# Patient Record
Sex: Female | Born: 1945 | Race: White | Hispanic: No | Smoking: Current every day smoker
Health system: Southern US, Community
[De-identification: ages and names within clinical notes are randomized; demographics above are authoritative.]

## PROBLEM LIST (undated history)

## (undated) DIAGNOSIS — M545 Low back pain, unspecified: Secondary | ICD-10-CM

## (undated) DIAGNOSIS — E039 Hypothyroidism, unspecified: Secondary | ICD-10-CM

## (undated) DIAGNOSIS — I10 Essential (primary) hypertension: Secondary | ICD-10-CM

## (undated) DIAGNOSIS — M79645 Pain in left finger(s): Secondary | ICD-10-CM

## (undated) DIAGNOSIS — K635 Polyp of colon: Secondary | ICD-10-CM

## (undated) DIAGNOSIS — H409 Unspecified glaucoma: Secondary | ICD-10-CM

## (undated) DIAGNOSIS — I493 Ventricular premature depolarization: Secondary | ICD-10-CM

## (undated) DIAGNOSIS — M1712 Unilateral primary osteoarthritis, left knee: Secondary | ICD-10-CM

## (undated) DIAGNOSIS — E041 Nontoxic single thyroid nodule: Secondary | ICD-10-CM

## (undated) DIAGNOSIS — G8929 Other chronic pain: Secondary | ICD-10-CM

## (undated) DIAGNOSIS — K222 Esophageal obstruction: Secondary | ICD-10-CM

## (undated) DIAGNOSIS — K5900 Constipation, unspecified: Secondary | ICD-10-CM

## (undated) DIAGNOSIS — T7840XA Allergy, unspecified, initial encounter: Secondary | ICD-10-CM

## (undated) DIAGNOSIS — Z5189 Encounter for other specified aftercare: Secondary | ICD-10-CM

## (undated) DIAGNOSIS — J449 Chronic obstructive pulmonary disease, unspecified: Secondary | ICD-10-CM

## (undated) DIAGNOSIS — Z9889 Other specified postprocedural states: Secondary | ICD-10-CM

## (undated) DIAGNOSIS — G4730 Sleep apnea, unspecified: Secondary | ICD-10-CM

## (undated) DIAGNOSIS — Z86010 Personal history of colonic polyps: Secondary | ICD-10-CM

## (undated) DIAGNOSIS — H269 Unspecified cataract: Secondary | ICD-10-CM

## (undated) DIAGNOSIS — Z720 Tobacco use: Secondary | ICD-10-CM

## (undated) DIAGNOSIS — R011 Cardiac murmur, unspecified: Secondary | ICD-10-CM

## (undated) DIAGNOSIS — M1990 Unspecified osteoarthritis, unspecified site: Secondary | ICD-10-CM

## (undated) DIAGNOSIS — K219 Gastro-esophageal reflux disease without esophagitis: Secondary | ICD-10-CM

## (undated) DIAGNOSIS — E785 Hyperlipidemia, unspecified: Secondary | ICD-10-CM

## (undated) DIAGNOSIS — R112 Nausea with vomiting, unspecified: Secondary | ICD-10-CM

## (undated) DIAGNOSIS — M810 Age-related osteoporosis without current pathological fracture: Secondary | ICD-10-CM

## (undated) DIAGNOSIS — C801 Malignant (primary) neoplasm, unspecified: Secondary | ICD-10-CM

## (undated) DIAGNOSIS — Z8679 Personal history of other diseases of the circulatory system: Secondary | ICD-10-CM

## (undated) HISTORY — PX: POLYPECTOMY: SHX149

## (undated) HISTORY — PX: FRACTURE SURGERY: SHX138

## (undated) HISTORY — DX: Hypothyroidism, unspecified: E03.9

## (undated) HISTORY — PX: TUBAL LIGATION: SHX77

## (undated) HISTORY — PX: LUMBAR FUSION: SHX111

## (undated) HISTORY — PX: BACK SURGERY: SHX140

## (undated) HISTORY — DX: Sleep apnea, unspecified: G47.30

## (undated) HISTORY — DX: Allergy, unspecified, initial encounter: T78.40XA

## (undated) HISTORY — PX: UPPER GASTROINTESTINAL ENDOSCOPY: SHX188

## (undated) HISTORY — DX: Esophageal obstruction: K22.2

## (undated) HISTORY — PX: ELBOW SURGERY: SHX618

## (undated) HISTORY — DX: Ventricular premature depolarization: I49.3

## (undated) HISTORY — DX: Encounter for other specified aftercare: Z51.89

## (undated) HISTORY — DX: Unspecified glaucoma: H40.9

## (undated) HISTORY — PX: INFUSION PUMP IMPLANTATION: SHX1824

## (undated) HISTORY — DX: Personal history of other diseases of the circulatory system: Z86.79

## (undated) HISTORY — DX: Low back pain: M54.5

## (undated) HISTORY — DX: Personal history of colonic polyps: Z86.010

## (undated) HISTORY — DX: Malignant (primary) neoplasm, unspecified: C80.1

## (undated) HISTORY — DX: Chronic obstructive pulmonary disease, unspecified: J44.9

## (undated) HISTORY — DX: Tobacco use: Z72.0

## (undated) HISTORY — DX: Other chronic pain: G89.29

## (undated) HISTORY — DX: Nontoxic single thyroid nodule: E04.1

## (undated) HISTORY — PX: KNEE ARTHROSCOPY: SHX127

## (undated) HISTORY — PX: JOINT REPLACEMENT: SHX530

## (undated) HISTORY — PX: SPINE SURGERY: SHX786

## (undated) HISTORY — DX: Low back pain, unspecified: M54.50

## (undated) HISTORY — DX: Unilateral primary osteoarthritis, left knee: M17.12

## (undated) HISTORY — DX: Essential (primary) hypertension: I10

## (undated) HISTORY — PX: BREAST BIOPSY: SHX20

## (undated) HISTORY — PX: COLONOSCOPY: SHX174

## (undated) HISTORY — PX: ABDOMINAL HYSTERECTOMY: SHX81

## (undated) HISTORY — DX: Pain in left finger(s): M79.645

## (undated) HISTORY — DX: Polyp of colon: K63.5

## (undated) HISTORY — DX: Age-related osteoporosis without current pathological fracture: M81.0

## (undated) HISTORY — DX: Hyperlipidemia, unspecified: E78.5

## (undated) HISTORY — DX: Unspecified cataract: H26.9

## (undated) HISTORY — PX: EYE SURGERY: SHX253

---

## 1986-07-09 HISTORY — PX: ABDOMINAL HYSTERECTOMY: SHX81

## 1989-03-09 DIAGNOSIS — G4730 Sleep apnea, unspecified: Secondary | ICD-10-CM

## 1989-03-09 HISTORY — PX: INFUSION PUMP IMPLANTATION: SHX1824

## 1989-03-09 HISTORY — DX: Sleep apnea, unspecified: G47.30

## 1989-03-09 HISTORY — PX: ELBOW SURGERY: SHX618

## 1993-07-09 HISTORY — PX: ANKLE SURGERY: SHX546

## 1999-04-25 ENCOUNTER — Other Ambulatory Visit: Admission: RE | Admit: 1999-04-25 | Discharge: 1999-04-25 | Payer: Self-pay | Admitting: Obstetrics and Gynecology

## 2000-01-02 ENCOUNTER — Encounter: Payer: Self-pay | Admitting: Obstetrics and Gynecology

## 2000-01-02 ENCOUNTER — Encounter: Admission: RE | Admit: 2000-01-02 | Discharge: 2000-01-02 | Payer: Self-pay | Admitting: Obstetrics and Gynecology

## 2000-02-27 ENCOUNTER — Encounter: Payer: Self-pay | Admitting: Orthopedic Surgery

## 2000-02-27 ENCOUNTER — Ambulatory Visit (HOSPITAL_COMMUNITY): Admission: RE | Admit: 2000-02-27 | Discharge: 2000-02-27 | Payer: Self-pay | Admitting: Orthopedic Surgery

## 2000-03-13 ENCOUNTER — Ambulatory Visit (HOSPITAL_COMMUNITY): Admission: RE | Admit: 2000-03-13 | Discharge: 2000-03-13 | Payer: Self-pay | Admitting: Orthopedic Surgery

## 2000-03-13 ENCOUNTER — Encounter: Payer: Self-pay | Admitting: Orthopedic Surgery

## 2000-03-29 ENCOUNTER — Ambulatory Visit (HOSPITAL_COMMUNITY): Admission: RE | Admit: 2000-03-29 | Discharge: 2000-03-29 | Payer: Self-pay | Admitting: Orthopedic Surgery

## 2000-03-29 ENCOUNTER — Encounter: Payer: Self-pay | Admitting: Orthopedic Surgery

## 2000-05-27 ENCOUNTER — Other Ambulatory Visit: Admission: RE | Admit: 2000-05-27 | Discharge: 2000-05-27 | Payer: Self-pay | Admitting: Obstetrics and Gynecology

## 2000-09-30 ENCOUNTER — Ambulatory Visit (HOSPITAL_COMMUNITY): Admission: RE | Admit: 2000-09-30 | Discharge: 2000-09-30 | Payer: Self-pay | Admitting: Orthopaedic Surgery

## 2000-10-03 ENCOUNTER — Inpatient Hospital Stay (HOSPITAL_COMMUNITY): Admission: RE | Admit: 2000-10-03 | Discharge: 2000-10-04 | Payer: Self-pay | Admitting: Orthopaedic Surgery

## 2000-12-18 ENCOUNTER — Ambulatory Visit: Admission: RE | Admit: 2000-12-18 | Discharge: 2000-12-18 | Payer: Self-pay | Admitting: Orthopaedic Surgery

## 2001-01-01 ENCOUNTER — Encounter: Admission: RE | Admit: 2001-01-01 | Discharge: 2001-01-01 | Payer: Self-pay | Admitting: *Deleted

## 2001-01-16 ENCOUNTER — Encounter: Admission: RE | Admit: 2001-01-16 | Discharge: 2001-01-16 | Payer: Self-pay | Admitting: Orthopaedic Surgery

## 2001-01-29 ENCOUNTER — Encounter: Admission: RE | Admit: 2001-01-29 | Discharge: 2001-01-29 | Payer: Self-pay | Admitting: Orthopaedic Surgery

## 2001-02-03 ENCOUNTER — Encounter: Payer: Self-pay | Admitting: Obstetrics and Gynecology

## 2001-02-03 ENCOUNTER — Encounter: Admission: RE | Admit: 2001-02-03 | Discharge: 2001-02-03 | Payer: Self-pay | Admitting: Obstetrics and Gynecology

## 2001-06-02 ENCOUNTER — Other Ambulatory Visit: Admission: RE | Admit: 2001-06-02 | Discharge: 2001-06-02 | Payer: Self-pay | Admitting: Obstetrics and Gynecology

## 2001-08-27 ENCOUNTER — Encounter: Payer: Self-pay | Admitting: Endocrinology

## 2001-08-27 ENCOUNTER — Encounter: Admission: RE | Admit: 2001-08-27 | Discharge: 2001-08-27 | Payer: Self-pay | Admitting: Endocrinology

## 2001-09-19 ENCOUNTER — Inpatient Hospital Stay (HOSPITAL_COMMUNITY): Admission: RE | Admit: 2001-09-19 | Discharge: 2001-09-20 | Payer: Self-pay | Admitting: Orthopaedic Surgery

## 2001-10-30 ENCOUNTER — Ambulatory Visit (HOSPITAL_COMMUNITY): Admission: RE | Admit: 2001-10-30 | Discharge: 2001-10-30 | Payer: Self-pay | Admitting: Orthopaedic Surgery

## 2002-02-18 ENCOUNTER — Encounter: Admission: RE | Admit: 2002-02-18 | Discharge: 2002-02-18 | Payer: Self-pay | Admitting: Obstetrics and Gynecology

## 2002-02-18 ENCOUNTER — Encounter: Payer: Self-pay | Admitting: Obstetrics and Gynecology

## 2003-03-30 ENCOUNTER — Encounter: Payer: Self-pay | Admitting: Obstetrics and Gynecology

## 2003-03-30 ENCOUNTER — Encounter: Admission: RE | Admit: 2003-03-30 | Discharge: 2003-03-30 | Payer: Self-pay | Admitting: Obstetrics and Gynecology

## 2003-09-14 ENCOUNTER — Ambulatory Visit (HOSPITAL_COMMUNITY): Admission: RE | Admit: 2003-09-14 | Discharge: 2003-09-14 | Payer: Self-pay | Admitting: Neurosurgery

## 2004-04-20 ENCOUNTER — Ambulatory Visit: Payer: Self-pay | Admitting: Pain Medicine

## 2004-05-02 ENCOUNTER — Ambulatory Visit: Payer: Self-pay | Admitting: Physician Assistant

## 2004-05-09 ENCOUNTER — Ambulatory Visit: Payer: Self-pay | Admitting: Pain Medicine

## 2004-05-24 ENCOUNTER — Ambulatory Visit: Payer: Self-pay | Admitting: Physician Assistant

## 2004-06-28 ENCOUNTER — Ambulatory Visit: Payer: Self-pay | Admitting: Pain Medicine

## 2004-07-27 ENCOUNTER — Ambulatory Visit: Payer: Self-pay | Admitting: Physician Assistant

## 2004-08-28 ENCOUNTER — Ambulatory Visit (HOSPITAL_COMMUNITY): Admission: RE | Admit: 2004-08-28 | Discharge: 2004-08-28 | Payer: Self-pay | Admitting: Gastroenterology

## 2004-08-28 ENCOUNTER — Encounter: Payer: Self-pay | Admitting: Internal Medicine

## 2004-08-28 ENCOUNTER — Encounter (INDEPENDENT_AMBULATORY_CARE_PROVIDER_SITE_OTHER): Payer: Self-pay | Admitting: Specialist

## 2004-08-28 DIAGNOSIS — Z860100 Personal history of colon polyps, unspecified: Secondary | ICD-10-CM

## 2004-08-28 HISTORY — DX: Personal history of colon polyps, unspecified: Z86.0100

## 2004-08-28 HISTORY — DX: Personal history of colonic polyps: Z86.010

## 2004-08-28 LAB — CONVERTED CEMR LAB

## 2004-09-04 ENCOUNTER — Ambulatory Visit: Payer: Self-pay | Admitting: Pain Medicine

## 2004-10-02 ENCOUNTER — Ambulatory Visit: Payer: Self-pay | Admitting: Physician Assistant

## 2004-10-31 ENCOUNTER — Ambulatory Visit: Payer: Self-pay | Admitting: Physician Assistant

## 2004-11-03 ENCOUNTER — Ambulatory Visit (HOSPITAL_BASED_OUTPATIENT_CLINIC_OR_DEPARTMENT_OTHER): Admission: RE | Admit: 2004-11-03 | Discharge: 2004-11-03 | Payer: Self-pay | Admitting: Orthopedic Surgery

## 2004-11-03 ENCOUNTER — Ambulatory Visit (HOSPITAL_COMMUNITY): Admission: RE | Admit: 2004-11-03 | Discharge: 2004-11-03 | Payer: Self-pay | Admitting: Orthopedic Surgery

## 2004-11-30 ENCOUNTER — Ambulatory Visit: Payer: Self-pay | Admitting: Physician Assistant

## 2004-12-28 ENCOUNTER — Ambulatory Visit: Payer: Self-pay | Admitting: Physician Assistant

## 2005-01-25 ENCOUNTER — Ambulatory Visit: Payer: Self-pay | Admitting: Physician Assistant

## 2005-02-19 ENCOUNTER — Ambulatory Visit (HOSPITAL_COMMUNITY): Admission: RE | Admit: 2005-02-19 | Discharge: 2005-02-19 | Payer: Self-pay | Admitting: Neurology

## 2005-02-27 ENCOUNTER — Ambulatory Visit: Payer: Self-pay | Admitting: Physician Assistant

## 2005-03-29 ENCOUNTER — Ambulatory Visit: Payer: Self-pay | Admitting: Physician Assistant

## 2005-04-25 ENCOUNTER — Ambulatory Visit (HOSPITAL_COMMUNITY): Admission: RE | Admit: 2005-04-25 | Discharge: 2005-04-25 | Payer: Self-pay | Admitting: Endocrinology

## 2005-04-25 ENCOUNTER — Ambulatory Visit: Payer: Self-pay | Admitting: Physician Assistant

## 2005-04-27 ENCOUNTER — Ambulatory Visit: Payer: Self-pay | Admitting: Pain Medicine

## 2005-05-08 ENCOUNTER — Ambulatory Visit: Payer: Self-pay | Admitting: Pain Medicine

## 2005-05-23 ENCOUNTER — Ambulatory Visit: Payer: Self-pay | Admitting: Pain Medicine

## 2005-06-04 ENCOUNTER — Ambulatory Visit: Payer: Self-pay | Admitting: Pain Medicine

## 2005-06-04 ENCOUNTER — Inpatient Hospital Stay: Payer: Self-pay | Admitting: Pain Medicine

## 2005-06-19 ENCOUNTER — Ambulatory Visit: Payer: Self-pay | Admitting: Pain Medicine

## 2005-06-20 ENCOUNTER — Ambulatory Visit: Payer: Self-pay | Admitting: Physician Assistant

## 2005-06-28 ENCOUNTER — Ambulatory Visit: Payer: Self-pay | Admitting: Pain Medicine

## 2005-07-11 ENCOUNTER — Ambulatory Visit: Payer: Self-pay | Admitting: Pain Medicine

## 2005-07-25 ENCOUNTER — Ambulatory Visit: Payer: Self-pay | Admitting: Pain Medicine

## 2005-08-06 ENCOUNTER — Ambulatory Visit: Payer: Self-pay | Admitting: Physician Assistant

## 2005-09-24 ENCOUNTER — Ambulatory Visit: Payer: Self-pay | Admitting: Pain Medicine

## 2005-10-16 ENCOUNTER — Ambulatory Visit: Payer: Self-pay | Admitting: Physician Assistant

## 2005-10-22 ENCOUNTER — Ambulatory Visit: Payer: Self-pay | Admitting: Pain Medicine

## 2005-11-02 ENCOUNTER — Other Ambulatory Visit: Admission: RE | Admit: 2005-11-02 | Discharge: 2005-11-02 | Payer: Self-pay | Admitting: Endocrinology

## 2005-11-28 ENCOUNTER — Encounter: Admission: RE | Admit: 2005-11-28 | Discharge: 2005-11-28 | Payer: Self-pay | Admitting: Obstetrics and Gynecology

## 2006-01-25 ENCOUNTER — Ambulatory Visit: Payer: Self-pay | Admitting: Physician Assistant

## 2006-02-18 ENCOUNTER — Ambulatory Visit: Payer: Self-pay | Admitting: Physician Assistant

## 2006-03-01 ENCOUNTER — Ambulatory Visit: Payer: Self-pay | Admitting: Physician Assistant

## 2006-04-19 ENCOUNTER — Ambulatory Visit: Payer: Self-pay | Admitting: Physician Assistant

## 2006-05-09 ENCOUNTER — Ambulatory Visit: Payer: Self-pay | Admitting: Physician Assistant

## 2006-05-21 ENCOUNTER — Ambulatory Visit: Payer: Self-pay | Admitting: Pain Medicine

## 2006-05-28 ENCOUNTER — Ambulatory Visit: Payer: Self-pay | Admitting: Physician Assistant

## 2006-06-17 ENCOUNTER — Ambulatory Visit: Payer: Self-pay | Admitting: Physician Assistant

## 2006-08-28 ENCOUNTER — Ambulatory Visit: Payer: Self-pay | Admitting: Physician Assistant

## 2006-11-13 ENCOUNTER — Ambulatory Visit: Payer: Self-pay | Admitting: Physician Assistant

## 2006-12-05 ENCOUNTER — Encounter: Admission: RE | Admit: 2006-12-05 | Discharge: 2006-12-05 | Payer: Self-pay | Admitting: *Deleted

## 2007-08-12 ENCOUNTER — Ambulatory Visit: Payer: Self-pay | Admitting: Internal Medicine

## 2007-08-12 DIAGNOSIS — E785 Hyperlipidemia, unspecified: Secondary | ICD-10-CM | POA: Insufficient documentation

## 2007-08-12 DIAGNOSIS — I10 Essential (primary) hypertension: Secondary | ICD-10-CM | POA: Insufficient documentation

## 2007-08-12 DIAGNOSIS — M1710 Unilateral primary osteoarthritis, unspecified knee: Secondary | ICD-10-CM | POA: Insufficient documentation

## 2007-08-12 DIAGNOSIS — F17200 Nicotine dependence, unspecified, uncomplicated: Secondary | ICD-10-CM | POA: Insufficient documentation

## 2007-08-12 DIAGNOSIS — E039 Hypothyroidism, unspecified: Secondary | ICD-10-CM | POA: Insufficient documentation

## 2007-08-14 ENCOUNTER — Telehealth: Payer: Self-pay | Admitting: Internal Medicine

## 2007-08-18 LAB — HM MAMMOGRAPHY: HM Mammogram: NORMAL

## 2007-10-20 ENCOUNTER — Ambulatory Visit: Payer: Self-pay | Admitting: Internal Medicine

## 2007-10-20 LAB — CONVERTED CEMR LAB
ALT: 17 units/L (ref 0–35)
AST: 21 units/L (ref 0–37)
Albumin: 3.8 g/dL (ref 3.5–5.2)
Alkaline Phosphatase: 57 units/L (ref 39–117)
BUN: 21 mg/dL (ref 6–23)
Basophils Absolute: 0 10*3/uL (ref 0.0–0.1)
Basophils Relative: 0.5 % (ref 0.0–1.0)
Bilirubin, Direct: 0.1 mg/dL (ref 0.0–0.3)
CO2: 32 meq/L (ref 19–32)
CRP, High Sensitivity: 0.9
Calcium: 8.6 mg/dL (ref 8.4–10.5)
Chloride: 106 meq/L (ref 96–112)
Cholesterol: 165 mg/dL (ref 0–200)
Creatinine, Ser: 0.8 mg/dL (ref 0.4–1.2)
Eosinophils Absolute: 0.1 10*3/uL (ref 0.0–0.7)
Eosinophils Relative: 3.2 % (ref 0.0–5.0)
GFR calc Af Amer: 93 mL/min
GFR calc non Af Amer: 77 mL/min
Glucose, Bld: 99 mg/dL (ref 70–99)
HCT: 34.2 % — ABNORMAL LOW (ref 36.0–46.0)
HDL: 51.9 mg/dL (ref >39.0)
Hemoglobin: 11.4 g/dL — ABNORMAL LOW (ref 12.0–15.0)
LDL Cholesterol: 93 mg/dL (ref 0–99)
Lymphocytes Relative: 35.7 % (ref 12.0–46.0)
MCHC: 33.4 g/dL (ref 30.0–36.0)
MCV: 90.4 fL (ref 78.0–100.0)
Monocytes Absolute: 0.5 10*3/uL (ref 0.1–1.0)
Monocytes Relative: 11.9 % (ref 3.0–12.0)
Neutro Abs: 2 10*3/uL (ref 1.4–7.7)
Neutrophils Relative %: 48.7 % (ref 43.0–77.0)
Platelets: 192 10*3/uL (ref 150–400)
Potassium: 3.6 meq/L (ref 3.5–5.1)
RBC: 3.78 M/uL — ABNORMAL LOW (ref 3.87–5.11)
RDW: 12.7 % (ref 11.5–14.6)
Sodium: 142 meq/L (ref 135–145)
TSH: 2.2 microintl units/mL (ref 0.35–5.50)
Total Bilirubin: 0.5 mg/dL (ref 0.3–1.2)
Total CHOL/HDL Ratio: 3.2
Total Protein: 6.1 g/dL (ref 6.0–8.3)
Triglycerides: 102 mg/dL (ref 0–149)
VLDL: 20 mg/dL (ref 0–40)
WBC: 4.1 10*3/uL — ABNORMAL LOW (ref 4.5–10.5)

## 2007-10-21 ENCOUNTER — Encounter: Payer: Self-pay | Admitting: Internal Medicine

## 2007-10-22 ENCOUNTER — Telehealth: Payer: Self-pay | Admitting: Internal Medicine

## 2007-10-24 ENCOUNTER — Ambulatory Visit: Payer: Self-pay | Admitting: Internal Medicine

## 2007-10-24 DIAGNOSIS — D649 Anemia, unspecified: Secondary | ICD-10-CM | POA: Insufficient documentation

## 2007-11-13 ENCOUNTER — Telehealth: Payer: Self-pay | Admitting: Internal Medicine

## 2007-12-19 ENCOUNTER — Encounter: Admission: RE | Admit: 2007-12-19 | Discharge: 2007-12-19 | Payer: Self-pay | Admitting: Obstetrics and Gynecology

## 2007-12-25 ENCOUNTER — Encounter: Admission: RE | Admit: 2007-12-25 | Discharge: 2007-12-25 | Payer: Self-pay | Admitting: Obstetrics and Gynecology

## 2008-02-24 LAB — CONVERTED CEMR LAB: Pap Smear: NORMAL

## 2008-02-26 ENCOUNTER — Encounter: Admission: RE | Admit: 2008-02-26 | Discharge: 2008-02-26 | Payer: Self-pay | Admitting: Obstetrics and Gynecology

## 2008-05-06 ENCOUNTER — Ambulatory Visit: Payer: Self-pay | Admitting: Internal Medicine

## 2008-05-06 DIAGNOSIS — G4700 Insomnia, unspecified: Secondary | ICD-10-CM | POA: Insufficient documentation

## 2008-05-06 DIAGNOSIS — R1310 Dysphagia, unspecified: Secondary | ICD-10-CM | POA: Insufficient documentation

## 2008-05-06 LAB — CONVERTED CEMR LAB
ALT: 17 units/L (ref 0–35)
AST: 20 units/L (ref 0–37)
BUN: 18 mg/dL (ref 6–23)
Basophils Absolute: 0 10*3/uL (ref 0.0–0.1)
Basophils Relative: 0.5 % (ref 0.0–3.0)
CO2: 33 meq/L — ABNORMAL HIGH (ref 19–32)
Calcium: 9.2 mg/dL (ref 8.4–10.5)
Chloride: 102 meq/L (ref 96–112)
Cholesterol: 196 mg/dL (ref 0–200)
Creatinine, Ser: 0.8 mg/dL (ref 0.4–1.2)
Direct LDL: 78.9 mg/dL
Eosinophils Absolute: 0.1 10*3/uL (ref 0.0–0.7)
Eosinophils Relative: 3.3 % (ref 0.0–5.0)
GFR calc Af Amer: 93 mL/min
GFR calc non Af Amer: 77 mL/min
Glucose, Bld: 92 mg/dL (ref 70–99)
HCT: 35.7 % — ABNORMAL LOW (ref 36.0–46.0)
HDL: 50 mg/dL (ref >39.0)
Hemoglobin: 12.8 g/dL (ref 12.0–15.0)
Lymphocytes Relative: 35.4 % (ref 12.0–46.0)
MCHC: 35.9 g/dL (ref 30.0–36.0)
MCV: 90.2 fL (ref 78.0–100.0)
Monocytes Absolute: 0.5 10*3/uL (ref 0.1–1.0)
Monocytes Relative: 11.8 % (ref 3.0–12.0)
Neutro Abs: 2.3 10*3/uL (ref 1.4–7.7)
Neutrophils Relative %: 49 % (ref 43.0–77.0)
Platelets: 193 10*3/uL (ref 150–400)
Potassium: 4 meq/L (ref 3.5–5.1)
RBC: 3.96 M/uL (ref 3.87–5.11)
RDW: 12.7 % (ref 11.5–14.6)
Sodium: 141 meq/L (ref 135–145)
TSH: 2.16 microintl units/mL (ref 0.35–5.50)
Total CHOL/HDL Ratio: 3.9
Triglycerides: 309 mg/dL (ref 0–149)
VLDL: 62 mg/dL — ABNORMAL HIGH (ref 0–40)
WBC: 4.5 10*3/uL (ref 4.5–10.5)

## 2008-05-26 ENCOUNTER — Ambulatory Visit: Payer: Self-pay | Admitting: Internal Medicine

## 2008-05-26 DIAGNOSIS — K219 Gastro-esophageal reflux disease without esophagitis: Secondary | ICD-10-CM | POA: Insufficient documentation

## 2008-05-26 DIAGNOSIS — E041 Nontoxic single thyroid nodule: Secondary | ICD-10-CM | POA: Insufficient documentation

## 2008-07-14 ENCOUNTER — Ambulatory Visit: Payer: Self-pay | Admitting: Internal Medicine

## 2008-07-29 ENCOUNTER — Telehealth: Payer: Self-pay | Admitting: Internal Medicine

## 2008-08-02 ENCOUNTER — Ambulatory Visit: Payer: Self-pay | Admitting: Internal Medicine

## 2008-08-17 ENCOUNTER — Encounter: Admission: RE | Admit: 2008-08-17 | Discharge: 2008-08-17 | Payer: Self-pay | Admitting: Obstetrics and Gynecology

## 2008-08-27 ENCOUNTER — Ambulatory Visit: Payer: Self-pay | Admitting: Internal Medicine

## 2008-09-13 ENCOUNTER — Ambulatory Visit: Payer: Self-pay | Admitting: Internal Medicine

## 2008-09-13 DIAGNOSIS — M25569 Pain in unspecified knee: Secondary | ICD-10-CM | POA: Insufficient documentation

## 2008-09-13 DIAGNOSIS — L02838 Carbuncle of other sites: Secondary | ICD-10-CM

## 2008-09-13 DIAGNOSIS — L02828 Furuncle of other sites: Secondary | ICD-10-CM | POA: Insufficient documentation

## 2008-09-16 ENCOUNTER — Encounter: Payer: Self-pay | Admitting: Internal Medicine

## 2008-09-17 ENCOUNTER — Encounter: Payer: Self-pay | Admitting: Internal Medicine

## 2008-09-21 LAB — CONVERTED CEMR LAB: Pap Smear: NORMAL

## 2008-09-22 ENCOUNTER — Ambulatory Visit (HOSPITAL_BASED_OUTPATIENT_CLINIC_OR_DEPARTMENT_OTHER): Admission: RE | Admit: 2008-09-22 | Discharge: 2008-09-22 | Payer: Self-pay | Admitting: Orthopedic Surgery

## 2008-09-22 ENCOUNTER — Ambulatory Visit: Payer: Self-pay | Admitting: Diagnostic Radiology

## 2008-09-24 ENCOUNTER — Encounter: Payer: Self-pay | Admitting: Internal Medicine

## 2008-10-22 ENCOUNTER — Ambulatory Visit: Payer: Self-pay | Admitting: Internal Medicine

## 2008-11-05 ENCOUNTER — Telehealth: Payer: Self-pay | Admitting: Internal Medicine

## 2008-11-08 ENCOUNTER — Encounter: Admission: RE | Admit: 2008-11-08 | Discharge: 2008-11-08 | Payer: Self-pay | Admitting: Internal Medicine

## 2008-11-22 ENCOUNTER — Encounter: Payer: Self-pay | Admitting: Internal Medicine

## 2008-11-26 ENCOUNTER — Encounter: Payer: Self-pay | Admitting: Internal Medicine

## 2009-02-07 ENCOUNTER — Telehealth: Payer: Self-pay | Admitting: Internal Medicine

## 2009-02-07 ENCOUNTER — Ambulatory Visit: Payer: Self-pay | Admitting: Internal Medicine

## 2009-02-07 LAB — CONVERTED CEMR LAB
ALT: 17 units/L (ref 0–35)
AST: 20 units/L (ref 0–37)
Albumin: 4.1 g/dL (ref 3.5–5.2)
Alkaline Phosphatase: 76 units/L (ref 39–117)
BUN: 16 mg/dL (ref 6–23)
Bilirubin, Direct: 0.1 mg/dL (ref 0.0–0.3)
CO2: 31 meq/L (ref 19–32)
Calcium: 9 mg/dL (ref 8.4–10.5)
Chloride: 102 meq/L (ref 96–112)
Cholesterol: 146 mg/dL (ref 0–200)
Creatinine, Ser: 0.8 mg/dL (ref 0.4–1.2)
GFR calc non Af Amer: 76.69 mL/min (ref >60)
Glucose, Bld: 102 mg/dL — ABNORMAL HIGH (ref 70–99)
HDL: 51.6 mg/dL (ref >39.00)
LDL Cholesterol: 70 mg/dL (ref 0–99)
Potassium: 2.7 meq/L — CL (ref 3.5–5.1)
Sodium: 142 meq/L (ref 135–145)
TSH: 0.32 microintl units/mL — ABNORMAL LOW (ref 0.35–5.50)
Total Bilirubin: 0.7 mg/dL (ref 0.3–1.2)
Total CHOL/HDL Ratio: 3
Total Protein: 7 g/dL (ref 6.0–8.3)
Triglycerides: 121 mg/dL (ref 0.0–149.0)
VLDL: 24.2 mg/dL (ref 0.0–40.0)

## 2009-02-10 ENCOUNTER — Ambulatory Visit: Payer: Self-pay | Admitting: Internal Medicine

## 2009-02-11 ENCOUNTER — Telehealth: Payer: Self-pay | Admitting: Internal Medicine

## 2009-02-25 LAB — CONVERTED CEMR LAB
BUN: 14 mg/dL (ref 6–23)
CO2: 26 meq/L (ref 19–32)
Calcium: 9.3 mg/dL (ref 8.4–10.5)
Chloride: 106 meq/L (ref 96–112)
Creatinine, Ser: 0.74 mg/dL (ref 0.40–1.20)
Glucose, Bld: 95 mg/dL (ref 70–99)
Potassium: 4 meq/L (ref 3.5–5.3)
Sodium: 141 meq/L (ref 135–145)

## 2009-02-28 ENCOUNTER — Encounter: Admission: RE | Admit: 2009-02-28 | Discharge: 2009-02-28 | Payer: Self-pay | Admitting: Obstetrics and Gynecology

## 2009-03-01 ENCOUNTER — Telehealth: Payer: Self-pay | Admitting: Internal Medicine

## 2009-04-13 ENCOUNTER — Encounter: Admission: RE | Admit: 2009-04-13 | Discharge: 2009-04-13 | Payer: Self-pay | Admitting: Endocrinology

## 2009-04-20 ENCOUNTER — Ambulatory Visit: Payer: Self-pay | Admitting: Diagnostic Radiology

## 2009-04-20 ENCOUNTER — Ambulatory Visit (HOSPITAL_BASED_OUTPATIENT_CLINIC_OR_DEPARTMENT_OTHER): Admission: RE | Admit: 2009-04-20 | Discharge: 2009-04-20 | Payer: Self-pay | Admitting: Family

## 2009-04-20 ENCOUNTER — Ambulatory Visit: Payer: Self-pay | Admitting: Internal Medicine

## 2009-04-20 DIAGNOSIS — J209 Acute bronchitis, unspecified: Secondary | ICD-10-CM | POA: Insufficient documentation

## 2009-04-21 ENCOUNTER — Telehealth: Payer: Self-pay | Admitting: Internal Medicine

## 2009-04-27 ENCOUNTER — Telehealth: Payer: Self-pay | Admitting: Internal Medicine

## 2009-06-01 ENCOUNTER — Emergency Department (HOSPITAL_COMMUNITY): Admission: EM | Admit: 2009-06-01 | Discharge: 2009-06-01 | Payer: Self-pay | Admitting: Emergency Medicine

## 2009-06-08 ENCOUNTER — Ambulatory Visit: Payer: Self-pay | Admitting: Internal Medicine

## 2009-06-08 DIAGNOSIS — R634 Abnormal weight loss: Secondary | ICD-10-CM | POA: Insufficient documentation

## 2009-06-08 LAB — CONVERTED CEMR LAB
BUN: 15 mg/dL (ref 6–23)
BUN: 17 mg/dL (ref 6–23)
CO2: 28 meq/L (ref 19–32)
CO2: 28 meq/L (ref 19–32)
Calcium: 9.1 mg/dL (ref 8.4–10.5)
Calcium: 9.6 mg/dL (ref 8.4–10.5)
Chloride: 101 meq/L (ref 96–112)
Chloride: 102 meq/L (ref 96–112)
Creatinine, Ser: 0.7 mg/dL (ref 0.4–1.2)
Creatinine, Ser: 0.77 mg/dL (ref 0.40–1.20)
GFR calc non Af Amer: 89.38 mL/min (ref >60)
Glucose, Bld: 125 mg/dL — ABNORMAL HIGH (ref 70–99)
Glucose, Bld: 97 mg/dL (ref 70–99)
Potassium: 3.7 meq/L (ref 3.5–5.1)
Potassium: 3.8 meq/L (ref 3.5–5.3)
Sodium: 137 meq/L (ref 135–145)
Sodium: 139 meq/L (ref 135–145)
TSH: 0.37 microintl units/mL (ref 0.35–5.50)
TSH: 0.512 microintl units/mL (ref 0.350–4.500)

## 2009-06-09 ENCOUNTER — Ambulatory Visit (HOSPITAL_BASED_OUTPATIENT_CLINIC_OR_DEPARTMENT_OTHER): Admission: RE | Admit: 2009-06-09 | Discharge: 2009-06-09 | Payer: Self-pay | Admitting: Internal Medicine

## 2009-06-09 ENCOUNTER — Telehealth: Payer: Self-pay | Admitting: Internal Medicine

## 2009-06-09 ENCOUNTER — Ambulatory Visit: Payer: Self-pay | Admitting: Diagnostic Radiology

## 2009-06-28 ENCOUNTER — Ambulatory Visit: Payer: Self-pay | Admitting: Diagnostic Radiology

## 2009-06-28 ENCOUNTER — Ambulatory Visit (HOSPITAL_BASED_OUTPATIENT_CLINIC_OR_DEPARTMENT_OTHER): Admission: RE | Admit: 2009-06-28 | Discharge: 2009-06-28 | Payer: Self-pay | Admitting: Internal Medicine

## 2009-07-01 ENCOUNTER — Telehealth: Payer: Self-pay | Admitting: Internal Medicine

## 2009-07-06 ENCOUNTER — Telehealth: Payer: Self-pay | Admitting: Internal Medicine

## 2009-07-13 ENCOUNTER — Telehealth: Payer: Self-pay | Admitting: Internal Medicine

## 2009-07-21 ENCOUNTER — Telehealth: Payer: Self-pay | Admitting: Internal Medicine

## 2009-07-25 ENCOUNTER — Telehealth: Payer: Self-pay | Admitting: Internal Medicine

## 2009-07-27 ENCOUNTER — Ambulatory Visit: Payer: Self-pay | Admitting: Internal Medicine

## 2009-08-12 ENCOUNTER — Ambulatory Visit: Payer: Self-pay | Admitting: Internal Medicine

## 2009-08-12 ENCOUNTER — Telehealth: Payer: Self-pay | Admitting: Internal Medicine

## 2009-08-15 ENCOUNTER — Encounter: Payer: Self-pay | Admitting: Internal Medicine

## 2009-08-18 ENCOUNTER — Encounter: Admission: RE | Admit: 2009-08-18 | Discharge: 2009-08-18 | Payer: Self-pay | Admitting: Obstetrics and Gynecology

## 2009-08-19 ENCOUNTER — Ambulatory Visit: Payer: Self-pay | Admitting: Internal Medicine

## 2009-08-19 ENCOUNTER — Encounter (INDEPENDENT_AMBULATORY_CARE_PROVIDER_SITE_OTHER): Payer: Self-pay | Admitting: *Deleted

## 2009-08-19 DIAGNOSIS — K5909 Other constipation: Secondary | ICD-10-CM | POA: Insufficient documentation

## 2009-09-06 ENCOUNTER — Encounter: Payer: Self-pay | Admitting: Internal Medicine

## 2009-09-07 ENCOUNTER — Telehealth: Payer: Self-pay | Admitting: Internal Medicine

## 2009-09-08 ENCOUNTER — Telehealth: Payer: Self-pay | Admitting: Internal Medicine

## 2009-09-14 ENCOUNTER — Ambulatory Visit: Payer: Self-pay | Admitting: Internal Medicine

## 2009-09-14 LAB — HM COLONOSCOPY

## 2009-09-20 ENCOUNTER — Encounter: Payer: Self-pay | Admitting: Internal Medicine

## 2009-09-22 ENCOUNTER — Encounter: Admission: RE | Admit: 2009-09-22 | Discharge: 2009-09-22 | Payer: Self-pay | Admitting: Obstetrics and Gynecology

## 2009-09-27 LAB — CONVERTED CEMR LAB: Pap Smear: NORMAL

## 2009-10-03 ENCOUNTER — Telehealth: Payer: Self-pay | Admitting: Internal Medicine

## 2009-10-10 ENCOUNTER — Encounter: Admission: RE | Admit: 2009-10-10 | Discharge: 2009-10-10 | Payer: Self-pay | Admitting: Obstetrics and Gynecology

## 2009-10-14 ENCOUNTER — Telehealth: Payer: Self-pay | Admitting: Internal Medicine

## 2009-11-15 ENCOUNTER — Encounter: Payer: Self-pay | Admitting: Internal Medicine

## 2009-12-06 ENCOUNTER — Telehealth: Payer: Self-pay | Admitting: Internal Medicine

## 2009-12-08 ENCOUNTER — Encounter: Payer: Self-pay | Admitting: Internal Medicine

## 2009-12-08 LAB — CONVERTED CEMR LAB
ALT: 12 units/L (ref 0–35)
AST: 14 units/L (ref 0–37)
Albumin: 4.1 g/dL (ref 3.5–5.2)
Alkaline Phosphatase: 68 units/L (ref 39–117)
BUN: 18 mg/dL (ref 6–23)
Basophils Absolute: 0 10*3/uL (ref 0.0–0.1)
Basophils Relative: 1 % (ref 0–1)
Bilirubin, Direct: 0.1 mg/dL (ref 0.0–0.3)
CO2: 27 meq/L (ref 19–32)
Calcium: 9.3 mg/dL (ref 8.4–10.5)
Chloride: 103 meq/L (ref 96–112)
Creatinine, Ser: 0.72 mg/dL (ref 0.40–1.20)
Eosinophils Absolute: 0 10*3/uL (ref 0.0–0.7)
Eosinophils Relative: 1 % (ref 0–5)
Free T4: 1.5 ng/dL (ref 0.80–1.80)
Glucose, Bld: 84 mg/dL (ref 70–99)
HCT: 35.5 % — ABNORMAL LOW (ref 36.0–46.0)
Hemoglobin: 11.7 g/dL — ABNORMAL LOW (ref 12.0–15.0)
Indirect Bilirubin: 0.3 mg/dL (ref 0.0–0.9)
Lymphocytes Relative: 41 % (ref 12–46)
Lymphs Abs: 1.5 10*3/uL (ref 0.7–4.0)
MCHC: 33 g/dL (ref 30.0–36.0)
MCV: 91 fL (ref 78.0–100.0)
Monocytes Absolute: 0.4 10*3/uL (ref 0.1–1.0)
Monocytes Relative: 10 % (ref 3–12)
Neutro Abs: 1.8 10*3/uL (ref 1.7–7.7)
Neutrophils Relative %: 47 % (ref 43–77)
Platelets: 210 10*3/uL (ref 150–400)
Potassium: 3.6 meq/L (ref 3.5–5.3)
RBC: 3.9 M/uL (ref 3.87–5.11)
RDW: 13.1 % (ref 11.5–15.5)
Sodium: 138 meq/L (ref 135–145)
TSH: 1.079 microintl units/mL (ref 0.350–4.500)
Total Bilirubin: 0.4 mg/dL (ref 0.3–1.2)
Total Protein: 5.9 g/dL — ABNORMAL LOW (ref 6.0–8.3)
WBC: 3.7 10*3/uL — ABNORMAL LOW (ref 4.0–10.5)

## 2009-12-15 ENCOUNTER — Ambulatory Visit: Payer: Self-pay | Admitting: Internal Medicine

## 2009-12-15 DIAGNOSIS — J984 Other disorders of lung: Secondary | ICD-10-CM | POA: Insufficient documentation

## 2010-03-01 ENCOUNTER — Encounter: Admission: RE | Admit: 2010-03-01 | Discharge: 2010-03-01 | Payer: Self-pay | Admitting: Obstetrics and Gynecology

## 2010-05-16 ENCOUNTER — Telehealth: Payer: Self-pay | Admitting: Internal Medicine

## 2010-05-26 ENCOUNTER — Ambulatory Visit: Payer: Self-pay | Admitting: Internal Medicine

## 2010-05-26 DIAGNOSIS — F4322 Adjustment disorder with anxiety: Secondary | ICD-10-CM | POA: Insufficient documentation

## 2010-05-29 ENCOUNTER — Telehealth: Payer: Self-pay | Admitting: Internal Medicine

## 2010-06-23 ENCOUNTER — Telehealth: Payer: Self-pay | Admitting: Internal Medicine

## 2010-06-23 ENCOUNTER — Ambulatory Visit (HOSPITAL_BASED_OUTPATIENT_CLINIC_OR_DEPARTMENT_OTHER)
Admission: RE | Admit: 2010-06-23 | Discharge: 2010-06-23 | Payer: Self-pay | Source: Home / Self Care | Attending: Internal Medicine | Admitting: Internal Medicine

## 2010-06-30 ENCOUNTER — Emergency Department (HOSPITAL_BASED_OUTPATIENT_CLINIC_OR_DEPARTMENT_OTHER)
Admission: EM | Admit: 2010-06-30 | Discharge: 2010-06-30 | Payer: Self-pay | Source: Home / Self Care | Admitting: Internal Medicine

## 2010-06-30 ENCOUNTER — Telehealth: Payer: Self-pay | Admitting: Internal Medicine

## 2010-07-09 HISTORY — PX: THYROIDECTOMY: SHX17

## 2010-07-09 HISTORY — PX: FOOT SURGERY: SHX648

## 2010-07-09 HISTORY — PX: CERVICAL SPINE SURGERY: SHX589

## 2010-07-11 ENCOUNTER — Encounter: Payer: Self-pay | Admitting: Internal Medicine

## 2010-07-11 LAB — CONVERTED CEMR LAB
Albumin ELP: 61.5 % (ref 55.8–66.1)
Alpha-1-Globulin: 5.3 % — ABNORMAL HIGH (ref 2.9–4.9)
Alpha-2-Globulin: 11.1 % (ref 7.1–11.8)
BUN: 14 mg/dL (ref 6–23)
Basophils Absolute: 0 10*3/uL (ref 0.0–0.1)
Basophils Relative: 1 % (ref 0–1)
Beta Globulin: 5.7 % (ref 4.7–7.2)
CO2: 27 meq/L (ref 19–32)
Calcium: 10 mg/dL (ref 8.4–10.5)
Chloride: 102 meq/L (ref 96–112)
Creatinine, Ser: 0.74 mg/dL (ref 0.40–1.20)
Eosinophils Absolute: 0.1 10*3/uL (ref 0.0–0.7)
Eosinophils Relative: 2 % (ref 0–5)
Gamma Globulin: 11.2 % (ref 11.1–18.8)
Glucose, Bld: 88 mg/dL (ref 70–99)
HCT: 40.4 % (ref 36.0–46.0)
Hemoglobin: 13.4 g/dL (ref 12.0–15.0)
IgA: 163 mg/dL (ref 68–378)
IgG (Immunoglobin G), Serum: 845 mg/dL (ref 694–1618)
IgM, Serum: 141 mg/dL (ref 60–263)
Lymphocytes Relative: 38 % (ref 12–46)
Lymphs Abs: 1.6 10*3/uL (ref 0.7–4.0)
MCHC: 33.2 g/dL (ref 30.0–36.0)
MCV: 91.4 fL (ref 78.0–100.0)
Monocytes Absolute: 0.4 10*3/uL (ref 0.1–1.0)
Monocytes Relative: 10 % (ref 3–12)
Neutro Abs: 2.1 10*3/uL (ref 1.7–7.7)
Neutrophils Relative %: 49 % (ref 43–77)
Platelets: 262 10*3/uL (ref 150–400)
Potassium: 3.6 meq/L (ref 3.5–5.3)
RBC: 4.42 M/uL (ref 3.87–5.11)
RDW: 13.5 % (ref 11.5–15.5)
Sodium: 139 meq/L (ref 135–145)
TSH: 0.863 microintl units/mL (ref 0.350–4.500)
Total Protein, Serum Electrophoresis: 7.1 g/dL (ref 6.0–8.3)
WBC: 4.2 10*3/uL (ref 4.0–10.5)

## 2010-07-18 ENCOUNTER — Ambulatory Visit
Admission: RE | Admit: 2010-07-18 | Discharge: 2010-07-18 | Payer: Self-pay | Source: Home / Self Care | Attending: Internal Medicine | Admitting: Internal Medicine

## 2010-07-30 ENCOUNTER — Encounter: Payer: Self-pay | Admitting: Obstetrics and Gynecology

## 2010-08-08 NOTE — Progress Notes (Signed)
Summary: Temazepam Refill  Phone Note Refill Request Message from:  Patient on October 14, 2009 8:36 AM  Refills Requested: Medication #1:  TEMAZEPAM 30 MG CAPS Take 1 capsule by mouth at bedtime   Dosage confirmed as above?Dosage Confirmed   Brand Name Necessary? No   Supply Requested: 3 months send to [facility]    Method Requested: Electronic Next Appointment Scheduled: 12-08-09 830 lab Initial call taken by: Roselle Locus,  October 14, 2009 8:37 AM  Follow-up for Phone Call        spoke with patient, she is requesting refill to [facility]  Follow-up by: Glendell Docker CMA,  October 14, 2009 8:45 AM  Additional Follow-up for Phone Call Additional follow up Details #1::        ok to refill - 3 month supply with 1 refill Additional Follow-up by: D. Thomos Lemons DO,  October 16, 2009 8:16 PM    Additional Follow-up for Phone Call Additional follow up Details #2::    Rx printed and faxed to  [facility] Follow-up by: Glendell Docker CMA,  October 17, 2009 8:22 AM  Prescriptions: TEMAZEPAM 30 MG CAPS (TEMAZEPAM) Take 1 capsule by mouth at bedtime  #90 x 1   Entered by:   Glendell Docker CMA   Authorized by:   D. Thomos Lemons DO   Signed by:   Glendell Docker CMA on 10/17/2009   Method used:   Printed then faxed to ...       [facility]* (mail-order)             ,          Ph: [phone]       Fax: [phone]   RxID:   [id number]   Appended Document: Temazepam Refill-Reprint RX Prescriptions: TEMAZEPAM 30 MG CAPS (TEMAZEPAM) Take 1 capsule by mouth at bedtime  #90 x 1   Entered by:   Glendell Docker CMA   Authorized by:   D. Thomos Lemons DO   Signed by:   Glendell Docker CMA on 10/17/2009   Method used:   Printed then faxed to ...       [facility]* (retail)       [address]       [place], [place]  [id number]       Ph: [phone]       Fax: [phone]   RxID:   [id number]  Rx has been telephone to [facility] Pharmacist Larey Seat CMA  October 17, 2009 8:28 AM

## 2010-08-08 NOTE — Progress Notes (Signed)
----   Converted from flag ---- ---- 10/22/2007 7:19 AM, Jacques Navy MD wrote: please call patient: CRP 0.9 is very good = low risk.  Thanks ------------------------------  Lf mess for pt

## 2010-08-08 NOTE — Letter (Signed)
   [place] [facility] [address] [place], [place]  [address]  [place] Phone: [phone]      May 06, 2008   Joan [name] [address] [place], [place] [address]  RE:  LAB RESULTS  Dear  [name],  The following is an interpretation of your most recent lab tests.  Please take note of any instructions provided or changes to medications that have resulted from your lab work.  ELECTROLYTES:  Good - no changes needed  KIDNEY FUNCTION TESTS:  Good - no changes needed  LIPID PANEL:  Fair - review at your next visit Triglyceride: 309   Cholesterol: 196   LDL: DEL   HDL: 50.0   Chol/HDL%:  3.9 CALC  THYROID STUDIES:  Thyroid studies normal TSH: 2.16     CBC:  Good - no changes needed  Your triglycerides were high but blood work obtained non fasting.  Please continue taking simvastatin as directed.   Sincerely Yours,    Dr. Thomos Lemons

## 2010-08-08 NOTE — Consult Note (Signed)
Summary: [facility]  [facility]   Imported By: Lanelle Bal 09/30/2008 13:12:38  _____________________________________________________________________  External Attachment:    Type:   Image     Comment:   External Document

## 2010-08-08 NOTE — Assessment & Plan Note (Signed)
Summary: NEW/BCBS /NWS   Vital Signs:  Patient Profile:   65 Years Old Female Height:     65.5 inches Weight:      153.38 pounds BMI:     25.23 Temp:     97.4 degrees F oral Pulse rate:   87 / minute BP sitting:   116 / 81  (right arm)  Vitals Entered By: Glendell Docker (August 12, 2007 1:34 PM)                 Chief Complaint:  NEW PATIENT TO ESTABLISH.  History of Present Illness: 65 year old female here to establish primary care.  She was previously followed by [facility] physicians.  She has complex medical history.  He has had difficulty with hypotension with current BP meds.  She is on ACE and HCTZ.  She reduced her dose of lisiopril to 1/4 tab of 20mg  every other day for a while before discontinuing.  Current Allergies (reviewed today): ! NEURONTIN  Past Medical History:    Chronic Low Back pain - followed by pain mgt - Dr. Venia Carbon    Hypertension    Hyperlipidemia    Left knee advanced osteoarthritis    Tobacco Abuse    Hypothyroidism    History of Depression    History of Benign arrythmia (Cardiologist - Traci Turner)    Esophageal stricture  Past Surgical History:    4 lumbar surgeries.      Lumbar fusion and rods    Caesarean section    Left ankle surgery 1995    History if implantable morphine pump    Hysterectomy   Family History:    Father deceased age 62 - pancreatic cancer    Mother deceased age 33 - DM II, Htn, CAD    Sister with diabetes   Risk Factors:  Mammogram History:     Date of Last Mammogram:  10/28/2006    Results:  normal   Colonoscopy History:     Date of Last Colonoscopy:  10/15/2005    Results:  normal   PAP Smear History:     Date of Last PAP Smear:  08/28/2004    Results:  Done     Physical Exam  General:     alert, well-developed, and well-nourished.  appears older than stated age Head:     normocephalic and atraumatic.   Eyes:     vision grossly intact, pupils equal, pupils round, and pupils reactive to  light.   Ears:     R ear normal and L ear normal.   Mouth:     Oral mucosa and oropharynx without lesions or exudates.  Neck:     supple and no carotid bruits.   Lungs:     normal respiratory effort and normal breath sounds.   Heart:     normal rate, regular rhythm, and no murmur.   Abdomen:     soft, non-tender, no hepatomegaly, and no splenomegaly.   Pulses:     dorsalis pedis and posterior tibial pulses are full and equal bilaterally Extremities:     No lower extremity edema  Neurologic:     alert & oriented X3 and cranial nerves II-XII intact.   Psych:     Cognition and judgment appear intact. Alert and cooperative with normal attention span and concentration.     Impression & Recommendations:  Problem # 1:  HYPERTENSION (ICD-401.9) She has been on and off  lisinopril.  She reports episode of hypotension with  10 mg of lisinopril.  DC lisinopril.  Her updated medication list for this problem includes:    Hydrochlorothiazide 25 Mg Tabs (Hydrochlorothiazide) .Marland Kitchen... Take 1 tablet by mouth once a day  BP today: 116/81   Problem # 2:  TOBACCO ABUSE (ICD-305.1) I strongly urged tobaaco cessation.  She is interested in trying either nicotine patch or nicotrol inhaler.    Problem # 3:  HYPERLIPIDEMIA (ICD-272.4) She is tolerating simvastatin.  Monitor labs. Her updated medication list for this problem includes:    Zocor 20 Mg Tabs (Simvastatin) .Marland Kitchen... Take 1 tablet by mouth once a day   Problem # 4:  DEGENERATIVE JOINT DISEASE, LEFT KNEE (ICD-715.96) She is using knee brace.  She is considering knee replacement.  Her updated medication list for this problem includes:    Hydrocodone-acetaminophen 10-325 Mg Tabs (Hydrocodone-acetaminophen) ..... One tablet by mouth every 6 hours as needed    Carisoprodol 350 Mg Tabs (Carisoprodol) ..... One tablet by mouth every 8 hours as needed    Skelaxin 800 Mg Tabs (Metaxalone) .Marland Kitchen... Take 1 tablet by mouth once a day as needed    Problem # 5:  HYPOTHYROIDISM (ICD-244.9) Refilled synthroid.  Check TSH. Her updated medication list for this problem includes:    Synthroid 100 Mcg Tabs (Levothyroxine sodium) .Marland Kitchen... Take 1 tablet by mouth once a day   Complete Medication List: 1)  Hydrochlorothiazide 25 Mg Tabs (Hydrochlorothiazide) .... Take 1 tablet by mouth once a day 2)  Estrace 1 Mg Tabs (Estradiol) .... Take 1 tablet by mouth once a day 3)  Synthroid 100 Mcg Tabs (Levothyroxine sodium) .... Take 1 tablet by mouth once a day 4)  Hydrocodone-acetaminophen 10-325 Mg Tabs (Hydrocodone-acetaminophen) .... One tablet by mouth every 6 hours as needed 5)  Zocor 20 Mg Tabs (Simvastatin) .... Take 1 tablet by mouth once a day 6)  Metanx 2.8-25-2 Mg Tabs (L-methylfolate-b6-b12) .... Take 1 tablet by mouth once a day 7)  Bisacodyl Ec 5 Mg Tbec (Bisacodyl) .... Take 1 tablet by mouth once a day 8)  Carisoprodol 350 Mg Tabs (Carisoprodol) .... One tablet by mouth every 8 hours as needed 9)  Skelaxin 800 Mg Tabs (Metaxalone) .... Take 1 tablet by mouth once a day as needed 10)  Voltaren 1 % Gel (Diclofenac sodium) .... Apply to affected area four times a day as needed  Other Orders: Pneumococcal Vaccine (45409) Admin 1st Vaccine (81191)   Patient Instructions: 1)  Please schedule a follow-up appointment in 2 months. 2)  BMP prior to visit, ICD-9: 401.9 3)  Hepatic Panel prior to visit, ICD-9: 272.4 4)  Lipid Panel prior to visit, ICD-9: 272.4 5)  TSH prior to visit, ICD-9: 244.90 6)  CBC w/ Diff prior to visit, ICD-9: 401.9 7)  CRP:  272.4 8)  Please return for lab work one (1) week before your next appointment.  (Fasting) 9)  Ask insurance company about nicotrol inhaler.    Prescriptions: ZOCOR 20 MG  TABS (SIMVASTATIN) Take 1 tablet by mouth once a day  #90 x 3   Entered and Authorized by:   Dondra Spry DO   Signed by:   Dondra Spry DO on 08/12/2007   Method used:   Print then Give to Patient   RxID:    [id number] SYNTHROID 100 MCG  TABS (LEVOTHYROXINE SODIUM) Take 1 tablet by mouth once a day  #90 x 3   Entered and Authorized by:   Dondra Spry DO   Signed  by:   Dondra Spry DO on 08/12/2007   Method used:   Print then Give to Patient   RxID:   [id number] HYDROCHLOROTHIAZIDE 25 MG  TABS (HYDROCHLOROTHIAZIDE) Take 1 tablet by mouth once a day  #90 x 3   Entered and Authorized by:   Dondra Spry DO   Signed by:   Dondra Spry DO on 08/12/2007   Method used:   Print then Give to Patient   RxID:   [id number]  ]  Preventive Care Screening  Last Flu Shot:    Date:  04/28/2007    Results:  given   Mammogram:    Date:  10/28/2006    Results:  normal   Colonoscopy:    Date:  10/15/2005    Results:  normal   Pap Smear:    Date:  08/28/2004    Results:  Done    Pneumovax Vaccine    Vaccine Type: Pneumovax    Site: right deltoid    Mfr: Merck    Dose: 0.5 ml    Route: IM    Given by: Glendell Docker    Exp. Date: 08/01/2008    Lot #: [id number]    VIS given: 02/04/96 version given August 12, 2007.

## 2010-08-08 NOTE — Progress Notes (Signed)
Summary: Chantix Clarification  Phone Note From Pharmacy   Caller: Dara [facility] Pharmacist Call For: [phone]  Summary of Call: [facility] is calling for rx clarification on Chantix, they are wanting to make certain that patient is not taking medication to treat depression because it is noted in her profile and needed to make certain the way the rx is written is how the doctor would like the medication prescirbed.  Referenece # [id number] Initial call taken by: Glendell Docker CMA,  July 21, 2009 11:41 AM  Follow-up for Phone Call        no not for depression.  it is for smoking cessation.   Follow-up by: D. Thomos Lemons DO,  July 21, 2009 5:49 PM  Additional Follow-up for Phone Call Additional follow up Details #1::        pharmacist states the medication per manufactures should be 1/2mg  for 3 days then 1/2 two times a day for 4 days  then 1 mg two times a day, [facility] would like to know if the rx could be re-written with these directions Additional Follow-up by: Glendell Docker CMA,  July 22, 2009 8:23 AM    Additional Follow-up for Phone Call Additional follow up Details #2::    Pt could not tolerate 1 mg of chantix due to bad dreams.  Pt can only tolerate lower dose.  Please fill as directed Follow-up by: D. Thomos Lemons DO,  July 22, 2009 12:27 PM  Additional Follow-up for Phone Call Additional follow up Details #3:: Details for Additional Follow-up Action Taken: pharmacist Onalee Hua advised to fill rx as directed Additional Follow-up by: Glendell Docker CMA,  July 22, 2009 4:58 PM

## 2010-08-08 NOTE — Miscellaneous (Signed)
Summary: Mammogram  Clinical Lists Changes  Observations: Added new observation of MAMMOGRAM: abnormal-follow up in 6-9 months (09/17/2008 9:23)      Preventive Care Screening  Mammogram:    Date:  09/17/2008    Results:  abnormal-follow up in 6-9 months

## 2010-08-08 NOTE — Assessment & Plan Note (Signed)
Summary: DYSPHAGIA/YF   History of Present Illness Visit Type: consult Primary GI MD: Stan Head MD [facility] Primary Provider: Dondra Spry DO Chief Complaint:  painful dysphagia-solids, some liquid.  History of Present Illness:   65 year-old white woman with dysphagia, Dr. Artist Pais requests consultation.   Several yr hx of intermittent dysphagia to solids like biscuits and hamburgers, even liquids. Had a test 3 yrs ago sounds like barium swallow, was to see gastroenterologist but put that off due to other medical problems. On omeprazole 40 mg daily with good control of heartburn. Has been on that x 6 mos. She has had a goiter for a # of years, treated with Synthroid suppressive therapy. Dr. Talmage Nap has had her undergo an [place] and says no impingement of esophagus.  GI ROS + for costipation and hemorrhoids All other GI ROS negative.  She also has had alot of chronic back pain and surgeries.  Prior EGD and colonoscopy 3 yrs ago (Dr. Josefa Half) she thinks. Two colon adenomatous polyps.           Colonoscopy  Procedure date:  08/28/2004  Findings:      Two small adenomatous polyps Dr. Danise Edge [facility]  Comments:      Repeat colonoscopy in 5 years.   EGD  Procedure date:  08/28/2004  Findings:      Findings: Normal  Location: [facility]  Dr. Josefa Half  Procedures Next Due Date:    Colonoscopy: 09/2009     Prior Medications Reviewed Using: List Brought by Patient  Updated Prior Medication List: HYDROCHLOROTHIAZIDE 25 MG  TABS (HYDROCHLOROTHIAZIDE) Take 1 tablet by mouth once a day ESTRACE 1 MG  TABS (ESTRADIOL) Take 1 tablet by mouth once a day SYNTHROID 100 MCG  TABS (LEVOTHYROXINE SODIUM) Take 1 tablet by mouth once a day HYDROCODONE-ACETAMINOPHEN 10-325 MG  TABS (HYDROCODONE-ACETAMINOPHEN) one tablet by mouth every 6 hours as needed ZOCOR 20 MG  TABS (SIMVASTATIN) Take 1 tablet by mouth once a day BISACODYL EC 5 MG  TBEC (BISACODYL) Take 1 tablet by  mouth once a day CARISOPRODOL 350 MG  TABS (CARISOPRODOL) one tablet by mouth every 8 hours as needed VOLTAREN 1 %  GEL (DICLOFENAC SODIUM) apply to affected area four times a day as needed NICOTROL 10 MG  INHA (NICOTINE) use 6 - 10 cartridges per day OMEPRAZOLE 20 MG  CPDR (OMEPRAZOLE) 2 by mouth once daily ac AMITRIPTYLINE HCL 10 MG TABS (AMITRIPTYLINE HCL) one by mouth at bedtime prn  Current Allergies (reviewed today): ! NEURONTIN  Past Medical History:    Reviewed history from 05/06/2008 and no changes required:       Chronic Low Back pain - followed by pain mgt - Dr. Venia Carbon       Hypertension       Hyperlipidemia       Left knee advanced osteoarthritis       Tobacco Abuse       Hypothyroidism       History of Depression       History of Benign arrythmia (Cardiologist - Armanda Magic)       Esophageal stricture        Sleep Apnea  Past Surgical History:    Reviewed history from 05/06/2008 and no changes required:       4 lumbar surgeries.         Lumbar fusion and rods       Caesarean section       Left ankle  surgery 1995       History if implantable morphine pump       Hysterectomy           Family History:    Reviewed history from 08/12/2007 and no changes required:       Father deceased age 39 - pancreatic cancer       Mother deceased age 8 - DM II, Htn, CAD       Sister with diabetes  Social History:    Reviewed history from 05/06/2008 and no changes required:       Divorced       Current Smoker  1 ppd -  20 yrs       Alcohol use-no   Risk Factors:  Colonoscopy History:     Date of Last Colonoscopy:  08/28/2004    Results:  Two small adenomatous polyps Dr. Danise Edge [facility]     Vital Signs:  Patient Profile:   65 Years Old Female Height:     65.5 inches Weight:      159.13 pounds BMI:     26.17 Pulse rate:   88 / minute Pulse rhythm:   regular BP sitting:   126 / 70  (right arm) Cuff size:   regular  Vitals Entered By: Christie Nottingham  CMA (May 26, 2008 3:00 PM)                  Physical Exam  General:     Well developed, well nourished, no acute distress. Eyes:     PERRLA, no icterus. Mouth:     No deformity or lesions, dentition normal. Neck:     thyromegaly l>r Lungs:     Clear throughout to auscultation. Heart:     Regular rate and rhythm; no murmurs, rubs,  or bruits. Abdomen:     Soft, nontender and nondistended. No masses, hepatosplenomegaly or hernias noted. Normal bowel sounds. Extremities:     No clubbing, cyanosis, edema or deformities noted. Neurologic:     Alert and  oriented x4; Cervical Nodes:     No significant cervical or supraclavicular adenopathy.  Psych:     Alert and cooperative. Normal mood and affect.    Impression & Recommendations:  Problem # 1:  DYSPHAGIA UNSPECIFIED (ICD-787.20) Assessment: Comment Only She needs EGD ewith possible esopphageal dilation, wants to see costs and may do in Jan due to salary cut in 2009 and other needs/deductibles. We discussed risks, benefita and ppossibility of cancer causing her problem though thought to be unlikely. Also discussed alternatives like barium studies but agreed this was best approach. Orders: EGD (EGD)   Problem # 2:  GERD (ICD-530.81) Assessment: Unchanged Continue current therapy.  Problem # 3:  GOITER, UNSPECIFIED (ICD-240.9) Assessment: Comment Only Do not think this is large enough to cause dysphagia.  Problem # 4:  COLONIC POLYPS, ADENOMATOUS, HX OF (ICD-V12.72) Assessment: Comment Only reviewed results of 2006 colonoscopy and planned recall 2/11   Patient Instructions: 1)  Chew your food carefully and avoid foods that cause swallowing problems 2)  If you have not heard from [place] about the cost estimate of your endoscopy after 3 weeks then call us back 3)  Remeber taht warm or room temp liquids cause less swallowing difficulty than cold ones    ]  ]

## 2010-08-08 NOTE — Assessment & Plan Note (Signed)
Summary: WEIGHT LOSS   History of Present Illness Visit Type: Follow-up Visit Primary GI MD: Stan Head MD [facility] Primary Provider: Dondra Spry DO Requesting Provider: Dondra Spry DO Chief Complaint: Constipation, and weight loss  History of Present Illness:   65 yo white woman known to me from prior evaluation and treatment of dysphagia (esophageal stenosis dilated) and also has a history of adenomatous colon polyps (2006 - Dr. Laural Benes). She  has lost 30+ pounds she believes related to change in thyroid therapy, increase in Spring 2010. diet pattern same exept stopped diet sodas Chronically constipated on narcotics for back pain bisacodyl every day helps relieve  still has some dysphagia with food stopping but beter with weight loss, dilation in 2010 did not really change things careful chewing and small bites help in process of quitting smoking   GI Review of Systems    Reports weight loss.   Weight loss of 35 pounds over 7 months .   Denies abdominal pain, acid reflux, belching, bloating, chest pain, dysphagia with liquids, dysphagia with solids, heartburn, loss of appetite, nausea, vomiting, vomiting blood, and  weight gain.      Reports constipation.     Denies anal fissure, black tarry stools, change in bowel habit, diarrhea, diverticulosis, fecal incontinence, heme positive stool, hemorrhoids, irritable bowel syndrome, jaundice, light color stool, liver problems, rectal bleeding, and  rectal pain.     Current Medications (verified): 1)  Estrace 1 Mg  Tabs (Estradiol) .... Take 1/2  Tablet By Mouth Once A Day 2)  Levothyroxine Sodium 125 Mcg Tabs (Levothyroxine Sodium) .... One By Mouth Qd 3)  Hydrocodone-Acetaminophen 10-325 Mg  Tabs (Hydrocodone-Acetaminophen) .... One Tablet By Mouth Five Times A Day 4)  Simvastatin 20 Mg Tabs (Simvastatin) .... Take 1 Tablet By Mouth Once A Day 5)  Bisacodyl Ec 5 Mg  Tbec (Bisacodyl) .... Take 1 Tablet By Mouth Once A Day 6)   Carisoprodol 350 Mg  Tabs (Carisoprodol) .... One Tablet By Mouth Every 8 Hours As Needed 7)  Voltaren 1 %  Gel (Diclofenac Sodium) .... Apply To Affected Area Four Times A Day As Needed 8)  Omeprazole 20 Mg  Cpdr (Omeprazole) .... 2 By Mouth Two Times A Day 9)  Temazepam 30 Mg Caps (Temazepam) .... One By Mouth Qhs 10)  Hydrochlorothiazide 25 Mg Tabs (Hydrochlorothiazide) .... Take 1 Tablet By Mouth Once A Day (Patient To Verify Dosage) 11)  Klor-Con M20 20 Meq Cr-Tabs (Potassium Chloride Crys Cr) .... Take 1 Tablet By Mouth Two Times A Day 12)  Chantix 0.5 Mg Tabs (Varenicline Tartrate) .... One By Mouth Once Daily X 3 Days, Then One By Mouth Bid  Allergies (verified): 1)  ! Neurontin 2)  Maxzide-25 (Triamterene-Hctz)  Past History:  Past Medical History: Reviewed history from 07/27/2009 and no changes required. Chronic Low Back pain - followed by pain mgt - Dr. Venia Carbon Hypertension Hyperlipidemia Left knee advanced osteoarthritis  Tobacco Abuse  Hypothyroidism History of Depression   History of Benign arrythmia (Cardiologist - Traci Turner) Esophageal stricture  Sleep Apnea      Past Surgical History: Reviewed history from 07/27/2009 and no changes required. 4 lumbar surgeries.   Lumbar fusion and rods   Caesarean section Left ankle surgery 1995 History if implantable morphine pump  Partial Hysterectomy     Family History: Father deceased age 48 - pancreatic cancer Mother deceased age 66 - DM II, Htn, CAD Sister with diabetes     No FH of  Colon Cancer:  Social History: Reviewed history from 07/27/2009 and no changes required. Divorced Current Smoker  1 ppd -  20 yrs   Alcohol use-no      Review of Systems  The patient denies allergy/sinus, anemia, anxiety-new, arthritis/joint pain, back pain, blood in urine, breast changes/lumps, change in vision, confusion, cough, coughing up blood, depression-new, fainting, fatigue, fever, headaches-new, hearing problems,  heart murmur, heart rhythm changes, itching, menstrual pain, muscle pains/cramps, night sweats, nosebleeds, pregnancy symptoms, shortness of breath, skin rash, sleeping problems, sore throat, swelling of feet/legs, swollen lymph glands, thirst - excessive , urination - excessive , urination changes/pain, urine leakage, vision changes, and voice change.    Vital Signs:  Patient profile:   65 year old female Height:      65.5 inches Weight:      127 pounds BMI:     20.89 BSA:     1.64 Pulse rate:   72 / minute Pulse rhythm:   regular BP sitting:   124 / 68  (left arm) Cuff size:   regular  Vitals Entered By: Ok Anis CMA (August 19, 2009 1:52 PM)  Physical Exam  General:  alert, well-developed, and well-nourished.   Eyes:  anicteric Lungs:  Clear throughout to auscultation. Heart:  Regular rate and rhythm; no murmurs, rubs,  or bruits. Abdomen:  soft, non-tender, no masses, no hepatomegaly, and no splenomegaly.     Impression & Recommendations:  Problem # 1:  WEIGHT LOSS, ABNORMAL (ICD-783.21) New problem to me. I think likely due to thyroid medication change.   Problem # 2:  CONSTIPATION, DRUG INDUCED (ICD-564.09) Assessment: New Related to chronic narcotics, managed with bisacodyl successfully.  Problem # 3:  COLONIC POLYPS, ADENOMATOUS, HX OF (ICD-V12.72) Assessment: Unchanged 2 adenomas in 2006 (Dr. Laural Benes)  Orders: Colonoscopy (Colon) Risks, benefits,and indications of endoscopic procedure(s) were reviewed with the patient and all questions answered.  Problem # 4:  SCREENING, COLON CANCER (ICD-V76.51) Assessment: Comment Only  high-rsk due to prior pols\yps  Orders: Colonoscopy (Colon) Risks, benefits,and indications of endoscopic procedure(s) were reviewed with the patient and all questions answered.  Problem # 5:  DYSPHAGIA UNSPECIFIED (ICD-787.20) Assessment: Unchanged she wants to monitor as symptoms stabl;e had stenosis in distal esophagus dilated  54 French 08/2008 without help ? motility issue, again, she declined further evaluation with Ba swallow or manometry  Patient Instructions: 1)  We will see you at your procedure on  09/14/09 2)  Please pick up your medications at your pharmacy--Moviprep 3)  [facility] Patient Information Guide given to patient.  4)  Colonoscopy and Flexible Sigmoidoscopy brochure given.  5)  Copy sent to : Thomos Lemons, DO 6)  The medication list was reviewed and reconciled.  All changed / newly prescribed medications were explained.  A complete medication list was provided to the patient / caregiver. Prescriptions: MOVIPREP 100 GM  SOLR (PEG-KCL-NACL-NASULF-NA ASC-C) As per prep instructions.  #1 x 0   Entered by:   Francee Piccolo CMA (AAMA)   Authorized by:   Iva Boop MD, [facility]   Signed by:   Francee Piccolo CMA (AAMA) on 08/19/2009   Method used:   Electronically to        [facility]* (retail)       [address]       [place], [place]  [address]       Ph: [phone]       Fax: [phone]   RxID:   [id number]

## 2010-08-08 NOTE — Assessment & Plan Note (Signed)
Summary: f/u to discuss stress & med request / tf,cma   Vital Signs:  Patient profile:   65 year old female Height:      65.5 inches Weight:      123 pounds BMI:     20.23 O2 Sat:      98 % on Room air Pulse rate:   85 / minute BP sitting:   122 / 70  (left arm) Cuff size:   regular  Vitals Entered By: Payton Spark CMA (May 26, 2010 8:16 AM)  O2 Flow:  Room air CC: F/U mood and sleep. Lost job and c/o increased stress.    Primary Care Provider:  DThomos Lemons DO  CC:  F/U mood and sleep. Lost job and c/o increased stress. Marland Kitchen  History of Present Illness: 65 y/o white female for f/u pt reports acute stress rxn.   employer eliminated her position working until December she is having trouble sleeping worrying about her finances    Current Medications (verified): 1)  Levothyroxine Sodium 125 Mcg Tabs (Levothyroxine Sodium) .... Take 1 Tablet By Mouth Once A Day 2)  Hydrocodone-Acetaminophen 10-325 Mg  Tabs (Hydrocodone-Acetaminophen) .... One Tablet By Mouth Four Times A Day 3)  Simvastatin 20 Mg Tabs (Simvastatin) .... Take 1 Tablet By Mouth Once A Day 4)  Bisacodyl Ec 5 Mg  Tbec (Bisacodyl) .... Take 1 Tablet By Mouth Once A Day 5)  Carisoprodol 350 Mg  Tabs (Carisoprodol) .... One Tablet By Mouth Every 6 Hours As Needed 6)  Voltaren 1 %  Gel (Diclofenac Sodium) .... Apply To Affected Area Four Times A Day As Needed 7)  Omeprazole 20 Mg  Cpdr (Omeprazole) .... 2 By Mouth Once Daily 8)  Temazepam 30 Mg Caps (Temazepam) .... Take 1 Capsule By Mouth At Bedtime 9)  Hydrochlorothiazide 25 Mg Tabs (Hydrochlorothiazide) .... Take 1  Tablet By Mouth Once A Day 10)  Klor-Con M20 20 Meq Cr-Tabs (Potassium Chloride Crys Cr) .... Take 1 Tablet By Mouth Once Daily  Allergies (verified): 1)  ! Neurontin 2)  Maxzide-25 (Triamterene-Hctz)  Past History:  Past Medical History: Chronic Low Back pain - followed by pain mgt - Dr. Venia Carbon Hypertension  Hyperlipidemia  Left knee  advanced osteoarthritis  Tobacco Abuse  Hypothyroidism  History of Depression   History of Benign arrythmia (Cardiologist - Traci Turner) Esophageal stricture  Sleep Apnea      Past Surgical History: 4 lumbar surgeries.   Lumbar fusion and rods   Caesarean section Left ankle surgery 1995 History if implantable morphine pump  Partial Hysterectomy        Social History: Divorced Current Smoker  1 ppd -  20 yrs    Alcohol use-no     [employer] - laid off  Physical Exam  General:  alert, well-developed, and well-nourished.   Lungs:  normal respiratory effort and normal breath sounds.   Heart:  normal rate, regular rhythm, and no gallop.   Psych:  normally interactive, good eye contact, and slightly anxious.     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  Complete Medication List: 1)  Levothyroxine Sodium 125 Mcg Tabs (Levothyroxine sodium) .... Take 1 tablet by mouth once a day 2)  Hydrocodone-acetaminophen 10-325 Mg Tabs (Hydrocodone-acetaminophen) .... One tablet by mouth four times a day 3)  Simvastatin 20 Mg Tabs (Simvastatin) .... Take 1 tablet by mouth once a day 4)  Bisacodyl Ec 5 Mg Tbec (Bisacodyl) .... Take 1 tablet by mouth once a day 5)  Carisoprodol 350 Mg Tabs (Carisoprodol) .... One tablet by mouth every 6 hours as needed 6)  Voltaren 1 % Gel (Diclofenac sodium) .... Apply to affected area four times a day as needed 7)  Omeprazole 20 Mg Cpdr (Omeprazole) .... 2 by mouth once daily 8)  Temazepam 30 Mg Caps (Temazepam) .... Take 1 capsule by mouth at bedtime as needed 9)  Hydrochlorothiazide 25 Mg Tabs (Hydrochlorothiazide) .... Take 1  tablet by mouth once a day 10)  Klor-con M20 20 Meq Cr-tabs (Potassium chloride crys cr) .... Take 1  tablet by mouth once daily 11)  Sertraline Hcl 25 Mg Tabs (Sertraline hcl) .... 1/2 by mouth once daily x 7 days, then 1 tab by mouth once daily  Patient Instructions: 1)  Please schedule a follow-up appointment in 2 months. Prescriptions: TEMAZEPAM 30 MG CAPS (TEMAZEPAM) Take 1 capsule by mouth at bedtime as needed  #90 x 0   Entered and Authorized by:   D. Thomos Lemons DO   Signed by:   D. Thomos Lemons DO on 05/26/2010   Method used:   Print then Give to Patient   RxID:   [id number] SERTRALINE HCL 25 MG TABS (SERTRALINE HCL) 1/2 by mouth once daily x 7 days, then 1 tab by mouth once daily  #30 x 1   Entered and Authorized by:   D. Thomos Lemons DO   Signed by:   D. Thomos Lemons DO on 05/26/2010   Method used:   Electronically to        [facility]* (retail)       [address]       [place], [place]  [address]       Ph: [phone]       Fax: [phone]   RxID:   [id number]    Orders Added: 1)  Est. Patient Level III [34742]

## 2010-08-08 NOTE — Progress Notes (Signed)
Summary: Chantix Refill  Phone Note Call from Patient Call back at Home Phone [phone]   Caller: Patient Reason for Call: Refill Medication Complaint: Urinary/GYN Problems Summary of Call: patient called and left voice message requesting rx refill on Chantix so that she could continue taking to help her stop smoking she is requesting the rx to [facility] for the "blue pills" Initial call taken by: Glendell Docker CMA,  Dec 06, 2009 3:15 PM  Follow-up for Phone Call        Notified pt that rx was sent to [facility].  Order for 12/08/09 labs sent to the lab. Pt. notified of lab hours.  Nicki Guadalajara Fergerson CMA  December 07, 2009 3:47 PM     New/Updated Medications: CHANTIX 0.5 MG TABS (VARENICLINE TARTRATE) one by mouth two times a day Prescriptions: CHANTIX 0.5 MG TABS (VARENICLINE TARTRATE) one by mouth two times a day  #180 x 1   Entered and Authorized by:   D. Thomos Lemons DO   Signed by:   D. Thomos Lemons DO on 12/07/2009   Method used:   Electronically to        [facility]* (mail-order)             ,          Ph: [phone]       Fax: [phone]   RxID:   [id number]

## 2010-08-08 NOTE — Progress Notes (Signed)
  Phone Note Outgoing Call   Summary of Call: call pt - thyroid u/s shows stable left thyroid nodule.   I suggest earlier f/u appt within 2 weeks to discuss wt loss Initial call taken by: D. Thomos Lemons DO,  July 01, 2009 8:49 PM  Follow-up for Phone Call        Pt notified as directed   appt   Jan  19th  Follow-up by: Darral Dash,  July 04, 2009 10:47 AM

## 2010-08-08 NOTE — Progress Notes (Signed)
  Phone Note Refill Request Message from:  Patient on July 06, 2009 2:27 PM  Refills Requested: Medication #1:  HYDROCHLOROTHIAZIDE 25 MG TABS Take 1 tablet by mouth once a day (patient to verify dosage) please resend this [facility] instead.    Method Requested: Electronic Initial call taken by: Michaelle Copas,  July 06, 2009 2:27 PM Caller: Patient Summary of Call: Please resend     Prescriptions: HYDROCHLOROTHIAZIDE 25 MG TABS (HYDROCHLOROTHIAZIDE) Take 1 tablet by mouth once a day (patient to verify dosage)  #30 x 5   Entered and Authorized by:   D. Thomos Lemons DO   Signed by:   D. Thomos Lemons DO on 07/07/2009   Method used:   Electronically to        [facility]* (retail)       [address]       [place], [place]  [id number]       Ph: [phone]       Fax: [phone]   RxID:   [id number]

## 2010-08-08 NOTE — Letter (Signed)
Summary: [facility] Instructions  [facility]  [address] [place], [place] [address]   Phone: [phone]  Fax: [phone]       Joan [name]    [birth year]    MRN: [id number]        Procedure Day Dorna BloomLulu Riding, 09/14/09     Arrival Time: 9:30 AM      Procedure Time: 10:30 AM     Location of Procedure:                    _X_  [facility] (4th Floor)                       PREPARATION FOR COLONOSCOPY WITH MOVIPREP   Starting 5 days prior to your procedure 09/09/09 do not eat nuts, seeds, popcorn, corn, beans, peas,  salads, or any raw vegetables.  Do not take any fiber supplements (e.g. Metamucil, Citrucel, and Benefiber).  THE DAY BEFORE YOUR PROCEDURE         DATE: 09/13/09     DAY: TUESDAY  1.  Drink clear liquids the entire day-NO SOLID FOOD  2.  Do not drink anything colored red or purple.  Avoid juices with pulp.  No orange juice.  3.  Drink at least 64 oz. (8 glasses) of fluid/clear liquids during the day to prevent dehydration and help the prep work efficiently.  CLEAR LIQUIDS INCLUDE: Water Jello Ice Popsicles Tea (sugar ok, no milk/cream) Powdered fruit flavored drinks Coffee (sugar ok, no milk/cream) Gatorade Juice: apple, white grape, white cranberry  Lemonade Clear bullion, consomm, broth Carbonated beverages (any kind) Strained chicken noodle soup Hard Candy                             4.  In the morning, mix first dose of MoviPrep solution:    Empty 1 Pouch A and 1 Pouch B into the disposable container    Add lukewarm drinking water to the top line of the container. Mix to dissolve    Refrigerate (mixed solution should be used within 24 hrs)  5.  Begin drinking the prep at 5:00 p.m. The MoviPrep container is divided by 4 marks.   Every 15 minutes drink the solution down to the next mark (approximately 8 oz) until the full liter is complete.   6.  Follow completed prep with 16 oz of clear liquid of your choice  (Nothing red or purple).  Continue to drink clear liquids until bedtime.  7.  Before going to bed, mix second dose of MoviPrep solution:    Empty 1 Pouch A and 1 Pouch B into the disposable container    Add lukewarm drinking water to the top line of the container. Mix to dissolve    Refrigerate  THE DAY OF YOUR PROCEDURE      DATE: 09/14/09      DAY: WEDNESDAY  Beginning at 5:30a.m. (5 hours before procedure):         1. Every 15 minutes, drink the solution down to the next mark (approx 8 oz) until the full liter is complete.  2. Follow completed prep with 16 oz. of clear liquid of your choice.    3. You may drink clear liquids until 8:30AM  (2 HOURS BEFORE PROCEDURE).   MEDICATION INSTRUCTIONS  Unless otherwise instructed, you should take regular prescription medications with a small sip of water   as  early as possible the morning of your procedure.          OTHER INSTRUCTIONS  You will need a responsible adult at least 66 years of age to accompany you and drive you home.   This person must remain in the waiting room during your procedure.  Wear loose fitting clothing that is easily removed.  Leave jewelry and other valuables at home.  However, you may wish to bring a book to read or  an iPod/MP3 player to listen to music as you wait for your procedure to start.  Remove all body piercing jewelry and leave at home.  Total time from sign-in until discharge is approximately 2-3 hours.  You should go home directly after your procedure and rest.  You can resume normal activities the  day after your procedure.  The day of your procedure you should not:   Drive   Make legal decisions   Operate machinery   Drink alcohol   Return to work  You will receive specific instructions about eating, activities and medications before you leave.    The above instructions have been reviewed and explained to me by   _______________________    I fully understand and can  verbalize these instructions _____________________________ Date _________

## 2010-08-08 NOTE — Progress Notes (Signed)
Summary: Levothyroxine Refill  Phone Note Refill Request Message from:  Fax from Pharmacy on October 03, 2009 4:25 PM  Refills Requested: Medication #1:  LEVOTHYROXINE SODIUM 125 MCG TABS Take 1 tablet by mouth once a day   Supply Requested: 3 months  Method Requested: Electronic Next Appointment Scheduled: 12/17/2009 Initial call taken by: Glendell Docker CMA,  October 03, 2009 4:25 PM    Prescriptions: LEVOTHYROXINE SODIUM 125 MCG TABS (LEVOTHYROXINE SODIUM) Take 1 tablet by mouth once a day  #90 x 3   Entered by:   Glendell Docker CMA   Authorized by:   D. Thomos Lemons DO   Signed by:   Glendell Docker CMA on 10/03/2009   Method used:   Electronically to        [facility]* (mail-order)             ,          Ph: [phone]       Fax: [phone]   RxID:   [id number]

## 2010-08-08 NOTE — Progress Notes (Signed)
Summary: Omeprazole Refill  Phone Note Refill Request Message from:  Fax from Pharmacy on August 12, 2009 11:28 AM  Refills Requested: Medication #1:  OMEPRAZOLE 20 MG  CPDR 2 by mouth two times a day   Dosage confirmed as above?Dosage Confirmed   Brand Name Necessary? No   Supply Requested: 3 months  Method Requested: Electronic Initial call taken by: Glendell Docker CMA,  August 12, 2009 11:28 AM  Follow-up for Phone Call        Rx completed in Dr. Tiajuana Amass Follow-up by: Glendell Docker CMA,  August 12, 2009 11:28 AM    Prescriptions: OMEPRAZOLE 20 MG  CPDR (OMEPRAZOLE) 2 by mouth two times a day  #180 x 3   Entered by:   Glendell Docker CMA   Authorized by:   D. Thomos Lemons DO   Signed by:   Glendell Docker CMA on 08/12/2009   Method used:   Electronically to        [facility]* (mail-order)             ,          Ph: [phone]       Fax: [phone]   RxID:   [id number]

## 2010-08-08 NOTE — Progress Notes (Signed)
Summary: Bone Density Order  Phone Note From Other Clinic Call back at [phone]   Caller: [facility] Summary of Call: Tammy from [facility]  Is requesting and order for a Bone Density for this patient. If approved she would like order to be faxed to [phone] Initial call taken by: Glendell Docker CMA,  November 05, 2008 4:39 PM  Follow-up for Phone Call        Done Follow-up by: Glendell Docker CMA,  Nov 08, 2008 1:23 PM  New Problems: HEALTH SCREENING (ICD-V70.0)   New Problems: HEALTH SCREENING (ICD-V70.0)

## 2010-08-08 NOTE — Assessment & Plan Note (Signed)
Summary: was seen in E.R for dehydration- jr   Vital Signs:  Patient profile:   65 year old female Weight:      127.75 pounds BMI:     21.01 O2 Sat:      99 % on Room air Temp:     98.1 degrees F oral Pulse rate:   77 / minute Pulse rhythm:   regular Resp:     18 per minute BP sitting:   120 / 80  (right arm) Cuff size:   regular  Vitals Entered By: Glendell Docker CMA (June 08, 2009 8:35 AM)  O2 Flow:  Room air  Primary Care Provider:  D. Thomos Lemons DO  CC:   Follow up.  History of Present Illness:  Hypertension Follow-Up      This is a 65 year old woman who presents for Hypertension follow-up.  The patient denies lightheadedness.  The patient denies the following associated symptoms: chest pain.  Pt tried maxzide but could not tolerate due to rash and itching.   she went back to taking hctz.  she was sent to ER by her endocrinologist due to concerns of severe hypokalema (0.8).  repeat testing was normal at 4.3.  she had headache at work.    Preventive Screening-Counseling & Management  Alcohol-Tobacco     Smoking Status: current  Allergies: 1)  ! Neurontin 2)  Maxzide-25 Associate Professor)  Past History:  Past Medical History: Chronic Low Back pain - followed by pain mgt - Dr. Venia Carbon Hypertension Hyperlipidemia Left knee advanced osteoarthritis  Tobacco Abuse  Hypothyroidism History of Depression  History of Benign arrythmia (Cardiologist - Traci Turner) Esophageal stricture  Sleep Apnea    Family History: Father deceased age 36 - pancreatic cancer Mother deceased age 32 - DM II, Htn, CAD Sister with diabetes     Social History: Divorced Current Smoker  1 ppd -  20 yrs  Alcohol use-no      Review of Systems       + wt loss,  denies dysphagia.  no hemoptysis  Physical Exam  General:  alert, well-developed, and well-nourished.   Eyes:  pupils equal, pupils round, and pupils reactive to light.   Mouth:  Oral mucosa and oropharynx without  lesions or exudates.  Neck:  no masses and no carotid bruits.   Lungs:  normal respiratory effort, normal breath sounds, and no wheezes.   Heart:  normal rate, regular rhythm, and no gallop.   Extremities:  No lower extremity edema  Neurologic:  cranial nerves II-XII intact and gait normal.   Psych:  normally interactive and good eye contact.     Impression & Recommendations:  Problem # 1:  HYPOTHYROIDISM (ICD-244.9) Pt with wt loss.  repeat TFTs  Her updated medication list for this problem includes:    Levothyroxine Sodium 125 Mcg Tabs (Levothyroxine sodium) ..... One by mouth qd  Orders: T-TSH ([id number])  Problem # 2:  HYPERTENSION (ICD-401.9) stable.  she could not tolerate maxzide due to rash and pruritus.  Her updated medication list for this problem includes:    Hydrochlorothiazide 25 Mg Tabs (Hydrochlorothiazide) .Marland Kitchen... Take 1 tablet by mouth once a day (patient to verify dosage)  Orders: T-Basic Metabolic Panel ([phone])  BP today: 120/80 Prior BP: 135/08 (04/20/2009)  Labs Reviewed: K+: 4.0 (02/10/2009) Creat: : 0.74 (02/10/2009)   Chol: 146 (02/07/2009)   HDL: 51.60 (02/07/2009)   LDL: 70 (02/07/2009)   TG: 121.0 (02/07/2009)  Problem # 3:  TOBACCO ABUSE (  ICD-305.1) Pt motivated to quit.  restart chantix.  1mg  dose caused problems with dreams.  Her updated medication list for this problem includes:    Chantix 0.5 Mg Tabs (Varenicline tartrate) ..... One by mouth once daily x 3 days, then one by mouth bid  Problem # 4:  WEIGHT LOSS, ABNORMAL (ICD-783.21) pt with long hx of tob abuse notes wt loss.  check CT of chest.  Orders: Radiology Referral (Radiology)  Complete Medication List: 1)  Estrace 1 Mg Tabs (Estradiol) .... Take 1/2  tablet by mouth once a day 2)  Levothyroxine Sodium 125 Mcg Tabs (Levothyroxine sodium) .... One by mouth qd 3)  Hydrocodone-acetaminophen 10-325 Mg Tabs (Hydrocodone-acetaminophen) .... One tablet by mouth five times a  day 4)  Simvastatin 20 Mg Tabs (Simvastatin) .... Take 1 tablet by mouth once a day 5)  Bisacodyl Ec 5 Mg Tbec (Bisacodyl) .... Take 1 tablet by mouth once a day 6)  Carisoprodol 350 Mg Tabs (Carisoprodol) .... One tablet by mouth every 8 hours as needed 7)  Voltaren 1 % Gel (Diclofenac sodium) .... Apply to affected area four times a day as needed 8)  Omeprazole 20 Mg Cpdr (Omeprazole) .... 2 by mouth two times a day 9)  Temazepam 30 Mg Caps (Temazepam) .... One by mouth qhs 10)  Hydrochlorothiazide 25 Mg Tabs (Hydrochlorothiazide) .... Take 1 tablet by mouth once a day (patient to verify dosage) 11)  Klor-con M20 20 Meq Cr-tabs (Potassium chloride crys cr) .... Take 1 tablet by mouth two times a day 12)  Chantix 0.5 Mg Tabs (Varenicline tartrate) .... One by mouth once daily x 3 days, then one by mouth bid  Patient Instructions: 1)  Please schedule a follow-up appointment in 3 months. Prescriptions: CHANTIX 0.5 MG TABS (VARENICLINE TARTRATE) one by mouth once daily x 3 days, then one by mouth bid  #60 x 2   Entered and Authorized by:   D. Thomos Lemons DO   Signed by:   D. Thomos Lemons DO on 06/08/2009   Method used:   Print then Give to Patient   RxID:   [id number] KLOR-CON M20 20 MEQ CR-TABS (POTASSIUM CHLORIDE CRYS CR) Take 1 tablet by mouth two times a day  #30 x 5   Entered and Authorized by:   D. Thomos Lemons DO   Signed by:   D. Thomos Lemons DO on 06/08/2009   Method used:   Electronically to        [facility]* (retail)       [address]       [place], [place]  [id number]       Ph: [phone]       Fax: [phone]   RxID:   [id number] HYDROCHLOROTHIAZIDE 25 MG TABS (HYDROCHLOROTHIAZIDE) Take 1 tablet by mouth once a day (patient to verify dosage)  #30 x 5   Entered and Authorized by:   D. Thomos Lemons DO   Signed by:   D. Thomos Lemons DO on 06/08/2009   Method used:   Electronically to        [facility]* (retail)       [address]        [place], [place]  [id number]       Ph: [phone]       Fax: [phone]   RxID:   [id number] CHANTIX 0.5 MG TABS (VARENICLINE TARTRATE) one by mouth once daily x 3 days, then one by mouth bid  #60  x 2   Entered and Authorized by:   D. Thomos Lemons DO   Signed by:   D. Thomos Lemons DO on 06/08/2009   Method used:   Electronically to        [facility]* (retail)       [address]       [place], [place]  [address]       Ph: [phone]       Fax: [phone]   RxID:   [id number]    Immunization History:  Influenza Immunization History:    Influenza:  historical (04/19/2009)    Preventive Care Screening  Pap Smear:    Date:  09/21/2008    Results:  normal    Current Allergies (reviewed today): ! NEURONTIN MAXZIDE-25 (TRIAMTERENE-HCTZ)

## 2010-08-08 NOTE — Letter (Signed)
Summary: Patient Notice- Polyp Results  [facility]  [address] [place], [place] [address]   Phone: [phone]  Fax: [phone]        September 20, 2009 MRN: [id number]    Joan [name] [address] [place], [place]  [address]    Dear [name],  The polyps removed from your colon were adenomatous. This means that they were pre-cancerous or that  they had the potential to change into cancer over time.  I recommend that you have a repeat colonoscopy in 5 years to determine if you have developed any new polyps over time. If you develop any new rectal bleeding, abdominal pain or significant bowel habit changes, please contact us before then.  In addition to repeating colonoscopy, changing health habits may reduce your risk of having more colon polyps and possibly, colon cancer. You may lower your risk of future polyps and colon cancer by adopting healthy habits such as not smoking or using tobacco (if you do), being physically active, losing weight (if overweight), and eating a diet which includes fruits and vegetables and limits red meat.  Please call us if you are having persistent problems or have questions about your condition that have not been fully answered at this time.   Sincerely,  Iva Boop MD, [facility]  This letter has been electronically signed by your physician.  Appended Document: Patient Notice- Polyp Results Letter mailed 3.16.11

## 2010-08-08 NOTE — Miscellaneous (Signed)
Summary: Orders Update pft charges  Clinical Lists Changes  Orders: Added new Service order of Carbon Monoxide diffusing w/capacity (94720) - Signed Added new Service order of Lung Volumes (94240) - Signed Added new Service order of Spirometry (Pre & Post) (94060) - Signed 

## 2010-08-08 NOTE — Progress Notes (Signed)
Summary: sch proc  Phone Note Call from Patient Call back at Home Phone [phone]   Caller: Patient Call For: Joan [name]  Reason for Call: Talk to Nurse Details for Reason: sch proc Summary of Call: pt needs to set up proc most likely at hosp Initial call taken by: Guadlupe Spanish [facility],  July 29, 2008 2:35 PM  Follow-up for Phone Call        Left message for patient to call back Darcey Nora RN  July 29, 2008 3:16 PM  patient called to schedule EGD.  I offered her appts here, but she has no care partner.  I offered her an appt at the hospital, for 08-30-08, but she declined to schedule that far out.  She will try to find a care partner to stay here with her and call back to schedule Follow-up by: Darcey Nora RN,  July 30, 2008 9:43 AM

## 2010-08-08 NOTE — Medication Information (Signed)
Summary: Letter Regarding Temazepam Use in [employer]  Letter Regarding Temazepam Use in [employer]   Imported By: Lanelle Bal 12/12/2009 12:05:24  _____________________________________________________________________  External Attachment:    Type:   Image     Comment:   External Document

## 2010-08-08 NOTE — Procedures (Signed)
Summary: Colonoscopy  Patient: Joan [name] Note: All result statuses are Final unless otherwise noted.  Tests: (1) Colonoscopy (COL)   COL Colonoscopy           DONE     [facility]     [address]     [place], [place]  [address]           COLONOSCOPY PROCEDURE REPORT           PATIENT:  Joan, [name]  MR#:  [id number]     BIRTHDATE:  April 05, 1946, 63 yrs. old  GENDER:  female           ENDOSCOPIST:  Iva Boop, MD, [facility]           PROCEDURE DATE:  09/14/2009     PROCEDURE:  Colonoscopy with snare polypectomy     ASA CLASS:  Class II     INDICATIONS:  history of pre-cancerous (adenomatous) colon polyps,     surveillance and screening           MEDICATIONS:   Fentanyl 100 mcg IV, Versed 8 mg IV           DESCRIPTION OF PROCEDURE:   After the risks benefits and     alternatives of the procedure were thoroughly explained, informed     consent was obtained.  Digital rectal exam was performed and     revealed no abnormalities.   The LB [id number] endoscope     was introduced through the anus and advanced to the cecum, which     was identified by both the appendix and ileocecal valve, without     limitations.  The quality of the prep was excellent, using     MoviPrep.  The instrument was then slowly withdrawn as the colon     was fully examined. Insertion: 3:48 minutes Withdrawal:11:34     minutes     <<PROCEDUREIMAGES>>           FINDINGS:  Three polyps were found. 7 mm descending, 4 mm     transverse and 5 mm sigmoid polyp. Polyps were snared without     cautery. Retrieval was successful for the transverse and     descending polyps The sigmoid polyp was snared without cautery.     Retrieval was unsuccessful. Moderate diverticulosis was found in     the sigmoid colon.  This was otherwise a normal examination of the     colon. Right colon retroflex performed.   Retroflexed views in the     rectum revealed internal and external hemorrhoids.    The  scope     was then withdrawn from the patient and the procedure completed.           COMPLICATIONS:  None           ENDOSCOPIC IMPRESSION:     1) Three polyps removed, maximum size 7 mm     2) Moderate diverticulosis in the sigmoid colon     3) Internal and external hemorrhoids     4) Otherwise normal examination, excellent prep           5) Prior colonic adenoma removal 2006           REPEAT EXAM:  In for Colonoscopy, pending biopsy results.           Iva Boop, MD, [facility]           CC:  Thomos Lemons, DO  The Patient           n.     eSIGNED:   Iva Boop at 09/14/2009 11:39 AM           Sung Amabile, [phone]  Note: An exclamation mark (!) indicates a result that was not dispersed into the flowsheet. Document Creation Date: 09/14/2009 11:39 AM _______________________________________________________________________  (1) Order result status: Final Collection or observation date-time: 09/14/2009 11:30 Requested date-time:  Receipt date-time:  Reported date-time:  Referring Physician:   Ordering Physician: Stan Head [id number]) Specimen Source:  Source: Launa Grill Order Number: [id number] Lab site:   Appended Document: Colonoscopy     Procedures Next Due Date:    Colonoscopy: 09/2014

## 2010-08-08 NOTE — Progress Notes (Signed)
Summary: Simvastatin Refill  Phone Note Refill Request Message from:  Fax from Pharmacy on September 07, 2009 4:24 PM  Refills Requested: Medication #1:  SIMVASTATIN 20 MG TABS Take 1 tablet by mouth once a day   Dosage confirmed as above?Dosage Confirmed   Brand Name Necessary? No   Supply Requested: 3 months  Method Requested: Electronic Next Appointment Scheduled: 12/15/2009 Initial call taken by: Glendell Docker CMA,  September 07, 2009 4:24 PM  Follow-up for Phone Call        Rx completed in Dr. Tiajuana Amass Follow-up by: Glendell Docker CMA,  September 07, 2009 4:25 PM    Prescriptions: SIMVASTATIN 20 MG TABS (SIMVASTATIN) Take 1 tablet by mouth once a day  #90 x 3   Entered by:   Glendell Docker CMA   Authorized by:   D. Thomos Lemons DO   Signed by:   Glendell Docker CMA on 09/07/2009   Method used:   Electronically to        [facility]* (mail-order)             ,          Ph: [phone]       Fax: [phone]   RxID:   [id number]

## 2010-08-08 NOTE — Assessment & Plan Note (Signed)
Summary: rash on shoulders & back, wrist  & elbow   Vital Signs:  Patient Profile:   65 Years Old Female Height:     65.5 inches Weight:      157.50 pounds BMI:     25.90 Temp:     98.2 degrees F oral Pulse rate:   76 / minute Pulse rhythm:   regular BP sitting:   124 / 74  (right arm) Cuff size:   regular  Vitals Entered By: Glendell Docker CMA (September 13, 2008 2:11 PM)                 PCP:  Dondra Spry DO  Chief Complaint:  Rash.  History of Present Illness:  Rash      This is a 65 year old woman who presents with Rash.  Patient reports small red spots located on her back and bilateral arms.    The rash is worse with scratching and better with OTC creams.  She denies any changes in previous hygiene behavior.  Chronic insomnia-patient reports mild improvement in sleep quality with 15 mg of temazepam. She does not stay asleep through the night.  No adverse effects from temazepam noted.    Current Allergies (reviewed today): ! NEURONTIN  Past Medical History:    Chronic Low Back pain - followed by pain mgt - Dr. Venia Carbon    Hypertension    Hyperlipidemia    Left knee advanced osteoarthritis    Tobacco Abuse    Hypothyroidism    History of Depression    History of Benign arrythmia (Cardiologist - Traci Turner)    Esophageal stricture     Sleep Apnea    Past Surgical History:    4 lumbar surgeries.      Lumbar fusion and rods    Caesarean section    Left ankle surgery 1995    History if implantable morphine pump     Hysterectomy         Social History:    Divorced    Current Smoker  1 ppd -  20 yrs    Alcohol use-no          Review of Systems       Patient also complains of chronic knee pain.  She has history of chronic osteoarthritis.   Physical Exam  General:     alert, well-developed, and well-nourished.   Mouth:     No deformity or lesions, dentition normal. Neck:     thyromegaly l>r Lungs:     normal respiratory effort and normal  breath sounds.   Heart:     Regular rate and rhythm; no murmurs, rubs,  or bruits. Extremities:     No clubbing, cyanosis, edema or deformities noted. Neurologic:     cranial nerves II-XII intact and gait normal.   Skin:     Small slightly raised maculopapular lesions over back and  arms. Psych:     normally interactive and flat affect.      Impression & Recommendations:  Problem # 1:  CARBUNCLE AND FURUNCLE OF OTHER SPECIFIED SITES (ICD-39.75) 65 year old white female with small maculopapular lesions scattered on the upper arm and back. I suspect mild case of carbuncles. Patient advised to use over-the-counter triple antibiotic ointment. Patient advised to call office if symptoms worsen.  Problem # 2:  INSOMNIA (ICD-780.52) Patient notes mild improvement with 15 mg of temazepam.  Increased to 30 mg p.r.n. Her updated medication list for this problem includes:  Temazepam 30 Mg Caps (Temazepam) ..... One by mouth qhs   Problem # 3:  KNEE PAIN, LEFT (ICD-90.58) 65 year old white female with chronic left knee pain. Refer to [facility] for further evaluation and treatment. Her updated medication list for this problem includes:    Hydrocodone-acetaminophen 10-325 Mg Tabs (Hydrocodone-acetaminophen) ..... One tablet by mouth every 6 hours as needed    Carisoprodol 350 Mg Tabs (Carisoprodol) ..... One tablet by mouth every 8 hours as needed  Orders: Orthopedic Referral (Ortho)   Problem # 4:  GOITER, UNSPECIFIED (ICD-240.9) Arrange thyroid U/S Orders: Radiology Referral (Radiology)   Complete Medication List: 1)  Hydrochlorothiazide 25 Mg Tabs (Hydrochlorothiazide) .... Take 1 tablet by mouth once a day 2)  Estrace 1 Mg Tabs (Estradiol) .... Take 1 tablet by mouth once a day 3)  Levothyroxine Sodium 100 Mcg Tabs (Levothyroxine sodium) .... Take 1 tablet by mouth once a day 4)  Hydrocodone-acetaminophen 10-325 Mg Tabs (Hydrocodone-acetaminophen) .... One tablet by mouth every 6  hours as needed 5)  Simvastatin 20 Mg Tabs (Simvastatin) .... Take 1 tablet by mouth once a day 6)  Bisacodyl Ec 5 Mg Tbec (Bisacodyl) .... Take 1 tablet by mouth once a day 7)  Carisoprodol 350 Mg Tabs (Carisoprodol) .... One tablet by mouth every 8 hours as needed 8)  Voltaren 1 % Gel (Diclofenac sodium) .... Apply to affected area four times a day as needed 9)  Omeprazole 20 Mg Cpdr (Omeprazole) .... 2 by mouth once daily ac 10)  Temazepam 30 Mg Caps (Temazepam) .... One by mouth qhs   Patient Instructions: 1)  Please schedule a follow-up appointment in 1 month.   Prescriptions: TEMAZEPAM 30 MG CAPS (TEMAZEPAM) one by mouth qhs  #30 x 2   Entered and Authorized by:   D. Thomos Lemons DO   Signed by:   D. Thomos Lemons DO on 09/13/2008   Method used:   Print then Give to Patient   RxID:   [id number]   Current Allergies (reviewed today): ! NEURONTIN

## 2010-08-08 NOTE — Assessment & Plan Note (Signed)
Summary: 6 month rov-ch rsc with pt/mhf   Vital Signs:  Patient profile:   65 year old female Height:      65.5 inches Weight:      126.25 pounds BMI:     20.76 O2 Sat:      100 % on Room air Temp:     97.9 degrees F oral Pulse rate:   66 / minute Pulse rhythm:   regular Resp:     16 per minute BP sitting:   120 / 70  (right arm) Cuff size:   regular  Vitals Entered By: Glendell Docker CMA (December 15, 2009 8:13 AM)  O2 Flow:  Room air CC: Rm 3- 6 Month Follow up  Is Patient Diabetic? No   Primary Care Provider:  DThomos Lemons DO  CC:  Rm 3- 6 Month Follow up .  History of Present Illness: 65 y/o white female for f/u.   she is concerned that blood pressure has been running low,  headaches 2-3 times out of the week, reviewed  labs  breast cancer -sister Waylan Boga) diagnosed  Preventive Screening-Counseling & Management  Alcohol-Tobacco     Smoking Status: current  Allergies: 1)  ! Neurontin 2)  Maxzide-25 (Triamterene-Hctz)  Past History:  Past Medical History: Chronic Low Back pain - followed by pain mgt - Dr. Venia Carbon Hypertension Hyperlipidemia  Left knee advanced osteoarthritis  Tobacco Abuse  Hypothyroidism History of Depression   History of Benign arrythmia (Cardiologist - Traci Turner) Esophageal stricture  Sleep Apnea      Past Surgical History: 4 lumbar surgeries.   Lumbar fusion and rods   Caesarean section Left ankle surgery 1995 History if implantable morphine pump  Partial Hysterectomy      Family History: Father deceased age 11 - pancreatic cancer Mother deceased age 86 - DM II, Htn, CAD Sister with diabetes     No FH of Colon Cancer: sister - Waylan Boga (diagnosed with Stage IV invasive ductal carcinoma)  Social History: Divorced Current Smoker  1 ppd -  20 yrs    Alcohol use-no      Physical Exam  General:  alert and underweight appearing.   Lungs:  normal respiratory effort and normal breath sounds.   Heart:   normal rate, regular rhythm, and no gallop.   Abdomen:  soft, non-tender, and normal bowel sounds.   Extremities:  No lower extremity edema   Impression & Recommendations:  Problem # 1:  HYPERTENSION (ICD-401.9) decrease hctz dose due to lower BP.   Her updated medication list for this problem includes:    Hydrochlorothiazide 25 Mg Tabs (Hydrochlorothiazide) .Marland Kitchen... Take 1/2  tablet by mouth once a day  Future Orders: T-Basic Metabolic Panel [id number]) ... 06/05/2010  BP today: 120/70 Prior BP: 124/68 (08/19/2009)  Labs Reviewed: K+: 3.6 (12/08/2009) Creat: : 0.72 (12/08/2009)   Chol: 146 (02/07/2009)   HDL: 51.60 (02/07/2009)   LDL: 70 (02/07/2009)   TG: 121.0 (02/07/2009)  Problem # 2:  HYPOTHYROIDISM (ICD-244.9) stable.  continue same dose Her updated medication list for this problem includes:    Levothyroxine Sodium 125 Mcg Tabs (Levothyroxine sodium) .Marland Kitchen... Take 1 tablet by mouth once a day  Future Orders: T-TSH ([id number]) ... 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  The right lobe measures 4.1 cm sagittally with a depth of 1.4 cm and width of 1.3 cm.  (Prior measurements were 4.2 x 1.5 x 1.1 cm).  The left lobe currently measures 4.1 x 1.6 x 1.0 cm, with the isthmus measuring 3 mm.  (Prior measurements were 4.0 x 1.4 x 1.1 cm with the isthmus stable at 3 mm).  The solid nodule with internal calcifications in the lower left lobe is stable measuring 1.4 x 1.3 x 0.7 cm. Previously a hypoechoic nodule was noted the upper lobe on the left which is not currently seen.  continue surveillance thyroid u/s Future Orders: Radiology Referral (Radiology) ... 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  repeat CT  Dec. 1.  No acute process or explanation for weight loss within the chest. 2.  Tiny left upper lobe lung nodules. Given risk factors for bronchogenic carcinoma, follow-up chest CT at 1 year is recommended.  Future Orders: Radiology Referral (Radiology) ... 06/16/2010  Problem # 5:  FAMILY HISTORY BREAST CANCER 1ST DEGREE RELATIVE <50 (ICD-V16.3) continue yearly mammograms.  sister to consider genetic testing  Complete Medication List: 1)  Levothyroxine Sodium 125 Mcg Tabs (Levothyroxine sodium) .... Take 1 tablet by mouth once a day 2)  Hydrocodone-acetaminophen 10-325 Mg Tabs (Hydrocodone-acetaminophen) .... One tablet by mouth four times a day 3)  Simvastatin 20 Mg Tabs (Simvastatin) .... Take 1 tablet by mouth once a day 4)  Bisacodyl Ec 5 Mg Tbec (Bisacodyl) .... Take 1 tablet by mouth once a day 5)  Carisoprodol 350 Mg Tabs (Carisoprodol) .... One tablet by mouth every 8 hours as needed 6)  Voltaren 1 % Gel (Diclofenac sodium) .... Apply to affected area four times a day as needed 7)  Omeprazole 20 Mg Cpdr (Omeprazole) .... 2 by mouth two times a day 8)  Temazepam 30 Mg Caps (Temazepam) .... Take 1 capsule by mouth at bedtime 9)  Hydrochlorothiazide 25 Mg Tabs (Hydrochlorothiazide) .... Take 1/2  tablet by mouth once a day 10)  Klor-con M20 20 Meq Cr-tabs (Potassium chloride crys cr) .... Take 1 tablet by mouth once daily 11)  Chantix 0.5 Mg Tabs (Varenicline tartrate) .... One by mouth two times a day  Other Orders: Future Orders: T-CBC w/Diff ([id number]) ... 06/05/2010 T- * Misc. Laboratory test [id number]) ... 06/05/2010  Patient Instructions: 1)  Please schedule a follow-up appointment in 6 months. 2)  BMP prior to visit, ICD-9:  401.9 3)  TSH  - 244.9 4)  CBC w/ Diff prior to visit, ICD-9: 285.9 5)  SPEP - 285.9 6)  Please return for lab work one (1) week before your next appointment.   Current Allergies (reviewed today): ! NEURONTIN MAXZIDE-25  (TRIAMTERENE-HCTZ)   Preventive Care Screening  Mammogram:    Date:  10/18/2009    Results:  abnormal   Pap Smear:    Date:  09/27/2009    Results:  normal

## 2010-08-08 NOTE — Progress Notes (Signed)
Summary: Temazepam  Phone Note Call from Patient Call back at Home Phone [phone]   Caller: Patient Reason for Call: Refill Medication Summary of Call: patient called stating she would like rx sent to [facility].  Ok to send to [facility] Per Dr Artist Pais  Initial call taken by: Glendell Docker CMA,  April 27, 2009 11:12 AM  Follow-up for Phone Call        spoke with patient, and advised her rx oculd not be mailed or faxed electronically. The rx would be mailed to her. She verbalized understanding and is okay with having the rx mailed to her. Follow-up by: Glendell Docker CMA,  April 27, 2009 11:32 AM    Prescriptions: TEMAZEPAM 30 MG CAPS (TEMAZEPAM) one by mouth qhs  #90 x 3   Entered by:   Glendell Docker CMA   Authorized by:   D. Thomos Lemons DO   Signed by:   Glendell Docker CMA on 04/27/2009   Method used:   Print then Mail to Patient   RxID:   [id number]

## 2010-08-08 NOTE — Progress Notes (Signed)
Summary: [facility] pharmacy Refill Request   Phone Note Refill Request Message from:  Fax from Pharmacy on July 25, 2009 10:36 AM  Refills Requested: Medication #1:  HYDROCODONE-ACETAMINOPHEN 10-325 MG  TABS one tablet by mouth five times a day   Dosage confirmed as above?Dosage Confirmed   Supply Requested: 3 months [facility] Refill request   Next Appointment Scheduled: 07/27/09, Dr.Lakiyah Arntson Initial call taken by: Michaelle Copas,  July 25, 2009 10:36 AM  Follow-up for Phone Call        forward pain med refill request to her pain mgt specialist - Dr. Vear Clock Follow-up by: D. Thomos Lemons DO,  July 25, 2009 1:24 PM  Additional Follow-up for Phone Call Additional follow up Details #1::        Called Dr.Phillips office and got fax number & forwarded his office the pain med refill request Additional Follow-up by: Michaelle Copas,  July 25, 2009 3:04 PM

## 2010-08-08 NOTE — Progress Notes (Signed)
Summary: requests Rxs  Phone Note Call from Patient Call back at [phone]   Caller: Patient Call For: D. Thomos Lemons DO Summary of Call: Pt left voice message requesting refill on Temazepam sent to [facility]. States she was recently layed off from work and is under lot of stress. Would like rx for Xanax 5mg  sent to [facility] city.  Next appt is 06-13-10. Please advise.  Initial call taken by: Mervin Kung CMA Duncan Dull),  May 16, 2010 4:43 PM  Follow-up for Phone Call        temazepam and alprazolam are controlled substances pt needs OV within 6 months for refills Follow-up by: D. Thomos Lemons DO,  May 17, 2010 11:29 AM  Additional Follow-up for Phone Call Additional follow up Details #1::        Pt advised per Dr Olegario Messier instruction. Pt voices understanding and scheduled appointment for 05/26/10 @ 8:15. Pt requested early a.m. appt.  Nicki Guadalajara Fergerson CMA Duncan Dull)  May 17, 2010 1:53 PM

## 2010-08-08 NOTE — Progress Notes (Signed)
Summary: FYI  Phone Note Call from Patient Call back at [phone]   Caller: Patient Call For: D. Thomos Lemons DO Summary of Call: Pt left voice message stating she picked up the Sertraline and read side effects and has decided she will not take this medication. She states that she can get through the situation without medication. Nicki Guadalajara Fergerson CMA Duncan Dull)  May 29, 2010 11:37 AM   Follow-up for Phone Call        noted Follow-up by: D. Thomos Lemons DO,  May 29, 2010 12:13 PM

## 2010-08-08 NOTE — Progress Notes (Signed)
Summary: Potassium/ [facility]  Phone Note Refill Request Call back at Home Phone [phone] Message from:  Patient on July 13, 2009 9:57 AM  Refills Requested: Medication #1:  KLOR-CON M20 20 MEQ CR-TABS Take 1 tablet by mouth two times a day patient called and left voice message requesting a refill on potassium to [facility].   Method Requested: Electronic Next Appointment Scheduled: 07/27/2009 @ 8am Initial call taken by: Glendell Docker CMA,  July 13, 2009 9:58 AM  Follow-up for Phone Call        Rx completed in Dr. Tiajuana Amass Follow-up by: Glendell Docker CMA,  July 13, 2009 9:59 AM    Prescriptions: KLOR-CON M20 20 MEQ CR-TABS (POTASSIUM CHLORIDE CRYS CR) Take 1 tablet by mouth two times a day  #180 x 3   Entered by:   Glendell Docker CMA   Authorized by:   D. Thomos Lemons DO   Signed by:   Glendell Docker CMA on 07/13/2009   Method used:   Electronically to        [facility]* (mail-order)             ,          Ph: [phone]       Fax: [phone]   RxID:   [id number]

## 2010-08-08 NOTE — Progress Notes (Signed)
Summary:  Omeprazole  rx  Phone Note Call from Patient Call back at Home Phone [phone]   Caller: [phone] Call For: dr Artist Pais Summary of Call: per pt want a  rx for   Omeprazole 20 Mg Cpdr (Omeprazole) .... 2 by mouth once daily ac need a 3 month supply  Initial call taken by: Shelbie Proctor,  Nov 13, 2007 9:18 AM  Follow-up for Phone Call        ok to provide rx Follow-up by: D. Thomos Lemons DO,  Nov 13, 2007 4:39 PM    New/Updated Medications: OMEPRAZOLE 20 MG  CPDR (OMEPRAZOLE) 2 by mouth once daily ac   Prescriptions: OMEPRAZOLE 20 MG  CPDR (OMEPRAZOLE) 2 by mouth once daily ac  #180 x 3   Entered by:   Glendell Docker   Authorized by:   D. Thomos Lemons DO   Signed by:   Glendell Docker on 11/14/2007   Method used:   Electronically sent to ...       [facility]*       [address]       [place], [place]  [address]       Ph: [phone] or [phone]       Fax: [phone]   RxID:   [id number]

## 2010-08-08 NOTE — Assessment & Plan Note (Signed)
Summary: F/U   Vital Signs:  Patient Profile:   65 Years Old Female Height:     65.5 inches Weight:      158.25 pounds BMI:     26.03 Temp:     97.9 degrees F oral Pulse rate:   76 / minute Pulse rhythm:   regular Resp:     16 per minute BP sitting:   120 / 80  (right arm) Cuff size:   large  Vitals Entered By: Glendell Docker CMA (July 14, 2008 8:07 AM)                 PCP:  Dondra Spry DO  Chief Complaint:  Joan [name] up disease management.  History of Present Illness: Follow up disease management Pt reports she is having persistent insomnia.  No improvement with amitriptyline.    GERD - pt scheduled for EGD in Jan with Dr. Rudean Haskell - stable Hyperlipidemia - stable.    Current Allergies (reviewed today): ! NEURONTIN  Past Medical History:    Chronic Low Back pain - followed by pain mgt - Dr. Venia Carbon    Hypertension    Hyperlipidemia    Left knee advanced osteoarthritis    Tobacco Abuse    Hypothyroidism    History of Depression    History of Benign arrythmia (Cardiologist - Traci Turner)    Esophageal stricture     Sleep Apnea   Past Surgical History:    4 lumbar surgeries.      Lumbar fusion and rods    Caesarean section    Left ankle surgery 1995    History if implantable morphine pump    Hysterectomy         Social History:    Divorced    Current Smoker  1 ppd -  20 yrs    Alcohol use-no        Risk Factors:     Counseled to quit/cut down tobacco use:  yes    Physical Exam  General:     alert, well-developed, and well-nourished.   Head:     normocephalic and atraumatic.   Lungs:     normal respiratory effort and normal breath sounds.   Heart:     Regular rate and rhythm; no murmurs, rubs,  or bruits. Neurologic:     cranial nerves II-XII intact and strength normal in all extremities.   Psych:     normally interactive and good eye contact.      Impression & Recommendations:  Problem # 1:  INSOMNIA (ICD-780.52) Pt  with chronic insomnia.   Poor response to low dose TCA.  Trial of temazepam. The following medications were removed from the medication list:    Nighttime Sleep Aid 50 Mg Caps (Diphenhydramine hcl (sleep)) .Marland Kitchen... Take 1 tab by mouth at bedtime as needed  Her updated medication list for this problem includes:    Temazepam 15 Mg Caps (Temazepam) .Marland Kitchen... 1/2 to one tab by mouth at bedtime prn   Problem # 2:  HYPERTENSION (ICD-401.9) BP stable.   Maintain current medication regimen.  Her updated medication list for this problem includes:    Hydrochlorothiazide 25 Mg Tabs (Hydrochlorothiazide) .Marland Kitchen... Take 1 tablet by mouth once a day  BP today: 120/80 Prior BP: 126/70 (05/26/2008)  Labs Reviewed: Creat: 0.8 (05/06/2008) Chol: 196 (05/06/2008)   HDL: 50.0 (05/06/2008)   LDL: 78.9 (05/06/2008)   TG: 309 (05/06/2008)   Problem # 3:  HYPERLIPIDEMIA (ICD-272.4) Continue simvastatin.  Pt advised to start OTC fish oil two times a day.   Her updated medication list for this problem includes:    Simvastatin 20 Mg Tabs (Simvastatin) .Marland Kitchen... Take 1 tablet by mouth once a day  Labs Reviewed: Chol: 196 (05/06/2008)   HDL: 50.0 (05/06/2008)   LDL: 78.9 (05/06/2008)   TG: 309 (05/06/2008) SGOT: 20 (05/06/2008)   SGPT: 17 (05/06/2008)   Complete Medication List: 1)  Hydrochlorothiazide 25 Mg Tabs (Hydrochlorothiazide) .... Take 1 tablet by mouth once a day 2)  Estrace 1 Mg Tabs (Estradiol) .... Take 1 tablet by mouth once a day 3)  Levothyroxine Sodium 100 Mcg Tabs (Levothyroxine sodium) .... Take 1 tablet by mouth once a day 4)  Hydrocodone-acetaminophen 10-325 Mg Tabs (Hydrocodone-acetaminophen) .... One tablet by mouth every 6 hours as needed 5)  Simvastatin 20 Mg Tabs (Simvastatin) .... Take 1 tablet by mouth once a day 6)  Bisacodyl Ec 5 Mg Tbec (Bisacodyl) .... Take 1 tablet by mouth once a day 7)  Carisoprodol 350 Mg Tabs (Carisoprodol) .... One tablet by mouth every 8 hours as needed 8)   Voltaren 1 % Gel (Diclofenac sodium) .... Apply to affected area four times a day as needed 9)  Nicotrol 10 Mg Inha (Nicotine) .... Use 6 - 10 cartridges per day 10)  Omeprazole 20 Mg Cpdr (Omeprazole) .... 2 by mouth once daily ac 11)  Temazepam 15 Mg Caps (Temazepam) .... 1/2 to one tab by mouth at bedtime prn   Patient Instructions: 1)  Please schedule a follow-up appointment in 2 months.   Prescriptions: HYDROCHLOROTHIAZIDE 25 MG  TABS (HYDROCHLOROTHIAZIDE) Take 1 tablet by mouth once a day  #90 x 3   Entered and Authorized by:   D. Thomos Lemons DO   Signed by:   D. Thomos Lemons DO on 07/14/2008   Method used:   Electronically to        [facility]* (mail-order)             ,          Ph: [phone]       Fax: [phone]   RxID:   [id number] OMEPRAZOLE 20 MG  CPDR (OMEPRAZOLE) 2 by mouth once daily ac  #180 x 3   Entered and Authorized by:   D. Thomos Lemons DO   Signed by:   D. Thomos Lemons DO on 07/14/2008   Method used:   Electronically to        [facility]* (mail-order)             ,          Ph: [phone]       Fax: [phone]   RxID:   [id number] LEVOTHYROXINE SODIUM 100 MCG TABS (LEVOTHYROXINE SODIUM) Take 1 tablet by mouth once a day  #90 x 3   Entered and Authorized by:   D. Thomos Lemons DO   Signed by:   D. Thomos Lemons DO on 07/14/2008   Method used:   Electronically to        [facility]* (mail-order)             ,          Ph: [phone]       Fax: [phone]   RxID:   [id number] SIMVASTATIN 20 MG TABS (SIMVASTATIN) Take 1 tablet by mouth once a day  #90 x 3   Entered and Authorized by:   D. Thomos Lemons DO   Signed  by:   Dondra Spry DO on 07/14/2008   Method used:   Electronically to        [facility]* (mail-order)             ,          Ph: [phone]       Fax: [phone]   RxID:   [id number] TEMAZEPAM 15 MG CAPS (TEMAZEPAM) 1/2 to one tab by mouth at bedtime prn  #30 x 2   Entered and Authorized by:   D. Thomos Lemons DO   Signed by:   D. Thomos Lemons DO on 07/14/2008   Method used:   Printed then faxed to ...       [facility]* (retail)       [address]       [place], [place]  [id number]       Ph: [phone]       Fax: [phone]   RxID:   [id number]  ] Current Allergies (reviewed today): ! NEURONTIN   Preventive Care Screening  Last Flu Shot:    Date:  05/25/2008    Results:  given

## 2010-08-08 NOTE — Assessment & Plan Note (Signed)
Summary: per Dr Artist Pais  wt loss/hea   Vital Signs:  Patient profile:   65 year old female Weight:      128 pounds BMI:     21.05 O2 Sat:      100 % on Room air Temp:     98.0 degrees F Pulse rate:   72 / minute Pulse rhythm:   regular Resp:     16 per minute BP sitting:   124 / 70  (right arm) Cuff size:   regular  Vitals Entered By: Glendell Docker CMA (July 27, 2009 8:03 AM)  O2 Flow:  Room air  Primary Care Provider:  D. Thomos Lemons DO  CC:  Dicsuss labs and wt loss.  History of Present Illness: 65 y/o white female with hx of tobacco abuse and wt loss for f/u. her weight has stablized at 128 lbs. she has poor appetite prev CT of chest reviewed:  06/2009  IMPRESSION:   1.  No acute process or explanation for weight loss within the chest. 2.  Tiny left upper lobe lung nodules. Given risk factors for bronchogenic carcinoma, follow-up chest CT at 1 year is recommended.  last colonoscopy in 2007. she has hx of esophageal stricture.  tob use - she would like to try chantix again.  hx of thyroid nodule - u/s results reviewed  Allergies: 1)  ! Neurontin 2)  Maxzide-25 (Triamterene-Hctz)  Past History:  Past Medical History: Chronic Low Back pain - followed by pain mgt - Dr. Venia Carbon Hypertension Hyperlipidemia Left knee advanced osteoarthritis  Tobacco Abuse  Hypothyroidism History of Depression   History of Benign arrythmia (Cardiologist - Traci Turner) Esophageal stricture  Sleep Apnea      Past Surgical History: 4 lumbar surgeries.   Lumbar fusion and rods   Caesarean section Left ankle surgery 1995 History if implantable morphine pump  Partial Hysterectomy     Family History: Father deceased age 38 - pancreatic cancer Mother deceased age 17 - DM II, Htn, CAD Sister with diabetes      Social History: Divorced Current Smoker  1 ppd -  20 yrs   Alcohol use-no      Review of Systems       The patient complains of dyspnea on exertion.  The  patient denies chest pain, melena, and hematochezia.    Physical Exam  General:  alert, well-developed, and well-nourished.   Neck:  no masses and no carotid bruits.   Lungs:  normal respiratory effort and normal breath sounds.   Heart:  normal rate, regular rhythm, and no gallop.   Abdomen:  soft, non-tender, no masses, no hepatomegaly, and no splenomegaly.   Extremities:  No lower extremity edema  Psych:  normally interactive, good eye contact, not anxious appearing, and not depressed appearing.     Impression & Recommendations:  Problem # 1:  WEIGHT LOSS, ABNORMAL (ICD-783.21) Unexplained wt loss.  she has hx of esophageal stricture.  refer to GI.   she is also due for f/u colonoscopy.  Orders: Gastroenterology Referral (GI)  Problem # 2:  TOBACCO ABUSE (ICD-305.1) Check PFTs.  Severe copd may be contributing factor for wt loss Her updated medication list for this problem includes:    Chantix 0.5 Mg Tabs (Varenicline tartrate) ..... One by mouth once daily x 3 days, then one by mouth bid  Orders: Pulmonary Referral (Pulmonary)  Encouraged smoking cessation and discussed different methods for smoking cessation.   Problem # 3:  HYPERTENSION (ICD-401.9) stable.  Maintain current medication regimen.  Her updated medication list for this problem includes:    Hydrochlorothiazide 25 Mg Tabs (Hydrochlorothiazide) .Marland Kitchen... Take 1 tablet by mouth once a day (patient to verify dosage)  BP today: 124/70 Prior BP: 120/80 (06/08/2009)  Labs Reviewed: K+: 3.8 (06/08/2009) Creat: : 0.77 (06/08/2009)   Chol: 146 (02/07/2009)   HDL: 51.60 (02/07/2009)   LDL: 70 (02/07/2009)   TG: 121.0 (02/07/2009)  Problem # 4:  THYROID NODULE, LEFT (ICD-241.0) recent u/s shows stable thyroid nodule.   06/28/2009  Stable solid nodule in the lower pole on the left.  No other thyroid nodule is seen.  Complete Medication List: 1)  Estrace 1 Mg Tabs (Estradiol) .... Take 1/2  tablet by mouth once a  day 2)  Levothyroxine Sodium 125 Mcg Tabs (Levothyroxine sodium) .... One by mouth qd 3)  Hydrocodone-acetaminophen 10-325 Mg Tabs (Hydrocodone-acetaminophen) .... One tablet by mouth five times a day 4)  Simvastatin 20 Mg Tabs (Simvastatin) .... Take 1 tablet by mouth once a day 5)  Bisacodyl Ec 5 Mg Tbec (Bisacodyl) .... Take 1 tablet by mouth once a day 6)  Carisoprodol 350 Mg Tabs (Carisoprodol) .... One tablet by mouth every 8 hours as needed 7)  Voltaren 1 % Gel (Diclofenac sodium) .... Apply to affected area four times a day as needed 8)  Omeprazole 20 Mg Cpdr (Omeprazole) .... 2 by mouth two times a day 9)  Temazepam 30 Mg Caps (Temazepam) .... One by mouth qhs 10)  Hydrochlorothiazide 25 Mg Tabs (Hydrochlorothiazide) .... Take 1 tablet by mouth once a day (patient to verify dosage) 11)  Klor-con M20 20 Meq Cr-tabs (Potassium chloride crys cr) .... Take 1 tablet by mouth two times a day 12)  Chantix 0.5 Mg Tabs (Varenicline tartrate) .... One by mouth once daily x 3 days, then one by mouth bid  Patient Instructions: 1)  Please schedule a follow-up appointment in 6 months. 2)  BMP prior to visit, ICD-9:  783.21 3)  Hepatic Panel prior to visit, ICD-9: 783.21 4)  TSH prior to visit, ICD-9: 783.21 5)  Free T4: 783.21 6)  CBC w/ Diff prior to visit, ICD-9: 783.21 7)  Please return for lab work one (1) week before your next appointment.       Current Allergies (reviewed today): ! NEURONTIN MAXZIDE-25 (TRIAMTERENE-HCTZ)

## 2010-08-08 NOTE — Progress Notes (Signed)
Summary: ? re prep  Phone Note Call from Patient Call back at Home Phone [phone]   Caller: Patient Call For: Leone Payor Reason for Call: Talk to Nurse Summary of Call: Patient wants ot know if theres a cheper prep that she can use Initial call taken by: Tawni Levy,  September 08, 2009 2:09 PM  Follow-up for Phone Call        Spoke with pt; stated that Moviprep is Dr. Marvell Fuller preferred prep.  Stressed the importance of using this prep in order to get best results possible with colonoscopy.  $20.00 rebate coupon offered and pt states "I guess I will take it." Writer told pt that coupon would be mailed today.  Understanding voiced.  Pt also has questions about insurance; call tranfered to Dwan Bolt to try to answer question Follow-up by: Karl Bales RN,  September 08, 2009 2:46 PM     Appended Document: ? re prep Moviprep rebate mailed to pt 09-08-09

## 2010-08-08 NOTE — Assessment & Plan Note (Signed)
Summary: EGD and Colonoscopy, Dr. Laural Benes   NAME:  Joan [name], Joan [name]             ACCOUNT NO.:  [id number]   MEDICAL RECORD NO.:  [id number]          PATIENT TYPE:  AMB   LOCATION:  ENDO                         FACILITY:  [facility]   PHYSICIAN:  Danise Edge, M.D.   DATE OF BIRTH:  1946/05/26   DATE OF PROCEDURE:  08/28/2004  DATE OF DISCHARGE:                                 OPERATIVE REPORT   PROCEDURE:  Esophagogastroduodenoscopy and colonoscopy with polypectomy.   INDICATIONS:  This Joan [name] is a 65 year old female born 04-22-1946.  Joan [name] has had heartburn for years. She denies dysphagia, odynophagia,  vomiting or gastrointestinal bleeding. Her current morning dose of Protonix  prevents heartburn through the day evening. She occasionally has  breakthrough regurgitation. She is scheduled to undergo a screening  colonoscopy with polypectomy to prevent colon cancer. I will also perform an  esophagogastroduodenoscopy to rule out Barrett's esophagus.   MEDICATION ALLERGIES:  None.   CHRONIC MEDICATIONS:  Protonix, Synthroid, Premarin, Fluoxetine, Avinza,  carisoprodol, __________, Alfonso Patten.   PAST MEDICAL HISTORY:  Four back operations, multinodular goiter, left knee  surgery, cesarean section, hysterectomy, left arm surgery, left ankle  surgery.   HABITS:  Mr. [name] has been a two  pack per day cigarette smoker for 25  years. She does not consume alcohol.   FAMILY HISTORY:  Father died age 65 of pancreatic cancer. Mother died at age  35; she has hypertension and diabetes. Joan [name] has two sisters and two  brothers   ENDOSCOPIST:  Danise Edge, M.D.   PREMEDICATION:  Versed 12 mg, Demerol 100 mg.   PROCEDURE:  DIAGNOSTIC ESOPHAGOGASTRODUODENOSCOPY:  After obtaining informed  consent, Joan [name] was placed in the left lateral decubitus position. I  administered intravenous Demerol and intravenous Versed to achieve conscious  sedation for the procedure. The  patient's blood pressure, oxygen saturation  and cardiac rhythm were monitored throughout the procedure documented in the  medical record.   The Olympus gastroscope was passed through the posterior hypopharynx and the  proximal esophagus without difficulty. I did not visualize the vocal cords.   ESOPHAGOSCOPY:  The proximal mid and lower segments of the esophageal mucosa  appeared normal. The squamocolumnar junction and esophagogastric junction  are noted at 40 cm from the incisor teeth. There is no endoscopic evidence  for the presence of erosive esophagitis or Barrett's esophagus.   GASTROSCOPY:  Retroflex view of the gastric cardia and fundus was normal.  The gastric body, antrum and pylorus appeared normal.   DUODENOSCOPY:  The duodenal bulb and descending duodenum appear normal.   ASSESSMENT:  Normal esophagogastroduodenoscopy.   PROCEDURE:  PROCTOCOLONOSCOPY:  Anal inspection and digital rectal exam were  normal. The Olympus adjustable pediatric colonoscope was introduced into the  rectum and advanced to the cecum. Colonic preparation for the exam today was  excellent.   RECTUM:  Normal.  SIGMOID COLON AND DESCENDING COLON:  At approximately 55 cm from the anal  verge, two 2 mm polyps were removed with the electrocautery snare.  SPLENIC FLEXURE:  Normal.  TRANSVERSE COLON: Normal.  HEPATIC FLEXURE:  Normal.  ASCENDING COLON:  Normal.  CECUM AND ILEOCECAL VALVE:  Normal.   ASSESSMENT:  Two small polyps were removed from the left colon at 55 cm from  the anal verge; otherwise normal screening proctocolonoscopy to the cecum.      MJ/MEDQ  D:  08/28/2004  T:  08/28/2004  Job:  [id number]   cc:   Annabell Howells, MD  Fax: [phone]  Appended Document: EGD and Colonoscopy, Dr. Laural Benes polyps adenomatous

## 2010-08-08 NOTE — Miscellaneous (Signed)
Summary: previsit  Clinical Lists Changes  Observations: Added new observation of ALLERGY REV: Done (08/02/2008 13:04)

## 2010-08-08 NOTE — Letter (Signed)
    [facility] [address] [place], [place]  [address] Phone: [phone]      October 21, 2007   ADILEE LEMME [address] [place], [place] [address]  RE:  LAB RESULTS  Dear  Ms. Boniface,  The following is an interpretation of your most recent lab tests.  Please take note of any instructions provided or changes to medications that have resulted from your lab work.  ELECTROLYTES:  Good - no changes needed  KIDNEY FUNCTION TESTS:  Good - no changes needed  LIVER FUNCTION TESTS:  Good - no changes needed  LIPID PANEL:  Good - no changes needed Triglyceride: 102   Cholesterol: 165   LDL: 93   HDL: 51.9   Chol/HDL%:  3.2 CALC  THYROID STUDIES:  Thyroid studies normal TSH: 2.20     DIABETIC STUDIES:  Good - no changes needed Blood Glucose: 99    CBC:  Stable - no changes needed   I have reviewed these labs in Dr. Olegario Messier absence. If you have any questions please call him.    Sincerely Yours,    Jacques Navy MD

## 2010-08-08 NOTE — Assessment & Plan Note (Signed)
Summary: 2 MTH FU-STC   Vital Signs:  Patient Profile:   65 Years Old Female Height:     65.5 inches Weight:      158.25 pounds BMI:     26.03 Temp:     98.4 degrees F oral Pulse rate:   71 / minute BP sitting:   113 / 66  (right arm)  Vitals Entered By: Glendell Docker (October 24, 2007 3:30 PM)                 Chief Complaint:  2 MONTH FOLLOW UP  and Abdominal pain.  History of Present Illness:  Abdominal Pain      This is a 65 year old woman who presents with Abdominal pain.  The patient denies nausea, vomiting, melena, and hematochezia.  The location of the pain is epigastric.  The pain is described as intermittent.  The patient denies the following symptoms: fever.  She has been taking alleve for aches and pains.    Current Allergies (reviewed today): ! NEURONTIN  Past Medical History:    Reviewed history from 08/12/2007 and no changes required:       Chronic Low Back pain - followed by pain mgt - Dr. Venia Carbon       Hypertension       Hyperlipidemia       Left knee advanced osteoarthritis       Tobacco Abuse       Hypothyroidism       History of Depression       History of Benign arrythmia (Cardiologist - Traci Turner)       Esophageal stricture   Family History:    Reviewed history from 08/12/2007 and no changes required:       Father deceased age 60 - pancreatic cancer       Mother deceased age 32 - DM II, Htn, CAD       Sister with diabetes  Social History:    Occupation:    Divorced    Review of Systems      See HPI   Physical Exam  General:     alert, well-developed, and well-nourished.   Head:     normocephalic and atraumatic.   Mouth:     Oral mucosa and oropharynx without lesions or exudates.  Lungs:     normal respiratory effort and normal breath sounds.   Heart:     normal rate, regular rhythm, and no murmur.   Abdomen:     mild epigastric tendernesssoft, no guarding, no rigidity, and no rebound tenderness.   Extremities:     No  lower extremity edema  Neurologic:     alert & oriented X3 and cranial nerves II-XII intact.   Psych:     normally interactive and good eye contact.      Impression & Recommendations:  Problem # 1:  ABDOMINAL PAIN (ICD-789.00) I suspect NSAID gastritis.  DC alleve.  Omeprazole 40mg  by mouth once daily.  Patient advised to call office if symptoms worsen.   Problem # 2:  ANEMIA, MILD (ICD-285.9) Pt with mild normocytic anemia.  Start MVI and iron supplement.  Pt reports hx of normal colonoscopy and EGD.  Repeat CBC in 2 months.  Hgb: 11.4 (10/20/2007)   Hct: 34.2 (10/20/2007)   RDW: 12.7 (10/20/2007)   MCV: 90.4 (10/20/2007)   MCHC: 33.4 (10/20/2007) TSH: 2.20 (10/20/2007)   Complete Medication List: 1)  Hydrochlorothiazide 25 Mg Tabs (Hydrochlorothiazide) .... Take 1 tablet by mouth  once a day 2)  Estrace 1 Mg Tabs (Estradiol) .... Take 1 tablet by mouth once a day 3)  Synthroid 100 Mcg Tabs (Levothyroxine sodium) .... Take 1 tablet by mouth once a day 4)  Hydrocodone-acetaminophen 10-325 Mg Tabs (Hydrocodone-acetaminophen) .... One tablet by mouth every 6 hours as needed 5)  Zocor 20 Mg Tabs (Simvastatin) .... Take 1 tablet by mouth once a day 6)  Bisacodyl Ec 5 Mg Tbec (Bisacodyl) .... Take 1 tablet by mouth once a day 7)  Carisoprodol 350 Mg Tabs (Carisoprodol) .... One tablet by mouth every 8 hours as needed 8)  Voltaren 1 % Gel (Diclofenac sodium) .... Apply to affected area four times a day as needed 9)  Nicotrol 10 Mg Inha (Nicotine) .... Use 6 - 10 cartridges per day 10)  Omeprazole 20 Mg Cpdr (Omeprazole) .... 2 by mouth once daily ac   Patient Instructions: 1)  Call office within 1 week if your abdominal pain is not better. 2)  Stop Alleve 3)  Take multivitamin with iron. 4)  Please schedule a follow-up appointment in 6 months. 5)  CBC w/ Diff prior to visit, ICD-9:  285.9 in 2 months.    Prescriptions: OMEPRAZOLE 20 MG  CPDR (OMEPRAZOLE) 2 by mouth once daily ac   #60 x 5   Entered and Authorized by:   D. Thomos Lemons DO   Signed by:   D. Thomos Lemons DO on 10/24/2007   Method used:   Electronically sent to ...       [facility]*       [address]       [place], [place]  [address]       Ph: [phone] or [phone]       Fax: [phone]   RxID:   [id number]  ] Current Allergies (reviewed today): ! NEURONTIN

## 2010-08-08 NOTE — Assessment & Plan Note (Signed)
Summary: f/u   Vital Signs:  Patient Profile:   65 Years Old Female Height:     65.5 inches Weight:      163.25 pounds BMI:     26.85 Temp:     98.1 degrees F oral Pulse rate:   82 / minute Pulse rhythm:   regular Resp:     16 per minute BP sitting:   120 / 70  (right arm) Cuff size:   regular  Vitals Entered By: Glendell Docker CMA (May 06, 2008 8:16 AM)                 PCP:  Dondra Spry DO  Chief Complaint:  Follow up disease management & c/o trouble staying asleep.  History of Present Illness: 65 y/o white female for follow up.   Pt reports her abd pain has improved since stopping alleve and starting omeprazole.   She reports intermittent dysphagia.   She has noticed with bread or biscuit.  No wt loss.  Pt also complains of chronic insomnia.   She is able to fall asleep but wake up 2 am.   She denies specific trigger.  She has tried [place] in the past.  Ambien caused sleep walking.    Current Allergies (reviewed today): ! NEURONTIN  Past Medical History:    Chronic Low Back pain - followed by pain mgt - Dr. Venia Carbon    Hypertension    Hyperlipidemia    Left knee advanced osteoarthritis    Tobacco Abuse    Hypothyroidism    History of Depression    History of Benign arrythmia (Cardiologist - Traci Turner)    Esophageal stricture   Past Surgical History:    4 lumbar surgeries.      Lumbar fusion and rods    Caesarean section    Left ankle surgery 1995    History if implantable morphine pump    Hysterectomy        Social History:    Divorced    Current Smoker  1 ppd -  20 yrs    Alcohol use-no   Risk Factors:  Tobacco use:  current Alcohol use:  no  Mammogram History:     Date of Last Mammogram:  02/24/2008    Results:  abnormal-Fibrocystic Breast 6 month recall   PAP Smear History:     Date of Last PAP Smear:  02/24/2008    Results:  normal    Review of Systems      See HPI   Physical Exam  General:     alert, well-developed,  and well-nourished.   Head:     normocephalic and atraumatic.   Mouth:     Oral mucosa and oropharynx without lesions or exudates.  Neck:     supple and no masses.   Lungs:     normal respiratory effort and normal breath sounds.   Heart:     normal rate, regular rhythm, and no murmur.   Abdomen:     soft and non-tender.   Extremities:     No lower extremity edema  Neurologic:     alert & oriented X3 and cranial nerves II-XII intact.   Psych:     normally interactive, good eye contact, not anxious appearing, and not depressed appearing.      Impression & Recommendations:  Problem # 1:  DYSPHAGIA UNSPECIFIED (ICD-787.20) 65 y/o with hx of probable NSAID gastritis and tob abuse with intermittent dysphagia.   Refer to GI for  EGD. Orders: Gastroenterology Referral (GI)   Problem # 2:  HYPERTENSION (ICD-401.9) BP stable.   Maintain current medication regimen.  Her updated medication list for this problem includes:    Hydrochlorothiazide 25 Mg Tabs (Hydrochlorothiazide) .Marland Kitchen... Take 1 tablet by mouth once a day  Orders: TLB-BMP (Basic Metabolic Panel-BMET) (80048-METABOL)  BP today: 120/70 Prior BP: 113/66 (10/24/2007)  Labs Reviewed: Creat: 0.8 (10/20/2007) Chol: 165 (10/20/2007)   HDL: 51.9 (10/20/2007)   LDL: 93 (10/20/2007)   TG: 102 (10/20/2007)   Problem # 3:  TOBACCO ABUSE (ICD-305.1)  Her updated medication list for this problem includes:    Nicotrol 10 Mg Inha (Nicotine) ..... Use 6 - 10 cartridges per day Encouraged smoking cessation and discussed different methods for smoking cessation.  I urged pt to get flu vaccine.  Problem # 4:  INSOMNIA (ICD-780.52) She is able to fall asleep but wake up 2 am.   She denies specific trigger.  She has tried [place] in the past.  Ambien caused sleep walking.  Discussed sleep hygiene.  Trial of low dose amitriptyline  Complete Medication List: 1)  Hydrochlorothiazide 25 Mg Tabs (Hydrochlorothiazide) .... Take 1 tablet by  mouth once a day 2)  Estrace 1 Mg Tabs (Estradiol) .... Take 1 tablet by mouth once a day 3)  Synthroid 100 Mcg Tabs (Levothyroxine sodium) .... Take 1 tablet by mouth once a day 4)  Hydrocodone-acetaminophen 10-325 Mg Tabs (Hydrocodone-acetaminophen) .... One tablet by mouth every 6 hours as needed 5)  Zocor 20 Mg Tabs (Simvastatin) .... Take 1 tablet by mouth once a day 6)  Bisacodyl Ec 5 Mg Tbec (Bisacodyl) .... Take 1 tablet by mouth once a day 7)  Carisoprodol 350 Mg Tabs (Carisoprodol) .... One tablet by mouth every 8 hours as needed 8)  Voltaren 1 % Gel (Diclofenac sodium) .... Apply to affected area four times a day as needed 9)  Nicotrol 10 Mg Inha (Nicotine) .... Use 6 - 10 cartridges per day 10)  Omeprazole 20 Mg Cpdr (Omeprazole) .... 2 by mouth once daily ac 11)  Amitriptyline Hcl 10 Mg Tabs (Amitriptyline hcl) .... One by mouth at bedtime prn  Other Orders: TLB-TSH (Thyroid Stimulating Hormone) (84443-TSH) TLB-CBC Platelet - w/Differential (85025-CBCD) TLB-ALT (SGPT) (84460-ALT) TLB-AST (SGOT) (84450-SGOT) TLB-Lipid Panel (80061-LIPID)   Patient Instructions: 1)  Please schedule a follow-up appointment in 2 months.   Prescriptions: AMITRIPTYLINE HCL 10 MG TABS (AMITRIPTYLINE HCL) one by mouth at bedtime prn  #30 x 2   Entered and Authorized by:   D. Thomos Lemons DO   Signed by:   D. Thomos Lemons DO on 05/06/2008   Method used:   Electronically to        [facility]* (retail)       [address]       [place], [place]  [id number]       Ph: [phone]       Fax: [phone]   RxID:   [id number]  ] Current Allergies (reviewed today): ! NEURONTIN   Preventive Care Screening  Mammogram:    Date:  02/24/2008    Results:  abnormal-Fibrocystic Breast 6 month recall   Pap Smear:    Date:  02/24/2008    Results:  normal

## 2010-08-08 NOTE — Progress Notes (Signed)
Summary: Temazepam Refill  Phone Note Call from Patient Call back at Home Phone [phone]   Caller: Mom Reason for Call: Refill Medication Summary of Call: patient called and left voice message requesting refill on her Temazepam Initial call taken by: Glendell Docker CMA,  April 21, 2009 8:55 AM  Follow-up for Phone Call        ok to refill x 3 Follow-up by: D. Thomos Lemons DO,  April 21, 2009 9:35 AM  Additional Follow-up for Phone Call Additional follow up Details #1::        Phone Call Completed, Rx Called In Additional Follow-up by: Glendell Docker CMA,  April 21, 2009 5:16 PM    Prescriptions: TEMAZEPAM 30 MG CAPS (TEMAZEPAM) one by mouth qhs  #30 x 3   Entered by:   Glendell Docker CMA   Authorized by:   D. Thomos Lemons DO   Signed by:   Glendell Docker CMA on 04/21/2009   Method used:   Telephoned to ...       [facility]* (retail)       [address]       [place], [place]  [id number]       Ph: [phone]       Fax: [phone]   RxID:   [id number]

## 2010-08-09 ENCOUNTER — Other Ambulatory Visit: Payer: Self-pay | Admitting: Obstetrics and Gynecology

## 2010-08-09 DIAGNOSIS — Z09 Encounter for follow-up examination after completed treatment for conditions other than malignant neoplasm: Secondary | ICD-10-CM

## 2010-08-10 NOTE — Progress Notes (Signed)
Summary: Test Results  Phone Note Call from Patient Call back at Home Phone [phone]   Caller: Patient Call For: D. Thomos Lemons DO Summary of Call: patient call requested  results from CT and [place] results done this morning.  Initial call taken by: Glendell Docker CMA,  June 23, 2010 10:28 AM  Follow-up for Phone Call        thyroid u/s - stable.  no change in left thyroid nodule.  I suggest we repeat thyroid u/s in 1 yr  CT of chest - no change in pulm nodule. radiologist recommends final follow up CT in 1 yr to ensure stability Follow-up by: D. Thomos Lemons DO,  June 23, 2010 5:08 PM  Additional Follow-up for Phone Call Additional follow up Details #1::        call returned to patient at [phone], she has been  informed per Dr Artist Pais instructions Additional Follow-up by: Glendell Docker CMA,  June 23, 2010 5:14 PM

## 2010-08-10 NOTE — Progress Notes (Signed)
Summary: Clarification on refill  Phone Note Refill Request Message from:  Fax from Pharmacy on June 30, 2010 8:26 AM  Refills Requested: Medication #1:  TEMAZEPAM 30 MG CAPS Take 1 capsule by mouth at bedtime as needed   Dosage confirmed as above?Dosage Confirmed   Brand Name Necessary? No   Supply Requested: 1 month   Last Refilled: 05/26/2010 [facility] [address] [place] [address] fax [phone]   Method Requested: Electronic Next Appointment Scheduled: 07-18-10 Dr Artist Pais  Initial call taken by: Roselle Locus,  June 30, 2010 8:27 AM  Follow-up for Phone Call        call placed to patient at [phone], no answer. A detailed voice message was left for patient to return call. She was informed a rx was provided to her at last office visit. Clarification was needed.  Follow-up by: Glendell Docker CMA,  June 30, 2010 9:10 AM  Additional Follow-up for Phone Call Additional follow up Details #1::        Pt states pharmacy filled Temazepam rx for #30 x no refills. 05/26/10 Rx was for #90 x no refills. Spoke to [place] [facility] and she verified pt was given #30 with no refills. She will fill for #30 x 1 refill to equal the quantity of 90 on the original rx.  Pt notified.  Nicki Guadalajara Fergerson CMA Duncan Dull)  June 30, 2010 9:21 AM

## 2010-08-10 NOTE — Assessment & Plan Note (Signed)
Summary: 6 month follow up/mhf rsch per Dr Brendaly Townsel/dt   Vital Signs:  Patient profile:   65 year old female Height:      65.5 inches Weight:      120.50 pounds BMI:     19.82 O2 Sat:      99 % on Room air Temp:     98.3 degrees F oral Pulse rate:   95 / minute Resp:     18 per minute BP sitting:   104 / 60  (right arm) Cuff size:   regular  Vitals Entered By: Glendell Docker CMA (July 18, 2010 3:00 PM)  O2 Flow:  Room air CC: 6 Month Follow up   Primary Care Provider:  Dondra Spry DO  CC:  6 Month Follow up.  History of Present Illness: 65 y/o female for f/u re:  adjustment disorder patient picked up rx for Sertraline, but did not start after reading the side affects  pt suffered fall over holidays - fell in shower    Preventive Screening-Counseling & Management  Alcohol-Tobacco     Smoking Status: current  Allergies: 1)  ! Neurontin 2)  Maxzide-25 Associate Professor)  Past History:  Past Medical History: Chronic Low Back pain - followed by pain mgt - Dr. Venia Carbon Hypertension  Hyperlipidemia  Left knee advanced osteoarthritis  Tobacco Abuse  Hypothyroidism   History of Depression   History of Benign arrythmia (Cardiologist - Traci Turner) Esophageal stricture  Sleep Apnea   Left thyroid nodule  Past Surgical History: 4 lumbar surgeries.   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   Neck:  no masses and no carotid bruits.   Lungs:   normal respiratory effort and normal breath sounds.   Heart:  normal rate, regular rhythm, no murmur, and no gallop.     Impression & Recommendations:  Problem # 1:  THYROID NODULE, LEFT (ICD-241.0) Assessment Unchanged ight thyroid lobe:  13 x 18 x 47 mm Left thyroid lobe:  10 x 15 x 43 mm Isthmus:  3 mm in thickness  repeat thyroid u/s shows:  Focal nodules:  7 x 12 x 16 mm solid with coarse calcification, inferior left (previously 7 x 13 x 14 mm)   Lymphadenopathy:   None seen   IMPRESSION:   1.  Little interval change in the left thyroid nodule.  No newlesion.  refer to endo for mgt / surveillance  Problem # 2:  HYPERTENSION (ICD-401.9)  Her updated medication list for this problem includes:    Hydrochlorothiazide 25 Mg Tabs (Hydrochlorothiazide) .Marland Kitchen... Take 1  tablet by mouth once a day  Future Orders: T-Basic Metabolic Panel [id number]) ... 12/12/2010  BP today: 104/60 Prior BP: 122/70 (05/26/2010)  Labs Reviewed: K+: 3.6 (07/11/2010) Creat: : 0.74 (07/11/2010)   Chol: 146 (02/07/2009)   HDL: 51.60 (02/07/2009)   LDL: 70 (02/07/2009)   TG: 121.0 (02/07/2009)  Problem #  3:  ADJUSTMENT DISORDER WITH ANXIOUS MOOD [phone]) Assessment: Improved pt never started sertraline due to fear of side effects pt coping well  Complete Medication List: 1)  Levothyroxine Sodium 125 Mcg Tabs (Levothyroxine sodium) .... Take 1 tablet by mouth once a day 2)  Hydrocodone-acetaminophen 10-325 Mg Tabs (Hydrocodone-acetaminophen) .... One tablet by mouth four times a day 3)  Simvastatin 20 Mg Tabs (Simvastatin) .... Take 1 tablet by mouth once a day 4)  Bisacodyl Ec 5 Mg Tbec (Bisacodyl) .... Take 1 tablet by mouth once a day 5)  Carisoprodol 350 Mg Tabs (Carisoprodol) .... One tablet by mouth every 6 hours as needed 6)  Voltaren 1 % Gel (Diclofenac sodium) .... Apply to affected area four times a day as needed 7)  Omeprazole 20 Mg Cpdr (Omeprazole) .... 2 by mouth once daily 8)   Temazepam 30 Mg Caps (Temazepam) .... Take 1 capsule by mouth at bedtime as needed 9)  Hydrochlorothiazide 25 Mg Tabs (Hydrochlorothiazide) .... Take 1  tablet by mouth once a day 10)  Klor-con M20 20 Meq Cr-tabs (Potassium chloride crys cr) .... Take 1 tablet by mouth once daily 11)  Sertraline Hcl 25 Mg Tabs (Sertraline hcl) .... 1/2 by mouth once daily x 7 days, then 1 tab by mouth once daily  Other Orders: Endocrinology Referral (Endocrine) Future Orders: T-Hepatic Function [id number]) ... 12/12/2010 T-Lipid Profile [id number]) ... 12/12/2010 T-TSH [id number]) ... 12/12/2010  Patient Instructions: 1)  Please schedule a follow-up appointment in 6 months. 2)  BMP prior to visit, ICD-9: 401.9 3)  Hepatic Panel prior to visit, ICD-9: 272.4 4)  Lipid Panel prior to visit, ICD-9: 272.4 5)  TSH prior to visit, ICD-9: 244.90 6)  Please return for lab work one (1) week before your next appointment.    Orders Added: 1)  T-Basic Metabolic Panel [[id number]] 2)  T-Hepatic Function [[id number]] 3)  T-Lipid Profile [[id number]] 4)  T-TSH [[id number]] 5)  Endocrinology Referral [Endocrine] 6)  Est. Patient Level III [01027]   Immunization History:  Influenza Immunization History:    Influenza:  historical (04/18/2010)   Immunization History:  Influenza Immunization History:    Influenza:  Historical (04/18/2010)  Current Allergies (reviewed today): ! NEURONTIN MAXZIDE-25 (TRIAMTERENE-HCTZ)

## 2010-08-16 ENCOUNTER — Encounter: Payer: Self-pay | Admitting: Internal Medicine

## 2010-08-17 ENCOUNTER — Other Ambulatory Visit: Payer: Self-pay | Admitting: Internal Medicine

## 2010-08-17 ENCOUNTER — Ambulatory Visit
Admission: RE | Admit: 2010-08-17 | Discharge: 2010-08-17 | Disposition: A | Discharge status: Home / Self Care | Payer: BC Managed Care – PPO | Source: Ambulatory Visit | Attending: Obstetrics and Gynecology | Admitting: Obstetrics and Gynecology

## 2010-08-17 DIAGNOSIS — Z09 Encounter for follow-up examination after completed treatment for conditions other than malignant neoplasm: Secondary | ICD-10-CM

## 2010-08-17 DIAGNOSIS — E041 Nontoxic single thyroid nodule: Secondary | ICD-10-CM

## 2010-08-30 ENCOUNTER — Ambulatory Visit
Admission: RE | Admit: 2010-08-30 | Discharge: 2010-08-30 | Disposition: A | Discharge status: Home / Self Care | Payer: Medicare Other | Source: Ambulatory Visit | Attending: Internal Medicine | Admitting: Internal Medicine

## 2010-08-30 ENCOUNTER — Other Ambulatory Visit (HOSPITAL_COMMUNITY)
Admission: RE | Admit: 2010-08-30 | Discharge: 2010-08-30 | Disposition: A | Discharge status: Home / Self Care | Payer: Medicare Other | Source: Ambulatory Visit | Attending: Diagnostic Radiology | Admitting: Diagnostic Radiology

## 2010-08-30 ENCOUNTER — Other Ambulatory Visit: Payer: Self-pay | Admitting: Diagnostic Radiology

## 2010-08-30 DIAGNOSIS — E049 Nontoxic goiter, unspecified: Secondary | ICD-10-CM | POA: Insufficient documentation

## 2010-08-30 DIAGNOSIS — E041 Nontoxic single thyroid nodule: Secondary | ICD-10-CM

## 2010-09-14 NOTE — Consult Note (Signed)
Summary: [facility]   Imported By: Maryln Gottron 09/06/2010 15:10:54  _____________________________________________________________________  External Attachment:    Type:   Image     Comment:   External Document

## 2010-09-26 ENCOUNTER — Other Ambulatory Visit: Payer: Self-pay | Admitting: Obstetrics and Gynecology

## 2010-10-11 LAB — DIFFERENTIAL
Basophils Absolute: 0 10*3/uL (ref 0.0–0.1)
Basophils Relative: 1 % (ref 0–1)
Eosinophils Absolute: 0.1 10*3/uL (ref 0.0–0.7)
Eosinophils Relative: 1 % (ref 0–5)
Lymphocytes Relative: 35 % (ref 12–46)
Lymphs Abs: 1.6 10*3/uL (ref 0.7–4.0)
Monocytes Absolute: 0.4 10*3/uL (ref 0.1–1.0)
Monocytes Relative: 10 % (ref 3–12)
Neutro Abs: 2.4 10*3/uL (ref 1.7–7.7)
Neutrophils Relative %: 54 % (ref 43–77)

## 2010-10-11 LAB — CBC
HCT: 36.7 % (ref 36.0–46.0)
Hemoglobin: 13 g/dL (ref 12.0–15.0)
MCHC: 35.3 g/dL (ref 30.0–36.0)
MCV: 91.8 fL (ref 78.0–100.0)
Platelets: 206 10*3/uL (ref 150–400)
RBC: 3.99 MIL/uL (ref 3.87–5.11)
RDW: 13.1 % (ref 11.5–15.5)
WBC: 4.4 10*3/uL (ref 4.0–10.5)

## 2010-10-11 LAB — POCT I-STAT, CHEM 8
BUN: 18 mg/dL (ref 6–23)
Calcium, Ion: 1.2 mmol/L (ref 1.12–1.32)
Chloride: 107 mEq/L (ref 96–112)
Creatinine, Ser: 0.2 mg/dL — ABNORMAL LOW (ref 0.4–1.2)
Glucose, Bld: 100 mg/dL — ABNORMAL HIGH (ref 70–99)
HCT: 40 % (ref 36.0–46.0)
Hemoglobin: 13.6 g/dL (ref 12.0–15.0)
Potassium: 4.5 mEq/L (ref 3.5–5.1)
Sodium: 141 mEq/L (ref 135–145)
TCO2: 27 mmol/L (ref 0–100)

## 2010-10-11 LAB — BASIC METABOLIC PANEL
BUN: 16 mg/dL (ref 6–23)
CO2: 29 mEq/L (ref 19–32)
Calcium: 9.3 mg/dL (ref 8.4–10.5)
Chloride: 105 mEq/L (ref 96–112)
Creatinine, Ser: 0.67 mg/dL (ref 0.4–1.2)
GFR calc Af Amer: >60 mL/min (ref >60)
GFR calc non Af Amer: >60 mL/min (ref >60)
Glucose, Bld: 110 mg/dL — ABNORMAL HIGH (ref 70–99)
Potassium: 4.3 mEq/L (ref 3.5–5.1)
Sodium: 138 mEq/L (ref 135–145)

## 2010-10-12 ENCOUNTER — Other Ambulatory Visit: Payer: Self-pay | Admitting: Internal Medicine

## 2010-10-12 MED ORDER — OMEPRAZOLE 20 MG PO CPDR: DELAYED_RELEASE_CAPSULE | ORAL | Status: DC

## 2010-10-26 ENCOUNTER — Other Ambulatory Visit: Payer: Self-pay | Admitting: Surgery

## 2010-10-26 ENCOUNTER — Encounter (HOSPITAL_COMMUNITY): Payer: BC Managed Care – PPO

## 2010-10-26 LAB — DIFFERENTIAL
Basophils Absolute: 0 10*3/uL (ref 0.0–0.1)
Basophils Relative: 0 % (ref 0–1)
Eosinophils Absolute: 0.1 10*3/uL (ref 0.0–0.7)
Eosinophils Relative: 1 % (ref 0–5)
Lymphocytes Relative: 31 % (ref 12–46)
Lymphs Abs: 1.7 10*3/uL (ref 0.7–4.0)
Monocytes Absolute: 0.5 10*3/uL (ref 0.1–1.0)
Monocytes Relative: 9 % (ref 3–12)
Neutro Abs: 3.3 10*3/uL (ref 1.7–7.7)
Neutrophils Relative %: 59 % (ref 43–77)

## 2010-10-26 LAB — URINALYSIS, ROUTINE W REFLEX MICROSCOPIC
Bilirubin Urine: NEGATIVE
Glucose, UA: NEGATIVE mg/dL
Hgb urine dipstick: NEGATIVE
Ketones, ur: NEGATIVE mg/dL
Nitrite: NEGATIVE
Protein, ur: NEGATIVE mg/dL
Specific Gravity, Urine: 1.02 (ref 1.005–1.030)
Urobilinogen, UA: 0.2 mg/dL (ref 0.0–1.0)
pH: 5 (ref 5.0–8.0)

## 2010-10-26 LAB — CBC
HCT: 39.6 % (ref 36.0–46.0)
Hemoglobin: 13.2 g/dL (ref 12.0–15.0)
MCH: 29.8 pg (ref 26.0–34.0)
MCHC: 33.3 g/dL (ref 30.0–36.0)
MCV: 89.4 fL (ref 78.0–100.0)
Platelets: 206 10*3/uL (ref 150–400)
RBC: 4.43 MIL/uL (ref 3.87–5.11)
RDW: 13.1 % (ref 11.5–15.5)
WBC: 5.6 10*3/uL (ref 4.0–10.5)

## 2010-10-26 LAB — PROTIME-INR
INR: 0.98 (ref 0.00–1.49)
Prothrombin Time: 13.2 seconds (ref 11.6–15.2)

## 2010-10-26 LAB — BASIC METABOLIC PANEL
BUN: 16 mg/dL (ref 6–23)
CO2: 29 mEq/L (ref 19–32)
Calcium: 9.1 mg/dL (ref 8.4–10.5)
Chloride: 104 mEq/L (ref 96–112)
Creatinine, Ser: 0.7 mg/dL (ref 0.4–1.2)
GFR calc Af Amer: >60 mL/min (ref >60)
GFR calc non Af Amer: >60 mL/min (ref >60)
Glucose, Bld: 76 mg/dL (ref 70–99)
Potassium: 3.5 mEq/L (ref 3.5–5.1)
Sodium: 140 mEq/L (ref 135–145)

## 2010-10-26 LAB — URINE MICROSCOPIC-ADD ON

## 2010-10-26 LAB — SURGICAL PCR SCREEN
MRSA, PCR: NEGATIVE
Staphylococcus aureus: NEGATIVE

## 2010-10-31 ENCOUNTER — Other Ambulatory Visit: Payer: Self-pay | Admitting: Surgery

## 2010-10-31 ENCOUNTER — Observation Stay (HOSPITAL_COMMUNITY)
Admission: RE | Admit: 2010-10-31 | Discharge: 2010-11-01 | Disposition: A | Discharge status: Home / Self Care | Payer: BC Managed Care – PPO | Source: Ambulatory Visit | Attending: Surgery | Admitting: Surgery

## 2010-10-31 DIAGNOSIS — E039 Hypothyroidism, unspecified: Secondary | ICD-10-CM | POA: Insufficient documentation

## 2010-10-31 DIAGNOSIS — E041 Nontoxic single thyroid nodule: Principal | ICD-10-CM | POA: Insufficient documentation

## 2010-10-31 DIAGNOSIS — Z01811 Encounter for preprocedural respiratory examination: Secondary | ICD-10-CM | POA: Insufficient documentation

## 2010-10-31 DIAGNOSIS — Z01812 Encounter for preprocedural laboratory examination: Secondary | ICD-10-CM | POA: Insufficient documentation

## 2010-10-31 DIAGNOSIS — Z79899 Other long term (current) drug therapy: Secondary | ICD-10-CM | POA: Insufficient documentation

## 2010-10-31 DIAGNOSIS — K219 Gastro-esophageal reflux disease without esophagitis: Secondary | ICD-10-CM | POA: Insufficient documentation

## 2010-10-31 DIAGNOSIS — E063 Autoimmune thyroiditis: Secondary | ICD-10-CM | POA: Insufficient documentation

## 2010-10-31 LAB — CALCIUM: Calcium: 8.8 mg/dL (ref 8.4–10.5)

## 2010-11-01 LAB — CALCIUM: Calcium: 7.8 mg/dL — ABNORMAL LOW (ref 8.4–10.5)

## 2010-11-07 NOTE — Discharge Summary (Signed)
  [name], [name] NO.:  [id number]  MEDICAL RECORD NO.:  [id number]           PATIENT TYPE:  O  LOCATION:  1522                         FACILITY:  [facility]  PHYSICIAN:  Velora Heckler, MD      DATE OF BIRTH:  [birth year]  DATE OF ADMISSION:  10/31/2010 DATE OF DISCHARGE:                              DISCHARGE SUMMARY   REASON FOR ADMISSION:  Thyroid nodules with atypia.  BRIEF HISTORY:  The patient is a 65 year old white female from [place], [place].  She has longstanding history of thyroid nodules.  She has been on full-dose thyroid hormone replacement.  Fine- needle aspiration of a dominant left-sided thyroid nodule in February 2012 showed a hypercellular lesion with follicular epithelial cells and mild cytologic atypia.  There were also calcifications.  The patient now comes to surgery for resection for definitive diagnosis.  HOSPITAL COURSE:  The patient was admitted on October 31, 2010.  She was taken directly to the operating room where she underwent thyroidectomy. Postoperative course was uncomplicated.  The patient's calcium fell to a low level of 7.8.  She received intravenous calcium gluconate prior to discharge.  She was started on oral calcium three times daily.  She will return to see me in the office in 2 to 3 weeks with a serum calcium level drawn prior to her office visit.  CONDITION AT DISCHARGE:  Good.  FINAL DIAGNOSIS:  Thyroid nodules with cytologic atypia, final pathology results pending.     Velora Heckler, MD     TMG/MEDQ  D:  11/01/2010  T:  11/01/2010  Job:  [id number]  cc:   Tonita Cong, M.D. Fax: [phone]  Barbette Hair. [place], DO [address] [place], [place] [address]  Velora Heckler, MD [address] [place] [address]  Electronically Signed by Darnell Level MD on 11/07/2010 11:38:57 AM

## 2010-11-07 NOTE — Op Note (Signed)
NAMEROMONDA, PARKER NO.:  [id number]  MEDICAL RECORD NO.:  [id number]           PATIENT TYPE:  O  LOCATION:  DAYL                         FACILITY:  [facility]  PHYSICIAN:  Velora Heckler, MD      DATE OF BIRTH:  [birth year]  DATE OF PROCEDURE:  10/31/2010                               OPERATIVE REPORT   PREOPERATIVE DIAGNOSIS:  Left thyroid nodule with cytologic atypia.  POSTOPERATIVE DIAGNOSIS:  Left thyroid nodule with cytologic atypia.  PROCEDURE:  Total thyroidectomy.  SURGEON:  Velora Heckler, MD, FACS  ASSISTANT:  Claud Kelp, MD, FACS  ANESTHESIA:  General per Dr. Eilene Ghazi.  ESTIMATED BLOOD LOSS:  Minimal.  PREPARATION:  ChloraPrep.  COMPLICATIONS:  None.  INDICATIONS:  The patient is a 65 year old white female from [place], [place].  She has had a longstanding history of thyroid nodules dating to 37.  She is currently taking generic levothyroxine 125 mcg daily.  The patient has had fine-needle aspiration biopsies of the dominant left thyroid nodule, most recently in February of 2012.  This showed a hypercellular lesion with follicular epithelial cells and mild cytologic atypia.  There are course calcifications.  The patient now comes to Surgery for resection for definitive diagnosis.  BODY OF REPORT:  Procedure was done in OR #11 at the [facility].  The patient was brought to the operating room, placed in supine position on the operating room table.  Following administration of general anesthesia, the patient was positioned and then prepped and draped in usual strict aseptic fashion.  After ascertaining that an adequate level of anesthesia had been achieved, a Kocher incision was made with a #15 blade.  Dissection was carried through subcutaneous tissues and platysma.  Hemostasis was obtained with the electrocautery.  Skin flaps were elevated cephalad and caudad from the thyroid notch to the sternal notch.   A Mahorner self-retaining retractor was placed for exposure.  Strap muscles were incised in the midline and dissection was begun on the left side.  Left thyroid lobe was small, nodular, quite firm, somewhat adherent to the underlying trachea.  Venous tributaries were divided between small Ligaclips. Inferior venous tributaries were divided between small and medium Ligaclips using the harmonic scalpel.  Superior pole was dissected out and superior pole vessels were divided between Ligaclips individually with the harmonic scalpel.  Gland was rolled anteriorly.  The inferior thyroid artery was identified and divided between Ligaclips with the harmonic scalpel.  The parathyroid tissue was identified and preserved. Gland was rolled further anteriorly and the recurrent laryngeal nerve was positively identified and preserved.  Ligament of Allyson Sabal was released with electrocautery and the lobe was mobilized up and on to the anterior trachea.  There was a very long, thin pyramidal lobe which extends up to the level of the thyroid notch.  It was gently dissected out with electrocautery and resected en bloc with the thyroid isthmus.  Dry pack was placed in the left neck.  We turned our attention to the right thyroid lobe which was somewhat larger than the left.  It was gently dissected out.  Strap muscles  were reflected laterally.  Middle thyroid vein was divided between Ligaclips with the harmonic scalpel. Inferior venous tributaries were divided between medium Ligaclips.  The superior pole vessels were dissected out individually and divided between medium Ligaclips with the harmonic scalpel.  Gland was rolled anteriorly.  Parathyroid tissue was identified and preserved.  Branches of the inferior thyroid artery were divided between small Ligaclips with the harmonic scalpel.  Recurrent nerve was identified and preserved. Ligament of Allyson Sabal was released.  Lobe was mobilized up and on to the anterior  trachea from which it was completely excised with the electrocautery.  There was a single relatively firm but normal sized lymph node in the left neck.  This was adjacent to the superior pole of the left thyroid lobe.  It was resected and submitted as a separate specimen.  Neck was irrigated with warm saline.  Surgicel was placed in the operative field.  Strap muscles were reapproximated in the midline with interrupted 3-0 Vicryl sutures.  Platysma was closed with interrupted 3- 0 Vicryl sutures.  Skin was closed with running 4-0 Monocryl subcuticular suture.  Wound was washed and dried, and Benzoin and Steri- Strips were applied.  Sterile dressings were applied.  Specimen was submitted to pathology with a suture marking the left superior pole. The patient was awakened from anesthesia and brought to the recovery room.  The patient tolerated the procedure well.   Velora Heckler, MD, FACS     TMG/MEDQ  D:  10/31/2010  T:  10/31/2010  Job:  [id number]  cc:   Barbette Hair. [place], DO [address] [place], [place] [address]  Tonita Cong, M.D. Fax: [phone]  Electronically Signed by Darnell Level MD on 11/07/2010 11:38:52 AM

## 2010-11-24 ENCOUNTER — Other Ambulatory Visit: Payer: Self-pay | Admitting: Anesthesiology

## 2010-11-24 ENCOUNTER — Ambulatory Visit
Admission: RE | Admit: 2010-11-24 | Discharge: 2010-11-24 | Disposition: A | Discharge status: Home / Self Care | Payer: BC Managed Care – PPO | Source: Ambulatory Visit | Attending: Anesthesiology | Admitting: Anesthesiology

## 2010-11-24 DIAGNOSIS — R52 Pain, unspecified: Secondary | ICD-10-CM

## 2010-11-24 LAB — HM DEXA SCAN

## 2010-11-24 NOTE — Op Note (Signed)
. [facility]  Patient:    Joan [name], Joan [name] Visit Number: [id number] MRN: [id number]          Service Type: SUR Location: [id number] Attending Physician:  Jacki Cones Dictated by:   Veverly Fells Ophelia Charter, M.D. Proc. 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  PROCEDURE:  Left L3-4 and L4-5 foraminotomies, and left L3-4 microdiskectomy.  SURGEON:  Mark C. Ophelia Charter, M.D.  ASSISTANT:  Zonia Kief, P.A.-C.  ANESTHESIA:  GOT.  ESTIMATED BLOOD LOSS:  100 mL.  DESCRIPTION OF PROCEDURE:  After induction of general anesthesia, the patient was placed on the ______ frame in the kneeling position.  Back was prepped with Duraprep, scrubbed with towels, the usual sterile Betadine drape was applied.  Needle localization with spinal needle was performed, and L3-4 disk was between the two spinal needles mid position.  Old incision was opened. Ligaments were released off the spinous process with Bovie electrocautery on the left side.  Subperiosteal dissection onto the lamina, and a Taylor retractor was placed at the L3-4 level.  Undersurface of the lamina on the left at L3 was cleaned of soft tissue.  Lamina was thinned slightly at the inferior aspect with Lexell.  Ligamentum was incised with a scalpel blade, and the deep layer poked through with a #4 Penfield pad used to protect the dura, and the hypertrophic ligamentum was removed.  There was thick ligaments in the lateral gutter, and bone was removed out to the level of the pedicle.  There was considerable facet overgrowth at the cephalad portion of the foramina. Some epidural veins were coagulated with the bipolar cautery with the ______ to  protect the dura.  This was inspected, and there was disk herniation with a firm hard portion of disk that had been extruded on the left side which was not seen with the previous MRI scan.  Disk space would only allow a #4 Penfield to be placed, and it was so tight that a micropituitary would not fit in the disk space.  The firm disk material was removed using large straight pituitary, and 3 and 4 mm Kerrison.  In the mid portion of the firm disk was some fresh herniated disk which was causing the bulging and this was removed. A #4 Epstein was used deliver all the extruded material out of the pocket. Needle was removed until bone was visualized above and below the level of the disk, and all annulus, extruded disk material, and ligament had been removed. Ligamentum was removed progressing cephalad and taking some more bone, leaving a thin 4 or 5 mm layer of lamina on the left side remaining.  Palpation of hockey stick showed no areas of compression.  Bone was removed, flushed with the pedicle.  Foramina was enlarged.  Inspection on all sides of the nerve root showed it was completely free.  Hockey stick could be swept on 180 degree sweep anterior to the dura with no compression and no fragments of disk.  Operating microscope was backed up.  Taylor retractor was placed down at the L4-5 level, and the procedure was repeated.  At this level, the disk was flat.  There was no herniation.  Ligaments were hypertrophic, but not as severe.  No bipolar cautery was necessary.  There were some large epidural veins, and moderate facet overgrowth there in the foramina.  Nerve root was completely decompressed.  Hockey stick could be passed on all sides of the nerve root which was the L4 nerve root.  After irrigation with saline at both levels, the fascia was closed with 0 Vicryl interrupted suture.  Vicryl 2-0 in subcutaneous tissue, 4-0 Vicryl in subcuticular closure.  Benzoin and Steri-Strips.  Marcaine  infiltration of the skin, and postoperative dressing. The patient was returned to the supine position, extubated, and transferred to the recovery room in stable condition.  Instrument count and needle count was correct. Dictated by:   Veverly Fells Ophelia Charter, M.D. Attending Physician:  Jacki Cones DD:  09/19/01 TD:  09/20/01 Job: [id number]

## 2010-11-24 NOTE — Op Note (Signed)
[name], [name]             ACCOUNT NO.:  [id number]   MEDICAL RECORD NO.:  [id number]          PATIENT TYPE:  AMB   LOCATION:  DSC                          FACILITY:  [facility]   PHYSICIAN:  Harvie Junior, M.D.   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   OPERATION PERFORMED:  1.  Partial posterior horn medial meniscectomy with corresponding      debridement of medial femoral condyle.  2.  Debridement of medial shelf plica.  3.  Debridement of chondromalacia patella, specifically the trochlea.  4.  Lateral retinacular release.   SURGEON:  Harvie Junior, M.D.   ASSISTANT:  Marshia Ly, P.A.   ANESTHESIA:  General.   BRIEF HISTORY:  The patient is a 65 year old female with a long history of  having significant left knee pain.  She had undergone previously  arthroscopic examination which had given her some good relief.  She  continued to complain of knee pain recently and because of continuing  complaints of pain without relief with conservative care, she was ultimately  taken to the operating room for left knee arthroscopy.   DESCRIPTION OF PROCEDURE:  The patient was taken to the operating room and  after adequate anesthesia was obtained with general anesthetic, the patient  was placed supine on the operating table.  The left leg was then prepped and  draped in the usual sterile fashion.  Following this, routine arthroscopic  examination of the knee revealed that there was an obvious posterior horn  medial meniscal tear which was debrided with straight biting forceps back to  a smooth and stable rim.  Attention was turned to the medial  femoral condyle  which had some grade 2 and grade 3 changes which were debrided back to  smooth and stable rim.  Attention was turned to the anterior cruciate which  was normal, lateral side, except some minimal chondromalacia which was not  debrided.  Attention was turned up into the patellofemoral compartment which  showed some grade 3 change in the trochlea which was debrided extensively.  The medial compartment was difficult to get because of an extensive medial  shelf plica.  This was debrided back to the patellar wall medially and  attention was turned to the tight lateral retinaculum.  Patellar tracking  was directly lateral prior to lateral retinacular release.  At this point  lateral retinacular release was undertaken with arthroscopic Bovie.  Once  this had been accomplished, patellar tracking was re-evaluated and noted  to be midline.  At this point the knee was copiously irrigated and suctioned  dry.  The arthroscopic portals were closed with bandage.  Sterile  compressive dressing was applied and the patient was taken to recovery where  she was noted to be in satisfactory condition.  The estimated blood loss  for  this procedure was none.      JLG/MEDQ  D:  11/03/2004  T:  11/04/2004  Job:  [id number]

## 2010-11-24 NOTE — Op Note (Signed)
[name], [name] NO.:  [id number]   MEDICAL RECORD NO.:  [id number]          PATIENT TYPE:  AMB   LOCATION:  ENDO                         FACILITY:  [facility]   PHYSICIAN:  Danise Edge, M.D.   DATE OF BIRTH:  10-06-1945   DATE OF PROCEDURE:  08/28/2004  DATE OF DISCHARGE:                                 OPERATIVE REPORT   PROCEDURE:  Esophagogastroduodenoscopy and colonoscopy with polypectomy.   INDICATIONS:  This Joan [name] is a 65 year old female born 09-22-1945.  Joan [name] has had heartburn for years. She denies dysphagia, odynophagia,  vomiting or gastrointestinal bleeding. Her current morning dose of Protonix  prevents heartburn through the day evening. She occasionally has  breakthrough regurgitation. She is scheduled to undergo a screening  colonoscopy with polypectomy to prevent colon cancer. I will also perform an  esophagogastroduodenoscopy to rule out Barrett's esophagus.   MEDICATION ALLERGIES:  None.   CHRONIC MEDICATIONS:  Protonix, Synthroid, Premarin, Fluoxetine, Avinza,  carisoprodol, __________, Alfonso Patten.   PAST MEDICAL HISTORY:  Four back operations, multinodular goiter, left knee  surgery, cesarean section, hysterectomy, left arm surgery, left ankle  surgery.   HABITS:  Mr. Brossman has been a two  pack per day cigarette smoker for 25  years. She does not consume alcohol.   FAMILY HISTORY:  Father died age 25 of pancreatic cancer. Mother died at age  76; she has hypertension and diabetes. Joan [name] has two sisters and two  brothers   ENDOSCOPIST:  Danise Edge, M.D.   PREMEDICATION:  Versed 12 mg, Demerol 100 mg.   PROCEDURE:  DIAGNOSTIC ESOPHAGOGASTRODUODENOSCOPY:  After obtaining informed  consent, Joan [name] was placed in the left lateral decubitus position. I  administered intravenous Demerol and intravenous Versed to achieve conscious  sedation for the procedure. The patient's blood pressure, oxygen saturation  and  cardiac rhythm were monitored throughout the procedure documented in the  medical record.   The Olympus gastroscope was passed through the posterior hypopharynx and the  proximal esophagus without difficulty. I did not visualize the vocal cords.   ESOPHAGOSCOPY:  The proximal mid and lower segments of the esophageal mucosa  appeared normal. The squamocolumnar junction and esophagogastric junction  are noted at 40 cm from the incisor teeth. There is no endoscopic evidence  for the presence of erosive esophagitis or Barrett's esophagus.   GASTROSCOPY:  Retroflex view of the gastric cardia and fundus was normal.  The gastric body, antrum and pylorus appeared normal.   DUODENOSCOPY:  The duodenal bulb and descending duodenum appear normal.   ASSESSMENT:  Normal esophagogastroduodenoscopy.   PROCEDURE:  PROCTOCOLONOSCOPY:  Anal inspection and digital rectal exam were  normal. The Olympus adjustable pediatric colonoscope was introduced into the  rectum and advanced to the cecum. Colonic preparation for the exam today was  excellent.   RECTUM:  Normal.  SIGMOID COLON AND DESCENDING COLON:  At approximately 55 cm from the anal  verge, two 2 mm polyps were removed with the electrocautery snare.  SPLENIC FLEXURE:  Normal.  TRANSVERSE COLON: Normal.  HEPATIC FLEXURE:  Normal.  ASCENDING COLON:  Normal.  CECUM AND ILEOCECAL VALVE:  Normal.   ASSESSMENT:  Two small polyps were removed from the left colon at 55 cm from  the anal verge; otherwise normal screening proctocolonoscopy to the cecum.      MJ/MEDQ  D:  08/28/2004  T:  08/28/2004  Job:  [id number]   cc:   Annabell Howells, MD  Fax: [phone]

## 2010-11-24 NOTE — Op Note (Signed)
. [facility]  Patient:    Joan [name], Joan [name]                    MRN: [id number] Proc. Date: 10/03/00 Adm. Date:  [id number] Disc. Date: [id number] Attending:  Jacki Cones                           Operative Report  PREOPERATIVE DIAGNOSIS:  Right L3 herniated nucleus pulposus.  POSTOPERATIVE DIAGNOSIS:  Right L3 herniated nucleus pulposus.  OPERATION PERFORMED:  Right L3-4 microdiskectomy.  SURGEON:  Mark C. Ophelia Charter, M.D.  ASSISTANT:  Colon Flattery. Ollen Bowl, M.D.  ANESTHESIA:  GOT  ESTIMATED BLOOD LOSS:  300 ml.  DESCRIPTION OF PROCEDURE:  After induction of general endotracheal anesthesia, the patient was placed on the Andrews frame in standard positioning with prepping and draping, preoperative Ancef prophylaxis.  Needle localization with the needle at the L3-4 level on the left was placed, confirmed with a cross table lateral x-ray and a small midline incision was made. Subperiosteal dissection was performed and a Taylor retractor placed laterally for exposure at L3-4.  A small laminotomy was made at L3 on the right.  The operating microscope was prepped and draped and brought in and the ligamentum flavum was excised with a scalpel.  Patty used to protect the dura and ligamentum was removed with a Kerrison rongeur.  Lateral facet overhang was removed taking ____________ out to the level of the pedicle exposing the nerve root and the nerve root was gently retracted revealing the disk with central compression at L3-4.  Spurs were removed around the foramina and the disk was exposed using the 15 scalpel blade making an x through the posterior longitudinal ligament and disk material was removed from underneath the ligament that was causing a central compression.  Large chunks of disk were removed until the disk was flat.  A hockey stick could be swept anterior to the dura showing no areas of remaining compression.  A large fragment was removed in  a small pocket which was compressing the L4 nerve centrally and L3 on the right.  The nerve root was carefully inspected.  It was free in the axilla and on the shoulder and the foramina was clear.  Numerous passes were made with up, down and straight pituitaries as well as micropituitaries.  The disk space was irrigated with saline solution as well as the operative field. The operative microscope was removed.  The fascia was closed with 0 Vicryl, 2-0 Vicryl in the subcutaneous tissues and skin staple closure.  Instrument count and count was correct.  Dressing was applied.  The patient was transferred to the recovery room after extubation in stable condition with improvement in her quad weakness.  Right leg pain was completely resolved in the recovery room. DD:  11/10/00 TD:  11/11/00 Job: [id number]

## 2010-12-08 ENCOUNTER — Encounter: Payer: Self-pay | Admitting: Family

## 2010-12-11 ENCOUNTER — Other Ambulatory Visit: Payer: Self-pay | Admitting: Internal Medicine

## 2010-12-11 LAB — BASIC METABOLIC PANEL
BUN: 10 mg/dL (ref 6–23)
CO2: 28 mEq/L (ref 19–32)
Calcium: 9.1 mg/dL (ref 8.4–10.5)
Chloride: 103 mEq/L (ref 96–112)
Creat: 0.83 mg/dL (ref 0.50–1.10)
Glucose, Bld: 102 mg/dL — ABNORMAL HIGH (ref 70–99)
Potassium: 4.5 mEq/L (ref 3.5–5.3)
Sodium: 140 mEq/L (ref 135–145)

## 2010-12-11 LAB — LIPID PANEL
Cholesterol: 168 mg/dL (ref 0–200)
HDL: 71 mg/dL (ref >39)
LDL Cholesterol: 76 mg/dL (ref 0–99)
Total CHOL/HDL Ratio: 2.4 Ratio
Triglycerides: 104 mg/dL (ref <150)
VLDL: 21 mg/dL (ref 0–40)

## 2010-12-11 LAB — HEPATIC FUNCTION PANEL
ALT: 10 U/L (ref 0–35)
AST: 16 U/L (ref 0–37)
Albumin: 4.4 g/dL (ref 3.5–5.2)
Alkaline Phosphatase: 80 U/L (ref 39–117)
Bilirubin, Direct: 0.1 mg/dL (ref 0.0–0.3)
Indirect Bilirubin: 0.3 mg/dL (ref 0.0–0.9)
Total Bilirubin: 0.4 mg/dL (ref 0.3–1.2)
Total Protein: 6.5 g/dL (ref 6.0–8.3)

## 2010-12-11 LAB — TSH: TSH: 0.652 u[IU]/mL (ref 0.350–4.500)

## 2010-12-13 ENCOUNTER — Telehealth: Payer: Self-pay | Admitting: *Deleted

## 2010-12-13 NOTE — Telephone Encounter (Signed)
Patient called and left voice message stating she is still having some sever fatigue and body aches and would like to know if she could have a T3 and T4 drawn, to see if she is having problems with her thyroid.

## 2010-12-14 NOTE — Telephone Encounter (Signed)
T3 total and T4 are fine to get (I see she had a low-normal TSH on 12/11/10). I also see that Dr. Artist Pais referred her to endocrinologist 07/2010 for a thyroid nodule.  Did she ever go see an endocrinologist?

## 2010-12-15 NOTE — Telephone Encounter (Signed)
Patient states that she has followed up with Dr Sharl Ma and she has placed a call to their office. She stated she was informed that he was out this week, and they will contact her on  Monday to have the additional testing done. No further action is required

## 2010-12-19 ENCOUNTER — Encounter: Payer: Self-pay | Admitting: Family Medicine

## 2010-12-19 ENCOUNTER — Ambulatory Visit (INDEPENDENT_AMBULATORY_CARE_PROVIDER_SITE_OTHER): Payer: BC Managed Care – PPO | Admitting: Family Medicine

## 2010-12-19 ENCOUNTER — Ambulatory Visit: Payer: Self-pay | Admitting: Internal Medicine

## 2010-12-19 DIAGNOSIS — E039 Hypothyroidism, unspecified: Secondary | ICD-10-CM

## 2010-12-19 NOTE — Patient Instructions (Signed)
We'll call you with results of your bone density test that was done recently.

## 2010-12-20 NOTE — Progress Notes (Signed)
OFFICE NOTE  12/20/2010  CC:  Chief Complaint  Patient presents with  . Hypothyroidism    s/p thyroidectomy 10/31/2010  . Hypertension     HPI: Patient is a 65 y.o. Caucasian female who is here for f/u hypothyroidism. Feeling tired, mild achiness diffusely even since having thyroidectomy 10/2010 and getting on appropriate thyroid replacement by her endocrinologist, Dr. Sharl Ma.  Her TSH this month was:  Lab Results  Component Value Date   TSH 0.652 12/11/2010   Pertinent PMH:  HTN Hyperlipidemia Hypothyroidism DJD  GERD  Pertinent Meds: Synthroid qd, HCTZ 25mg  qd (outside med reconciliation), simvastatin 20mg  qd, prilosec 20mg  qd, norco, soma. PE: Blood pressure 108/60, pulse 73, temperature 98 F (36.7 C), temperature source Oral, resp. rate 18, height 5' 5.5" (1.664 m), weight 122 lb (55.339 kg), SpO2 98.00%. Gen: Alert, well appearing.  Patient is oriented to person, place, time, and situation. No further exam today.  IMPRESSION AND PLAN: HYPOTHYROIDISM Still with hypothyroidism symptoms despite low-normal TSH this month. Dr. Sharl Ma, her endocrinologist, is checking her T3 and T4 levels.  He'll adjust synthroid or add T3 as appropriate.   If thyroid adjustment not appropriate or helpful, consider statin induced myalgias and short term d/c of statin. Continue all other management as-is.   FOLLOW UP: 67mo.

## 2010-12-20 NOTE — Assessment & Plan Note (Signed)
Still with hypothyroidism symptoms despite low-normal TSH this month. Dr. Sharl Ma, her endocrinologist, is checking her T3 and T4 levels.  He'll adjust synthroid or add T3 as appropriate.

## 2010-12-21 ENCOUNTER — Encounter: Payer: Self-pay | Admitting: Family Medicine

## 2010-12-22 ENCOUNTER — Telehealth: Payer: Self-pay | Admitting: Family

## 2010-12-22 MED ORDER — CALCIUM CARBONATE-VITAMIN D 600-400 MG-UNIT PO TABS: 1.0000 | ORAL_TABLET | Freq: Two times a day (BID) | ORAL | Status: DC

## 2010-12-22 NOTE — Telephone Encounter (Signed)
Pt.notified

## 2010-12-22 NOTE — Telephone Encounter (Signed)
Bone density shows mild bone thinning.  She should add caltrate+ D 600mg   twice daily to help her bones.

## 2010-12-24 ENCOUNTER — Encounter: Payer: Self-pay | Admitting: Family

## 2010-12-27 ENCOUNTER — Encounter (INDEPENDENT_AMBULATORY_CARE_PROVIDER_SITE_OTHER): Payer: Self-pay | Admitting: Surgery

## 2010-12-27 DIAGNOSIS — Z803 Family history of malignant neoplasm of breast: Secondary | ICD-10-CM | POA: Insufficient documentation

## 2010-12-27 DIAGNOSIS — R61 Generalized hyperhidrosis: Secondary | ICD-10-CM | POA: Insufficient documentation

## 2010-12-27 DIAGNOSIS — Z460 Encounter for fitting and adjustment of spectacles and contact lenses: Secondary | ICD-10-CM

## 2010-12-27 DIAGNOSIS — Z780 Asymptomatic menopausal state: Secondary | ICD-10-CM

## 2011-01-14 ENCOUNTER — Other Ambulatory Visit: Payer: Self-pay | Admitting: Internal Medicine

## 2011-01-22 ENCOUNTER — Other Ambulatory Visit: Payer: Self-pay | Admitting: Internal Medicine

## 2011-02-09 ENCOUNTER — Other Ambulatory Visit: Payer: Self-pay | Admitting: Internal Medicine

## 2011-02-14 ENCOUNTER — Other Ambulatory Visit: Payer: Self-pay | Admitting: Anesthesiology

## 2011-02-14 DIAGNOSIS — R202 Paresthesia of skin: Secondary | ICD-10-CM

## 2011-02-16 ENCOUNTER — Ambulatory Visit
Admission: RE | Admit: 2011-02-16 | Discharge: 2011-02-16 | Disposition: A | Discharge status: Home / Self Care | Payer: Medicare Other | Source: Ambulatory Visit | Attending: Anesthesiology | Admitting: Anesthesiology

## 2011-02-16 DIAGNOSIS — R202 Paresthesia of skin: Secondary | ICD-10-CM

## 2011-02-26 ENCOUNTER — Other Ambulatory Visit: Payer: Self-pay | Admitting: Obstetrics and Gynecology

## 2011-02-26 DIAGNOSIS — Z1231 Encounter for screening mammogram for malignant neoplasm of breast: Secondary | ICD-10-CM

## 2011-02-28 ENCOUNTER — Other Ambulatory Visit: Payer: Self-pay | Admitting: Orthopedic Surgery

## 2011-03-03 ENCOUNTER — Other Ambulatory Visit: Payer: Self-pay | Admitting: Internal Medicine

## 2011-03-15 ENCOUNTER — Ambulatory Visit: Payer: Medicare Other

## 2011-03-22 ENCOUNTER — Ambulatory Visit
Admission: RE | Admit: 2011-03-22 | Discharge: 2011-03-22 | Disposition: A | Discharge status: Home / Self Care | Payer: Medicare Other | Source: Ambulatory Visit | Attending: Obstetrics and Gynecology | Admitting: Obstetrics and Gynecology

## 2011-03-22 DIAGNOSIS — Z1231 Encounter for screening mammogram for malignant neoplasm of breast: Secondary | ICD-10-CM

## 2011-03-23 ENCOUNTER — Telehealth: Payer: Self-pay | Admitting: *Deleted

## 2011-03-23 NOTE — Telephone Encounter (Signed)
Pt would like to go back on Temazepam, and needs to pick up a written prescription from Dr. Artist Pais today.

## 2011-03-23 NOTE — Telephone Encounter (Signed)
Pt states you gave it to her a while ago, and she had not used it for a while.  Is having multiple stress in her life, and would like to start back.

## 2011-03-23 NOTE — Telephone Encounter (Signed)
I do not see temazepam on her current or hx med list.  Did we ever prescribe?

## 2011-03-23 NOTE — Telephone Encounter (Signed)
Please call pharm to confirm we prescribed for her.  If yes, we can refill x 1

## 2011-03-28 NOTE — Telephone Encounter (Signed)
Pt is calling back to inquire whether Dr. Artist Pais is going to write the prescription for Temazepam.  Needs a written prescription.

## 2011-03-29 MED ORDER — TEMAZEPAM 30 MG PO CAPS: 30.0000 mg | ORAL_CAPSULE | Freq: Every evening | ORAL | Status: DC | PRN

## 2011-03-29 NOTE — Telephone Encounter (Signed)
rx is for Temazepam 30 mg 1 po qhs per [facility].  Pt wants a written a rx because she has new insurance and needs to shop around to get a good a price.  Printed rx for Dr Artist Pais to sign and its up front ready for p/u, pt aware

## 2011-04-11 ENCOUNTER — Ambulatory Visit (INDEPENDENT_AMBULATORY_CARE_PROVIDER_SITE_OTHER): Payer: Medicare Other | Admitting: Internal Medicine

## 2011-04-11 ENCOUNTER — Encounter: Payer: Self-pay | Admitting: Internal Medicine

## 2011-04-11 DIAGNOSIS — IMO0001 Reserved for inherently not codable concepts without codable children: Secondary | ICD-10-CM

## 2011-04-11 DIAGNOSIS — I10 Essential (primary) hypertension: Secondary | ICD-10-CM

## 2011-04-11 DIAGNOSIS — M791 Myalgia, unspecified site: Secondary | ICD-10-CM | POA: Insufficient documentation

## 2011-04-11 DIAGNOSIS — K219 Gastro-esophageal reflux disease without esophagitis: Secondary | ICD-10-CM

## 2011-04-11 DIAGNOSIS — E041 Nontoxic single thyroid nodule: Secondary | ICD-10-CM

## 2011-04-11 DIAGNOSIS — G4700 Insomnia, unspecified: Secondary | ICD-10-CM

## 2011-04-11 MED ORDER — TEMAZEPAM 30 MG PO CAPS: 30.0000 mg | ORAL_CAPSULE | Freq: Every evening | ORAL | Status: DC | PRN

## 2011-04-11 MED ORDER — OMEPRAZOLE 40 MG PO CPDR: 40.0000 mg | DELAYED_RELEASE_CAPSULE | Freq: Every day | ORAL | Status: DC

## 2011-04-11 MED ORDER — HYDROCHLOROTHIAZIDE 25 MG PO TABS: 25.0000 mg | ORAL_TABLET | Freq: Every day | ORAL | Status: DC

## 2011-04-11 NOTE — Assessment & Plan Note (Signed)
Well controlled on PPI.  Consider transition to ranitidine at next office visit

## 2011-04-11 NOTE — Assessment & Plan Note (Signed)
Stable. Continue temazepam as needed

## 2011-04-11 NOTE — Assessment & Plan Note (Addendum)
S/P left thyroidectomy.  Management as per Endo (Dr. Sharl Ma) Final pathology showed:  FINAL DIAGNOSIS    1. Thyroid, thyroidectomy, :   - ADENOMATOID NODULE WITH HURTHLE CELL CHANGES.   - HASHIMOTO'S THYROIDITIS   - SEE COMMENT    2. Lymph node, biopsy, left parathyroidal :   - BENIGN THYROIDAL TISSUE WITH HASHIMOTO'S THYROIDITIS.   - NO LYMPH NODAL TISSUE IDENTIFIED

## 2011-04-11 NOTE — Assessment & Plan Note (Signed)
65 year old female with myalgias of unclear etiology. Her symptoms isolated to upper extremities. This may be related to her cervical radicular symptoms. I doubt her symptoms related to change in her thyroid medication. although I did advise patient to discuss changing to [place]our Thyroid with her endocrinologist. This may improve her overall symptoms of fatigue.  I doubt her symptoms related to her use of simvastatin. Check CPK. Patient to try not using simvastatin x2 weeks to see if her symptoms improve.

## 2011-04-11 NOTE — Progress Notes (Signed)
Subjective:    Patient ID: Joan [name], female    DOB: Sep 12, 1945, 65 y.o.   MRN: [id number]  HPI  65 y/o white female with hx of thyroid nodule, hypothyroidism and insomnia for follow up.   In since previous visit,  patient has had 2 major surgeries. He's had left thyroidectomy for thyroid nodules and is followed by endocrinologist Dr. Sharl Ma. Although her TSH is normal patient reports feeling tired and more achy than usual.  She is unclear whether this is related to her thyroid medication.  Pt also had right foot surgery in August.  Patient is also experiencing significant symptoms of cervical radiculopathy. She is followed by a neurosurgeon and he is planning decompression surgery.  Review of Systems Negative for fever, negative for lower ext pain  Past Medical History  Diagnosis Date  . Chronic low back pain     followed by Dr Vear Clock pain mgt  . Hypertension   . Hyperlipemia   . Osteoarthritis of left knee     advanced  . Tobacco abuse   . Hypothyroidism   . History of cardiac arrhythmia     cardiologist- Traci Turner  . Esophageal stricture   . Sleep apnea   . Thyroid nodule   . Colon polyp     History   Social History  . Marital Status: Divorced    Spouse Name: N/A    Number of Children: N/A  . Years of Education: N/A   Occupational History  . Not on file.   Social History Main Topics  . Smoking status: Current Everyday Smoker -- 1.0 packs/day  . Smokeless tobacco: Not on file   Comment: 1 ppd - 20 years  . Alcohol Use: No  . Drug Use: Not on file  . Sexually Active: Not on file   Other Topics Concern  . Not on file   Social History Narrative   DivorcedCurrent Smoker  1 ppd -  20 yrs   Alcohol use-no    [employer] - laid off     Past Surgical History  Procedure Date  . Other surgical history     4 Lumbar surgeries  . Lumbar fusion     and rods  . Cesarean section   . Ankle surgery     left ankle 1995  . Infusion pump  implantation     history of implantablet morphine pump  . Partial hysterectomy   . Knee surgery 1991  . Elbow surgery 1990's    Family History  Problem Relation Age of Onset  . Pancreatic cancer Father     deceased age 59  . Cancer Father   . Diabetes type II Mother     deceased age 56  . Hypertension Mother   . Coronary artery disease Mother   . Diabetes Mother   . Cancer Mother     Thyroid and Skin  . Diabetes Sister   . Hypertension Sister   . Cancer Sister     Breast  . Other Neg Hx     No family history of  colon cancer  . Hypertension Brother     Allergies  Allergen Reactions  . Gabapentin     REACTION: Itching Ask patient to clarify "Neurogan" on history form dated 09/25/10.  Marland Kitchen Hydrochlorothiazide W/Triamterene     REACTION: rash and itching    Current Outpatient Prescriptions on File Prior to Visit  Medication Sig Dispense Refill  . BISACODYL PO Take 5 mg by mouth daily.       Marland Kitchen  carisoprodol (SOMA) 350 MG tablet Take 350 mg by mouth every 6 (six) hours as needed.        Marland Kitchen HYDROcodone-acetaminophen (NORCO) 10-325 MG per tablet Take 1 tablet by mouth 5 (five) times daily.       Marland Kitchen levothyroxine (SYNTHROID, LEVOTHROID) 125 MCG tablet TAKE 1 TABLET DAILY  90 tablet  1  . potassium chloride SA (KLOR-CON M20) 20 MEQ tablet Take 20 mEq by mouth daily.        . simvastatin (ZOCOR) 20 MG tablet TAKE 1 TABLET DAILY  90 tablet  0    BP 124/84  Pulse 80  Temp(Src) 98.3 F (36.8 C) (Oral)  Ht 5\' 1"  (1.549 m)  Wt 132 lb (59.875 kg)  BMI 24.94 kg/m2       Objective:   Physical Exam   Constitutional: Appears well-developed and well-nourished. No distress.  Neck: Normal range of motion. Neck supple. No thyromegaly present. No carotid bruit Cardiovascular: Normal rate, regular rhythm and normal heart sounds.  Exam reveals no gallop and no friction rub.  No murmur heard. Pulmonary/Chest: Effort normal and breath sounds normal.  No wheezes. No rales.  Abdominal:  Soft. Bowel sounds are normal. No mass. There is no tenderness.  Neurological: Alert. No cranial nerve deficit.  Skin: Skin is warm and dry.  Psychiatric: Normal mood and affect. Behavior is normal.  Muscles - no proximal muscle tenderness     Assessment & Plan:

## 2011-04-16 ENCOUNTER — Ambulatory Visit: Payer: Medicare Other | Admitting: Internal Medicine

## 2011-04-17 ENCOUNTER — Ambulatory Visit: Payer: BC Managed Care – PPO | Admitting: Internal Medicine

## 2011-04-17 ENCOUNTER — Encounter (HOSPITAL_COMMUNITY)
Admission: RE | Admit: 2011-04-17 | Discharge: 2011-04-17 | Disposition: A | Discharge status: Home / Self Care | Payer: Medicare Other | Source: Ambulatory Visit | Attending: Neurosurgery | Admitting: Neurosurgery

## 2011-04-17 ENCOUNTER — Other Ambulatory Visit (HOSPITAL_COMMUNITY): Payer: Self-pay | Admitting: Neurosurgery

## 2011-04-17 DIAGNOSIS — M47812 Spondylosis without myelopathy or radiculopathy, cervical region: Secondary | ICD-10-CM

## 2011-04-17 LAB — BASIC METABOLIC PANEL
BUN: 17 mg/dL (ref 6–23)
CO2: 30 mEq/L (ref 19–32)
Calcium: 9 mg/dL (ref 8.4–10.5)
Chloride: 101 mEq/L (ref 96–112)
Creatinine, Ser: 0.75 mg/dL (ref 0.50–1.10)
GFR calc Af Amer: >90 mL/min (ref >90)
GFR calc non Af Amer: 86 mL/min — ABNORMAL LOW (ref >90)
Glucose, Bld: 100 mg/dL — ABNORMAL HIGH (ref 70–99)
Potassium: 3.6 mEq/L (ref 3.5–5.1)
Sodium: 139 mEq/L (ref 135–145)

## 2011-04-17 LAB — CBC
HCT: 37.5 % (ref 36.0–46.0)
Hemoglobin: 12.8 g/dL (ref 12.0–15.0)
MCH: 30 pg (ref 26.0–34.0)
MCHC: 34.1 g/dL (ref 30.0–36.0)
MCV: 87.8 fL (ref 78.0–100.0)
Platelets: 224 10*3/uL (ref 150–400)
RBC: 4.27 MIL/uL (ref 3.87–5.11)
RDW: 13 % (ref 11.5–15.5)
WBC: 4.9 10*3/uL (ref 4.0–10.5)

## 2011-04-17 LAB — SURGICAL PCR SCREEN
MRSA, PCR: NEGATIVE
Staphylococcus aureus: POSITIVE — AB

## 2011-04-18 ENCOUNTER — Other Ambulatory Visit (HOSPITAL_COMMUNITY): Payer: Medicare Other

## 2011-04-27 ENCOUNTER — Inpatient Hospital Stay (HOSPITAL_COMMUNITY): Payer: Medicare Other

## 2011-04-27 ENCOUNTER — Inpatient Hospital Stay (HOSPITAL_COMMUNITY)
Admission: RE | Admit: 2011-04-27 | Discharge: 2011-04-28 | DRG: 473 | Disposition: A | Discharge status: Home / Self Care | Payer: Medicare Other | Source: Ambulatory Visit | Attending: Neurosurgery | Admitting: Neurosurgery

## 2011-04-27 DIAGNOSIS — F17200 Nicotine dependence, unspecified, uncomplicated: Secondary | ICD-10-CM | POA: Diagnosis present

## 2011-04-27 DIAGNOSIS — J449 Chronic obstructive pulmonary disease, unspecified: Secondary | ICD-10-CM | POA: Diagnosis present

## 2011-04-27 DIAGNOSIS — E039 Hypothyroidism, unspecified: Secondary | ICD-10-CM | POA: Diagnosis present

## 2011-04-27 DIAGNOSIS — Z01812 Encounter for preprocedural laboratory examination: Secondary | ICD-10-CM

## 2011-04-27 DIAGNOSIS — M4712 Other spondylosis with myelopathy, cervical region: Principal | ICD-10-CM | POA: Diagnosis present

## 2011-04-27 DIAGNOSIS — J4489 Other specified chronic obstructive pulmonary disease: Secondary | ICD-10-CM | POA: Diagnosis present

## 2011-04-27 DIAGNOSIS — Z01818 Encounter for other preprocedural examination: Secondary | ICD-10-CM

## 2011-04-30 NOTE — Op Note (Signed)
[name], PATIN NO.:  [id number]  MEDICAL RECORD NO.:  [id number]  LOCATION:  3534                         FACILITY:  [facility]  PHYSICIAN:  Joan [name], M.D.        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  PROCEDURE:  Anterior cervical diskectomy and fusion at C5-C6, C6-C7, and C7-T1 using Globus PEEK cages packed with local autograft mixed with Actifuse and an Atlantis translational plate with 8 x 30 mm fixed angle screws.  SURGEON:  Joan Citrin, MD  ASSISTANT:  Yetta Barre  ANESTHESIA:  General endotracheal.  HISTORY OF PRESENT ILLNESS:  The patient is a very pleasant 65 year old female who has had progressive worsening neck pain, bilateral arm pain worse on the right with numbness, tingling on her hands and weakness in her hands.  MRI scan showed severe cervical stenosis with subluxations at C5-C6, C6-C7, C7-T1 with severe central canal stenosis as well as foraminal stenosis.  The patient's physical exam consistent with myelopathy, MRI findings, and failure of conservative treatment.  The patient is recommended anterior cervical diskectomies and fusion.  I went over the risks and benefits of the operation with her.  She understands and agrees to proceed forward.  The patient was brought to the OR, was sedated under general anesthesia. The left side of her neck was prepped and draped in usual sterile fashion.  A transverse incision was drawn out and incised just off the midline to the anterior border of the sternocleidomastoid.  The superficial layer of the platysma was dissected out and divided longitudinally.  The avascular plane between the sternocleidomastoid and strap muscle was developed down to the prevertebral fascia. Prevertebral fascia was dissected with  Kittners.  Intraoperative x-ray identified the C5-C6 disk space levels.  The longus coli was reflected laterally and self-retaining retractors were placed and annulotomies were made to mark that disk space as well as the two below that.  Then after the retractor in place, both disk spaces were incised; large anterior osteophyte with Leksell rongeur and 2 and 3 mm Kerrison punch. Then using a high-speed drill and capturing the bone shavings in the mucus trap, the disk space was drilled down at all 3 levels.  Then, first working at C7-T1, aggressive underbiting of both endplates was carried out with a 1-mm Kerrison punch.  What was noted here was marked stenosis predominantly from the subluxation, but displacement of the T1 vertebral body towards the spinal cord.  So, this endplate was aggressively underbitten, decompressing the central canal and marched across laterally to the level of the C8 pedicle and the C8 nerve root was unroofed, flushed the pedicle easily accepting a nerve hook as the foramen.  This was then packed with Gelfoam.  Attention was taken to C5- C6.  At C5-C6, there was severe stenosis and marked facet hypertrophy predominantly on the left side and this was all aggressively underbitten.  There was a large spur displacing the right C6 nerve root. This was also aggressively removed.  Both neural foramina  were opened up and aggressive underbiting of both endplates carried out the central decompression.  After adequate foraminal decompression was achieved as well as centrally, this was also packed with Gelfoam.  Then, attention was taken to C6-C7.  At C6-C7, __________ stenosis was right at midline and after the dura was identified, left in midline with piecemeal revision of PLL, aggressive underbiting of both endplates carried out decompressing the central canal.  Both C7 nerve roots were skeletonized flush with pedicle, and after adequate decompression and teasing confirmed  here, all endplates were scraped with a vacurette.  The 8 mm PEEK cages were inserted at C6-C7 and C5-C6, all 2 mm deep to the anterior vertebral line and a 9 mm at C7-T1.  Then after all implants had been confirmed to be in good position, wound was copiously irrigated and meticulous hemostasis was maintained.  A 55 mm Atlantis translational plate was placed, all screws had excellent purchase. Locking mechanisms were engaged.  Then, postop fluoroscopy confirmed good position of the implants and plate.  Then a drain was placed and after meticulous hemostasis was maintained, the wound was closed in layers with interrupted Vicryl in the platysma, running 4-0 subcuticular.  Benzoin and Steri-Strips were applied.  The patient went to recovery room in stable condition.  At the end of the case, all needle count and sponge count was correct.          ______________________________ Joan [name], M.D.     GC/MEDQ  D:  04/27/2011  T:  04/27/2011  Job:  [id number]  Electronically Signed by Joan [name] M.D. on 04/30/2011 03:21:26 PM

## 2011-05-19 ENCOUNTER — Other Ambulatory Visit: Payer: Self-pay | Admitting: Internal Medicine

## 2011-05-21 ENCOUNTER — Other Ambulatory Visit: Payer: Self-pay | Admitting: *Deleted

## 2011-05-21 MED ORDER — SIMVASTATIN 20 MG PO TABS: 20.0000 mg | ORAL_TABLET | Freq: Every day | ORAL | Status: DC

## 2011-05-24 ENCOUNTER — Ambulatory Visit
Admission: RE | Admit: 2011-05-24 | Discharge: 2011-05-24 | Disposition: A | Discharge status: Home / Self Care | Payer: Medicare Other | Source: Ambulatory Visit | Attending: Neurosurgery | Admitting: Neurosurgery

## 2011-05-24 ENCOUNTER — Other Ambulatory Visit: Payer: Self-pay | Admitting: Neurosurgery

## 2011-05-24 DIAGNOSIS — M542 Cervicalgia: Secondary | ICD-10-CM

## 2011-05-28 ENCOUNTER — Ambulatory Visit
Admission: RE | Admit: 2011-05-28 | Discharge: 2011-05-28 | Disposition: A | Discharge status: Home / Self Care | Payer: Medicare Other | Source: Ambulatory Visit | Attending: Neurosurgery | Admitting: Neurosurgery

## 2011-05-28 ENCOUNTER — Other Ambulatory Visit: Payer: Self-pay | Admitting: Neurosurgery

## 2011-05-28 DIAGNOSIS — M25512 Pain in left shoulder: Secondary | ICD-10-CM

## 2011-06-05 ENCOUNTER — Other Ambulatory Visit: Payer: Self-pay | Admitting: Dermatology

## 2011-07-01 ENCOUNTER — Other Ambulatory Visit: Payer: Self-pay | Admitting: Internal Medicine

## 2011-08-13 ENCOUNTER — Telehealth: Payer: Self-pay | Admitting: Internal Medicine

## 2011-08-13 MED ORDER — SIMVASTATIN 20 MG PO TABS: 20.0000 mg | ORAL_TABLET | Freq: Every day | ORAL | Status: DC

## 2011-08-13 NOTE — Telephone Encounter (Signed)
Pt has changed mail order pharmacies. Pt now uses [facility]. Pt is req to get a script for Simvastin 20 mg 90 day supply to [facility] order [facility]. Fax # [phone].    Local pharmacy also needs to be changed to [facility] on [address].

## 2011-08-13 NOTE — Telephone Encounter (Signed)
rx sent in electronically 

## 2011-08-21 ENCOUNTER — Other Ambulatory Visit: Payer: Self-pay | Admitting: Neurosurgery

## 2011-08-21 ENCOUNTER — Ambulatory Visit
Admission: RE | Admit: 2011-08-21 | Discharge: 2011-08-21 | Disposition: A | Discharge status: Home / Self Care | Payer: Medicare Other | Source: Ambulatory Visit | Attending: Neurosurgery | Admitting: Neurosurgery

## 2011-08-21 DIAGNOSIS — M542 Cervicalgia: Secondary | ICD-10-CM

## 2011-09-07 HISTORY — PX: SHOULDER ARTHROSCOPY: SHX128

## 2011-10-01 ENCOUNTER — Ambulatory Visit: Payer: Medicare Other | Attending: Orthopedic Surgery | Admitting: Physical Therapy

## 2011-10-01 DIAGNOSIS — M25519 Pain in unspecified shoulder: Secondary | ICD-10-CM | POA: Insufficient documentation

## 2011-10-01 DIAGNOSIS — M25619 Stiffness of unspecified shoulder, not elsewhere classified: Secondary | ICD-10-CM | POA: Insufficient documentation

## 2011-10-01 DIAGNOSIS — IMO0001 Reserved for inherently not codable concepts without codable children: Secondary | ICD-10-CM | POA: Insufficient documentation

## 2011-10-04 ENCOUNTER — Ambulatory Visit: Payer: Medicare Other | Admitting: Physical Therapy

## 2011-10-09 ENCOUNTER — Ambulatory Visit: Payer: Medicare Other | Attending: Orthopedic Surgery | Admitting: Physical Therapy

## 2011-10-09 DIAGNOSIS — IMO0001 Reserved for inherently not codable concepts without codable children: Secondary | ICD-10-CM | POA: Insufficient documentation

## 2011-10-09 DIAGNOSIS — M25519 Pain in unspecified shoulder: Secondary | ICD-10-CM | POA: Insufficient documentation

## 2011-10-09 DIAGNOSIS — M25619 Stiffness of unspecified shoulder, not elsewhere classified: Secondary | ICD-10-CM | POA: Insufficient documentation

## 2011-10-10 ENCOUNTER — Encounter: Payer: Self-pay | Admitting: Internal Medicine

## 2011-10-10 ENCOUNTER — Ambulatory Visit (INDEPENDENT_AMBULATORY_CARE_PROVIDER_SITE_OTHER): Payer: Medicare Other | Admitting: Internal Medicine

## 2011-10-10 VITALS — BP 112/70 | HR 76 | Temp 98.4°F | Ht 61.0 in | Wt 130.0 lb

## 2011-10-10 DIAGNOSIS — E039 Hypothyroidism, unspecified: Secondary | ICD-10-CM

## 2011-10-10 DIAGNOSIS — G4700 Insomnia, unspecified: Secondary | ICD-10-CM

## 2011-10-10 DIAGNOSIS — F17200 Nicotine dependence, unspecified, uncomplicated: Secondary | ICD-10-CM

## 2011-10-10 DIAGNOSIS — K219 Gastro-esophageal reflux disease without esophagitis: Secondary | ICD-10-CM

## 2011-10-10 MED ORDER — CLONAZEPAM 1 MG PO TABS: 1.0000 mg | ORAL_TABLET | Freq: Every evening | ORAL | Status: DC | PRN

## 2011-10-10 MED ORDER — OMEPRAZOLE 20 MG PO CPDR: 20.0000 mg | DELAYED_RELEASE_CAPSULE | Freq: Two times a day (BID) | ORAL | Status: DC

## 2011-10-10 NOTE — Patient Instructions (Signed)
Please complete the following lab tests before your next follow up appointment: BMET - 401.9 TSH - 244.9 

## 2011-10-10 NOTE — Assessment & Plan Note (Signed)
Patient requests that we follow her thyroid levels and adjust her medication.  TSH before next OV.

## 2011-10-10 NOTE — Assessment & Plan Note (Signed)
Patient advised to use combination of nicotine transdermal patches and nicotine lozenges.

## 2011-10-10 NOTE — Progress Notes (Signed)
Subjective:    Patient ID: Joan [name], female    DOB: [birth year], 67 y.o.   MRN: [id number]  HPI  66 year old white female with history of thyroid nodule, hypothyroidism for routine followup. Since previous visit patient has had cervical decompression surgery and left shoulder surgery. She is struggling with physical therapy for left shoulder. Patient is somewhat frustrated with persistent fatigue symptoms. She is followed by her endocrinologist who has actually lowered her thyroid dosage since her partial thyroidectomy. We do not have copies of her previous TSH values.  Patient also complains of chronic insomnia. She has tried multiple medications in the past. There is no efficacy with zolpidem. Lunesta caused hallucinations. She is currently taking 30 mg of temazepam with no benefit.  She is still smoking 1 PPD.  She is interested in smoking cessation.  Review of Systems  Negative for shortness of breath.   She tried to switch to otc omeprazole but experienced exacerbation of GERD symptoms.  Past Medical History  Diagnosis Date  . Chronic low back pain     followed by Dr Vear Clock pain mgt  . Hypertension   . Hyperlipemia   . Osteoarthritis of left knee     advanced  . Tobacco abuse   . Hypothyroidism   . History of cardiac arrhythmia     cardiologist- Traci Turner  . Esophageal stricture   . Sleep apnea   . Thyroid nodule   . Colon polyp     History   Social History  . Marital Status: Divorced    Spouse Name: N/A    Number of Children: N/A  . Years of Education: N/A   Occupational History  . Not on file.   Social History Main Topics  . Smoking status: Current Everyday Smoker -- 1.0 packs/day  . Smokeless tobacco: Not on file   Comment: 1 ppd - 20 years  . Alcohol Use: No  . Drug Use: Not on file  . Sexually Active: Not on file   Other Topics Concern  . Not on file   Social History Narrative   DivorcedCurrent Smoker  1 ppd -  20 yrs   Alcohol use-no     [employer] - laid off     Past Surgical History  Procedure Date  . Other surgical history     4 Lumbar surgeries  . Lumbar fusion     and rods  . Cesarean section   . Ankle surgery     left ankle 1995  . Infusion pump implantation     history of implantablet morphine pump  . Partial hysterectomy   . Knee surgery 1991  . Elbow surgery 1990's    Family History  Problem Relation Age of Onset  . Pancreatic cancer Father     deceased age 20  . Cancer Father   . Diabetes type II Mother     deceased age 63  . Hypertension Mother   . Coronary artery disease Mother   . Diabetes Mother   . Cancer Mother     Thyroid and Skin  . Diabetes Sister   . Hypertension Sister   . Cancer Sister     Breast  . Other Neg Hx     No family history of  colon cancer  . Hypertension Brother     Allergies  Allergen Reactions  . Gabapentin     REACTION: Itching Ask patient to clarify "Neurogan" on history form dated 09/25/10.  Marland Kitchen Hydrochlorothiazide W/Triamterene  REACTION: rash and itching    Current Outpatient Prescriptions on File Prior to Visit  Medication Sig Dispense Refill  . BISACODYL PO Take 5 mg by mouth daily.       . carisoprodol (SOMA) 350 MG tablet Take 350 mg by mouth every 6 (six) hours as needed.        Marland Kitchen estradiol (ESTRACE) 0.5 MG tablet Take 1 tablet by mouth daily.      . hydrochlorothiazide (HYDRODIURIL) 25 MG tablet Take 1 tablet (25 mg total) by mouth daily.  90 tablet  1  . HYDROcodone-acetaminophen (NORCO) 10-325 MG per tablet Take 1 tablet by mouth 5 (five) times daily.       . potassium chloride SA (KLOR-CON M20) 20 MEQ tablet Take 20 mEq by mouth daily.        . simvastatin (ZOCOR) 20 MG tablet Take 1 tablet (20 mg total) by mouth at bedtime.  90 tablet  1  . DISCONTD: omeprazole (PRILOSEC) 20 MG capsule TAKE 2 CAPSULES ONCE DAILY  180 capsule  0  . clonazePAM (KLONOPIN) 1 MG tablet Take 1 tablet (1 mg total) by mouth at bedtime as needed.  30  tablet  2  . temazepam (RESTORIL) 30 MG capsule Take 1 capsule (30 mg total) by mouth at bedtime as needed for sleep.  30 capsule  5  . DISCONTD: levothyroxine (SYNTHROID, LEVOTHROID) 125 MCG tablet TAKE 1 TABLET DAILY  90 tablet  1  . DISCONTD: omeprazole (PRILOSEC) 40 MG capsule Take 1 capsule (40 mg total) by mouth daily.  90 capsule  1    BP 112/70  Pulse 76  Temp(Src) 98.4 F (36.9 C) (Oral)  Ht 5\' 1"  (1.549 m)  Wt 130 lb (58.968 kg)  BMI 24.56 kg/m2       Objective:   Physical Exam  Constitutional: She is oriented to person, place, and time. She appears well-developed and well-nourished.  HENT:  Head: Normocephalic and atraumatic.  Neck: Normal range of motion. Neck supple.  Cardiovascular: Normal rate, regular rhythm and normal heart sounds.   Pulmonary/Chest: Effort normal and breath sounds normal. She has no wheezes. She has no rales.  Abdominal: Soft. Bowel sounds are normal.  Musculoskeletal: She exhibits no edema.  Neurological: She is alert and oriented to person, place, and time.  Skin: Skin is warm and dry.  Psychiatric: She has a normal mood and affect. Her behavior is normal.          Assessment & Plan:

## 2011-10-10 NOTE — Assessment & Plan Note (Signed)
History of chronic insomnia. She tried Ambien but discontinued due to poor efficacy. Lunesta caused hallucinations. Her current dose of temazepam is insufficient. I suggest she switch to clonazepam 1 mg at bedtime as needed.

## 2011-10-10 NOTE — Assessment & Plan Note (Signed)
Persistent GERD symptoms with OTC Prilosec. Continue omeprazole 20 mg twice a day.

## 2011-10-11 ENCOUNTER — Ambulatory Visit: Payer: Medicare Other | Admitting: Physical Therapy

## 2011-10-15 ENCOUNTER — Ambulatory Visit: Payer: Medicare Other | Admitting: Physical Therapy

## 2011-10-17 ENCOUNTER — Encounter: Payer: Medicare Other | Admitting: Physical Therapy

## 2011-10-18 ENCOUNTER — Ambulatory Visit: Payer: Medicare Other | Admitting: Physical Therapy

## 2011-10-23 ENCOUNTER — Ambulatory Visit: Payer: Medicare Other | Admitting: Physical Therapy

## 2011-10-25 ENCOUNTER — Ambulatory Visit: Payer: Medicare Other | Admitting: Physical Therapy

## 2011-10-29 ENCOUNTER — Ambulatory Visit: Payer: Medicare Other | Admitting: Physical Therapy

## 2011-11-01 ENCOUNTER — Ambulatory Visit: Payer: Medicare Other | Admitting: Physical Therapy

## 2011-11-08 ENCOUNTER — Ambulatory Visit: Payer: Medicare Other | Attending: Orthopedic Surgery | Admitting: Physical Therapy

## 2011-11-08 DIAGNOSIS — M25519 Pain in unspecified shoulder: Secondary | ICD-10-CM | POA: Insufficient documentation

## 2011-11-08 DIAGNOSIS — M25619 Stiffness of unspecified shoulder, not elsewhere classified: Secondary | ICD-10-CM | POA: Insufficient documentation

## 2011-11-08 DIAGNOSIS — IMO0001 Reserved for inherently not codable concepts without codable children: Secondary | ICD-10-CM | POA: Insufficient documentation

## 2011-11-12 ENCOUNTER — Encounter: Payer: Medicare Other | Admitting: Physical Therapy

## 2011-11-14 ENCOUNTER — Encounter: Payer: Medicare Other | Admitting: Physical Therapy

## 2011-11-15 ENCOUNTER — Ambulatory Visit: Payer: Medicare Other | Admitting: Physical Therapy

## 2011-11-21 ENCOUNTER — Ambulatory Visit: Payer: Medicare Other | Admitting: Physical Therapy

## 2011-11-22 ENCOUNTER — Ambulatory Visit: Payer: Medicare Other | Admitting: Physical Therapy

## 2011-11-23 ENCOUNTER — Other Ambulatory Visit: Payer: Self-pay | Admitting: Neurosurgery

## 2011-11-23 DIAGNOSIS — M545 Low back pain: Secondary | ICD-10-CM

## 2011-11-28 ENCOUNTER — Ambulatory Visit: Payer: Medicare Other | Admitting: Physical Therapy

## 2011-11-29 ENCOUNTER — Ambulatory Visit
Admission: RE | Admit: 2011-11-29 | Discharge: 2011-11-29 | Disposition: A | Discharge status: Home / Self Care | Payer: Medicare Other | Source: Ambulatory Visit | Attending: Neurosurgery | Admitting: Neurosurgery

## 2011-11-29 DIAGNOSIS — M545 Low back pain: Secondary | ICD-10-CM

## 2011-12-19 ENCOUNTER — Other Ambulatory Visit: Payer: Self-pay | Admitting: Neurosurgery

## 2011-12-19 DIAGNOSIS — M545 Low back pain: Secondary | ICD-10-CM

## 2011-12-21 ENCOUNTER — Ambulatory Visit
Admission: RE | Admit: 2011-12-21 | Discharge: 2011-12-21 | Disposition: A | Discharge status: Home / Self Care | Payer: Medicare Other | Source: Ambulatory Visit | Attending: Neurosurgery | Admitting: Neurosurgery

## 2011-12-21 DIAGNOSIS — M545 Low back pain: Secondary | ICD-10-CM

## 2011-12-25 ENCOUNTER — Other Ambulatory Visit (INDEPENDENT_AMBULATORY_CARE_PROVIDER_SITE_OTHER): Payer: Medicare Other

## 2011-12-25 DIAGNOSIS — E785 Hyperlipidemia, unspecified: Secondary | ICD-10-CM

## 2011-12-25 LAB — TSH: TSH: 0.94 u[IU]/mL (ref 0.35–5.50)

## 2011-12-25 LAB — BASIC METABOLIC PANEL
BUN: 14 mg/dL (ref 6–23)
CO2: 30 mEq/L (ref 19–32)
Calcium: 8.3 mg/dL — ABNORMAL LOW (ref 8.4–10.5)
Chloride: 101 mEq/L (ref 96–112)
Creatinine, Ser: 0.7 mg/dL (ref 0.4–1.2)
GFR: 90.16 mL/min (ref >60.00)
Glucose, Bld: 87 mg/dL (ref 70–99)
Potassium: 3.3 mEq/L — ABNORMAL LOW (ref 3.5–5.1)
Sodium: 139 mEq/L (ref 135–145)

## 2011-12-27 ENCOUNTER — Other Ambulatory Visit: Payer: Self-pay | Admitting: *Deleted

## 2011-12-27 MED ORDER — POTASSIUM CHLORIDE CRYS ER 20 MEQ PO TBCR: 20.0000 meq | EXTENDED_RELEASE_TABLET | Freq: Two times a day (BID) | ORAL | Status: DC

## 2011-12-31 ENCOUNTER — Telehealth: Payer: Self-pay | Admitting: Family Medicine

## 2011-12-31 NOTE — Telephone Encounter (Signed)
[facility] Triage Call Report Triage Record Num: [id number] Operator: Baldomero Lamy Patient Name: Joan [name] Call Date & Time: 12/29/2011 12:49:48PM Patient Phone: [phone] PCP: Thomos Lemons Patient Gender: Female PCP Fax : [phone] Patient DOB: 1946-01-10 [facility] Name: [facility] Reason for Call: Caller: [name]/Patient; PCP: Thomos Lemons; CB#: [phone]; Call regarding Medication Issue; Medication(s): Synthroid generic; Pt calling stating she forgot her Levothyroxine 100 mcg. Pt is in [place] right now and needs # 4 called in to [facility]-[phone]. Called in per Epic instruction to [place], [facility]-# 4. Protocol(s) Used: Medication Questions - Adult Recommended Outcome per Protocol: Call Dispensing Pharmacy or Provider Immediately Reason for Outcome: Unable to obtain prescribed medication related to available resources AND situation poses immediate clinical risk Care Advice: ~ Have pharmacy phone number and prescription information available when you speak with provider. 06/

## 2012-01-01 ENCOUNTER — Other Ambulatory Visit: Payer: Medicare Other

## 2012-01-08 ENCOUNTER — Encounter (HOSPITAL_COMMUNITY): Payer: Self-pay | Admitting: Pharmacy Technician

## 2012-01-09 ENCOUNTER — Ambulatory Visit (INDEPENDENT_AMBULATORY_CARE_PROVIDER_SITE_OTHER): Payer: Medicare Other | Admitting: Internal Medicine

## 2012-01-09 VITALS — BP 112/72 | Temp 98.7°F | Wt 130.0 lb

## 2012-01-09 DIAGNOSIS — G4700 Insomnia, unspecified: Secondary | ICD-10-CM

## 2012-01-09 DIAGNOSIS — T148XXA Other injury of unspecified body region, initial encounter: Secondary | ICD-10-CM

## 2012-01-09 DIAGNOSIS — E039 Hypothyroidism, unspecified: Secondary | ICD-10-CM

## 2012-01-09 DIAGNOSIS — IMO0002 Reserved for concepts with insufficient information to code with codable children: Secondary | ICD-10-CM | POA: Insufficient documentation

## 2012-01-09 DIAGNOSIS — E876 Hypokalemia: Secondary | ICD-10-CM

## 2012-01-09 MED ORDER — HYDROCHLOROTHIAZIDE 25 MG PO TABS: 25.0000 mg | ORAL_TABLET | Freq: Every day | ORAL | Status: DC

## 2012-01-09 MED ORDER — LEVOTHYROXINE SODIUM 100 MCG PO TABS: 100.0000 ug | ORAL_TABLET | Freq: Every day | ORAL | Status: DC

## 2012-01-09 MED ORDER — CLONAZEPAM 1 MG PO TABS: 1.0000 mg | ORAL_TABLET | Freq: Every evening | ORAL | Status: DC | PRN

## 2012-01-09 MED ORDER — SIMVASTATIN 20 MG PO TABS: 20.0000 mg | ORAL_TABLET | Freq: Every day | ORAL | Status: DC

## 2012-01-09 NOTE — Patient Instructions (Addendum)
Stop taking fish oil supplement 5 days before your surgery Call our office if your finger gets painful and red. TSH - 244.9

## 2012-01-09 NOTE — Progress Notes (Signed)
Subjective:    Patient ID: Joan [name], female    DOB: [birth year], 66 y.o.   MRN: [id number]  HPI  66 year old white female with hypertension and hypothyroidism for routine followup. The patient's blood pressure stable. She had mild hypokalemia. She is taking her potassium supplementation only once daily. After recent blood tests increased to twice daily.  She reports cutting her left index finger on Thursday night while cleaning a blender blade. There was some profuse bleeding initially but that has since resolved. There is mild tenderness. She denies any redness or fever. She does not recall her last shot.  Chronic insomnia-improved with 1 mg of clonazepam.   Review of Systems Negative for chest pain or shortness of breath  Past Medical History  Diagnosis Date  . Chronic low back pain     followed by Dr Vear Clock pain mgt  . Hypertension   . Hyperlipemia   . Osteoarthritis of left knee     advanced  . Tobacco abuse   . Hypothyroidism   . History of cardiac arrhythmia     cardiologist- Traci Turner  . Esophageal stricture   . Sleep apnea   . Thyroid nodule   . Colon polyp     History   Social History  . Marital Status: Divorced    Spouse Name: N/A    Number of Children: N/A  . Years of Education: N/A   Occupational History  . Not on file.   Social History Main Topics  . Smoking status: Current Everyday Smoker -- 1.0 packs/day  . Smokeless tobacco: Not on file   Comment: 1 ppd - 20 years  . Alcohol Use: No  . Drug Use: Not on file  . Sexually Active: Not on file   Other Topics Concern  . Not on file   Social History Narrative   DivorcedCurrent Smoker  1 ppd -  20 yrs   Alcohol use-no    [employer] - laid off     Past Surgical History  Procedure Date  . Other surgical history     4 Lumbar surgeries  . Lumbar fusion     and rods  . Cesarean section   . Ankle surgery     left ankle 1995  . Infusion pump implantation     history  of implantablet morphine pump  . Partial hysterectomy   . Knee surgery 1991  . Elbow surgery 1990's    Family History  Problem Relation Age of Onset  . Pancreatic cancer Father     deceased age 79  . Cancer Father   . Diabetes type II Mother     deceased age 34  . Hypertension Mother   . Coronary artery disease Mother   . Diabetes Mother   . Cancer Mother     Thyroid and Skin  . Diabetes Sister   . Hypertension Sister   . Cancer Sister     Breast  . Other Neg Hx     No family history of  colon cancer  . Hypertension Brother     Allergies  Allergen Reactions  . Gabapentin     REACTION: Itching Ask patient to clarify "Neurogan" on history form dated 09/25/10.  Marland Kitchen Hydrochlorothiazide W-Triamterene     REACTION: rash and itching    Current Outpatient Prescriptions on File Prior to Visit  Medication Sig Dispense Refill  . bisacodyl (DULCOLAX) 5 MG EC tablet Take 5 mg by mouth at bedtime.      Marland Kitchen  carisoprodol (SOMA) 350 MG tablet Take 350 mg by mouth every 6 (six) hours as needed. For muscle spasms      . clonazePAM (KLONOPIN) 1 MG tablet Take 1 tablet (1 mg total) by mouth at bedtime as needed.  30 tablet  2  . estradiol (ESTRACE) 0.5 MG tablet Take 1 tablet by mouth daily.      Marland Kitchen GLUCOSAMINE PO Take 2 tablets by mouth daily.      Marland Kitchen HYDROcodone-acetaminophen (NORCO) 10-325 MG per tablet Take 1 tablet by mouth every 4 (four) hours.       . nicotine (NICODERM CQ - DOSED IN MG/24 HOURS) 14 mg/24hr patch Place 1 patch onto the skin daily.      . Omega-3 Fatty Acids (FISH OIL) 1200 MG CAPS Take 2,400 mg by mouth daily.      Marland Kitchen omeprazole (PRILOSEC) 20 MG capsule Take 1 capsule (20 mg total) by mouth 2 (two) times daily.  180 capsule  1  . OVER THE COUNTER MEDICATION Take 3,000 mcg by mouth daily. Vitamin b-12 3000 mcg      . potassium chloride SA (KLOR-CON M20) 20 MEQ tablet Take 1 tablet (20 mEq total) by mouth 2 (two) times daily.  180 tablet  2  . DISCONTD: hydrochlorothiazide  (HYDRODIURIL) 25 MG tablet Take 1 tablet (25 mg total) by mouth daily.  90 tablet  1  . DISCONTD: levothyroxine (SYNTHROID, LEVOTHROID) 100 MCG tablet Take 100 mcg by mouth daily.      Marland Kitchen DISCONTD: simvastatin (ZOCOR) 20 MG tablet Take 1 tablet (20 mg total) by mouth at bedtime.  90 tablet  1  . DISCONTD: clonazePAM (KLONOPIN) 1 MG tablet Take 1 mg by mouth at bedtime.        BP 112/72  Temp 98.7 F (37.1 C) (Oral)  Wt 130 lb (58.968 kg)       Objective:   Physical Exam  Constitutional: She is oriented to person, place, and time. She appears well-developed and well-nourished.  Cardiovascular: Normal rate, regular rhythm and normal heart sounds.   No murmur heard. Pulmonary/Chest: Effort normal and breath sounds normal. She has no wheezes.  Musculoskeletal: She exhibits no edema.  Neurological: She is alert and oriented to person, place, and time.  Skin:       Shallow 1 cm laceration left index finger          Assessment & Plan:

## 2012-01-09 NOTE — Assessment & Plan Note (Signed)
Improved with clonazepam.

## 2012-01-09 NOTE — Assessment & Plan Note (Signed)
Patient with mild laceration of left index finger. No sign of secondary infection. Continue to use antibiotic ointment and change dressing twice a day for next one week. Patient updated with tetanus vaccine.  Patient advised to call office if symptoms persist or worsen.

## 2012-01-09 NOTE — Assessment & Plan Note (Signed)
She is euthyroid. No change in thyroid replacement medication dose.

## 2012-01-15 ENCOUNTER — Encounter (HOSPITAL_COMMUNITY)
Admission: RE | Admit: 2012-01-15 | Discharge: 2012-01-15 | Disposition: A | Discharge status: Home / Self Care | Payer: Medicare Other | Source: Ambulatory Visit | Attending: Neurosurgery | Admitting: Neurosurgery

## 2012-01-15 ENCOUNTER — Other Ambulatory Visit: Payer: Self-pay | Admitting: Neurosurgery

## 2012-01-15 ENCOUNTER — Encounter (HOSPITAL_COMMUNITY): Payer: Self-pay

## 2012-01-15 HISTORY — DX: Gastro-esophageal reflux disease without esophagitis: K21.9

## 2012-01-15 LAB — CBC
HCT: 37 % (ref 36.0–46.0)
Hemoglobin: 12.7 g/dL (ref 12.0–15.0)
MCH: 30.7 pg (ref 26.0–34.0)
MCHC: 34.3 g/dL (ref 30.0–36.0)
MCV: 89.4 fL (ref 78.0–100.0)
Platelets: 203 10*3/uL (ref 150–400)
RBC: 4.14 MIL/uL (ref 3.87–5.11)
RDW: 13.6 % (ref 11.5–15.5)
WBC: 4.9 10*3/uL (ref 4.0–10.5)

## 2012-01-15 LAB — BASIC METABOLIC PANEL
BUN: 8 mg/dL (ref 6–23)
CO2: 28 mEq/L (ref 19–32)
Calcium: 8.6 mg/dL (ref 8.4–10.5)
Chloride: 102 mEq/L (ref 96–112)
Creatinine, Ser: 0.67 mg/dL (ref 0.50–1.10)
GFR calc Af Amer: >90 mL/min (ref >90)
GFR calc non Af Amer: 89 mL/min — ABNORMAL LOW (ref >90)
Glucose, Bld: 96 mg/dL (ref 70–99)
Potassium: 4 mEq/L (ref 3.5–5.1)
Sodium: 139 mEq/L (ref 135–145)

## 2012-01-15 LAB — SURGICAL PCR SCREEN
MRSA, PCR: NEGATIVE
Staphylococcus aureus: NEGATIVE

## 2012-01-15 LAB — TYPE AND SCREEN
ABO/RH(D): A POS
Antibody Screen: NEGATIVE

## 2012-01-15 LAB — ABO/RH: ABO/RH(D): A POS

## 2012-01-15 NOTE — Pre-Procedure Instructions (Signed)
20 Joan [name]  01/15/2012   Your procedure is scheduled on:  Wednesday 01/23/12   Report to [facility] at 630 AM.  Call this number if you have problems the morning of surgery: [phone]   Remember:   Do not eat food OR DRINK :After Midnight.   Take these medicines the morning of surgery with A SIP OF WATER: ESTRACE,  HYDROCODONE IF NEEDED, SYNTHROID, PRILOSEC, POTASSIUM   Do not wear jewelry, make-up or nail polish.  Do not wear lotions, powders, or perfumes. You may wear deodorant.  Do not shave 48 hours prior to surgery. Men may shave face and neck.  Do not bring valuables to the hospital.  Contacts, dentures or bridgework may not be worn into surgery.  Leave suitcase in the car. After surgery it may be brought to your room.  For patients admitted to the hospital, checkout time is 11:00 AM the day of discharge.   Patients discharged the day of surgery will not be allowed to drive home.  Name and phone number of your driver:   Special Instructions: CHG Shower Use Special Wash: 1/2 bottle night before surgery and 1/2 bottle morning of surgery.   Please read over the following fact sheets that you were given: Pain Booklet, Coughing and Deep Breathing, Blood Transfusion Information, MRSA Information and Surgical Site Infection Prevention

## 2012-01-15 NOTE — Progress Notes (Signed)
Patient's PCR screen positive for staph and neg. For MRSA. Patient aware of results and to use medication mupirocin twice a day for 5 days. States will get filled early am to get the medication in twice tomorrow 01/16/12.

## 2012-01-22 ENCOUNTER — Other Ambulatory Visit: Payer: Self-pay | Admitting: Neurosurgery

## 2012-01-22 MED ORDER — CEFAZOLIN SODIUM-DEXTROSE 2-3 GM-% IV SOLR
2.0000 g | INTRAVENOUS | Status: AC
  Administered 2012-01-23 (×2): 2 g via INTRAVENOUS
  Filled 2012-01-22: qty 50

## 2012-01-23 ENCOUNTER — Inpatient Hospital Stay (HOSPITAL_COMMUNITY)
Admission: RE | Admit: 2012-01-23 | Discharge: 2012-01-29 | DRG: 458 | Disposition: A | Discharge status: Home / Self Care | Payer: Medicare Other | Source: Ambulatory Visit | Attending: Neurosurgery | Admitting: Neurosurgery

## 2012-01-23 ENCOUNTER — Encounter (HOSPITAL_COMMUNITY): Payer: Self-pay | Admitting: Anesthesiology

## 2012-01-23 ENCOUNTER — Encounter (HOSPITAL_COMMUNITY): Payer: Self-pay | Admitting: *Deleted

## 2012-01-23 ENCOUNTER — Encounter (HOSPITAL_COMMUNITY)
Admission: RE | Disposition: A | Discharge status: Home / Self Care | Payer: Self-pay | Source: Ambulatory Visit | Attending: Neurosurgery

## 2012-01-23 ENCOUNTER — Ambulatory Visit (HOSPITAL_COMMUNITY): Payer: Medicare Other | Admitting: Anesthesiology

## 2012-01-23 ENCOUNTER — Ambulatory Visit (HOSPITAL_COMMUNITY): Payer: Medicare Other

## 2012-01-23 DIAGNOSIS — F17200 Nicotine dependence, unspecified, uncomplicated: Secondary | ICD-10-CM | POA: Diagnosis present

## 2012-01-23 DIAGNOSIS — E785 Hyperlipidemia, unspecified: Secondary | ICD-10-CM | POA: Diagnosis present

## 2012-01-23 DIAGNOSIS — IMO0002 Reserved for concepts with insufficient information to code with codable children: Principal | ICD-10-CM | POA: Diagnosis present

## 2012-01-23 DIAGNOSIS — M4150 Other secondary scoliosis, site unspecified: Secondary | ICD-10-CM | POA: Diagnosis present

## 2012-01-23 DIAGNOSIS — K219 Gastro-esophageal reflux disease without esophagitis: Secondary | ICD-10-CM | POA: Diagnosis present

## 2012-01-23 DIAGNOSIS — E039 Hypothyroidism, unspecified: Secondary | ICD-10-CM | POA: Diagnosis present

## 2012-01-23 DIAGNOSIS — M1710 Unilateral primary osteoarthritis, unspecified knee: Secondary | ICD-10-CM | POA: Diagnosis present

## 2012-01-23 DIAGNOSIS — I10 Essential (primary) hypertension: Secondary | ICD-10-CM | POA: Diagnosis present

## 2012-01-23 LAB — BASIC METABOLIC PANEL
BUN: 8 mg/dL (ref 6–23)
CO2: 24 mEq/L (ref 19–32)
Calcium: 7.4 mg/dL — ABNORMAL LOW (ref 8.4–10.5)
Chloride: 105 mEq/L (ref 96–112)
Creatinine, Ser: 0.52 mg/dL (ref 0.50–1.10)
GFR calc Af Amer: >90 mL/min (ref >90)
GFR calc non Af Amer: >90 mL/min (ref >90)
Glucose, Bld: 166 mg/dL — ABNORMAL HIGH (ref 70–99)
Potassium: 3.2 mEq/L — ABNORMAL LOW (ref 3.5–5.1)
Sodium: 139 mEq/L (ref 135–145)

## 2012-01-23 LAB — CBC
HCT: 31.1 % — ABNORMAL LOW (ref 36.0–46.0)
Hemoglobin: 10.5 g/dL — ABNORMAL LOW (ref 12.0–15.0)
MCH: 29.8 pg (ref 26.0–34.0)
MCHC: 33.8 g/dL (ref 30.0–36.0)
MCV: 88.4 fL (ref 78.0–100.0)
Platelets: 141 10*3/uL — ABNORMAL LOW (ref 150–400)
RBC: 3.52 MIL/uL — ABNORMAL LOW (ref 3.87–5.11)
RDW: 13.4 % (ref 11.5–15.5)
WBC: 11.4 10*3/uL — ABNORMAL HIGH (ref 4.0–10.5)

## 2012-01-23 SURGERY — POSTERIOR LUMBAR FUSION 2 LEVEL
Anesthesia: General | Site: Spine Lumbar | Wound class: Clean

## 2012-01-23 MED ORDER — DIPHENHYDRAMINE HCL 50 MG/ML IJ SOLN: 12.5000 mg | Freq: Four times a day (QID) | INTRAMUSCULAR | Status: DC | PRN

## 2012-01-23 MED ORDER — HYDROMORPHONE 0.3 MG/ML IV SOLN
INTRAVENOUS | Status: DC
  Administered 2012-01-23: 0.3 mg via INTRAVENOUS
  Administered 2012-01-24: 2.32 mg via INTRAVENOUS
  Administered 2012-01-24: 2.4 mg via INTRAVENOUS
  Administered 2012-01-24: 0.3 mg via INTRAVENOUS
  Administered 2012-01-24: 3 mg via INTRAVENOUS
  Filled 2012-01-23 (×2): qty 25

## 2012-01-23 MED ORDER — DIPHENHYDRAMINE HCL 12.5 MG/5ML PO ELIX
12.5000 mg | ORAL_SOLUTION | Freq: Four times a day (QID) | ORAL | Status: DC | PRN
  Filled 2012-01-23: qty 5

## 2012-01-23 MED ORDER — HYDROMORPHONE HCL PF 1 MG/ML IJ SOLN
INTRAMUSCULAR | Status: AC
  Filled 2012-01-23: qty 1

## 2012-01-23 MED ORDER — BISACODYL 5 MG PO TBEC
5.0000 mg | DELAYED_RELEASE_TABLET | Freq: Every day | ORAL | Status: DC
  Administered 2012-01-24 – 2012-01-28 (×4): 5 mg via ORAL
  Filled 2012-01-23 (×7): qty 1

## 2012-01-23 MED ORDER — THROMBIN 20000 UNITS EX KIT
PACK | CUTANEOUS | Status: DC | PRN
  Administered 2012-01-23: 10:00:00 via TOPICAL

## 2012-01-23 MED ORDER — LIDOCAINE-EPINEPHRINE 1 %-1:100000 IJ SOLN
INTRAMUSCULAR | Status: DC | PRN
  Administered 2012-01-23: 10 mL

## 2012-01-23 MED ORDER — BACITRACIN 50000 UNITS IM SOLR
INTRAMUSCULAR | Status: AC
  Filled 2012-01-23: qty 1

## 2012-01-23 MED ORDER — BUPIVACAINE HCL (PF) 0.25 % IJ SOLN
INTRAMUSCULAR | Status: DC | PRN
  Administered 2012-01-23: 20 mL

## 2012-01-23 MED ORDER — HYDROMORPHONE HCL PF 1 MG/ML IJ SOLN
0.2500 mg | INTRAMUSCULAR | Status: DC | PRN
  Administered 2012-01-23 (×4): 0.5 mg via INTRAVENOUS

## 2012-01-23 MED ORDER — LACTATED RINGERS IV SOLN
INTRAVENOUS | Status: DC | PRN
  Administered 2012-01-23 (×4): via INTRAVENOUS

## 2012-01-23 MED ORDER — ALBUMIN HUMAN 5 % IV SOLN
INTRAVENOUS | Status: DC | PRN
  Administered 2012-01-23 (×2): via INTRAVENOUS

## 2012-01-23 MED ORDER — SODIUM CHLORIDE 0.9 % IJ SOLN: 3.0000 mL | INTRAMUSCULAR | Status: DC | PRN

## 2012-01-23 MED ORDER — NALOXONE HCL 0.4 MG/ML IJ SOLN: 0.4000 mg | INTRAMUSCULAR | Status: DC | PRN

## 2012-01-23 MED ORDER — PANTOPRAZOLE SODIUM 40 MG PO TBEC
40.0000 mg | DELAYED_RELEASE_TABLET | Freq: Every day | ORAL | Status: DC
  Administered 2012-01-24 – 2012-01-29 (×6): 40 mg via ORAL
  Filled 2012-01-23 (×5): qty 1

## 2012-01-23 MED ORDER — OXYCODONE-ACETAMINOPHEN 5-325 MG PO TABS
1.0000 | ORAL_TABLET | ORAL | Status: DC | PRN
  Administered 2012-01-23 – 2012-01-24 (×2): 2 via ORAL
  Filled 2012-01-23 (×3): qty 2

## 2012-01-23 MED ORDER — FENTANYL CITRATE 0.05 MG/ML IJ SOLN
INTRAMUSCULAR | Status: DC | PRN
  Administered 2012-01-23 (×4): 50 ug via INTRAVENOUS
  Administered 2012-01-23: 100 ug via INTRAVENOUS
  Administered 2012-01-23: 50 ug via INTRAVENOUS
  Administered 2012-01-23: 100 ug via INTRAVENOUS
  Administered 2012-01-23 (×3): 50 ug via INTRAVENOUS
  Administered 2012-01-23: 100 ug via INTRAVENOUS

## 2012-01-23 MED ORDER — POTASSIUM CHLORIDE CRYS ER 20 MEQ PO TBCR
20.0000 meq | EXTENDED_RELEASE_TABLET | Freq: Two times a day (BID) | ORAL | Status: DC
  Filled 2012-01-23 (×2): qty 1

## 2012-01-23 MED ORDER — SODIUM CHLORIDE 0.9 % IV SOLN
INTRAVENOUS | Status: AC
  Filled 2012-01-23: qty 500

## 2012-01-23 MED ORDER — MEPERIDINE HCL 25 MG/ML IJ SOLN: 6.2500 mg | INTRAMUSCULAR | Status: DC | PRN

## 2012-01-23 MED ORDER — SODIUM CHLORIDE 0.9 % IV SOLN
INTRAVENOUS | Status: DC | PRN
  Administered 2012-01-23: 12:00:00 via INTRAVENOUS

## 2012-01-23 MED ORDER — ONDANSETRON HCL 4 MG/2ML IJ SOLN: 4.0000 mg | Freq: Four times a day (QID) | INTRAMUSCULAR | Status: DC | PRN

## 2012-01-23 MED ORDER — ONDANSETRON HCL 4 MG/2ML IJ SOLN: 4.0000 mg | INTRAMUSCULAR | Status: DC | PRN

## 2012-01-23 MED ORDER — ROCURONIUM BROMIDE 100 MG/10ML IV SOLN
INTRAVENOUS | Status: DC | PRN
Start: 2012-01-23 — End: 2012-01-23
  Administered 2012-01-23: 20 mg via INTRAVENOUS
  Administered 2012-01-23: 50 mg via INTRAVENOUS
  Administered 2012-01-23: 10 mg via INTRAVENOUS
  Administered 2012-01-23: 20 mg via INTRAVENOUS

## 2012-01-23 MED ORDER — SODIUM CHLORIDE 0.9 % IJ SOLN: 9.0000 mL | INTRAMUSCULAR | Status: DC | PRN

## 2012-01-23 MED ORDER — SODIUM CHLORIDE 0.9 % IV SOLN
250.0000 mL | INTRAVENOUS | Status: DC
  Administered 2012-01-28: 250 mL via INTRAVENOUS

## 2012-01-23 MED ORDER — CEFAZOLIN SODIUM 1-5 GM-% IV SOLN
1.0000 g | Freq: Three times a day (TID) | INTRAVENOUS | Status: AC
  Administered 2012-01-23 – 2012-01-24 (×2): 1 g via INTRAVENOUS
  Filled 2012-01-23 (×2): qty 50

## 2012-01-23 MED ORDER — CARISOPRODOL 350 MG PO TABS
350.0000 mg | ORAL_TABLET | Freq: Four times a day (QID) | ORAL | Status: DC
  Administered 2012-01-24 – 2012-01-29 (×21): 350 mg via ORAL
  Filled 2012-01-23 (×23): qty 1

## 2012-01-23 MED ORDER — MENTHOL 3 MG MT LOZG: 1.0000 | LOZENGE | OROMUCOSAL | Status: DC | PRN

## 2012-01-23 MED ORDER — LEVOTHYROXINE SODIUM 100 MCG PO TABS
100.0000 ug | ORAL_TABLET | Freq: Every day | ORAL | Status: DC
  Administered 2012-01-24 – 2012-01-29 (×6): 100 ug via ORAL
  Filled 2012-01-23 (×9): qty 1

## 2012-01-23 MED ORDER — ESTRADIOL 1 MG PO TABS
0.5000 mg | ORAL_TABLET | Freq: Every day | ORAL | Status: DC
  Administered 2012-01-24 – 2012-01-25 (×3): 0.5 mg via ORAL
  Administered 2012-01-26: 11:00:00 via ORAL
  Administered 2012-01-27 – 2012-01-28 (×2): 0.5 mg via ORAL
  Administered 2012-01-29: 11:00:00 via ORAL
  Filled 2012-01-23 (×8): qty 0.5

## 2012-01-23 MED ORDER — CLONAZEPAM 1 MG PO TABS
1.0000 mg | ORAL_TABLET | Freq: Every day | ORAL | Status: DC
  Administered 2012-01-24 – 2012-01-28 (×4): 1 mg via ORAL
  Filled 2012-01-23 (×6): qty 1

## 2012-01-23 MED ORDER — ACETAMINOPHEN 325 MG PO TABS: 650.0000 mg | ORAL_TABLET | ORAL | Status: DC | PRN

## 2012-01-23 MED ORDER — HYDROMORPHONE HCL PF 1 MG/ML IJ SOLN
0.5000 mg | INTRAMUSCULAR | Status: DC | PRN
  Administered 2012-01-23 (×2): 0.5 mg via INTRAVENOUS
  Administered 2012-01-23 – 2012-01-29 (×26): 1 mg via INTRAVENOUS
  Filled 2012-01-23 (×31): qty 1

## 2012-01-23 MED ORDER — HYDROCHLOROTHIAZIDE 25 MG PO TABS
25.0000 mg | ORAL_TABLET | Freq: Every day | ORAL | Status: DC
  Administered 2012-01-25 – 2012-01-28 (×4): 25 mg via ORAL
  Filled 2012-01-23 (×7): qty 1

## 2012-01-23 MED ORDER — WHITE PETROLATUM GEL
Status: AC
  Filled 2012-01-23: qty 5

## 2012-01-23 MED ORDER — GLYCOPYRROLATE 0.2 MG/ML IJ SOLN
INTRAMUSCULAR | Status: DC | PRN
  Administered 2012-01-23: .8 mg via INTRAVENOUS

## 2012-01-23 MED ORDER — EPHEDRINE SULFATE 50 MG/ML IJ SOLN
INTRAMUSCULAR | Status: DC | PRN
  Administered 2012-01-23 (×4): 5 mg via INTRAVENOUS
  Administered 2012-01-23: 10 mg via INTRAVENOUS

## 2012-01-23 MED ORDER — ACETAMINOPHEN 650 MG RE SUPP: 650.0000 mg | RECTAL | Status: DC | PRN

## 2012-01-23 MED ORDER — 0.9 % SODIUM CHLORIDE (POUR BTL) OPTIME
TOPICAL | Status: DC | PRN
  Administered 2012-01-23: 1000 mL

## 2012-01-23 MED ORDER — HYDROCODONE-ACETAMINOPHEN 5-325 MG PO TABS
1.0000 | ORAL_TABLET | ORAL | Status: DC
  Administered 2012-01-24 – 2012-01-29 (×25): 1 via ORAL
  Filled 2012-01-23 (×28): qty 1

## 2012-01-23 MED ORDER — PHENOL 1.4 % MT LIQD: 1.0000 | OROMUCOSAL | Status: DC | PRN

## 2012-01-23 MED ORDER — SODIUM CHLORIDE 0.9 % IJ SOLN
3.0000 mL | Freq: Two times a day (BID) | INTRAMUSCULAR | Status: DC
  Administered 2012-01-24 – 2012-01-25 (×2): 3 mL via INTRAVENOUS
  Administered 2012-01-26: 11:00:00 via INTRAVENOUS
  Administered 2012-01-26 – 2012-01-29 (×3): 3 mL via INTRAVENOUS

## 2012-01-23 MED ORDER — SODIUM CHLORIDE 0.9 % IR SOLN
Status: DC | PRN
  Administered 2012-01-23: 10:00:00

## 2012-01-23 MED ORDER — ONDANSETRON HCL 4 MG/2ML IJ SOLN
INTRAMUSCULAR | Status: DC | PRN
  Administered 2012-01-23 (×2): 4 mg via INTRAVENOUS

## 2012-01-23 MED ORDER — THROMBIN 20000 UNITS EX KIT
PACK | CUTANEOUS | Status: DC | PRN
  Administered 2012-01-23: 12:00:00 via TOPICAL

## 2012-01-23 MED ORDER — MIDAZOLAM HCL 5 MG/5ML IJ SOLN
INTRAMUSCULAR | Status: DC | PRN
  Administered 2012-01-23: 2 mg via INTRAVENOUS

## 2012-01-23 MED ORDER — NEOSTIGMINE METHYLSULFATE 1 MG/ML IJ SOLN
INTRAMUSCULAR | Status: DC | PRN
  Administered 2012-01-23: 5 mg via INTRAVENOUS

## 2012-01-23 MED ORDER — LIDOCAINE HCL (CARDIAC) 20 MG/ML IV SOLN
INTRAVENOUS | Status: DC | PRN
  Administered 2012-01-23: 50 mg via INTRAVENOUS
  Administered 2012-01-23: 80 mg via INTRAVENOUS

## 2012-01-23 MED ORDER — CEFAZOLIN SODIUM-DEXTROSE 2-3 GM-% IV SOLR
INTRAVENOUS | Status: AC
  Filled 2012-01-23: qty 50

## 2012-01-23 MED ORDER — CYCLOBENZAPRINE HCL 10 MG PO TABS
ORAL_TABLET | ORAL | Status: AC
  Administered 2012-01-23: 10 mg via ORAL
  Filled 2012-01-23: qty 1

## 2012-01-23 MED ORDER — POTASSIUM CHLORIDE IN NACL 20-0.9 MEQ/L-% IV SOLN
INTRAVENOUS | Status: DC
  Administered 2012-01-23: 75 mL/h via INTRAVENOUS
  Administered 2012-01-24: 13:00:00 via INTRAVENOUS
  Filled 2012-01-23 (×12): qty 1000

## 2012-01-23 MED ORDER — KETOROLAC TROMETHAMINE 30 MG/ML IJ SOLN
INTRAMUSCULAR | Status: AC
  Filled 2012-01-23: qty 1

## 2012-01-23 MED ORDER — KETOROLAC TROMETHAMINE 30 MG/ML IJ SOLN
15.0000 mg | Freq: Once | INTRAMUSCULAR | Status: AC | PRN
  Administered 2012-01-23: 30 mg via INTRAVENOUS

## 2012-01-23 MED ORDER — PROPOFOL 10 MG/ML IV EMUL
INTRAVENOUS | Status: DC | PRN
  Administered 2012-01-23: 150 mg via INTRAVENOUS

## 2012-01-23 MED ORDER — VECURONIUM BROMIDE 10 MG IV SOLR
INTRAVENOUS | Status: DC | PRN
  Administered 2012-01-23: 1 mg via INTRAVENOUS
  Administered 2012-01-23: 3 mg via INTRAVENOUS
  Administered 2012-01-23: 1 mg via INTRAVENOUS
  Administered 2012-01-23: 2 mg via INTRAVENOUS
  Administered 2012-01-23: 3 mg via INTRAVENOUS
  Administered 2012-01-23 (×2): 1 mg via INTRAVENOUS

## 2012-01-23 MED ORDER — SIMVASTATIN 20 MG PO TABS
20.0000 mg | ORAL_TABLET | Freq: Every day | ORAL | Status: DC
  Administered 2012-01-24 – 2012-01-28 (×5): 20 mg via ORAL
  Filled 2012-01-23 (×9): qty 1

## 2012-01-23 MED ORDER — NICOTINE 14 MG/24HR TD PT24
14.0000 mg | MEDICATED_PATCH | TRANSDERMAL | Status: DC
  Administered 2012-01-24 – 2012-01-28 (×6): 14 mg via TRANSDERMAL
  Filled 2012-01-23 (×8): qty 1

## 2012-01-23 MED ORDER — CYCLOBENZAPRINE HCL 10 MG PO TABS
10.0000 mg | ORAL_TABLET | Freq: Three times a day (TID) | ORAL | Status: DC | PRN
  Administered 2012-01-23 – 2012-01-25 (×3): 10 mg via ORAL
  Filled 2012-01-23 (×3): qty 1

## 2012-01-23 SURGICAL SUPPLY — 75 items
BAG DECANTER FOR FLEXI CONT (MISCELLANEOUS) ×2 IMPLANT
BENZOIN TINCTURE PRP APPL 2/3 (GAUZE/BANDAGES/DRESSINGS) ×2 IMPLANT
BLADE SURG 11 STRL SS (BLADE) ×2 IMPLANT
BLADE SURG ROTATE 9660 (MISCELLANEOUS) IMPLANT
BRUSH SCRUB EZ PLAIN DRY (MISCELLANEOUS) ×2 IMPLANT
BUR MATCHSTICK NEURO 3.0 LAGG (BURR) ×2 IMPLANT
BUR PRECISION FLUTE 6.0 (BURR) ×2 IMPLANT
CANISTER SUCTION 2500CC (MISCELLANEOUS) ×2 IMPLANT
CLOTH BEACON ORANGE TIMEOUT ST (SAFETY) ×2 IMPLANT
CONNECTOR SFX SIZE A4 (Orthopedic Implant) ×2 IMPLANT
CONT SPEC 4OZ CLIKSEAL STRL BL (MISCELLANEOUS) ×6 IMPLANT
COVER BACK TABLE 24X17X13 BIG (DRAPES) IMPLANT
COVER TABLE BACK 60X90 (DRAPES) ×2 IMPLANT
DECANTER SPIKE VIAL GLASS SM (MISCELLANEOUS) ×2 IMPLANT
DERMABOND ADVANCED (GAUZE/BANDAGES/DRESSINGS) ×2
DERMABOND ADVANCED .7 DNX12 (GAUZE/BANDAGES/DRESSINGS) ×2 IMPLANT
DRAPE C-ARM 42X72 X-RAY (DRAPES) ×4 IMPLANT
DRAPE C-ARMOR (DRAPES) ×2 IMPLANT
DRAPE LAPAROTOMY 100X72X124 (DRAPES) ×2 IMPLANT
DRAPE POUCH INSTRU U-SHP 10X18 (DRAPES) ×2 IMPLANT
DRAPE PROXIMA HALF (DRAPES) IMPLANT
DRAPE SURG 17X23 STRL (DRAPES) ×4 IMPLANT
DRSG OPSITE 4X5.5 SM (GAUZE/BANDAGES/DRESSINGS) ×2 IMPLANT
ELECT REM PT RETURN 9FT ADLT (ELECTROSURGICAL) ×2
ELECTRODE REM PT RTRN 9FT ADLT (ELECTROSURGICAL) ×1 IMPLANT
EVACUATOR 3/16  PVC DRAIN (DRAIN) ×1
EVACUATOR 3/16 PVC DRAIN (DRAIN) ×1 IMPLANT
Expedium 5.5 Cobalt Chrome Rod 480mm (Rod) ×2 IMPLANT
GAUZE SPONGE 4X4 16PLY XRAY LF (GAUZE/BANDAGES/DRESSINGS) ×4 IMPLANT
GLOVE BIO SURGEON STRL SZ8 (GLOVE) ×4 IMPLANT
GLOVE BIOGEL PI IND STRL 7.5 (GLOVE) ×2 IMPLANT
GLOVE BIOGEL PI IND STRL 8 (GLOVE) ×4 IMPLANT
GLOVE BIOGEL PI INDICATOR 7.5 (GLOVE) ×2
GLOVE BIOGEL PI INDICATOR 8 (GLOVE) ×4
GLOVE ECLIPSE 7.5 STRL STRAW (GLOVE) ×14 IMPLANT
GLOVE EXAM NITRILE LRG STRL (GLOVE) IMPLANT
GLOVE EXAM NITRILE MD LF STRL (GLOVE) ×4 IMPLANT
GLOVE EXAM NITRILE XL STR (GLOVE) IMPLANT
GLOVE EXAM NITRILE XS STR PU (GLOVE) IMPLANT
GLOVE INDICATOR 8.5 STRL (GLOVE) ×4 IMPLANT
GOWN BRE IMP SLV AUR LG STRL (GOWN DISPOSABLE) ×2 IMPLANT
GOWN BRE IMP SLV AUR XL STRL (GOWN DISPOSABLE) ×8 IMPLANT
GOWN STRL REIN 2XL LVL4 (GOWN DISPOSABLE) ×4 IMPLANT
KIT BASIN OR (CUSTOM PROCEDURE TRAY) ×2 IMPLANT
KIT INFUSE SMALL (Orthopedic Implant) ×2 IMPLANT
KIT ROOM TURNOVER OR (KITS) ×2 IMPLANT
MARKER SKIN DUAL TIP RULER LAB (MISCELLANEOUS) ×2 IMPLANT
MASTERGRAFT STRIP 10CM ×6 IMPLANT
MATRIX MASTERGRAFT STRIP 10CM ×3 IMPLANT
MILL MEDIUM DISP (BLADE) ×2 IMPLANT
MIX DBX 10CC 35% BONE (Bone Implant) ×2 IMPLANT
NEEDLE HYPO 25X1 1.5 SAFETY (NEEDLE) ×2 IMPLANT
NS IRRIG 1000ML POUR BTL (IV SOLUTION) ×2 IMPLANT
PACK LAMINECTOMY NEURO (CUSTOM PROCEDURE TRAY) ×2 IMPLANT
PAD ARMBOARD 7.5X6 YLW CONV (MISCELLANEOUS) ×6 IMPLANT
PATTIES SURGICAL 1X1 (DISPOSABLE) ×2 IMPLANT
SCREW EXPEDIUM POLY 5X40 (Screw) ×12 IMPLANT
SCREW EXPEDIUM POLYAXIAL 6X40 (Screw) ×4 IMPLANT
SCREW EXPEDIUM POLYAXIAL 6X45M (Screw) ×8 IMPLANT
SCREW POLYAX 4.35X40MM (Screw) ×2 IMPLANT
SCREW SET SINGLE INNER (Screw) ×28 IMPLANT
SPACER CALIBER 10X22MM 8-12MM (Spacer) ×8 IMPLANT
SPONGE GAUZE 4X4 12PLY (GAUZE/BANDAGES/DRESSINGS) ×2 IMPLANT
SPONGE LAP 4X18 X RAY DECT (DISPOSABLE) IMPLANT
SPONGE SURGIFOAM ABS GEL 100 (HEMOSTASIS) ×2 IMPLANT
STRIP CLOSURE SKIN 1/2X4 (GAUZE/BANDAGES/DRESSINGS) ×2 IMPLANT
SUT VIC AB 0 CT1 18XCR BRD8 (SUTURE) ×2 IMPLANT
SUT VIC AB 0 CT1 8-18 (SUTURE) ×2
SUT VIC AB 2-0 CT1 18 (SUTURE) ×6 IMPLANT
SUT VICRYL 4-0 PS2 18IN ABS (SUTURE) ×4 IMPLANT
SYR 20ML ECCENTRIC (SYRINGE) ×2 IMPLANT
TOWEL OR 17X24 6PK STRL BLUE (TOWEL DISPOSABLE) ×2 IMPLANT
TOWEL OR 17X26 10 PK STRL BLUE (TOWEL DISPOSABLE) ×2 IMPLANT
TRAY FOLEY CATH 14FRSI W/METER (CATHETERS) ×2 IMPLANT
WATER STERILE IRR 1000ML POUR (IV SOLUTION) ×2 IMPLANT

## 2012-01-23 NOTE — Transfer of Care (Signed)
Immediate Anesthesia Transfer of Care Note  Patient: Joan [name]  Procedure(s) Performed: Procedure(s) (LRB): POSTERIOR LUMBAR FUSION 2 LEVEL (N/A)  Patient Location: PACU  Anesthesia Type: General  Level of Consciousness: awake, alert , oriented and patient cooperative  Airway & Oxygen Therapy: Patient Spontanous Breathing and Patient connected to face mask oxygen  Post-op Assessment: Report given to PACU RN, Post -op Vital signs reviewed and stable and Patient moving all extremities  Post vital signs: Reviewed and stable  Complications: No apparent anesthesia complications

## 2012-01-23 NOTE — Preoperative (Signed)
Beta Blockers   Reason not to administer Beta Blockers:Not Applicable 

## 2012-01-23 NOTE — Anesthesia Preprocedure Evaluation (Addendum)
Anesthesia Evaluation  Patient identified by MRN, date of birth, ID band Patient awake    Reviewed: Allergy & Precautions, H&P , NPO status , Patient's Chart, lab work & pertinent test results  History of Anesthesia Complications Negative for: history of anesthetic complications  Airway Mallampati: I  Neck ROM: Full    Dental  (+) Teeth Intact, Caps and Dental Advisory Given   Pulmonary sleep apnea , Current Smoker,  breath sounds clear to auscultation        Cardiovascular hypertension, Pt. on medications + dysrhythmias Rhythm:Regular Rate:Normal     Neuro/Psych PSYCHIATRIC DISORDERS Anxiety Depression    GI/Hepatic GERD-  Controlled,  Endo/Other  Hypothyroidism   Renal/GU      Musculoskeletal   Abdominal   Peds  Hematology   Anesthesia Other Findings   Reproductive/Obstetrics                          Anesthesia Physical Anesthesia Plan  ASA: II  Anesthesia Plan: General   Post-op Pain Management:    Induction: Intravenous  Airway Management Planned: Oral ETT  Additional Equipment:   Intra-op Plan:   Post-operative Plan: Extubation in OR  Informed Consent: I have reviewed the patients History and Physical, chart, labs and discussed the procedure including the risks, benefits and alternatives for the proposed anesthesia with the patient or authorized representative who has indicated his/her understanding and acceptance.     Plan Discussed with: CRNA and Surgeon  Anesthesia Plan Comments:         Anesthesia Quick Evaluation

## 2012-01-23 NOTE — H&P (Signed)
Joan [name] is an 66 y.o. female.   Chief Complaint: Back and bilateral leg pain left worse than right HPI: Patient is a very pleasant 67 year old female is a long-standing back pain she underwent an L3-L5 fusion back several years ago last several weeks and months of progressive some back and predominantly  greater than right leg pain. Revealed significant degenerative scoliosis above the levels of her previous 3-5 fusion with a curve that extends up into her lower thoracic spine. Patient shows marked degenerative disc disease at L1-2 and L2-3 with severe degenerative collapse and compression of both the L1-L2 nerve roots bilaterally due to patient's failure conservative treatment progression of clinical syndrome and progression and imaging findings patient recommended a decompression stabilization procedure at L2-3 with fusion construct extending up to T10 incorporating her old fusion L3-L5. Her operative course and expectations of outcome and alternatives of surgery she understands and agrees to proceed forward.  Past Medical History  Diagnosis Date  . Chronic low back pain     followed by Dr Vear Clock pain mgt  . Hypertension   . Hyperlipemia   . Osteoarthritis of left knee     advanced  . Tobacco abuse   . Hypothyroidism   . History of cardiac arrhythmia     cardiologist- Traci Turner  . Esophageal stricture   . Thyroid nodule   . Colon polyp   . Sleep apnea     10-15 YRS AGO DOES NOT USE MACH  . Dysrhythmia     IRREG     . GERD (gastroesophageal reflux disease)     Past Surgical History  Procedure Date  . Other surgical history     4 Lumbar surgeries  . Lumbar fusion     and rods  . Cesarean section   . Ankle surgery     left ankle 1995  . Infusion pump implantation     history of implantablet morphine pump  . Partial hysterectomy   . Knee surgery 1991  . Elbow surgery 1990's  . Thyroidectomy     2012  . Cervical spine surgery     2012  . Foot surgery     RT  FOOT SPUR REMOVED 2012   . Shoulder surgery     09/2011  LEFT  . Abdominal hysterectomy   . Back surgery     BACK X 4     Family History  Problem Relation Age of Onset  . Pancreatic cancer Father     deceased age 10  . Cancer Father   . Diabetes type II Mother     deceased age 80  . Hypertension Mother   . Coronary artery disease Mother   . Diabetes Mother   . Cancer Mother     Thyroid and Skin  . Diabetes Sister   . Hypertension Sister   . Cancer Sister     Breast  . Other Neg Hx     No family history of  colon cancer  . Hypertension Brother    Social History:  reports that she has been smoking.  She does not have any smokeless tobacco history on file. She reports that she does not drink alcohol or use illicit drugs.  Allergies:  Allergies  Allergen Reactions  . Gabapentin     REACTION: Itching Ask patient to clarify "Neurogan" on history form dated 09/25/10.  Marland Kitchen Hydrochlorothiazide W-Triamterene     REACTION: rash and itching    Medications Prior to Admission  Medication Sig Dispense  Refill  . bisacodyl (DULCOLAX) 5 MG EC tablet Take 5 mg by mouth at bedtime.      . carisoprodol (SOMA) 350 MG tablet Take 350 mg by mouth every 6 (six) hours as needed. For muscle spasms      . clonazePAM (KLONOPIN) 1 MG tablet Take 1 mg by mouth at bedtime as needed. For sleep      . Cyanocobalamin (VITAMIN B-12) 5000 MCG SUBL Place 1 tablet under the tongue daily.      Marland Kitchen estradiol (ESTRACE) 0.5 MG tablet Take 1 tablet by mouth daily.      Marland Kitchen GLUCOSAMINE PO Take 2 tablets by mouth daily.      . hydrochlorothiazide (HYDRODIURIL) 25 MG tablet Take 1 tablet (25 mg total) by mouth daily.  90 tablet  1  . HYDROcodone-acetaminophen (NORCO) 10-325 MG per tablet Take 1 tablet by mouth every 4 (four) hours.       Marland Kitchen levothyroxine (SYNTHROID, LEVOTHROID) 100 MCG tablet Take 1 tablet (100 mcg total) by mouth daily.  90 tablet  1  . nicotine (NICODERM CQ - DOSED IN MG/24 HOURS) 14 mg/24hr patch  Place 1 patch onto the skin daily.      . Omega-3 Fatty Acids (FISH OIL) 1200 MG CAPS Take 2,400 mg by mouth daily.      Marland Kitchen omeprazole (PRILOSEC) 20 MG capsule Take 1 capsule (20 mg total) by mouth 2 (two) times daily.  180 capsule  1  . potassium chloride SA (KLOR-CON M20) 20 MEQ tablet Take 1 tablet (20 mEq total) by mouth 2 (two) times daily.  180 tablet  2  . simvastatin (ZOCOR) 20 MG tablet Take 1 tablet (20 mg total) by mouth at bedtime.  90 tablet  1    No results found for this or any previous visit (from the past 48 hour(s)). No results found.  Review of Systems  Constitutional: Negative.   HENT: Negative.   Eyes: Negative.   Respiratory: Negative.   Cardiovascular: Negative.   Gastrointestinal: Negative.   Genitourinary: Negative.   Musculoskeletal: Negative.   Skin: Negative.   Neurological: Negative.   Psychiatric/Behavioral: Negative.     Blood pressure 100/64, pulse 54, temperature 98.2 F (36.8 C), temperature source Oral, resp. rate 18, SpO2 98.00%. Physical Exam  Constitutional: She is oriented to person, place, and time. She appears well-developed.  HENT:  Head: Normocephalic.  Eyes: Pupils are equal, round, and reactive to light.  Neck: Normal range of motion.  Respiratory: Effort normal and breath sounds normal.  GI: Soft.  Musculoskeletal: Normal range of motion.  Neurological: She is alert and oriented to person, place, and time. She has normal strength. GCS eye subscore is 4. GCS verbal subscore is 5. GCS motor subscore is 6.  Reflex Scores:      Patellar reflexes are 0 on the right side and 0 on the left side.      Achilles reflexes are 0 on the right side and 0 on the left side.      5/5 in her iliopsoas, quads, hamtrings, gastrocs, anterior tibialis, and EHL.     Assessment/Plan 66yo female presents for a T10-L5 fusion.  Joan [name] P 01/23/2012, 8:08 AM

## 2012-01-23 NOTE — Op Note (Signed)
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, severe lumbar spinal stenosis L1-L2 3 with bi-foraminal stenosis of the L1-L2 and L3 nerve roots severe degenerative disc disease at T12-L1 L1-2 and L2-3  Postoperative diagnosis: Same  Procedure: #1 decompressive lumbar laminectomy L1-2 and L2-3 in excess of what would be needed with a standard interbody fusion  #2 exploration of fusion removal of hardware L3-L5  #3 posterior lumbar interbody fusion L1-2 and L2-3 using the caliper expandable peek cages locally harvested autograft mixed with DBX and BMP  #4 pedicle screw fixation T10-L5 using the expedium pedicle screw system 5.5 with a cobalt chrome rod with 50 by 40 screws inserted at T10, T11, T12, 4.35 x 40 mm screw at L1, 60 by 40 mm screws inserted at L2 and 60 by 40 mm screws inserted at L3 and L4  #5 posterior lateral arthrodesis T10-L5 using locally harvested autograft mixed with DBX BMP and master Cindi Carbon  #6 open reduction spinal deformity T10-L3 and placement of a large Hemovac drain  Surgeon: Jillyn Hidden Laurenashley Viar  Assistant: Shirlean Kelly  Anesthesia: Gen.  EBL: 850 cc with 350 cc of Cell Saver given back  History of present illness: Patient is a very pleasant 66 year old female who has had long-standing back and bilateral leg pain worse on the left several years patient undergone L3-L5 fusion many many years ago and has developed progressively worse back and prominent left buttock and leg pain or cup with plain films MRI and CT revealed severe lumbar spinal stenosis and degenerative collapse at L1-2 and progressive degenerative on top of the idiopathic scoliosis from T10-L3. Due to patient's failure of conservative treatment imaging findings and progression of clinical syndrome patient was recommended decompression procedure at L1-2 and L2-3 and a open reduction fixation of her scoliosis from T10-L3 patient had old fusion L3-L5 with a instrumentation set  that was not available a more so I recommended removal of the instrumentation with replacement of new screws at L3 and L4. An extensor reviewed the risks and benefits of the operation with her as well as perioperative course expectations of outcome and alternatives of surgery and she understands and agreed to proceed forward.  Operative procedure: Patient brought into the or was induced under general anesthesia and positioned prone the Wilson frame her back was prepped and draped in routine sterile fashion. Her old incision was infiltrated with 10 cc lidocaine with epi and this incision was opened up and extended cephalad up to the level of the T10 and T9 spinous process. Subperiosteal dissection was carried out from T10-T11 T12-L1 L2-L3 and the old hardware was identified and exposed. The TPS at T10-T11 12 12 and 3 were all exposed and then the spinous processes at L1-L2 with a removed central decompression was begun there was extensive amount of scar tissue in the upper aspect of the to 3 interspace, him into the 3-5 fusion 35 fusion did appear to be intact and solid however there was a fair amount of the 3 lamina sterile residual was extensively adhesed to the dura this was teased away during the decompression please medial facetectomies were performed at L1-2 and L2-3 there is profound scoliotic deformity at these levels with collapse on the left side is a disc space at L1-L2 3 so care was taken to get lateral underneath the superior tickling facet complex to gain lateral aspect access to the disc. After all decompression been begun and the L1-L2 and L3 nerve roots were all skeletonized decompressed flush with  the pedicles, the spaces were incised bilaterally a retractor was placed on the patient's right-sided L2-3 helped open up the disc space and gain access to the left side which was severely collapsed and degenerated this allowed me to put a distractor in the left side open up the disc space and then  cleaned out with rotating cutters pituitaries and Epstein curettes. An 8 mm graft was selected it was packed with autograft mixed with DBX after adequate endplate preparation been achieved at L2-3 and left the 8 mm cage was placed and expanded to approximately 9 9/2 mm the distractors removed fluoroscopy used the step along the way confirm good position of the implant then on the L1 to distractor was also inserted on the patient's left side but also transferred to the patient's right side open up that space is well and again the patient's left side this disc space was markedly collapsed and degenerated fluoroscopy was used to identify the space and allow adequate placement of the distractor to working on the patient's right the endplates were prepared at L2-3-1 similar fashion disc spaces cleanout central disc was scraped and removed local autograft BMP and DBX was all packed centrally and a right-sided cage was inserted this helped reduce the deformity L2-3 then at L1-1 similar fashion disc spaces cleanout radically in both side endplates were prepped 8 mm graft were also placed at the side at L2-3 respectively with local are graft BMP and DBX packed centrally after only about work been done at L1-L2 3 distal extremities that was significantly improved the attention was then taken to pedicle screw placement using a combination of AP and lateral fluoroscopy pilot holes were drilled identify the lateral mid present in the mid position of the pedicles from T10 T11-T12 as well as L1 then using lateral fluoroscopy all these pedicles were probed cannula with the awl probed the T10-11 and 12 pedicles were all tapped with a 435 tap with 50 by 40 screws inserted at L1 because of pedicle so small it was With a 35 435 screw to that at L2-1 60 by 40 screw was placed with a 50 tap the with all these pedicles there were all probed to confirm no mediolateral breech AP and lateral fluoroscopy confirmed good position of all the  screws to after all screws and placed and attention was taken to first the right-sided rod was cut and contoured and bent with kyphosis and then some of the scoliosis was reduced by compressing the right side and after this was done aggressive decortication was care MTPs or lateral gutters bilaterally all the remainder the local autograft DBX master graft and BMP was packed posterior laterally from T10-L4 bilaterally then the left side a rod was contoured all screws and were tightened down the T10-T11 disc spaces were then compressed on the left to help reduce the deformity at the end after the construct was placed across it was placed all the foraminal reinspected confirm patency and no migration of graft material and after this was confirmed Gelfoam was overlaid top of the dura a large Hemovac drain was placed in the room was closed in layers with after Vicryl and a running 4 septic or and skin benzoin and Steri-Strips were applied patient recovered in stable condition at the end of case all needle counts and sponge counts were correct.

## 2012-01-23 NOTE — Anesthesia Postprocedure Evaluation (Signed)
  Anesthesia Post-op Note  Patient: Joan [name]  Procedure(s) Performed: Procedure(s) (LRB): POSTERIOR LUMBAR FUSION 2 LEVEL (N/A)  Patient Location: PACU  Anesthesia Type: General  Level of Consciousness: awake  Airway and Oxygen Therapy: Patient Spontanous Breathing  Post-op Pain: mild  Post-op Assessment: Post-op Vital signs reviewed  Post-op Vital Signs: stable  Complications: No apparent anesthesia complications

## 2012-01-23 NOTE — Anesthesia Procedure Notes (Signed)
Procedure Name: Intubation Date/Time: 01/23/2012 8:44 AM Performed by: Sherie Don Pre-anesthesia Checklist: Patient identified, Emergency Drugs available, Suction available, Patient being monitored and Timeout performed Patient Re-evaluated:Patient Re-evaluated prior to inductionOxygen Delivery Method: Circle system utilized Preoxygenation: Pre-oxygenation with 100% oxygen Intubation Type: IV induction Ventilation: Mask ventilation without difficulty Laryngoscope Size: Mac and 3 Grade View: Grade I Tube size: 7.5 mm Number of attempts: 1 Airway Equipment and Method: Stylet Placement Confirmation: ETT inserted through vocal cords under direct vision,  positive ETCO2 and breath sounds checked- equal and bilateral Secured at: 21 cm Tube secured with: Tape Dental Injury: Teeth and Oropharynx as per pre-operative assessment

## 2012-01-24 MED ORDER — HYDROMORPHONE HCL 2 MG PO TABS
4.0000 mg | ORAL_TABLET | ORAL | Status: DC | PRN
  Administered 2012-01-24 – 2012-01-28 (×17): 4 mg via ORAL
  Filled 2012-01-24 (×8): qty 2
  Filled 2012-01-24: qty 1
  Filled 2012-01-24 (×5): qty 2
  Filled 2012-01-24: qty 1
  Filled 2012-01-24 (×4): qty 2

## 2012-01-24 MED ORDER — METHYLPREDNISOLONE 4 MG PO KIT
8.0000 mg | PACK | Freq: Every evening | ORAL | Status: AC
  Administered 2012-01-24: 8 mg via ORAL

## 2012-01-24 MED ORDER — METHYLPREDNISOLONE 4 MG PO KIT
4.0000 mg | PACK | Freq: Three times a day (TID) | ORAL | Status: AC
  Administered 2012-01-25 (×3): 4 mg via ORAL

## 2012-01-24 MED ORDER — METHYLPREDNISOLONE 4 MG PO KIT
8.0000 mg | PACK | Freq: Every morning | ORAL | Status: AC
  Administered 2012-01-24: 8 mg via ORAL
  Filled 2012-01-24: qty 21

## 2012-01-24 MED ORDER — METHYLPREDNISOLONE 4 MG PO KIT
4.0000 mg | PACK | ORAL | Status: AC
  Administered 2012-01-24: 4 mg via ORAL

## 2012-01-24 MED ORDER — METHYLPREDNISOLONE 4 MG PO KIT
8.0000 mg | PACK | Freq: Every evening | ORAL | Status: AC
  Administered 2012-01-25: 8 mg via ORAL

## 2012-01-24 MED ORDER — POTASSIUM CHLORIDE CRYS ER 20 MEQ PO TBCR
40.0000 meq | EXTENDED_RELEASE_TABLET | Freq: Two times a day (BID) | ORAL | Status: DC
  Administered 2012-01-24 – 2012-01-29 (×10): 40 meq via ORAL
  Filled 2012-01-24 (×12): qty 2

## 2012-01-24 MED ORDER — ENSURE COMPLETE PO LIQD
237.0000 mL | Freq: Every day | ORAL | Status: DC
  Administered 2012-01-24 – 2012-01-28 (×4): 237 mL via ORAL

## 2012-01-24 MED ORDER — METHYLPREDNISOLONE 4 MG PO KIT
4.0000 mg | PACK | Freq: Four times a day (QID) | ORAL | Status: DC
  Administered 2012-01-26 – 2012-01-29 (×9): 4 mg via ORAL
  Filled 2012-01-24: qty 21

## 2012-01-24 NOTE — Evaluation (Signed)
Physical Therapy Evaluation Patient Details Name: Joan [name] MRN: [id number] DOB: 27-Nov-1945 Today's Date: 01/24/2012 Time: 4098-1191 PT Time Calculation (min): 29 min  PT Assessment / Plan / Recommendation Clinical Impression  pt s/p multilevel lumbar decompression and fusion.  Pt presently in a lot of pain and is weak Bil LE's.  Expect pt will need HHPT after D/C to regain her Independence/    PT Assessment  Patient needs continued PT services    Follow Up Recommendations  Home health PT;Supervision for mobility/OOB    Barriers to Discharge        Equipment Recommendations  Rolling walker with 5" wheels    Recommendations for Other Services     Frequency Min 5X/week    Precautions / Restrictions Precautions Precautions: Back Required Braces or Orthoses: Spinal Brace Spinal Brace: Lumbar corset   Pertinent Vitals/Pain 10/10      Mobility  Bed Mobility Bed Mobility: Rolling Left;Left Sidelying to Sit Rolling Left: 3: Mod assist Left Sidelying to Sit: 3: Mod assist;HOB flat Details for Bed Mobility Assistance: vc's for technique/safety; truncal assist Transfers Transfers: Sit to Stand;Stand to Sit;Stand Pivot Transfers Sit to Stand: 4: Min assist;With upper extremity assist;From bed Stand to Sit: 4: Min assist;With upper extremity assist;To chair/3-in-1 Stand Pivot Transfers: 4: Min guard Details for Transfer Assistance: vc's for technique and safety; lifting and stability assist Ambulation/Gait Ambulation/Gait Assistance: Not tested (comment)    Exercises     PT Diagnosis: Difficulty walking;Generalized weakness;Acute pain  PT Problem List: Decreased strength;Decreased activity tolerance;Decreased balance;Decreased knowledge of use of DME;Decreased knowledge of precautions;Pain PT Treatment Interventions:     PT Goals Acute Rehab PT Goals PT Goal Formulation: With patient Time For Goal Achievement: 01/24/12 Potential to Achieve Goals: Good Pt will go  Supine/Side to Sit: with supervision PT Goal: Supine/Side to Sit - Progress: Goal set today Pt will go Sit to Stand: with supervision PT Goal: Sit to Stand - Progress: Goal set today Pt will Transfer Bed to Chair/Chair to Bed: with supervision PT Transfer Goal: Bed to Chair/Chair to Bed - Progress: Goal set today Pt will Ambulate: 51 - 150 feet;with supervision;with least restrictive assistive device PT Goal: Ambulate - Progress: Goal set today Pt will Go Up / Down Stairs: 1-2 stairs;with supervision;with rail(s) PT Goal: Up/Down Stairs - Progress: Goal set today  Visit Information  Last PT Received On: 01/24/12 Assistance Needed: +1    Subjective Data  Subjective: I've been through this before, but this one is much more extensive Patient Stated Goal: Home I   Prior Functioning  Home Living Lives With: Alone Available Help at Discharge: Family Type of Home: Apartment Home Access: Stairs to enter Secretary/administrator of Steps: 1 Entrance Stairs-Rails: Left;Right Home Layout: Two level;Full bath on main level;Able to live on main level with bedroom/bathroom Alternate Level Stairs-Number of Steps: 14 Alternate Level Stairs-Rails: Right;Left Bathroom Shower/Tub: Engineer, manufacturing systems: Standard Prior Function Level of Independence: Independent Able to Take Stairs?: Yes Driving: Yes Communication Communication: No difficulties    Cognition  Arousal/Alertness: Lethargic Orientation Level: Appears intact for tasks assessed Behavior During Session: [facility] for tasks performed    Extremity/Trunk Assessment Right Lower Extremity Assessment RLE ROM/Strength/Tone: Within functional levels Left Lower Extremity Assessment LLE ROM/Strength/Tone: Within functional levels (generally weak bil due to pain)   Balance Balance Balance Assessed: No  End of Session PT - End of Session Equipment Utilized During Treatment: Back brace Activity Tolerance: Patient tolerated treatment  well;Patient limited by pain Patient  left: in chair;with call bell/phone within reach;with family/visitor present Nurse Communication: Mobility status  GP     Jerald Villalona, Eliseo Gum 01/24/2012, 12:37 PM

## 2012-01-24 NOTE — Progress Notes (Signed)
Subjective: Patient reports Overall she's doing a lot of incisional pain but no leg pain  Objective: Vital signs in last 24 hours: Temp:  [97.7 F (36.5 C)-98.6 F (37 C)] 98 F (36.7 C) (07/18 0400) Pulse Rate:  [72-114] 100  (07/18 0650) Resp:  [9-26] 16  (07/18 0650) BP: (79-145)/(35-90) 88/35 mmHg (07/18 0650) SpO2:  [94 %-100 %] 100 % (07/18 0650)  Intake/Output from previous day: 07/17 0701 - 07/18 0700 In: 5710 [I.V.:4750; Blood:360; IV Piggyback:600] Out: 4385 [Urine:3090; Drains:445; Blood:850] Intake/Output this shift:    Strength out of 5 wound clean and dry blood pressure labile she dropped her systolics with her PCA  Lab Results:  Basename 01/23/12 1637  WBC 11.4*  HGB 10.5*  HCT 31.1*  PLT 141*   BMET  Basename 01/23/12 1637  NA 139  K 3.2*  CL 105  CO2 24  GLUCOSE 166*  BUN 8  CREATININE 0.52  CALCIUM 7.4*    Studies/Results: Dg Lumbar Spine Complete  01/23/2012  *RADIOLOGY REPORT*  Clinical Data: Back pain  DG C-ARM GT 120 MIN,LUMBAR SPINE - COMPLETE 4+ VIEW  Comparison: None.  Findings: C-arm films document placement of pedicle screw and rod fixation plate what appears to be T10 through their floor, based on the limited field of view.  Interbody cages have been placed at L1- 2 and L2-3.  IMPRESSION: As above.  Original Report Authenticated By: Elsie Stain, M.D.   Dg C-arm Gt 120 Min  01/23/2012  *RADIOLOGY REPORT*  Clinical Data: Back pain  DG C-ARM GT 120 MIN,LUMBAR SPINE - COMPLETE 4+ VIEW  Comparison: None.  Findings: C-arm films document placement of pedicle screw and rod fixation plate what appears to be T10 through their floor, based on the limited field of view.  Interbody cages have been placed at L1- 2 and L2-3.  IMPRESSION: As above.  Original Report Authenticated By: Elsie Stain, M.D.    Assessment/Plan: Posterior day 1 from a T10-L4 fusion fairly well except for incisional pain and blood pressure lability with pain management  will wean off her discontinue her PCA (oral Dilaudid try to mobilize her a little bit also give her a little bit of serous help with the inflammatory muscle pain.2  LOS: 1 day     Joan [name] P 01/24/2012, 8:08 AM

## 2012-01-24 NOTE — Progress Notes (Signed)
INITIAL ADULT NUTRITION ASSESSMENT Date: 01/24/2012   Time: 2:32 PM Reason for Assessment: Health History  INTERVENTION: 1. Ensure Complete po daily, each supplement provides 350 kcal and 13 grams of protein. 2. RD will place dinner order 3. RD will continue to follow    ASSESSMENT: Female 66 y.o.  Dx: spinal fusion   Hx:  Past Medical History  Diagnosis Date  . Chronic low back pain     followed by Dr Vear Clock pain mgt  . Hypertension   . Hyperlipemia   . Osteoarthritis of left knee     advanced  . Tobacco abuse   . Hypothyroidism   . History of cardiac arrhythmia     cardiologist- Traci Turner  . Esophageal stricture   . Thyroid nodule   . Colon polyp   . Sleep apnea     10-15 YRS AGO DOES NOT USE MACH  . Dysrhythmia     IRREG     . GERD (gastroesophageal reflux disease)     Related Meds:     . bacitracin      . bisacodyl  5 mg Oral QHS  . carisoprodol  350 mg Oral QID  . ceFAZolin      .  ceFAZolin (ANCEF) IV  1 g Intravenous Q8H  . clonazePAM  1 mg Oral Daily  . estradiol  0.5 mg Oral Daily  . hydrochlorothiazide  25 mg Oral Daily  . HYDROcodone-acetaminophen  1 tablet Oral Q4H  . HYDROmorphone      . HYDROmorphone      . HYDROmorphone      . ketorolac      . levothyroxine  100 mcg Oral QAC breakfast  . methylPREDNISolone  4 mg Oral PC lunch  . methylPREDNISolone  4 mg Oral PC supper  . methylPREDNISolone  4 mg Oral 3 x daily with food  . methylPREDNISolone  4 mg Oral 4X daily taper  . methylPREDNISolone  8 mg Oral AC breakfast  . methylPREDNISolone  8 mg Oral Nightly  . methylPREDNISolone  8 mg Oral Nightly  . nicotine  14 mg Transdermal Q24H  . pantoprazole  40 mg Oral Q1200  . potassium chloride  40 mEq Oral BID  . simvastatin  20 mg Oral QHS  . sodium chloride      . sodium chloride  3 mL Intravenous Q12H  . white petrolatum      . DISCONTD: HYDROmorphone PCA 0.3 mg/mL   Intravenous Q4H  . DISCONTD: potassium chloride SA  20 mEq Oral BID       Ht:   Ht Readings from Last 3 Encounters:  01/15/12 5\' 2"  (1.575 m)  10/10/11 5\' 1"  (1.549 m)  04/11/11 5\' 1"  (1.549 m)     Wt:   Wt Readings from Last 5 Encounters:  01/15/12 132 lb 0.9 oz (59.9 kg)  01/09/12 130 lb (58.968 kg)  10/10/11 130 lb (58.968 kg)  04/11/11 132 lb (59.875 kg)  12/19/10 122 lb (55.339 kg)     Ideal Wt:    50 kg  % Ideal Wt: 120%  Usual Wt: 130 lbs % Usual Wt: 100%  BMI= 24.2 kg/(m^2), pt is WNL  Food/Nutrition Related Hx: pt reports good intake PTA, no recent weight changes   Labs:  CMP     Component Value Date/Time   NA 139 01/23/2012 1637   K 3.2* 01/23/2012 1637   CL 105 01/23/2012 1637   CO2 24 01/23/2012 1637   GLUCOSE 166* 01/23/2012 1637  BUN 8 01/23/2012 1637   CREATININE 0.52 01/23/2012 1637   CREATININE 0.83 12/11/2010 1036   CALCIUM 7.4* 01/23/2012 1637   PROT 6.5 12/11/2010 1036   ALBUMIN 4.4 12/11/2010 1036   AST 16 12/11/2010 1036   ALT 10 12/11/2010 1036   ALKPHOS 80 12/11/2010 1036   BILITOT 0.4 12/11/2010 1036   GFRNONAA >90 01/23/2012 1637   GFRAA >90 01/23/2012 1637     Intake/Output Summary (Last 24 hours) at 01/24/12 1435 Last data filed at 01/24/12 1400  Gross per 24 hour  Intake   1575 ml  Output   3185 ml  Net  -1610 ml     Diet Order: General  Supplements/Tube Feeding: none  IVF:    sodium chloride   0.9 % NaCl with KCl 20 mEq / L Last Rate: 75 mL/hr at 01/24/12 1320    Estimated Nutritional Needs:   Kcal: 1400-1600 Protein: 60-70 gm  Fluid:  1.5-1.6 L   Pt with good intake PTA. Now reports poor appetite r/t pain. RD took dinner order for better intake of meals. Also would like Ensure  Daily, RD will order.   NUTRITION DIAGNOSIS: -Increased nutrient needs (NI-5.1).  Status: Ongoing  RELATED TO: recent surgery  AS EVIDENCE BY: estimated nutrition needs   MONITORING/EVALUATION(Goals): Goal: PO intake of meals and supplements to promote healing  Monitor: PO intake, weight, labs  EDUCATION  NEEDS: -No education needs identified at this time   DOCUMENTATION CODES Per approved criteria  -Not Applicable    Clarene Duke RD, LDN Pager [phone] After Hours pager [phone]

## 2012-01-24 NOTE — Progress Notes (Signed)
Patient evaluated for long-term disease management services with [facility] Care Management Program as benefit of her [facility] insurance. Patient will receive a post discharge transition of care telephone call and possible monthly home visits for assessments and education. Went to patient's room to discuss [facility] services. She was moaning in pain at the time and nursing was about to give pain meds. Decided not an appropriate time to speak with patient. Will speak with patient at later time.  Raiford Noble, MSN-Ed, RN,BSN [facility] Liaison [phone]

## 2012-01-24 NOTE — Plan of Care (Signed)
Problem: Phase I Progression Outcomes Goal: OOB as tolerated unless otherwise ordered Outcome: Progressing Out of bed to chair today for 30 minutes. Tolerated well.  Alvester Morin, RN

## 2012-01-24 NOTE — Clinical Documentation Improvement (Signed)
Anemia Blood Loss Clarification  THIS DOCUMENT IS NOT A PERMANENT PART OF THE MEDICAL RECORD  RESPOND TO THE THIS QUERY, FOLLOW THE INSTRUCTIONS BELOW:  1. If needed, update documentation for the patient's encounter via the notes activity.  2. Access this query again and click edit on the In [employer].  3. After updating, or not, click F2 to complete all highlighted (required) fields concerning your review. Select "additional documentation in the medical record" OR "no additional documentation provided".  4. Click Sign note button.  5. The deficiency will fall out of your In Basket *Please let us know if you are not able to complete this workflow by phone or e-mail (listed below).        01/24/12  Dear Joan [name] Marton Redwood  In an effort to better capture your patient's severity of illness, reflect appropriate length of stay and utilization of resources, a review of the patient medical record has revealed the following indicators.    Based on your clinical judgment, please clarify and document in a progress note and/or discharge summary the clinical condition associated with the following supporting information:  In responding to this query please exercise your independent judgment.  The fact that a query is asked, does not imply that any particular answer is desired or expected.  Possible Clinical Conditions?   " Expected Acute Blood Loss Anemia  " Acute Blood Loss Anemia  " Other Condition  " Cannot Clinically Determine    Supporting Information:  Signs and Symptoms: Per 7/17 Anesthesia record: EBL=850ml.  Diagnostics: H&H on 7/09:   12.7/37.0 H&H on 7/17:    10.5/31.1 Treatments: IV fluids / plasma expanders: Albumin:    per 7/17 Anesthesia record. Cell saver: per 7/17 Anesthesia record.   Reviewed:  no additional documentation provided  Thank You,  Marciano Sequin,  Clinical Documentation Specialist:  Pager: [phone]  Health  Information Management [place]

## 2012-01-25 MED ORDER — CEFAZOLIN SODIUM 1-5 GM-% IV SOLN
1.0000 g | Freq: Three times a day (TID) | INTRAVENOUS | Status: DC
  Administered 2012-01-25 – 2012-01-29 (×11): 1 g via INTRAVENOUS
  Filled 2012-01-25 (×16): qty 50

## 2012-01-25 MED FILL — Sodium Chloride Irrigation Soln 0.9%: Qty: 3000 | Status: AC

## 2012-01-25 MED FILL — Sodium Chloride IV Soln 0.9%: INTRAVENOUS | Qty: 1000 | Status: AC

## 2012-01-25 NOTE — Progress Notes (Signed)
Physical Therapy Treatment Patient Details Name: Joan [name] MRN: [id number] DOB: 09-13-1945 Today's Date: 01/25/2012 Time: 2951-8841 PT Time Calculation (min): 25 min  PT Assessment / Plan / Recommendation Comments on Treatment Session  Much improved mobility and pain.  pt instructed /reinforced in back precautions, lifting restrictions, bracing issues, progression of activity, bed mobility issues.  Pt verbalizes, return demo's.    Follow Up Recommendations  Home health PT;Supervision for mobility/OOB    Barriers to Discharge        Equipment Recommendations       Recommendations for Other Services    Frequency Min 5X/week   Plan Discharge plan remains appropriate    Precautions / Restrictions Precautions Precautions: Back Required Braces or Orthoses: Spinal Brace Spinal Brace: Lumbar corset   Pertinent Vitals/Pain     Mobility  Bed Mobility Bed Mobility: Rolling Right;Right Sidelying to Sit Rolling Right: 4: Min guard;With rail Rolling Left: 4: Min assist Details for Bed Mobility Assistance: vc's for technique and safety, minor truncal assist Transfers Transfers: Sit to Stand;Stand to Sit Sit to Stand: 4: Min assist;With upper extremity assist;From bed Stand to Sit: 4: Min guard;To chair/3-in-1 Details for Transfer Assistance: vc's for technique and safety;  Ambulation/Gait Ambulation/Gait Assistance: 4: Min assist Ambulation Distance (Feet): 880 Feet Assistive device: None Ambulation/Gait Assistance Details: mild gait unsteadiness Gait Pattern: Step-through pattern;Decreased step length - right;Decreased step length - left;Decreased stride length Stairs: No    Exercises     PT Diagnosis:    PT Problem List:   PT Treatment Interventions:     PT Goals Acute Rehab PT Goals Time For Goal Achievement: 01/24/12 Potential to Achieve Goals: Good PT Goal: Supine/Side to Sit - Progress: Progressing toward goal PT Goal: Sit to Stand - Progress: Progressing  toward goal PT Transfer Goal: Bed to Chair/Chair to Bed - Progress: Progressing toward goal PT Goal: Ambulate - Progress: Progressing toward goal  Visit Information  Last PT Received On: 01/25/12 Assistance Needed: +1    Subjective Data      Cognition  Overall Cognitive Status: Appears within functional limits for tasks assessed/performed Arousal/Alertness: Awake/alert Orientation Level: Appears intact for tasks assessed Behavior During Session: [facility] for tasks performed    Balance  Balance Balance Assessed: No  End of Session PT - End of Session Equipment Utilized During Treatment: Back brace Activity Tolerance: Patient tolerated treatment well;Patient limited by pain Patient left: in chair;with call bell/phone within reach;with family/visitor present Nurse Communication: Mobility status   GP     Joan [name], Joan [name] 01/25/2012, 2:42 PM 01/25/2012  Joan [name] Bing, PT [phone] (pager)

## 2012-01-25 NOTE — Progress Notes (Signed)
Subjective: Patient reports She's feeling a lot better this morning pain under better control no leg pain no numbness or tingling  Objective: Vital signs in last 24 hours: Temp:  [98.2 F (36.8 C)-99.5 F (37.5 C)] 99.5 F (37.5 C) (07/18 1928) Pulse Rate:  [80-113] 88  (07/19 0600) Resp:  [9-22] 13  (07/19 0600) BP: (91-127)/(49-84) 104/54 mmHg (07/19 0600) SpO2:  [95 %-100 %] 99 % (07/19 0600) Weight:  [65 kg (143 lb 4.8 oz)] 65 kg (143 lb 4.8 oz) (07/18 1928)  Intake/Output from previous day: 07/18 0701 - 07/19 0700 In: 2650 [P.O.:1440; I.V.:1210] Out: 3515 [Urine:3125; Drains:390] Intake/Output this shift:    Strength out of 5 wound clean dry and  Lab Results:  [facility] 01/23/12 1637  WBC 11.4*  HGB 10.5*  HCT 31.1*  PLT 141*   BMET  Basename 01/23/12 1637  NA 139  K 3.2*  CL 105  CO2 24  GLUCOSE 166*  BUN 8  CREATININE 0.52  CALCIUM 7.4*    Studies/Results: Dg Lumbar Spine Complete  01/23/2012  *RADIOLOGY REPORT*  Clinical Data: Back pain  DG C-ARM GT 120 MIN,LUMBAR SPINE - COMPLETE 4+ VIEW  Comparison: None.  Findings: C-arm films document placement of pedicle screw and rod fixation plate what appears to be T10 through their floor, based on the limited field of view.  Interbody cages have been placed at L1- 2 and L2-3.  IMPRESSION: As above.  Original Report Authenticated By: Elsie Stain, M.D.   Dg C-arm Gt 120 Min  01/23/2012  *RADIOLOGY REPORT*  Clinical Data: Back pain  DG C-ARM GT 120 MIN,LUMBAR SPINE - COMPLETE 4+ VIEW  Comparison: None.  Findings: C-arm films document placement of pedicle screw and rod fixation plate what appears to be T10 through their floor, based on the limited field of view.  Interbody cages have been placed at L1- 2 and L2-3.  IMPRESSION: As above.  Original Report Authenticated By: Elsie Stain, M.D.    Assessment/Plan: Progressive mobilization today transfer the floor continue PT OT  LOS: 2 days     Chrystian Ressler  P 01/25/2012, 7:26 AM

## 2012-01-25 NOTE — Progress Notes (Signed)
Occupational Therapy Evaluation Patient Details Name: Joan [name] MRN: [id number] DOB: February 12, 1946 Today's Date: 01/25/2012 Time: 1191-4782 OT Time Calculation (min): 34 min  OT Assessment / Plan / Recommendation Clinical Impression  Pt s/p multilevel lumbar decompression and fusion thus affecting PLOF. Will benefit from acute OT services to address below problem list in prep for d/c home.    OT Assessment  Patient needs continued OT Services    Follow Up Recommendations  Home health OT;Supervision/Assistance - 24 hour    Barriers to Discharge      Equipment Recommendations   (TBD by OT)    Recommendations for Other Services    Frequency  Min 2X/week    Precautions / Restrictions Precautions Precautions: Back Required Braces or Orthoses: Spinal Brace Spinal Brace: Lumbar corset   Pertinent Vitals/Pain See vitals    ADL  Grooming: Performed;Wash/dry hands;Min guard Where Assessed - Grooming: Unsupported standing Lower Body Bathing: Simulated;Minimal assistance Where Assessed - Lower Body Bathing: Unsupported sitting Lower Body Dressing: Simulated;Minimal assistance Where Assessed - Lower Body Dressing: Unsupported sitting Toilet Transfer: Performed;Min guard Toilet Transfer Method: Other (comment) (ambulate without AD) Toilet Transfer Equipment: Comfort height toilet Toileting - Clothing Manipulation and Hygiene: Performed;Min guard Where Assessed - Glass blower/designer Manipulation and Hygiene: Standing Equipment Used: Back brace;Gait belt Transfers/Ambulation Related to ADLs: Pt ambulated to bathroom with min guard assist and no AD. ADL Comments: Pt with some difficulty crossing ankles over knees but reported she was able to do this PTA.  Educated pt on toileting hygiene technique, LB bathing/dressing technique, and shower techniques (sitting on built in seat).  Pt very fatigued during session due to recently completing PT session and could benefit from further  education reinforcment.    OT Diagnosis: Acute pain  OT Problem List: Decreased activity tolerance;Decreased knowledge of use of DME or AE;Decreased knowledge of precautions;Pain OT Treatment Interventions: Self-care/ADL training;DME and/or AE instruction;Therapeutic activities;Patient/family education   OT Goals Acute Rehab OT Goals OT Goal Formulation: With patient Time For Goal Achievement: 02/01/12 Potential to Achieve Goals: Good ADL Goals Pt Will Perform Lower Body Bathing: with modified independence;Sit to stand from chair;Sit to stand from bed;with adaptive equipment ADL Goal: Lower Body Bathing - Progress: Goal set today Pt Will Perform Lower Body Dressing: with modified independence;Sit to stand from chair;Sit to stand from bed;with adaptive equipment ADL Goal: Lower Body Dressing - Progress: Goal set today Pt Will Transfer to Toilet: with modified independence;Ambulation;with DME;Comfort height toilet;Maintaining back safety precautions ADL Goal: Toilet Transfer - Progress: Goal set today Pt Will Perform Toileting - Clothing Manipulation: with modified independence;Standing ADL Goal: Toileting - Clothing Manipulation - Progress: Goal set today Pt Will Perform Toileting - Hygiene: with modified independence;Sit to stand from 3-in-1/toilet ADL Goal: Toileting - Hygiene - Progress: Goal set today Pt Will Perform Tub/Shower Transfer: Shower transfer;with modified independence;Ambulation;Shower seat with back (pt has built in shower seat at home) ADL Goal: Web designer - Progress: Goal set today Miscellaneous OT Goals Miscellaneous OT Goal #1: Pt will independently verbalize and generalize 3/3 back precautions during all ADL activity. OT Goal: Miscellaneous Goal #1 - Progress: Goal set today  Visit Information  Last OT Received On: 01/25/12 Assistance Needed: +1    Subjective Data      Prior Functioning  Vision/Perception  Home Living Lives With: Alone Available  Help at Discharge: Family;Available PRN/intermittently Type of Home: Apartment Home Access: Stairs to enter Entrance Stairs-Number of Steps: 1 Entrance Stairs-Rails: Left;Right Home Layout: Two level;Full bath on main  level;Able to live on main level with bedroom/bathroom Alternate Level Stairs-Number of Steps: 14 Alternate Level Stairs-Rails: Right;Left Bathroom Shower/Tub: Psychologist, counselling (built in Information systems manager) Firefighter: Standard Home Adaptive Equipment: Environmental consultant - four wheeled Prior Function Level of Independence: Independent Able to Take Stairs?: Yes Driving: Yes Communication Communication: No difficulties      Cognition  Overall Cognitive Status: Appears within functional limits for tasks assessed/performed Arousal/Alertness: Lethargic Orientation Level: Appears intact for tasks assessed Behavior During Session: [facility] for tasks performed    Extremity/Trunk Assessment Right Upper Extremity Assessment RUE ROM/Strength/Tone: [facility] for tasks assessed Left Upper Extremity Assessment LUE ROM/Strength/Tone: WFL for tasks assessed (pt reports past rotator cuff injury)   Mobility Bed Mobility Bed Mobility: Sit to Sidelying Right Rolling Right: 4: Min guard;With rail Rolling Left: 4: Min assist Sit to Sidelying Right: 4: Min guard;With rail;HOB flat Details for Bed Mobility Assistance: VC for technique Transfers Transfers: Sit to Stand;Stand to Sit Sit to Stand: 4: Min guard;From chair/3-in-1;With armrests;With upper extremity assist;From toilet Stand to Sit: 4: Min guard;To chair/3-in-1;To toilet;With upper extremity assist;With armrests Details for Transfer Assistance: VC for technique   Exercise    Balance Balance Balance Assessed: No  End of Session OT - End of Session Equipment Utilized During Treatment: Gait belt;Back brace Activity Tolerance: Patient limited by fatigue;Patient limited by pain Patient left: in bed;with call bell/phone within reach;with bed alarm  set Nurse Communication: Mobility status;Patient requests pain meds  GO    01/25/2012 Cipriano Mile OTR/L Pager [phone] Office [phone]  Cipriano Mile 01/25/2012, 4:52 PM

## 2012-01-26 MED ORDER — HYDROMORPHONE HCL PF 1 MG/ML IJ SOLN
1.0000 mg | Freq: Once | INTRAMUSCULAR | Status: AC
  Administered 2012-01-26: 1 mg via INTRAVENOUS

## 2012-01-26 NOTE — Progress Notes (Signed)
Physical Therapy Treatment Patient Details Name: Joan [name] MRN: [id number] DOB: [birth year] Today's Date: 01/26/2012 Time: 2952-8413 PT Time Calculation (min): 18 min  PT Assessment / Plan / Recommendation Comments on Treatment Session  Patient progressing well with ambulation and attempt stairs this morning. Patient able to recall precautions and understand brace wear.     Follow Up Recommendations  Home health PT;Supervision for mobility/OOB    Barriers to Discharge        Equipment Recommendations  None recommended by PT    Recommendations for Other Services    Frequency Min 5X/week   Plan Discharge plan remains appropriate;Frequency remains appropriate    Precautions / Restrictions Precautions Precautions: Back Precaution Comments: Patient able to recall all precautions Required Braces or Orthoses: Spinal Brace Spinal Brace: Lumbar corset   Pertinent Vitals/Pain     Mobility  Bed Mobility Sit to Sidelying Right: 5: Supervision Details for Bed Mobility Assistance: Cues for safety and precautions Transfers Sit to Stand: 5: Supervision;With upper extremity assist;From chair/3-in-1 Stand to Sit: 5: Supervision;With upper extremity assist;To bed Ambulation/Gait Ambulation Distance (Feet): 1200 Feet Assistive device: None Ambulation/Gait Assistance Details: Patient with very mild unsteadiness no LOB. Patient slightly guarded with gait and decreased speed Gait Pattern: Step-through pattern Stairs: Yes Stairs Assistance: 4: Min assist Stairs Assistance Details (indicate cue type and reason): A for HHA.  Stair Management Technique: Forwards Number of Stairs: 1  (practiced twice)    Exercises     PT Diagnosis:    PT Problem List:   PT Treatment Interventions:     PT Goals Acute Rehab PT Goals PT Goal: Sit to Stand - Progress: Met PT Transfer Goal: Bed to Chair/Chair to Bed - Progress: Met PT Goal: Ambulate - Progress: Progressing toward goal PT Goal: Up/Down  Stairs - Progress: Progressing toward goal  Visit Information  Last PT Received On: 01/26/12 Assistance Needed: +1    Subjective Data      Cognition  Overall Cognitive Status: Appears within functional limits for tasks assessed/performed Arousal/Alertness: Awake/alert Orientation Level: Appears intact for tasks assessed Behavior During Session: [facility] for tasks performed    Balance     End of Session PT - End of Session Equipment Utilized During Treatment: Gait belt;Back brace Activity Tolerance: Patient tolerated treatment well Patient left: in bed;with call bell/phone within reach Nurse Communication: Mobility status   GP     Fredrich Birks 01/26/2012, 8:56 AM 01/26/2012 Fredrich Birks PTA [phone] pager [phone] office

## 2012-01-26 NOTE — Progress Notes (Signed)
Patient ID: EIZA CANNIFF, female   DOB: Apr 01, 1946, 66 y.o.   MRN: [id number] Subjective:  The patient is alert. She complains of back soreness.  Objective: Vital signs in last 24 hours: Temp:  [98.2 F (36.8 C)-98.7 F (37.1 C)] 98.7 F (37.1 C) (07/20 0303) Pulse Rate:  [89-97] 89  (07/20 0303) Resp:  [18] 18  (07/20 0303) BP: (110-123)/(61-63) 110/63 mmHg (07/20 0303) SpO2:  [95 %-100 %] 95 % (07/20 0303)  Intake/Output from previous day: 07/19 0701 - 07/20 0700 In: -  Out: 2465 [Urine:2300; Drains:165] Intake/Output this shift:    Physical exam the patient is alert and oriented. Her lower extremity strength is grossly normal. I removed her Hemovac drain today.  Lab Results:  [facility] 01/23/12 1637  WBC 11.4*  HGB 10.5*  HCT 31.1*  PLT 141*   BMET  Basename 01/23/12 1637  NA 139  K 3.2*  CL 105  CO2 24  GLUCOSE 166*  BUN 8  CREATININE 0.52  CALCIUM 7.4*    Studies/Results: No results found.  Assessment/Plan: Postop day #3: I removed the patient's Hemovac drain. We will remove her Foley catheter and mobilize her with PT and OT.  LOS: 3 days     Deadra Diggins D 01/26/2012, 10:08 AM

## 2012-01-27 MED ORDER — BISACODYL 10 MG RE SUPP
10.0000 mg | Freq: Every day | RECTAL | Status: DC | PRN
  Administered 2012-01-27: 10 mg via RECTAL
  Filled 2012-01-27: qty 1

## 2012-01-27 NOTE — Progress Notes (Signed)
Physical Therapy Treatment Patient Details Name: Joan [name] MRN: [id number] DOB: 1945-10-07 Today's Date: 01/27/2012 Time: 0865-7846 PT Time Calculation (min): 21 min  PT Assessment / Plan / Recommendation Comments on Treatment Session  Pt progressing well although still limited by pain. Pt complained of not sleeping last night and having trouble going to the bathroom causing increased fatigue and weakness ambulating. Will continue per plan.    Follow Up Recommendations  Home health PT;Supervision for mobility/OOB    Barriers to Discharge        Equipment Recommendations  None recommended by PT    Recommendations for Other Services    Frequency Min 5X/week   Plan Discharge plan remains appropriate;Frequency remains appropriate    Precautions / Restrictions Precautions Precautions: Back Precaution Comments: Patient able to recall all precautions Required Braces or Orthoses: Spinal Brace Spinal Brace: Lumbar corset       Mobility  Bed Mobility Bed Mobility: Right Sidelying to Sit;Rolling Right;Sitting - Scoot to Edge of Bed Rolling Right: 5: Supervision;With rail Right Sidelying to Sit: 5: Supervision;With rails;HOB elevated Sitting - Scoot to Edge of Bed: 6: Modified independent (Device/Increase time) Details for Bed Mobility Assistance: VC for proper sequencing. No physical assist needed Transfers Transfers: Sit to Stand;Stand to Sit Sit to Stand: 5: Supervision;With upper extremity assist;From bed Stand to Sit: 5: Supervision;With upper extremity assist;To bed Details for Transfer Assistance: VC for proper sequencing and safety upon standing. Supervision secondary to decreased stability Ambulation/Gait Ambulation/Gait Assistance: 4: Min assist Ambulation Distance (Feet): 900 Feet Assistive device: None Ambulation/Gait Assistance Details: Min assist for stability as pt with unsteadiness with R lean during ambulation.  Gait Pattern: Step-through pattern;Lateral  trunk lean to right Gait velocity: decreased gait speed Stairs: Yes Stairs Assistance: 4: Min guard Stairs Assistance Details (indicate cue type and reason): VC for proper sequencing. Minguard for support, pt near supervision level Stair Management Technique: Forwards Number of Stairs: 3       PT Goals Acute Rehab PT Goals PT Goal: Supine/Side to Sit - Progress: Met PT Goal: Sit to Stand - Progress: Met PT Transfer Goal: Bed to Chair/Chair to Bed - Progress: Met PT Goal: Ambulate - Progress: Progressing toward goal PT Goal: Up/Down Stairs - Progress: Progressing toward goal  Visit Information  Last PT Received On: 01/27/12 Assistance Needed: +1    Subjective Data      Cognition  Overall Cognitive Status: Appears within functional limits for tasks assessed/performed Arousal/Alertness: Awake/alert Orientation Level: Appears intact for tasks assessed Behavior During Session: [facility] for tasks performed       End of Session PT - End of Session Equipment Utilized During Treatment: Gait belt;Back brace Activity Tolerance: Patient tolerated treatment well Patient left: in bed;with call bell/phone within reach Nurse Communication: Mobility status     Milana Kidney 01/27/2012, 9:20 AM  01/27/2012 Milana Kidney DPT PAGER: [phone] OFFICE: [phone]

## 2012-01-27 NOTE — Progress Notes (Signed)
Patient ID: Joan [name], female   DOB: 18-Sep-1945, 66 y.o.   MRN: [id number] Subjective:  The patient is alert. She complains of back pain. She says is at times out of control.  Objective: Vital signs in last 24 hours: Temp:  [98 F (36.7 C)-99.1 F (37.3 C)] 98 F (36.7 C) (07/21 0610) Pulse Rate:  [84-99] 85  (07/21 0610) Resp:  [17-20] 20  (07/21 0610) BP: (101-132)/(52-78) 130/72 mmHg (07/21 0610) SpO2:  [97 %-100 %] 98 % (07/21 0610)  Intake/Output from previous day: 07/20 0701 - 07/21 0700 In: -  Out: 1801 [Urine:1801] Intake/Output this shift: Total I/O In: 240 [P.O.:240] Out: 2 [Urine:1; Stool:1]  Physical exam the patient is alert and oriented. Her strength is normal.  Lab Results: No results found for this basename: WBC:2,HGB:2,HCT:2,PLT:2 in the last 72 hours BMET No results found for this basename: NA:2,K:2,CL:2,CO2:2,GLUCOSE:2,BUN:2,CREATININE:2,CALCIUM:2 in the last 72 hours  Studies/Results: No results found.  Assessment/Plan: Postop day 4: The patient is having some pain management issues. I encouraged her to ask for her when necessary pain medications.  LOS: 4 days     Fisher Hargadon D 01/27/2012, 9:40 AM

## 2012-01-28 MED ORDER — FENTANYL 25 MCG/HR TD PT72
50.0000 ug | MEDICATED_PATCH | TRANSDERMAL | Status: DC
  Administered 2012-01-28: 50 ug via TRANSDERMAL
  Filled 2012-01-28: qty 2

## 2012-01-28 NOTE — Progress Notes (Signed)
01/28/2012 Fredrich Birks PTA [phone] pager [phone] office

## 2012-01-28 NOTE — Progress Notes (Signed)
Subjective: Patient reports She's feeling better now the pain patches on those in her back she is having a little bit into her groin and anterior quad her preoperative leg pain is better and gone  Objective: Vital signs in last 24 hours: Temp:  [97.8 F (36.6 C)-98.2 F (36.8 C)] 97.8 F (36.6 C) (07/22 1000) Pulse Rate:  [87-89] 87  (07/22 1000) Resp:  [17-18] 17  (07/22 1000) BP: (128-135)/(68-76) 128/68 mmHg (07/22 1000) SpO2:  [99 %-100 %] 99 % (07/22 1000)  Intake/Output from previous day: 07/21 0701 - 07/22 0700 In: 240 [P.O.:240] Out: 2 [Urine:1; Stool:1] Intake/Output this shift: Total I/O In: 98 [I.V.:98] Out: -   Strength is 5 out of 5 wound is clean and dry  Lab Results: No results found for this basename: WBC:2,HGB:2,HCT:2,PLT:2 in the last 72 hours BMET No results found for this basename: NA:2,K:2,CL:2,CO2:2,GLUCOSE:2,BUN:2,CREATININE:2,CALCIUM:2 in the last 72 hours  Studies/Results: No results found.  Assessment/Plan: Continue mobilization with physical and occupational therapy continued and a patch seems to work better for  LOS: 5 days     Jefrey Raburn P 01/28/2012, 2:59 PM

## 2012-01-28 NOTE — Progress Notes (Signed)
Physical Therapy Treatment Patient Details Name: Joan [name] MRN: [id number] DOB: 01/26/1946 Today's Date: 01/28/2012 Time: 1478-2956 PT Time Calculation (min): 15 min  PT Assessment / Plan / Recommendation Comments on Treatment Session  Pt treatment today limited by pain.  Nurse was aware.  Pt's ambulation distance was limited today due to pain and weakness in LE.  Pt will benefit from continued PT to increase endurance and increase functional indepedencel  Continue with POC.     Follow Up Recommendations  Home health PT;Supervision for mobility/OOB    Barriers to Discharge        Equipment Recommendations  None recommended by PT    Recommendations for Other Services    Frequency Min 5X/week   Plan Discharge plan remains appropriate;Frequency remains appropriate    Precautions / Restrictions Precautions Precautions: Back Precaution Comments: Pt able to recall all precautions Required Braces or Orthoses: Spinal Brace Spinal Brace: Lumbar corset;Applied in sitting position Restrictions Weight Bearing Restrictions: No   Pertinent Vitals/Pain 8/10 back and thighs    Mobility  Bed Mobility Bed Mobility: Not assessed Transfers Stand to Sit: 5: Supervision;To chair/3-in-1;Without upper extremity assist;With armrests Details for Transfer Assistance: Supervision for safety Ambulation/Gait Ambulation/Gait Assistance: 4: Min assist Ambulation Distance (Feet): 80 Feet Assistive device: 1 person hand held assist Ambulation/Gait Assistance Details: Min assist for stability. Pt unsteady when ambulating.  Gait Pattern: Decreased stride length;Step-through pattern;Lateral trunk lean to right Gait velocity: decreased gait speed Stairs: No    Exercises     PT Diagnosis:    PT Problem List:   PT Treatment Interventions:     PT Goals Acute Rehab PT Goals PT Goal: Sit to Stand - Progress: Met PT Goal: Ambulate - Progress: Progressing toward goal  Visit Information  Last PT  Received On: 01/28/12 Assistance Needed: +1    Subjective Data  Subjective: "It is hurting all down my legs."   Cognition  Overall Cognitive Status: Appears within functional limits for tasks assessed/performed Arousal/Alertness: Awake/alert Orientation Level: Appears intact for tasks assessed Behavior During Session: [facility] for tasks performed    Balance     End of Session PT - End of Session Equipment Utilized During Treatment: Gait belt;Back brace Activity Tolerance: Patient limited by pain Patient left: in chair;with call bell/phone within reach;with family/visitor present Nurse Communication: Mobility status   GP     Vernesha Talbot, SPTA 01/28/2012, 10:44 AM

## 2012-01-29 MED ORDER — FENTANYL 25 MCG/HR TD PT72: 1.0000 | MEDICATED_PATCH | TRANSDERMAL | Status: AC

## 2012-01-29 NOTE — Progress Notes (Signed)
Seen and agreed 01/29/2012 Sherlene Rickel Elizabeth PTA [phone] pager [phone] office    

## 2012-01-29 NOTE — Care Management Note (Signed)
    Page 1 of 2   01/29/2012     11:30:43 AM   CARE MANAGEMENT NOTE 01/29/2012  Patient:  Joan [name], Joan [name]   Account Number:  [id number]  Date Initiated:  01/24/2012  Documentation initiated by:  [facility]  Subjective/Objective Assessment:   Admitted postop T10-L4 fusion, hypotension postop.     Action/Plan:   PT eval- recommending HHPT  OT eval-recommending HHOT   Anticipated DC Date:  01/29/2012   Anticipated DC Plan:  HOME W HOME HEALTH SERVICES      DC Planning Services  CM consult      Choice offered to / List presented to:  C-1 Patient        HH arranged  HH-2 PT  HH-3 OT      [facility] agency  [facility]   Status of service:  Completed, signed off Medicare Important Message given?   (If response is "NO", the following Medicare IM given date fields will be blank) Date Medicare IM given:   Date Additional Medicare IM given:    Discharge Disposition:  HOME W HOME HEALTH SERVICES  Per UR Regulation:  Reviewed for med. necessity/level of care/duration of stay  If discussed at Long Length of Stay Meetings, dates discussed:    Comments:  01/29/12 Spoke with patient about HHC  for HHPT and HHOT, she chose [facility] from the [facility] list of [name] agencies. Contacted Abbe Bula at [facility] and set up HHPT and OT. No equipment needs identified. Jacquelynn Cree  RN, BSN, CCM   01/25/12 Tried to speak with patient about HHC but she was having increased pain after working with therapy and was unable to discuss d/c plans. Will continue to follow for d/c needs. Jacquelynn Cree RN, BSN, CCM

## 2012-01-29 NOTE — Progress Notes (Signed)
Subjective: Patient reports She still much. Pains are much better control she's angling and voiding spontaneously ready for discharge  Objective: Vital signs in last 24 hours: Temp:  [97.8 F (36.6 C)-99.3 F (37.4 C)] 98.6 F (37 C) (07/23 0539) Pulse Rate:  [87-93] 93  (07/23 0539) Resp:  [17-18] 18  (07/23 0539) BP: (111-138)/(61-76) 116/67 mmHg (07/23 0539) SpO2:  [97 %-99 %] 97 % (07/23 0539)  Intake/Output from previous day: 07/22 0701 - 07/23 0700 In: 98 [I.V.:98] Out: -  Intake/Output this shift:    strength 5 out of 5 wound is clean and dry  Lab Results: No results found for this basename: WBC:2,HGB:2,HCT:2,PLT:2 in the last 72 hours BMET No results found for this basename: NA:2,K:2,CL:2,CO2:2,GLUCOSE:2,BUN:2,CREATININE:2,CALCIUM:2 in the last 72 hours  Studies/Results: No results found.  Assessment/Plan: Discharged home scheduled followup approximately 2 weeks  LOS: 6 days     Aubreigh Fuerte P 01/29/2012, 8:19 AM

## 2012-01-29 NOTE — Progress Notes (Signed)
Pt discharged to home via wheelchair.  Discharged instructions were given including prescriptions.  Pt instructed to change fentanyl patches on 7/24. Pt stable.  No other needs at this time.  Harlan Stains [facility] 01/29/2012 12:16 PM

## 2012-01-29 NOTE — Progress Notes (Signed)
Physical Therapy Treatment Patient Details Name: Joan [name] MRN: [id number] DOB: 22-Aug-1945 Today's Date: 01/29/2012 Time: 7829-5621 PT Time Calculation (min): 16 min  PT Assessment / Plan / Recommendation Comments on Treatment Session  Pt was able to tolerate treatment better today as pain was diminished.  Pt was able to increase ambulation distance and ascend/descend stairs.  Pt has sway (esp. to R) when walking, but does not appear to affect balance.  Pt would benefit from continued PT to increase strength and endurance.  Continue with POC.     Follow Up Recommendations  Home health PT;Supervision for mobility/OOB    Barriers to Discharge        Equipment Recommendations  None recommended by PT    Recommendations for Other Services    Frequency Min 5X/week   Plan Discharge plan remains appropriate;Frequency remains appropriate    Precautions / Restrictions Precautions Precautions: Back Precaution Booklet Issued: No Precaution Comments: Pt able to recall and demo all precautions. Required Braces or Orthoses: Spinal Brace Spinal Brace: Applied in sitting position;Lumbar corset Restrictions Weight Bearing Restrictions: No   Pertinent Vitals/Pain 7/10 back pain    Mobility  Bed Mobility Bed Mobility: Not assessed Details for Bed Mobility Assistance: VC for proper sequencing Transfers Sit to Stand: With upper extremity assist;From chair/3-in-1;6: Modified independent (Device/Increase time) Stand to Sit: With upper extremity assist;With armrests;To chair/3-in-1;6: Modified independent (Device/Increase time) Details for Transfer Assistance: Pt able to transfer independently with increased time Ambulation/Gait Ambulation/Gait Assistance: 4: Min guard Ambulation Distance (Feet): 1200 Feet Assistive device: None Ambulation/Gait Assistance Details: Min guard for increased sway with gait, doesn't appear to cause any LOB. Gait Pattern: Decreased stride length;Lateral trunk  lean to right;Step-through pattern Gait velocity: WFL Stairs: Yes Stairs Assistance: 4: Min guard Stairs Assistance Details (indicate cue type and reason): Min guard for safety/IV, pt near supervision level Stair Management Technique: Alternating pattern;Forwards;No rails Number of Stairs: 5     Exercises     PT Diagnosis:    PT Problem List:   PT Treatment Interventions:     PT Goals    Visit Information  Last PT Received On: 01/29/12 Assistance Needed: +1    Subjective Data  Subjective: "I am doing much better now."   Cognition  Overall Cognitive Status: Appears within functional limits for tasks assessed/performed Arousal/Alertness: Awake/alert Orientation Level: Appears intact for tasks assessed Behavior During Session: [facility] for tasks performed    Balance     End of Session PT - End of Session Equipment Utilized During Treatment: Gait belt;Back brace Activity Tolerance: Patient tolerated treatment well Patient left: in chair;with call bell/phone within reach Nurse Communication: Mobility status   GP     Gay Rape, SPTA 01/29/2012, 9:02 AM

## 2012-01-29 NOTE — Discharge Summary (Signed)
Physician Discharge Summary  Patient ID: DARRIN APODACA MRN: [id number] DOB/AGE: 67-Apr-1947 67 y.o.  Admit date: 01/23/2012 Discharge date: 01/29/2012  Admission Diagnoses: Degenerative scoliosis lumbar spinal stenosis  Discharge Diagnoses: Same Active Problems:  * No active hospital problems. *    Discharged Condition: good  Hospital Course: Patient is admitted hospital underwent a T10-L3 stabilization procedure postoperatively patient did very well had a significant issue for pain control was monitored the ICU eventually transitioned to the floor while she's the floor she continued to continue with physical therapy and continue to improve progressively she is having sent essentially no lumbar radicular pain she had some achiness and soreness in her quads and some back pain this became under good control with fentanyl patch and patient be stable and be discharged home scheduled followup approximately 2 weeks. At time discharge patient was afebrile tolerating regular diet no signs are stable.  Consults: Significant Diagnostic Studies: Treatments: T10-L3 lumbar fusion Discharge Exam: Blood pressure 116/67, pulse 93, temperature 98.6 F (37 C), temperature source Oral, resp. rate 18, height 5\' 2"  (1.575 m), weight 65 kg (143 lb 4.8 oz), SpO2 97.00%. Strength out of 5 wound clean dry  Disposition: Home  Discharge Orders    Future Appointments: Provider: Department: Dept Phone: Center:   07/11/2012 1:15 PM Doe-Hyun Sherran Needs, DO [facility] [phone] [facility]     Medication List  As of 01/29/2012  8:22 AM   TAKE these medications         bisacodyl 5 MG EC tablet   Commonly known as: DULCOLAX   Take 5 mg by mouth at bedtime.      carisoprodol 350 MG tablet   Commonly known as: SOMA   Take 350 mg by mouth every 6 (six) hours as needed. For muscle spasms      clonazePAM 1 MG tablet   Commonly known as: KLONOPIN   Take 1 mg by mouth at bedtime as needed. For sleep     estradiol 0.5 MG tablet   Commonly known as: ESTRACE   Take 1 tablet by mouth daily.      fentaNYL 25 MCG/HR   Commonly known as: DURAGESIC - dosed mcg/hr   Place 1 patch (25 mcg total) onto the skin every 3 (three) days.      Fish Oil 1200 MG Caps   Take 2,400 mg by mouth daily.      GLUCOSAMINE PO   Take 2 tablets by mouth daily.      hydrochlorothiazide 25 MG tablet   Commonly known as: HYDRODIURIL   Take 1 tablet (25 mg total) by mouth daily.      HYDROcodone-acetaminophen 10-325 MG per tablet   Commonly known as: NORCO   Take 1 tablet by mouth every 4 (four) hours.      levothyroxine 100 MCG tablet   Commonly known as: SYNTHROID, LEVOTHROID   Take 1 tablet (100 mcg total) by mouth daily.      nicotine 14 mg/24hr patch   Commonly known as: NICODERM CQ - dosed in mg/24 hours   Place 1 patch onto the skin daily.      omeprazole 20 MG capsule   Commonly known as: PRILOSEC   Take 1 capsule (20 mg total) by mouth 2 (two) times daily.      potassium chloride SA 20 MEQ tablet   Commonly known as: K-DUR,KLOR-CON   Take 1 tablet (20 mEq total) by mouth 2 (two) times daily.      simvastatin 20 MG  tablet   Commonly known as: ZOCOR   Take 1 tablet (20 mg total) by mouth at bedtime.      Vitamin B-12 5000 MCG Subl   Place 1 tablet under the tongue daily.             Signed: Aneliese Beaudry P 01/29/2012, 8:22 AM

## 2012-01-31 MED FILL — Hydromorphone HCl Preservative Free (PF) Inj 1 MG/ML: INTRAMUSCULAR | Qty: 1 | Status: AC

## 2012-01-31 MED FILL — Potassium Chloride Microencapsulated Crys ER Tab 20 mEq: ORAL | Qty: 2 | Status: AC

## 2012-01-31 MED FILL — Cefazolin in D5W Inj 1 GM/50ML: INTRAVENOUS | Qty: 50 | Status: AC

## 2012-01-31 MED FILL — Hydrocodone-Acetaminophen Tab 5-325 MG: ORAL | Qty: 1 | Status: AC

## 2012-01-31 MED FILL — Carisoprodol Tab 350 MG: ORAL | Qty: 1 | Status: AC

## 2012-01-31 MED FILL — Simvastatin Tab 20 MG: ORAL | Qty: 1 | Status: AC

## 2012-01-31 MED FILL — Clonazepam Tab 1 MG: ORAL | Qty: 1 | Status: AC

## 2012-03-18 ENCOUNTER — Other Ambulatory Visit: Payer: Self-pay | Admitting: Obstetrics and Gynecology

## 2012-03-18 DIAGNOSIS — Z803 Family history of malignant neoplasm of breast: Secondary | ICD-10-CM

## 2012-03-18 DIAGNOSIS — Z1231 Encounter for screening mammogram for malignant neoplasm of breast: Secondary | ICD-10-CM

## 2012-03-28 ENCOUNTER — Ambulatory Visit (INDEPENDENT_AMBULATORY_CARE_PROVIDER_SITE_OTHER): Payer: Medicare Other | Admitting: *Deleted

## 2012-03-28 ENCOUNTER — Ambulatory Visit (INDEPENDENT_AMBULATORY_CARE_PROVIDER_SITE_OTHER): Payer: Medicare Other

## 2012-03-28 DIAGNOSIS — Z23 Encounter for immunization: Secondary | ICD-10-CM

## 2012-04-04 ENCOUNTER — Ambulatory Visit
Admission: RE | Admit: 2012-04-04 | Discharge: 2012-04-04 | Disposition: A | Discharge status: Home / Self Care | Payer: Medicare Other | Source: Ambulatory Visit | Attending: Obstetrics and Gynecology | Admitting: Obstetrics and Gynecology

## 2012-04-04 DIAGNOSIS — Z803 Family history of malignant neoplasm of breast: Secondary | ICD-10-CM

## 2012-04-04 DIAGNOSIS — Z1231 Encounter for screening mammogram for malignant neoplasm of breast: Secondary | ICD-10-CM

## 2012-05-13 ENCOUNTER — Ambulatory Visit: Payer: Medicare Other | Attending: Neurosurgery | Admitting: Physical Therapy

## 2012-05-13 DIAGNOSIS — M545 Low back pain, unspecified: Secondary | ICD-10-CM | POA: Insufficient documentation

## 2012-05-13 DIAGNOSIS — R293 Abnormal posture: Secondary | ICD-10-CM | POA: Insufficient documentation

## 2012-05-13 DIAGNOSIS — M546 Pain in thoracic spine: Secondary | ICD-10-CM | POA: Insufficient documentation

## 2012-05-13 DIAGNOSIS — M2560 Stiffness of unspecified joint, not elsewhere classified: Secondary | ICD-10-CM | POA: Insufficient documentation

## 2012-05-13 DIAGNOSIS — IMO0001 Reserved for inherently not codable concepts without codable children: Secondary | ICD-10-CM | POA: Insufficient documentation

## 2012-05-19 ENCOUNTER — Other Ambulatory Visit: Payer: Self-pay | Admitting: Neurosurgery

## 2012-05-19 DIAGNOSIS — IMO0002 Reserved for concepts with insufficient information to code with codable children: Secondary | ICD-10-CM

## 2012-05-20 ENCOUNTER — Ambulatory Visit: Payer: Medicare Other | Admitting: Physical Therapy

## 2012-05-22 ENCOUNTER — Ambulatory Visit: Payer: Medicare Other | Admitting: Physical Therapy

## 2012-05-26 ENCOUNTER — Ambulatory Visit
Admission: RE | Admit: 2012-05-26 | Discharge: 2012-05-26 | Disposition: A | Discharge status: Home / Self Care | Payer: Medicare Other | Source: Ambulatory Visit | Attending: Neurosurgery | Admitting: Neurosurgery

## 2012-05-26 DIAGNOSIS — IMO0002 Reserved for concepts with insufficient information to code with codable children: Secondary | ICD-10-CM

## 2012-05-26 MED ORDER — GADOBENATE DIMEGLUMINE 529 MG/ML IV SOLN
12.0000 mL | Freq: Once | INTRAVENOUS | Status: AC | PRN
  Administered 2012-05-26: 12 mL via INTRAVENOUS

## 2012-05-27 ENCOUNTER — Ambulatory Visit: Payer: Medicare Other | Admitting: Physical Therapy

## 2012-05-29 ENCOUNTER — Ambulatory Visit: Payer: Medicare Other | Admitting: Physical Therapy

## 2012-06-10 ENCOUNTER — Ambulatory Visit: Payer: Medicare Other | Attending: Neurosurgery | Admitting: Physical Therapy

## 2012-06-10 DIAGNOSIS — M2560 Stiffness of unspecified joint, not elsewhere classified: Secondary | ICD-10-CM | POA: Insufficient documentation

## 2012-06-10 DIAGNOSIS — M546 Pain in thoracic spine: Secondary | ICD-10-CM | POA: Insufficient documentation

## 2012-06-10 DIAGNOSIS — M545 Low back pain, unspecified: Secondary | ICD-10-CM | POA: Insufficient documentation

## 2012-06-10 DIAGNOSIS — R293 Abnormal posture: Secondary | ICD-10-CM | POA: Insufficient documentation

## 2012-06-10 DIAGNOSIS — IMO0001 Reserved for inherently not codable concepts without codable children: Secondary | ICD-10-CM | POA: Insufficient documentation

## 2012-06-11 ENCOUNTER — Other Ambulatory Visit: Payer: Self-pay | Admitting: Neurosurgery

## 2012-06-11 DIAGNOSIS — M549 Dorsalgia, unspecified: Secondary | ICD-10-CM

## 2012-06-12 ENCOUNTER — Other Ambulatory Visit: Payer: Medicare Other

## 2012-06-12 ENCOUNTER — Ambulatory Visit: Payer: Medicare Other | Admitting: Physical Therapy

## 2012-06-16 ENCOUNTER — Inpatient Hospital Stay: Admission: RE | Admit: 2012-06-16 | Payer: Medicare Other | Source: Ambulatory Visit

## 2012-06-17 ENCOUNTER — Ambulatory Visit (INDEPENDENT_AMBULATORY_CARE_PROVIDER_SITE_OTHER): Payer: Medicare Other | Admitting: Internal Medicine

## 2012-06-17 ENCOUNTER — Encounter: Payer: Medicare Other | Admitting: Physical Therapy

## 2012-06-17 ENCOUNTER — Encounter: Payer: Self-pay | Admitting: Internal Medicine

## 2012-06-17 VITALS — BP 122/74 | Temp 99.1°F | Wt 126.0 lb

## 2012-06-17 DIAGNOSIS — J069 Acute upper respiratory infection, unspecified: Secondary | ICD-10-CM | POA: Insufficient documentation

## 2012-06-17 DIAGNOSIS — R509 Fever, unspecified: Secondary | ICD-10-CM

## 2012-06-17 LAB — POCT INFLUENZA A/B
Influenza A, POC: NEGATIVE
Influenza B, POC: NEGATIVE

## 2012-06-17 MED ORDER — OXYCODONE-ACETAMINOPHEN 5-325 MG PO TABS: 1.0000 | ORAL_TABLET | ORAL | Status: DC | PRN

## 2012-06-17 NOTE — Assessment & Plan Note (Signed)
66 year old white female with signs and symptoms of viral URI. I recommended symptomatic treatment.  Patient advised to call office if symptoms persist or worsen.

## 2012-06-17 NOTE — Patient Instructions (Addendum)
Hold simvastatin until you are feeling better. Drink plenty of fluids Take ibuprofen 400 mg every 12 hrs as needed for aches and pains (3-5 days) Please call our office if your symptoms do not improve or gets worse.

## 2012-06-17 NOTE — Progress Notes (Signed)
Subjective:    Patient ID: Joan [name], female    DOB: February 12, 1946, 66 y.o.   MRN: [id number]  HPI  66 year old white female complains of flulike symptoms. Her symptoms started on Saturday with mild sore throat.  Then she started feeling achy all over. She has mild nonproductive cough. She reports fever of 101.5 this morning. She denies any gastrointestinal symptoms. She denies any shortness of breath.  Review of Systems See history of present illness  Past Medical History  Diagnosis Date  . Chronic low back pain     followed by Dr Vear Clock pain mgt  . Hypertension   . Hyperlipemia   . Osteoarthritis of left knee     advanced  . Tobacco abuse   . Hypothyroidism   . History of cardiac arrhythmia     cardiologist- Traci Turner  . Esophageal stricture   . Thyroid nodule   . Colon polyp   . Sleep apnea     10-15 YRS AGO DOES NOT USE MACH  . Dysrhythmia     IRREG     . GERD (gastroesophageal reflux disease)     History   Social History  . Marital Status: Divorced    Spouse Name: N/A    Number of Children: N/A  . Years of Education: N/A   Occupational History  . Not on file.   Social History Main Topics  . Smoking status: Current Every Day Smoker  . Smokeless tobacco: Not on file     Comment: 1 ppd - 20 years  . Alcohol Use: No  . Drug Use: No  . Sexually Active: Not on file   Other Topics Concern  . Not on file   Social History Narrative   DivorcedCurrent Smoker  1 ppd -  20 yrs   Alcohol use-no    [employer] - laid off     Past Surgical History  Procedure Date  . Other surgical history     4 Lumbar surgeries  . Lumbar fusion     and rods  . Cesarean section   . Ankle surgery     left ankle 1995  . Infusion pump implantation     history of implantablet morphine pump  . Partial hysterectomy   . Knee surgery 1991  . Elbow surgery 1990's  . Thyroidectomy     2012  . Cervical spine surgery     2012  . Foot surgery     RT FOOT  SPUR REMOVED 2012   . Shoulder surgery     09/2011  LEFT  . Abdominal hysterectomy   . Back surgery     BACK X 4     Family History  Problem Relation Age of Onset  . Pancreatic cancer Father     deceased age 88  . Cancer Father   . Diabetes type II Mother     deceased age 48  . Hypertension Mother   . Coronary artery disease Mother   . Diabetes Mother   . Cancer Mother     Thyroid and Skin  . Diabetes Sister   . Hypertension Sister   . Cancer Sister     Breast  . Other Neg Hx     No family history of  colon cancer  . Hypertension Brother     Allergies  Allergen Reactions  . Gabapentin     REACTION: Itching Ask patient to clarify "Neurogan" on history form dated 09/25/10.  Marland Kitchen Hydrochlorothiazide W-Triamterene  REACTION: rash and itching    Current Outpatient Prescriptions on File Prior to Visit  Medication Sig Dispense Refill  . bisacodyl (DULCOLAX) 5 MG EC tablet Take 5 mg by mouth at bedtime.      . carisoprodol (SOMA) 350 MG tablet Take 350 mg by mouth every 6 (six) hours as needed. For muscle spasms      . clonazePAM (KLONOPIN) 1 MG tablet Take 1 mg by mouth at bedtime as needed. For sleep      . Cyanocobalamin (VITAMIN B-12) 5000 MCG SUBL Place 1 tablet under the tongue daily.      Marland Kitchen estradiol (ESTRACE) 0.5 MG tablet Take 1 tablet by mouth daily.      Marland Kitchen GLUCOSAMINE PO Take 2 tablets by mouth daily.      . hydrochlorothiazide (HYDRODIURIL) 25 MG tablet Take 1 tablet (25 mg total) by mouth daily.  90 tablet  1  . HYDROcodone-acetaminophen (NORCO) 10-325 MG per tablet Take 1 tablet by mouth every 4 (four) hours.       Marland Kitchen levothyroxine (SYNTHROID, LEVOTHROID) 100 MCG tablet Take 1 tablet (100 mcg total) by mouth daily.  90 tablet  1  . nicotine (NICODERM CQ - DOSED IN MG/24 HOURS) 14 mg/24hr patch Place 1 patch onto the skin daily.      . Omega-3 Fatty Acids (FISH OIL) 1200 MG CAPS Take 2,400 mg by mouth daily.      Marland Kitchen omeprazole (PRILOSEC) 20 MG capsule Take 1  capsule (20 mg total) by mouth 2 (two) times daily.  180 capsule  1  . potassium chloride SA (KLOR-CON M20) 20 MEQ tablet Take 1 tablet (20 mEq total) by mouth 2 (two) times daily.  180 tablet  2  . simvastatin (ZOCOR) 20 MG tablet Take 1 tablet (20 mg total) by mouth at bedtime.  90 tablet  1    BP 122/74  Temp 99.1 F (37.3 C) (Oral)  Wt 126 lb (57.153 kg)       Objective:   Physical Exam  Constitutional: She appears well-developed and well-nourished. No distress.  HENT:  Head: Normocephalic and atraumatic.  Left Ear: External ear normal.  Mouth/Throat: No oropharyngeal exudate.       Right tympanic membranes retracted slightly erythematous Oropharyngeal erythema  Neck: Neck supple.       No neck tenderness  Cardiovascular: Normal rate and normal heart sounds.   No murmur heard. Pulmonary/Chest: Effort normal and breath sounds normal. She has no wheezes.          Assessment & Plan:

## 2012-06-20 ENCOUNTER — Telehealth: Payer: Self-pay | Admitting: Internal Medicine

## 2012-06-20 ENCOUNTER — Other Ambulatory Visit: Payer: Medicare Other

## 2012-06-20 MED ORDER — MECLIZINE HCL 12.5 MG PO TABS: 12.5000 mg | ORAL_TABLET | Freq: Three times a day (TID) | ORAL | Status: DC | PRN

## 2012-06-20 NOTE — Telephone Encounter (Signed)
Pt does not want to be seen cause she was just in here on 12/10.  She forgot to mention to Dr Artist Pais about the dizziness.  She just wants something called in.  Ok per Dr Artist Pais call in meclizine 12.5 mg tid #30 no refills, pt aware, rx sent in electronically to [facility] per pt request

## 2012-06-20 NOTE — Telephone Encounter (Signed)
error 

## 2012-06-20 NOTE — Telephone Encounter (Signed)
[facility] Triage Call Report Triage Record Num: [id number] Operator: Peri Jefferson Patient Name: Joan [name] Call Date & Time: 06/20/2012 12:58:04PM Patient Phone: [phone] PCP: Thomos Lemons Patient Gender: Female PCP Fax : [phone] Patient DOB: 1946-04-07 [facility] Name: [facility]  Reason for Call: Caller: [name]/Patient; PCP: Bonita Quin, Doe-Hyun Molly Maduro); CB#: [phone]; Call regarding Balance issues; Seen in office 06/17/12 with viral diagnosis. Congestion in head and nose. Complaints of vertigo symptoms as of 06/18/12. States balance is off. Temp this am was 100. Percocet is helping with headaches. Per Dizziness or Vertigo Protocol all emergent symptoms ruled out with exception of "Symptoms worsen with movement of head or looking up and not previously evaluated." Advised to see provider within 24 hrs. States she does not want to have to come in but will if she has too. Advised she may speak with office in am. Home care advice given. Triage and Note by: Maryfrances Bunnell, RN  Protocol(s) Used: Dizziness or Vertigo Recommended Outcome per Protocol: See Provider within 24 hours Reason for Outcome: Symptoms worsen with movement of head or looking up AND not previously evaluated

## 2012-06-20 NOTE — Telephone Encounter (Signed)
Call pt - if she feels worse, she can be seen at Saturday clinic

## 2012-06-23 ENCOUNTER — Ambulatory Visit: Payer: Medicare Other | Admitting: Physical Therapy

## 2012-06-27 ENCOUNTER — Encounter: Payer: Medicare Other | Admitting: Physical Therapy

## 2012-07-04 ENCOUNTER — Other Ambulatory Visit: Payer: Medicare Other

## 2012-07-11 ENCOUNTER — Ambulatory Visit: Payer: Medicare Other | Admitting: Internal Medicine

## 2012-07-11 ENCOUNTER — Ambulatory Visit (INDEPENDENT_AMBULATORY_CARE_PROVIDER_SITE_OTHER): Payer: Medicare Other | Admitting: Internal Medicine

## 2012-07-11 ENCOUNTER — Encounter: Payer: Self-pay | Admitting: Internal Medicine

## 2012-07-11 VITALS — BP 102/64 | HR 75 | Temp 98.0°F | Wt 121.0 lb

## 2012-07-11 DIAGNOSIS — J40 Bronchitis, not specified as acute or chronic: Secondary | ICD-10-CM

## 2012-07-11 DIAGNOSIS — I10 Essential (primary) hypertension: Secondary | ICD-10-CM

## 2012-07-11 DIAGNOSIS — D649 Anemia, unspecified: Secondary | ICD-10-CM

## 2012-07-11 DIAGNOSIS — E039 Hypothyroidism, unspecified: Secondary | ICD-10-CM

## 2012-07-11 LAB — CBC WITH DIFFERENTIAL/PLATELET
Basophils Absolute: 0 10*3/uL (ref 0.0–0.1)
Basophils Relative: 0.3 % (ref 0.0–3.0)
Eosinophils Absolute: 0 10*3/uL (ref 0.0–0.7)
Eosinophils Relative: 0.6 % (ref 0.0–5.0)
HCT: 35 % — ABNORMAL LOW (ref 36.0–46.0)
Hemoglobin: 11.8 g/dL — ABNORMAL LOW (ref 12.0–15.0)
Lymphocytes Relative: 24.6 % (ref 12.0–46.0)
Lymphs Abs: 1.7 10*3/uL (ref 0.7–4.0)
MCHC: 33.8 g/dL (ref 30.0–36.0)
MCV: 88.4 fl (ref 78.0–100.0)
Monocytes Absolute: 0.5 10*3/uL (ref 0.1–1.0)
Monocytes Relative: 6.6 % (ref 3.0–12.0)
Neutro Abs: 4.7 10*3/uL (ref 1.4–7.7)
Neutrophils Relative %: 67.9 % (ref 43.0–77.0)
Platelets: 220 10*3/uL (ref 150.0–400.0)
RBC: 3.96 Mil/uL (ref 3.87–5.11)
RDW: 14.6 % (ref 11.5–14.6)
WBC: 6.9 10*3/uL (ref 4.5–10.5)

## 2012-07-11 LAB — BASIC METABOLIC PANEL
BUN: 13 mg/dL (ref 6–23)
CO2: 31 mEq/L (ref 19–32)
Calcium: 8.7 mg/dL (ref 8.4–10.5)
Chloride: 102 mEq/L (ref 96–112)
Creatinine, Ser: 0.6 mg/dL (ref 0.4–1.2)
GFR: 103.77 mL/min (ref >60.00)
Glucose, Bld: 93 mg/dL (ref 70–99)
Potassium: 3.6 mEq/L (ref 3.5–5.1)
Sodium: 140 mEq/L (ref 135–145)

## 2012-07-11 LAB — TSH: TSH: 0.73 u[IU]/mL (ref 0.35–5.50)

## 2012-07-11 MED ORDER — DOXYCYCLINE HYCLATE 100 MG PO TABS: 100.0000 mg | ORAL_TABLET | Freq: Two times a day (BID) | ORAL | Status: DC

## 2012-07-11 MED ORDER — MOMETASONE FURO-FORMOTEROL FUM 100-5 MCG/ACT IN AERO: 2.0000 | INHALATION_SPRAY | Freq: Two times a day (BID) | RESPIRATORY_TRACT | Status: DC

## 2012-07-11 NOTE — Patient Instructions (Addendum)
Please try to quit smoking.  Use nicotine patches. Please call our office if your symptoms do not improve or gets worse. Please complete the following lab tests before your next follow up appointment: BMET - 401.9 FLP, LFTs - 272.4 CBCD - 285.9

## 2012-07-11 NOTE — Progress Notes (Signed)
Subjective:    Patient ID: Joan [name], female    DOB: Jun 24, 1946, 67 y.o.   MRN: [id number]  HPI  67 year old white female with history of tobacco use, hypothyroidism and chronic low back pain complains of cough congestion for last 6-7 days. Patient reports cough is intermittently productive of whitish to yellowish sputum. She has mild heaviness sensation in her chest. She denies shortness of breath. She denies fever or chills.  Patient continues to have issues with low back pain. She underwent lumbar fusion surgery. Repeat MRI showed bulging disc at low level of surgery. She plans to have epidural injection in late January.  She is currently smoking half pack per day Review of Systems No significant weight change  Past Medical History  Diagnosis Date  . Chronic low back pain     followed by Dr Vear Clock pain mgt  . Hypertension   . Hyperlipemia   . Osteoarthritis of left knee     advanced  . Tobacco abuse   . Hypothyroidism   . History of cardiac arrhythmia     cardiologist- Traci Turner  . Esophageal stricture   . Thyroid nodule   . Colon polyp   . Sleep apnea     10-15 YRS AGO DOES NOT USE MACH  . Dysrhythmia     IRREG     . GERD (gastroesophageal reflux disease)     History   Social History  . Marital Status: Divorced    Spouse Name: N/A    Number of Children: N/A  . Years of Education: N/A   Occupational History  . Not on file.   Social History Main Topics  . Smoking status: Current Every Day Smoker  . Smokeless tobacco: Not on file     Comment: 1 ppd - 20 years  . Alcohol Use: No  . Drug Use: No  . Sexually Active: Not on file   Other Topics Concern  . Not on file   Social History Narrative   DivorcedCurrent Smoker  1 ppd -  20 yrs   Alcohol use-no    [employer] - laid off     Past Surgical History  Procedure Date  . Other surgical history     4 Lumbar surgeries  . Lumbar fusion     and rods  . Cesarean section   . Ankle  surgery     left ankle 1995  . Infusion pump implantation     history of implantablet morphine pump  . Partial hysterectomy   . Knee surgery 1991  . Elbow surgery 1990's  . Thyroidectomy     2012  . Cervical spine surgery     2012  . Foot surgery     RT FOOT SPUR REMOVED 2012   . Shoulder surgery     09/2011  LEFT  . Abdominal hysterectomy   . Back surgery     BACK X 4     Family History  Problem Relation Age of Onset  . Pancreatic cancer Father     deceased age 28  . Cancer Father   . Diabetes type II Mother     deceased age 11  . Hypertension Mother   . Coronary artery disease Mother   . Diabetes Mother   . Cancer Mother     Thyroid and Skin  . Diabetes Sister   . Hypertension Sister   . Cancer Sister     Breast  . Other Neg Hx  No family history of  colon cancer  . Hypertension Brother     Allergies  Allergen Reactions  . Gabapentin     REACTION: Itching Ask patient to clarify "Neurogan" on history form dated 09/25/10.  Marland Kitchen Hydrochlorothiazide W-Triamterene     REACTION: rash and itching    Current Outpatient Prescriptions on File Prior to Visit  Medication Sig Dispense Refill  . bisacodyl (DULCOLAX) 5 MG EC tablet Take 5 mg by mouth at bedtime.      . carisoprodol (SOMA) 350 MG tablet Take 350 mg by mouth every 6 (six) hours as needed. For muscle spasms      . clonazePAM (KLONOPIN) 1 MG tablet Take 1 mg by mouth at bedtime as needed. For sleep      . Cyanocobalamin (VITAMIN B-12) 5000 MCG SUBL Place 1 tablet under the tongue daily.      Marland Kitchen estradiol (ESTRACE) 0.5 MG tablet Take 1 tablet by mouth daily.      Marland Kitchen GLUCOSAMINE PO Take 2 tablets by mouth daily.      . hydrochlorothiazide (HYDRODIURIL) 25 MG tablet Take 1 tablet (25 mg total) by mouth daily.  90 tablet  1  . HYDROcodone-acetaminophen (NORCO) 10-325 MG per tablet Take 1 tablet by mouth every 4 (four) hours.       Marland Kitchen levothyroxine (SYNTHROID, LEVOTHROID) 100 MCG tablet Take 1 tablet (100 mcg total)  by mouth daily.  90 tablet  1  . Omega-3 Fatty Acids (FISH OIL) 1200 MG CAPS Take 2,400 mg by mouth daily.      Marland Kitchen omeprazole (PRILOSEC) 20 MG capsule Take 1 capsule (20 mg total) by mouth 2 (two) times daily.  180 capsule  1  . potassium chloride SA (KLOR-CON M20) 20 MEQ tablet Take 1 tablet (20 mEq total) by mouth 2 (two) times daily.  180 tablet  2  . simvastatin (ZOCOR) 20 MG tablet Take 1 tablet (20 mg total) by mouth at bedtime.  90 tablet  1  . mometasone-formoterol (DULERA) 100-5 MCG/ACT AERO Inhale 2 puffs into the lungs 2 (two) times daily.  8.8 g  0    BP 102/64  Pulse 75  Temp 98 F (36.7 C) (Oral)  Wt 121 lb (54.885 kg)  SpO2 91%       Objective:   Physical Exam  Constitutional: She is oriented to person, place, and time. She appears well-developed and well-nourished.  HENT:  Head: Normocephalic and atraumatic.  Right Ear: External ear normal.  Left Ear: External ear normal.  Mouth/Throat: No oropharyngeal exudate.       Oropharyngeal erythema  Eyes: EOM are normal. Pupils are equal, round, and reactive to light.  Neck: Neck supple.       No neck tenderness No carotid bruit  Cardiovascular: Normal rate, regular rhythm and normal heart sounds.   Pulmonary/Chest: Effort normal.       Scattered coarse breath sounds  Abdominal: Soft. Bowel sounds are normal.  Musculoskeletal: She exhibits no edema.  Neurological: She is alert and oriented to person, place, and time. No cranial nerve deficit.  Psychiatric: She has a normal mood and affect. Her behavior is normal.          Assessment & Plan:

## 2012-07-11 NOTE — Assessment & Plan Note (Signed)
Well controlled.  Monitor electrolytes and kidney function.  BP: 102/64 mmHg

## 2012-07-11 NOTE — Assessment & Plan Note (Signed)
67 year old white female smoker with early bronchitis. Treat with doxycycline 100 mg twice daily for 10 days. Sample of Dulera MDI provided. Instructed patient on proper use of metered-dose inhaler. Patient to use over-the-counter Mucinex or Mucinex DM for cough as needed.  Patient advised to call office if symptoms persist or worsen.  Smoking cessation strongly encouraged.

## 2012-07-11 NOTE — Assessment & Plan Note (Signed)
Monitor thyroid function studies

## 2012-07-14 ENCOUNTER — Other Ambulatory Visit: Payer: Self-pay | Admitting: *Deleted

## 2012-07-14 DIAGNOSIS — E785 Hyperlipidemia, unspecified: Secondary | ICD-10-CM

## 2012-07-14 DIAGNOSIS — E039 Hypothyroidism, unspecified: Secondary | ICD-10-CM

## 2012-07-14 MED ORDER — LEVOTHYROXINE SODIUM 100 MCG PO TABS: 100.0000 ug | ORAL_TABLET | Freq: Every day | ORAL | Status: DC

## 2012-07-14 MED ORDER — SIMVASTATIN 20 MG PO TABS: 20.0000 mg | ORAL_TABLET | Freq: Every day | ORAL | Status: DC

## 2012-07-18 ENCOUNTER — Telehealth: Payer: Self-pay | Admitting: Internal Medicine

## 2012-07-18 MED ORDER — CLONAZEPAM 1 MG PO TABS: 1.0000 mg | ORAL_TABLET | Freq: Every evening | ORAL | Status: DC | PRN

## 2012-07-18 NOTE — Telephone Encounter (Signed)
rx called in

## 2012-07-18 NOTE — Telephone Encounter (Signed)
Ok to refill x 5 

## 2012-07-18 NOTE — Telephone Encounter (Signed)
Patient called stating that she has been trying to get her clonazepam 1mg  1po at bedtime prn for sleep and Grover Canavan states they requested and we never responded. Please assist as patient now only has one left.

## 2012-08-11 ENCOUNTER — Other Ambulatory Visit: Payer: Self-pay | Admitting: *Deleted

## 2012-08-11 MED ORDER — OMEPRAZOLE 20 MG PO CPDR: 20.0000 mg | DELAYED_RELEASE_CAPSULE | Freq: Two times a day (BID) | ORAL | Status: DC

## 2012-08-14 ENCOUNTER — Other Ambulatory Visit: Payer: Self-pay | Admitting: Neurosurgery

## 2012-08-14 DIAGNOSIS — M545 Low back pain: Secondary | ICD-10-CM

## 2012-08-18 ENCOUNTER — Ambulatory Visit: Payer: Medicare Other | Admitting: Physical Therapy

## 2012-08-21 ENCOUNTER — Other Ambulatory Visit: Payer: Medicare Other

## 2012-08-22 ENCOUNTER — Other Ambulatory Visit: Payer: Medicare Other

## 2012-08-26 ENCOUNTER — Ambulatory Visit
Admission: RE | Admit: 2012-08-26 | Discharge: 2012-08-26 | Disposition: A | Discharge status: Home / Self Care | Payer: Medicare Other | Source: Ambulatory Visit | Attending: Neurosurgery | Admitting: Neurosurgery

## 2012-08-26 DIAGNOSIS — M545 Low back pain: Secondary | ICD-10-CM

## 2012-09-01 ENCOUNTER — Ambulatory Visit: Payer: Medicare Other | Admitting: Rehabilitation

## 2012-09-08 ENCOUNTER — Ambulatory Visit: Payer: Medicare Other | Admitting: Physical Therapy

## 2012-09-18 ENCOUNTER — Other Ambulatory Visit: Payer: Self-pay | Admitting: Neurosurgery

## 2012-09-19 ENCOUNTER — Encounter (HOSPITAL_COMMUNITY): Payer: Self-pay | Admitting: Pharmacy Technician

## 2012-09-26 ENCOUNTER — Encounter (HOSPITAL_COMMUNITY): Payer: Medicare Other

## 2012-10-16 ENCOUNTER — Telehealth: Payer: Self-pay | Admitting: Internal Medicine

## 2012-10-16 ENCOUNTER — Other Ambulatory Visit: Payer: Self-pay | Admitting: *Deleted

## 2012-10-16 MED ORDER — HYDROCHLOROTHIAZIDE 25 MG PO TABS: 25.0000 mg | ORAL_TABLET | Freq: Every day | ORAL | Status: DC

## 2012-10-16 NOTE — Telephone Encounter (Signed)
rx sent in electronically 

## 2012-10-16 NOTE — Telephone Encounter (Signed)
PT would like a refill of her hydrochlorothiazide (HYDRODIURIL) 25 MG tablet, 1 tablet daily, through [facility]. Please assist.

## 2012-10-21 ENCOUNTER — Encounter: Payer: Self-pay | Admitting: Family Medicine

## 2012-10-21 ENCOUNTER — Ambulatory Visit: Payer: Medicare Other | Admitting: Internal Medicine

## 2012-10-21 ENCOUNTER — Ambulatory Visit (INDEPENDENT_AMBULATORY_CARE_PROVIDER_SITE_OTHER): Payer: Medicare Other | Admitting: Family Medicine

## 2012-10-21 ENCOUNTER — Telehealth: Payer: Self-pay | Admitting: Family Medicine

## 2012-10-21 VITALS — BP 120/72 | HR 72 | Temp 97.6°F | Wt 141.0 lb

## 2012-10-21 DIAGNOSIS — R609 Edema, unspecified: Secondary | ICD-10-CM

## 2012-10-21 DIAGNOSIS — R600 Localized edema: Secondary | ICD-10-CM

## 2012-10-21 LAB — BASIC METABOLIC PANEL
BUN: 11 mg/dL (ref 6–23)
CO2: 29 mEq/L (ref 19–32)
Calcium: 8.3 mg/dL — ABNORMAL LOW (ref 8.4–10.5)
Chloride: 95 mEq/L — ABNORMAL LOW (ref 96–112)
Creatinine, Ser: 0.6 mg/dL (ref 0.4–1.2)
GFR: 107.75 mL/min (ref >60.00)
Glucose, Bld: 80 mg/dL (ref 70–99)
Potassium: 3.4 mEq/L — ABNORMAL LOW (ref 3.5–5.1)
Sodium: 133 mEq/L — ABNORMAL LOW (ref 135–145)

## 2012-10-21 LAB — BRAIN NATRIURETIC PEPTIDE: Pro B Natriuretic peptide (BNP): 37 pg/mL (ref 0.0–100.0)

## 2012-10-21 LAB — TSH: TSH: 0.81 u[IU]/mL (ref 0.35–5.50)

## 2012-10-21 MED ORDER — FUROSEMIDE 20 MG PO TABS: 20.0000 mg | ORAL_TABLET | Freq: Every day | ORAL | Status: DC

## 2012-10-21 NOTE — Telephone Encounter (Signed)
Please let her know kidney, heart and thyroid labs all were good.

## 2012-10-21 NOTE — Progress Notes (Signed)
Chief Complaint  Patient presents with  . swelling legs and feet    started amitriptyline 25 mg on 3/20 and took for 7 days then started swelling     HPI:  Acute visit for feet swelling/ medication side effect: -67 yo pt of Dr. Olegario Messier -on [facility] has lots of pain issues - chronic back issues, OA of knee - followed by Dr. Vear Clock for pain -she has hardly been able to walk related to back issues and put on amitriptyline for this and cause feet swelling when she is up on feet -she stopped the medication, but feet swelling has not gone down -reports had issues with swelling in her legs in the past and her PCP put her on hctz and this helped -denies: CP, SOB, Orthopnea, DOE, palpitations, recent changes in diet  ROS: See pertinent positives and negatives per HPI.  Past Medical History  Diagnosis Date  . Chronic low back pain     followed by Dr Vear Clock pain mgt  . Hypertension   . Hyperlipemia   . Osteoarthritis of left knee     advanced  . Tobacco abuse   . Hypothyroidism   . History of cardiac arrhythmia     cardiologist- Traci Turner  . Esophageal stricture   . Thyroid nodule   . Colon polyp   . Sleep apnea     10-15 YRS AGO DOES NOT USE MACH  . Dysrhythmia     IRREG     . GERD (gastroesophageal reflux disease)     Family History  Problem Relation Age of Onset  . Pancreatic cancer Father     deceased age 27  . Cancer Father   . Diabetes type II Mother     deceased age 80  . Hypertension Mother   . Coronary artery disease Mother   . Diabetes Mother   . Cancer Mother     Thyroid and Skin  . Diabetes Sister   . Hypertension Sister   . Cancer Sister     Breast  . Other Neg Hx     No family history of  colon cancer  . Hypertension Brother     History   Social History  . Marital Status: Divorced    Spouse Name: N/A    Number of Children: N/A  . Years of Education: N/A   Social History Main Topics  . Smoking status: Current Every Day Smoker  . Smokeless  tobacco: None     Comment: 1 ppd - 20 years  . Alcohol Use: No  . Drug Use: No  . Sexually Active: None   Other Topics Concern  . None   Social History Narrative   Divorced   Current Smoker  1 ppd -  20 yrs      Alcohol use-no       [employer] - laid off     Current outpatient prescriptions:aspirin EC 81 MG tablet, Take 81 mg by mouth daily., Disp: , Rfl: ;  bisacodyl (DULCOLAX) 5 MG EC tablet, Take 5 mg by mouth at bedtime., Disp: , Rfl: ;  carisoprodol (SOMA) 350 MG tablet, Take 350 mg by mouth 3 (three) times daily as needed for muscle spasms. For muscle spasms, Disp: , Rfl:  clonazePAM (KLONOPIN) 1 MG tablet, Take 1 tablet (1 mg total) by mouth at bedtime as needed. For sleep, Disp: 30 tablet, Rfl: 5;  Cyanocobalamin (VITAMIN B-12) 5000 MCG SUBL, Place 1 tablet under the tongue daily., Disp: , Rfl: ;  Diclofenac Sodium (PENNSAID) 1.5 % SOLN, Place 1 each onto the skin See admin instructions. Patient puts a drop on fingertips and rubs into the skin., Disp: , Rfl:  estradiol (ESTRACE) 0.5 MG tablet, Take 1 tablet by mouth daily., Disp: , Rfl: ;  furosemide (LASIX) 20 MG tablet, Take 1 tablet (20 mg total) by mouth daily., Disp: 5 tablet, Rfl: 0;  GLUCOSAMINE PO, Take 2 tablets by mouth daily., Disp: , Rfl: ;  hydrochlorothiazide (HYDRODIURIL) 25 MG tablet, Take 1 tablet (25 mg total) by mouth daily., Disp: 90 tablet, Rfl: 1 HYDROcodone-acetaminophen (NORCO) 10-325 MG per tablet, Take 1 tablet by mouth every 4 (four) hours. , Disp: , Rfl: ;  levothyroxine (SYNTHROID, LEVOTHROID) 100 MCG tablet, Take 1 tablet (100 mcg total) by mouth daily., Disp: 90 tablet, Rfl: 1;  Omega-3 Fatty Acids (FISH OIL) 1200 MG CAPS, Take 2,400 mg by mouth daily., Disp: , Rfl:  omeprazole (PRILOSEC) 20 MG capsule, Take 1 capsule (20 mg total) by mouth 2 (two) times daily., Disp: 180 capsule, Rfl: 3;  potassium chloride SA (KLOR-CON M20) 20 MEQ tablet, Take 1 tablet (20 mEq total) by mouth 2 (two) times  daily., Disp: 180 tablet, Rfl: 2;  simvastatin (ZOCOR) 20 MG tablet, Take 1 tablet (20 mg total) by mouth at bedtime., Disp: 90 tablet, Rfl: 1  EXAM:  Filed Vitals:   10/21/12 1515  BP: 120/72  Pulse: 72  Temp: 97.6 F (36.4 C)    Body mass index is 25.78 kg/(m^2).  GENERAL: vitals reviewed and listed above, alert, oriented, appears well hydrated and in no acute distress  HEENT: atraumatic, conjunttiva clear, no obvious abnormalities on inspection of external nose and ears  NECK: no obvious masses on inspection, no JVD  LUNGS: clear to auscultation bilaterally, no wheezes, rales or rhonchi, good air movement  CV: HRRR, bilat peripheral edema - skin changes c/w venous stasis  MS: moves all extremities without noticeable abnormality  PSYCH: pleasant and cooperative, no obvious depression or anxiety  ASSESSMENT AND PLAN:  Discussed the following assessment and plan:  Lower extremity edema - Plan: Basic metabolic panel, TSH, Brain Natriuretic Peptide, DISCONTINUED: furosemide (LASIX) 20 MG tablet  Peripheral edema - Plan: EKG 12-Lead  -likely venous stasis given hx of this in the past and findings on exam, but she is worried about other causes - no other symptoms to or findings on exam to suggest CHF, heart issues, etc. EKG ok, labs pending. Rx for compression stockings, advised low sodium diet and elevation. Will also give low dose lasix for 3 days after discussion of risks as she wants Advised follow up with PCP in 2 weeks. Advised to hold off on amitriptyline and discuss with prescribing doctor. ->25 minutes spent with this patient. She is not happy about wearing compression stockings. -Patient advised to return or notify a doctor immediately if symptoms worsen or persist or new concerns arise.  Patient Instructions  -elevate legs for 30 minutes above waist daily  -compression stockings daily  -low sodium diet  -lasix once daily for 3-5 days  -follow up with your  doctor in 2 weeks     KIM, HANNAH R.

## 2012-10-21 NOTE — Patient Instructions (Addendum)
-  elevate legs for 30 minutes above waist daily  -compression stockings daily  -low sodium diet  -lasix once daily for 3-5 days  -follow up with your doctor in 2 weeks

## 2012-10-22 NOTE — Telephone Encounter (Signed)
Called and spoke with pt and pt is aware.  

## 2012-10-23 ENCOUNTER — Telehealth: Payer: Self-pay | Admitting: Internal Medicine

## 2012-10-23 MED ORDER — POTASSIUM CHLORIDE CRYS ER 20 MEQ PO TBCR: 20.0000 meq | EXTENDED_RELEASE_TABLET | Freq: Two times a day (BID) | ORAL | Status: DC

## 2012-10-23 NOTE — Telephone Encounter (Signed)
Pt needs refill of potassium chloride SA (KLOR-CON M20) 20 MEQ tablet. 90 day supply  Sent to   [facility] Pt is out and would like if you could send a 14 day supply to [facility], [place]

## 2012-10-23 NOTE — Telephone Encounter (Signed)
This was done.

## 2012-11-04 ENCOUNTER — Telehealth: Payer: Self-pay | Admitting: Internal Medicine

## 2012-11-04 NOTE — Telephone Encounter (Signed)
Please ask pt to contact our billing office regarding her billing inquiry.

## 2012-11-04 NOTE — Telephone Encounter (Signed)
PT called and stated that she was instructed to speak with you regarding a billing problem. She paid for a claim that we where also reimbursed for, therefor she owes [employer] the balance. Please assist.

## 2012-11-05 ENCOUNTER — Ambulatory Visit (INDEPENDENT_AMBULATORY_CARE_PROVIDER_SITE_OTHER): Payer: Medicare Other | Admitting: Internal Medicine

## 2012-11-05 ENCOUNTER — Encounter: Payer: Self-pay | Admitting: Internal Medicine

## 2012-11-05 VITALS — BP 108/84 | HR 64 | Temp 97.8°F | Wt 137.0 lb

## 2012-11-05 DIAGNOSIS — R609 Edema, unspecified: Secondary | ICD-10-CM

## 2012-11-05 DIAGNOSIS — R600 Localized edema: Secondary | ICD-10-CM

## 2012-11-05 NOTE — Progress Notes (Signed)
Subjective:    Patient ID: DARNELLE DERRICK, female    DOB: 03-07-1946, 67 y.o.   MRN: [id number]  HPI  67 year old white female with history of tobacco use, hypothyroidism and chronic low back pain for followup. She was seen by Dr. Vear Clock who started amitriptyline. Patient experienced significant lower extremity edema secondary to medication. She was evaluated by Dr. Selena Batten who attributed her symptoms to venous insufficiency. She took several doses of Lasix which seemed to improve her edema. Her BNP was normal.  Patient concerned that her edema is improved but not resolved. She denies any unusual chest pain or shortness of breath.  Review of Systems Negative for pelvic pain, negative for chest pain or shortness of breath  Past Medical History  Diagnosis Date  . Chronic low back pain     followed by Dr Vear Clock pain mgt  . Hypertension   . Hyperlipemia   . Osteoarthritis of left knee     advanced  . Tobacco abuse   . Hypothyroidism   . History of cardiac arrhythmia     cardiologist- Traci Turner  . Esophageal stricture   . Thyroid nodule   . Colon polyp   . Sleep apnea     10-15 YRS AGO DOES NOT USE MACH  . Dysrhythmia     IRREG     . GERD (gastroesophageal reflux disease)     History   Social History  . Marital Status: Divorced    Spouse Name: N/A    Number of Children: N/A  . Years of Education: N/A   Occupational History  . Not on file.   Social History Main Topics  . Smoking status: Current Every Day Smoker  . Smokeless tobacco: Not on file     Comment: 1 ppd - 20 years  . Alcohol Use: No  . Drug Use: No  . Sexually Active: Not on file   Other Topics Concern  . Not on file   Social History Narrative   Divorced   Current Smoker  1 ppd -  20 yrs      Alcohol use-no       [employer] - laid off     Past Surgical History  Procedure Laterality Date  . Other surgical history      4 Lumbar surgeries  . Lumbar fusion      and rods  .  Cesarean section    . Ankle surgery      left ankle 1995  . Infusion pump implantation      history of implantablet morphine pump  . Partial hysterectomy    . Knee surgery  1991  . Elbow surgery  1990's  . Thyroidectomy      2012  . Cervical spine surgery      2012  . Foot surgery      RT FOOT SPUR REMOVED 2012   . Shoulder surgery      09/2011  LEFT  . Abdominal hysterectomy    . Back surgery      BACK X 4     Family History  Problem Relation Age of Onset  . Pancreatic cancer Father     deceased age 51  . Cancer Father   . Diabetes type II Mother     deceased age 26  . Hypertension Mother   . Coronary artery disease Mother   . Diabetes Mother   . Cancer Mother     Thyroid and Skin  . Diabetes Sister   .  Hypertension Sister   . Cancer Sister     Breast  . Other Neg Hx     No family history of  colon cancer  . Hypertension Brother     Allergies  Allergen Reactions  . Gabapentin     REACTION: Itching Ask patient to clarify "Neurogan" on history form dated 09/25/10.  Marland Kitchen Hydrochlorothiazide W-Triamterene     REACTION: rash and itching    Current Outpatient Prescriptions on File Prior to Visit  Medication Sig Dispense Refill  . aspirin EC 81 MG tablet Take 81 mg by mouth daily.      . bisacodyl (DULCOLAX) 5 MG EC tablet Take 5 mg by mouth at bedtime.      . carisoprodol (SOMA) 350 MG tablet Take 350 mg by mouth 3 (three) times daily as needed for muscle spasms. For muscle spasms      . clonazePAM (KLONOPIN) 1 MG tablet Take 1 tablet (1 mg total) by mouth at bedtime as needed. For sleep  30 tablet  5  . Cyanocobalamin (VITAMIN B-12) 5000 MCG SUBL Place 1 tablet under the tongue daily.      . Diclofenac Sodium (PENNSAID) 1.5 % SOLN Place 1 each onto the skin See admin instructions. Patient puts a drop on fingertips and rubs into the skin.      Marland Kitchen estradiol (ESTRACE) 0.5 MG tablet Take 1 tablet by mouth daily.      Marland Kitchen GLUCOSAMINE PO Take 2 tablets by mouth daily.       . hydrochlorothiazide (HYDRODIURIL) 25 MG tablet Take 1 tablet (25 mg total) by mouth daily.  90 tablet  1  . HYDROcodone-acetaminophen (NORCO) 10-325 MG per tablet Take 1 tablet by mouth every 4 (four) hours.       Marland Kitchen levothyroxine (SYNTHROID, LEVOTHROID) 100 MCG tablet Take 1 tablet (100 mcg total) by mouth daily.  90 tablet  1  . Omega-3 Fatty Acids (FISH OIL) 1200 MG CAPS Take 2,400 mg by mouth daily.      Marland Kitchen omeprazole (PRILOSEC) 20 MG capsule Take 1 capsule (20 mg total) by mouth 2 (two) times daily.  180 capsule  3  . potassium chloride SA (KLOR-CON M20) 20 MEQ tablet Take 1 tablet (20 mEq total) by mouth 2 (two) times daily.  180 tablet  3  . simvastatin (ZOCOR) 20 MG tablet Take 1 tablet (20 mg total) by mouth at bedtime.  90 tablet  1   No current facility-administered medications on file prior to visit.    BP 108/84  Pulse 64  Temp(Src) 97.8 F (36.6 C) (Oral)  Wt 137 lb (62.143 kg)  BMI 25.05 kg/m2       Objective:   Physical Exam  Constitutional: She is oriented to person, place, and time. She appears well-developed and well-nourished. No distress.  Cardiovascular: Normal rate, regular rhythm and normal heart sounds.   Pulmonary/Chest: Effort normal and breath sounds normal. She has no wheezes.  Abdominal: Soft. Bowel sounds are normal. She exhibits no mass.  Negative for inguinal adenopathy  Musculoskeletal:  Bilateral pitting edema-left worse than right (+1 pitting edema up to mid left calf)  Neurological: She is alert and oriented to person, place, and time. No cranial nerve deficit.  Skin:  Mild erythema of lower legs bilaterally  Psychiatric: She has a normal mood and affect. Her behavior is normal.          Assessment & Plan:

## 2012-11-05 NOTE — Patient Instructions (Addendum)
Our office will contact you re: ultrasound results

## 2012-11-05 NOTE — Assessment & Plan Note (Addendum)
68 year old white female with unexplained lower extremity edema. I doubt her symptoms secondary to side effect from amitriptyline. Obtain venous Doppler of bilateral lower extremities. Also obtain pelvic ultrasound to rule out obstructive lesion.  Continue HCTZ.  She will contact office if she feels she needs refill of lasix.

## 2012-11-07 ENCOUNTER — Telehealth: Payer: Self-pay | Admitting: Internal Medicine

## 2012-11-07 MED ORDER — FUROSEMIDE 20 MG PO TABS: 20.0000 mg | ORAL_TABLET | Freq: Every day | ORAL | Status: DC

## 2012-11-07 NOTE — Telephone Encounter (Signed)
Caller: Alissandra/Patient; Phone: [phone]; Reason for Call: Pt calling today 11/07/12 regarding she was in to see Dr.  Artist Pais on Wednesday 11/05/12 regarding swelling in her leg.  He told pt if swelling got worse to call and he would call in some Lasix for her.  Pt said the swelling has gotten worse and she is going to the beach tomorrow and would like for MD to call in the Lasix like he told her he would do.  (Pt declines triage). SHE NEEDS THIS TODAY.  PLEASE CALL THIS IN TO [facility] [address] [phone] AND CALL PT BACK TO LET HER KNOW THIS HAS BEEN DONE.  NEEDS THIS TODAY BECAUSE GOING OUT OF TOWN IN AM.  Thanks.

## 2012-11-07 NOTE — Telephone Encounter (Signed)
rx sent in electronically, pt aware 

## 2012-11-07 NOTE — Telephone Encounter (Signed)
Ok for rx for furosemide 20 mg one po qd prn # 30 no refills

## 2012-11-17 ENCOUNTER — Ambulatory Visit
Admission: RE | Admit: 2012-11-17 | Discharge: 2012-11-17 | Disposition: A | Discharge status: Home / Self Care | Payer: Medicare Other | Source: Ambulatory Visit | Attending: Internal Medicine | Admitting: Internal Medicine

## 2012-11-17 DIAGNOSIS — R600 Localized edema: Secondary | ICD-10-CM

## 2012-11-19 ENCOUNTER — Telehealth: Payer: Self-pay | Admitting: *Deleted

## 2012-11-19 NOTE — Telephone Encounter (Signed)
Dr Artist Pais told pt to take Melatonin 3 mg but she has 10 mg.  She just realized it.  She has been sleeping a lot and wants to know if she is taking to much.  She is also taking clonazepam 1 mg.  Please advise

## 2012-11-19 NOTE — Telephone Encounter (Signed)
Pt said its capsule.  She will go buy 3 mg cause it come in 3 mg

## 2012-11-19 NOTE — Telephone Encounter (Signed)
Yes, decrease melatonin to 2.5 mg or 1/4 of 10 mg

## 2012-11-20 ENCOUNTER — Encounter (INDEPENDENT_AMBULATORY_CARE_PROVIDER_SITE_OTHER): Payer: Medicare Other

## 2012-11-20 DIAGNOSIS — R600 Localized edema: Secondary | ICD-10-CM

## 2012-11-20 DIAGNOSIS — R609 Edema, unspecified: Secondary | ICD-10-CM

## 2012-11-20 DIAGNOSIS — M79609 Pain in unspecified limb: Secondary | ICD-10-CM

## 2012-11-20 NOTE — Telephone Encounter (Signed)
Pt aware.

## 2012-11-20 NOTE — Telephone Encounter (Signed)
She should also consider taking 1/2 of clonazepam 1 mg

## 2012-11-21 ENCOUNTER — Ambulatory Visit (HOSPITAL_COMMUNITY)
Admission: RE | Admit: 2012-11-21 | Discharge: 2012-11-21 | Disposition: A | Discharge status: Home / Self Care | Payer: Medicare Other | Source: Ambulatory Visit | Attending: Neurosurgery | Admitting: Neurosurgery

## 2012-11-21 ENCOUNTER — Encounter (HOSPITAL_COMMUNITY): Payer: Self-pay

## 2012-11-21 ENCOUNTER — Encounter (HOSPITAL_COMMUNITY)
Admission: RE | Admit: 2012-11-21 | Discharge: 2012-11-21 | Disposition: A | Discharge status: Home / Self Care | Payer: Medicare Other | Source: Ambulatory Visit | Attending: Neurosurgery | Admitting: Neurosurgery

## 2012-11-21 ENCOUNTER — Telehealth: Payer: Self-pay | Admitting: *Deleted

## 2012-11-21 DIAGNOSIS — M4120 Other idiopathic scoliosis, site unspecified: Secondary | ICD-10-CM | POA: Insufficient documentation

## 2012-11-21 DIAGNOSIS — Z01812 Encounter for preprocedural laboratory examination: Secondary | ICD-10-CM | POA: Insufficient documentation

## 2012-11-21 DIAGNOSIS — Z01818 Encounter for other preprocedural examination: Secondary | ICD-10-CM | POA: Insufficient documentation

## 2012-11-21 DIAGNOSIS — F17200 Nicotine dependence, unspecified, uncomplicated: Secondary | ICD-10-CM | POA: Insufficient documentation

## 2012-11-21 LAB — SURGICAL PCR SCREEN
MRSA, PCR: NEGATIVE
Staphylococcus aureus: POSITIVE — AB

## 2012-11-21 LAB — CBC
HCT: 37.8 % (ref 36.0–46.0)
Hemoglobin: 12.9 g/dL (ref 12.0–15.0)
MCH: 29.9 pg (ref 26.0–34.0)
MCHC: 34.1 g/dL (ref 30.0–36.0)
MCV: 87.5 fL (ref 78.0–100.0)
Platelets: 214 10*3/uL (ref 150–400)
RBC: 4.32 MIL/uL (ref 3.87–5.11)
RDW: 13.5 % (ref 11.5–15.5)
WBC: 5 10*3/uL (ref 4.0–10.5)

## 2012-11-21 LAB — BASIC METABOLIC PANEL
BUN: 18 mg/dL (ref 6–23)
CO2: 32 mEq/L (ref 19–32)
Calcium: 8.5 mg/dL (ref 8.4–10.5)
Chloride: 101 mEq/L (ref 96–112)
Creatinine, Ser: 0.73 mg/dL (ref 0.50–1.10)
GFR calc Af Amer: >90 mL/min (ref >90)
GFR calc non Af Amer: 86 mL/min — ABNORMAL LOW (ref >90)
Glucose, Bld: 77 mg/dL (ref 70–99)
Potassium: 3.4 mEq/L — ABNORMAL LOW (ref 3.5–5.1)
Sodium: 140 mEq/L (ref 135–145)

## 2012-11-21 NOTE — Telephone Encounter (Signed)
Yes, she can go back to taking 1 tab at bedtime

## 2012-11-21 NOTE — Pre-Procedure Instructions (Signed)
Joan [name]  11/21/2012   Your procedure is scheduled on:  Friday, May 23rd  Report to [facility] at 0530 AM.  Call this number if you have problems the morning of surgery: [phone]   Remember:   Do not eat food or drink liquids after midnight.    Take these medicines the morning of surgery with A SIP OF WATER: synthroid, prilosec, hydrcodone if needed   Do not wear jewelry, make-up or nail polish.  Do not wear lotions, powders, or perfumes,deodorant.  Do not shave 48 hours prior to surgery.   Do not bring valuables to the hospital.  Contacts, dentures or bridgework may not be worn into surgery.  Leave suitcase in the car. After surgery it may be brought to your room.  For patients admitted to the hospital, checkout time is 11:00 AM the day of discharge.   Patients discharged the day of surgery will not be allowed to drive home.   Special Instructions: Shower using CHG 2 nights before surgery and the night before surgery.  If you shower the day of surgery use CHG.  Use special wash - you have one bottle of CHG for all showers.  You should use approximately 1/3 of the bottle for each shower.   Please read over the following fact sheets that you were given: Pain Booklet, Coughing and Deep Breathing, MRSA Information and Surgical Site Infection Prevention

## 2012-11-21 NOTE — Telephone Encounter (Signed)
Patient states she has tried half tab of klonopin with melatonin  and was only able to sleep 2 hours.  She states that her legs are swollen.  She would like to know if she can go back to 1 tab at bedtime? Or is there something else she can try?

## 2012-11-21 NOTE — Telephone Encounter (Signed)
Left detailed message on machine on machine for patient

## 2012-11-24 ENCOUNTER — Encounter: Payer: Self-pay | Admitting: Internal Medicine

## 2012-11-24 ENCOUNTER — Ambulatory Visit (INDEPENDENT_AMBULATORY_CARE_PROVIDER_SITE_OTHER): Payer: Medicare Other | Admitting: Internal Medicine

## 2012-11-24 VITALS — BP 104/80 | Temp 98.2°F | Wt 128.0 lb

## 2012-11-24 DIAGNOSIS — L0390 Cellulitis, unspecified: Secondary | ICD-10-CM | POA: Insufficient documentation

## 2012-11-24 DIAGNOSIS — L03119 Cellulitis of unspecified part of limb: Secondary | ICD-10-CM | POA: Insufficient documentation

## 2012-11-24 DIAGNOSIS — I10 Essential (primary) hypertension: Secondary | ICD-10-CM

## 2012-11-24 DIAGNOSIS — L0291 Cutaneous abscess, unspecified: Secondary | ICD-10-CM

## 2012-11-24 LAB — BASIC METABOLIC PANEL
BUN: 19 mg/dL (ref 6–23)
CO2: 29 mEq/L (ref 19–32)
Calcium: 8.6 mg/dL (ref 8.4–10.5)
Chloride: 100 mEq/L (ref 96–112)
Creatinine, Ser: 1.1 mg/dL (ref 0.4–1.2)
GFR: 52.49 mL/min — ABNORMAL LOW (ref >60.00)
Glucose, Bld: 97 mg/dL (ref 70–99)
Potassium: 4 mEq/L (ref 3.5–5.1)
Sodium: 138 mEq/L (ref 135–145)

## 2012-11-24 MED ORDER — SULFAMETHOXAZOLE-TMP DS 800-160 MG PO TABS: 1.0000 | ORAL_TABLET | Freq: Two times a day (BID) | ORAL | Status: DC

## 2012-11-24 NOTE — Patient Instructions (Signed)
Please contact our office if your symptoms do not improve or gets worse.  

## 2012-11-24 NOTE — Assessment & Plan Note (Signed)
67 year old white female presents with probable cellulitis of left foot (involving second third digit). Treat with Bactrim DS twice daily for 10 days. 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  Patient advised to call office if symptoms persist or worsen.

## 2012-11-24 NOTE — Progress Notes (Signed)
Subjective:    Patient ID: Joan [name], female    DOB: 12-19-1945, 67 y.o.   MRN: [id number]  HPI  67 year old white female with history of tobacco use, hypothyroidism and chronic low back pain for followup. Patient reports her edema is improved since starting Lasix.  Patient complains of blisters and second and third digit of left foot. Her symptoms started after she picks stressors last Monday. She noticed blisters on top of left foot on Wednesday.  Patient recently seen for preop hospital visit for back surgery. 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 She is using Bactroban twice daily. Review of Systems Negative for fever chills  Past Medical History  Diagnosis Date  . Chronic low back pain     followed by Dr Vear Clock pain mgt  . Hypertension   . Hyperlipemia   . Osteoarthritis of left knee     advanced  . Tobacco abuse   . Hypothyroidism   . History of cardiac arrhythmia     cardiologist- Traci Turner  . Esophageal stricture   . Thyroid nodule   . Colon polyp   . Dysrhythmia     IRREG     . GERD (gastroesophageal reflux disease)   . Sleep apnea     10-15 YRS AGO DOES NOT USE [facility]ACH    History   Social History  . Marital Status: Divorced    Spouse Name: N/A    Number of Children: N/A  . Years of Education: N/A   Occupational History  . Not on file.   Social History Main Topics  . Smoking status: Current Every Day Smoker -- 1.00 packs/day for 20 years    Types: Cigarettes  . Smokeless tobacco: Not on file     Comment: 1 ppd - 20 years  . Alcohol Use: No  . Drug Use: No  . Sexually Active: Not on file   Other Topics Concern  . Not on file   Social History Narrative   Divorced   Current Smoker  1 ppd -  20 yrs      Alcohol use-no       [employer] - laid off     Past Surgical History  Procedure Laterality Date  . Other surgical history      4 Lumbar surgeries  . Lumbar fusion      and rods  . Cesarean section    . Ankle  surgery      left ankle 1995  . Infusion pump implantation      history of implantablet morphine pump  . Partial hysterectomy    . Knee surgery  1991  . Elbow surgery  1990's  . Thyroidectomy      2012  . Cervical spine surgery      2012  . Foot surgery      RT FOOT SPUR REMOVED 2012   . Shoulder surgery      09/2011  LEFT  . Abdominal hysterectomy    . Back surgery      BACK X 4     Family History  Problem Relation Age of Onset  . Pancreatic cancer Father     deceased age 49  . Cancer Father   . Diabetes type II Mother     deceased age 69  . Hypertension Mother   . Coronary artery disease Mother   . Diabetes Mother   . Cancer Mother     Thyroid and Skin  . Diabetes Sister   .  Hypertension Sister   . Cancer Sister     Breast  . Other Neg Hx     No family history of  colon cancer  . Hypertension Brother     Allergies  Allergen Reactions  . Amitriptyline Other (See Comments)    Leg swelling  . Gabapentin     REACTION: Itching Ask patient to clarify "Neurogan" on history form dated 09/25/10.  Marland Kitchen Hydrochlorothiazide W-Triamterene     REACTION: rash and itching    Current Outpatient Prescriptions on File Prior to Visit  Medication Sig Dispense Refill  . bisacodyl (DULCOLAX) 5 MG EC tablet Take 5 mg by mouth at bedtime.      . carisoprodol (SOMA) 350 MG tablet Take 350 mg by mouth 3 (three) times daily as needed for muscle spasms. For muscle spasms      . clonazePAM (KLONOPIN) 1 MG tablet Take 1 tablet (1 mg total) by mouth at bedtime as needed. For sleep  30 tablet  5  . Cyanocobalamin (VITAMIN B-12) 5000 MCG SUBL Place 1 tablet under the tongue daily.      . Diclofenac Sodium (PENNSAID) 1.5 % SOLN Place 1 each onto the skin See admin instructions. Patient puts a drop on fingertips and rubs into the skin.      Marland Kitchen estradiol (ESTRACE) 0.5 MG tablet Take 1 tablet by mouth daily.      . furosemide (LASIX) 20 MG tablet Take 1 tablet (20 mg total) by mouth daily.  30  tablet  3  . GLUCOSAMINE PO Take 2 tablets by mouth daily.      . hydrochlorothiazide (HYDRODIURIL) 25 MG tablet Take 1 tablet (25 mg total) by mouth daily.  90 tablet  1  . HYDROcodone-acetaminophen (NORCO) 10-325 MG per tablet Take 1 tablet by mouth every 4 (four) hours.       Marland Kitchen levothyroxine (SYNTHROID, LEVOTHROID) 100 MCG tablet Take 1 tablet (100 mcg total) by mouth daily.  90 tablet  1  . Omega-3 Fatty Acids (FISH OIL) 1200 MG CAPS Take 2,400 mg by mouth daily.      Marland Kitchen omeprazole (PRILOSEC) 20 MG capsule Take 1 capsule (20 mg total) by mouth 2 (two) times daily.  180 capsule  3  . potassium chloride SA (KLOR-CON M20) 20 MEQ tablet Take 1 tablet (20 mEq total) by mouth 2 (two) times daily.  180 tablet  3  . simvastatin (ZOCOR) 20 MG tablet Take 1 tablet (20 mg total) by mouth at bedtime.  90 tablet  1   No current facility-administered medications on file prior to visit.    BP 104/80  Temp(Src) 98.2 F (36.8 C) (Oral)  Wt 128 lb (58.06 kg)  BMI 23.41 kg/m2       Objective:   Physical Exam  Constitutional: She appears well-developed and well-nourished.  Cardiovascular: Normal rate, regular rhythm and normal heart sounds.   Pulmonary/Chest: Effort normal and breath sounds normal. She has no wheezes.  Skin:  Blisters with mild surrounding redness top of 2nd and 3 rd digit of left foot.            Assessment & Plan:

## 2012-11-27 MED ORDER — DEXAMETHASONE SODIUM PHOSPHATE 10 MG/ML IJ SOLN
10.0000 mg | INTRAMUSCULAR | Status: DC
  Filled 2012-11-27: qty 1

## 2012-11-27 MED ORDER — CEFAZOLIN SODIUM-DEXTROSE 2-3 GM-% IV SOLR
2.0000 g | INTRAVENOUS | Status: AC
  Administered 2012-11-28: 2 g via INTRAVENOUS
  Filled 2012-11-27: qty 50

## 2012-11-28 ENCOUNTER — Encounter (HOSPITAL_COMMUNITY): Payer: Self-pay | Admitting: Anesthesiology

## 2012-11-28 ENCOUNTER — Ambulatory Visit (HOSPITAL_COMMUNITY): Payer: Medicare Other

## 2012-11-28 ENCOUNTER — Encounter (HOSPITAL_COMMUNITY): Payer: Self-pay

## 2012-11-28 ENCOUNTER — Ambulatory Visit (HOSPITAL_COMMUNITY)
Admission: RE | Admit: 2012-11-28 | Discharge: 2012-11-29 | Disposition: A | Discharge status: Home / Self Care | Payer: Medicare Other | Source: Ambulatory Visit | Attending: Neurosurgery | Admitting: Neurosurgery

## 2012-11-28 ENCOUNTER — Encounter (HOSPITAL_COMMUNITY)
Admission: RE | Disposition: A | Discharge status: Home / Self Care | Payer: Self-pay | Source: Ambulatory Visit | Attending: Neurosurgery

## 2012-11-28 ENCOUNTER — Ambulatory Visit (HOSPITAL_COMMUNITY): Payer: Medicare Other | Admitting: Anesthesiology

## 2012-11-28 DIAGNOSIS — I10 Essential (primary) hypertension: Secondary | ICD-10-CM | POA: Insufficient documentation

## 2012-11-28 DIAGNOSIS — F17200 Nicotine dependence, unspecified, uncomplicated: Secondary | ICD-10-CM | POA: Insufficient documentation

## 2012-11-28 DIAGNOSIS — M47817 Spondylosis without myelopathy or radiculopathy, lumbosacral region: Secondary | ICD-10-CM | POA: Insufficient documentation

## 2012-11-28 DIAGNOSIS — R209 Unspecified disturbances of skin sensation: Secondary | ICD-10-CM | POA: Insufficient documentation

## 2012-11-28 DIAGNOSIS — E785 Hyperlipidemia, unspecified: Secondary | ICD-10-CM | POA: Insufficient documentation

## 2012-11-28 HISTORY — PX: LUMBAR LAMINECTOMY/DECOMPRESSION MICRODISCECTOMY: SHX5026

## 2012-11-28 SURGERY — LUMBAR LAMINECTOMY/DECOMPRESSION MICRODISCECTOMY 1 LEVEL
Anesthesia: General | Site: Back | Wound class: Clean

## 2012-11-28 MED ORDER — THROMBIN 5000 UNITS EX SOLR
CUTANEOUS | Status: DC | PRN
  Administered 2012-11-28 (×2): 5000 [IU] via TOPICAL

## 2012-11-28 MED ORDER — LIDOCAINE-EPINEPHRINE 1 %-1:100000 IJ SOLN
INTRAMUSCULAR | Status: DC | PRN
  Administered 2012-11-28: 5 mL

## 2012-11-28 MED ORDER — ONDANSETRON HCL 4 MG/2ML IJ SOLN: 4.0000 mg | INTRAMUSCULAR | Status: DC | PRN

## 2012-11-28 MED ORDER — ONDANSETRON HCL 4 MG/2ML IJ SOLN
INTRAMUSCULAR | Status: DC | PRN
  Administered 2012-11-28: 4 mg via INTRAVENOUS

## 2012-11-28 MED ORDER — LEVOTHYROXINE SODIUM 100 MCG PO TABS
100.0000 ug | ORAL_TABLET | Freq: Every day | ORAL | Status: DC
  Administered 2012-11-29: 100 ug via ORAL
  Filled 2012-11-28 (×2): qty 1

## 2012-11-28 MED ORDER — HEMOSTATIC AGENTS (NO CHARGE) OPTIME
TOPICAL | Status: DC | PRN
  Administered 2012-11-28: 1 via TOPICAL

## 2012-11-28 MED ORDER — BACITRACIN 50000 UNITS IM SOLR
INTRAMUSCULAR | Status: AC
  Filled 2012-11-28: qty 1

## 2012-11-28 MED ORDER — SULFAMETHOXAZOLE-TRIMETHOPRIM 800-160 MG PO TABS: 1.0000 | ORAL_TABLET | Freq: Two times a day (BID) | ORAL | Status: DC

## 2012-11-28 MED ORDER — SODIUM CHLORIDE 0.9 % IJ SOLN: 3.0000 mL | INTRAMUSCULAR | Status: DC | PRN

## 2012-11-28 MED ORDER — BISACODYL 5 MG PO TBEC
5.0000 mg | DELAYED_RELEASE_TABLET | Freq: Every day | ORAL | Status: DC
  Administered 2012-11-28: 5 mg via ORAL
  Filled 2012-11-28 (×2): qty 1

## 2012-11-28 MED ORDER — POTASSIUM CHLORIDE CRYS ER 20 MEQ PO TBCR
20.0000 meq | EXTENDED_RELEASE_TABLET | Freq: Two times a day (BID) | ORAL | Status: DC
  Administered 2012-11-28 – 2012-11-29 (×3): 20 meq via ORAL
  Filled 2012-11-28 (×4): qty 1

## 2012-11-28 MED ORDER — LIDOCAINE HCL 4 % MT SOLN
OROMUCOSAL | Status: DC | PRN
  Administered 2012-11-28: 4 mL via TOPICAL

## 2012-11-28 MED ORDER — OXYCODONE HCL 5 MG PO TABS
ORAL_TABLET | ORAL | Status: AC
  Filled 2012-11-28: qty 1

## 2012-11-28 MED ORDER — ACETAMINOPHEN 650 MG RE SUPP: 650.0000 mg | RECTAL | Status: DC | PRN

## 2012-11-28 MED ORDER — MIDAZOLAM HCL 5 MG/5ML IJ SOLN
INTRAMUSCULAR | Status: DC | PRN
  Administered 2012-11-28: 1 mg via INTRAVENOUS

## 2012-11-28 MED ORDER — DOCUSATE SODIUM 100 MG PO CAPS
100.0000 mg | ORAL_CAPSULE | Freq: Two times a day (BID) | ORAL | Status: DC
  Administered 2012-11-28 (×2): 100 mg via ORAL
  Filled 2012-11-28 (×2): qty 1

## 2012-11-28 MED ORDER — BUPIVACAINE HCL (PF) 0.25 % IJ SOLN
INTRAMUSCULAR | Status: DC | PRN
  Administered 2012-11-28: 5 mL

## 2012-11-28 MED ORDER — MENTHOL 3 MG MT LOZG: 1.0000 | LOZENGE | OROMUCOSAL | Status: DC | PRN

## 2012-11-28 MED ORDER — SIMVASTATIN 20 MG PO TABS
20.0000 mg | ORAL_TABLET | Freq: Every day | ORAL | Status: DC
  Administered 2012-11-28: 20 mg via ORAL
  Filled 2012-11-28 (×2): qty 1

## 2012-11-28 MED ORDER — FUROSEMIDE 20 MG PO TABS
20.0000 mg | ORAL_TABLET | Freq: Every day | ORAL | Status: DC
  Administered 2012-11-28 – 2012-11-29 (×2): 20 mg via ORAL
  Filled 2012-11-28 (×2): qty 1

## 2012-11-28 MED ORDER — CLONAZEPAM 0.5 MG PO TABS
1.0000 mg | ORAL_TABLET | Freq: Every day | ORAL | Status: DC
  Administered 2012-11-28: 1 mg via ORAL
  Filled 2012-11-28 (×2): qty 2

## 2012-11-28 MED ORDER — LACTATED RINGERS IV SOLN
INTRAVENOUS | Status: DC | PRN
  Administered 2012-11-28 (×2): via INTRAVENOUS

## 2012-11-28 MED ORDER — FENTANYL CITRATE 0.05 MG/ML IJ SOLN
INTRAMUSCULAR | Status: DC | PRN
  Administered 2012-11-28 (×2): 50 ug via INTRAVENOUS
  Administered 2012-11-28: 75 ug via INTRAVENOUS

## 2012-11-28 MED ORDER — PHENYLEPHRINE HCL 10 MG/ML IJ SOLN
INTRAMUSCULAR | Status: DC | PRN
  Administered 2012-11-28: 40 ug via INTRAVENOUS
  Administered 2012-11-28: 80 ug via INTRAVENOUS

## 2012-11-28 MED ORDER — ESTRADIOL 1 MG PO TABS
0.5000 mg | ORAL_TABLET | Freq: Every day | ORAL | Status: DC
  Administered 2012-11-28: 0.5 mg via ORAL
  Filled 2012-11-28 (×2): qty 0.5

## 2012-11-28 MED ORDER — ONDANSETRON HCL 4 MG/2ML IJ SOLN: 4.0000 mg | Freq: Four times a day (QID) | INTRAMUSCULAR | Status: DC | PRN

## 2012-11-28 MED ORDER — EPHEDRINE SULFATE 50 MG/ML IJ SOLN
INTRAMUSCULAR | Status: DC | PRN
  Administered 2012-11-28 (×4): 10 mg via INTRAVENOUS

## 2012-11-28 MED ORDER — OMEGA-3-ACID ETHYL ESTERS 1 G PO CAPS
1.0000 g | ORAL_CAPSULE | Freq: Two times a day (BID) | ORAL | Status: DC
  Administered 2012-11-28 – 2012-11-29 (×3): 1 g via ORAL
  Filled 2012-11-28 (×4): qty 1

## 2012-11-28 MED ORDER — GLYCOPYRROLATE 0.2 MG/ML IJ SOLN
INTRAMUSCULAR | Status: DC | PRN
  Administered 2012-11-28: 0.6 mg via INTRAVENOUS

## 2012-11-28 MED ORDER — PHENOL 1.4 % MT LIQD: 1.0000 | OROMUCOSAL | Status: DC | PRN

## 2012-11-28 MED ORDER — MUPIROCIN 2 % EX OINT
1.0000 "application " | TOPICAL_OINTMENT | Freq: Two times a day (BID) | CUTANEOUS | Status: DC
  Administered 2012-11-28: 1 via TOPICAL
  Filled 2012-11-28: qty 22

## 2012-11-28 MED ORDER — SODIUM CHLORIDE 0.9 % IV SOLN
250.0000 mL | INTRAVENOUS | Status: DC
  Administered 2012-11-28: 20 mL via INTRAVENOUS

## 2012-11-28 MED ORDER — ARTIFICIAL TEARS OP OINT
TOPICAL_OINTMENT | OPHTHALMIC | Status: DC | PRN
  Administered 2012-11-28: 1 via OPHTHALMIC

## 2012-11-28 MED ORDER — OXYCODONE HCL 5 MG PO TABS
5.0000 mg | ORAL_TABLET | Freq: Once | ORAL | Status: AC | PRN
  Administered 2012-11-28: 5 mg via ORAL

## 2012-11-28 MED ORDER — PANTOPRAZOLE SODIUM 40 MG PO TBEC
40.0000 mg | DELAYED_RELEASE_TABLET | Freq: Every day | ORAL | Status: DC
  Filled 2012-11-28: qty 1

## 2012-11-28 MED ORDER — HYDROMORPHONE HCL PF 1 MG/ML IJ SOLN
0.2500 mg | INTRAMUSCULAR | Status: DC | PRN
  Administered 2012-11-28 (×2): 0.5 mg via INTRAVENOUS

## 2012-11-28 MED ORDER — OXYCODONE-ACETAMINOPHEN 5-325 MG PO TABS: 1.0000 | ORAL_TABLET | ORAL | Status: DC | PRN

## 2012-11-28 MED ORDER — SODIUM CHLORIDE 0.9 % IR SOLN
Status: DC | PRN
  Administered 2012-11-28: 08:00:00

## 2012-11-28 MED ORDER — 0.9 % SODIUM CHLORIDE (POUR BTL) OPTIME
TOPICAL | Status: DC | PRN
  Administered 2012-11-28: 1000 mL

## 2012-11-28 MED ORDER — CEFAZOLIN SODIUM 1-5 GM-% IV SOLN
1.0000 g | Freq: Three times a day (TID) | INTRAVENOUS | Status: AC
  Administered 2012-11-28 – 2012-11-29 (×2): 1 g via INTRAVENOUS
  Filled 2012-11-28 (×2): qty 50

## 2012-11-28 MED ORDER — PROPOFOL 10 MG/ML IV BOLUS
INTRAVENOUS | Status: DC | PRN
  Administered 2012-11-28: 160 mg via INTRAVENOUS

## 2012-11-28 MED ORDER — CYCLOBENZAPRINE HCL 10 MG PO TABS
10.0000 mg | ORAL_TABLET | Freq: Three times a day (TID) | ORAL | Status: DC | PRN
  Administered 2012-11-28: 10 mg via ORAL
  Filled 2012-11-28: qty 1

## 2012-11-28 MED ORDER — HYDROMORPHONE HCL PF 1 MG/ML IJ SOLN: 0.5000 mg | INTRAMUSCULAR | Status: DC | PRN

## 2012-11-28 MED ORDER — SODIUM CHLORIDE 0.9 % IJ SOLN
3.0000 mL | Freq: Two times a day (BID) | INTRAMUSCULAR | Status: DC
  Administered 2012-11-28: 3 mL via INTRAVENOUS

## 2012-11-28 MED ORDER — LIDOCAINE HCL (CARDIAC) 20 MG/ML IV SOLN
INTRAVENOUS | Status: DC | PRN
  Administered 2012-11-28: 70 mg via INTRAVENOUS

## 2012-11-28 MED ORDER — ACETAMINOPHEN 325 MG PO TABS: 650.0000 mg | ORAL_TABLET | ORAL | Status: DC | PRN

## 2012-11-28 MED ORDER — HYDROCHLOROTHIAZIDE 25 MG PO TABS
25.0000 mg | ORAL_TABLET | Freq: Every day | ORAL | Status: DC
  Administered 2012-11-28 – 2012-11-29 (×2): 25 mg via ORAL
  Filled 2012-11-28 (×2): qty 1

## 2012-11-28 MED ORDER — HYDROCODONE-ACETAMINOPHEN 10-325 MG PO TABS
1.0000 | ORAL_TABLET | ORAL | Status: DC
  Administered 2012-11-28 – 2012-11-29 (×6): 1 via ORAL
  Filled 2012-11-28 (×6): qty 1

## 2012-11-28 MED ORDER — SULFAMETHOXAZOLE-TMP DS 800-160 MG PO TABS
1.0000 | ORAL_TABLET | Freq: Two times a day (BID) | ORAL | Status: DC
  Administered 2012-11-28 – 2012-11-29 (×3): 1 via ORAL
  Filled 2012-11-28 (×4): qty 1

## 2012-11-28 MED ORDER — OXYCODONE-ACETAMINOPHEN 5-325 MG PO TABS
1.0000 | ORAL_TABLET | ORAL | Status: DC | PRN
  Administered 2012-11-29: 2 via ORAL
  Filled 2012-11-28: qty 2

## 2012-11-28 MED ORDER — ROCURONIUM BROMIDE 100 MG/10ML IV SOLN
INTRAVENOUS | Status: DC | PRN
  Administered 2012-11-28: 50 mg via INTRAVENOUS

## 2012-11-28 MED ORDER — OXYCODONE HCL 5 MG/5ML PO SOLN
5.0000 mg | Freq: Once | ORAL | Status: AC | PRN
Start: 2012-11-28 — End: 2012-11-28

## 2012-11-28 MED ORDER — NEOSTIGMINE METHYLSULFATE 1 MG/ML IJ SOLN
INTRAMUSCULAR | Status: DC | PRN
  Administered 2012-11-28: 4 mg via INTRAVENOUS

## 2012-11-28 MED ORDER — CARISOPRODOL 350 MG PO TABS
350.0000 mg | ORAL_TABLET | Freq: Four times a day (QID) | ORAL | Status: DC
  Administered 2012-11-28 – 2012-11-29 (×4): 350 mg via ORAL
  Filled 2012-11-28 (×4): qty 1

## 2012-11-28 MED ORDER — SODIUM CHLORIDE 0.9 % IV SOLN
INTRAVENOUS | Status: AC
  Filled 2012-11-28: qty 500

## 2012-11-28 SURGICAL SUPPLY — 53 items
BAG DECANTER FOR FLEXI CONT (MISCELLANEOUS) ×2 IMPLANT
BENZOIN TINCTURE PRP APPL 2/3 (GAUZE/BANDAGES/DRESSINGS) ×2 IMPLANT
BLADE SURG 11 STRL SS (BLADE) ×2 IMPLANT
BLADE SURG ROTATE 9660 (MISCELLANEOUS) IMPLANT
BRUSH SCRUB EZ PLAIN DRY (MISCELLANEOUS) ×2 IMPLANT
BUR MATCHSTICK NEURO 3.0 LAGG (BURR) ×2 IMPLANT
BUR PRECISION FLUTE 6.0 (BURR) ×2 IMPLANT
CANISTER SUCTION 2500CC (MISCELLANEOUS) ×2 IMPLANT
CLOTH BEACON ORANGE TIMEOUT ST (SAFETY) ×2 IMPLANT
CONT SPEC 4OZ CLIKSEAL STRL BL (MISCELLANEOUS) ×2 IMPLANT
DECANTER SPIKE VIAL GLASS SM (MISCELLANEOUS) ×2 IMPLANT
DERMABOND ADVANCED (GAUZE/BANDAGES/DRESSINGS) ×1
DERMABOND ADVANCED .7 DNX12 (GAUZE/BANDAGES/DRESSINGS) ×1 IMPLANT
DRAPE LAPAROTOMY 100X72X124 (DRAPES) ×2 IMPLANT
DRAPE MICROSCOPE LEICA (MISCELLANEOUS) ×2 IMPLANT
DRAPE MICROSCOPE ZEISS OPMI (DRAPES) ×2 IMPLANT
DRAPE POUCH INSTRU U-SHP 10X18 (DRAPES) ×2 IMPLANT
DRAPE PROXIMA HALF (DRAPES) IMPLANT
DRAPE SURG 17X23 STRL (DRAPES) ×2 IMPLANT
DRSG OPSITE 4X5.5 SM (GAUZE/BANDAGES/DRESSINGS) ×2 IMPLANT
DRSG OPSITE POSTOP 4X8 (GAUZE/BANDAGES/DRESSINGS) ×2 IMPLANT
DURAPREP 26ML APPLICATOR (WOUND CARE) ×2 IMPLANT
ELECT REM PT RETURN 9FT ADLT (ELECTROSURGICAL) ×2
ELECTRODE REM PT RTRN 9FT ADLT (ELECTROSURGICAL) ×1 IMPLANT
GAUZE SPONGE 4X4 16PLY XRAY LF (GAUZE/BANDAGES/DRESSINGS) IMPLANT
GLOVE BIO SURGEON STRL SZ8 (GLOVE) ×2 IMPLANT
GLOVE EXAM NITRILE LRG STRL (GLOVE) IMPLANT
GLOVE EXAM NITRILE MD LF STRL (GLOVE) IMPLANT
GLOVE EXAM NITRILE XL STR (GLOVE) IMPLANT
GLOVE EXAM NITRILE XS STR PU (GLOVE) IMPLANT
GLOVE INDICATOR 8.5 STRL (GLOVE) ×2 IMPLANT
GOWN BRE IMP SLV AUR LG STRL (GOWN DISPOSABLE) ×2 IMPLANT
GOWN BRE IMP SLV AUR XL STRL (GOWN DISPOSABLE) ×4 IMPLANT
GOWN STRL REIN 2XL LVL4 (GOWN DISPOSABLE) IMPLANT
KIT BASIN OR (CUSTOM PROCEDURE TRAY) ×2 IMPLANT
KIT ROOM TURNOVER OR (KITS) ×2 IMPLANT
NEEDLE HYPO 22GX1.5 SAFETY (NEEDLE) ×2 IMPLANT
NEEDLE SPNL 22GX3.5 QUINCKE BK (NEEDLE) ×2 IMPLANT
NS IRRIG 1000ML POUR BTL (IV SOLUTION) ×2 IMPLANT
PACK LAMINECTOMY NEURO (CUSTOM PROCEDURE TRAY) ×2 IMPLANT
RUBBERBAND STERILE (MISCELLANEOUS) ×4 IMPLANT
SPONGE GAUZE 4X4 12PLY (GAUZE/BANDAGES/DRESSINGS) ×2 IMPLANT
SPONGE SURGIFOAM ABS GEL SZ50 (HEMOSTASIS) ×2 IMPLANT
STRIP CLOSURE SKIN 1/2X4 (GAUZE/BANDAGES/DRESSINGS) ×2 IMPLANT
SUT VIC AB 0 CT1 18XCR BRD8 (SUTURE) ×1 IMPLANT
SUT VIC AB 0 CT1 8-18 (SUTURE) ×1
SUT VIC AB 2-0 CT1 18 (SUTURE) ×2 IMPLANT
SUT VICRYL 4-0 PS2 18IN ABS (SUTURE) ×2 IMPLANT
SYR 20ML ECCENTRIC (SYRINGE) ×2 IMPLANT
TAPE STRIPS DRAPE STRL (GAUZE/BANDAGES/DRESSINGS) ×2 IMPLANT
TOWEL OR 17X24 6PK STRL BLUE (TOWEL DISPOSABLE) ×2 IMPLANT
TOWEL OR 17X26 10 PK STRL BLUE (TOWEL DISPOSABLE) ×2 IMPLANT
WATER STERILE IRR 1000ML POUR (IV SOLUTION) ×2 IMPLANT

## 2012-11-28 NOTE — Preoperative (Signed)
Beta Blockers   Reason not to administer Beta Blockers:Not Applicable 

## 2012-11-28 NOTE — Anesthesia Postprocedure Evaluation (Signed)
Anesthesia Post Note  Patient: Joan [name]  Procedure(s) Performed: Procedure(s) (LRB): DECOMPRESSIVE LUMBAR LAMINECTOMY LEVEL 1 (N/A)  Anesthesia type: General  Patient location: PACU  Post pain: Pain level controlled and Adequate analgesia  Post assessment: Post-op Vital signs reviewed, Patient's Cardiovascular Status Stable, Respiratory Function Stable, Patent Airway and Pain level controlled  Last Vitals:  Filed Vitals:   11/28/12 0958  BP: 102/56  Pulse: 80  Temp:   Resp: 14    Post vital signs: Reviewed and stable  Level of consciousness: awake, alert  and oriented  Complications: No apparent anesthesia complications

## 2012-11-28 NOTE — Anesthesia Procedure Notes (Signed)
Procedure Name: Intubation Date/Time: 11/28/2012 7:46 AM Performed by: Gayla Medicus Pre-anesthesia Checklist: Patient identified, Timeout performed, Emergency Drugs available, Suction available and Patient being monitored Patient Re-evaluated:Patient Re-evaluated prior to inductionOxygen Delivery Method: Circle system utilized Preoxygenation: Pre-oxygenation with 100% oxygen Intubation Type: IV induction Ventilation: Mask ventilation without difficulty and Oral airway inserted - appropriate to patient size Laryngoscope Size: Mac and 3 Grade View: Grade III Tube type: Oral Tube size: 7.5 mm Number of attempts: 1 Airway Equipment and Method: Stylet and LTA kit utilized Placement Confirmation: ETT inserted through vocal cords under direct vision,  positive ETCO2 and breath sounds checked- equal and bilateral Secured at: 21 cm Tube secured with: Tape Dental Injury: Teeth and Oropharynx as per pre-operative assessment

## 2012-11-28 NOTE — Progress Notes (Signed)
Pt has blisters on 2 toes from irritation from shoes. Has been on antibiotics and states Dr. Stann Mainland look at them this am.

## 2012-11-28 NOTE — H&P (Signed)
Joan [name] is an 67 y.o. female.   Chief Complaint: Back and right leg pain HPI: Patient is a very pleasant 67 year old female whose had long-standing back trouble she originally underwent an L3-L5 fusion per Dr. Channing Mutters and again just had progressive worsening back and radicular pain stemming from this to the spaces above this and have her fusion extended. And she did well for a while until the last couple months which she's had progressive worsening right hip and leg pain rate in the posterior lateral aspect of her right thigh the posterior aspect of her calf and the top or foot numbness tingling his institution. This was felt to be consistent with an L5 nerve root pattern she underwent to nerve blocks with temporary relief both for diagnostic and therapeutic purposes. Workup with CT MRI scan showed residual stenosis in the L5 nerve root 7 from just inferior to the L4-5 disc space to just distal to the L5 take couple of the pedicle. And due to patient's failure of conservative treatment imaging findings and progression of clinical syndrome I recommended decompression and foraminotomy of the L5 nerve root right I extensively went over the risks and benefits of the operation with her as well as perioperative course expectations of outcome and alternatives of surgery she understood and agreed to proceed forward. I extensively explained to her that is possible to the fact that this is ray below fused level but ultimately this may fail and she may have to have L5-S1 fusion in addition. She understands all this and agrees to proceed forward with the foraminotomies initially.  Past Medical History  Diagnosis Date  . Chronic low back pain     followed by Dr Vear Clock pain mgt  . Hypertension   . Hyperlipemia   . Osteoarthritis of left knee     advanced  . Tobacco abuse   . Hypothyroidism   . History of cardiac arrhythmia     cardiologist- Traci Turner  . Esophageal stricture   . Thyroid nodule   .  Colon polyp   . Dysrhythmia     IRREG     . GERD (gastroesophageal reflux disease)   . Sleep apnea     10-15 YRS AGO DOES NOT USE [facility]    Past Surgical History  Procedure Laterality Date  . Other surgical history      4 Lumbar surgeries  . Lumbar fusion      and rods  . Cesarean section    . Ankle surgery      left ankle 1995  . Infusion pump implantation      history of implantablet morphine pump  . Partial hysterectomy    . Knee surgery  1991  . Elbow surgery  1990's  . Thyroidectomy      2012  . Cervical spine surgery      2012  . Foot surgery      RT FOOT SPUR REMOVED 2012   . Shoulder surgery      09/2011  LEFT  . Abdominal hysterectomy    . Back surgery      BACK X 4     Family History  Problem Relation Age of Onset  . Pancreatic cancer Father     deceased age 15  . Cancer Father   . Diabetes type II Mother     deceased age 73  . Hypertension Mother   . Coronary artery disease Mother   . Diabetes Mother   . Cancer Mother  Thyroid and Skin  . Diabetes Sister   . Hypertension Sister   . Cancer Sister     Breast  . Other Neg Hx     No family history of  colon cancer  . Hypertension Brother    Social History:  reports that she has been smoking Cigarettes.  She has a 20 pack-year smoking history. She does not have any smokeless tobacco history on file. She reports that she does not drink alcohol or use illicit drugs.  Allergies:  Allergies  Allergen Reactions  . Amitriptyline Other (See Comments)    Leg swelling  . Gabapentin     REACTION: Itching Ask patient to clarify "Neurogan" on history form dated 09/25/10.  Marland Kitchen Hydrochlorothiazide W-Triamterene     REACTION: rash and itching    Medications Prior to Admission  Medication Sig Dispense Refill  . bisacodyl (DULCOLAX) 5 MG EC tablet Take 5 mg by mouth at bedtime.      . carisoprodol (SOMA) 350 MG tablet Take 350 mg by mouth 3 (three) times daily as needed for muscle spasms. For muscle spasms       . clonazePAM (KLONOPIN) 1 MG tablet Take 1 tablet (1 mg total) by mouth at bedtime as needed. For sleep  30 tablet  5  . Cyanocobalamin (VITAMIN B-12) 5000 MCG SUBL Place 1 tablet under the tongue daily.      Marland Kitchen estradiol (ESTRACE) 0.5 MG tablet Take 1 tablet by mouth daily.      . furosemide (LASIX) 20 MG tablet Take 1 tablet (20 mg total) by mouth daily.  30 tablet  3  . GLUCOSAMINE PO Take 2 tablets by mouth daily.      . hydrochlorothiazide (HYDRODIURIL) 25 MG tablet Take 1 tablet (25 mg total) by mouth daily.  90 tablet  1  . HYDROcodone-acetaminophen (NORCO) 10-325 MG per tablet Take 1 tablet by mouth every 4 (four) hours.       Marland Kitchen levothyroxine (SYNTHROID, LEVOTHROID) 100 MCG tablet Take 1 tablet (100 mcg total) by mouth daily.  90 tablet  1  . Omega-3 Fatty Acids (FISH OIL) 1200 MG CAPS Take 2,400 mg by mouth daily.      Marland Kitchen omeprazole (PRILOSEC) 20 MG capsule Take 1 capsule (20 mg total) by mouth 2 (two) times daily.  180 capsule  3  . potassium chloride SA (KLOR-CON M20) 20 MEQ tablet Take 1 tablet (20 mEq total) by mouth 2 (two) times daily.  180 tablet  3  . simvastatin (ZOCOR) 20 MG tablet Take 1 tablet (20 mg total) by mouth at bedtime.  90 tablet  1  . sulfamethoxazole-trimethoprim (BACTRIM DS,SEPTRA DS) 800-160 MG per tablet Take 1 tablet by mouth 2 (two) times daily. 10 day course. Started on 11/24/2012      . Diclofenac Sodium (PENNSAID) 1.5 % SOLN Place 1 each onto the skin See admin instructions. Patient puts a drop on fingertips and rubs into the skin.      . mupirocin ointment (BACTROBAN) 2 % Apply 1 application topically 2 (two) times daily.        No results found for this or any previous visit (from the past 48 hour(s)). No results found.  Review of Systems  Constitutional: Negative.   HENT: Negative.   Eyes: Negative.   Respiratory: Negative.   Cardiovascular: Negative.   Gastrointestinal: Negative.   Genitourinary: Negative.   Musculoskeletal: Positive for back  pain.  Skin: Negative.   Neurological: Positive for tremors.  Endo/Heme/Allergies: Negative.  Blood pressure 104/61, pulse 67, temperature 98 F (36.7 C), temperature source Oral, resp. rate 18, SpO2 98.00%. Physical Exam  Constitutional: She is oriented to person, place, and time. She appears well-developed and well-nourished.  HENT:  Head: Normocephalic.  Eyes: Pupils are equal, round, and reactive to light.  Neck: Normal range of motion.  Cardiovascular: Normal rate.   Respiratory: Effort normal.  GI: Soft.  Neurological: She is alert and oriented to person, place, and time. She has normal strength. GCS eye subscore is 4. GCS verbal subscore is 5. GCS motor subscore is 6.  Reflex Scores:      Patellar reflexes are 0 on the right side and 0 on the left side.      Achilles reflexes are 0 on the right side and 0 on the left side. He is so as, quads, hip she's, gastrocs, anterior tibialis, and EHL.     Assessment/Plan A 67 year old female presents for right-sided L5-S1 decompressive laminectomy to decompress the L5 nerve root  Kambre Messner P 11/28/2012, 7:32 AM

## 2012-11-28 NOTE — Transfer of Care (Signed)
Immediate Anesthesia Transfer of Care Note  Patient: Joan [name]  Procedure(s) Performed: Procedure(s) with comments: DECOMPRESSIVE LUMBAR LAMINECTOMY LEVEL 1 (N/A) - DECOMPRESSIVE LUMBAR LAMINECTOMY LEVEL 1  Patient Location: PACU  Anesthesia Type:General  Level of Consciousness: awake, alert  and oriented  Airway & Oxygen Therapy: Patient Spontanous Breathing and Patient connected to nasal cannula oxygen  Post-op Assessment: Report given to PACU RN, Post -op Vital signs reviewed and stable and Patient moving all extremities X 4  Post vital signs: Reviewed and stable  Complications: No apparent anesthesia complications

## 2012-11-28 NOTE — Op Note (Signed)
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  EBL: Minimal  History of present illness: Patient is very pleasant 67 year old female has undergone multiple previous back operations to the conclusion of fusion down L5 with previous laminotomy on the right at L5-S1. Over last several months patient has had a new onset of right leg radicular pain characterized as posterior lateral thigh posterior calf, foot with numbness tingling sensation consistent with an L5 nerve root pattern. Imaging revealed spondylosis with a spur coming off the medial aspect pedicle and facet extending up to the just inferior to the L4-5 disc space and down to the takeoff of the L5 nerve root patient underwent nerve blocks with temporary symptomatically the ultimate failure of epidurals at this level. Due to patient's progression clinical syndrome failed conservative treatment imaging findings and recommended a redo laminectomy and foraminotomies the L5 nerve root on the right transverse benefits of the operation with her as well as perioperative course expectations of outcome alternatives of surgery and shin showed and agreed to proceed forward.  Operative procedure: Patient brought into the or was induced under general anesthesia positioned prone the Wilson frame her back was prepped and draped in routine sterile fashion her old incision was opened up and the inferior aspect the scar tissues dissected free the spinous processes and lamina of L5 as well as L4 were identified the residual lamina at L5 and L4 were also dissected free down to the residual lamina of S1 then dissecting  the scar tissue the inferior and superior aspect of the residual lamina of L5 was completely identified as well as the medial facet complex between L4-5 interoperative X. identify the appropriate level so laminotomy was begun and partial medial facetectomy was also begun by dissecting the scar tissue off of the medial facet complex. Using a high-speed drill and the Kerrison punch this was performed identify the thecal sac. Under MAC supplementation the L5 nerve root was identified and the L5 pedicle was identified foraminotomy was carried out inferior the L5 pedicle opening up the distal part of the L5 nerve root was a fairly sizable spur coming off the medial facet complex displacing the proximal L5 nerve root at the level just above the L5 pedicle this was all removed in piecemeal fashion decompressing the proximal L5 nerve root extending this up to just at the level above the L4-5 disc space the after adequate from a coronary.and toxic or easily passed superior and inferior the L5 nerve root confirming patency of the entirety of the proximal and distal aspects of this foramen. X-ray was again repeated to confirm adequate decompression superiorly and inferiorly and then the wound scope was irrigated meticulous in space was maintained Gelfoam was overlaid top of the dura the microscope was removed and the wounds closed with interrupted Vicryl as a running 4 septic or benzoin and Steris were applied patient recovered in stable condition. At the case on it counts sponge counts were correct.

## 2012-11-28 NOTE — Anesthesia Preprocedure Evaluation (Signed)
Anesthesia Evaluation  Patient identified by MRN, date of birth, ID band Patient awake    Reviewed: Allergy & Precautions, H&P , NPO status , Patient's Chart, lab work & pertinent test results  Airway Mallampati: II  Neck ROM: full    Dental   Pulmonary sleep apnea , Current Smoker,          Cardiovascular hypertension,     Neuro/Psych    GI/Hepatic GERD-  ,  Endo/Other  Hypothyroidism   Renal/GU      Musculoskeletal  (+) Arthritis -,   Abdominal   Peds  Hematology   Anesthesia Other Findings   Reproductive/Obstetrics                           Anesthesia Physical Anesthesia Plan  ASA: III  Anesthesia Plan: General   Post-op Pain Management:    Induction: Intravenous  Airway Management Planned: Oral ETT  Additional Equipment:   Intra-op Plan:   Post-operative Plan: Extubation in OR  Informed Consent: I have reviewed the patients History and Physical, chart, labs and discussed the procedure including the risks, benefits and alternatives for the proposed anesthesia with the patient or authorized representative who has indicated his/her understanding and acceptance.     Plan Discussed with: CRNA, Anesthesiologist and Surgeon  Anesthesia Plan Comments:         Anesthesia Quick Evaluation

## 2012-11-29 MED ORDER — CLONAZEPAM 0.5 MG PO TABS
1.0000 mg | ORAL_TABLET | Freq: Every day | ORAL | Status: DC
  Administered 2012-11-29: 1 mg via ORAL

## 2012-11-29 NOTE — Progress Notes (Signed)
Pt. Alert and oriented,follows simple instructions, denies pain. Incision area without swelling, redness or S/S of infection. Voiding adequate clear yellow urine. Moving all extremities well and vitals stable and documented. Lumbar surgery notes instructions given to patient and family member for home safety and precautions. Pt. and family stated understanding of instructions given.  

## 2012-12-02 ENCOUNTER — Encounter (HOSPITAL_COMMUNITY): Payer: Self-pay | Admitting: Neurosurgery

## 2012-12-26 ENCOUNTER — Ambulatory Visit (INDEPENDENT_AMBULATORY_CARE_PROVIDER_SITE_OTHER): Payer: Medicare Other | Admitting: Internal Medicine

## 2012-12-26 ENCOUNTER — Encounter: Payer: Self-pay | Admitting: Internal Medicine

## 2012-12-26 VITALS — BP 114/72 | Temp 97.9°F | Wt 135.0 lb

## 2012-12-26 DIAGNOSIS — R609 Edema, unspecified: Secondary | ICD-10-CM

## 2012-12-26 NOTE — Progress Notes (Signed)
Subjective:    Patient ID: Joan [name], female    DOB: 1946-03-04, 67 y.o.   MRN: [id number]  HPI  67 year old white female for followup. Since previous visit she underwent low back surgery. Her activity level has been limited. Over the last 2-3 weeks her lower extremity edema has gotten significantly worse. She recently restarted taking Lasix. She does not have significant diuresis with current dose of Lasix.  She denies chest pain or shortness of breath.  Review of Systems She reports mild redness in both legs, negative for chest pain or shortness of breath    Past Medical History  Diagnosis Date  . Chronic low back pain     followed by Dr Vear Clock pain mgt  . Hypertension   . Hyperlipemia   . Osteoarthritis of left knee     advanced  . Tobacco abuse   . Hypothyroidism   . History of cardiac arrhythmia     cardiologist- Traci Turner  . Esophageal stricture   . Thyroid nodule   . Colon polyp   . Dysrhythmia     IRREG     . GERD (gastroesophageal reflux disease)   . Sleep apnea     10-15 YRS AGO DOES NOT USE MACH    History   Social History  . Marital Status: Divorced    Spouse Name: N/A    Number of Children: N/A  . Years of Education: N/A   Occupational History  . Not on file.   Social History Main Topics  . Smoking status: Current Every Day Smoker -- 1.00 packs/day for 20 years    Types: Cigarettes  . Smokeless tobacco: Not on file     Comment: 1 ppd - 20 years  . Alcohol Use: No  . Drug Use: No  . Sexually Active: Not on file   Other Topics Concern  . Not on file   Social History Narrative   Divorced   Current Smoker  1 ppd -  20 yrs      Alcohol use-no       [employer] - laid off     Past Surgical History  Procedure Laterality Date  . Other surgical history      4 Lumbar surgeries  . Lumbar fusion      and rods  . Cesarean section    . Ankle surgery      left ankle 1995  . Infusion pump implantation      history of  implantablet morphine pump  . Partial hysterectomy    . Knee surgery  1991  . Elbow surgery  1990's  . Thyroidectomy      2012  . Cervical spine surgery      2012  . Foot surgery      RT FOOT SPUR REMOVED 2012   . Shoulder surgery      09/2011  LEFT  . Abdominal hysterectomy    . Back surgery      BACK X 4   . Lumbar laminectomy/decompression microdiscectomy N/A 11/28/2012    Procedure: DECOMPRESSIVE LUMBAR LAMINECTOMY LEVEL 1;  Surgeon: Mariam Dollar, MD;  Location: [facility];  Service: Neurosurgery;  Laterality: N/A;  DECOMPRESSIVE LUMBAR LAMINECTOMY LEVEL 1    Family History  Problem Relation Age of Onset  . Pancreatic cancer Father     deceased age 71  . Cancer Father   . Diabetes type II Mother     deceased age 77  . Hypertension Mother   .  Coronary artery disease Mother   . Diabetes Mother   . Cancer Mother     Thyroid and Skin  . Diabetes Sister   . Hypertension Sister   . Cancer Sister     Breast  . Other Neg Hx     No family history of  colon cancer  . Hypertension Brother     Allergies  Allergen Reactions  . Amitriptyline Other (See Comments)    Leg swelling  . Gabapentin     REACTION: Itching Ask patient to clarify "Neurogan" on history form dated 09/25/10.  Marland Kitchen Hydrochlorothiazide W-Triamterene     REACTION: rash and itching    Current Outpatient Prescriptions on File Prior to Visit  Medication Sig Dispense Refill  . bisacodyl (DULCOLAX) 5 MG EC tablet Take 5 mg by mouth at bedtime.      . carisoprodol (SOMA) 350 MG tablet Take 350 mg by mouth 3 (three) times daily as needed for muscle spasms. For muscle spasms      . clonazePAM (KLONOPIN) 1 MG tablet Take 1 tablet (1 mg total) by mouth at bedtime as needed. For sleep  30 tablet  5  . Cyanocobalamin (VITAMIN B-12) 5000 MCG SUBL Place 1 tablet under the tongue daily.      . Diclofenac Sodium (PENNSAID) 1.5 % SOLN Place 1 each onto the skin See admin instructions. Patient puts a drop on fingertips and  rubs into the skin.      Marland Kitchen estradiol (ESTRACE) 0.5 MG tablet Take 1 tablet by mouth daily.      . furosemide (LASIX) 20 MG tablet Take 1 tablet (20 mg total) by mouth daily.  30 tablet  3  . GLUCOSAMINE PO Take 2 tablets by mouth daily.      . hydrochlorothiazide (HYDRODIURIL) 25 MG tablet Take 1 tablet (25 mg total) by mouth daily.  90 tablet  1  . HYDROcodone-acetaminophen (NORCO) 10-325 MG per tablet Take 1 tablet by mouth every 4 (four) hours.       Marland Kitchen levothyroxine (SYNTHROID, LEVOTHROID) 100 MCG tablet Take 1 tablet (100 mcg total) by mouth daily.  90 tablet  1  . Omega-3 Fatty Acids (FISH OIL) 1200 MG CAPS Take 2,400 mg by mouth daily.      Marland Kitchen omeprazole (PRILOSEC) 20 MG capsule Take 1 capsule (20 mg total) by mouth 2 (two) times daily.  180 capsule  3  . oxyCODONE-acetaminophen (PERCOCET/ROXICET) 5-325 MG per tablet Take 1-2 tablets by mouth every 4 (four) hours as needed.  80 tablet  0  . potassium chloride SA (KLOR-CON M20) 20 MEQ tablet Take 1 tablet (20 mEq total) by mouth 2 (two) times daily.  180 tablet  3  . simvastatin (ZOCOR) 20 MG tablet Take 1 tablet (20 mg total) by mouth at bedtime.  90 tablet  1   No current facility-administered medications on file prior to visit.    BP 114/72  Temp(Src) 97.9 F (36.6 C) (Oral)  Wt 135 lb (61.236 kg)  BMI 24.69 kg/m2    Objective:   Physical Exam  Constitutional: She appears well-developed and well-nourished. No distress.  HENT:  Head: Normocephalic and atraumatic.  Cardiovascular: Normal rate and normal heart sounds.   Pulmonary/Chest: Effort normal and breath sounds normal. She has no wheezes.  Abdominal: Soft. Bowel sounds are normal. She exhibits no mass. There is no tenderness.  Musculoskeletal:  +1 to +2 edema of lower legs.  Left > Right.  No calf tenderness  Assessment & Plan:

## 2012-12-26 NOTE — Patient Instructions (Addendum)
Take extra dose of lasix or 40 mg for 1 week Also take extra dose of potassium supplement with higher dose of lasix Please complete the following lab tests within 1 week - BMET - 401.9

## 2012-12-26 NOTE — Assessment & Plan Note (Signed)
Patient experiencing recurrence of lower extremity edema after her recent low back surgery.  Patient has been more sedentary than usual due to postop status. Repeat venous Doppler of lower extremities.  Patient has multiple risk factors including tobacco use at hormone replacement.  Increase Lasix dose to 40 mg for one week. Use compression stockings as directed.

## 2012-12-29 ENCOUNTER — Encounter (INDEPENDENT_AMBULATORY_CARE_PROVIDER_SITE_OTHER): Payer: Medicare Other

## 2012-12-29 DIAGNOSIS — M79609 Pain in unspecified limb: Secondary | ICD-10-CM

## 2012-12-29 DIAGNOSIS — R609 Edema, unspecified: Secondary | ICD-10-CM

## 2013-01-01 ENCOUNTER — Other Ambulatory Visit (INDEPENDENT_AMBULATORY_CARE_PROVIDER_SITE_OTHER): Payer: Medicare Other

## 2013-01-01 DIAGNOSIS — E785 Hyperlipidemia, unspecified: Secondary | ICD-10-CM

## 2013-01-01 LAB — BASIC METABOLIC PANEL
BUN: 19 mg/dL (ref 6–23)
CO2: 29 mEq/L (ref 19–32)
Calcium: 8.5 mg/dL (ref 8.4–10.5)
Chloride: 97 mEq/L (ref 96–112)
Creatinine, Ser: 0.8 mg/dL (ref 0.4–1.2)
GFR: 72.63 mL/min (ref >60.00)
Glucose, Bld: 99 mg/dL (ref 70–99)
Potassium: 3.5 mEq/L (ref 3.5–5.1)
Sodium: 136 mEq/L (ref 135–145)

## 2013-01-07 ENCOUNTER — Ambulatory Visit: Payer: Medicare Other | Attending: Neurosurgery | Admitting: Physical Therapy

## 2013-01-07 ENCOUNTER — Telehealth: Payer: Self-pay | Admitting: Internal Medicine

## 2013-01-07 DIAGNOSIS — M545 Low back pain, unspecified: Secondary | ICD-10-CM | POA: Insufficient documentation

## 2013-01-07 DIAGNOSIS — R293 Abnormal posture: Secondary | ICD-10-CM | POA: Insufficient documentation

## 2013-01-07 DIAGNOSIS — M6281 Muscle weakness (generalized): Secondary | ICD-10-CM | POA: Insufficient documentation

## 2013-01-07 DIAGNOSIS — IMO0001 Reserved for inherently not codable concepts without codable children: Secondary | ICD-10-CM | POA: Insufficient documentation

## 2013-01-07 DIAGNOSIS — Z981 Arthrodesis status: Secondary | ICD-10-CM | POA: Insufficient documentation

## 2013-01-07 NOTE — Telephone Encounter (Signed)
Pt returned your call.  

## 2013-01-07 NOTE — Telephone Encounter (Signed)
Electrolytes and kidney function normal.  K is low normal.  Pt should increase intake of high K foods

## 2013-01-07 NOTE — Telephone Encounter (Signed)
Left message for pt to call back  °

## 2013-01-07 NOTE — Telephone Encounter (Signed)
PT requesting lab results from 6/26. Please assist.

## 2013-01-08 NOTE — Telephone Encounter (Signed)
Pt aware lab results

## 2013-01-13 ENCOUNTER — Ambulatory Visit: Payer: Medicare Other | Admitting: Family Medicine

## 2013-01-13 ENCOUNTER — Ambulatory Visit: Payer: Medicare Other | Admitting: Internal Medicine

## 2013-01-13 ENCOUNTER — Ambulatory Visit: Payer: Medicare Other | Admitting: Physical Therapy

## 2013-01-14 ENCOUNTER — Encounter: Payer: Medicare Other | Admitting: Physical Therapy

## 2013-01-15 ENCOUNTER — Ambulatory Visit (INDEPENDENT_AMBULATORY_CARE_PROVIDER_SITE_OTHER): Payer: Medicare Other | Admitting: Family Medicine

## 2013-01-15 ENCOUNTER — Encounter: Payer: Self-pay | Admitting: Family Medicine

## 2013-01-15 VITALS — BP 118/68 | HR 84 | Temp 97.9°F | Ht 62.0 in | Wt 137.0 lb

## 2013-01-15 DIAGNOSIS — R609 Edema, unspecified: Secondary | ICD-10-CM

## 2013-01-15 DIAGNOSIS — R600 Localized edema: Secondary | ICD-10-CM

## 2013-01-15 LAB — POCT URINALYSIS DIPSTICK
Bilirubin, UA: NEGATIVE
Blood, UA: NEGATIVE
Glucose, UA: NEGATIVE
Ketones, UA: NEGATIVE
Leukocytes, UA: NEGATIVE
Nitrite, UA: NEGATIVE
Protein, UA: NEGATIVE
Spec Grav, UA: 1.025
Urobilinogen, UA: 0.2
pH, UA: 6

## 2013-01-15 MED ORDER — FUROSEMIDE 20 MG PO TABS: 20.0000 mg | ORAL_TABLET | Freq: Every day | ORAL | Status: DC

## 2013-01-15 NOTE — Progress Notes (Signed)
Subjective:    Patient ID: Joan [name], female    DOB: 30-May-1946, 67 y.o.   MRN: [id number]  HPI Followup bilateral leg edema. Onset around April of this year. Left lower extremity greater than right. Previous venous Dopplers negative. No evidence for heart failure. Normal renal function. Normal thyroid function. Patient does not take nonsteroidals. She relates increasing edema after starting Elavil which she no longer takes. Watches sodium intake closely.    She's been wearing compression hose intermittently. Currently takes Lasix 20 mg once daily. Recently took 40 mg daily for one week and symptoms improved. No calcium channel blocker use or other medications associated with peripheral edema. Denies any dyspnea orthopnea. No history of known hypoalbuminemia Recent pelvic ultrasound no mass  Past Medical History  Diagnosis Date  . Chronic low back pain     followed by Dr Vear Clock pain mgt  . Hypertension   . Hyperlipemia   . Osteoarthritis of left knee     advanced  . Tobacco abuse   . Hypothyroidism   . History of cardiac arrhythmia     cardiologist- Traci Turner  . Esophageal stricture   . Thyroid nodule   . Colon polyp   . Dysrhythmia     IRREG     . GERD (gastroesophageal reflux disease)   . Sleep apnea     10-15 YRS AGO DOES NOT USE [facility]   Past Surgical History  Procedure Laterality Date  . Other surgical history      4 Lumbar surgeries  . Lumbar fusion      and rods  . Cesarean section    . Ankle surgery      left ankle 1995  . Infusion pump implantation      history of implantablet morphine pump  . Partial hysterectomy    . Knee surgery  1991  . Elbow surgery  1990's  . Thyroidectomy      2012  . Cervical spine surgery      2012  . Foot surgery      RT FOOT SPUR REMOVED 2012   . Shoulder surgery      09/2011  LEFT  . Abdominal hysterectomy    . Back surgery      BACK X 4   . Lumbar laminectomy/decompression microdiscectomy N/A 11/28/2012   Procedure: DECOMPRESSIVE LUMBAR LAMINECTOMY LEVEL 1;  Surgeon: Mariam Dollar, MD;  Location: [facility];  Service: Neurosurgery;  Laterality: N/A;  DECOMPRESSIVE LUMBAR LAMINECTOMY LEVEL 1    reports that she has been smoking Cigarettes.  She has a 20 pack-year smoking history. She does not have any smokeless tobacco history on file. She reports that she does not drink alcohol or use illicit drugs. family history includes Cancer in her father, mother, and sister; Coronary artery disease in her mother; Diabetes in her mother and sister; Diabetes type II in her mother; Hypertension in her brother, mother, and sister; and Pancreatic cancer in her father.  There is no history of Other. Allergies  Allergen Reactions  . Amitriptyline Other (See Comments)    Leg swelling  . Gabapentin     REACTION: Itching Ask patient to clarify "Neurogan" on history form dated 09/25/10.  Marland Kitchen Hydrochlorothiazide W-Triamterene     REACTION: rash and itching      Review of Systems  Constitutional: Negative for fever, chills, appetite change and unexpected weight change.  Respiratory: Negative for cough and shortness of breath.   Cardiovascular: Positive for leg swelling. Negative for  chest pain and palpitations.  Gastrointestinal: Negative for abdominal pain.  Neurological: Negative for dizziness and weakness.       Objective:   Physical Exam  Constitutional: She appears well-developed and well-nourished.  Neck: Neck supple. No thyromegaly present.  Cardiovascular: Normal rate and regular rhythm.  Exam reveals no gallop.   Pulmonary/Chest: Effort normal and breath sounds normal. No respiratory distress. She has no wheezes. She has no rales.  Musculoskeletal: She exhibits edema.  Patient's trace pitting edema both legs. Left leg edema slightly worse than right. Good distal foot pulses. Good capillary refill throughout          Assessment & Plan:  Bilateral leg edema left greater than right. Recent workup  as above. Repeat basic metabolic panel. Check albumin. Check urine dipstick to rule out proteinuria.  Continue compression stockings and elevation. Increase Lasix back to 40 mg daily for the next week. Suggested daily weights. Sodium reduction

## 2013-01-16 ENCOUNTER — Other Ambulatory Visit: Payer: Self-pay | Admitting: Internal Medicine

## 2013-01-16 ENCOUNTER — Encounter: Payer: Self-pay | Admitting: Family Medicine

## 2013-01-16 LAB — HEPATIC FUNCTION PANEL
ALT: 9 U/L (ref 0–35)
AST: 15 U/L (ref 0–37)
Albumin: 3.9 g/dL (ref 3.5–5.2)
Alkaline Phosphatase: 72 U/L (ref 39–117)
Bilirubin, Direct: 0 mg/dL (ref 0.0–0.3)
Total Bilirubin: 0.4 mg/dL (ref 0.3–1.2)
Total Protein: 6.8 g/dL (ref 6.0–8.3)

## 2013-01-16 LAB — BASIC METABOLIC PANEL
BUN: 12 mg/dL (ref 6–23)
CO2: 31 mEq/L (ref 19–32)
Calcium: 8.6 mg/dL (ref 8.4–10.5)
Chloride: 99 mEq/L (ref 96–112)
Creatinine, Ser: 0.9 mg/dL (ref 0.4–1.2)
GFR: 68.78 mL/min (ref >60.00)
Glucose, Bld: 108 mg/dL — ABNORMAL HIGH (ref 70–99)
Potassium: 4.2 mEq/L (ref 3.5–5.1)
Sodium: 137 mEq/L (ref 135–145)

## 2013-01-19 ENCOUNTER — Ambulatory Visit: Payer: Medicare Other | Admitting: Physical Therapy

## 2013-01-22 ENCOUNTER — Ambulatory Visit: Payer: Medicare Other | Admitting: Physical Therapy

## 2013-01-26 ENCOUNTER — Ambulatory Visit: Payer: Medicare Other | Admitting: Physical Therapy

## 2013-01-29 ENCOUNTER — Ambulatory Visit: Payer: Medicare Other | Admitting: Physical Therapy

## 2013-01-30 ENCOUNTER — Ambulatory Visit (INDEPENDENT_AMBULATORY_CARE_PROVIDER_SITE_OTHER): Payer: Medicare Other | Admitting: Family Medicine

## 2013-01-30 ENCOUNTER — Encounter: Payer: Self-pay | Admitting: Family Medicine

## 2013-01-30 VITALS — BP 110/66 | HR 72 | Temp 97.9°F | Wt 138.0 lb

## 2013-01-30 DIAGNOSIS — R1011 Right upper quadrant pain: Secondary | ICD-10-CM

## 2013-01-30 DIAGNOSIS — R609 Edema, unspecified: Secondary | ICD-10-CM

## 2013-01-30 DIAGNOSIS — R1012 Left upper quadrant pain: Secondary | ICD-10-CM

## 2013-01-30 DIAGNOSIS — R600 Localized edema: Secondary | ICD-10-CM

## 2013-01-30 NOTE — Patient Instructions (Addendum)
We will call you regarding abdominal ultrasound

## 2013-01-30 NOTE — Progress Notes (Signed)
Subjective:    Patient ID: Joan [name], female    DOB: [birth year], 67 y.o.   MRN: [id number]  HPI  Patient seen for followup regarding her lower extremity edema. Onset around April this year. She's had multiple recent labs including basic metabolic panel, hepatic panel, and urinalysis, thyroid function, BNP which were all normal. Previous venous Dopplers unremarkable. Recent pelvic ultrasound revealed probably benign right ovarian cyst but no worrisome masses or other abnormalities.  Patient's been taking low-dose furosemide 20 mg daily along with HCTZ. This does help her edema slightly though she is very concerned because of her persistent edema issues. She's not had any significant dyspnea. Carefully watches her sodium intake. She's used compression stockings without much improvement. No calcium channel blocker use or other medications likely to be exacerbating.   She expresses great concern today because of the fact that her father died of pancreatic cancer. She's noted some increased bloated feeling upper abdomen but no nausea or vomiting. Appetite stable. Weight is stable and affect somewhat increased which she attributes to the edema issues.  No change in bowel habits. She has occasional constipation but she takes hydrocodone.  Past Medical History  Diagnosis Date  . Chronic low back pain     followed by Dr Vear Clock pain mgt  . Hypertension   . Hyperlipemia   . Osteoarthritis of left knee     advanced  . Tobacco abuse   . Hypothyroidism   . History of cardiac arrhythmia     cardiologist- Traci Turner  . Esophageal stricture   . Thyroid nodule   . Colon polyp   . Dysrhythmia     IRREG     . GERD (gastroesophageal reflux disease)   . Sleep apnea     10-15 YRS AGO DOES NOT USE [facility]   Past Surgical History  Procedure Laterality Date  . Other surgical history      4 Lumbar surgeries  . Lumbar fusion      and rods  . Cesarean section    . Ankle surgery      left ankle  1995  . Infusion pump implantation      history of implantablet morphine pump  . Partial hysterectomy    . Knee surgery  1991  . Elbow surgery  1990's  . Thyroidectomy      2012  . Cervical spine surgery      2012  . Foot surgery      RT FOOT SPUR REMOVED 2012   . Shoulder surgery      09/2011  LEFT  . Abdominal hysterectomy    . Back surgery      BACK X 4   . Lumbar laminectomy/decompression microdiscectomy N/A 11/28/2012    Procedure: DECOMPRESSIVE LUMBAR LAMINECTOMY LEVEL 1;  Surgeon: Mariam Dollar, MD;  Location: [facility];  Service: Neurosurgery;  Laterality: N/A;  DECOMPRESSIVE LUMBAR LAMINECTOMY LEVEL 1    reports that she has been smoking Cigarettes.  She has a 20 pack-year smoking history. She does not have any smokeless tobacco history on file. She reports that she does not drink alcohol or use illicit drugs. family history includes Cancer in her father, mother, and sister; Coronary artery disease in her mother; Diabetes in her mother and sister; Diabetes type II in her mother; Hypertension in her brother, mother, and sister; and Pancreatic cancer in her father.  There is no history of Other. Allergies  Allergen Reactions  . Amitriptyline Other (See Comments)  Leg swelling  . Gabapentin     REACTION: Itching Ask patient to clarify "Neurogan" on history form dated 09/25/10.  Marland Kitchen Hydrochlorothiazide W-Triamterene     REACTION: rash and itching     Review of Systems  Constitutional: Positive for fatigue. Negative for fever, appetite change and unexpected weight change.  Respiratory: Negative for cough and shortness of breath.   Cardiovascular: Negative for chest pain.  Gastrointestinal: Positive for abdominal distention. Negative for nausea, vomiting, diarrhea, constipation and blood in stool.       Objective:   Physical Exam  Constitutional: She appears well-developed and well-nourished.  Cardiovascular: Normal rate.   Pulmonary/Chest: Effort normal and breath  sounds normal.  Abdominal:  Soft and nontender. No hepatomegaly or splenomegaly.  Musculoskeletal: She exhibits edema.  Trace pitting edema involving legs and ankles bilaterally. She has good capillary refill both feet and palpable dorsalis pedis pulses bilaterally          Assessment & Plan:  #1 persistent bilateral leg edema. Suspect largely venous stasis related. Fairly extensive workup recently as above unrevealing. She will continue low-dose furosemide, elevation, and compression. #2 abdominal fullness/bloating. Recent pelvic ultrasound unremarkable. Obtain abdominal ultrasound though she does not have any red flags such as weight loss.

## 2013-02-03 ENCOUNTER — Ambulatory Visit: Payer: Medicare Other | Admitting: Physical Therapy

## 2013-02-04 ENCOUNTER — Ambulatory Visit
Admission: RE | Admit: 2013-02-04 | Discharge: 2013-02-04 | Disposition: A | Discharge status: Home / Self Care | Payer: Medicare Other | Source: Ambulatory Visit | Attending: Family Medicine | Admitting: Family Medicine

## 2013-02-04 DIAGNOSIS — R1011 Right upper quadrant pain: Secondary | ICD-10-CM

## 2013-02-05 ENCOUNTER — Ambulatory Visit: Payer: Medicare Other | Admitting: Physical Therapy

## 2013-02-05 ENCOUNTER — Telehealth: Payer: Self-pay | Admitting: Internal Medicine

## 2013-02-05 NOTE — Telephone Encounter (Signed)
Pt just got the ultrasound done on 02/04/13. Informed patient that as soon as Dr. Caryl Never read the results will call the back with the results.

## 2013-02-05 NOTE — Telephone Encounter (Signed)
Pt would like abd ultrasound results. Dr Caryl Never order u/s

## 2013-02-10 ENCOUNTER — Ambulatory Visit: Payer: Medicare Other | Admitting: Physical Therapy

## 2013-02-11 ENCOUNTER — Other Ambulatory Visit: Payer: Self-pay

## 2013-02-12 ENCOUNTER — Ambulatory Visit: Payer: Medicare Other | Attending: Neurosurgery | Admitting: Physical Therapy

## 2013-02-12 DIAGNOSIS — M6281 Muscle weakness (generalized): Secondary | ICD-10-CM | POA: Insufficient documentation

## 2013-02-12 DIAGNOSIS — R293 Abnormal posture: Secondary | ICD-10-CM | POA: Insufficient documentation

## 2013-02-12 DIAGNOSIS — IMO0001 Reserved for inherently not codable concepts without codable children: Secondary | ICD-10-CM | POA: Insufficient documentation

## 2013-02-12 DIAGNOSIS — M545 Low back pain, unspecified: Secondary | ICD-10-CM | POA: Insufficient documentation

## 2013-02-12 DIAGNOSIS — Z981 Arthrodesis status: Secondary | ICD-10-CM | POA: Insufficient documentation

## 2013-02-16 ENCOUNTER — Other Ambulatory Visit: Payer: Self-pay | Admitting: Internal Medicine

## 2013-02-18 ENCOUNTER — Ambulatory Visit: Payer: Medicare Other | Admitting: Physical Therapy

## 2013-03-03 ENCOUNTER — Telehealth: Payer: Self-pay | Admitting: Internal Medicine

## 2013-03-03 NOTE — Telephone Encounter (Signed)
Pt would like bone density results. Pt has test done at [facility]

## 2013-03-03 NOTE — Telephone Encounter (Signed)
Pt aware.

## 2013-03-03 NOTE — Telephone Encounter (Signed)
Call pt - DEXA scan showed osteopenia.  Continue vit D 3 supplementation and weight bearing exercises.  We can discuss when to repeat DEXA at next OV

## 2013-03-06 ENCOUNTER — Encounter: Payer: Self-pay | Admitting: Internal Medicine

## 2013-03-06 ENCOUNTER — Ambulatory Visit (INDEPENDENT_AMBULATORY_CARE_PROVIDER_SITE_OTHER): Payer: Medicare Other | Admitting: Internal Medicine

## 2013-03-06 VITALS — BP 104/68 | HR 76 | Temp 98.1°F | Wt 133.0 lb

## 2013-03-06 DIAGNOSIS — I10 Essential (primary) hypertension: Secondary | ICD-10-CM

## 2013-03-06 DIAGNOSIS — K8020 Calculus of gallbladder without cholecystitis without obstruction: Secondary | ICD-10-CM | POA: Insufficient documentation

## 2013-03-06 DIAGNOSIS — M899 Disorder of bone, unspecified: Secondary | ICD-10-CM

## 2013-03-06 DIAGNOSIS — Z23 Encounter for immunization: Secondary | ICD-10-CM

## 2013-03-06 DIAGNOSIS — E039 Hypothyroidism, unspecified: Secondary | ICD-10-CM

## 2013-03-06 DIAGNOSIS — M8580 Other specified disorders of bone density and structure, unspecified site: Secondary | ICD-10-CM | POA: Insufficient documentation

## 2013-03-06 DIAGNOSIS — R609 Edema, unspecified: Secondary | ICD-10-CM

## 2013-03-06 MED ORDER — FUROSEMIDE 20 MG PO TABS: 20.0000 mg | ORAL_TABLET | Freq: Every day | ORAL | Status: DC | PRN

## 2013-03-06 NOTE — Assessment & Plan Note (Signed)
Patient has had extensive workup which has been negative. Decrease Lasix to 20 mg as needed. Use compression stockings as directed. Her symptoms likely secondary to venous insufficiency.

## 2013-03-06 NOTE — Assessment & Plan Note (Signed)
Reviewed DEXA scan with patient in detail.  Continue vitamin D 3 2000 units daily and weight bearing exercises as tolerated.

## 2013-03-06 NOTE — Progress Notes (Signed)
Subjective:    Patient ID: Joan [name], female    DOB: [birth year], 67 y.o.   MRN: [id number]  HPI  67 year old white female for followup. Patient reports she is having ongoing issues with low back pain since her surgery. She has put her rehabilitation sessions on hold.  Patient seen by Dr. Caryl Never for her lower extremity edema. She is currently taking Lasix daily. She has lost 6 pounds since previous visit. She feels somewhat "washed out".  There was concern for possible pancreatic cancer due to family history. Abdominal ultrasound was obtained. Visualization of the pancreas was obstructed by bowel gas. Abdominal ultrasound did reveal 3.4 cm gallstone. She has intermittent abdominal bloating.  DEXA scan - it showed osteopenia.    Review of Systems Negative for chest pain or shortness of breath  Past Medical History  Diagnosis Date  . Chronic low back pain     followed by Dr Vear Clock pain mgt  . Hypertension   . Hyperlipemia   . Osteoarthritis of left knee     advanced  . Tobacco abuse   . Hypothyroidism   . History of cardiac arrhythmia     cardiologist- Traci Turner  . Esophageal stricture   . Thyroid nodule   . Colon polyp   . Dysrhythmia     IRREG     . GERD (gastroesophageal reflux disease)   . Sleep apnea     10-15 YRS AGO DOES NOT USE MACH    History   Social History  . Marital Status: Divorced    Spouse Name: N/A    Number of Children: N/A  . Years of Education: N/A   Occupational History  . Not on file.   Social History Main Topics  . Smoking status: Current Every Day Smoker -- 1.00 packs/day for 20 years    Types: Cigarettes  . Smokeless tobacco: Not on file     Comment: 1 ppd - 20 years  . Alcohol Use: No  . Drug Use: No  . Sexual Activity: Not on file   Other Topics Concern  . Not on file   Social History Narrative   Divorced   Current Smoker  1 ppd -  20 yrs      Alcohol use-no       [employer] - laid off      Past Surgical History  Procedure Laterality Date  . Other surgical history      4 Lumbar surgeries  . Lumbar fusion      and rods  . Cesarean section    . Ankle surgery      left ankle 1995  . Infusion pump implantation      history of implantablet morphine pump  . Partial hysterectomy    . Knee surgery  1991  . Elbow surgery  1990's  . Thyroidectomy      2012  . Cervical spine surgery      2012  . Foot surgery      RT FOOT SPUR REMOVED 2012   . Shoulder surgery      09/2011  LEFT  . Abdominal hysterectomy    . Back surgery      BACK X 4   . Lumbar laminectomy/decompression microdiscectomy N/A 11/28/2012    Procedure: DECOMPRESSIVE LUMBAR LAMINECTOMY LEVEL 1;  Surgeon: Mariam Dollar, MD;  Location: [facility];  Service: Neurosurgery;  Laterality: N/A;  DECOMPRESSIVE LUMBAR LAMINECTOMY LEVEL 1    Family History  Problem Relation Age  of Onset  . Pancreatic cancer Father     deceased age 34  . Cancer Father   . Diabetes type II Mother     deceased age 85  . Hypertension Mother   . Coronary artery disease Mother   . Diabetes Mother   . Cancer Mother     Thyroid and Skin  . Diabetes Sister   . Hypertension Sister   . Cancer Sister     Breast  . Other Neg Hx     No family history of  colon cancer  . Hypertension Brother     Allergies  Allergen Reactions  . Amitriptyline Other (See Comments)    Leg swelling  . Gabapentin     REACTION: Itching Ask patient to clarify "Neurogan" on history form dated 09/25/10.  Marland Kitchen Hydrochlorothiazide W-Triamterene     REACTION: rash and itching    Current Outpatient Prescriptions on File Prior to Visit  Medication Sig Dispense Refill  . carisoprodol (SOMA) 350 MG tablet Take 350 mg by mouth 3 (three) times daily as needed for muscle spasms. For muscle spasms      . clonazePAM (KLONOPIN) 1 MG tablet TAKE ONE TABLET BY MOUTH AT BEDTIME AS NEEDED  30 tablet  3  . Cyanocobalamin (VITAMIN B-12) 5000 MCG SUBL Place 1 tablet under  the tongue daily.      . Diclofenac Sodium (PENNSAID) 1.5 % SOLN Place 1 each onto the skin See admin instructions. Patient puts a drop on fingertips and rubs into the skin.      Marland Kitchen estradiol (ESTRACE) 0.5 MG tablet Take 1 tablet by mouth daily.      . hydrochlorothiazide (HYDRODIURIL) 25 MG tablet Take 1 tablet (25 mg total) by mouth daily.  90 tablet  1  . HYDROcodone-acetaminophen (NORCO) 10-325 MG per tablet Take 1 tablet by mouth every 4 (four) hours.       Marland Kitchen levothyroxine (SYNTHROID, LEVOTHROID) 100 MCG tablet Take 1 tablet (100 mcg total) by mouth daily.  90 tablet  1  . Omega-3 Fatty Acids (FISH OIL) 1200 MG CAPS Take 2,400 mg by mouth daily.      Marland Kitchen omeprazole (PRILOSEC) 20 MG capsule Take 1 capsule (20 mg total) by mouth 2 (two) times daily.  180 capsule  3  . potassium chloride SA (KLOR-CON M20) 20 MEQ tablet Take 1 tablet (20 mEq total) by mouth 2 (two) times daily.  180 tablet  3  . simvastatin (ZOCOR) 20 MG tablet Take 1 tablet (20 mg total) by mouth at bedtime.  90 tablet  1   No current facility-administered medications on file prior to visit.    BP 104/68  Pulse 76  Temp(Src) 98.1 F (36.7 C) (Oral)  Wt 133 lb (60.328 kg)  BMI 24.32 kg/m2       Objective:   Physical Exam  Constitutional: She is oriented to person, place, and time. She appears well-developed and well-nourished.  HENT:  Head: Normocephalic and atraumatic.  Cardiovascular: Normal rate, regular rhythm and normal heart sounds.   No murmur heard. Pulmonary/Chest: Effort normal and breath sounds normal. She has no wheezes.  Musculoskeletal:  Trace lower ext edema bilaterally  Neurological: She is alert and oriented to person, place, and time. No cranial nerve deficit.  Skin: Skin is warm and dry.  Psychiatric: She has a normal mood and affect. Her behavior is normal.          Assessment & Plan:

## 2013-03-06 NOTE — Assessment & Plan Note (Signed)
Patient's blood pressure is low normal. I suspect she is slightly volume depleted due to chronic Lasix use. Recommended patient use Lasix only when necessary with significant lower extremity edema. Check BMET. BP: 104/68 mmHg

## 2013-03-06 NOTE — Assessment & Plan Note (Signed)
Lab Results  Component Value Date   TSH 0.81 10/21/2012

## 2013-03-06 NOTE — Assessment & Plan Note (Signed)
Abdominal ultrasound obtained due to complaints of intermittent abdominal bloating. It showed large 3.4 cm gallstone. Refer to surgery for elective cholecystectomy - Dr. Corliss Skains

## 2013-03-07 LAB — BASIC METABOLIC PANEL
BUN: 23 mg/dL (ref 6–23)
CO2: 30 mEq/L (ref 19–32)
Calcium: 8.9 mg/dL (ref 8.4–10.5)
Chloride: 103 mEq/L (ref 96–112)
Creat: 0.77 mg/dL (ref 0.50–1.10)
Glucose, Bld: 104 mg/dL — ABNORMAL HIGH (ref 70–99)
Potassium: 3.9 mEq/L (ref 3.5–5.3)
Sodium: 139 mEq/L (ref 135–145)

## 2013-03-07 LAB — TSH: TSH: 0.099 u[IU]/mL — ABNORMAL LOW (ref 0.350–4.500)

## 2013-03-11 ENCOUNTER — Telehealth: Payer: Self-pay | Admitting: Internal Medicine

## 2013-03-11 ENCOUNTER — Other Ambulatory Visit: Payer: Self-pay | Admitting: *Deleted

## 2013-03-11 DIAGNOSIS — E785 Hyperlipidemia, unspecified: Secondary | ICD-10-CM

## 2013-03-11 MED ORDER — LEVOTHYROXINE SODIUM 88 MCG PO TABS: 88.0000 ug | ORAL_TABLET | Freq: Every day | ORAL | Status: DC

## 2013-03-11 MED ORDER — SIMVASTATIN 20 MG PO TABS: 20.0000 mg | ORAL_TABLET | Freq: Every day | ORAL | Status: DC

## 2013-03-11 NOTE — Telephone Encounter (Signed)
rx sent in to [facility]

## 2013-03-11 NOTE — Addendum Note (Signed)
Addended by: Earle Gell C on: 03/11/2013 01:00 PM   Modules accepted: Orders

## 2013-03-11 NOTE — Progress Notes (Signed)
Quick Note:  Pt request to have 2 week supply sent to [facility] in [facility]. Pt does not want 90 day supply sent to [facility] until she checks to make sure the prescription is covered. Advised pt to call back to have rx sent to [facility]. Pt would also like to know how she is getting too much thyroid medication when she does not have a thyroid. Pls advise. ______

## 2013-03-11 NOTE — Progress Notes (Signed)
Quick Note:  Called and spoke with pt and pt is aware. ______ 

## 2013-03-11 NOTE — Telephone Encounter (Signed)
Pt states thyroid medication was recently changed by PCP.  She only requested a short term supply pending verification from insurance that it would be covered at a reasonable price.  Pt states she has spoken with [employer] and the medication is the same tier as previous medication she was taking.  Please send 90 day supply to [facility].

## 2013-03-12 ENCOUNTER — Other Ambulatory Visit: Payer: Self-pay | Admitting: *Deleted

## 2013-03-12 MED ORDER — LEVOTHYROXINE SODIUM 88 MCG PO TABS: 88.0000 ug | ORAL_TABLET | Freq: Every day | ORAL | Status: DC

## 2013-03-16 ENCOUNTER — Ambulatory Visit (INDEPENDENT_AMBULATORY_CARE_PROVIDER_SITE_OTHER): Payer: Medicare Other | Admitting: Surgery

## 2013-03-16 ENCOUNTER — Encounter (INDEPENDENT_AMBULATORY_CARE_PROVIDER_SITE_OTHER): Payer: Self-pay | Admitting: Surgery

## 2013-03-16 VITALS — BP 110/62 | HR 68 | Resp 14 | Ht 62.0 in | Wt 135.0 lb

## 2013-03-16 DIAGNOSIS — K8010 Calculus of gallbladder with chronic cholecystitis without obstruction: Secondary | ICD-10-CM

## 2013-03-16 NOTE — Progress Notes (Signed)
Patient ID: Joan [name], female   DOB: [birth year], 67 y.o.   MRN: [id number]  Chief Complaint  Patient presents with  . New Evaluation    eval GB    HPI Joan [name] is a 67 y.o. female. Referred by Dr. Artist Pais for evaluation of gallbladder disease   HPI This is a 67 year old female who presents with a three-month history of intermittent abdominal pain and bloating. This is located in her epigastrium and to the right side. There is some radiation around her right side to her back. She reports some nausea and has noticed that her abdomen seems much more bloated than usual. She had a workup included an ultrasound which showed a 3.4 cm gallstone but no subtle wall thickening. She presents now for surgical evaluation. Past Medical History  Diagnosis Date  . Chronic low back pain     followed by Dr Vear Clock pain mgt  . Hypertension   . Hyperlipemia   . Osteoarthritis of left knee     advanced  . Tobacco abuse   . Hypothyroidism   . History of cardiac arrhythmia     cardiologist- Traci Turner  . Esophageal stricture   . Thyroid nodule   . Colon polyp   . Dysrhythmia     IRREG     . GERD (gastroesophageal reflux disease)   . Sleep apnea     10-15 YRS AGO DOES NOT USE [facility]    Past Surgical History  Procedure Laterality Date  . Other surgical history      4 Lumbar surgeries  . Lumbar fusion      and rods  . Cesarean section    . Ankle surgery      left ankle 1995  . Infusion pump implantation      history of implantablet morphine pump  . Partial hysterectomy    . Knee surgery  1991  . Elbow surgery  1990's  . Thyroidectomy      2012  . Cervical spine surgery      2012  . Foot surgery      RT FOOT SPUR REMOVED 2012   . Shoulder surgery      09/2011  LEFT  . Abdominal hysterectomy    . Back surgery      BACK X 4   . Lumbar laminectomy/decompression microdiscectomy N/A 11/28/2012    Procedure: DECOMPRESSIVE LUMBAR LAMINECTOMY LEVEL 1;  Surgeon: Mariam Dollar, MD;   Location: [facility];  Service: Neurosurgery;  Laterality: N/A;  DECOMPRESSIVE LUMBAR LAMINECTOMY LEVEL 1    Family History  Problem Relation Age of Onset  . Pancreatic cancer Father     deceased age 26  . Cancer Father   . Diabetes type II Mother     deceased age 68  . Hypertension Mother   . Coronary artery disease Mother   . Diabetes Mother   . Cancer Mother     Thyroid and Skin  . Diabetes Sister   . Hypertension Sister   . Cancer Sister     Breast  . Other Neg Hx     No family history of  colon cancer  . Hypertension Brother     Social History History  Substance Use Topics  . Smoking status: Current Every Day Smoker -- 1.00 packs/day for 20 years    Types: Cigarettes  . Smokeless tobacco: Not on file     Comment: 1 ppd - 20 years  . Alcohol Use: No  Allergies  Allergen Reactions  . Amitriptyline Other (See Comments)    Leg swelling  . Gabapentin     REACTION: Itching Ask patient to clarify "Neurogan" on history form dated 09/25/10.  Marland Kitchen Hydrochlorothiazide W-Triamterene     REACTION: rash and itching    Current Outpatient Prescriptions  Medication Sig Dispense Refill  . bisacodyl (DULCOLAX) 5 MG EC tablet Take 2 tablets (10 mg total) by mouth at bedtime.  30 tablet    . carisoprodol (SOMA) 350 MG tablet Take 350 mg by mouth 3 (three) times daily as needed for muscle spasms. For muscle spasms      . Cholecalciferol (HM VITAMIN D3) 2000 UNITS CAPS Take 1 capsule by mouth daily.      . clonazePAM (KLONOPIN) 1 MG tablet TAKE ONE TABLET BY MOUTH AT BEDTIME AS NEEDED  30 tablet  3  . Cyanocobalamin (VITAMIN B-12) 5000 MCG SUBL Place 1 tablet under the tongue daily.      . Diclofenac Sodium (PENNSAID) 1.5 % SOLN Place 1 each onto the skin See admin instructions. Patient puts a drop on fingertips and rubs into the skin.      Marland Kitchen estradiol (ESTRACE) 0.5 MG tablet Take 1 tablet by mouth daily.      . furosemide (LASIX) 20 MG tablet Take 1 tablet (20 mg total) by mouth  daily as needed.  60 tablet  3  . hydrochlorothiazide (HYDRODIURIL) 25 MG tablet Take 1 tablet (25 mg total) by mouth daily.  90 tablet  1  . HYDROcodone-acetaminophen (NORCO) 10-325 MG per tablet Take 1 tablet by mouth every 4 (four) hours.       Marland Kitchen levothyroxine (SYNTHROID, LEVOTHROID) 88 MCG tablet Take 1 tablet (88 mcg total) by mouth daily.  14 tablet  0  . Melatonin 5 MG CAPS Take 5 mg by mouth daily.      . Omega-3 Fatty Acids (FISH OIL) 1200 MG CAPS Take 2,400 mg by mouth daily.      Marland Kitchen omeprazole (PRILOSEC) 20 MG capsule Take 1 capsule (20 mg total) by mouth 2 (two) times daily.  180 capsule  3  . potassium chloride SA (KLOR-CON M20) 20 MEQ tablet Take 1 tablet (20 mEq total) by mouth 2 (two) times daily.  180 tablet  3  . simvastatin (ZOCOR) 20 MG tablet Take 1 tablet (20 mg total) by mouth at bedtime.  90 tablet  1   No current facility-administered medications for this visit.    Review of Systems Review of Systems  Constitutional: Negative for fever, chills and unexpected weight change.  HENT: Negative for hearing loss, congestion, sore throat, trouble swallowing and voice change.   Eyes: Negative for visual disturbance.  Respiratory: Negative for cough and wheezing.   Cardiovascular: Negative for chest pain, palpitations and leg swelling.  Gastrointestinal: Positive for nausea, abdominal pain, diarrhea and abdominal distention. Negative for vomiting, constipation, blood in stool and anal bleeding.  Genitourinary: Negative for hematuria, vaginal bleeding and difficulty urinating.  Musculoskeletal: Negative for arthralgias.  Skin: Negative for rash and wound.  Neurological: Negative for seizures, syncope and headaches.  Hematological: Negative for adenopathy. Does not bruise/bleed easily.  Psychiatric/Behavioral: Negative for confusion.    Blood pressure 110/62, pulse 68, resp. rate 14, height 5\' 2"  (1.575 m), weight 135 lb (61.236 kg).  Physical Exam Physical Exam WDWN in  NAD HEENT:  EOMI, sclera anicteric Neck:  No masses, no thyromegaly Lungs:  CTA bilaterally; normal respiratory effort CV:  Regular rate and rhythm;  no murmurs Abd:  +bowel sounds, soft, minimal RUQ tenderness; no palpable masses Ext:  Well-perfused; no edema Skin:  Warm, dry; no sign of jaundice  Data Reviewed Lab Results  Component Value Date   WBC 5.0 11/21/2012   HGB 12.9 11/21/2012   HCT 37.8 11/21/2012   MCV 87.5 11/21/2012   PLT 214 11/21/2012   Lab Results  Component Value Date   ALT 9 01/15/2013   AST 15 01/15/2013   ALKPHOS 72 01/15/2013   BILITOT 0.4 01/15/2013   RADIOLOGY REPORT*  Clinical Data: Abdominal bloating and discomfort, smoking history  COMPLETE ABDOMINAL ULTRASOUND  Comparison: None.  Findings:  Gallbladder: The gallbladder is visualized and there is a large  gallstone of 3.4 cm in diameter with acoustical shadowing. An  additional small echogenic focus does not appear to move and may  represent a polyp of 7 mm. In addition there is comet-tail  artifact consistent with adenomyomatosis of the gallbladder. There  is no pain over the gallbladder with compression.  Common bile duct: The common bile duct is normal measuring 6.3 mm  in diameter.  Liver: The liver has a normal echogenic pattern. No focal  abnormality is seen.  IVC: Appears normal.  Pancreas: The pancreas is largely obscured by bowel gas.  Spleen: The spleen is normal measuring 5.8 cm sagittally.  Right Kidney: No hydronephrosis is seen. The right kidney  measures 10.8 cm sagittally.  Left Kidney: No hydronephrosis is noted. The left kidney measures  11.1 cm.  Abdominal aorta: The abdominal aorta is normal in caliber.  IMPRESSION:  1. Single large gallstone of 3.4 cm in diameter. No pain over the  gallbladder with compression.  2. Changes of adenomyomatosis of the gallbladder.  3. Probable 7 mm gallbladder polyp.  4. The pancreas is obscured by bowel gas.  Original Report Authenticated By:  Dwyane Dee, M.D.   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 The surgical procedure has been discussed with the patient.  Potential risks, benefits, alternative treatments, and expected outcomes have been explained.  All of the patient's questions at this time have been answered.  The likelihood of reaching the patient's treatment goal is good.  The patient understand the proposed surgical procedure and wishes to proceed.         Elanor Cale K. 03/16/2013, 2:30 PM

## 2013-03-18 ENCOUNTER — Other Ambulatory Visit: Payer: Self-pay

## 2013-03-18 DIAGNOSIS — Z1231 Encounter for screening mammogram for malignant neoplasm of breast: Secondary | ICD-10-CM

## 2013-04-07 ENCOUNTER — Ambulatory Visit
Admission: RE | Admit: 2013-04-07 | Discharge: 2013-04-07 | Disposition: A | Discharge status: Home / Self Care | Payer: Medicare Other | Source: Ambulatory Visit

## 2013-04-07 ENCOUNTER — Encounter (HOSPITAL_COMMUNITY): Payer: Self-pay | Admitting: Pharmacy Technician

## 2013-04-07 DIAGNOSIS — Z1231 Encounter for screening mammogram for malignant neoplasm of breast: Secondary | ICD-10-CM

## 2013-04-08 ENCOUNTER — Encounter (HOSPITAL_COMMUNITY): Payer: Self-pay

## 2013-04-08 ENCOUNTER — Encounter (HOSPITAL_COMMUNITY)
Admission: RE | Admit: 2013-04-08 | Discharge: 2013-04-08 | Disposition: A | Discharge status: Home / Self Care | Payer: Medicare Other | Source: Ambulatory Visit | Attending: Surgery | Admitting: Surgery

## 2013-04-08 ENCOUNTER — Other Ambulatory Visit (HOSPITAL_COMMUNITY): Payer: Self-pay | Admitting: *Deleted

## 2013-04-08 DIAGNOSIS — Z01812 Encounter for preprocedural laboratory examination: Secondary | ICD-10-CM | POA: Insufficient documentation

## 2013-04-08 DIAGNOSIS — Z01818 Encounter for other preprocedural examination: Secondary | ICD-10-CM | POA: Insufficient documentation

## 2013-04-08 LAB — CBC
HCT: 39.2 % (ref 36.0–46.0)
Hemoglobin: 13.4 g/dL (ref 12.0–15.0)
MCH: 30.5 pg (ref 26.0–34.0)
MCHC: 34.2 g/dL (ref 30.0–36.0)
MCV: 89.3 fL (ref 78.0–100.0)
Platelets: 245 10*3/uL (ref 150–400)
RBC: 4.39 MIL/uL (ref 3.87–5.11)
RDW: 13.4 % (ref 11.5–15.5)
WBC: 4.5 10*3/uL (ref 4.0–10.5)

## 2013-04-08 LAB — BASIC METABOLIC PANEL
BUN: 18 mg/dL (ref 6–23)
CO2: 29 mEq/L (ref 19–32)
Calcium: 8.7 mg/dL (ref 8.4–10.5)
Chloride: 100 mEq/L (ref 96–112)
Creatinine, Ser: 0.67 mg/dL (ref 0.50–1.10)
GFR calc Af Amer: >90 mL/min (ref >90)
GFR calc non Af Amer: 88 mL/min — ABNORMAL LOW (ref >90)
Glucose, Bld: 113 mg/dL — ABNORMAL HIGH (ref 70–99)
Potassium: 4 mEq/L (ref 3.5–5.1)
Sodium: 137 mEq/L (ref 135–145)

## 2013-04-08 NOTE — Progress Notes (Signed)
10-21-12 ekg epic Chest xray 2 view 11-21-12 epic

## 2013-04-08 NOTE — Patient Instructions (Addendum)
20 Joan [name]  04/08/2013   Your procedure is scheduled on: 04-16-2013  Report to [facility] at 530  AM.  Call this number if you have problems the morning of surgery [phone]   Remember:   Do not eat food or drink liquids :After Midnight.     Take these medicines the morning of surgery with A SIP OF WATER: hydrocodone, carisoprodol, levothryoxine, omeprazole                                SEE [facility] PREPARING FOR SURGERY SHEET             You may not have any metal on your body including hair pins and piercings  Do not wear jewelry, make-up.  Do not wear lotions, powders, or perfumes. You may wear deodorant.   Men may shave face and neck.  Do not bring valuables to the hospital. [facility] IS NOT RESPONSIBLE FOR VALUEABLES.  Contacts, dentures or bridgework may not be worn into surgery.  Leave suitcase in the car. After surgery it may be brought to your room.  For patients admitted to the hospital, checkout time is 11:00 AM the day of discharge.   Patients discharged the day of surgery will not be allowed to drive home.  Name and phone number of your driver:sister darlene bullins [phone] cell  Special Instructions: N/A   Please read over the following fact sheets that you were given:   Call Cain Sieve RN pre op nurse if needed [phone]    FAILURE TO FOLLOW THESE INSTRUCTIONS MAY RESULT IN THE CANCELLATION OF YOUR SURGERY.  PATIENT SIGNATURE___________________________________________  NURSE SIGNATURE_____________________________________________

## 2013-04-08 NOTE — Progress Notes (Signed)
11-21-2012 chest xray epic 10-21-2012 ekg epic

## 2013-04-15 NOTE — Anesthesia Preprocedure Evaluation (Addendum)
Anesthesia Evaluation  Patient identified by MRN, date of birth, ID band Patient awake    Reviewed: Allergy & Precautions, H&P , NPO status , Patient's Chart, lab work & pertinent test results  Airway Mallampati: II TM Distance: >3 FB Neck ROM: full    Dental  (+) Dental Advisory Given   Pulmonary sleep apnea , Current Smoker,  breath sounds clear to auscultation  Pulmonary exam normal       Cardiovascular hypertension, Pt. on medications Rhythm:Regular Rate:Normal  Irregular heart beat 2008   Neuro/Psych PSYCHIATRIC DISORDERS Anxiety negative neurological ROS     GI/Hepatic Neg liver ROS, GERD-  Medicated,  Endo/Other  Hypothyroidism   Renal/GU negative Renal ROS     Musculoskeletal negative musculoskeletal ROS (+) Arthritis -,   Abdominal   Peds  Hematology negative hematology ROS (+)   Anesthesia Other Findings   Reproductive/Obstetrics negative OB ROS                        Anesthesia Physical  Anesthesia Plan  ASA: III  Anesthesia Plan: General   Post-op Pain Management:    Induction: Intravenous  Airway Management Planned: Oral ETT  Additional Equipment:   Intra-op Plan:   Post-operative Plan: Extubation in OR  Informed Consent: I have reviewed the patients History and Physical, chart, labs and discussed the procedure including the risks, benefits and alternatives for the proposed anesthesia with the patient or authorized representative who has indicated his/her understanding and acceptance.   Dental advisory given  Plan Discussed with: CRNA and Surgeon  Anesthesia Plan Comments:       Anesthesia Quick Evaluation

## 2013-04-16 ENCOUNTER — Ambulatory Visit (HOSPITAL_COMMUNITY): Payer: Medicare Other | Admitting: Anesthesiology

## 2013-04-16 ENCOUNTER — Ambulatory Visit (HOSPITAL_COMMUNITY): Payer: Medicare Other

## 2013-04-16 ENCOUNTER — Ambulatory Visit (HOSPITAL_COMMUNITY)
Admission: RE | Admit: 2013-04-16 | Discharge: 2013-04-16 | Disposition: A | Discharge status: Home / Self Care | Payer: Medicare Other | Source: Ambulatory Visit | Attending: Surgery | Admitting: Surgery

## 2013-04-16 ENCOUNTER — Encounter (HOSPITAL_COMMUNITY): Payer: Self-pay

## 2013-04-16 ENCOUNTER — Encounter (HOSPITAL_COMMUNITY)
Admission: RE | Disposition: A | Discharge status: Home / Self Care | Payer: Self-pay | Source: Ambulatory Visit | Attending: Surgery

## 2013-04-16 ENCOUNTER — Encounter (HOSPITAL_COMMUNITY): Payer: Medicare Other | Admitting: Anesthesiology

## 2013-04-16 DIAGNOSIS — I10 Essential (primary) hypertension: Secondary | ICD-10-CM | POA: Insufficient documentation

## 2013-04-16 DIAGNOSIS — K219 Gastro-esophageal reflux disease without esophagitis: Secondary | ICD-10-CM | POA: Insufficient documentation

## 2013-04-16 DIAGNOSIS — G4730 Sleep apnea, unspecified: Secondary | ICD-10-CM | POA: Insufficient documentation

## 2013-04-16 DIAGNOSIS — K8010 Calculus of gallbladder with chronic cholecystitis without obstruction: Secondary | ICD-10-CM

## 2013-04-16 DIAGNOSIS — E039 Hypothyroidism, unspecified: Secondary | ICD-10-CM | POA: Insufficient documentation

## 2013-04-16 DIAGNOSIS — K660 Peritoneal adhesions (postprocedural) (postinfection): Secondary | ICD-10-CM | POA: Insufficient documentation

## 2013-04-16 HISTORY — PX: CHOLECYSTECTOMY: SHX55

## 2013-04-16 HISTORY — PX: LAPAROSCOPIC LYSIS OF ADHESIONS: SHX5905

## 2013-04-16 SURGERY — LAPAROSCOPIC CHOLECYSTECTOMY WITH INTRAOPERATIVE CHOLANGIOGRAM
Anesthesia: General | Site: Abdomen | Wound class: Clean Contaminated

## 2013-04-16 MED ORDER — PROMETHAZINE HCL 12.5 MG PO TABS: 12.5000 mg | ORAL_TABLET | Freq: Four times a day (QID) | ORAL | Status: DC | PRN

## 2013-04-16 MED ORDER — CEFAZOLIN SODIUM-DEXTROSE 2-3 GM-% IV SOLR
INTRAVENOUS | Status: AC
  Filled 2013-04-16: qty 50

## 2013-04-16 MED ORDER — HYDROMORPHONE HCL PF 1 MG/ML IJ SOLN
0.2500 mg | INTRAMUSCULAR | Status: DC | PRN
  Administered 2013-04-16 (×4): 0.5 mg via INTRAVENOUS

## 2013-04-16 MED ORDER — BUPIVACAINE-EPINEPHRINE 0.25% -1:200000 IJ SOLN
INTRAMUSCULAR | Status: AC
  Filled 2013-04-16: qty 1

## 2013-04-16 MED ORDER — OXYCODONE-ACETAMINOPHEN 5-325 MG PO TABS: 1.0000 | ORAL_TABLET | ORAL | Status: DC | PRN

## 2013-04-16 MED ORDER — OXYCODONE HCL 5 MG/5ML PO SOLN
5.0000 mg | Freq: Once | ORAL | Status: AC | PRN
  Filled 2013-04-16: qty 5

## 2013-04-16 MED ORDER — BUPIVACAINE-EPINEPHRINE 0.25% -1:200000 IJ SOLN
INTRAMUSCULAR | Status: DC | PRN
  Administered 2013-04-16: 17 mL

## 2013-04-16 MED ORDER — OXYCODONE HCL 5 MG PO TABS
5.0000 mg | ORAL_TABLET | Freq: Once | ORAL | Status: AC | PRN
  Administered 2013-04-16: 5 mg via ORAL
  Filled 2013-04-16: qty 1

## 2013-04-16 MED ORDER — MEPERIDINE HCL 50 MG/ML IJ SOLN: 6.2500 mg | INTRAMUSCULAR | Status: DC | PRN

## 2013-04-16 MED ORDER — FENTANYL CITRATE 0.05 MG/ML IJ SOLN
INTRAMUSCULAR | Status: DC | PRN
  Administered 2013-04-16: 100 ug via INTRAVENOUS

## 2013-04-16 MED ORDER — PROPOFOL 10 MG/ML IV BOLUS
INTRAVENOUS | Status: DC | PRN
  Administered 2013-04-16: 130 mg via INTRAVENOUS

## 2013-04-16 MED ORDER — ONDANSETRON HCL 4 MG/2ML IJ SOLN
INTRAMUSCULAR | Status: DC | PRN
  Administered 2013-04-16: 4 mg via INTRAMUSCULAR

## 2013-04-16 MED ORDER — DEXAMETHASONE SODIUM PHOSPHATE 10 MG/ML IJ SOLN
INTRAMUSCULAR | Status: DC | PRN
  Administered 2013-04-16: 10 mg via INTRAVENOUS

## 2013-04-16 MED ORDER — HYDROMORPHONE HCL PF 1 MG/ML IJ SOLN
INTRAMUSCULAR | Status: AC
  Filled 2013-04-16: qty 1

## 2013-04-16 MED ORDER — LACTATED RINGERS IV SOLN
INTRAVENOUS | Status: DC | PRN
  Administered 2013-04-16 (×2): via INTRAVENOUS

## 2013-04-16 MED ORDER — GLYCOPYRROLATE 0.2 MG/ML IJ SOLN
INTRAMUSCULAR | Status: DC | PRN
  Administered 2013-04-16: 0.6 mg via INTRAVENOUS

## 2013-04-16 MED ORDER — NEOSTIGMINE METHYLSULFATE 1 MG/ML IJ SOLN
INTRAMUSCULAR | Status: DC | PRN
  Administered 2013-04-16: 4 mg via INTRAVENOUS

## 2013-04-16 MED ORDER — LACTATED RINGERS IV SOLN: INTRAVENOUS | Status: DC

## 2013-04-16 MED ORDER — CEFAZOLIN SODIUM-DEXTROSE 2-3 GM-% IV SOLR
2.0000 g | INTRAVENOUS | Status: AC
  Administered 2013-04-16: 2 g via INTRAVENOUS

## 2013-04-16 MED ORDER — MIDAZOLAM HCL 5 MG/5ML IJ SOLN
INTRAMUSCULAR | Status: DC | PRN
  Administered 2013-04-16: 2 mg via INTRAVENOUS

## 2013-04-16 MED ORDER — PROMETHAZINE HCL 25 MG/ML IJ SOLN: 6.2500 mg | INTRAMUSCULAR | Status: DC | PRN

## 2013-04-16 MED ORDER — CISATRACURIUM BESYLATE (PF) 10 MG/5ML IV SOLN
INTRAVENOUS | Status: DC | PRN
  Administered 2013-04-16: 7 mg via INTRAVENOUS

## 2013-04-16 SURGICAL SUPPLY — 38 items
APPLIER CLIP ROT 10 11.4 M/L (STAPLE) ×2
BENZOIN TINCTURE PRP APPL 2/3 (GAUZE/BANDAGES/DRESSINGS) ×2 IMPLANT
CANISTER SUCTION 2500CC (MISCELLANEOUS) ×2 IMPLANT
CHLORAPREP W/TINT 26ML (MISCELLANEOUS) ×2 IMPLANT
CLIP APPLIE ROT 10 11.4 M/L (STAPLE) ×1 IMPLANT
COVER MAYO STAND STRL (DRAPES) ×2 IMPLANT
DECANTER SPIKE VIAL GLASS SM (MISCELLANEOUS) ×2 IMPLANT
DRAPE C-ARM 42X120 X-RAY (DRAPES) ×2 IMPLANT
DRAPE LAPAROSCOPIC ABDOMINAL (DRAPES) ×2 IMPLANT
DRAPE UTILITY XL STRL (DRAPES) ×2 IMPLANT
DRSG TEGADERM 2-3/8X2-3/4 SM (GAUZE/BANDAGES/DRESSINGS) ×6 IMPLANT
DRSG TEGADERM 4X4.75 (GAUZE/BANDAGES/DRESSINGS) ×2 IMPLANT
ELECT REM PT RETURN 9FT ADLT (ELECTROSURGICAL) ×2
ELECTRODE REM PT RTRN 9FT ADLT (ELECTROSURGICAL) ×1 IMPLANT
FILTER SMOKE EVAC LAPAROSHD (FILTER) ×2 IMPLANT
GLOVE BIO SURGEON STRL SZ7 (GLOVE) ×2 IMPLANT
GLOVE BIOGEL PI IND STRL 7.5 (GLOVE) ×2 IMPLANT
GLOVE BIOGEL PI INDICATOR 7.5 (GLOVE) ×2
GOWN PREVENTION PLUS LG XLONG (DISPOSABLE) ×2 IMPLANT
GOWN STRL REIN XL XLG (GOWN DISPOSABLE) ×4 IMPLANT
KIT BASIN OR (CUSTOM PROCEDURE TRAY) ×2 IMPLANT
NS IRRIG 1000ML POUR BTL (IV SOLUTION) ×2 IMPLANT
POUCH SPECIMEN RETRIEVAL 10MM (ENDOMECHANICALS) ×2 IMPLANT
RINGERS IRRIG 1000ML POUR BTL (IV SOLUTION) ×2 IMPLANT
SCISSORS ENDO CVD 5DCS (MISCELLANEOUS) ×2 IMPLANT
SET CHOLANGIOGRAPH MIX (MISCELLANEOUS) ×2 IMPLANT
SET IRRIG TUBING LAPAROSCOPIC (IRRIGATION / IRRIGATOR) ×2 IMPLANT
SOLUTION ANTI FOG 6CC (MISCELLANEOUS) ×2 IMPLANT
STRIP CLOSURE SKIN 1/2X4 (GAUZE/BANDAGES/DRESSINGS) ×2 IMPLANT
SUT MNCRL AB 4-0 PS2 18 (SUTURE) ×2 IMPLANT
SYR 20CC LL (SYRINGE) IMPLANT
TOWEL OR 17X26 10 PK STRL BLUE (TOWEL DISPOSABLE) ×2 IMPLANT
TOWEL OR NON WOVEN STRL DISP B (DISPOSABLE) ×2 IMPLANT
TRAY LAP CHOLE (CUSTOM PROCEDURE TRAY) ×2 IMPLANT
TROCAR BLADELESS OPT 5 75 (ENDOMECHANICALS) ×2 IMPLANT
TROCAR XCEL BLUNT TIP 100MML (ENDOMECHANICALS) ×2 IMPLANT
TROCAR XCEL NON-BLD 11X100MML (ENDOMECHANICALS) ×2 IMPLANT
TUBING INSUFFLATION 10FT LAP (TUBING) ×2 IMPLANT

## 2013-04-16 NOTE — Op Note (Signed)
Laparoscopic Cholecystectomy/ laparoscopic lysis of adhesions Procedure Note  Indications: This patient presents with symptomatic gallbladder disease and will undergo laparoscopic cholecystectomy.  Pre-operative Diagnosis: Calculus of gallbladder with other cholecystitis, without mention of obstruction  Post-operative Diagnosis: Same  Surgeon: Antinette Keough K.   Assistants: PA - student  Anesthesia: General endotracheal anesthesia  ASA Class: 2  Procedure Details  The patient was seen again in the Holding Room. The risks, benefits, complications, treatment options, and expected outcomes were discussed with the patient. The possibilities of reaction to medication, pulmonary aspiration, perforation of viscus, bleeding, recurrent infection, finding a normal gallbladder, the need for additional procedures, failure to diagnose a condition, the possible need to convert to an open procedure, and creating a complication requiring transfusion or operation were discussed with the patient. The likelihood of improving the patient's symptoms with return to their baseline status is good.  The patient and/or family concurred with the proposed plan, giving informed consent. The site of surgery properly noted. The patient was taken to Operating Room, identified as Joan [name] and the procedure verified as Laparoscopic Cholecystectomy with Intraoperative Cholangiogram. A Time Out was held and the above information confirmed.  Prior to the induction of general anesthesia, antibiotic prophylaxis was administered. General endotracheal anesthesia was then administered and tolerated well. After the induction, the abdomen was prepped with Chloraprep and draped in sterile fashion. The patient was positioned in the supine position.  Local anesthetic agent was injected into the skin near the umbilicus and an incision made. We dissected down to the abdominal fascia with blunt dissection.  The fascia was incised  vertically and we entered the peritoneal cavity bluntly.  A pursestring suture of 0-Vicryl was placed around the fascial opening.  The Hasson cannula was inserted and secured with the stay suture.  Pneumoperitoneum was then created with CO2 and tolerated well without any adverse changes in the patient's vital signs.  The laparoscope was inserted and we encountered significant omental adhesions.  We placed a 5 mm optiview port in the subxiphoid position.  We were able to visualize significant adhesions to the abdominal wall near the umbilicus. Two 5-mm ports were placed in the right upper quadrant. All skin incisions were infiltrated with a local anesthetic agent before making the incision and placing the trocars. We used scissors to take down the adhesions around the umbilical port.  We then converted the subxiphoid port to an 11 mm port.  We positioned the patient in reverse Trendelenburg, tilted slightly to the patient's left.  The gallbladder was identified, the fundus grasped and retracted cephalad. Adhesions were lysed bluntly and with the electrocautery where indicated, taking care not to injure any adjacent organs or viscus. The infundibulum was grasped and retracted laterally, exposing the peritoneum overlying the triangle of Calot. This was then divided and exposed in a blunt fashion. The cystic duct was clearly identified and bluntly dissected circumferentially. A critical view of the cystic duct and cystic artery was obtained.  We attempted a cholangiogram, but the cystic duct was too small to allow the passage of the [facility] catheter.  The cystic duct was then ligated with clips and divided. The cystic artery was, dissected free, ligated with clips and divided as well.   The gallbladder was dissected from the liver bed in retrograde fashion with the electrocautery. The gallbladder was removed and placed in an Endocatch sac. The liver bed was irrigated and inspected. Hemostasis was achieved with the  electrocautery. Copious irrigation was utilized and was repeatedly  aspirated until clear.  The gallbladder and Endocatch sac were then removed through the umbilical port site.  The pursestring suture was used to close the umbilical fascia.    We again inspected the right upper quadrant for hemostasis.  Pneumoperitoneum was released as we removed the trocars.  4-0 Monocryl was used to close the skin.   Benzoin, steri-strips, and clean dressings were applied. The patient was then extubated and brought to the recovery room in stable condition. Instrument, sponge, and needle counts were correct at closure and at the conclusion of the case.   Findings: Cholecystitis with Cholelithiasis  Estimated Blood Loss: Minimal         Drains: none         Specimens: Gallbladder           Complications: None; patient tolerated the procedure well.         Disposition: PACU - hemodynamically stable.         Condition: stable  Wilmon Arms. Corliss Skains, MD, [facility] Surgery  General/ Trauma Surgery  04/16/2013 8:59 AM

## 2013-04-16 NOTE — Transfer of Care (Signed)
Immediate Anesthesia Transfer of Care Note  Patient: Joan [name]  Procedure(s) Performed: Procedure(s): LAPAROSCOPIC CHOLECYSTECTOMY WITH INTRAOPERATIVE CHOLANGIOGRAM (N/A)  Patient Location: PACU  Anesthesia Type:General  Level of Consciousness: awake, sedated and patient cooperative  Airway & Oxygen Therapy: Patient Spontanous Breathing and Patient connected to face mask oxygen  Post-op Assessment: Report given to PACU RN and Post -op Vital signs reviewed and stable  Post vital signs: Reviewed and stable  Complications: No apparent anesthesia complications

## 2013-04-16 NOTE — Preoperative (Signed)
Beta Blockers   Reason not to administer Beta Blockers:Not Applicable 

## 2013-04-16 NOTE — H&P (Signed)
Patient ID: Joan [name], female DOB: 02-04-1946, 67 y.o. MRN: [id number]  Chief Complaint   Patient presents with   .  New Evaluation     eval GB   HPI  Joan [name] is a 67 y.o. female. Referred by Dr. Artist Pais for evaluation of gallbladder disease  HPI  This is a 67 year old female who presents with a three-month history of intermittent abdominal pain and bloating. This is located in her epigastrium and to the right side. There is some radiation around her right side to her back. She reports some nausea and has noticed that her abdomen seems much more bloated than usual. She had a workup included an ultrasound which showed a 3.4 cm gallstone but no subtle wall thickening. She presents now for surgical evaluation.  Past Medical History   Diagnosis  Date   .  Chronic low back pain      followed by Dr Vear Clock pain mgt   .  Hypertension    .  Hyperlipemia    .  Osteoarthritis of left knee      advanced   .  Tobacco abuse    .  Hypothyroidism    .  History of cardiac arrhythmia      cardiologist- Traci Turner   .  Esophageal stricture    .  Thyroid nodule    .  Colon polyp    .  Dysrhythmia      IRREG   .  GERD (gastroesophageal reflux disease)    .  Sleep apnea      10-15 YRS AGO DOES NOT USE [facility]    Past Surgical History   Procedure  Laterality  Date   .  Other surgical history       4 Lumbar surgeries   .  Lumbar fusion       and rods   .  Cesarean section     .  Ankle surgery       left ankle 1995   .  Infusion pump implantation       history of implantablet morphine pump   .  Partial hysterectomy     .  Knee surgery   1991   .  Elbow surgery   1990's   .  Thyroidectomy       2012   .  Cervical spine surgery       2012   .  Foot surgery       RT FOOT SPUR REMOVED 2012   .  Shoulder surgery       09/2011 LEFT   .  Abdominal hysterectomy     .  Back surgery       BACK X 4   .  Lumbar laminectomy/decompression microdiscectomy  N/A  11/28/2012     Procedure:  DECOMPRESSIVE LUMBAR LAMINECTOMY LEVEL 1; Surgeon: Mariam Dollar, MD; Location: [facility]; Service: Neurosurgery; Laterality: N/A; DECOMPRESSIVE LUMBAR LAMINECTOMY LEVEL 1    Family History   Problem  Relation  Age of Onset   .  Pancreatic cancer  Father      deceased age 36   .  Cancer  Father    .  Diabetes type II  Mother      deceased age 101   .  Hypertension  Mother    .  Coronary artery disease  Mother    .  Diabetes  Mother    .  Cancer  Mother  Thyroid and Skin   .  Diabetes  Sister    .  Hypertension  Sister    .  Cancer  Sister      Breast   .  Other  Neg Hx      No family history of colon cancer   .  Hypertension  Brother    Social History  History   Substance Use Topics   .  Smoking status:  Current Every Day Smoker -- 1.00 packs/day for 20 years     Types:  Cigarettes   .  Smokeless tobacco:  Not on file      Comment: 1 ppd - 20 years   .  Alcohol Use:  No    Allergies   Allergen  Reactions   .  Amitriptyline  Other (See Comments)     Leg swelling   .  Gabapentin      REACTION: Itching  Ask patient to clarify "Neurogan" on history form dated 09/25/10.   Marland Kitchen  Hydrochlorothiazide W-Triamterene      REACTION: rash and itching    Current Outpatient Prescriptions   Medication  Sig  Dispense  Refill   .  bisacodyl (DULCOLAX) 5 MG EC tablet  Take 2 tablets (10 mg total) by mouth at bedtime.  30 tablet    .  carisoprodol (SOMA) 350 MG tablet  Take 350 mg by mouth 3 (three) times daily as needed for muscle spasms. For muscle spasms     .  Cholecalciferol (HM VITAMIN D3) 2000 UNITS CAPS  Take 1 capsule by mouth daily.     .  clonazePAM (KLONOPIN) 1 MG tablet  TAKE ONE TABLET BY MOUTH AT BEDTIME AS NEEDED  30 tablet  3   .  Cyanocobalamin (VITAMIN B-12) 5000 MCG SUBL  Place 1 tablet under the tongue daily.     .  Diclofenac Sodium (PENNSAID) 1.5 % SOLN  Place 1 each onto the skin See admin instructions. Patient puts a drop on fingertips and rubs into the skin.      Marland Kitchen  estradiol (ESTRACE) 0.5 MG tablet  Take 1 tablet by mouth daily.     .  furosemide (LASIX) 20 MG tablet  Take 1 tablet (20 mg total) by mouth daily as needed.  60 tablet  3   .  hydrochlorothiazide (HYDRODIURIL) 25 MG tablet  Take 1 tablet (25 mg total) by mouth daily.  90 tablet  1   .  HYDROcodone-acetaminophen (NORCO) 10-325 MG per tablet  Take 1 tablet by mouth every 4 (four) hours.     Marland Kitchen  levothyroxine (SYNTHROID, LEVOTHROID) 88 MCG tablet  Take 1 tablet (88 mcg total) by mouth daily.  14 tablet  0   .  Melatonin 5 MG CAPS  Take 5 mg by mouth daily.     .  Omega-3 Fatty Acids (FISH OIL) 1200 MG CAPS  Take 2,400 mg by mouth daily.     Marland Kitchen  omeprazole (PRILOSEC) 20 MG capsule  Take 1 capsule (20 mg total) by mouth 2 (two) times daily.  180 capsule  3   .  potassium chloride SA (KLOR-CON M20) 20 MEQ tablet  Take 1 tablet (20 mEq total) by mouth 2 (two) times daily.  180 tablet  3   .  simvastatin (ZOCOR) 20 MG tablet  Take 1 tablet (20 mg total) by mouth at bedtime.  90 tablet  1    No current facility-administered medications for this visit.   Review  of Systems  Review of Systems  Constitutional: Negative for fever, chills and unexpected weight change.  HENT: Negative for hearing loss, congestion, sore throat, trouble swallowing and voice change.  Eyes: Negative for visual disturbance.  Respiratory: Negative for cough and wheezing.  Cardiovascular: Negative for chest pain, palpitations and leg swelling.  Gastrointestinal: Positive for nausea, abdominal pain, diarrhea and abdominal distention. Negative for vomiting, constipation, blood in stool and anal bleeding.  Genitourinary: Negative for hematuria, vaginal bleeding and difficulty urinating.  Musculoskeletal: Negative for arthralgias.  Skin: Negative for rash and wound.  Neurological: Negative for seizures, syncope and headaches.  Hematological: Negative for adenopathy. Does not bruise/bleed easily.  Psychiatric/Behavioral: Negative  for confusion.  Blood pressure 110/62, pulse 68, resp. rate 14, height 5\' 2"  (1.575 m), weight 135 lb (61.236 kg).  Physical Exam  Physical Exam  WDWN in NAD  HEENT: EOMI, sclera anicteric  Neck: No masses, no thyromegaly  Lungs: CTA bilaterally; normal respiratory effort  CV: Regular rate and rhythm; no murmurs  Abd: +bowel sounds, soft, minimal RUQ tenderness; no palpable masses  Ext: Well-perfused; no edema  Skin: Warm, dry; no sign of jaundice  Data Reviewed  Lab Results   Component  Value  Date    WBC  5.0  11/21/2012    HGB  12.9  11/21/2012    HCT  37.8  11/21/2012    MCV  87.5  11/21/2012    PLT  214  11/21/2012    Lab Results   Component  Value  Date    ALT  9  01/15/2013    AST  15  01/15/2013    ALKPHOS  72  01/15/2013    BILITOT  0.4  01/15/2013   RADIOLOGY REPORT*  Clinical Data: Abdominal bloating and discomfort, smoking history  COMPLETE ABDOMINAL ULTRASOUND  Comparison: None.  Findings:  Gallbladder: The gallbladder is visualized and there is a large  gallstone of 3.4 cm in diameter with acoustical shadowing. An  additional small echogenic focus does not appear to move and may  represent a polyp of 7 mm. In addition there is comet-tail  artifact consistent with adenomyomatosis of the gallbladder. There  is no pain over the gallbladder with compression.  Common bile duct: The common bile duct is normal measuring 6.3 mm  in diameter.  Liver: The liver has a normal echogenic pattern. No focal  abnormality is seen.  IVC: Appears normal.  Pancreas: The pancreas is largely obscured by bowel gas.  Spleen: The spleen is normal measuring 5.8 cm sagittally.  Right Kidney: No hydronephrosis is seen. The right kidney  measures 10.8 cm sagittally.  Left Kidney: No hydronephrosis is noted. The left kidney measures  11.1 cm.  Abdominal aorta: The abdominal aorta is normal in caliber.  IMPRESSION:  1. Single large gallstone of 3.4 cm in diameter. No pain over the   gallbladder with compression.  2. Changes of adenomyomatosis of the gallbladder.  3. Probable 7 mm gallbladder polyp.  4. The pancreas is obscured by bowel gas.  Original Report Authenticated By: Dwyane Dee, M.D.  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  The surgical procedure has been discussed with the patient. Potential risks, benefits, alternative treatments, and expected outcomes have been explained. All of the patient's questions at this time have been answered. The likelihood of reaching the patient's treatment goal is good. The patient understand the proposed surgical procedure and wishes to proceed.  Wilmon Arms. Corliss Skains, MD, [facility] Surgery  General/ Trauma Surgery  04/16/2013 7:13 AM

## 2013-04-16 NOTE — Anesthesia Postprocedure Evaluation (Signed)
Anesthesia Post Note  Patient: Joan [name]  Procedure(s) Performed: Procedure(s) (LRB): LAPAROSCOPIC CHOLECYSTECTOMY  (N/A) LAPAROSCOPIC LYSIS OF ADHESIONS (N/A)  Anesthesia type: General  Patient location: PACU  Post pain: Pain level controlled  Post assessment: Post-op Vital signs reviewed  Last Vitals: BP 132/67  Pulse 65  Temp(Src) 36.4 C (Oral)  Resp 12  SpO2 98%  Post vital signs: Reviewed  Level of consciousness: sedated  Complications: No apparent anesthesia complications

## 2013-04-17 ENCOUNTER — Encounter (HOSPITAL_COMMUNITY): Payer: Self-pay | Admitting: Surgery

## 2013-04-17 NOTE — Progress Notes (Signed)
C/O lip being pinched when "tube in throat". Rt lower lip slightly swollen with skin crack on inside lip. Patient denied need for anesthesia to come see it.

## 2013-04-29 ENCOUNTER — Encounter (INDEPENDENT_AMBULATORY_CARE_PROVIDER_SITE_OTHER): Payer: Self-pay | Admitting: Surgery

## 2013-04-29 ENCOUNTER — Ambulatory Visit (INDEPENDENT_AMBULATORY_CARE_PROVIDER_SITE_OTHER): Payer: Medicare Other | Admitting: Surgery

## 2013-04-29 VITALS — BP 108/64 | HR 70 | Temp 98.1°F | Resp 14 | Ht 62.0 in | Wt 136.6 lb

## 2013-04-29 DIAGNOSIS — K8010 Calculus of gallbladder with chronic cholecystitis without obstruction: Secondary | ICD-10-CM

## 2013-04-29 DIAGNOSIS — Z9889 Other specified postprocedural states: Secondary | ICD-10-CM | POA: Insufficient documentation

## 2013-04-29 DIAGNOSIS — R112 Nausea with vomiting, unspecified: Secondary | ICD-10-CM

## 2013-04-29 LAB — CBC
HCT: 39.8 % (ref 36.0–46.0)
Hemoglobin: 13.5 g/dL (ref 12.0–15.0)
MCH: 30.5 pg (ref 26.0–34.0)
MCHC: 33.9 g/dL (ref 30.0–36.0)
MCV: 89.8 fL (ref 78.0–100.0)
Platelets: 182 10*3/uL (ref 150–400)
RBC: 4.43 MIL/uL (ref 3.87–5.11)
RDW: 14.3 % (ref 11.5–15.5)
WBC: 3.8 10*3/uL — ABNORMAL LOW (ref 4.0–10.5)

## 2013-04-29 MED ORDER — ONDANSETRON 4 MG PO TBDP: 4.0000 mg | ORAL_TABLET | Freq: Three times a day (TID) | ORAL | Status: DC | PRN

## 2013-04-29 NOTE — Progress Notes (Signed)
Status post operative cholecystectomy on 04/16/13. The patient's abdominal pain has improved but she continues to have significant nausea. She did have some nausea before surgery but this seems to be worse postop. The Phenergan does not seem to be completely relieving her symptoms. She denies any significant abdominal pain. Her bowel movements are unremarkable with no significant diarrhea. The patient has a limited appetite due to her nausea. She denies any fever.  Filed Vitals:   04/29/13 1327  BP: 108/64  Pulse: 70  Temp: 98.1 F (36.7 C)  Resp: 14   Lungs clear auscultation bilaterally CV regular rate and rhythm with no murmurs Abdomen soft, nondistended, well-healed incisions. No significant abdominal pain. No palpable masses. Skin warm and dry with no sign of jaundice  Impression: Postoperative nausea after laparoscopic cholecystectomy for chronic cholecystitis.  Plan: We will check a CBC and CMET today. We will change her medication to Zofran to see if this helps with her nausea. We will hold off any imaging studies until we have seen her blood work.  Follow-up 2 weeks or sooner if needed.  Wilmon Arms. Corliss Skains, MD, [facility] Surgery  General/ Trauma Surgery  04/29/2013 5:06 PM

## 2013-04-30 ENCOUNTER — Telehealth (INDEPENDENT_AMBULATORY_CARE_PROVIDER_SITE_OTHER): Payer: Self-pay | Admitting: General Surgery

## 2013-04-30 LAB — COMPREHENSIVE METABOLIC PANEL
ALT: 12 U/L (ref 0–35)
AST: 15 U/L (ref 0–37)
Albumin: 4.3 g/dL (ref 3.5–5.2)
Alkaline Phosphatase: 83 U/L (ref 39–117)
BUN: 16 mg/dL (ref 6–23)
CO2: 31 mEq/L (ref 19–32)
Calcium: 8.5 mg/dL (ref 8.4–10.5)
Chloride: 98 mEq/L (ref 96–112)
Creat: 0.8 mg/dL (ref 0.50–1.10)
Glucose, Bld: 92 mg/dL (ref 70–99)
Potassium: 3.4 mEq/L — ABNORMAL LOW (ref 3.5–5.3)
Sodium: 137 mEq/L (ref 135–145)
Total Bilirubin: 0.5 mg/dL (ref 0.3–1.2)
Total Protein: 6.6 g/dL (ref 6.0–8.3)

## 2013-04-30 NOTE — Progress Notes (Signed)
Quick Note:  Please call the patient and let them know that their labs are normal. There is no sign of any infection or problems with the surgery. ______

## 2013-04-30 NOTE — Telephone Encounter (Signed)
Called patient and let her know that her lab work was normal , and she went to get the Rx for nausea yesterday and it was $ 90 dollars and she asked if she could take what she had every 4 hours and I told her that she could and I told her to she need to not to drive when taking this med. I told her to do liquids and we will see her at her next apt. And if she needs to call me she can if she needs anything

## 2013-05-01 ENCOUNTER — Other Ambulatory Visit: Payer: Self-pay | Admitting: *Deleted

## 2013-05-01 ENCOUNTER — Other Ambulatory Visit: Payer: Self-pay

## 2013-05-01 ENCOUNTER — Telehealth: Payer: Self-pay | Admitting: Internal Medicine

## 2013-05-01 MED ORDER — HYDROCHLOROTHIAZIDE 25 MG PO TABS: 25.0000 mg | ORAL_TABLET | Freq: Every morning | ORAL | Status: DC

## 2013-05-01 NOTE — Telephone Encounter (Signed)
Pt needs hctz 25 mg #10 call into [facility] in [place] [phone]. Pt also needs #90 with refills [facility]

## 2013-05-01 NOTE — Telephone Encounter (Signed)
done

## 2013-05-01 NOTE — Telephone Encounter (Signed)
Rx request for hydrochlorothiazide.  Rx last filled 10/16/12.  Rx sent to pharmacy.

## 2013-05-06 ENCOUNTER — Ambulatory Visit (INDEPENDENT_AMBULATORY_CARE_PROVIDER_SITE_OTHER): Payer: Medicare Other | Admitting: Internal Medicine

## 2013-05-06 ENCOUNTER — Encounter: Payer: Self-pay | Admitting: Internal Medicine

## 2013-05-06 VITALS — BP 122/78 | HR 72 | Temp 98.1°F | Resp 16 | Ht 62.0 in | Wt 137.0 lb

## 2013-05-06 DIAGNOSIS — E039 Hypothyroidism, unspecified: Secondary | ICD-10-CM

## 2013-05-06 DIAGNOSIS — I700 Atherosclerosis of aorta: Secondary | ICD-10-CM

## 2013-05-06 DIAGNOSIS — Z9889 Other specified postprocedural states: Secondary | ICD-10-CM

## 2013-05-06 DIAGNOSIS — I10 Essential (primary) hypertension: Secondary | ICD-10-CM

## 2013-05-06 DIAGNOSIS — R112 Nausea with vomiting, unspecified: Secondary | ICD-10-CM

## 2013-05-06 MED ORDER — ONDANSETRON HCL 4 MG PO TABS: 4.0000 mg | ORAL_TABLET | Freq: Three times a day (TID) | ORAL | Status: DC | PRN

## 2013-05-06 MED ORDER — POTASSIUM CHLORIDE CRYS ER 20 MEQ PO TBCR: 20.0000 meq | EXTENDED_RELEASE_TABLET | Freq: Two times a day (BID) | ORAL | Status: DC

## 2013-05-06 NOTE — Assessment & Plan Note (Signed)
Continue simvastatin 20 mg once daily. 

## 2013-05-06 NOTE — Assessment & Plan Note (Signed)
Patient having issues with post cholecystectomy nausea. She was prescribed Zofran ODT it is cost prohibitive. Switch to generic Zofran 4 mg every 8 hours as needed. Patient understands that nausea and loose stools after cholecystectomy should be self-limiting.

## 2013-05-06 NOTE — Assessment & Plan Note (Signed)
Monitor thyroid function tests before next office visit. Lab Results  Component Value Date   TSH 0.099* 03/06/2013

## 2013-05-06 NOTE — Patient Instructions (Signed)
Please complete the following lab tests before your next follow up appointment: BMET - 401.9 TSH, Free T4 - 244.9

## 2013-05-06 NOTE — Assessment & Plan Note (Signed)
Blood pressure stable. She has mild hypokalemia. Patient advised to increase her potassium intake when she uses her furosemide. BP: 122/78 mmHg  Lab Results  Component Value Date   NA 137 04/29/2013   K 3.4* 04/29/2013   CL 98 04/29/2013   CO2 31 04/29/2013   Lab Results  Component Value Date   CREATININE 0.80 04/29/2013

## 2013-05-06 NOTE — Progress Notes (Signed)
Subjective:    Patient ID: Joan [name], female    DOB: [birth year], 67 y.o.   MRN: [id number]  HPI  67 year old white female with history of tobacco use, hypertension and gallstones for followup. Since previous visit patient underwent elective laparoscopic cholecystectomy area patient did well postop. She is having issues with nausea and diarrhea since her gallbladder surgery. She has been using Phenergan every 8 hours as needed.  Lower extremity edema-she restarted Lasix after her surgery. Her potassium is slightly low.  Hypothyroidism-patient reports gaining weight since taking lower dose of levothyroxine.  Review of Systems Loose stools, nausea but no vomiting  Past Medical History  Diagnosis Date  . Chronic low back pain     followed by Dr Vear Clock pain mgt  . Hyperlipemia   . Osteoarthritis of left knee     advanced  . Tobacco abuse   . Hypothyroidism   . History of cardiac arrhythmia     cardiologist- Traci Turner  . Esophageal stricture   . Thyroid nodule   . Colon polyp   . GERD (gastroesophageal reflux disease)   . Dysrhythmia 2008    IRREG     . Sleep apnea     10-15 YRS AGO DOES NOT USE [facility]    History   Social History  . Marital Status: Divorced    Spouse Name: N/A    Number of Children: N/A  . Years of Education: N/A   Occupational History  . Not on file.   Social History Main Topics  . Smoking status: Current Every Day Smoker -- 1.00 packs/day for 20 years    Types: Cigarettes  . Smokeless tobacco: Never Used     Comment: 1 ppd - 20 years  . Alcohol Use: No  . Drug Use: No  . Sexual Activity: Not on file   Other Topics Concern  . Not on file   Social History Narrative   Divorced   Current Smoker  1 ppd -  20 yrs      Alcohol use-no       [employer] - laid off     Past Surgical History  Procedure Laterality Date  . Other surgical history      4 Lumbar surgeries  . Lumbar fusion      and rods  . Cesarean section     . Ankle surgery      left ankle 1995  . Infusion pump implantation  yrs ago    history of implantablet morphine pump, pump then removed  . Partial hysterectomy    . Knee surgery  1991  . Elbow surgery  1990's  . Thyroidectomy      2012  . Cervical spine surgery      2012  . Foot surgery      RT FOOT SPUR REMOVED 2012   . Shoulder surgery  arthroscopy    09/2011  LEFT  . Lumbar laminectomy/decompression microdiscectomy N/A 11/28/2012    Procedure: DECOMPRESSIVE LUMBAR LAMINECTOMY LEVEL 1;  Surgeon: Mariam Dollar, MD;  Location: [facility];  Service: Neurosurgery;  Laterality: N/A;  DECOMPRESSIVE LUMBAR LAMINECTOMY LEVEL 1  . Abdominal hysterectomy    . Back surgery      BACK X 8   . Cholecystectomy N/A 04/16/2013    Procedure: LAPAROSCOPIC CHOLECYSTECTOMY ;  Surgeon: Wilmon Arms. Corliss Skains, MD;  Location: [facility];  Service: General;  Laterality: N/A;  . Laparoscopic lysis of adhesions N/A 04/16/2013    Procedure: LAPAROSCOPIC LYSIS  OF ADHESIONS;  Surgeon: Wilmon Arms. Corliss Skains, MD;  Location: [facility];  Service: General;  Laterality: N/A;    Family History  Problem Relation Age of Onset  . Pancreatic cancer Father     deceased age 49  . Cancer Father   . Diabetes type II Mother     deceased age 68  . Hypertension Mother   . Coronary artery disease Mother   . Diabetes Mother   . Cancer Mother     Thyroid and Skin  . Diabetes Sister   . Hypertension Sister   . Cancer Sister     Breast  . Other Neg Hx     No family history of  colon cancer  . Hypertension Brother     Allergies  Allergen Reactions  . Amitriptyline Other (See Comments)    Leg swelling  . Gabapentin     REACTION: Itching Ask patient to clarify "Neurogan" on history form dated 09/25/10.  Marland Kitchen Hydrochlorothiazide W-Triamterene     REACTION: rash and itching, can take hctz    Current Outpatient Prescriptions on File Prior to Visit  Medication Sig Dispense Refill  . bisacodyl (DULCOLAX) 5 MG EC tablet Take 5-15 mg by  mouth daily as needed for constipation.   30 tablet    . carisoprodol (SOMA) 350 MG tablet Take 350 mg by mouth 3 (three) times daily as needed for muscle spasms. For muscle spasms      . Cholecalciferol (VITAMIN D3) 5000 UNITS TABS Take 1 tablet by mouth daily.      . clonazePAM (KLONOPIN) 1 MG tablet Take 1 mg by mouth at bedtime.      . Cyanocobalamin (VITAMIN B-12) 2500 MCG SUBL Place 2 tablets under the tongue daily.      Marland Kitchen estradiol (ESTRACE) 0.5 MG tablet Take 1 tablet by mouth daily.      . furosemide (LASIX) 20 MG tablet Take 20 mg by mouth every morning.      Marland Kitchen glucosamine-chondroitin 500-400 MG tablet Take 2 tablets by mouth daily.      . hydrochlorothiazide (HYDRODIURIL) 25 MG tablet Take 1 tablet (25 mg total) by mouth every morning.  90 tablet  3  . HYDROcodone-acetaminophen (NORCO) 10-325 MG per tablet       . levothyroxine (SYNTHROID, LEVOTHROID) 88 MCG tablet Take 88 mcg by mouth daily before breakfast.      . Melatonin 3 MG TABS Take 3 mg by mouth at bedtime.      . Omega-3 Fatty Acids (FISH OIL) 1200 MG CAPS Take 2,400 mg by mouth daily.      Marland Kitchen omeprazole (PRILOSEC) 20 MG capsule Take 20 mg by mouth 2 (two) times daily.      . promethazine (PHENERGAN) 12.5 MG tablet Take 1 tablet (12.5 mg total) by mouth every 6 (six) hours as needed for nausea.  30 tablet  0  . simvastatin (ZOCOR) 20 MG tablet Take 20 mg by mouth at bedtime.       No current facility-administered medications on file prior to visit.    BP 122/78  Pulse 72  Temp(Src) 98.1 F (36.7 C)  Resp 16  Ht 5\' 2"  (1.575 m)  Wt 137 lb (62.143 kg)  BMI 25.05 kg/m2       Objective:   Physical Exam  Constitutional: She is oriented to person, place, and time. She appears well-developed and well-nourished. No distress.  HENT:  Head: Normocephalic and atraumatic.  Neck: Neck supple.  Cardiovascular: Normal rate, regular  rhythm and normal heart sounds.   Pulmonary/Chest: Effort normal and breath sounds normal.  She has no wheezes.  Abdominal: Soft. Bowel sounds are normal. There is no tenderness.  Abdominal incisions well-healed  Musculoskeletal: She exhibits edema.  Neurological: She is alert and oriented to person, place, and time. No cranial nerve deficit.  Psychiatric: She has a normal mood and affect. Her behavior is normal.          Assessment & Plan:

## 2013-05-07 ENCOUNTER — Ambulatory Visit: Payer: Medicare Other | Admitting: Internal Medicine

## 2013-05-08 ENCOUNTER — Telehealth: Payer: Self-pay | Admitting: Internal Medicine

## 2013-05-08 MED ORDER — HYDROCHLOROTHIAZIDE 25 MG PO TABS: 25.0000 mg | ORAL_TABLET | Freq: Every morning | ORAL | Status: DC

## 2013-05-08 NOTE — Telephone Encounter (Signed)
rx sent in electronically 

## 2013-05-08 NOTE — Telephone Encounter (Signed)
Pt is going out of town in the am and has not received her hydrochlorothiazide (HYDRODIURIL) 25 MG tablet from [employer]. Pt would like a 15 to 30 day supply sent to local pharm today please. [facility]. [address] in [place]

## 2013-05-12 ENCOUNTER — Encounter (INDEPENDENT_AMBULATORY_CARE_PROVIDER_SITE_OTHER): Payer: Medicare Other | Admitting: Surgery

## 2013-05-18 ENCOUNTER — Encounter (INDEPENDENT_AMBULATORY_CARE_PROVIDER_SITE_OTHER): Payer: Self-pay | Admitting: Surgery

## 2013-05-18 ENCOUNTER — Ambulatory Visit (INDEPENDENT_AMBULATORY_CARE_PROVIDER_SITE_OTHER): Payer: Medicare Other | Admitting: Surgery

## 2013-05-18 VITALS — BP 126/72 | HR 68 | Temp 98.0°F | Resp 14 | Ht 62.0 in | Wt 136.8 lb

## 2013-05-18 DIAGNOSIS — Z9889 Other specified postprocedural states: Secondary | ICD-10-CM

## 2013-05-18 DIAGNOSIS — R112 Nausea with vomiting, unspecified: Secondary | ICD-10-CM

## 2013-05-18 DIAGNOSIS — K8010 Calculus of gallbladder with chronic cholecystitis without obstruction: Secondary | ICD-10-CM

## 2013-05-18 MED ORDER — PROMETHAZINE HCL 12.5 MG PO TABS: 12.5000 mg | ORAL_TABLET | Freq: Four times a day (QID) | ORAL | Status: DC | PRN

## 2013-05-18 NOTE — Progress Notes (Signed)
Follow up of her postoperative nausea and diarrhea after her cholecystectomy on 04/16/13. She is feeling much better. She has not had any significant nausea in the last week. Her bowel movements have returned to normal. She actually has occasional constipation. Her incisions are all well-healed with no sign of infection. No abdominal tenderness. She may followup with [place] as needed.  Wilmon Arms. Corliss Skains, MD, [facility] Surgery  General/ Trauma Surgery  05/18/2013 3:50 PM

## 2013-05-28 ENCOUNTER — Other Ambulatory Visit: Payer: Self-pay | Admitting: Neurosurgery

## 2013-05-28 DIAGNOSIS — M545 Low back pain: Secondary | ICD-10-CM

## 2013-06-07 ENCOUNTER — Ambulatory Visit
Admission: RE | Admit: 2013-06-07 | Discharge: 2013-06-07 | Disposition: A | Discharge status: Home / Self Care | Payer: Medicare Other | Source: Ambulatory Visit | Attending: Neurosurgery | Admitting: Neurosurgery

## 2013-06-07 DIAGNOSIS — M545 Low back pain: Secondary | ICD-10-CM

## 2013-06-07 MED ORDER — GADOBENATE DIMEGLUMINE 529 MG/ML IV SOLN
12.0000 mL | Freq: Once | INTRAVENOUS | Status: AC | PRN
  Administered 2013-06-07: 12 mL via INTRAVENOUS

## 2013-06-11 ENCOUNTER — Other Ambulatory Visit: Payer: Self-pay | Admitting: Neurosurgery

## 2013-06-12 ENCOUNTER — Encounter (HOSPITAL_COMMUNITY): Payer: Self-pay | Admitting: Pharmacy Technician

## 2013-06-15 ENCOUNTER — Other Ambulatory Visit: Payer: Self-pay | Admitting: Neurosurgery

## 2013-06-15 DIAGNOSIS — M545 Low back pain: Secondary | ICD-10-CM

## 2013-06-15 DIAGNOSIS — M5430 Sciatica, unspecified side: Secondary | ICD-10-CM

## 2013-06-16 ENCOUNTER — Telehealth: Payer: Self-pay | Admitting: Internal Medicine

## 2013-06-16 NOTE — Telephone Encounter (Signed)
Pt requesting refill of clonazePAM (KLONOPIN) 1 MG tablet sent to [facility] in [place].

## 2013-06-17 ENCOUNTER — Ambulatory Visit
Admission: RE | Admit: 2013-06-17 | Discharge: 2013-06-17 | Disposition: A | Discharge status: Home / Self Care | Payer: Medicare Other | Source: Ambulatory Visit | Attending: Neurosurgery | Admitting: Neurosurgery

## 2013-06-17 DIAGNOSIS — M5430 Sciatica, unspecified side: Secondary | ICD-10-CM

## 2013-06-17 DIAGNOSIS — M545 Low back pain: Secondary | ICD-10-CM

## 2013-06-17 NOTE — Telephone Encounter (Signed)
Ok to refill x 3 

## 2013-06-18 ENCOUNTER — Other Ambulatory Visit: Payer: Self-pay | Admitting: Internal Medicine

## 2013-06-18 NOTE — Progress Notes (Signed)
Office called, spoke with [place] regarding orders not being signed. Pt has PAT tomorrow. Erie Noe replied that she would "let him know".

## 2013-06-18 NOTE — Telephone Encounter (Signed)
rx called in

## 2013-06-18 NOTE — Pre-Procedure Instructions (Signed)
Joan [name]  06/18/2013   Your procedure is scheduled on:  Wednesday, June 24, 2013  Report to [facility] Entrance "A" [address] at 11:00 AM.  Call this number if you have problems the morning of surgery: [phone]   Remember:   Do not eat food or drink liquids after midnight.   Take these medicines the morning of surgery with A SIP OF WATER: Hydrocodone-acetaminophen (Norco) if needed, levothyroxine (Synthroid), omeprazole (Prilosec), carisoprodol (SOMA) if needed and promethazine (Phenergan) if needed   STOP taking Aspirin, Goody's, BC's, Aleve (Naproxen), Ibuprofen (Advil or Motrin), Fish Oil, Vitamins (D3 & B-12), Glucosamine-chondroitin, Herbal Supplements or any substance that could thin your blood starting today 06/19/13.   Do not wear jewelry, make-up or nail polish.  Do not wear lotions, powders, or perfumes. You may wear deodorant.  Do not shave 48 hours prior to surgery.  Do not bring valuables to the hospital.  [facility] is not responsible for any belongings or valuables.               Contacts, dentures or bridgework may not be worn into surgery.  Leave suitcase in the car. After surgery it may be brought to your room.  For patients admitted to the hospital, discharge time is determined by your treatment team.                 Special Instructions: Shower using CHG 2 nights before surgery and the night before surgery.  If you shower the day of surgery use CHG.  Use special wash - you have one bottle of CHG for all showers.  You should use approximately 1/3 of the bottle for each shower.   Please read over the following fact sheets that you were given: Pain Booklet, Coughing and Deep Breathing, Blood Transfusion Information, MRSA Information and Surgical Site Infection Prevention

## 2013-06-19 ENCOUNTER — Encounter (HOSPITAL_COMMUNITY): Payer: Self-pay

## 2013-06-19 ENCOUNTER — Encounter (HOSPITAL_COMMUNITY)
Admission: RE | Admit: 2013-06-19 | Discharge: 2013-06-19 | Disposition: A | Discharge status: Home / Self Care | Payer: Medicare Other | Source: Ambulatory Visit | Attending: Neurosurgery | Admitting: Neurosurgery

## 2013-06-19 DIAGNOSIS — Z01812 Encounter for preprocedural laboratory examination: Secondary | ICD-10-CM | POA: Insufficient documentation

## 2013-06-19 DIAGNOSIS — Z01818 Encounter for other preprocedural examination: Secondary | ICD-10-CM | POA: Insufficient documentation

## 2013-06-19 DIAGNOSIS — Z01810 Encounter for preprocedural cardiovascular examination: Secondary | ICD-10-CM | POA: Insufficient documentation

## 2013-06-19 HISTORY — DX: Nausea with vomiting, unspecified: R11.2

## 2013-06-19 HISTORY — DX: Other specified postprocedural states: Z98.890

## 2013-06-19 LAB — TYPE AND SCREEN
ABO/RH(D): A POS
Antibody Screen: NEGATIVE

## 2013-06-19 LAB — CBC
HCT: 42.3 % (ref 36.0–46.0)
Hemoglobin: 14.5 g/dL (ref 12.0–15.0)
MCH: 31.3 pg (ref 26.0–34.0)
MCHC: 34.3 g/dL (ref 30.0–36.0)
MCV: 91.2 fL (ref 78.0–100.0)
Platelets: 183 10*3/uL (ref 150–400)
RBC: 4.64 MIL/uL (ref 3.87–5.11)
RDW: 13.8 % (ref 11.5–15.5)
WBC: 5.2 10*3/uL (ref 4.0–10.5)

## 2013-06-19 LAB — BASIC METABOLIC PANEL
BUN: 25 mg/dL — ABNORMAL HIGH (ref 6–23)
CO2: 28 mEq/L (ref 19–32)
Calcium: 8.8 mg/dL (ref 8.4–10.5)
Chloride: 101 mEq/L (ref 96–112)
Creatinine, Ser: 0.83 mg/dL (ref 0.50–1.10)
GFR calc Af Amer: 82 mL/min — ABNORMAL LOW (ref >90)
GFR calc non Af Amer: 71 mL/min — ABNORMAL LOW (ref >90)
Glucose, Bld: 95 mg/dL (ref 70–99)
Potassium: 4 mEq/L (ref 3.5–5.1)
Sodium: 140 mEq/L (ref 135–145)

## 2013-06-19 LAB — SURGICAL PCR SCREEN
MRSA, PCR: NEGATIVE
Staphylococcus aureus: NEGATIVE

## 2013-06-19 NOTE — Progress Notes (Signed)
  Pt reports seeing a Cardiologist (Dr. Armanda Magic) about 10-15 years ago for cardiac arthymia and wore a Holter monitor & was placed on meds for BP which caused BP to "bottom out" so was taken off meds.  Pt reports last EKG was 04/2012, stress test over 10 years ago with Dr. Mayford Knife. Pt denies ECHO, heart cath, chest pain or shortness of breath.  PCP is Dr. Artist Pais.

## 2013-06-19 NOTE — Progress Notes (Signed)
Attempted to call Erie Noe, Dr. Lonie Peak scheduler, again today for release of orders but was informed by office operator that staff had already left for the day. Consent will have to be signed on DOS as PAT is 1400 today.

## 2013-06-23 MED ORDER — LACTATED RINGERS IV SOLN
INTRAVENOUS | Status: DC
  Administered 2013-06-24: 11:00:00 via INTRAVENOUS

## 2013-06-24 ENCOUNTER — Encounter (HOSPITAL_COMMUNITY)
Admission: RE | Disposition: A | Discharge status: Home / Self Care | Payer: Medicare Other | Source: Ambulatory Visit | Attending: Neurosurgery

## 2013-06-24 ENCOUNTER — Encounter (HOSPITAL_COMMUNITY): Payer: Self-pay | Admitting: *Deleted

## 2013-06-24 ENCOUNTER — Inpatient Hospital Stay (HOSPITAL_COMMUNITY): Payer: Medicare Other | Admitting: Anesthesiology

## 2013-06-24 ENCOUNTER — Inpatient Hospital Stay (HOSPITAL_COMMUNITY): Payer: Medicare Other

## 2013-06-24 ENCOUNTER — Inpatient Hospital Stay (HOSPITAL_COMMUNITY)
Admission: RE | Admit: 2013-06-24 | Discharge: 2013-07-01 | DRG: 460 | Disposition: A | Discharge status: Home / Self Care | Payer: Medicare Other | Source: Ambulatory Visit | Attending: Neurosurgery | Admitting: Neurosurgery

## 2013-06-24 ENCOUNTER — Encounter (HOSPITAL_COMMUNITY): Payer: Medicare Other | Admitting: Anesthesiology

## 2013-06-24 DIAGNOSIS — M48061 Spinal stenosis, lumbar region without neurogenic claudication: Secondary | ICD-10-CM | POA: Diagnosis present

## 2013-06-24 DIAGNOSIS — M479 Spondylosis, unspecified: Secondary | ICD-10-CM | POA: Diagnosis present

## 2013-06-24 DIAGNOSIS — M1710 Unilateral primary osteoarthritis, unspecified knee: Secondary | ICD-10-CM | POA: Diagnosis present

## 2013-06-24 DIAGNOSIS — M5137 Other intervertebral disc degeneration, lumbosacral region: Principal | ICD-10-CM | POA: Diagnosis present

## 2013-06-24 DIAGNOSIS — F17200 Nicotine dependence, unspecified, uncomplicated: Secondary | ICD-10-CM | POA: Diagnosis present

## 2013-06-24 DIAGNOSIS — M51379 Other intervertebral disc degeneration, lumbosacral region without mention of lumbar back pain or lower extremity pain: Principal | ICD-10-CM | POA: Diagnosis present

## 2013-06-24 DIAGNOSIS — K219 Gastro-esophageal reflux disease without esophagitis: Secondary | ICD-10-CM | POA: Diagnosis present

## 2013-06-24 DIAGNOSIS — E039 Hypothyroidism, unspecified: Secondary | ICD-10-CM | POA: Diagnosis present

## 2013-06-24 DIAGNOSIS — Z8249 Family history of ischemic heart disease and other diseases of the circulatory system: Secondary | ICD-10-CM

## 2013-06-24 DIAGNOSIS — Z833 Family history of diabetes mellitus: Secondary | ICD-10-CM

## 2013-06-24 DIAGNOSIS — E785 Hyperlipidemia, unspecified: Secondary | ICD-10-CM | POA: Diagnosis present

## 2013-06-24 DIAGNOSIS — Z981 Arthrodesis status: Secondary | ICD-10-CM

## 2013-06-24 HISTORY — PX: POSTERIOR FUSION LUMBAR SPINE: SUR632

## 2013-06-24 HISTORY — DX: Cardiac murmur, unspecified: R01.1

## 2013-06-24 HISTORY — DX: Unspecified osteoarthritis, unspecified site: M19.90

## 2013-06-24 SURGERY — POSTERIOR LUMBAR FUSION 1 LEVEL
Anesthesia: General | Site: Back

## 2013-06-24 MED ORDER — GLYCOPYRROLATE 0.2 MG/ML IJ SOLN
INTRAMUSCULAR | Status: DC | PRN
  Administered 2013-06-24: 0.4 mg via INTRAVENOUS

## 2013-06-24 MED ORDER — HYDROMORPHONE HCL PF 1 MG/ML IJ SOLN
INTRAMUSCULAR | Status: AC
  Filled 2013-06-24: qty 1

## 2013-06-24 MED ORDER — BUPIVACAINE HCL (PF) 0.25 % IJ SOLN
INTRAMUSCULAR | Status: DC | PRN
  Administered 2013-06-24: 20 mL

## 2013-06-24 MED ORDER — FENTANYL CITRATE 0.05 MG/ML IJ SOLN
INTRAMUSCULAR | Status: DC | PRN
  Administered 2013-06-24 (×6): 50 ug via INTRAVENOUS
  Administered 2013-06-24: 100 ug via INTRAVENOUS
  Administered 2013-06-24 (×3): 50 ug via INTRAVENOUS
  Administered 2013-06-24: 100 ug via INTRAVENOUS
  Administered 2013-06-24 (×2): 50 ug via INTRAVENOUS

## 2013-06-24 MED ORDER — FUROSEMIDE 20 MG PO TABS
20.0000 mg | ORAL_TABLET | Freq: Every morning | ORAL | Status: DC
  Administered 2013-06-25 – 2013-07-01 (×7): 20 mg via ORAL
  Filled 2013-06-24 (×7): qty 1

## 2013-06-24 MED ORDER — PROMETHAZINE HCL 25 MG PO TABS
12.5000 mg | ORAL_TABLET | Freq: Four times a day (QID) | ORAL | Status: DC | PRN
  Filled 2013-06-24: qty 1

## 2013-06-24 MED ORDER — CEFAZOLIN SODIUM 1-5 GM-% IV SOLN
1.0000 g | Freq: Three times a day (TID) | INTRAVENOUS | Status: AC
  Administered 2013-06-24 – 2013-06-25 (×2): 1 g via INTRAVENOUS
  Filled 2013-06-24 (×3): qty 50

## 2013-06-24 MED ORDER — ONDANSETRON HCL 4 MG/2ML IJ SOLN: 4.0000 mg | INTRAMUSCULAR | Status: DC | PRN

## 2013-06-24 MED ORDER — 0.9 % SODIUM CHLORIDE (POUR BTL) OPTIME
TOPICAL | Status: DC | PRN
  Administered 2013-06-24: 1000 mL

## 2013-06-24 MED ORDER — DOCUSATE SODIUM 100 MG PO CAPS
100.0000 mg | ORAL_CAPSULE | Freq: Two times a day (BID) | ORAL | Status: DC
  Administered 2013-06-24 – 2013-06-30 (×12): 100 mg via ORAL
  Filled 2013-06-24 (×12): qty 1

## 2013-06-24 MED ORDER — CLONAZEPAM 0.5 MG PO TABS
0.5000 mg | ORAL_TABLET | Freq: Every day | ORAL | Status: DC
  Administered 2013-06-24 – 2013-06-26 (×2): 0.5 mg via ORAL
  Filled 2013-06-24 (×3): qty 1

## 2013-06-24 MED ORDER — BISACODYL 5 MG PO TBEC
10.0000 mg | DELAYED_RELEASE_TABLET | Freq: Every day | ORAL | Status: DC
  Administered 2013-06-24 – 2013-06-28 (×4): 10 mg via ORAL
  Filled 2013-06-24 (×6): qty 2

## 2013-06-24 MED ORDER — CEFAZOLIN SODIUM-DEXTROSE 2-3 GM-% IV SOLR
INTRAVENOUS | Status: AC
  Administered 2013-06-24: 2 g via INTRAVENOUS
  Filled 2013-06-24: qty 50

## 2013-06-24 MED ORDER — ACETAMINOPHEN 650 MG RE SUPP: 650.0000 mg | RECTAL | Status: DC | PRN

## 2013-06-24 MED ORDER — CARISOPRODOL 350 MG PO TABS
350.0000 mg | ORAL_TABLET | Freq: Three times a day (TID) | ORAL | Status: DC | PRN
  Administered 2013-06-24 – 2013-07-01 (×9): 350 mg via ORAL
  Filled 2013-06-24 (×10): qty 1

## 2013-06-24 MED ORDER — POTASSIUM CHLORIDE CRYS ER 20 MEQ PO TBCR
60.0000 meq | EXTENDED_RELEASE_TABLET | Freq: Every day | ORAL | Status: DC
  Administered 2013-06-24 – 2013-07-01 (×8): 60 meq via ORAL
  Filled 2013-06-24 (×8): qty 3

## 2013-06-24 MED ORDER — LIDOCAINE-EPINEPHRINE 1 %-1:100000 IJ SOLN
INTRAMUSCULAR | Status: DC | PRN
  Administered 2013-06-24: 10 mL

## 2013-06-24 MED ORDER — ONDANSETRON HCL 4 MG/2ML IJ SOLN
INTRAMUSCULAR | Status: DC | PRN
  Administered 2013-06-24: 4 mg via INTRAVENOUS

## 2013-06-24 MED ORDER — PANTOPRAZOLE SODIUM 40 MG PO TBEC
40.0000 mg | DELAYED_RELEASE_TABLET | Freq: Every day | ORAL | Status: DC
  Administered 2013-06-24 – 2013-07-01 (×8): 40 mg via ORAL
  Filled 2013-06-24 (×8): qty 1

## 2013-06-24 MED ORDER — ROCURONIUM BROMIDE 100 MG/10ML IV SOLN
INTRAVENOUS | Status: DC | PRN
  Administered 2013-06-24: 50 mg via INTRAVENOUS
  Administered 2013-06-24: 10 mg via INTRAVENOUS

## 2013-06-24 MED ORDER — KCL IN DEXTROSE-NACL 20-5-0.45 MEQ/L-%-% IV SOLN
INTRAVENOUS | Status: DC
  Administered 2013-06-24 – 2013-06-29 (×8): via INTRAVENOUS
  Filled 2013-06-24 (×13): qty 1000

## 2013-06-24 MED ORDER — LEVOTHYROXINE SODIUM 88 MCG PO TABS
88.0000 ug | ORAL_TABLET | Freq: Every day | ORAL | Status: DC
  Administered 2013-06-25 – 2013-07-01 (×7): 88 ug via ORAL
  Filled 2013-06-24 (×8): qty 1

## 2013-06-24 MED ORDER — HYDROCHLOROTHIAZIDE 25 MG PO TABS
25.0000 mg | ORAL_TABLET | Freq: Every day | ORAL | Status: DC
  Administered 2013-06-24 – 2013-07-01 (×7): 25 mg via ORAL
  Filled 2013-06-24 (×8): qty 1

## 2013-06-24 MED ORDER — HYDROCODONE-ACETAMINOPHEN 10-325 MG PO TABS
1.0000 | ORAL_TABLET | ORAL | Status: DC | PRN
  Administered 2013-06-29 – 2013-07-01 (×11): 1 via ORAL
  Filled 2013-06-24 (×11): qty 1

## 2013-06-24 MED ORDER — THROMBIN 20000 UNITS EX SOLR
CUTANEOUS | Status: DC | PRN
  Administered 2013-06-24: 14:00:00 via TOPICAL

## 2013-06-24 MED ORDER — OXYCODONE-ACETAMINOPHEN 5-325 MG PO TABS
1.0000 | ORAL_TABLET | ORAL | Status: DC | PRN
  Administered 2013-06-24 – 2013-06-25 (×2): 2 via ORAL
  Filled 2013-06-24: qty 2

## 2013-06-24 MED ORDER — EPHEDRINE SULFATE 50 MG/ML IJ SOLN
INTRAMUSCULAR | Status: DC | PRN
  Administered 2013-06-24 (×3): 5 mg via INTRAVENOUS

## 2013-06-24 MED ORDER — HYDROMORPHONE HCL PF 1 MG/ML IJ SOLN
0.5000 mg | INTRAMUSCULAR | Status: DC | PRN
  Administered 2013-06-24 – 2013-06-25 (×5): 1 mg via INTRAVENOUS
  Administered 2013-06-25: 0.5 mg via INTRAVENOUS
  Administered 2013-06-25 – 2013-06-27 (×9): 1 mg via INTRAVENOUS
  Filled 2013-06-24 (×16): qty 1

## 2013-06-24 MED ORDER — SODIUM CHLORIDE 0.9 % IV SOLN: 250.0000 mL | INTRAVENOUS | Status: DC

## 2013-06-24 MED ORDER — NEOSTIGMINE METHYLSULFATE 1 MG/ML IJ SOLN
INTRAMUSCULAR | Status: DC | PRN
  Administered 2013-06-24: 3 mg via INTRAVENOUS

## 2013-06-24 MED ORDER — MIDAZOLAM HCL 5 MG/5ML IJ SOLN
INTRAMUSCULAR | Status: DC | PRN
  Administered 2013-06-24 (×2): 1 mg via INTRAVENOUS

## 2013-06-24 MED ORDER — SODIUM CHLORIDE 0.9 % IJ SOLN: 3.0000 mL | INTRAMUSCULAR | Status: DC | PRN

## 2013-06-24 MED ORDER — SODIUM CHLORIDE 0.9 % IJ SOLN
3.0000 mL | Freq: Two times a day (BID) | INTRAMUSCULAR | Status: DC
  Administered 2013-06-25 – 2013-06-26 (×3): 3 mL via INTRAVENOUS

## 2013-06-24 MED ORDER — ALUM & MAG HYDROXIDE-SIMETH 200-200-20 MG/5ML PO SUSP: 30.0000 mL | Freq: Four times a day (QID) | ORAL | Status: DC | PRN

## 2013-06-24 MED ORDER — SIMVASTATIN 20 MG PO TABS
20.0000 mg | ORAL_TABLET | Freq: Every day | ORAL | Status: DC
  Administered 2013-06-24 – 2013-06-30 (×7): 20 mg via ORAL
  Filled 2013-06-24 (×8): qty 1

## 2013-06-24 MED ORDER — LACTATED RINGERS IV SOLN
INTRAVENOUS | Status: DC | PRN
  Administered 2013-06-24 (×2): via INTRAVENOUS

## 2013-06-24 MED ORDER — MENTHOL 3 MG MT LOZG: 1.0000 | LOZENGE | OROMUCOSAL | Status: DC | PRN

## 2013-06-24 MED ORDER — OXYCODONE-ACETAMINOPHEN 5-325 MG PO TABS
ORAL_TABLET | ORAL | Status: AC
  Filled 2013-06-24: qty 2

## 2013-06-24 MED ORDER — PHENYLEPHRINE HCL 10 MG/ML IJ SOLN
INTRAMUSCULAR | Status: DC | PRN
  Administered 2013-06-24 (×3): 80 ug via INTRAVENOUS

## 2013-06-24 MED ORDER — ACETAMINOPHEN 325 MG PO TABS
650.0000 mg | ORAL_TABLET | ORAL | Status: DC | PRN
  Administered 2013-06-25: 650 mg via ORAL
  Filled 2013-06-24: qty 2

## 2013-06-24 MED ORDER — SUCCINYLCHOLINE CHLORIDE 20 MG/ML IJ SOLN
INTRAMUSCULAR | Status: DC | PRN
  Administered 2013-06-24: 100 mg via INTRAVENOUS

## 2013-06-24 MED ORDER — HYDROMORPHONE HCL PF 1 MG/ML IJ SOLN
0.2500 mg | INTRAMUSCULAR | Status: DC | PRN
  Administered 2013-06-24 (×4): 0.5 mg via INTRAVENOUS

## 2013-06-24 MED ORDER — PROPOFOL 10 MG/ML IV BOLUS
INTRAVENOUS | Status: DC | PRN
  Administered 2013-06-24: 50 mg via INTRAVENOUS
  Administered 2013-06-24: 150 mg via INTRAVENOUS

## 2013-06-24 MED ORDER — LIDOCAINE HCL (CARDIAC) 20 MG/ML IV SOLN
INTRAVENOUS | Status: DC | PRN
  Administered 2013-06-24: 60 mg via INTRAVENOUS
  Administered 2013-06-24: 10 mg via INTRAVENOUS

## 2013-06-24 MED ORDER — SODIUM CHLORIDE 0.9 % IR SOLN
Status: DC | PRN
  Administered 2013-06-24: 14:00:00

## 2013-06-24 MED ORDER — PHENOL 1.4 % MT LIQD: 1.0000 | OROMUCOSAL | Status: DC | PRN

## 2013-06-24 MED ORDER — ALBUMIN HUMAN 5 % IV SOLN
INTRAVENOUS | Status: DC | PRN
  Administered 2013-06-24: 15:00:00 via INTRAVENOUS

## 2013-06-24 SURGICAL SUPPLY — 74 items
BAG DECANTER FOR FLEXI CONT (MISCELLANEOUS) ×2 IMPLANT
BENZOIN TINCTURE PRP APPL 2/3 (GAUZE/BANDAGES/DRESSINGS) ×2 IMPLANT
BLADE SURG 11 STRL SS (BLADE) ×2 IMPLANT
BLADE SURG ROTATE 9660 (MISCELLANEOUS) IMPLANT
BRUSH SCRUB EZ PLAIN DRY (MISCELLANEOUS) ×2 IMPLANT
BUR MATCHSTICK NEURO 3.0 LAGG (BURR) ×2 IMPLANT
BUR PRECISION FLUTE 6.0 (BURR) ×2 IMPLANT
CANISTER SUCT 3000ML (MISCELLANEOUS) ×2 IMPLANT
CAP LOCKING THREADED (Cap) ×8 IMPLANT
CONT SPEC 4OZ CLIKSEAL STRL BL (MISCELLANEOUS) ×4 IMPLANT
COVER BACK TABLE 24X17X13 BIG (DRAPES) IMPLANT
COVER TABLE BACK 60X90 (DRAPES) ×2 IMPLANT
DECANTER SPIKE VIAL GLASS SM (MISCELLANEOUS) ×2 IMPLANT
DERMABOND ADHESIVE PROPEN (GAUZE/BANDAGES/DRESSINGS) ×1
DERMABOND ADVANCED (GAUZE/BANDAGES/DRESSINGS)
DERMABOND ADVANCED .7 DNX12 (GAUZE/BANDAGES/DRESSINGS) IMPLANT
DERMABOND ADVANCED .7 DNX6 (GAUZE/BANDAGES/DRESSINGS) ×1 IMPLANT
DRAPE C-ARM 42X72 X-RAY (DRAPES) ×4 IMPLANT
DRAPE LAPAROTOMY 100X72X124 (DRAPES) ×2 IMPLANT
DRAPE POUCH INSTRU U-SHP 10X18 (DRAPES) ×2 IMPLANT
DRAPE PROXIMA HALF (DRAPES) IMPLANT
DRAPE SURG 17X23 STRL (DRAPES) ×2 IMPLANT
DRSG OPSITE 4X5.5 SM (GAUZE/BANDAGES/DRESSINGS) IMPLANT
DRSG OPSITE POSTOP 4X6 (GAUZE/BANDAGES/DRESSINGS) ×2 IMPLANT
DURAPREP 26ML APPLICATOR (WOUND CARE) ×2 IMPLANT
ELECT CAUTERY BLADE 6.4 (BLADE) ×2 IMPLANT
ELECT REM PT RETURN 9FT ADLT (ELECTROSURGICAL) ×2
ELECTRODE REM PT RTRN 9FT ADLT (ELECTROSURGICAL) ×1 IMPLANT
EVACUATOR 3/16  PVC DRAIN (DRAIN) ×1
EVACUATOR 3/16 PVC DRAIN (DRAIN) ×1 IMPLANT
GAUZE SPONGE 4X4 16PLY XRAY LF (GAUZE/BANDAGES/DRESSINGS) IMPLANT
GLOVE BIO SURGEON STRL SZ8 (GLOVE) ×4 IMPLANT
GLOVE BIOGEL PI IND STRL 7.0 (GLOVE) ×2 IMPLANT
GLOVE BIOGEL PI INDICATOR 7.0 (GLOVE) ×2
GLOVE ECLIPSE 6.5 STRL STRAW (GLOVE) ×2 IMPLANT
GLOVE ECLIPSE 7.5 STRL STRAW (GLOVE) IMPLANT
GLOVE EXAM NITRILE LRG STRL (GLOVE) IMPLANT
GLOVE EXAM NITRILE MD LF STRL (GLOVE) IMPLANT
GLOVE EXAM NITRILE XL STR (GLOVE) IMPLANT
GLOVE EXAM NITRILE XS STR PU (GLOVE) IMPLANT
GLOVE INDICATOR 8.5 STRL (GLOVE) ×4 IMPLANT
GLOVE SURG SS PI 7.0 STRL IVOR (GLOVE) ×6 IMPLANT
GOWN BRE IMP SLV AUR LG STRL (GOWN DISPOSABLE) ×2 IMPLANT
GOWN BRE IMP SLV AUR XL STRL (GOWN DISPOSABLE) ×6 IMPLANT
GOWN STRL REIN 2XL LVL4 (GOWN DISPOSABLE) IMPLANT
KIT BASIN OR (CUSTOM PROCEDURE TRAY) ×2 IMPLANT
KIT INFUSE XX SMALL 0.7CC (Orthopedic Implant) ×2 IMPLANT
KIT ROOM TURNOVER OR (KITS) ×2 IMPLANT
MILL MEDIUM DISP (BLADE) ×2 IMPLANT
NEEDLE HYPO 25X1 1.5 SAFETY (NEEDLE) ×2 IMPLANT
NS IRRIG 1000ML POUR BTL (IV SOLUTION) ×2 IMPLANT
PACK LAMINECTOMY NEURO (CUSTOM PROCEDURE TRAY) ×2 IMPLANT
PAD ARMBOARD 7.5X6 YLW CONV (MISCELLANEOUS) ×8 IMPLANT
PATTIES SURGICAL 1X1 (DISPOSABLE) ×2 IMPLANT
PUTTY BONE DBX 5CC MIX (Putty) ×2 IMPLANT
RASP 3.0MM (RASP) ×2 IMPLANT
ROD 40MM SPINAL (Rod) ×4 IMPLANT
SCREW CREO 6.5X35MM (Screw) ×4 IMPLANT
SCREW CREO 6.5X40 (Screw) ×4 IMPLANT
SCREW PA THRD CREO TULIP 5.5X4 (Head) ×8 IMPLANT
SPACER CALIBER 10X22 9-13MM-12 (Spacer) ×4 IMPLANT
SPONGE GAUZE 4X4 12PLY (GAUZE/BANDAGES/DRESSINGS) ×2 IMPLANT
SPONGE LAP 4X18 X RAY DECT (DISPOSABLE) IMPLANT
SPONGE SURGIFOAM ABS GEL 100 (HEMOSTASIS) ×2 IMPLANT
STRIP CLOSURE SKIN 1/2X4 (GAUZE/BANDAGES/DRESSINGS) ×2 IMPLANT
SUT VIC AB 0 CT1 18XCR BRD8 (SUTURE) ×2 IMPLANT
SUT VIC AB 0 CT1 8-18 (SUTURE) ×2
SUT VIC AB 2-0 CT1 18 (SUTURE) ×2 IMPLANT
SUT VICRYL 4-0 PS2 18IN ABS (SUTURE) ×2 IMPLANT
SYR 20ML ECCENTRIC (SYRINGE) ×2 IMPLANT
TOWEL OR 17X24 6PK STRL BLUE (TOWEL DISPOSABLE) ×2 IMPLANT
TOWEL OR 17X26 10 PK STRL BLUE (TOWEL DISPOSABLE) ×2 IMPLANT
TRAY FOLEY CATH 14FRSI W/METER (CATHETERS) ×2 IMPLANT
WATER STERILE IRR 1000ML POUR (IV SOLUTION) ×2 IMPLANT

## 2013-06-24 NOTE — Transfer of Care (Signed)
Immediate Anesthesia Transfer of Care Note  Patient: Joan [name]  Procedure(s) Performed: Procedure(s) with comments: Posterior Lumbar Interbody Fusion Lumbar five to Sacral one with screws cages and rods  (N/A) - Posterior Lumbar Interbody Fusion Lumbar five to Sacral one with screws cages and rods   Patient Location: PACU  Anesthesia Type:General  Level of Consciousness: awake, alert , oriented, patient cooperative and responds to stimulation  Airway & Oxygen Therapy: Patient Spontanous Breathing and Patient connected to nasal cannula oxygen  Post-op Assessment: Report given to PACU RN, Post -op Vital signs reviewed and stable and Patient moving all extremities X 4  Post vital signs: Reviewed and stable  Complications: No apparent anesthesia complications

## 2013-06-24 NOTE — Anesthesia Postprocedure Evaluation (Signed)
  Anesthesia Post-op Note  Patient: Joan [name]  Procedure(s) Performed: Procedure(s) with comments: Posterior Lumbar Interbody Fusion Lumbar five to Sacral one with screws cages and rods  (N/A) - Posterior Lumbar Interbody Fusion Lumbar five to Sacral one with screws cages and rods   Patient Location: PACU  Anesthesia Type:General  Level of Consciousness: awake  Airway and Oxygen Therapy: Patient Spontanous Breathing  Post-op Pain: mild  Post-op Assessment: Post-op Vital signs reviewed  Post-op Vital Signs: Reviewed  Complications: No apparent anesthesia complications

## 2013-06-24 NOTE — Op Note (Signed)
Preoperative diagnosis: Recurrent ruptured disc L5-S1 degenerative disc disease L5-S1 lumbar spinal stenosis L5-S1 and a right-sided L5 and S1 radiculopathies  Postoperative diagnosis: Same  Procedure: #1 redo decompressive lumbar laminectomy L5-S1 and excess and requiring more work than a standard interbody fusion with complete medial facetectomies radical foraminotomies of the L5 and S1 nerve roots bilaterally  #2 posterior lumbar interbody fusion L5-S1 using globus caliber expandable peek cages packed with locally harvested autograft mixed with DBX  #3 pedicle screw fixation L5-S1 using the globus Creo modular pedicle screw system with 6 5 x 40 pedicle screws at L5 and 6 5 x 35 S1  #4 posterior lateral arthrodesis L5-S1 using locally harvested autograft mixed with DBX and BMP  Surgeon: Jillyn Hidden Zuma Hust  Assistant: Coletta Memos  Anesthesia: Gen.  EBL: Minimal  History of present illness: Patient is a pleasant 67 year old female whose had long-standing and lumbar spine surgery and undergone 3 previous lumbar fusions down L5.several months ago developed a disc herniation at L5-S1 underwent laminectomy and discectomy and initially did very well however last weeks and months of progressive worsening back pain and right leg pain imaging revealed recurrent disc herniation progressive deterioration facet arthropathy and degenerative disc at L5-S1 below her lumbar fusion due to her progression of clinical syndrome failed conservative treatment imaging findings I recommended a reexploration and redo dissected discectomy and decompression and posterior lumbar in by fusion L5-S1 she was solid fusion above this so I was not he did not removing the hardware at her last the level of screws was L4.  Operative procedure: Patient brought into the or was induced under general anesthesia and positioned prone the Wilson frame her back was prepped and draped in routine sterile fashion. Spinous process of L5 the lamina  of L5 and S1 were exposed and the hardware was immediately visualized at L4 facet complexes L5 was then identified and scar tissues dissected off of the right side the laminotomy defect and then the spinous process at L5 was removed laminectomy was begun on the left side complete medial facetectomy was performed and radical foraminotomies of the L5 and S1 nerve roots on the leftidentified epidural veins are coagulated then working from the left side to the right work into the scar tissue and extensive and dense mask R. tissue was teased off of the dura and removed in piecemeal fashion the facet complex was removed there is marked facet arthropathy causing severe stenosis of both the distal traversing or exiting L5 root and the traversing S1 root the disc spaces inspected there was a recurrent disc this was removed and then after the decompression been achieved this taken the interbody work the spaces cleanout bilaterally sequential distraction was carried out a 10 distractor and the patient right side and is felt to be appropriate sizing for the implant so I used 9 expandable cages after packing them with locally harvested our graft and DBX and after adequate preparation of the endplates and surgery well his cages and in turn to 3 and half turns to approximately 11 mm tall in the left side removed the distractor on the right fluoroscopy was used the step along the way the spaces and cleanout in the right endplates were prepared local are graft mixed DBX was packed centrally and laterally another cage was inserted the distal bone graft was packed laterally to both cages. The contralateral cage was expanded to a similar fashion after all interbody work been done the L5-S1 foramina were widely patent. These then packed away  attention to to placement using a high-speed drill and fluoroscopy pilot holes were drilled at L5 pedicle was cannulated probed tapped probed again and 6 5 x 4 40 screws inserted L5 6 5 x 35 S1 then  the was copiously irrigated meticulous in space was maintained aggressive decortication was care MTPs or lateral gutters a mixture of an extra small BMP kit as well as local are graft mixed DBX then inserted bilaterally and then the caps were then reassembled to the pedicle screws the 2 heads then the rods were selected and tightened down and torqued down. At the end of case and the construct was solid although foramina were patent Gelfoam was overlaid top of the dura the muscle fascia proximal with interrupted Vicryl skin was) 4 subcuticular benzo and Steri-Strips were applied patient recovered in stable condition. In any case on it counts sponge counts were correct.

## 2013-06-24 NOTE — Anesthesia Preprocedure Evaluation (Addendum)
Anesthesia Evaluation  Patient identified by MRN, date of birth, ID band Patient awake    Reviewed: Allergy & Precautions, H&P , NPO status , Patient's Chart, lab work & pertinent test results  Airway Mallampati: III TM Distance: >3 FB Neck ROM: Limited    Dental  (+) Teeth Intact and Dental Advisory Given   Pulmonary sleep apnea , Current Smoker,  breath sounds clear to auscultation  + decreased breath sounds      Cardiovascular hypertension, + Peripheral Vascular Disease + dysrhythmias Rhythm:Regular     Neuro/Psych negative neurological ROS     GI/Hepatic Neg liver ROS, GERD-  Medicated and Controlled,  Endo/Other  Hypothyroidism   Renal/GU negative Renal ROS     Musculoskeletal   Abdominal Normal abdominal exam  (+)   Peds  Hematology   Anesthesia Other Findings   Reproductive/Obstetrics                         Anesthesia Physical Anesthesia Plan  ASA: III  Anesthesia Plan: General   Post-op Pain Management:    Induction: Intravenous  Airway Management Planned: Oral ETT  Additional Equipment:   Intra-op Plan:   Post-operative Plan: Extubation in OR  Informed Consent: I have reviewed the patients History and Physical, chart, labs and discussed the procedure including the risks, benefits and alternatives for the proposed anesthesia with the patient or authorized representative who has indicated his/her understanding and acceptance.   Dental advisory given  Plan Discussed with: Surgeon, CRNA and Anesthesiologist  Anesthesia Plan Comments:        Anesthesia Quick Evaluation

## 2013-06-24 NOTE — H&P (Signed)
Joan [name] is an 67 y.o. female.   Chief Complaint: Back and right leg pain HPI: Patient is very pleasant 67 year old female undergone previous lumbar fusion thoracic down L5 and it then presented with an acute right S1 radiculopathy underwent laminotomy and discectomy for this and did very well for about 3 or 4 months since her experiencing progressively worsening back and right leg pain rating down L5-S1 distribution workup revealed progressive collapse and stenosis at L5 and S1 nerve roots at the L5-S1 disc space and due to her progression of clinical syndrome failed conservative treatment and imaging findings have recommended interbody fusion L5-S1 help decompress the foramina both L5 and S1 extensor the risks and benefits of the operation with her as well as perioperative course and expectations of outcome and alternatives to surgery and she understood and agreed to proceed forward.  Past Medical History  Diagnosis Date  . Chronic low back pain     followed by Dr Vear Clock pain mgt  . Hyperlipemia   . Osteoarthritis of left knee     advanced  . Tobacco abuse   . Hypothyroidism   . History of cardiac arrhythmia     cardiologist- Traci Turner  . Esophageal stricture   . Thyroid nodule   . Colon polyp   . GERD (gastroesophageal reflux disease)   . Dysrhythmia 2008    IRREG     . Sleep apnea     10-15 YRS AGO DOES NOT USE MACH  . PONV (postoperative nausea and vomiting)     Pt reports symptoms are the result of gallbladder and cholecystectomy, not anesthesia    Past Surgical History  Procedure Laterality Date  . Other surgical history      4 Lumbar surgeries  . Lumbar fusion      and rods  . Cesarean section    . Ankle surgery      left ankle 1995  . Infusion pump implantation  yrs ago    history of implantablet morphine pump, pump then removed  . Partial hysterectomy    . Knee surgery  1991  . Elbow surgery  1990's  . Thyroidectomy      2012  . Cervical spine  surgery      2012  . Foot surgery      RT FOOT SPUR REMOVED 2012   . Shoulder surgery  arthroscopy    09/2011  LEFT  . Lumbar laminectomy/decompression microdiscectomy N/A 11/28/2012    Procedure: DECOMPRESSIVE LUMBAR LAMINECTOMY LEVEL 1;  Surgeon: Mariam Dollar, MD;  Location: [facility];  Service: Neurosurgery;  Laterality: N/A;  DECOMPRESSIVE LUMBAR LAMINECTOMY LEVEL 1  . Abdominal hysterectomy    . Back surgery      BACK X 8   . Cholecystectomy N/A 04/16/2013    Procedure: LAPAROSCOPIC CHOLECYSTECTOMY ;  Surgeon: Wilmon Arms. Corliss Skains, MD;  Location: [facility];  Service: General;  Laterality: N/A;  . Laparoscopic lysis of adhesions N/A 04/16/2013    Procedure: LAPAROSCOPIC LYSIS OF ADHESIONS;  Surgeon: Wilmon Arms. Corliss Skains, MD;  Location: [facility];  Service: General;  Laterality: N/A;    Family History  Problem Relation Age of Onset  . Pancreatic cancer Father     deceased age 32  . Cancer Father   . Diabetes type II Mother     deceased age 70  . Hypertension Mother   . Coronary artery disease Mother   . Diabetes Mother   . Cancer Mother     Thyroid and  Skin  . Diabetes Sister   . Hypertension Sister   . Cancer Sister     Breast  . Other Neg Hx     No family history of  colon cancer  . Hypertension Brother    Social History:  reports that she has been smoking Cigarettes.  She has a 20 pack-year smoking history. She has never used smokeless tobacco. She reports that she does not drink alcohol or use illicit drugs.  Allergies:  Allergies  Allergen Reactions  . Amitriptyline Other (See Comments)    Leg swelling  . Gabapentin     REACTION: Itching Ask patient to clarify "Neurogan" on history form dated 09/25/10.  . Triamterene Itching and Rash    Medications Prior to Admission  Medication Sig Dispense Refill  . bisacodyl (DULCOLAX) 5 MG EC tablet Take 10 mg by mouth at bedtime.   30 tablet    . carisoprodol (SOMA) 350 MG tablet Take 350 mg by mouth 3 (three) times daily as needed  for muscle spasms. For muscle spasms      . Cholecalciferol (VITAMIN D3) 5000 UNITS TABS Take 5,000 Units by mouth daily.       . clonazePAM (KLONOPIN) 1 MG tablet TAKE ONE TABLET BY MOUTH ONCE DAILY AT BEDTIME AS NEEDED  30 tablet  3  . Cyanocobalamin (VITAMIN B-12) 2500 MCG SUBL Place 5,000 mcg under the tongue daily.       Marland Kitchen estradiol (ESTRACE) 0.5 MG tablet Take 0.5 mg by mouth at bedtime.       . furosemide (LASIX) 20 MG tablet Take 20 mg by mouth every morning.      Marland Kitchen glucosamine-chondroitin 500-400 MG tablet Take 2 tablets by mouth daily.      . hydrochlorothiazide (HYDRODIURIL) 25 MG tablet Take 25 mg by mouth daily.      Marland Kitchen HYDROcodone-acetaminophen (NORCO) 10-325 MG per tablet Take 1 tablet by mouth every 4 (four) hours as needed for moderate pain.       Marland Kitchen levothyroxine (SYNTHROID, LEVOTHROID) 88 MCG tablet Take 88 mcg by mouth daily before breakfast.      . Melatonin 3 MG TABS Take 3 mg by mouth at bedtime.      . Omega-3 Fatty Acids (FISH OIL) 1200 MG CAPS Take 2,400 mg by mouth daily.      Marland Kitchen omeprazole (PRILOSEC) 20 MG capsule Take 40 mg by mouth 2 (two) times daily as needed (for indigestion).       . potassium chloride SA (K-DUR,KLOR-CON) 20 MEQ tablet Take 60 mEq by mouth daily.      . promethazine (PHENERGAN) 12.5 MG tablet Take 12.5 mg by mouth every 6 (six) hours as needed for nausea or vomiting.      . simvastatin (ZOCOR) 20 MG tablet Take 20 mg by mouth at bedtime.        No results found for this or any previous visit (from the past 48 hour(s)). No results found.  Review of Systems  Constitutional: Negative.   Eyes: Negative.   Respiratory: Negative.   Cardiovascular: Negative.   Gastrointestinal: Negative.   Genitourinary: Negative.   Musculoskeletal: Positive for myalgias and neck pain.  Skin: Negative.   Neurological: Positive for tingling and sensory change.  Endo/Heme/Allergies: Negative.   Psychiatric/Behavioral: Negative.     Blood pressure 121/59, pulse  64, temperature 98.5 F (36.9 C), temperature source Oral, resp. rate 20, SpO2 100.00%. Physical Exam  Constitutional: She is oriented to person, place, and time. She appears  well-developed.  HENT:  Head: Normocephalic.  Eyes: Pupils are equal, round, and reactive to light.  Neck: Normal range of motion.  Respiratory: Effort normal and breath sounds normal.  GI: Soft. Bowel sounds are normal.  Musculoskeletal: Normal range of motion.  Neurological: She is alert and oriented to person, place, and time. She has normal strength. GCS eye subscore is 4. GCS verbal subscore is 5. GCS motor subscore is 6.  Reflex Scores:      Patellar reflexes are 0 on the right side and 0 on the left side.      Achilles reflexes are 0 on the right side and 0 on the left side. Strength is 5 out of 5 in her iliopsoas, quads, and she's, gastrocs, and tibialis, and EHL.  Skin: Skin is warm and dry.     Assessment/Plan 67 year old and presents for an L5-S1 posterior lumbar interbody fusion  Phillip Maffei P 06/24/2013, 1:20 PM

## 2013-06-24 NOTE — Preoperative (Signed)
Beta Blockers   Reason not to administer Beta Blockers:Not Applicable 

## 2013-06-25 MED ORDER — HYDROMORPHONE HCL 2 MG PO TABS
4.0000 mg | ORAL_TABLET | ORAL | Status: DC | PRN
  Administered 2013-06-25 – 2013-06-26 (×3): 4 mg via ORAL
  Filled 2013-06-25 (×3): qty 2

## 2013-06-25 MED ORDER — NICOTINE 21 MG/24HR TD PT24
21.0000 mg | MEDICATED_PATCH | Freq: Every day | TRANSDERMAL | Status: DC
  Administered 2013-06-25 – 2013-06-30 (×5): 21 mg via TRANSDERMAL
  Filled 2013-06-25 (×8): qty 1

## 2013-06-25 MED ORDER — DEXAMETHASONE SODIUM PHOSPHATE 10 MG/ML IJ SOLN
10.0000 mg | Freq: Four times a day (QID) | INTRAMUSCULAR | Status: AC
  Administered 2013-06-25 – 2013-06-26 (×4): 10 mg via INTRAVENOUS
  Filled 2013-06-25 (×9): qty 1

## 2013-06-25 MED ORDER — METHOCARBAMOL 100 MG/ML IJ SOLN
1000.0000 mg | Freq: Four times a day (QID) | INTRAVENOUS | Status: DC | PRN
  Administered 2013-06-25 – 2013-06-28 (×8): 1000 mg via INTRAVENOUS
  Filled 2013-06-25 (×10): qty 10

## 2013-06-25 MED ORDER — OXYCODONE HCL 5 MG PO TABS
15.0000 mg | ORAL_TABLET | ORAL | Status: DC | PRN
  Administered 2013-06-25 (×2): 15 mg via ORAL
  Filled 2013-06-25 (×2): qty 3

## 2013-06-25 MED ORDER — PROCHLORPERAZINE EDISYLATE 5 MG/ML IJ SOLN
10.0000 mg | Freq: Four times a day (QID) | INTRAMUSCULAR | Status: DC | PRN
  Administered 2013-06-25 – 2013-06-28 (×9): 10 mg via INTRAVENOUS
  Filled 2013-06-25 (×10): qty 2

## 2013-06-25 MED ORDER — ESTRADIOL 1 MG PO TABS
0.5000 mg | ORAL_TABLET | Freq: Every day | ORAL | Status: DC
  Administered 2013-06-25 – 2013-06-29 (×5): 0.5 mg via ORAL
  Administered 2013-06-30: 22:00:00 via ORAL
  Filled 2013-06-25 (×8): qty 0.5

## 2013-06-25 MED ORDER — NICOTINE 21 MG/24HR TD PT24: 21.0000 mg | MEDICATED_PATCH | Freq: Every day | TRANSDERMAL | Status: DC

## 2013-06-25 MED FILL — Sodium Chloride IV Soln 0.9%: INTRAVENOUS | Qty: 1000 | Status: AC

## 2013-06-25 MED FILL — Heparin Sodium (Porcine) Inj 1000 Unit/ML: INTRAMUSCULAR | Qty: 30 | Status: AC

## 2013-06-25 NOTE — Progress Notes (Signed)
PT Cancellation Note  Patient Details Name: Joan [name] MRN: [id number] DOB: [birth year]   Cancelled Treatment:    Reason Eval/Treat Not Completed: Patient not medically ready; per md keep her flat bedrest today mobilization tomorrow    Fabio Asa 06/25/2013, 12:15 PM

## 2013-06-25 NOTE — Progress Notes (Signed)
Subjective: Patient reports She's having a lot of pain this morning she had a headache as well as a lot of pain that's encompassing her entire lower back she feels it is going to her legs denies it seems different in her preoperative pain in her preoperative radicular symptoms seem to be resolved she denies any new numbness or tingling  Objective: Vital signs in last 24 hours: Temp:  [97 F (36.1 C)-99.4 F (37.4 C)] 99.4 F (37.4 C) (12/18 0607) Pulse Rate:  [50-92] 92 (12/18 0607) Resp:  [9-20] 18 (12/18 0607) BP: (85-125)/(40-62) 113/55 mmHg (12/18 0607) SpO2:  [96 %-100 %] 96 % (12/18 0607)  Intake/Output from previous day: 12/17 0701 - 12/18 0700 In: 2050 [I.V.:1800; IV Piggyback:250] Out: 350 [Urine:150; Blood:200] Intake/Output this shift:    Awake alert very uncomfortable secondary to pain lower Cordelia Pen strength seems to be 5 out of 5 wound is flat clean and dry  Lab Results: No results found for this basename: WBC, HGB, HCT, PLT,  in the last 72 hours BMET No results found for this basename: NA, K, CL, CO2, GLUCOSE, BUN, CREATININE, CALCIUM,  in the last 72 hours  Studies/Results: Dg Lumbar Spine 2-3 Views  06/24/2013   CLINICAL DATA:  L5-S1 PLIF.  EXAM: LUMBAR SPINE - 2-3 VIEW; DG C-ARM 1-60 MIN  COMPARISON:  CT 06/17/2013.  FINDINGS: Spot frontal and lateral radiographs of the lower lumbar spine demonstrate preexisting hardware extending inferiorly to L4. Bilateral pedicle screws have been placed at L5 and S1. There are 2 interbody spacers at L5-S1 which appear well positioned. The interconnecting rods have not yet been placed.  IMPRESSION: Intraoperative views during L5-S1 PLIF. No demonstrated complication.   Electronically Signed   By: Roxy Horseman M.D.   On: 06/24/2013 17:30   Dg C-arm 1-60 Min  06/24/2013   CLINICAL DATA:  L5-S1 PLIF.  EXAM: LUMBAR SPINE - 2-3 VIEW; DG C-ARM 1-60 MIN  COMPARISON:  CT 06/17/2013.  FINDINGS: Spot frontal and lateral radiographs of the  lower lumbar spine demonstrate preexisting hardware extending inferiorly to L4. Bilateral pedicle screws have been placed at L5 and S1. There are 2 interbody spacers at L5-S1 which appear well positioned. The interconnecting rods have not yet been placed.  IMPRESSION: Intraoperative views during L5-S1 PLIF. No demonstrated complication.   Electronically Signed   By: Roxy Horseman M.D.   On: 06/24/2013 17:30    Assessment/Plan: We'll make some adjustments to her pain medication we'll keep her flat bedrest today mobilization tomorrow  LOS: 1 day     Cerys Winget P 06/25/2013, 7:00 AM

## 2013-06-25 NOTE — Progress Notes (Signed)
Utilization review completed. Siyah Mault, RN, BSN. 

## 2013-06-25 NOTE — Progress Notes (Signed)
Pt had significant amount of pain throughout the night, requiring IV dilaudid 1 mg every 2 hours; pt sts pain is almost always 8/10. Pt also reported that her back brace hasn't been delivered and refused to have foley cath removed until she gets the brace. Will follow up with biomed to find out when they will be able to bring the brace.

## 2013-06-26 ENCOUNTER — Inpatient Hospital Stay (HOSPITAL_COMMUNITY): Payer: Medicare Other

## 2013-06-26 ENCOUNTER — Encounter (HOSPITAL_COMMUNITY): Payer: Self-pay | Admitting: Radiology

## 2013-06-26 MED ORDER — HYDROMORPHONE HCL 2 MG PO TABS
2.0000 mg | ORAL_TABLET | ORAL | Status: DC | PRN
  Administered 2013-06-26 – 2013-06-28 (×11): 2 mg via ORAL
  Filled 2013-06-26 (×11): qty 1

## 2013-06-26 MED ORDER — CLONAZEPAM 0.5 MG PO TABS
0.5000 mg | ORAL_TABLET | Freq: Every day | ORAL | Status: DC
  Administered 2013-06-26 – 2013-06-30 (×5): 0.5 mg via ORAL
  Filled 2013-06-26 (×5): qty 1

## 2013-06-26 NOTE — Progress Notes (Signed)
PT Cancellation Note  Patient Details Name: Joan [name] MRN: [id number] DOB: 11/08/1945   Cancelled Treatment:    Reason Eval/Treat Not Completed: Patient not medically ready, per nsg still on bedrest until tomorrow   Fabio Asa 06/26/2013, 8:43 AM

## 2013-06-26 NOTE — Progress Notes (Signed)
Patient ID: Joan [name], female   DOB: 01-01-1946, 67 y.o.   MRN: [id number] Patient much better the pain today seems to be a combination of Robaxin and allowed is working or the steroids that time take effect  Strength out of 5 wound clean dry headache virtually gone on the talar maximum for Dilaudid 2 pills seems to be over sedating or so poor 1 patella Dilaudid we'll continue to keep her flat prison mobilize later

## 2013-06-26 NOTE — Progress Notes (Signed)
OT Cancellation Note  Patient Details Name: GRESIA ISIDORO MRN: [id number] DOB: Dec 24, 1945   Cancelled Treatment:    Reason Eval/Treat Not Completed: Patient not medically ready (bedrest flat)  Harolyn Rutherford  Pager: [phone]  06/26/2013, 8:15 AM

## 2013-06-27 MED ORDER — BISACODYL 10 MG RE SUPP
10.0000 mg | Freq: Every day | RECTAL | Status: DC | PRN
  Administered 2013-06-27: 10 mg via RECTAL
  Filled 2013-06-27 (×3): qty 1

## 2013-06-27 MED ORDER — ENOXAPARIN SODIUM 40 MG/0.4ML ~~LOC~~ SOLN
40.0000 mg | SUBCUTANEOUS | Status: DC
  Administered 2013-06-27 – 2013-06-30 (×4): 40 mg via SUBCUTANEOUS
  Filled 2013-06-27 (×5): qty 0.4

## 2013-06-27 NOTE — Progress Notes (Signed)
PT Cancellation Note  Patient Details Name: Joan [name] MRN: [id number] DOB: 09-18-1945   Cancelled Treatment:    Reason Eval/Treat Not Completed: Patient not medically ready; continued strict bedrest orders per MD. Will hold therapies.    Fabio Asa 06/27/2013, 9:45 AM Charlotte Crumb, PT DPT  [phone]

## 2013-06-27 NOTE — Progress Notes (Signed)
Pt continue bedrest with HOB flat tolerated it well and was compliance .  Some relief of h/a verbalized by pt. will Continue pain management with cold compress and medication.

## 2013-06-27 NOTE — Progress Notes (Signed)
Subjective: Patient resting in bed, head of bed at about 20, ice pack on it. Patient complaining of headache and neck pain. CT of brain last night at about 2200 shows scattered pneumocephalus, presumably due to a dural incursion during surgery. Patient continues on IV fluids.  Objective: Vital signs in last 24 hours: Filed Vitals:   06/26/13 1807 06/26/13 2119 06/27/13 0152 06/27/13 0559  BP: 132/69 128/73 129/68 117/73  Pulse: 88 105 89 95  Temp: 98 F (36.7 C) 98.9 F (37.2 C) 98.1 F (36.7 C) 97.9 F (36.6 C)  TempSrc: Oral Oral Oral Oral  Resp: 16 20 20 20   SpO2: 100% 100% 100% 95%    Intake/Output from previous day: 12/19 0701 - 12/20 0700 In: 900 [I.V.:900] Out: 3650 [Urine:3650] Intake/Output this shift:    Physical Exam:  Patient awake and alert, following commands. Moving all 4 extremities well. Dressing clean and dry.  Studies/Results: Ct Head Wo Contrast  06/26/2013   CLINICAL DATA:  Headache.  Recent lumbar fusion surgery.  EXAM: CT HEAD WITHOUT CONTRAST  TECHNIQUE: Contiguous axial images were obtained from the base of the skull through the vertex without intravenous contrast.  COMPARISON:  06/30/2010  FINDINGS: Pneumocephalus with scattered subcutaneous gas bubbles along the falx anteriorly, left frontal and bilateral temporal lobes. Cavum septum pellucidum. No acute intracranial hemorrhage, midline shift, focal parenchymal edema, mass, or mass effect. The ventricles and sulci normal in size and symmetry. Acute infarct may be inapparent on noncontrast CT. Bone windows reveal no calvarial lesion.  IMPRESSION: 1. Scattered bubbles of pneumocephalus presumably related to recent spinal surgery. 2. Negative for bleed or other acute intracranial process.   Electronically Signed   By: Oley Balm M.D.   On: 06/26/2013 22:14    Assessment/Plan: Spinal headache associated with neck pain. Spoke with patient and her nurse Darral Dash) about the importance of continuing strict  bedrest with head of bed flat at 0. Raising out of bed and mobilizing we'll exacerbate headache and neck pain. Will leave Foley in place, continue SCDs, and begin Lovenox 40 mg subcutaneous daily. We'll continue IVF.  Hewitt Shorts, MD 06/27/2013, 9:15 AM

## 2013-06-27 NOTE — Progress Notes (Signed)
I agree with the following treatment note after reviewing documentation.   Timmy Bubeck, Brynn OTR/L Pager: [phone] Office: [phone] .   

## 2013-06-27 NOTE — Progress Notes (Signed)
Pt continue to complained of spinal  headache despite administering of pain medication, compazine ,dilauded  almost around the clock. Pt not compliance of keeping head of bed flat "stating I cant eat lying flat". This further explained to pt by Dr Lovena Neighbours during MD rounding to keep [facility] flat and continue bedrest to relief the h/a. Pt verbalized understanding though not very happy about laying flat all these days. RN will closely monitor patient for compliance.

## 2013-06-28 NOTE — Progress Notes (Signed)
OT Cancellation Note  Patient Details Name: Joan [name] MRN: [id number] DOB: 09/08/1945   Cancelled Treatment:    Reason Eval/Treat Not Completed: Medical issues which prohibited therapy  Pt on bedrest.   Earlie Raveling OTR/L [phone] 06/28/2013, 2:42 PM

## 2013-06-28 NOTE — Progress Notes (Signed)
Filed Vitals:   06/27/13 1656 06/27/13 2103 06/28/13 0214 06/28/13 0635  BP: 117/70 116/74 143/75 110/69  Pulse: 88 77 93 70  Temp: 100.1 F (37.8 C) 97.9 F (36.6 C) 97.7 F (36.5 C) 98 F (36.7 C)  TempSrc: Oral Oral Oral Oral  Resp: 20 20 18 22   SpO2: 96% 97% 98% 99%    Patient continuing on bedrest, with head of bed flat. The bedside is 0, but it does look quite the head of bed is slightly op, and she is using a flat parallel. However the patient explained that her headache is doing much better today and clearly she bedrest with head of bed flat has helped. I've recommended that we continue on strict bedrest, with head of bed flat. We will continue her on Lovenox injections as well as SCDs for VTE prophylaxis.   Plan: Continue strict bedrest, with head of bed flat, with logrolling side to side. To be reassessed in a.m. by Dr. Danford Bad, MD 06/28/2013, 8:41 AM

## 2013-06-28 NOTE — Progress Notes (Signed)
PT Cancellation Note  Patient Details Name: Joan [name] MRN: [id number] DOB: [birth year]   Cancelled Treatment:    Reason Eval/Treat Not Completed: Patient not medically ready. Spoke with RN. Pt is still on bedrest at this time and continues to have headaches. Will re-attempt at next available time.    Donnamarie Poag Oral,  [phone] 06/28/2013, 7:48 AM

## 2013-06-29 NOTE — Progress Notes (Signed)
Patient ID: Joan [name], female   DOB: 1945/08/23, 67 y.o.   MRN: [id number] BP 105/69  Pulse 96  Temp(Src) 98.9 F (37.2 C) (Oral)  Resp 18  Ht 5\' 2"  (1.575 m)  Wt 64.3 kg (141 lb 12.1 oz)  BMI 25.92 kg/m2  SpO2 98% Alert and oriented x 4 Moving all extremities well Wound is clean, flat, and dry. No signs of infection Will allow her to be up tomorrow.

## 2013-06-29 NOTE — Progress Notes (Signed)
PT Cancellation Note  Patient Details Name: Joan [name] MRN: [id number] DOB: 12-25-1945   Cancelled Treatment:    Reason Eval/Treat Not Completed: Patient not medically ready, no updated orders [id number]. PT services will sign off at this time, please re-consult when medically appropriate for therapy services.   Fabio Asa 06/29/2013, 11:57 AM

## 2013-06-30 MED ORDER — OXYCODONE-ACETAMINOPHEN 5-325 MG PO TABS: 1.0000 | ORAL_TABLET | Freq: Four times a day (QID) | ORAL | Status: DC | PRN

## 2013-06-30 NOTE — Discharge Summary (Signed)
Physician Discharge Summary  Patient ID: Joan [name] MRN: [id number] DOB/AGE: 1945-10-03 67 y.o.  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 She underwent a redo discetomy and lumbar fusion at L5/S1. Post op, though there was no documentation of a csf leak, she appeared to have spinal headaches. This resolved with bedrest. At discharge she is ambulating, voiding, and tolerating a regular diet. Her wound is clean, dry, no signs of infection.   Consults: None  Significant Diagnostic Studies: none  Treatments: surgery: #1 redo decompressive lumbar laminectomy L5-S1 and excess and requiring more work than a standard interbody fusion with complete medial facetectomies radical foraminotomies of the L5 and S1 nerve roots bilaterally  #2 posterior lumbar interbody fusion L5-S1 using globus caliber expandable peek cages packed with locally harvested autograft mixed with DBX  #3 pedicle screw fixation L5-S1 using the globus Creo modular pedicle screw system with 6 5 x 40 pedicle screws at L5 and 6 5 x 35 S1  #4 posterior lateral arthrodesis L5-S1 using locally harvested autograft mixed with DBX and BMP   Discharge Exam: Blood pressure 110/69, pulse 79, temperature 98.8 F (37.1 C), temperature source Oral, resp. rate 18, height 5\' 2"  (1.575 m), weight 64.3 kg (141 lb 12.1 oz), SpO2 97.00%. General appearance: alert, cooperative and appears stated  age Neurologic: Alert and oriented X 3, normal strength and tone. Normal symmetric reflexes. Normal coordination and gait  Disposition: 01-Home or Self Care     Medication List    STOP taking these medications       HYDROcodone-acetaminophen 10-325 MG per tablet  Commonly known as:  NORCO      TAKE these medications       bisacodyl 5 MG EC tablet  Commonly known as:  DULCOLAX  Take 10 mg by mouth at bedtime.     carisoprodol 350 MG tablet  Commonly known as:  SOMA  Take 350 mg by mouth 3 (three) times daily as needed for muscle spasms. For muscle spasms     clonazePAM 1 MG tablet  Commonly known as:  KLONOPIN  TAKE ONE TABLET BY MOUTH ONCE DAILY AT BEDTIME AS NEEDED     estradiol 0.5 MG tablet  Commonly known as:  ESTRACE  Take 0.5 mg by mouth at bedtime.     Fish Oil 1200 MG Caps  Take 2,400 mg by mouth daily.     furosemide 20 MG tablet  Commonly known as:  LASIX  Take 20 mg by mouth every morning.     glucosamine-chondroitin 500-400 MG tablet  Take 2 tablets by mouth daily.     hydrochlorothiazide 25 MG tablet  Commonly known as:  HYDRODIURIL  Take 25 mg by mouth daily.     levothyroxine 88 MCG tablet  Commonly known as:  SYNTHROID, LEVOTHROID  Take 88 mcg by mouth daily before breakfast.     Melatonin 3 MG Tabs  Take 3 mg by mouth at bedtime.     omeprazole 20 MG capsule  Commonly known as:  PRILOSEC  Take  40 mg by mouth 2 (two) times daily as needed (for indigestion).     oxyCODONE-acetaminophen 5-325 MG per tablet  Commonly known as:  ROXICET  Take 1 tablet by mouth every 6 (six) hours as needed for severe pain.     potassium chloride SA 20 MEQ tablet  Commonly known as:  K-DUR,KLOR-CON  Take 60 mEq by mouth daily.     promethazine 12.5 MG tablet  Commonly known as:  PHENERGAN  Take 12.5 mg by mouth every 6 (six) hours as needed for nausea or vomiting.     simvastatin 20 MG tablet  Commonly known as:  ZOCOR  Take 20 mg by mouth at bedtime.      Vitamin B-12 2500 MCG Subl  Place 5,000 mcg under the tongue daily.     Vitamin D3 5000 UNITS Tabs  Take 5,000 Units by mouth daily.           Follow-up Information   Follow up with CRAM,GARY P, MD In 2 weeks. (call to make an appointment )    Specialty:  Neurosurgery   Contact information:   [address] [place] [address] [phone]       Signed: [employer] [name] 06/30/2013, 5:25 PM

## 2013-06-30 NOTE — Evaluation (Signed)
Physical Therapy Evaluation Patient Details Name: Joan [name] MRN: [id number] DOB: October 31, 1945 Today's Date: 06/30/2013 Time: 4098-1191 PT Time Calculation (min): 41 min  PT Assessment / Plan / Recommendation History of Present Illness  Pt is s/p posterior lumbar fusion with Spinal headache associated with neck pain.  Clinical Impression  Patient demonstrates deficits in functional mobility as indicated below. Pt will benefit from continued skilled PT to address deficits and maximize independence. Will continue to see as indicated. Rec HHPT upon discharge with family supervision and assist.      PT Assessment  Patient needs continued PT services    Follow Up Recommendations  Home health PT;Supervision/Assistance - 24 hour       Barriers to Discharge Decreased caregiver support      Equipment Recommendations  3in1 (PT)       Frequency Min 5X/week    Precautions / Restrictions Precautions Precautions: Back Precaution Booklet Issued: Yes (comment) Precaution Comments: Reviewed precautions with pt Required Braces or Orthoses: Spinal Brace Spinal Brace: Lumbar corset;Applied in sitting position Restrictions Weight Bearing Restrictions: No   Pertinent Vitals/Pain 3/10      Mobility  Bed Mobility Bed Mobility: Rolling Right;Right Sidelying to Sit;Sitting - Scoot to Edge of Bed Rolling Right: 5: Supervision Right Sidelying to Sit: 4: Min guard Sitting - Scoot to [place] of Bed: 5: Supervision Details for Bed Mobility Assistance: VCs for technique and sewuencing  Transfers Transfers: Sit to Stand;Stand to Sit Sit to Stand: 5: Supervision;From bed;From chair/3-in-1 Stand to Sit: 5: Supervision;To chair/3-in-1 Details for Transfer Assistance: VCs for hand placement and upright posture Ambulation/Gait Ambulation/Gait Assistance: 5: Supervision;4: Min guard Ambulation Distance (Feet): 320 Feet Assistive device: Rolling walker Ambulation/Gait Assistance Details: VCs for  upright posture and cadence (slow gait speed) Gait Pattern: Decreased stride length;Trunk flexed;Narrow base of support Gait velocity: decreased        PT Diagnosis: Difficulty walking;Acute pain  PT Problem List: Decreased strength;Decreased activity tolerance;Decreased balance;Decreased mobility PT Treatment Interventions: DME instruction;Gait training;Functional mobility training;Therapeutic activities;Therapeutic exercise;Balance training;Patient/family education     PT Goals(Current goals can be found in the care plan section) Acute Rehab PT Goals Patient Stated Goal: to go home  PT Goal Formulation: With patient Time For Goal Achievement: 07/14/13 Potential to Achieve Goals: Good  Visit Information  Last PT Received On: 06/30/13 Assistance Needed: +1 PT/OT/SLP Co-Evaluation/Treatment: Yes (partial session co treat, dove tailed remainder of session) Reason for Co-Treatment: Complexity of the patient's impairments (multi-system involvement) PT goals addressed during session: Mobility/safety with mobility OT goals addressed during session: ADL's and self-care;Proper use of Adaptive equipment and DME History of Present Illness: Pt is s/p posterior lumbar fusion with Spinal headache associated with neck pain.       Prior Functioning  Home Living Family/patient expects to be discharged to:: Private residence ("son will stay w/me a week post op; go home for a couple days then back w/me for a week") Living Arrangements: Alone Available Help at Discharge: Family Type of Home: Apartment Home Access: Stairs to enter [employer] of Steps: 1 Entrance Stairs-Rails: Left;Right Home Layout: Two level;Able to live on main level with bedroom/bathroom Alternate Level Stairs-Number of Steps: 14 Alternate Level Stairs-Rails: Right;Left Home Equipment: Walker - 2 wheels;Shower seat - built in Additional Comments: walk in shower with built in shower seat, standard height  commode Prior Function Level of Independence: Independent Communication Communication: No difficulties Dominant Hand: Right    Cognition  Cognition Arousal/Alertness: Awake/alert Behavior During Therapy: WFL for tasks assessed/performed  Overall Cognitive Status: Within Functional Limits for tasks assessed    Extremity/Trunk Assessment Upper Extremity Assessment Upper Extremity Assessment: Overall WFL for tasks assessed Lower Extremity Assessment Lower Extremity Assessment: Defer to PT evaluation   Balance High Level Balance High Level Balance Activites: Side stepping;Backward walking;Direction changes;Sudden stops High Level Balance Comments: min guard supervision, cues for compliance with precatuions  End of Session PT - End of Session Equipment Utilized During Treatment: Gait belt Activity Tolerance: Patient tolerated treatment well Patient left: in chair;with call bell/phone within reach Nurse Communication: Mobility status  GP     Fabio Asa 06/30/2013, 12:43 PM Charlotte Crumb, PT DPT  [phone]

## 2013-06-30 NOTE — Progress Notes (Signed)
Patient having urinary retention problem after foley cath discontinued. Dr. Franky Macho notified,  received  order  to cancel discharge to home tonight. Will continue to monitor.

## 2013-06-30 NOTE — Evaluation (Signed)
Occupational Therapy Evaluation Patient Details Name: Joan [name] MRN: [id number] DOB: December 19, 1945 Today's Date: 06/30/2013 Time: [id number] (times added for when OT was in room and time spent together with PT-dovetailed in session) OT Time Calculation (min): 32 min  OT Assessment / Plan / Recommendation History of present illness Pt is s/p posterior lumbar fusion with Spinal headache associated with neck pain.   Clinical Impression   Pt presents with below problem list. Pt independent with ADLs, PTA. Pt will benefit from acute OT to increase independence prior to d/c.     OT Assessment  Patient needs continued OT Services    Follow Up Recommendations  Home health OT;Supervision/Assistance - 24 hour    Barriers to Discharge  Decreased caregiver support    Equipment Recommendations  3 in 1 bedside comode;Other (comment) (AE kit)    Recommendations for Other Services    Frequency  Min 2X/week    Precautions / Restrictions Precautions Precautions: Back Precaution Booklet Issued: Yes (comment) Precaution Comments: Reviewed precautions with pt Required Braces or Orthoses: Spinal Brace Spinal Brace: Lumbar corset;Applied in sitting position Restrictions Weight Bearing Restrictions: No   Pertinent Vitals/Pain Pain 2/10. Repositioned.     ADL  Grooming: Teeth care;Set up;Supervision/safety Where Assessed - Grooming: Supported standing Upper Body Dressing: Set up;Supervision/safety (back brace) Where Assessed - Upper Body Dressing: Unsupported sitting Lower Body Dressing: Minimal assistance Where Assessed - Lower Body Dressing: Supported sit to stand Toilet Transfer: Supervision/safety Statistician Method: Sit to Barista: Raised toilet seat with arms (or 3-in-1 over toilet) Tub/Shower Transfer: Insurance risk surveyor Method: Science writer: Walk in shower;Other (comment) (3 in 1 (did not use arm  rests)) Equipment Used: Gait belt;Back brace;Rolling walker;Sock aid;Long-handled sponge;Reacher Transfers/Ambulation Related to ADLs: Min guard/supervision for ambulation and Supervision for sit <> stand transfers. Min guard for shower transfer. ADL Comments: Educated on use of cups for teeth care as well as setting grooming items on dominant side of sink to avoid breaking precautions.  Educated on AE for LB ADLs and pt practiced donning/doffing socks with reacher and sockaid. Educated on toilet aid for hygiene.     OT Diagnosis: Acute pain  OT Problem List: Decreased strength;Decreased activity tolerance;Impaired balance (sitting and/or standing);Decreased knowledge of use of DME or AE;Decreased knowledge of precautions;Pain OT Treatment Interventions: Self-care/ADL training;Therapeutic exercise;Patient/family education;Balance training;DME and/or AE instruction   OT Goals(Current goals can be found in the care plan section) Acute Rehab OT Goals Patient Stated Goal: to go home  OT Goal Formulation: With patient Time For Goal Achievement: 07/07/13 Potential to Achieve Goals: Good ADL Goals Pt Will Perform Grooming: with modified independence;standing Pt Will Perform Lower Body Bathing: with modified independence;with adaptive equipment;sit to/from stand Pt Will Perform Lower Body Dressing: with modified independence;with adaptive equipment;sit to/from stand Pt Will Transfer to Toilet: with modified independence;ambulating (3 in 1 over commode) Pt Will Perform Toileting - Clothing Manipulation and hygiene: with modified independence;sit to/from stand Additional ADL Goal #1: Pt will independently verbalize and demonstrate 3/3 back precautions.  Additional ADL Goal #2: Pt will independently don/doff back brace while maintaining back precautions.   Visit Information  Last OT Received On: 06/30/13 Assistance Needed: +1 PT/OT/SLP Co-Evaluation/Treatment: Yes (partial session co treat, dove  tailed remainder of session) Reason for Co-Treatment: Complexity of the patient's impairments (multi-system involvement) PT goals addressed during session: Mobility/safety with mobility OT goals addressed during session: ADL's and self-care;Proper use of Adaptive equipment and DME History of Present Illness: Pt  is s/p posterior lumbar fusion with Spinal headache associated with neck pain.       Prior Functioning     Home Living Family/patient expects to be discharged to:: Private residence ("son will stay w/me a week post op; go home for a couple days then back w/me for a week") Living Arrangements: Alone Available Help at Discharge: Family Type of Home: Apartment Home Access: Stairs to enter [employer] of Steps: 1 Entrance Stairs-Rails: Left;Right Home Layout: Two level;Able to live on main level with bedroom/bathroom Alternate Level Stairs-Number of Steps: 14 Alternate Level Stairs-Rails: Right;Left Home Equipment: Walker - 2 wheels;Shower seat - built in Additional Comments: walk in shower with built in shower seat, standard height commode Prior Function Level of Independence: Independent Communication Communication: No difficulties Dominant Hand: Right         Vision/Perception Vision - History Baseline Vision: Wears glasses all the time   Cognition  Cognition Arousal/Alertness: Awake/alert Behavior During Therapy: WFL for tasks assessed/performed Overall Cognitive Status: Within Functional Limits for tasks assessed    Extremity/Trunk Assessment Upper Extremity Assessment Upper Extremity Assessment: Overall WFL for tasks assessed Lower Extremity Assessment Lower Extremity Assessment: Defer to PT evaluation     Mobility Bed Mobility Bed Mobility: Rolling Right;Right Sidelying to Sit;Sitting - Scoot to Edge of Bed Rolling Right: 5: Supervision Right Sidelying to Sit: 4: Min guard Sitting - Scoot to [place] of Bed: 5: Supervision Details for Bed  Mobility Assistance: VCs for technique and sewuencing  Transfers Transfers: Sit to Stand;Stand to Sit Sit to Stand: 5: Supervision;From bed;From chair/3-in-1 Stand to Sit: 5: Supervision;To chair/3-in-1 Details for Transfer Assistance: VCs for hand placement and upright posture           End of Session OT - End of Session Equipment Utilized During Treatment: Gait belt;Rolling walker;Back brace Activity Tolerance: Patient tolerated treatment well Patient left: in chair;with call bell/phone within reach  [employer] OTR/L [phone] 06/30/2013, 1:24 PM

## 2013-07-01 NOTE — Progress Notes (Signed)
Patient voided @0400  with PVR of , @0620  patient voided with PVR of . In and out cath done PRN as ordered and drained of clear yellow urine. Will continue to monitor.

## 2013-07-01 NOTE — Progress Notes (Addendum)
Occupational Therapy Treatment Patient Details Name: Joan [name] MRN: [id number] DOB: 03/08/1946 Today's Date: 07/01/2013 Time: 1191-4782 OT Time Calculation (min): 29 min  OT Assessment / Plan / Recommendation  History of present illness Pt is s/p posterior lumbar fusion with Spinal headache associated with neck pain.   OT comments  OT provided education to pt and had her practice with AE. Pt having pain in right hip today.   Follow Up Recommendations  Home health OT;Supervision/Assistance - 24 hour    Barriers to Discharge       Equipment Recommendations  3 in 1 bedside comode;Other (comment) (AE kit along with toilet aid)    Recommendations for Other Services    Frequency Min 2X/week   Progress towards OT Goals Progress towards OT goals: Progressing toward goals  Plan      Precautions / Restrictions Precautions Precautions: Back Precaution Comments: Pt able to state 3/3 back precautions. Required Braces or Orthoses: Spinal Brace Spinal Brace: Lumbar corset;Applied in sitting position   Pertinent Vitals/Pain Pain 9/10 in right hip. Increased activity during session. Repositioned.     ADL  Grooming: Teeth care;Set up;Supervision/safety Where Assessed - Grooming: Supported standing Upper Body Dressing: Minimal assistance Where Assessed - Upper Body Dressing: Unsupported sitting Lower Body Dressing: Minimal assistance Where Assessed - Lower Body Dressing: Supported sit to stand Toilet Transfer: Hydrographic surveyor Method: Sit to Barista: Other (comment) (from recliner chair) Equipment Used: Gait belt;Rolling walker;Sock aid;Reacher;Long-handled shoe horn Transfers/Ambulation Related to ADLs: Min guard ADL Comments: Pt performed UB/LB dressing. Gave pt elastic shoe laces. Cues for precautions during session.  Educated on use of bag on walker and to stand in front of chair/bed with walker in front when pulling up LB clothing.  Recommended  pt wearing brace until she sits down in shower and then to doff it, but pt states she is not doing this because she has to warm the water first. OT told pt to ask doctor about this before leaving. Discussed wearing button up shirts or larger shirts to avoid raising arms too high.   OT Diagnosis:    OT Problem List:   OT Treatment Interventions:     OT Goals(current goals can now be found in the care plan section) Acute Rehab OT Goals Patient Stated Goal: to go home  OT Goal Formulation: With patient Time For Goal Achievement: 07/07/13 Potential to Achieve Goals: Good ADL Goals Pt Will Perform Grooming: with modified independence;standing Pt Will Perform Lower Body Bathing: with modified independence;with adaptive equipment;sit to/from stand Pt Will Perform Lower Body Dressing: with modified independence;with adaptive equipment;sit to/from stand Pt Will Transfer to Toilet: with modified independence;ambulating (3 in 1 over commode) Pt Will Perform Toileting - Clothing Manipulation and hygiene: with modified independence;sit to/from stand Additional ADL Goal #1: Pt will independently verbalize and demonstrate 3/3 back precautions.  Additional ADL Goal #2: Pt will independently don/doff back brace while maintaining back precautions.   Visit Information  Last OT Received On: 07/01/13 Assistance Needed: +1 History of Present Illness: Pt is s/p posterior lumbar fusion with Spinal headache associated with neck pain.    Subjective Data      Prior Functioning       Cognition  Cognition Arousal/Alertness: Awake/alert Behavior During Therapy: WFL for tasks assessed/performed Overall Cognitive Status: Within Functional Limits for tasks assessed    Mobility  Bed Mobility Bed Mobility: Sit to Sidelying Left;Rolling Right Rolling Right: 4: Min guard Sit to Sidelying Left: 3: Mod  assist Details for Bed Mobility Assistance: Assisted getting LE's onto bed. Cues for  technique. Transfers Transfers: Sit to Stand;Stand to Sit Sit to Stand: 4: Min guard;With upper extremity assist;From chair/3-in-1 Stand to Sit: 4: Min guard;With upper extremity assist;To bed;To chair/3-in-1 Details for Transfer Assistance: Min guard for safety.    Exercises      Balance     End of Session OT - End of Session Equipment Utilized During Treatment: Gait belt;Rolling walker;Back brace Activity Tolerance: Patient tolerated treatment well Patient left: in bed;with call bell/phone within reach Nurse Communication: Other (comment) (recommending HHOT)  GO     Earlie Raveling  OTR/L [phone]  07/01/2013, 12:26 PM

## 2013-07-01 NOTE — Progress Notes (Signed)
Physical Therapy Treatment Patient Details Name: Joan [name] MRN: [id number] DOB: [birth year] Today's Date: 07/01/2013 Time: 1012-1039 PT Time Calculation (min): 27 min  PT Assessment / Plan / Recommendation  History of Present Illness Pt is s/p posterior lumbar fusion with Spinal headache associated with neck pain.   PT Comments   Patient educated on techniques for car transfers and mobility. Patient demonstrates continued pain in R hip (baseline). Spoke with patient regarding mobility expectations for discharge and need for assist to ensure compliance with precautions. Patient appreciative and hopeful for dc home today.   Follow Up Recommendations  Home health PT;Supervision/Assistance - 24 hour           Equipment Recommendations  3in1 (PT)       Frequency Min 5X/week   Progress towards PT Goals Progress towards PT goals: Progressing toward goals  Plan Current plan remains appropriate    Precautions / Restrictions Precautions Precautions: Back Precaution Booklet Issued: Yes (comment) Precaution Comments: educated and reviewed via teachback throughout session Required Braces or Orthoses: Spinal Brace Spinal Brace: Lumbar corset Restrictions Weight Bearing Restrictions: No   Pertinent Vitals/Pain 6/10    Mobility  Bed Mobility Bed Mobility: Rolling Right;Right Sidelying to Sit;Sitting - Scoot to Edge of Bed Rolling Right: 5: Supervision Right Sidelying to Sit: 5: Supervision Sitting - Scoot to Edge of Bed: 5: Supervision Sit to Sidelying Left: 3: Mod assist Details for Bed Mobility Assistance: VCs for technique and sewuencing  Transfers Transfers: Sit to Stand;Stand to Sit Sit to Stand: 5: Supervision Stand to Sit: 5: Supervision Details for Transfer Assistance: VCs for hand placement and upright posture Ambulation/Gait Ambulation/Gait Assistance: 5: Supervision;4: Min guard Ambulation Distance (Feet): 240 Feet Assistive device: Rolling  walker Ambulation/Gait Assistance Details: Increase right hip pain limiting ambulation today Gait Pattern: Decreased stride length;Trunk flexed;Narrow base of support Gait velocity: decreased      PT Goals (current goals can now be found in the care plan section) Acute Rehab PT Goals Patient Stated Goal: to go home  PT Goal Formulation: With patient Time For Goal Achievement: 07/14/13 Potential to Achieve Goals: Good  Visit Information  Last PT Received On: 07/01/13 Assistance Needed: +1 History of Present Illness: Pt is s/p posterior lumbar fusion with Spinal headache associated with neck pain.    Subjective Data  Patient Stated Goal: to go home    Cognition  Cognition Arousal/Alertness: Awake/alert Behavior During Therapy: WFL for tasks assessed/performed Overall Cognitive Status: Within Functional Limits for tasks assessed    Balance  High Level Balance High Level Balance Activites: Side stepping;Backward walking;Direction changes;Sudden stops High Level Balance Comments: improved stability today  End of Session PT - End of Session Equipment Utilized During Treatment: Gait belt Activity Tolerance: Patient tolerated treatment well Patient left: in chair;with call bell/phone within reach Nurse Communication: Mobility status   GP     Fabio Asa 07/01/2013, 2:13 PM Charlotte Crumb, PT DPT  [phone]

## 2013-07-01 NOTE — Progress Notes (Signed)
Talked to patient about discharge planning/ HHC choices, patient chose [facility] for HHPT; Mary with [facility] called for arrangements; Darian with [facility] called for 3:1 to be delivered to the room prior to discharge; Abelino Derrick RN,BSN,MHA [phone]

## 2013-07-01 NOTE — Progress Notes (Signed)
Patient education and paperwork complete.  No IV to remove.  Pt. Son here to drive patient home.  Lance Bosch, RN

## 2013-07-06 ENCOUNTER — Ambulatory Visit: Payer: Medicare Other | Admitting: Internal Medicine

## 2013-07-30 ENCOUNTER — Telehealth: Payer: Self-pay | Admitting: Internal Medicine

## 2013-07-30 NOTE — Telephone Encounter (Signed)
Pt needs rx for hydrochlorothiazide 25 mg and omeprazole 20 mg 90 day supply, to [facility]

## 2013-07-31 MED ORDER — OMEPRAZOLE 20 MG PO CPDR: 40.0000 mg | DELAYED_RELEASE_CAPSULE | Freq: Two times a day (BID) | ORAL | Status: DC | PRN

## 2013-07-31 MED ORDER — HYDROCHLOROTHIAZIDE 25 MG PO TABS: 25.0000 mg | ORAL_TABLET | Freq: Every day | ORAL | Status: DC

## 2013-07-31 NOTE — Telephone Encounter (Signed)
rx sent in electronically 

## 2013-08-14 ENCOUNTER — Encounter: Payer: Self-pay | Admitting: *Deleted

## 2013-08-17 ENCOUNTER — Encounter: Payer: Self-pay | Admitting: Internal Medicine

## 2013-08-17 ENCOUNTER — Ambulatory Visit (INDEPENDENT_AMBULATORY_CARE_PROVIDER_SITE_OTHER): Payer: Medicare Other | Admitting: Internal Medicine

## 2013-08-17 VITALS — BP 114/76 | HR 72 | Temp 98.0°F | Ht 62.0 in | Wt 136.0 lb

## 2013-08-17 DIAGNOSIS — D649 Anemia, unspecified: Secondary | ICD-10-CM

## 2013-08-17 DIAGNOSIS — E039 Hypothyroidism, unspecified: Secondary | ICD-10-CM

## 2013-08-17 DIAGNOSIS — G4700 Insomnia, unspecified: Secondary | ICD-10-CM

## 2013-08-17 DIAGNOSIS — F17200 Nicotine dependence, unspecified, uncomplicated: Secondary | ICD-10-CM

## 2013-08-17 DIAGNOSIS — I10 Essential (primary) hypertension: Secondary | ICD-10-CM

## 2013-08-17 LAB — BASIC METABOLIC PANEL
BUN: 16 mg/dL (ref 6–23)
CO2: 27 mEq/L (ref 19–32)
Calcium: 8.2 mg/dL — ABNORMAL LOW (ref 8.4–10.5)
Chloride: 103 mEq/L (ref 96–112)
Creatinine, Ser: 0.6 mg/dL (ref 0.4–1.2)
GFR: 101.51 mL/min (ref >60.00)
Glucose, Bld: 105 mg/dL — ABNORMAL HIGH (ref 70–99)
Potassium: 3.8 mEq/L (ref 3.5–5.1)
Sodium: 137 mEq/L (ref 135–145)

## 2013-08-17 LAB — TSH: TSH: 0.91 u[IU]/mL (ref 0.35–5.50)

## 2013-08-17 LAB — CBC WITH DIFFERENTIAL/PLATELET
Basophils Absolute: 0 10*3/uL (ref 0.0–0.1)
Basophils Relative: 0.5 % (ref 0.0–3.0)
Eosinophils Absolute: 0 10*3/uL (ref 0.0–0.7)
Eosinophils Relative: 0.8 % (ref 0.0–5.0)
HCT: 39.8 % (ref 36.0–46.0)
Hemoglobin: 13.1 g/dL (ref 12.0–15.0)
Lymphocytes Relative: 34.2 % (ref 12.0–46.0)
Lymphs Abs: 1.7 10*3/uL (ref 0.7–4.0)
MCHC: 32.8 g/dL (ref 30.0–36.0)
MCV: 93.1 fl (ref 78.0–100.0)
Monocytes Absolute: 0.4 10*3/uL (ref 0.1–1.0)
Monocytes Relative: 8.4 % (ref 3.0–12.0)
Neutro Abs: 2.8 10*3/uL (ref 1.4–7.7)
Neutrophils Relative %: 56.1 % (ref 43.0–77.0)
Platelets: 189 10*3/uL (ref 150.0–400.0)
RBC: 4.28 Mil/uL (ref 3.87–5.11)
RDW: 15 % — ABNORMAL HIGH (ref 11.5–14.6)
WBC: 4.9 10*3/uL (ref 4.5–10.5)

## 2013-08-17 MED ORDER — CLONAZEPAM 1 MG PO TABS: ORAL_TABLET | ORAL | Status: DC

## 2013-08-17 MED ORDER — OMEPRAZOLE 40 MG PO CPDR: 40.0000 mg | DELAYED_RELEASE_CAPSULE | Freq: Every day | ORAL | Status: DC

## 2013-08-17 MED ORDER — VARENICLINE TARTRATE 1 MG PO TABS: 1.0000 mg | ORAL_TABLET | Freq: Two times a day (BID) | ORAL | Status: DC

## 2013-08-17 MED ORDER — VARENICLINE TARTRATE 0.5 MG X 11 & 1 MG X 42 PO MISC: ORAL | Status: DC

## 2013-08-17 MED ORDER — POTASSIUM CHLORIDE CRYS ER 20 MEQ PO TBCR: 60.0000 meq | EXTENDED_RELEASE_TABLET | Freq: Every day | ORAL | Status: DC

## 2013-08-17 NOTE — Progress Notes (Signed)
Subjective:    Patient ID: Joan [name], female    DOB: 1945-11-29, 68 y.o.   MRN: [id number]  HPI  68 year old white female with history of hypertension, hypothyroidism lumbar stenosis for followup. Interval medical history-patient underwent lumbar fusion surgery in December of 2014. Patient reports surgery was complicated by cerebrospinal fluid leak. She was hospitalized for 7 days. Overall her low back pain has significantly improved, however her activities are still limited.  Patient has been taking muscle relaxer (Soma) 3 times a day as needed. Patient complains of somnolence. She still takes clonazepam 1 mg at bedtime as needed for sleep.  Hypothyroidism-patient complains of fatigue.  Tobacco use - still smoking over 1 pack per day.  Review of Systems Weight is stable.  Negative for chest pain or cough    Past Medical History  Diagnosis Date  . Chronic low back pain     followed by Dr Hardin Negus pain mgt  . Hyperlipemia   . Tobacco abuse   . Hypothyroidism   . History of cardiac arrhythmia     cardiologist- Traci Turner  . Esophageal stricture   . Thyroid nodule   . Colon polyp   . GERD (gastroesophageal reflux disease)   . Dysrhythmia 2008    IRREG     . PONV (postoperative nausea and vomiting)     Pt reports symptoms are the result of gallbladder and cholecystectomy, not anesthesia  . Heart murmur     "slight; not on RX" (06/24/2013)  . Sleep apnea 1990's    "tested; tried mask; couldn't stand it; told me as long as I slept on my side I'd be ok" (06/24/2013)  . Osteoarthritis of left knee     advanced  . Arthritis     "shoulders; wrist; probably spine" (06/24/2013)    History   Social History  . Marital Status: Divorced    Spouse Name: N/A    Number of Children: N/A  . Years of Education: N/A   Occupational History  . Not on file.   Social History Main Topics  . Smoking status: Current Every Day Smoker -- 1.00 packs/day for 35 years    Types:  Cigarettes  . Smokeless tobacco: Never Used  . Alcohol Use: No  . Drug Use: No  . Sexual Activity: Not Currently   Other Topics Concern  . Not on file   Social History Narrative   Divorced   Current Smoker  1 ppd -  20 yrs      Alcohol use-no       [employer] - laid off     Past Surgical History  Procedure Laterality Date  . Lumbar fusion      and rods  . Cesarean section  1972  . Ankle surgery Left 1995    "tendon repair" (06/24/2013)  . Infusion pump implantation  1990's    "implantablet morphine pump; took it out w/in 11 months  . Knee arthroscopy Left 1991; ~ 1993  . Elbow surgery  1990's  . Thyroidectomy  2012  . Cervical spine surgery  2012  . Foot surgery Right 2012    SPUR REMOVED  . Shoulder arthroscopy Left 09/2011  . Lumbar laminectomy/decompression microdiscectomy N/A 11/28/2012    Procedure: DECOMPRESSIVE LUMBAR LAMINECTOMY LEVEL 1;  Surgeon: Elaina Hoops, MD;  Location: [place] [facility];  Service: Neurosurgery;  Laterality: N/A;  DECOMPRESSIVE LUMBAR LAMINECTOMY LEVEL 1  . Back surgery      "think today was my 8th  back OR" (06/24/2013)  . Cholecystectomy N/A 04/16/2013    Procedure: LAPAROSCOPIC CHOLECYSTECTOMY ;  Surgeon: Imogene Burn. Georgette Dover, MD;  Location: [facility];  Service: General;  Laterality: N/A;  . Laparoscopic lysis of adhesions N/A 04/16/2013    Procedure: LAPAROSCOPIC LYSIS OF ADHESIONS;  Surgeon: Imogene Burn. Tsuei, MD;  Location: [facility];  Service: General;  Laterality: N/A;  . Posterior fusion lumbar spine  06/24/2013  . Abdominal hysterectomy  1988    "partial" (06/24/2013)    Family History  Problem Relation Age of Onset  . Pancreatic cancer Father     deceased age 30  . Cancer Father   . Diabetes type II Mother     deceased age 30  . Hypertension Mother   . Coronary artery disease Mother   . Diabetes Mother   . Cancer Mother     Thyroid and Skin  . Diabetes Sister   . Hypertension Sister   . Cancer Sister     Breast  . Other  Neg Hx     No family history of  colon cancer  . Hypertension Brother     Allergies  Allergen Reactions  . Amitriptyline Other (See Comments)    Leg swelling  . Gabapentin     REACTION: Itching Ask patient to clarify "Neurogan" on history form dated 09/25/10.  . Triamterene Itching and Rash    Current Outpatient Prescriptions on File Prior to Visit  Medication Sig Dispense Refill  . bisacodyl (DULCOLAX) 5 MG EC tablet Take 10 mg by mouth at bedtime.   30 tablet    . carisoprodol (SOMA) 350 MG tablet Take 350 mg by mouth 3 (three) times daily as needed for muscle spasms. For muscle spasms      . Cholecalciferol (VITAMIN D3) 5000 UNITS TABS Take 5,000 Units by mouth daily.       . Cyanocobalamin (VITAMIN B-12) 2500 MCG SUBL Place 5,000 mcg under the tongue daily.       Marland Kitchen estradiol (ESTRACE) 0.5 MG tablet Take 0.5 mg by mouth at bedtime.       . furosemide (LASIX) 20 MG tablet Take 20 mg by mouth every morning.      Marland Kitchen glucosamine-chondroitin 500-400 MG tablet Take 2 tablets by mouth daily.      . hydrochlorothiazide (HYDRODIURIL) 25 MG tablet Take 1 tablet (25 mg total) by mouth daily.  90 tablet  1  . levothyroxine (SYNTHROID, LEVOTHROID) 88 MCG tablet Take 88 mcg by mouth daily before breakfast.      . Omega-3 Fatty Acids (FISH OIL) 1200 MG CAPS Take 2,400 mg by mouth daily.      . promethazine (PHENERGAN) 12.5 MG tablet Take 12.5 mg by mouth every 6 (six) hours as needed for nausea or vomiting.      . simvastatin (ZOCOR) 20 MG tablet Take 20 mg by mouth at bedtime.       No current facility-administered medications on file prior to visit.    BP 114/76  Pulse 72  Temp(Src) 98 F (36.7 C) (Oral)  Ht 5\' 2"  (1.575 m)  Wt 136 lb (61.689 kg)  BMI 24.87 kg/m2    Objective:   Physical Exam  Constitutional: She is oriented to person, place, and time. She appears well-developed and well-nourished. No distress.  Cardiovascular: Normal rate, regular rhythm and normal heart sounds.     No murmur heard. Pulmonary/Chest: Effort normal and breath sounds normal. She has no wheezes.  Musculoskeletal: She exhibits no edema.  Neurological:  She is alert and oriented to person, place, and time. No cranial nerve deficit.  Skin: Skin is warm and dry.  Psychiatric: She has a normal mood and affect. Her behavior is normal.          Assessment & Plan:

## 2013-08-17 NOTE — Assessment & Plan Note (Addendum)
Stable.  She takes lasix as needed.  Monitor electrolytes and kidney function. BP: 114/76 mmHg  Lab Results  Component Value Date   CREATININE 0.6 08/17/2013   Lab Results  Component Value Date   NA 137 08/17/2013   K 3.8 08/17/2013   CL 103 08/17/2013   CO2 27 08/17/2013

## 2013-08-17 NOTE — Assessment & Plan Note (Signed)
Tobacco cessation strongly encouraged.  Use Chantix as directed.

## 2013-08-17 NOTE — Assessment & Plan Note (Addendum)
Monitor TSH.  I suspect her fatigue may be side effect of SOMA.  Check CBCD. Lab Results  Component Value Date   TSH 0.91 08/17/2013

## 2013-08-17 NOTE — Patient Instructions (Signed)
Try to taper off estradiol It is very important that you quit smoking

## 2013-08-17 NOTE — Assessment & Plan Note (Signed)
Patient advised to take lower dose of clonazepam especially when she is using muscle relaxer (Soma) to avoid excessive somnolence

## 2013-08-17 NOTE — Progress Notes (Signed)
Pre visit review using our clinic review tool, if applicable. No additional management support is needed unless otherwise documented below in the visit note. 

## 2013-08-18 ENCOUNTER — Telehealth: Payer: Self-pay | Admitting: Internal Medicine

## 2013-08-18 NOTE — Telephone Encounter (Signed)
Relevant patient education assigned to patient using Emmi. ° °

## 2013-08-26 ENCOUNTER — Other Ambulatory Visit: Payer: Medicare Other

## 2013-08-26 ENCOUNTER — Telehealth: Payer: Self-pay | Admitting: Internal Medicine

## 2013-08-26 MED ORDER — LEVOTHYROXINE SODIUM 88 MCG PO TABS
88.0000 ug | ORAL_TABLET | Freq: Every day | ORAL | Status: DC
Start: 2013-08-26 — End: 2013-12-09

## 2013-08-26 NOTE — Telephone Encounter (Signed)
rx sent in electronically to [facility], pt stated she will discuss with Dr Shawna Orleans at next office visit.  She is still fatigued

## 2013-08-26 NOTE — Telephone Encounter (Signed)
Call pt - recent blood test shows she is getting appropriate dose for thyroid replacement at 88 mcg.  I do not recommend taking higher dose.

## 2013-08-26 NOTE — Telephone Encounter (Signed)
Pt would like new rx levothyroxine 100 mcg#90 w/refills sent to [facility] instead of 88 mcg. Pt stated she discuss with md on last visit

## 2013-08-26 NOTE — Telephone Encounter (Signed)
Please advise, I don't see anything documented about dose change

## 2013-09-04 ENCOUNTER — Other Ambulatory Visit: Payer: Medicare Other

## 2013-09-08 ENCOUNTER — Encounter: Payer: Self-pay | Admitting: Internal Medicine

## 2013-09-09 ENCOUNTER — Other Ambulatory Visit (INDEPENDENT_AMBULATORY_CARE_PROVIDER_SITE_OTHER): Payer: Medicare Other

## 2013-09-09 DIAGNOSIS — I10 Essential (primary) hypertension: Secondary | ICD-10-CM

## 2013-09-09 LAB — BASIC METABOLIC PANEL
BUN: 13 mg/dL (ref 6–23)
CO2: 28 mEq/L (ref 19–32)
Calcium: 8.6 mg/dL (ref 8.4–10.5)
Chloride: 99 mEq/L (ref 96–112)
Creatinine, Ser: 0.6 mg/dL (ref 0.4–1.2)
GFR: 105.41 mL/min (ref >60.00)
Glucose, Bld: 86 mg/dL (ref 70–99)
Potassium: 3.5 mEq/L (ref 3.5–5.1)
Sodium: 136 mEq/L (ref 135–145)

## 2013-09-17 ENCOUNTER — Encounter: Payer: Self-pay | Admitting: Internal Medicine

## 2013-09-17 MED ORDER — SIMVASTATIN 20 MG PO TABS: 20.0000 mg | ORAL_TABLET | Freq: Every day | ORAL | Status: DC

## 2013-10-07 ENCOUNTER — Telehealth: Payer: Self-pay | Admitting: Internal Medicine

## 2013-10-07 MED ORDER — OMEPRAZOLE 40 MG PO CPDR: 40.0000 mg | DELAYED_RELEASE_CAPSULE | Freq: Two times a day (BID) | ORAL | Status: DC

## 2013-10-07 NOTE — Telephone Encounter (Signed)
rx sent in electronically 

## 2013-10-07 NOTE — Telephone Encounter (Signed)
Pt is needing new rx for omeprazole (PRILOSEC) 40 MG capsule, sent to [facility]. Pt states dr. Dennis Bast had written her rx for 20 mg twice daily and the insurance will not pay for it. Pt needs rx to state 40mg  once daily.

## 2013-10-16 ENCOUNTER — Ambulatory Visit (INDEPENDENT_AMBULATORY_CARE_PROVIDER_SITE_OTHER): Payer: Medicare Other | Admitting: Internal Medicine

## 2013-10-16 ENCOUNTER — Encounter: Payer: Self-pay | Admitting: Internal Medicine

## 2013-10-16 VITALS — BP 120/78 | HR 72 | Temp 98.0°F | Ht 62.0 in | Wt 144.0 lb

## 2013-10-16 DIAGNOSIS — E785 Hyperlipidemia, unspecified: Secondary | ICD-10-CM

## 2013-10-16 DIAGNOSIS — E039 Hypothyroidism, unspecified: Secondary | ICD-10-CM

## 2013-10-16 DIAGNOSIS — I10 Essential (primary) hypertension: Secondary | ICD-10-CM

## 2013-10-16 MED ORDER — HYDROCHLOROTHIAZIDE 25 MG PO TABS: 25.0000 mg | ORAL_TABLET | Freq: Every day | ORAL | Status: DC

## 2013-10-16 MED ORDER — FUROSEMIDE 20 MG PO TABS: 20.0000 mg | ORAL_TABLET | Freq: Every morning | ORAL | Status: DC

## 2013-10-16 NOTE — Assessment & Plan Note (Signed)
Patient start function tests are stable. Continue same dose of thyroid replacement.  We had to discuss physiology of hypothyroidism.

## 2013-10-16 NOTE — Progress Notes (Signed)
Pre visit review using our clinic review tool, if applicable. No additional management support is needed unless otherwise documented below in the visit note. 

## 2013-10-16 NOTE — Assessment & Plan Note (Signed)
Monitor FLP and LFTs. 

## 2013-10-16 NOTE — Patient Instructions (Addendum)
Please complete the following lab tests before your next follow up appointment: BMET - 401.9 TSH - 244.9 FLP, LFTs - 272.4 

## 2013-10-16 NOTE — Progress Notes (Signed)
Subjective:    Patient ID: Joan [name], female    DOB: 22-Dec-1945, 68 y.o.   MRN: [id number]  HPI  68 year old white female with history of hypertension, tobacco use and low back surgery for followup. Patient reports exacerbation of severe left-sided back pain. She was seen by her neurosurgeon and treated with course of prednisone. Patient reports back pain flare has improved.  Chronic insomnia-she still has issues sleeping more than 3-4 hours per evening. She currently uses clonazepam 0.5 mg at bedtime as needed.  HRT-patient discussed tapering off estradiol with her gynecologist. She is currently taking 0.25 mg of estradiol.  Hypertension-she still has intermittent edema and she is using furosemide as needed. She reports taking her potassium supplementation as directed.  Patient did not start Chantix-medication was cost prohibitive Review of Systems Negative for chest pain, negative for cough Past Medical History  Diagnosis Date  . Chronic low back pain     followed by Dr Hardin Negus pain mgt  . Hyperlipemia   . Tobacco abuse   . Hypothyroidism   . History of cardiac arrhythmia     cardiologist- Traci Turner  . Esophageal stricture   . Thyroid nodule   . Colon polyp   . GERD (gastroesophageal reflux disease)   . Dysrhythmia 2008    IRREG     . PONV (postoperative nausea and vomiting)     Pt reports symptoms are the result of gallbladder and cholecystectomy, not anesthesia  . Heart murmur     "slight; not on RX" (06/24/2013)  . Sleep apnea 1990's    "tested; tried mask; couldn't stand it; told me as long as I slept on my side I'd be ok" (06/24/2013)  . Osteoarthritis of left knee     advanced  . Arthritis     "shoulders; wrist; probably spine" (06/24/2013)    History   Social History  . Marital Status: Divorced    Spouse Name: N/A    Number of Children: N/A  . Years of Education: N/A   Occupational History  . Not on file.   Social History Main Topics  .  Smoking status: Current Every Day Smoker -- 1.00 packs/day for 35 years    Types: Cigarettes  . Smokeless tobacco: Never Used  . Alcohol Use: No  . Drug Use: No  . Sexual Activity: Not Currently   Other Topics Concern  . Not on file   Social History Narrative   Divorced   Current Smoker  1 ppd -  20 yrs      Alcohol use-no       [employer] - laid off     Past Surgical History  Procedure Laterality Date  . Lumbar fusion      and rods  . Cesarean section  1972  . Ankle surgery Left 1995    "tendon repair" (06/24/2013)  . Infusion pump implantation  1990's    "implantablet morphine pump; took it out w/in 11 months  . Knee arthroscopy Left 1991; ~ 1993  . Elbow surgery  1990's  . Thyroidectomy  2012  . Cervical spine surgery  2012  . Foot surgery Right 2012    SPUR REMOVED  . Shoulder arthroscopy Left 09/2011  . Lumbar laminectomy/decompression microdiscectomy N/A 11/28/2012    Procedure: DECOMPRESSIVE LUMBAR LAMINECTOMY LEVEL 1;  Surgeon: Elaina Hoops, MD;  Location: [place] [facility] OR[facility];  Service: Neurosurgery;  Laterality: N/A;  DECOMPRESSIVE LUMBAR LAMINECTOMY LEVEL 1  . Back surgery      "  think today was my 8th back OR" (06/24/2013)  . Cholecystectomy N/A 04/16/2013    Procedure: LAPAROSCOPIC CHOLECYSTECTOMY ;  Surgeon: Imogene Burn. Georgette Dover, MD;  Location: [facility];  Service: General;  Laterality: N/A;  . Laparoscopic lysis of adhesions N/A 04/16/2013    Procedure: LAPAROSCOPIC LYSIS OF ADHESIONS;  Surgeon: Imogene Burn. Tsuei, MD;  Location: [facility];  Service: General;  Laterality: N/A;  . Posterior fusion lumbar spine  06/24/2013  . Abdominal hysterectomy  1988    "partial" (06/24/2013)    Family History  Problem Relation Age of Onset  . Pancreatic cancer Father     deceased age 65  . Cancer Father   . Diabetes type II Mother     deceased age 33  . Hypertension Mother   . Coronary artery disease Mother   . Diabetes Mother   . Cancer Mother     Thyroid and Skin    . Diabetes Sister   . Hypertension Sister   . Cancer Sister     Breast  . Other Neg Hx     No family history of  colon cancer  . Hypertension Brother     Allergies  Allergen Reactions  . Amitriptyline Other (See Comments)    Leg swelling  . Gabapentin     REACTION: Itching Ask patient to clarify "Neurogan" on history form dated 09/25/10.  . Triamterene Itching and Rash    Current Outpatient Prescriptions on File Prior to Visit  Medication Sig Dispense Refill  . bisacodyl (DULCOLAX) 5 MG EC tablet Take 10 mg by mouth at bedtime.   30 tablet    . carisoprodol (SOMA) 350 MG tablet Take 350 mg by mouth 3 (three) times daily as needed for muscle spasms. For muscle spasms      . Cholecalciferol (VITAMIN D3) 5000 UNITS TABS Take 5,000 Units by mouth daily.       . Cyanocobalamin (VITAMIN B-12) 2500 MCG SUBL Place 5,000 mcg under the tongue daily.       . furosemide (LASIX) 20 MG tablet Take 20 mg by mouth every morning.      Marland Kitchen glucosamine-chondroitin 500-400 MG tablet Take 2 tablets by mouth daily.      . hydrochlorothiazide (HYDRODIURIL) 25 MG tablet Take 1 tablet (25 mg total) by mouth daily.  90 tablet  1  . levothyroxine (SYNTHROID, LEVOTHROID) 88 MCG tablet Take 1 tablet (88 mcg total) by mouth daily before breakfast.  90 tablet  0  . Omega-3 Fatty Acids (FISH OIL) 1200 MG CAPS Take 2,400 mg by mouth daily.      . potassium chloride SA (K-DUR,KLOR-CON) 20 MEQ tablet Take 3 tablets (60 mEq total) by mouth daily.  270 tablet  1  . simvastatin (ZOCOR) 20 MG tablet Take 1 tablet (20 mg total) by mouth at bedtime.  90 tablet  1   No current facility-administered medications on file prior to visit.    BP 120/78  Pulse 72  Temp(Src) 98 F (36.7 C) (Oral)  Ht 5\' 2"  (1.575 m)  Wt 144 lb (65.318 kg)  BMI 26.33 kg/m2     Objective:   Physical Exam  Constitutional: She is oriented to person, place, and time. She appears well-developed and well-nourished.  Cardiovascular: Normal  rate, regular rhythm and normal heart sounds.   Pulmonary/Chest: Effort normal and breath sounds normal. She has no wheezes.  Musculoskeletal: She exhibits edema.  Neurological: She is alert and oriented to person, place, and time. No cranial nerve deficit.  Skin: Skin is warm and dry.  Psychiatric: She has a normal mood and affect. Her behavior is normal.          Assessment & Plan:

## 2013-10-16 NOTE — Assessment & Plan Note (Signed)
Blood pressure stable. Patient to continue to use furosemide for lower extremity edema. Her electrolytes and kidney function are stable. BP: 120/78 mmHg

## 2013-12-01 ENCOUNTER — Other Ambulatory Visit: Payer: Self-pay | Admitting: Internal Medicine

## 2013-12-02 NOTE — Telephone Encounter (Signed)
Pt will be out of med tomorrow

## 2013-12-05 ENCOUNTER — Emergency Department (HOSPITAL_BASED_OUTPATIENT_CLINIC_OR_DEPARTMENT_OTHER): Payer: Medicare Other

## 2013-12-05 ENCOUNTER — Encounter (HOSPITAL_BASED_OUTPATIENT_CLINIC_OR_DEPARTMENT_OTHER): Payer: Self-pay | Admitting: Emergency Medicine

## 2013-12-05 ENCOUNTER — Emergency Department (HOSPITAL_BASED_OUTPATIENT_CLINIC_OR_DEPARTMENT_OTHER)
Admission: EM | Admit: 2013-12-05 | Discharge: 2013-12-05 | Disposition: A | Discharge status: Home / Self Care | Payer: Medicare Other | Attending: Emergency Medicine | Admitting: Emergency Medicine

## 2013-12-05 DIAGNOSIS — S52599A Other fractures of lower end of unspecified radius, initial encounter for closed fracture: Secondary | ICD-10-CM | POA: Insufficient documentation

## 2013-12-05 DIAGNOSIS — Z8739 Personal history of other diseases of the musculoskeletal system and connective tissue: Secondary | ICD-10-CM | POA: Insufficient documentation

## 2013-12-05 DIAGNOSIS — IMO0002 Reserved for concepts with insufficient information to code with codable children: Secondary | ICD-10-CM | POA: Insufficient documentation

## 2013-12-05 DIAGNOSIS — R011 Cardiac murmur, unspecified: Secondary | ICD-10-CM | POA: Insufficient documentation

## 2013-12-05 DIAGNOSIS — Z79899 Other long term (current) drug therapy: Secondary | ICD-10-CM | POA: Insufficient documentation

## 2013-12-05 DIAGNOSIS — Y929 Unspecified place or not applicable: Secondary | ICD-10-CM | POA: Insufficient documentation

## 2013-12-05 DIAGNOSIS — G8929 Other chronic pain: Secondary | ICD-10-CM | POA: Insufficient documentation

## 2013-12-05 DIAGNOSIS — Z9889 Other specified postprocedural states: Secondary | ICD-10-CM | POA: Insufficient documentation

## 2013-12-05 DIAGNOSIS — K219 Gastro-esophageal reflux disease without esophagitis: Secondary | ICD-10-CM | POA: Insufficient documentation

## 2013-12-05 DIAGNOSIS — E785 Hyperlipidemia, unspecified: Secondary | ICD-10-CM | POA: Insufficient documentation

## 2013-12-05 DIAGNOSIS — R296 Repeated falls: Secondary | ICD-10-CM | POA: Insufficient documentation

## 2013-12-05 DIAGNOSIS — Y9389 Activity, other specified: Secondary | ICD-10-CM | POA: Insufficient documentation

## 2013-12-05 DIAGNOSIS — Z860100 Personal history of colon polyps, unspecified: Secondary | ICD-10-CM | POA: Insufficient documentation

## 2013-12-05 DIAGNOSIS — S52509A Unspecified fracture of the lower end of unspecified radius, initial encounter for closed fracture: Secondary | ICD-10-CM

## 2013-12-05 DIAGNOSIS — E039 Hypothyroidism, unspecified: Secondary | ICD-10-CM | POA: Insufficient documentation

## 2013-12-05 DIAGNOSIS — F17200 Nicotine dependence, unspecified, uncomplicated: Secondary | ICD-10-CM | POA: Insufficient documentation

## 2013-12-05 MED ORDER — OXYCODONE-ACETAMINOPHEN 5-325 MG PO TABS
1.0000 | ORAL_TABLET | Freq: Once | ORAL | Status: AC
Start: 2013-12-05 — End: 2013-12-05
  Administered 2013-12-05: 1 via ORAL
  Filled 2013-12-05: qty 1

## 2013-12-05 NOTE — Discharge Instructions (Signed)
Forearm Fracture The forearm is between your elbow and your wrist. It has two bones (ulna and radius). A fracture is a break in one or both of these bones. HOME CARE  Raise (elevate) your arm above the level of the heart.  Put ice on the injured area.  Put ice in a plastic bag.  Place a towel between the skin and the bag.  Leave the ice on for 15-20 minutes, 03-04 times a day.  If given a plaster or fiberglass cast:  Do not try to scratch the skin under the cast with sharp or pointed objects.  Check the skin around the cast every day. You may put lotion on any red or sore areas.  Keep the cast dry and clean.  If given a plaster splint:  Wear the splint as told.  You may loosen the elastic around the splint if the fingers become numb, tingle, or turn cold or blue.  Do not put pressure on any part of the cast or splint. It may break. Rest the cast only on a pillow the first 24 hours until it is fully hardened.  The cast or splint can be protected during bathing with a plastic bag. Do not lower the cast or splint into water.  Only take medicine as told by your doctor. GET HELP RIGHT AWAY IF:   The cast gets damaged or breaks.  You have pain or puffiness (swelling).  The skin or nails below the injury turn blue or gray, or feel cold or numb.  There is a bad smell, new stains, or fluid coming from under the cast. MAKE SURE YOU:   Understand these instructions.  Will watch your condition.  Will get help right away if you are not doing well or get worse. Document Released: 12/12/2007 Document Revised: 09/17/2011 Document Reviewed: 12/12/2007 [facility] Patient Information 2014 [place], [place].  Cast or Splint Care Casts and splints support injured limbs and keep bones from moving while they heal.  HOME CARE  Keep the cast or splint uncovered during the drying period.  A plaster cast can take 24 to 48 hours to dry.  A fiberglass cast will dry in less than 1  hour.  Do not rest the cast on anything harder than a pillow for 24 hours.  Do not put weight on your injured limb. Do not put pressure on the cast. Wait for your doctor's approval.  Keep the cast or splint dry.  Cover the cast or splint with a plastic bag during baths or wet weather.  If you have a cast over your chest and belly (trunk), take sponge baths until the cast is taken off.  If your cast gets wet, dry it with a towel or blow dryer. Use the cool setting on the blow dryer.  Keep your cast or splint clean. Wash a dirty cast with a damp cloth.  Do not put any objects under your cast or splint.  Do not scratch the skin under the cast with an object. If itching is a problem, use a blow dryer on a cool setting over the itchy area.  Do not trim or cut your cast.  Do not take out the padding from inside your cast.  Exercise your joints near the cast as told by your doctor.  Raise (elevate) your injured limb on 1 or 2 pillows for the first 1 to 3 days. GET HELP IF:  Your cast or splint cracks.  Your cast or splint is too tight or  too loose.  You itch badly under the cast.  Your cast gets wet or has a soft spot.  You have a bad smell coming from the cast.  You get an object stuck under the cast.  Your skin around the cast becomes red or sore.  You have new or more pain after the cast is put on. GET HELP RIGHT AWAY IF:  You have fluid leaking through the cast.  You cannot move your fingers or toes.  Your fingers or toes turn blue or white or are cool, painful, or puffy (swollen).  You have tingling or lose feeling (numbness) around the injured area.  You have bad pain or pressure under the cast.  You have trouble breathing or have shortness of breath.  You have chest pain. Document Released: 10/25/2010 Document Revised: 02/25/2013 Document Reviewed: 01/01/2013 [facility] Patient Information 2014 [place].

## 2013-12-05 NOTE — ED Provider Notes (Addendum)
CSN: [id number]     Arrival date & time 12/05/13  0706 History   First MD Initiated Contact with Patient 12/05/13 0715     Chief Complaint  Patient presents with  . Fall     (Consider location/radiation/quality/duration/timing/severity/associated sxs/prior Treatment) Patient is a 68 y.o. female presenting with fall. The history is provided by the patient.  Fall This is a new (has rash on the lower legs and was standing on 1 leg looking at it and lost balance and fell on the left wrist) problem. The current episode started 1 to 2 hours ago. The problem occurs constantly. The problem has not changed since onset.Associated symptoms comments: Severe pain in the left wrist with swelling.  No head injury or LOC.  Mild back pain, but has chronic back pain and recently had surgery with rods in place. The symptoms are aggravated by bending and twisting. Nothing relieves the symptoms. She has tried a cold compress for the symptoms. The treatment provided no relief.    Past Medical History  Diagnosis Date  . Chronic low back pain     followed by Dr Hardin Negus pain mgt  . Hyperlipemia   . Tobacco abuse   . Hypothyroidism   . History of cardiac arrhythmia     cardiologist- Traci Turner  . Esophageal stricture   . Thyroid nodule   . Colon polyp   . GERD (gastroesophageal reflux disease)   . Dysrhythmia 2008    IRREG     . PONV (postoperative nausea and vomiting)     Pt reports symptoms are the result of gallbladder and cholecystectomy, not anesthesia  . Heart murmur     "slight; not on RX" (06/24/2013)  . Sleep apnea 1990's    "tested; tried mask; couldn't stand it; told me as long as I slept on my side I'd be ok" (06/24/2013)  . Osteoarthritis of left knee     advanced  . Arthritis     "shoulders; wrist; probably spine" (06/24/2013)   Past Surgical History  Procedure Laterality Date  . Lumbar fusion      and rods  . Cesarean section  1972  . Ankle surgery Left 1995    "tendon repair"  (06/24/2013)  . Infusion pump implantation  1990's    "implantablet morphine pump; took it out w/in 11 months  . Knee arthroscopy Left 1991; ~ 1993  . Elbow surgery  1990's  . Thyroidectomy  2012  . Cervical spine surgery  2012  . Foot surgery Right 2012    SPUR REMOVED  . Shoulder arthroscopy Left 09/2011  . Lumbar laminectomy/decompression microdiscectomy N/A 11/28/2012    Procedure: DECOMPRESSIVE LUMBAR LAMINECTOMY LEVEL 1;  Surgeon: Elaina Hoops, MD;  Location: [facility];  Service: Neurosurgery;  Laterality: N/A;  DECOMPRESSIVE LUMBAR LAMINECTOMY LEVEL 1  . Back surgery      "think today was my 8th back OR" (06/24/2013)  . Cholecystectomy N/A 04/16/2013    Procedure: LAPAROSCOPIC CHOLECYSTECTOMY ;  Surgeon: Imogene Burn. Georgette Dover, MD;  Location: [facility];  Service: General;  Laterality: N/A;  . Laparoscopic lysis of adhesions N/A 04/16/2013    Procedure: LAPAROSCOPIC LYSIS OF ADHESIONS;  Surgeon: Imogene Burn. Tsuei, MD;  Location: [facility];  Service: General;  Laterality: N/A;  . Posterior fusion lumbar spine  06/24/2013  . Abdominal hysterectomy  1988    "partial" (06/24/2013)   Family History  Problem Relation Age of Onset  . Pancreatic cancer Father     deceased age 84  .  Cancer Father   . Diabetes type II Mother     deceased age 11  . Hypertension Mother   . Coronary artery disease Mother   . Diabetes Mother   . Cancer Mother     Thyroid and Skin  . Diabetes Sister   . Hypertension Sister   . Cancer Sister     Breast  . Other Neg Hx     No family history of  colon cancer  . Hypertension Brother    History  Substance Use Topics  . Smoking status: Current Every Day Smoker -- 1.00 packs/day for 35 years    Types: Cigarettes  . Smokeless tobacco: Never Used  . Alcohol Use: No   OB History   Grav Para Term Preterm Abortions TAB SAB Ect Mult Living                 Review of Systems  All other systems reviewed and are negative.     Allergies  Amitriptyline; Gabapentin;  and Triamterene  Home Medications   Prior to Admission medications   Medication Sig Start Date End Date Taking? Authorizing Provider  bisacodyl (DULCOLAX) 5 MG EC tablet Take 10 mg by mouth at bedtime.  03/06/13   Doe-Hyun Kyra Searles, DO  carisoprodol (SOMA) 350 MG tablet Take 350 mg by mouth 3 (three) times daily as needed for muscle spasms. For muscle spasms    Historical Provider, MD  Cholecalciferol (VITAMIN D3) 5000 UNITS TABS Take 5,000 Units by mouth daily.     Historical Provider, MD  clonazePAM (KLONOPIN) 1 MG tablet TAKE ONE TABLET BY MOUTH AT BEDTIME AS NEEDED    Doe-Hyun R Yoo, DO  Cyanocobalamin (VITAMIN B-12) 2500 MCG SUBL Place 5,000 mcg under the tongue daily.     Historical Provider, MD  estradiol (ESTRACE) 0.5 MG tablet Take 0.5 tablets (0.25 mg total) by mouth at bedtime. 10/16/13   Doe-Hyun R Shawna Orleans, DO  furosemide (LASIX) 20 MG tablet Take 1 tablet (20 mg total) by mouth every morning. 10/16/13   Doe-Hyun R Shawna Orleans, DO  glucosamine-chondroitin 500-400 MG tablet Take 2 tablets by mouth daily.    Historical Provider, MD  hydrochlorothiazide (HYDRODIURIL) 25 MG tablet Take 1 tablet (25 mg total) by mouth daily. 10/16/13   Doe-Hyun R Shawna Orleans, DO  HYDROcodone-acetaminophen (NORCO) 10-325 MG per tablet Take 1 tablet by mouth every 4 (four) hours. 10/03/13   Historical Provider, MD  levothyroxine (SYNTHROID, LEVOTHROID) 88 MCG tablet Take 1 tablet (88 mcg total) by mouth daily before breakfast. 08/26/13   Doe-Hyun R Shawna Orleans, DO  Omega-3 Fatty Acids (FISH OIL) 1200 MG CAPS Take 2,400 mg by mouth daily.    Historical Provider, MD  omeprazole (PRILOSEC) 40 MG capsule Take 1 capsule (40 mg total) by mouth daily. 10/16/13   Doe-Hyun R Shawna Orleans, DO  potassium chloride SA (K-DUR,KLOR-CON) 20 MEQ tablet Take 3 tablets (60 mEq total) by mouth daily. 08/17/13   Doe-Hyun R Shawna Orleans, DO  simvastatin (ZOCOR) 20 MG tablet Take 1 tablet (20 mg total) by mouth at bedtime. 09/17/13   Doe-Hyun R Shawna Orleans, DO   BP 123/75  Pulse 83  Temp(Src)  98.3 F (36.8 C) (Oral)  Resp 20  SpO2 99% Physical Exam  Nursing note and vitals reviewed. Constitutional: She is oriented to person, place, and time. She appears well-developed and well-nourished. No distress.  HENT:  Head: Normocephalic and atraumatic.  Mouth/Throat: Oropharynx is clear and moist.  Eyes: Conjunctivae and EOM are normal. Pupils are equal,  round, and reactive to light.  Neck: Normal range of motion. Neck supple.  Cardiovascular: Normal rate, regular rhythm and intact distal pulses.   No murmur heard. Pulmonary/Chest: Effort normal and breath sounds normal. No respiratory distress. She has no wheezes. She has no rales.  Abdominal: Soft. She exhibits no distension. There is no tenderness. There is no rebound and no guarding.  Musculoskeletal: She exhibits no edema.       Left shoulder: Normal.       Left elbow: Normal.       Left wrist: She exhibits decreased range of motion, tenderness, bony tenderness, swelling and deformity.       Lumbar back: She exhibits tenderness. She exhibits normal range of motion and no bony tenderness.       Back:       Arms: Well healed surgical scars without midline tenderness.  Mild left paralumbar tenderness  Neurological: She is alert and oriented to person, place, and time.  Skin: Skin is warm and dry. No rash noted. No erythema.  Psychiatric: She has a normal mood and affect. Her behavior is normal.    ED Course  Procedures (including critical care time) Labs Review Labs Reviewed - No data to display  Imaging Review Dg Lumbar Spine Complete  12/05/2013   CLINICAL DATA:  Status post fall  EXAM: LUMBAR SPINE - COMPLETE 4+ VIEW  COMPARISON:  09/08/2013  FINDINGS: The patient is status post fusion of T10 through S1. Interbody spacers are noted at L1-2, L2-3 and L5-S1. Solid fusion of L3-4 is identified. There is partial fusion of the L4-5 disc space. Stable appearance of lumbar scoliosis which appears convex towards the right. No  fractures are identified.  IMPRESSION: 1. No acute findings. 2. Stable appearance of the hardware from previous posterior fixation of T10 through S1.   Electronically Signed   By: Kerby Moors M.D.   On: 12/05/2013 08:00   Dg Wrist Complete Left  12/05/2013   CLINICAL DATA:  Traumatic injury and pain  EXAM: LEFT WRIST - COMPLETE 3+ VIEW  COMPARISON:  None.  FINDINGS: There is a comminuted fracture of the distal left radius with mild impaction and posterior angulation at the fracture site. The fracture extends into the radial carpal articulation. Degenerative changes at the first [facility] joint are seen. No other focal abnormality is noted.  IMPRESSION: Distal radial fracture as described.   Electronically Signed   By: Inez Catalina M.D.   On: 12/05/2013 08:03     EKG Interpretation None      MDM   Final diagnoses:  Distal radial fracture    With a mechanical fall today when she lost her balance trying to look at her lower leg catching herself on her left wrist.  She denies head injury or LOC. However she's had extensive thoracic and lumbar surgery with rod placement and is concerned that the fall may have injured this however she does not feel that her back is much more tender than normal.  Patient's left wrist has mild deformity, swelling and severe tenderness with any range of motion. Normal elbow and shoulder exam. Neurovascularly intact.  Upon inspection of the back there is a well-healed surgical scar without significant new midline tenderness per patient. Mild left-sided muscular tenderness  Plain films of the L-spine list pending. Patient given pain control.  8:22 AM Shows no change in lumbar spine but wrist imaging shows distal posterior angulated impacted radial fracture.  Splint placed and pt will f/u with  her hand surgeon Dr. Caralyn Guile by calling on Monday for f/u appt. After splint pt was N/V intact    Blanchie Dessert, MD 12/05/13 3833  Blanchie Dessert, MD 12/05/13 3832

## 2013-12-05 NOTE — ED Notes (Signed)
EMT at bedside applying splint 

## 2013-12-05 NOTE — ED Notes (Signed)
Patient reports that she fell this off of chair landing on back and left arm, fell onto hardwood floors, no loc.  Patient complains of thoracic and lumbar back pain soreness, reports that she has had recent surgery to same. Deformity noted to left wrist with deformity, positive distal pulses

## 2013-12-09 ENCOUNTER — Telehealth: Payer: Self-pay | Admitting: Internal Medicine

## 2013-12-09 MED ORDER — LEVOTHYROXINE SODIUM 88 MCG PO TABS: 88.0000 ug | ORAL_TABLET | Freq: Every day | ORAL | Status: DC

## 2013-12-09 NOTE — Telephone Encounter (Signed)
rx sent in electronically 

## 2013-12-09 NOTE — Telephone Encounter (Signed)
[facility] (MAIL ORDER) [facility] - [place], [place] is requesting re-fill on levothyroxine (SYNTHROID, LEVOTHROID) 88 MCG tablet

## 2013-12-28 ENCOUNTER — Other Ambulatory Visit: Payer: Self-pay | Admitting: Orthopedic Surgery

## 2013-12-28 ENCOUNTER — Ambulatory Visit
Admission: RE | Admit: 2013-12-28 | Discharge: 2013-12-28 | Disposition: A | Discharge status: Home / Self Care | Payer: Medicare Other | Source: Ambulatory Visit | Attending: Orthopedic Surgery | Admitting: Orthopedic Surgery

## 2013-12-28 DIAGNOSIS — S62102A Fracture of unspecified carpal bone, left wrist, initial encounter for closed fracture: Secondary | ICD-10-CM

## 2013-12-29 ENCOUNTER — Encounter (HOSPITAL_COMMUNITY): Payer: Self-pay | Admitting: Pharmacy Technician

## 2013-12-29 ENCOUNTER — Other Ambulatory Visit (HOSPITAL_COMMUNITY): Payer: Self-pay | Admitting: *Deleted

## 2013-12-29 ENCOUNTER — Encounter (HOSPITAL_COMMUNITY): Payer: Self-pay | Admitting: *Deleted

## 2013-12-30 ENCOUNTER — Encounter (HOSPITAL_COMMUNITY)
Admission: RE | Disposition: A | Discharge status: Home / Self Care | Payer: Self-pay | Source: Ambulatory Visit | Attending: Orthopedic Surgery

## 2013-12-30 ENCOUNTER — Encounter (HOSPITAL_COMMUNITY): Payer: Medicare Other | Admitting: Anesthesiology

## 2013-12-30 ENCOUNTER — Ambulatory Visit (HOSPITAL_COMMUNITY)
Admission: RE | Admit: 2013-12-30 | Discharge: 2014-01-01 | Disposition: A | Discharge status: Home / Self Care | Payer: Medicare Other | Source: Ambulatory Visit | Attending: Orthopedic Surgery | Admitting: Orthopedic Surgery

## 2013-12-30 ENCOUNTER — Ambulatory Visit (HOSPITAL_COMMUNITY): Payer: Medicare Other

## 2013-12-30 ENCOUNTER — Ambulatory Visit (HOSPITAL_COMMUNITY): Payer: Medicare Other | Admitting: Anesthesiology

## 2013-12-30 ENCOUNTER — Encounter (HOSPITAL_COMMUNITY): Payer: Self-pay | Admitting: *Deleted

## 2013-12-30 DIAGNOSIS — W19XXXA Unspecified fall, initial encounter: Secondary | ICD-10-CM | POA: Insufficient documentation

## 2013-12-30 DIAGNOSIS — I739 Peripheral vascular disease, unspecified: Secondary | ICD-10-CM | POA: Insufficient documentation

## 2013-12-30 DIAGNOSIS — S52502A Unspecified fracture of the lower end of left radius, initial encounter for closed fracture: Secondary | ICD-10-CM

## 2013-12-30 DIAGNOSIS — R011 Cardiac murmur, unspecified: Secondary | ICD-10-CM | POA: Insufficient documentation

## 2013-12-30 DIAGNOSIS — E785 Hyperlipidemia, unspecified: Secondary | ICD-10-CM | POA: Insufficient documentation

## 2013-12-30 DIAGNOSIS — E039 Hypothyroidism, unspecified: Secondary | ICD-10-CM | POA: Insufficient documentation

## 2013-12-30 DIAGNOSIS — M1710 Unilateral primary osteoarthritis, unspecified knee: Secondary | ICD-10-CM | POA: Insufficient documentation

## 2013-12-30 DIAGNOSIS — K222 Esophageal obstruction: Secondary | ICD-10-CM | POA: Insufficient documentation

## 2013-12-30 DIAGNOSIS — G4730 Sleep apnea, unspecified: Secondary | ICD-10-CM | POA: Insufficient documentation

## 2013-12-30 DIAGNOSIS — K219 Gastro-esophageal reflux disease without esophagitis: Secondary | ICD-10-CM | POA: Insufficient documentation

## 2013-12-30 DIAGNOSIS — J449 Chronic obstructive pulmonary disease, unspecified: Secondary | ICD-10-CM | POA: Insufficient documentation

## 2013-12-30 DIAGNOSIS — F17200 Nicotine dependence, unspecified, uncomplicated: Secondary | ICD-10-CM | POA: Insufficient documentation

## 2013-12-30 DIAGNOSIS — I499 Cardiac arrhythmia, unspecified: Secondary | ICD-10-CM | POA: Insufficient documentation

## 2013-12-30 DIAGNOSIS — J4489 Other specified chronic obstructive pulmonary disease: Secondary | ICD-10-CM | POA: Insufficient documentation

## 2013-12-30 DIAGNOSIS — S52599A Other fractures of lower end of unspecified radius, initial encounter for closed fracture: Secondary | ICD-10-CM | POA: Insufficient documentation

## 2013-12-30 HISTORY — PX: ORIF DISTAL RADIUS FRACTURE: SUR927

## 2013-12-30 HISTORY — PX: ORIF WRIST FRACTURE: SHX2133

## 2013-12-30 HISTORY — DX: Constipation, unspecified: K59.00

## 2013-12-30 LAB — CBC
HCT: 36.9 % (ref 36.0–46.0)
Hemoglobin: 12.3 g/dL (ref 12.0–15.0)
MCH: 30.2 pg (ref 26.0–34.0)
MCHC: 33.3 g/dL (ref 30.0–36.0)
MCV: 90.7 fL (ref 78.0–100.0)
Platelets: 141 10*3/uL — ABNORMAL LOW (ref 150–400)
RBC: 4.07 MIL/uL (ref 3.87–5.11)
RDW: 13.5 % (ref 11.5–15.5)
WBC: 4.4 10*3/uL (ref 4.0–10.5)

## 2013-12-30 LAB — BASIC METABOLIC PANEL
BUN: 9 mg/dL (ref 6–23)
CO2: 29 mEq/L (ref 19–32)
Calcium: 8.8 mg/dL (ref 8.4–10.5)
Chloride: 98 mEq/L (ref 96–112)
Creatinine, Ser: 0.66 mg/dL (ref 0.50–1.10)
GFR calc Af Amer: >90 mL/min (ref >90)
GFR calc non Af Amer: 88 mL/min — ABNORMAL LOW (ref >90)
Glucose, Bld: 99 mg/dL (ref 70–99)
Potassium: 3.8 mEq/L (ref 3.7–5.3)
Sodium: 139 mEq/L (ref 137–147)

## 2013-12-30 SURGERY — OPEN REDUCTION INTERNAL FIXATION (ORIF) WRIST FRACTURE
Anesthesia: General | Site: Wrist | Laterality: Left

## 2013-12-30 MED ORDER — METHOCARBAMOL 500 MG PO TABS
500.0000 mg | ORAL_TABLET | Freq: Four times a day (QID) | ORAL | Status: DC | PRN
  Administered 2013-12-30 – 2014-01-01 (×5): 500 mg via ORAL
  Filled 2013-12-30 (×4): qty 1

## 2013-12-30 MED ORDER — CLONAZEPAM 1 MG PO TABS
1.0000 mg | ORAL_TABLET | Freq: Every day | ORAL | Status: DC
  Administered 2013-12-31 – 2014-01-01 (×2): 1 mg via ORAL
  Filled 2013-12-30 (×4): qty 1

## 2013-12-30 MED ORDER — LIDOCAINE HCL (CARDIAC) 20 MG/ML IV SOLN
INTRAVENOUS | Status: DC | PRN
  Administered 2013-12-30: 80 mg via INTRAVENOUS

## 2013-12-30 MED ORDER — OXYCODONE-ACETAMINOPHEN 5-325 MG PO TABS
1.0000 | ORAL_TABLET | ORAL | Status: DC | PRN
  Administered 2013-12-30: 1 via ORAL

## 2013-12-30 MED ORDER — DOCUSATE SODIUM 100 MG PO CAPS
100.0000 mg | ORAL_CAPSULE | Freq: Two times a day (BID) | ORAL | Status: DC
  Administered 2013-12-30 – 2014-01-01 (×3): 100 mg via ORAL
  Filled 2013-12-30 (×5): qty 1

## 2013-12-30 MED ORDER — SIMVASTATIN 20 MG PO TABS
20.0000 mg | ORAL_TABLET | Freq: Every day | ORAL | Status: DC
  Administered 2013-12-30 – 2013-12-31 (×2): 20 mg via ORAL
  Filled 2013-12-30 (×3): qty 1

## 2013-12-30 MED ORDER — FUROSEMIDE 20 MG PO TABS
20.0000 mg | ORAL_TABLET | Freq: Every day | ORAL | Status: DC | PRN
  Filled 2013-12-30: qty 1

## 2013-12-30 MED ORDER — KCL IN DEXTROSE-NACL 20-5-0.45 MEQ/L-%-% IV SOLN
INTRAVENOUS | Status: DC
  Administered 2013-12-30 – 2013-12-31 (×2): via INTRAVENOUS
  Filled 2013-12-30 (×3): qty 1000

## 2013-12-30 MED ORDER — ONDANSETRON HCL 4 MG/2ML IJ SOLN: 4.0000 mg | Freq: Once | INTRAMUSCULAR | Status: DC | PRN

## 2013-12-30 MED ORDER — LEVOTHYROXINE SODIUM 88 MCG PO TABS
88.0000 ug | ORAL_TABLET | Freq: Every day | ORAL | Status: DC
  Administered 2013-12-31 – 2014-01-01 (×2): 88 ug via ORAL
  Filled 2013-12-30 (×3): qty 1

## 2013-12-30 MED ORDER — METHOCARBAMOL 500 MG PO TABS: 500.0000 mg | ORAL_TABLET | Freq: Four times a day (QID) | ORAL | Status: DC

## 2013-12-30 MED ORDER — DIPHENHYDRAMINE HCL 25 MG PO CAPS: 25.0000 mg | ORAL_CAPSULE | Freq: Four times a day (QID) | ORAL | Status: DC | PRN

## 2013-12-30 MED ORDER — OXYCODONE-ACETAMINOPHEN 5-325 MG PO TABS: 1.0000 | ORAL_TABLET | ORAL | Status: DC | PRN

## 2013-12-30 MED ORDER — ESTRADIOL 0.5 MG PO TABS
0.2500 mg | ORAL_TABLET | Freq: Every day | ORAL | Status: DC
  Administered 2013-12-30: 0.25 mg via ORAL
  Administered 2014-01-01: 01:00:00 via ORAL
  Filled 2013-12-30 (×5): qty 1

## 2013-12-30 MED ORDER — BISACODYL 5 MG PO TBEC: 5.0000 mg | DELAYED_RELEASE_TABLET | Freq: Every evening | ORAL | Status: DC | PRN

## 2013-12-30 MED ORDER — MIDAZOLAM HCL 2 MG/2ML IJ SOLN
INTRAMUSCULAR | Status: AC
  Filled 2013-12-30: qty 2

## 2013-12-30 MED ORDER — HYDROCHLOROTHIAZIDE 25 MG PO TABS
25.0000 mg | ORAL_TABLET | Freq: Every day | ORAL | Status: DC
  Administered 2013-12-31 – 2014-01-01 (×2): 25 mg via ORAL
  Filled 2013-12-30 (×2): qty 1

## 2013-12-30 MED ORDER — HYDROMORPHONE HCL PF 1 MG/ML IJ SOLN
0.5000 mg | INTRAMUSCULAR | Status: DC | PRN
  Administered 2013-12-31 – 2014-01-01 (×8): 1 mg via INTRAVENOUS
  Filled 2013-12-30 (×8): qty 1

## 2013-12-30 MED ORDER — FENTANYL CITRATE 0.05 MG/ML IJ SOLN
INTRAMUSCULAR | Status: AC
  Administered 2013-12-30: 100 ug
  Filled 2013-12-30: qty 2

## 2013-12-30 MED ORDER — HYDROMORPHONE HCL PF 1 MG/ML IJ SOLN: 0.2500 mg | INTRAMUSCULAR | Status: DC | PRN

## 2013-12-30 MED ORDER — ALPRAZOLAM 0.5 MG PO TABS
0.5000 mg | ORAL_TABLET | Freq: Four times a day (QID) | ORAL | Status: DC | PRN
  Administered 2013-12-31: 0.5 mg via ORAL
  Filled 2013-12-30: qty 1

## 2013-12-30 MED ORDER — 0.9 % SODIUM CHLORIDE (POUR BTL) OPTIME
TOPICAL | Status: DC | PRN
  Administered 2013-12-30: 1000 mL

## 2013-12-30 MED ORDER — VITAMIN C 500 MG PO TABS
1000.0000 mg | ORAL_TABLET | Freq: Every day | ORAL | Status: DC
  Administered 2013-12-30 – 2014-01-01 (×2): 1000 mg via ORAL
  Filled 2013-12-30 (×3): qty 2

## 2013-12-30 MED ORDER — PROPOFOL 10 MG/ML IV BOLUS
INTRAVENOUS | Status: AC
  Filled 2013-12-30: qty 20

## 2013-12-30 MED ORDER — LIDOCAINE HCL (CARDIAC) 20 MG/ML IV SOLN
INTRAVENOUS | Status: AC
  Filled 2013-12-30: qty 5

## 2013-12-30 MED ORDER — METHOCARBAMOL 500 MG PO TABS
ORAL_TABLET | ORAL | Status: AC
  Administered 2013-12-30: 20:00:00
  Filled 2013-12-30: qty 1

## 2013-12-30 MED ORDER — VITAMIN C 500 MG PO TABS: 500.0000 mg | ORAL_TABLET | Freq: Every day | ORAL | Status: DC

## 2013-12-30 MED ORDER — NICOTINE 14 MG/24HR TD PT24
14.0000 mg | MEDICATED_PATCH | TRANSDERMAL | Status: DC
  Administered 2013-12-31 – 2014-01-01 (×3): 14 mg via TRANSDERMAL
  Filled 2013-12-30 (×3): qty 1

## 2013-12-30 MED ORDER — METHOCARBAMOL 1000 MG/10ML IJ SOLN
500.0000 mg | Freq: Four times a day (QID) | INTRAVENOUS | Status: DC | PRN
  Filled 2013-12-30: qty 5

## 2013-12-30 MED ORDER — ZOLPIDEM TARTRATE 5 MG PO TABS: 5.0000 mg | ORAL_TABLET | Freq: Every evening | ORAL | Status: DC | PRN

## 2013-12-30 MED ORDER — FENTANYL CITRATE 0.05 MG/ML IJ SOLN
INTRAMUSCULAR | Status: AC
  Filled 2013-12-30: qty 5

## 2013-12-30 MED ORDER — ARTIFICIAL TEARS OP OINT
TOPICAL_OINTMENT | OPHTHALMIC | Status: AC
  Filled 2013-12-30: qty 3.5

## 2013-12-30 MED ORDER — LACTATED RINGERS IV SOLN
INTRAVENOUS | Status: DC
  Administered 2013-12-30: 50 mL/h via INTRAVENOUS

## 2013-12-30 MED ORDER — CEFAZOLIN SODIUM-DEXTROSE 2-3 GM-% IV SOLR
2.0000 g | INTRAVENOUS | Status: AC
  Administered 2013-12-30: 2 g via INTRAVENOUS

## 2013-12-30 MED ORDER — PHENYLEPHRINE HCL 10 MG/ML IJ SOLN
10.0000 mg | INTRAVENOUS | Status: DC | PRN
  Administered 2013-12-30: 10 ug/min via INTRAVENOUS

## 2013-12-30 MED ORDER — FENTANYL CITRATE 0.05 MG/ML IJ SOLN
INTRAMUSCULAR | Status: DC | PRN
  Administered 2013-12-30: 50 ug via INTRAVENOUS

## 2013-12-30 MED ORDER — ESTRADIOL 0.5 MG PO TABS: 0.2500 mg | ORAL_TABLET | Freq: Every day | ORAL | Status: DC

## 2013-12-30 MED ORDER — LACTATED RINGERS IV SOLN
INTRAVENOUS | Status: DC | PRN
  Administered 2013-12-30 (×2): via INTRAVENOUS

## 2013-12-30 MED ORDER — DOCUSATE SODIUM 100 MG PO CAPS: 100.0000 mg | ORAL_CAPSULE | Freq: Two times a day (BID) | ORAL | Status: DC

## 2013-12-30 MED ORDER — BUPIVACAINE HCL (PF) 0.25 % IJ SOLN
INTRAMUSCULAR | Status: AC
  Filled 2013-12-30: qty 30

## 2013-12-30 MED ORDER — ONDANSETRON HCL 4 MG/2ML IJ SOLN
INTRAMUSCULAR | Status: DC | PRN
  Administered 2013-12-30: 4 mg via INTRAVENOUS

## 2013-12-30 MED ORDER — OXYCODONE-ACETAMINOPHEN 5-325 MG PO TABS
ORAL_TABLET | ORAL | Status: AC
  Administered 2013-12-30: 20:00:00
  Filled 2013-12-30: qty 1

## 2013-12-30 MED ORDER — CHLORHEXIDINE GLUCONATE 4 % EX LIQD
60.0000 mL | Freq: Once | CUTANEOUS | Status: DC
  Filled 2013-12-30: qty 60

## 2013-12-30 MED ORDER — CEFAZOLIN SODIUM 1-5 GM-% IV SOLN
1.0000 g | Freq: Three times a day (TID) | INTRAVENOUS | Status: AC
  Administered 2013-12-30 – 2013-12-31 (×3): 1 g via INTRAVENOUS
  Filled 2013-12-30 (×4): qty 50

## 2013-12-30 MED ORDER — CEFAZOLIN SODIUM 1-5 GM-% IV SOLN: 1.0000 g | INTRAVENOUS | Status: DC

## 2013-12-30 MED ORDER — ADULT MULTIVITAMIN W/MINERALS CH
1.0000 | ORAL_TABLET | Freq: Every day | ORAL | Status: DC
  Administered 2013-12-30 – 2014-01-01 (×3): 1 via ORAL
  Filled 2013-12-30 (×3): qty 1

## 2013-12-30 MED ORDER — POTASSIUM CHLORIDE CRYS ER 20 MEQ PO TBCR
60.0000 meq | EXTENDED_RELEASE_TABLET | Freq: Every day | ORAL | Status: DC
  Administered 2013-12-30 – 2014-01-01 (×3): 60 meq via ORAL
  Filled 2013-12-30 (×3): qty 3

## 2013-12-30 MED ORDER — HYDROCODONE-ACETAMINOPHEN 10-325 MG PO TABS
1.0000 | ORAL_TABLET | ORAL | Status: DC
  Administered 2013-12-31 – 2014-01-01 (×6): 1 via ORAL
  Filled 2013-12-30 (×8): qty 1

## 2013-12-30 MED ORDER — CARISOPRODOL 350 MG PO TABS
350.0000 mg | ORAL_TABLET | Freq: Three times a day (TID) | ORAL | Status: DC
  Administered 2013-12-30 – 2014-01-01 (×5): 350 mg via ORAL
  Filled 2013-12-30 (×5): qty 1

## 2013-12-30 MED ORDER — ONDANSETRON HCL 4 MG/2ML IJ SOLN: 4.0000 mg | Freq: Four times a day (QID) | INTRAMUSCULAR | Status: DC | PRN

## 2013-12-30 MED ORDER — PHENYLEPHRINE 40 MCG/ML (10ML) SYRINGE FOR IV PUSH (FOR BLOOD PRESSURE SUPPORT)
PREFILLED_SYRINGE | INTRAVENOUS | Status: AC
  Filled 2013-12-30: qty 10

## 2013-12-30 MED ORDER — HYDROCODONE-ACETAMINOPHEN 7.5-325 MG PO TABS: 1.0000 | ORAL_TABLET | ORAL | Status: DC | PRN

## 2013-12-30 MED ORDER — OXYCODONE-ACETAMINOPHEN 5-325 MG PO TABS
1.0000 | ORAL_TABLET | ORAL | Status: DC | PRN
  Administered 2013-12-31: 2 via ORAL
  Administered 2013-12-31: 1 via ORAL
  Administered 2013-12-31 (×2): 2 via ORAL
  Administered 2013-12-31: 1 via ORAL
  Administered 2014-01-01: 2 via ORAL
  Filled 2013-12-30: qty 2
  Filled 2013-12-30 (×2): qty 1
  Filled 2013-12-30 (×3): qty 2
  Filled 2013-12-30: qty 1

## 2013-12-30 MED ORDER — ONDANSETRON HCL 4 MG PO TABS: 4.0000 mg | ORAL_TABLET | Freq: Four times a day (QID) | ORAL | Status: DC | PRN

## 2013-12-30 MED ORDER — PROPOFOL 10 MG/ML IV BOLUS
INTRAVENOUS | Status: DC | PRN
  Administered 2013-12-30: 200 mg via INTRAVENOUS

## 2013-12-30 MED ORDER — CEFAZOLIN SODIUM-DEXTROSE 2-3 GM-% IV SOLR
INTRAVENOUS | Status: AC
Start: 2013-12-30 — End: 2013-12-31
  Filled 2013-12-30: qty 50

## 2013-12-30 MED ORDER — PHENYLEPHRINE HCL 10 MG/ML IJ SOLN
INTRAMUSCULAR | Status: DC | PRN
  Administered 2013-12-30 (×2): 80 ug via INTRAVENOUS

## 2013-12-30 SURGICAL SUPPLY — 53 items
BANDAGE ELASTIC 3 VELCRO ST LF (GAUZE/BANDAGES/DRESSINGS) ×6 IMPLANT
BANDAGE ELASTIC 4 VELCRO ST LF (GAUZE/BANDAGES/DRESSINGS) ×3 IMPLANT
BANDAGE GAUZE ELAST BULKY 4 IN (GAUZE/BANDAGES/DRESSINGS) ×3 IMPLANT
BENDERS THREADED STERILE (INSTRUMENTS) ×3 IMPLANT
BLADE SURG ROTATE 9660 (MISCELLANEOUS) IMPLANT
BNDG ESMARK 4X9 LF (GAUZE/BANDAGES/DRESSINGS) ×3 IMPLANT
BNDG GAUZE ELAST 4 BULKY (GAUZE/BANDAGES/DRESSINGS) ×3 IMPLANT
CORDS BIPOLAR (ELECTRODE) ×3 IMPLANT
COVER SURGICAL LIGHT HANDLE (MISCELLANEOUS) ×3 IMPLANT
CUFF TOURNIQUET SINGLE 18IN (TOURNIQUET CUFF) ×3 IMPLANT
CUFF TOURNIQUET SINGLE 24IN (TOURNIQUET CUFF) IMPLANT
DRAIN TLS ROUND 10FR (DRAIN) IMPLANT
DRAPE OEC MINIVIEW 54X84 (DRAPES) ×3 IMPLANT
DRAPE SURG 17X11 SM STRL (DRAPES) ×3 IMPLANT
DRSG ADAPTIC 3X8 NADH LF (GAUZE/BANDAGES/DRESSINGS) ×3 IMPLANT
ELECT REM PT RETURN 9FT ADLT (ELECTROSURGICAL)
ELECTRODE REM PT RTRN 9FT ADLT (ELECTROSURGICAL) IMPLANT
GLOVE BIOGEL PI IND STRL 8.5 (GLOVE) ×1 IMPLANT
GLOVE BIOGEL PI INDICATOR 8.5 (GLOVE) ×2
GLOVE SURG ORTHO 8.0 STRL STRW (GLOVE) ×3 IMPLANT
GOWN STRL REUS W/ TWL LRG LVL3 (GOWN DISPOSABLE) ×3 IMPLANT
GOWN STRL REUS W/ TWL XL LVL3 (GOWN DISPOSABLE) ×1 IMPLANT
GOWN STRL REUS W/TWL LRG LVL3 (GOWN DISPOSABLE) ×6
GOWN STRL REUS W/TWL XL LVL3 (GOWN DISPOSABLE) ×2
KIT BASIN OR (CUSTOM PROCEDURE TRAY) ×3 IMPLANT
KIT ROOM TURNOVER OR (KITS) ×3 IMPLANT
MANIFOLD NEPTUNE II (INSTRUMENTS) ×3 IMPLANT
NEEDLE HYPO 25X1 1.5 SAFETY (NEEDLE) ×3 IMPLANT
NS IRRIG 1000ML POUR BTL (IV SOLUTION) ×3 IMPLANT
PACK ORTHO EXTREMITY (CUSTOM PROCEDURE TRAY) ×3 IMPLANT
PAD ARMBOARD 7.5X6 YLW CONV (MISCELLANEOUS) ×6 IMPLANT
PAD CAST 3X4 CTTN HI CHSV (CAST SUPPLIES) ×1 IMPLANT
PAD CAST 4YDX4 CTTN HI CHSV (CAST SUPPLIES) ×1 IMPLANT
PADDING CAST COTTON 3X4 STRL (CAST SUPPLIES) ×2
PADDING CAST COTTON 4X4 STRL (CAST SUPPLIES) ×2
PLATE DVR E-PAK DORSAL R STD (Orthopedic Implant) ×3 IMPLANT
PLATE DVR E-PAK RADIAL R STND (Orthopedic Implant) ×3 IMPLANT
SCREW LOCK 14X2.7X 3 LD TPR (Screw) ×2 IMPLANT
SCREW LOCKING 2.7X13MM (Screw) ×6 IMPLANT
SCREW LOCKING 2.7X14 (Screw) ×4 IMPLANT
SOAP 2 % CHG 4 OZ (WOUND CARE) ×3 IMPLANT
SPLINT FIBERGLASS 3X35 (CAST SUPPLIES) ×3 IMPLANT
SPONGE GAUZE 4X4 12PLY (GAUZE/BANDAGES/DRESSINGS) ×3 IMPLANT
SUT PROLENE 4 0 PS 2 18 (SUTURE) ×3 IMPLANT
SUT VIC AB 2-0 FS1 27 (SUTURE) ×3 IMPLANT
SUT VICRYL 4-0 PS2 18IN ABS (SUTURE) ×3 IMPLANT
SYR CONTROL 10ML LL (SYRINGE) IMPLANT
SYSTEM CHEST DRAIN TLS 7FR (DRAIN) IMPLANT
TOWEL OR 17X24 6PK STRL BLUE (TOWEL DISPOSABLE) ×3 IMPLANT
TOWEL OR 17X26 10 PK STRL BLUE (TOWEL DISPOSABLE) ×3 IMPLANT
TUBE CONNECTING 12'X1/4 (SUCTIONS) ×1
TUBE CONNECTING 12X1/4 (SUCTIONS) ×2 IMPLANT
WATER STERILE IRR 1000ML POUR (IV SOLUTION) ×3 IMPLANT

## 2013-12-30 NOTE — Brief Op Note (Signed)
12/30/2013  2:32 PM  PATIENT:  Joan [name]  68 y.o. female  PRE-OPERATIVE DIAGNOSIS:  left wrist interarticular distal radius fracture  POST-OPERATIVE DIAGNOSIS:  * No post-op diagnosis entered *  PROCEDURE:  Procedure(s): OPEN REDUCTION INTERNAL FIXATION (ORIF) LEFT WRIST FRACTURE AND REPAIR AS INDICATED (Left)  SURGEON:  Surgeon(s) and Role:    * Linna Hoff, MD - Primary  PHYSICIAN ASSISTANT:   ASSISTANTS: none   ANESTHESIA:   general  EBL:     BLOOD ADMINISTERED:none  DRAINS: none   LOCAL MEDICATIONS USED:  NONE  SPECIMEN:  No Specimen  DISPOSITION OF SPECIMEN:  N/A  COUNTS:  YES  TOURNIQUET:    DICTATION: .Other Dictation: Dictation Number [id number]  PLAN OF CARE: Admit for overnight observation  PATIENT DISPOSITION:  PACU - hemodynamically stable.   Delay start of Pharmacological VTE agent (>24hrs) due to surgical blood loss or risk of bleeding: not applicable

## 2013-12-30 NOTE — Anesthesia Postprocedure Evaluation (Signed)
Anesthesia Post Note  Patient: Joan [name]  Procedure(s) Performed: Procedure(s) (LRB): OPEN REDUCTION INTERNAL FIXATION (ORIF) LEFT WRIST FRACTURE AND REPAIR AS INDICATED (Left)  Anesthesia type: General  Patient location: PACU  Post pain: Pain level controlled and Adequate analgesia  Post assessment: Post-op Vital signs reviewed, Patient's Cardiovascular Status Stable, Respiratory Function Stable, Patent Airway and Pain level controlled  Last Vitals:  Filed Vitals:   12/30/13 2000  BP: 104/44  Pulse: 71  Temp:   Resp: 14    Post vital signs: Reviewed and stable  Level of consciousness: awake, alert  and oriented  Complications: No apparent anesthesia complications

## 2013-12-30 NOTE — Discharge Instructions (Signed)
KEEP BANDAGE CLEAN AND DRY CALL OFFICE FOR F/U APPT [phone] in 14 days DR Caralyn Guile [phone] KEEP HAND ELEVATED ABOVE HEART OK TO APPLY ICE TO OPERATIVE AREA CONTACT OFFICE IF ANY WORSENING PAIN OR CONCERNS.

## 2013-12-30 NOTE — Anesthesia Preprocedure Evaluation (Signed)
Anesthesia Evaluation  Patient identified by MRN, date of birth, ID band Patient awake    Reviewed: Allergy & Precautions, H&P , NPO status , Patient's Chart, lab work & pertinent test results  Airway       Dental   Pulmonary sleep apnea , COPDCurrent Smoker,          Cardiovascular hypertension, + Peripheral Vascular Disease     Neuro/Psych    GI/Hepatic GERD-  ,  Endo/Other  Hypothyroidism   Renal/GU      Musculoskeletal   Abdominal   Peds  Hematology   Anesthesia Other Findings   Reproductive/Obstetrics                           Anesthesia Physical Anesthesia Plan  ASA: II  Anesthesia Plan: General   Post-op Pain Management: MAC Combined w/ Regional for Post-op pain   Induction: Intravenous  Airway Management Planned: Oral ETT and LMA  Additional Equipment:   Intra-op Plan:   Post-operative Plan: Extubation in OR  Informed Consent: I have reviewed the patients History and Physical, chart, labs and discussed the procedure including the risks, benefits and alternatives for the proposed anesthesia with the patient or authorized representative who has indicated his/her understanding and acceptance.     Plan Discussed with:   Anesthesia Plan Comments:         Anesthesia Quick Evaluation

## 2013-12-30 NOTE — Transfer of Care (Signed)
Immediate Anesthesia Transfer of Care Note  Patient: Joan [name]  Procedure(s) Performed: Procedure(s): OPEN REDUCTION INTERNAL FIXATION (ORIF) LEFT WRIST FRACTURE AND REPAIR AS INDICATED (Left)  Patient Location: PACU  Anesthesia Type:General  Level of Consciousness: awake, alert  and oriented  Airway & Oxygen Therapy: Patient Spontanous Breathing and Patient connected to nasal cannula oxygen  Post-op Assessment: Report given to PACU RN and Post -op Vital signs reviewed and stable  Post vital signs: Reviewed and stable  Complications: No apparent anesthesia complications

## 2013-12-30 NOTE — Anesthesia Procedure Notes (Addendum)
Anesthesia Regional Block:  Supraclavicular block  Pre-Anesthetic Checklist: ,, timeout performed, Correct Patient, Correct Site, Correct Laterality, Correct Procedure, Correct Position, site marked, Risks and benefits discussed,  Surgical consent,  Pre-op evaluation,  At surgeon's request and post-op pain management  Laterality: Left  Prep: Maximum Sterile Barrier Precautions used, chloraprep and alcohol swabs       Needles:   Needle Type: Stimulator Needle - 40          Additional Needles:  Procedures: nerve stimulator Supraclavicular block  Nerve Stimulator or Paresthesia:  Response: 0.5 mA, 0.1 ms, 35 cm  Additional Responses:   Narrative:  Start time: 12/30/2013 4:00 PM End time: 12/30/2013 4:05 PM Injection made incrementally with aspirations every 5 mL.  Performed by: Personally  Anesthesiologist: Sharolyn Douglas MD  Additional Notes: Pt accepts procedure w/ risks. 15cc 0.5% Marcaine w/ epi w/o difficulty w/ mild discomfort. Pt tolerated well. GES   Procedure Name: LMA Insertion Date/Time: 12/30/2013 5:02 PM Performed by: Susa Loffler Pre-anesthesia Checklist: Patient identified, Patient being monitored, Emergency Drugs available, Timeout performed and Suction available Patient Re-evaluated:Patient Re-evaluated prior to inductionOxygen Delivery Method: Circle system utilized Preoxygenation: Pre-oxygenation with 100% oxygen Intubation Type: IV induction LMA: LMA inserted LMA Size: 4.0 Number of attempts: 1 Tube secured with: Tape Dental Injury: Teeth and Oropharynx as per pre-operative assessment

## 2013-12-30 NOTE — Progress Notes (Signed)
Sling ordered for patient comfort when ambulating to bathroom per patient request.

## 2013-12-30 NOTE — H&P (Signed)
Joan [name] is an 68 y.o. female.   Chief Complaint: left wrist injury HPI: Pt fell several weeks ago, sustained comminuted distal radius fracture Pt had further displacement of fracture and here for surgery to improve alignment and position of fracture No prior surgery to left wrist   Past Medical History  Diagnosis Date  . Chronic low back pain     followed by Dr Hardin Negus pain mgt  . Hyperlipemia   . Tobacco abuse   . Hypothyroidism   . History of cardiac arrhythmia     cardiologist- Traci Turner  . Esophageal stricture   . Thyroid nodule   . Colon polyp   . Dysrhythmia 2008    IRREG     . PONV (postoperative nausea and vomiting)     Pt reports symptoms are the result of gallbladder and cholecystectomy, not anesthesia  . Heart murmur     "slight; not on RX" (06/24/2013)  . Sleep apnea 1990's    "tested; tried mask; couldn't stand it; told me as long as I slept on my side I'd be ok" (06/24/2013)  . Osteoarthritis of left knee     advanced  . Arthritis     "shoulders; wrist; probably spine" (06/24/2013)  . GERD (gastroesophageal reflux disease)   . Constipation     Past Surgical History  Procedure Laterality Date  . Lumbar fusion      and rods  . Cesarean section  1972  . Ankle surgery Left 1995    "tendon repair" (06/24/2013)  . Infusion pump implantation  1990's    "implantablet morphine pump; took it out w/in 11 months  . Knee arthroscopy Left 1991; ~ 1993  . Elbow surgery  1990's  . Thyroidectomy  2012  . Cervical spine surgery  2012  . Foot surgery Right 2012    SPUR REMOVED  . Shoulder arthroscopy Left 09/2011  . Lumbar laminectomy/decompression microdiscectomy N/A 11/28/2012    Procedure: DECOMPRESSIVE LUMBAR LAMINECTOMY LEVEL 1;  Surgeon: Elaina Hoops, MD;  Location: [facility];  Service: Neurosurgery;  Laterality: N/A;  DECOMPRESSIVE LUMBAR LAMINECTOMY LEVEL 1  . Back surgery      "think today was my 8th back OR" (06/24/2013)  . Cholecystectomy N/A  04/16/2013    Procedure: LAPAROSCOPIC CHOLECYSTECTOMY ;  Surgeon: Imogene Burn. Georgette Dover, MD;  Location: [facility];  Service: General;  Laterality: N/A;  . Laparoscopic lysis of adhesions N/A 04/16/2013    Procedure: LAPAROSCOPIC LYSIS OF ADHESIONS;  Surgeon: Imogene Burn. Tsuei, MD;  Location: [facility];  Service: General;  Laterality: N/A;  . Posterior fusion lumbar spine  06/24/2013  . Abdominal hysterectomy  1988    "partial" (06/24/2013)    Family History  Problem Relation Age of Onset  . Pancreatic cancer Father     deceased age 51  . Cancer Father   . Diabetes type II Mother     deceased age 34  . Hypertension Mother   . Coronary artery disease Mother   . Diabetes Mother   . Cancer Mother     Thyroid and Skin  . Diabetes Sister   . Hypertension Sister   . Cancer Sister     Breast  . Other Neg Hx     No family history of  colon cancer  . Hypertension Brother    Social History:  reports that she has been smoking Cigarettes.  She has a 52.5 pack-year smoking history. She has never used smokeless tobacco. She reports that she does  not drink alcohol or use illicit drugs.  Allergies:  Allergies  Allergen Reactions  . Morphine And Related Swelling  . Amitriptyline Other (See Comments)    Leg swelling  . Gabapentin Itching    neurontin  . Triamterene Itching and Rash    Medications Prior to Admission  Medication Sig Dispense Refill  . bisacodyl (DULCOLAX) 5 MG EC tablet Take 5-10 mg by mouth at bedtime as needed for mild constipation (depending on amount of pain medication taken).   30 tablet    . carisoprodol (SOMA) 350 MG tablet Take 350 mg by mouth 3 (three) times daily. For muscle spasms      . Cholecalciferol (VITAMIN D3) 5000 UNITS TABS Take 5,000 Units by mouth daily.       . clonazePAM (KLONOPIN) 1 MG tablet Take 1 mg by mouth at bedtime.      . Cyanocobalamin (VITAMIN B-12) 2500 MCG SUBL Place 5,000 mcg under the tongue daily.       Marland Kitchen estradiol (ESTRACE) 0.5 MG tablet Take 0.5  tablets (0.25 mg total) by mouth at bedtime.      . furosemide (LASIX) 20 MG tablet Take 20 mg by mouth daily as needed for fluid.      Marland Kitchen glucosamine-chondroitin 500-400 MG tablet Take 2 tablets by mouth daily.      . hydrochlorothiazide (HYDRODIURIL) 25 MG tablet Take 1 tablet (25 mg total) by mouth daily.  90 tablet  1  . HYDROcodone-acetaminophen (NORCO) 10-325 MG per tablet Take 1 tablet by mouth every 4 (four) hours.      Marland Kitchen levothyroxine (SYNTHROID, LEVOTHROID) 88 MCG tablet Take 1 tablet (88 mcg total) by mouth daily before breakfast.  90 tablet  1  . Omega-3 Fatty Acids (FISH OIL) 1200 MG CAPS Take 2,400 mg by mouth daily.      Marland Kitchen omeprazole (PRILOSEC) 40 MG capsule Take 1 capsule (40 mg total) by mouth daily.  90 capsule  1  . potassium chloride SA (K-DUR,KLOR-CON) 20 MEQ tablet Take 3 tablets (60 mEq total) by mouth daily.  270 tablet  1  . simvastatin (ZOCOR) 20 MG tablet Take 1 tablet (20 mg total) by mouth at bedtime.  90 tablet  1  . triamcinolone cream (KENALOG) 0.1 % Apply 1 application topically daily.         Results for orders placed during the hospital encounter of 12/30/13 (from the past 48 hour(s))  BASIC METABOLIC PANEL     Status: Abnormal   Collection Time    12/30/13  1:07 PM      Result Value Ref Range   Sodium 139  137 - 147 mEq/L   Potassium 3.8  3.7 - 5.3 mEq/L   Chloride 98  96 - 112 mEq/L   CO2 29  19 - 32 mEq/L   Glucose, Bld 99  70 - 99 mg/dL   BUN 9  6 - 23 mg/dL   Creatinine, Ser 0.66  0.50 - 1.10 mg/dL   Calcium 8.8  8.4 - 10.5 mg/dL   GFR calc non Af Amer 88 (*) >90 mL/min   GFR calc Af Amer >90  >90 mL/min   Comment: (NOTE)     The eGFR has been calculated using the CKD EPI equation.     This calculation has not been validated in all clinical situations.     eGFR's persistently <90 mL/min signify possible Chronic Kidney     Disease.  CBC     Status: Abnormal  Collection Time    12/30/13  1:07 PM      Result Value Ref Range   WBC 4.4  4.0 -  10.5 K/uL   RBC 4.07  3.87 - 5.11 MIL/uL   Hemoglobin 12.3  12.0 - 15.0 g/dL   HCT 36.9  36.0 - 46.0 %   MCV 90.7  78.0 - 100.0 fL   MCH 30.2  26.0 - 34.0 pg   MCHC 33.3  30.0 - 36.0 g/dL   RDW 13.5  11.5 - 15.5 %   Platelets 141 (*) 150 - 400 K/uL   Dg Chest 2 View  12/30/2013   CLINICAL DATA:  ORIF of left wrist fracture  EXAM: CHEST  2 VIEW  COMPARISON:  Chest x-ray of 11/21/2012  FINDINGS: No active infiltrate or effusion is seen. Mediastinal and hilar contours are unchanged. The heart is within upper limits of normal. There are degenerative changes in the mid to lower thoracic spine. An anterior cervical fusion plate is present as well as hardware for fusion of the lower thoracic and upper lumbar spine.  IMPRESSION: Stable chest x-ray.  No active lung disease.   Electronically Signed   By: Ivar Drape M.D.   On: 12/30/2013 13:23   Ct Wrist Left Wo Contrast  12/28/2013   CLINICAL DATA:  Nonspecific (abnormal) findings on radiological and other examination of musculoskeletal system. Comminuted distal left radius fracture 3 weeks ago. Persisting pain.  EXAM: CT SCAN OF THE LEFT WRIST WITHOUT CONTRAST AND 3-DIMENSIONAL CT IMAGE RENDERING ON INDEPENDENT WORKSTATION  TECHNIQUE: Axial images were performed with multiplanar reformatted images and 3-dimensional CT images were rendered by post-processing of the original CT data on an independent workstation. The 3-dimensional CT images were interpreted and findings were reported in the accompanying complete CT report for this study  COMPARISON:  Radiographs dated 12/05/2013  FINDINGS: There is a comminuted fracture of the distal radius. There is slight dorsal impaction of the fragments as well as a focal 1.3 mm impaction of radial styloid component in of the fracture. There small fracture fragments adjacent to the scapholunate ligament in the radiocarpal joint. There is no fracture of the carpal bones or the bases of the metacarpals. There is fairly severe  arthritis of the first carpal metacarpal joint with ossified loose bodies in the joint. There is less severe arthritis between the scaphoid and trapezium and trapezoid.  No widening of the scapholunate or lunotriquetral joint spaces. The fracture does extend into the distal radial ulnar joint with slight step-off of the articular surface of the radius at that point.  There are no visible and trapped tendons are muscles.  IMPRESSION: Comminuted fracture of the distal radius as described. Minimal impaction of several fracture fragments as described.   Electronically Signed   By: Rozetta Nunnery M.D.   On: 12/28/2013 18:46   Ct 3d Independent Darreld Mclean  12/28/2013   CLINICAL DATA:  Nonspecific (abnormal) findings on radiological and other examination of musculoskeletal system. Comminuted distal left radius fracture 3 weeks ago. Persisting pain.  EXAM: CT SCAN OF THE LEFT WRIST WITHOUT CONTRAST AND 3-DIMENSIONAL CT IMAGE RENDERING ON INDEPENDENT WORKSTATION  TECHNIQUE: Axial images were performed with multiplanar reformatted images and 3-dimensional CT images were rendered by post-processing of the original CT data on an independent workstation. The 3-dimensional CT images were interpreted and findings were reported in the accompanying complete CT report for this study  COMPARISON:  Radiographs dated 12/05/2013  FINDINGS: There is a comminuted fracture of the  distal radius. There is slight dorsal impaction of the fragments as well as a focal 1.3 mm impaction of radial styloid component in of the fracture. There small fracture fragments adjacent to the scapholunate ligament in the radiocarpal joint. There is no fracture of the carpal bones or the bases of the metacarpals. There is fairly severe arthritis of the first carpal metacarpal joint with ossified loose bodies in the joint. There is less severe arthritis between the scaphoid and trapezium and trapezoid.  No widening of the scapholunate or lunotriquetral joint spaces.  The fracture does extend into the distal radial ulnar joint with slight step-off of the articular surface of the radius at that point.  There are no visible and trapped tendons are muscles.  IMPRESSION: Comminuted fracture of the distal radius as described. Minimal impaction of several fracture fragments as described.   Electronically Signed   By: Rozetta Nunnery M.D.   On: 12/28/2013 18:46    ROS NO RECENT ILLNESSES OR HOSPITALIZATIONS  Blood pressure 128/58, pulse 83, temperature 98.4 F (36.9 C), temperature source Oral, resp. rate 20, height 5' 1"  (1.549 m), weight 63.504 kg (140 lb), SpO2 100.00%. Physical Exam  General Appearance:  Alert, cooperative, no distress, appears stated age  Head:  Normocephalic, without obvious abnormality, atraumatic  Eyes:  Pupils equal, conjunctiva/corneas clear,         Throat: Lips, mucosa, and tongue normal; teeth and gums normal  Neck: No visible masses     Lungs:   respirations unlabored  Chest Wall:  No tenderness or deformity  Heart:  Regular rate and rhythm,  Abdomen:   Soft, non-tender,         Extremities: LUE: CAST IN PLACE FINGERS WARM WELL PERFUSED GOOD THUMB AND DIGITAL MOBILITY LACKS FULL FOREARM ROTATION  Pulses: 2+ and symmetric  Skin: Skin color, texture, turgor normal, no rashes or lesions     Neurologic: Normal    Assessment/Plan LEFT WRIST COMMINUTED DISTAL RADIUS FRACTURE, INTRAARTICULAR OF 3 OR MORE FRAGMENTS  LEFT WRIST OPEN REDUCTION AND INTERNAL FIXATION, REPAIR AS INDICATED  R/B/A DISCUSSED WITH PT IN OFFICE.  PT VOICED UNDERSTANDING OF PLAN CONSENT SIGNED DAY OF SURGERY PT SEEN AND EXAMINED PRIOR TO OPERATIVE PROCEDURE/DAY OF SURGERY SITE MARKED. QUESTIONS ANSWERED WILL REMAIN OVERNIGHT OBSERVATION  FOLLOWING SURGERY  Linna Hoff 12/30/2013, 1631 PM

## 2013-12-30 NOTE — Progress Notes (Signed)
Orthopedic Tech Progress Note Patient Details:  Joan [name] [birth year] [id number]  Ortho Devices Type of Ortho Device: Arm sling Ortho Device/Splint Location: LUE Ortho Device/Splint Interventions: Ordered;Application   Braulio Bosch 12/30/2013, 10:31 PM

## 2013-12-31 ENCOUNTER — Encounter (HOSPITAL_COMMUNITY): Payer: Self-pay | Admitting: General Practice

## 2013-12-31 MED ORDER — OXYCODONE HCL 5 MG PO TABS
10.0000 mg | ORAL_TABLET | Freq: Four times a day (QID) | ORAL | Status: DC | PRN
  Administered 2013-12-31 – 2014-01-01 (×2): 10 mg via ORAL
  Filled 2013-12-31 (×2): qty 2

## 2013-12-31 NOTE — Discharge Summary (Signed)
Physician Discharge Summary  Patient ID: Joan [name] MRN: [id number] DOB/AGE: 1945-11-18 68 y.o.  Admit date: 12/30/2013 Discharge date:   Admission Diagnoses: left wrist interarticular distal radius fracture Past Medical History  Diagnosis Date  . Chronic low back pain     followed by Dr Hardin Negus pain mgt  . Hyperlipemia   . Tobacco abuse   . Hypothyroidism   . History of cardiac arrhythmia     cardiologist- Traci Turner  . Esophageal stricture   . Thyroid nodule   . Colon polyp   . Dysrhythmia 2008    IRREG     . PONV (postoperative nausea and vomiting)     Pt reports symptoms are the result of gallbladder and cholecystectomy, not anesthesia  . Heart murmur     "slight; not on RX" (06/24/2013)  . Sleep apnea 1990's    "tested; tried mask; couldn't stand it; told me as long as I slept on my side I'd be ok" (06/24/2013)  . Osteoarthritis of left knee     advanced  . Arthritis     "shoulders; wrist; probably spine" (06/24/2013)  . GERD (gastroesophageal reflux disease)   . Constipation     Discharge Diagnoses:  Active Problems:   Fracture of left distal radius   Surgeries: Procedure(s): OPEN REDUCTION INTERNAL FIXATION (ORIF) LEFT WRIST FRACTURE AND REPAIR AS INDICATED on 12/30/2013    Consultants:    Discharged Condition: Improved  Hospital Course: SHOSHANAH [name] is an 68 y.o. female who was admitted 12/30/2013 with a chief complaint of No chief complaint on file. , and found to have a diagnosis of left wrist interarticular distal radius fracture.  They were brought to the operating room on 12/30/2013 and underwent Procedure(s): OPEN REDUCTION INTERNAL FIXATION (ORIF) LEFT WRIST FRACTURE AND REPAIR AS INDICATED.    They were given perioperative antibiotics: Anti-infectives   Start     Dose/Rate Route Frequency Ordered Stop   12/30/13 2330  ceFAZolin (ANCEF) IVPB 1 g/50 mL premix     1 g 100 mL/hr over 30 Minutes Intravenous Every 8 hours 12/30/13 2029  12/31/13 1534   12/30/13 2030  ceFAZolin (ANCEF) IVPB 1 g/50 mL premix  Status:  Discontinued     1 g 100 mL/hr over 30 Minutes Intravenous NOW 12/30/13 2029 12/30/13 2048   12/30/13 1315  ceFAZolin (ANCEF) IVPB 2 g/50 mL premix     2 g 100 mL/hr over 30 Minutes Intravenous On call to O.R. 12/30/13 1249 12/30/13 1700   12/30/13 1302  ceFAZolin (ANCEF) 2-3 GM-% IVPB SOLR    Comments:  Leandrew Koyanagi   : cabinet override      12/30/13 1302 12/31/13 0114    .  They were given sequential compression devices, early ambulation, and AMBULATION for DVT prophylaxis.  Recent vital signs: Patient Vitals for the past 24 hrs:  BP Temp Pulse Resp SpO2  12/31/13 0428 97/45 mmHg 98.1 F (36.7 C) 78 16 98 %  12/31/13 0124 102/52 mmHg 97.5 F (36.4 C) 73 16 99 %  12/30/13 2110 92/52 mmHg 97.6 F (36.4 C) 74 16 99 %  12/30/13 2000 104/44 mmHg - 71 14 100 %  12/30/13 1958 95/57 mmHg - 66 10 99 %  12/30/13 1945 120/65 mmHg - 81 14 99 %  12/30/13 1927 116/65 mmHg - 92 21 98 %  12/30/13 1914 131/88 mmHg 97.8 F (36.6 C) 95 11 99 %  .  Recent laboratory studies: Dg Chest 2 View  12/30/2013  CLINICAL DATA:  ORIF of left wrist fracture  EXAM: CHEST  2 VIEW  COMPARISON:  Chest x-ray of 11/21/2012  FINDINGS: No active infiltrate or effusion is seen. Mediastinal and hilar contours are unchanged. The heart is within upper limits of normal. There are degenerative changes in the mid to lower thoracic spine. An anterior cervical fusion plate is present as well as hardware for fusion of the lower thoracic and upper lumbar spine.  IMPRESSION: Stable chest x-ray.  No active lung disease.   Electronically Signed   By: Ivar Drape M.D.   On: 12/30/2013 13:23    Discharge Medications:     Medication List         bisacodyl 5 MG EC tablet  Commonly known as:  DULCOLAX  Take 5-10 mg by mouth at bedtime as needed for mild constipation (depending on amount of pain medication taken).     carisoprodol 350 MG tablet   Commonly known as:  SOMA  Take 350 mg by mouth 3 (three) times daily. For muscle spasms     clonazePAM 1 MG tablet  Commonly known as:  KLONOPIN  Take 1 mg by mouth at bedtime.     docusate sodium 100 MG capsule  Commonly known as:  COLACE  Take 1 capsule (100 mg total) by mouth 2 (two) times daily.     estradiol 0.5 MG tablet  Commonly known as:  ESTRACE  Take 0.5 tablets (0.25 mg total) by mouth at bedtime.     Fish Oil 1200 MG Caps  Take 2,400 mg by mouth daily.     furosemide 20 MG tablet  Commonly known as:  LASIX  Take 20 mg by mouth daily as needed for fluid.     glucosamine-chondroitin 500-400 MG tablet  Take 2 tablets by mouth daily.     hydrochlorothiazide 25 MG tablet  Commonly known as:  HYDRODIURIL  Take 1 tablet (25 mg total) by mouth daily.     HYDROcodone-acetaminophen 10-325 MG per tablet  Commonly known as:  NORCO  Take 1 tablet by mouth every 4 (four) hours.     levothyroxine 88 MCG tablet  Commonly known as:  SYNTHROID, LEVOTHROID  Take 1 tablet (88 mcg total) by mouth daily before breakfast.     methocarbamol 500 MG tablet  Commonly known as:  ROBAXIN  Take 1 tablet (500 mg total) by mouth 4 (four) times daily.     omeprazole 40 MG capsule  Commonly known as:  PRILOSEC  Take 1 capsule (40 mg total) by mouth daily.     oxyCODONE-acetaminophen 5-325 MG per tablet  Commonly known as:  ROXICET  Take 1 tablet by mouth every 4 (four) hours as needed for severe pain.     potassium chloride SA 20 MEQ tablet  Commonly known as:  K-DUR,KLOR-CON  Take 3 tablets (60 mEq total) by mouth daily.     simvastatin 20 MG tablet  Commonly known as:  ZOCOR  Take 1 tablet (20 mg total) by mouth at bedtime.     triamcinolone cream 0.1 %  Commonly known as:  KENALOG  Apply 1 application topically daily.     Vitamin B-12 2500 MCG Subl  Place 5,000 mcg under the tongue daily.     vitamin C 500 MG tablet  Commonly known as:  ASCORBIC ACID  Take 1 tablet  (500 mg total) by mouth daily.     Vitamin D3 5000 UNITS Tabs  Take 5,000 Units by mouth daily.  Diagnostic Studies: Dg Chest 2 View  12/30/2013   CLINICAL DATA:  ORIF of left wrist fracture  EXAM: CHEST  2 VIEW  COMPARISON:  Chest x-ray of 11/21/2012  FINDINGS: No active infiltrate or effusion is seen. Mediastinal and hilar contours are unchanged. The heart is within upper limits of normal. There are degenerative changes in the mid to lower thoracic spine. An anterior cervical fusion plate is present as well as hardware for fusion of the lower thoracic and upper lumbar spine.  IMPRESSION: Stable chest x-ray.  No active lung disease.   Electronically Signed   By: Ivar Drape M.D.   On: 12/30/2013 13:23   Dg Lumbar Spine Complete  12/05/2013   CLINICAL DATA:  Status post fall  EXAM: LUMBAR SPINE - COMPLETE 4+ VIEW  COMPARISON:  09/08/2013  FINDINGS: The patient is status post fusion of T10 through S1. Interbody spacers are noted at L1-2, L2-3 and L5-S1. Solid fusion of L3-4 is identified. There is partial fusion of the L4-5 disc space. Stable appearance of lumbar scoliosis which appears convex towards the right. No fractures are identified.  IMPRESSION: 1. No acute findings. 2. Stable appearance of the hardware from previous posterior fixation of T10 through S1.   Electronically Signed   By: Kerby Moors M.D.   On: 12/05/2013 08:00   Dg Wrist Complete Left  12/05/2013   CLINICAL DATA:  Traumatic injury and pain  EXAM: LEFT WRIST - COMPLETE 3+ VIEW  COMPARISON:  None.  FINDINGS: There is a comminuted fracture of the distal left radius with mild impaction and posterior angulation at the fracture site. The fracture extends into the radial carpal articulation. Degenerative changes at the first [facility] joint are seen. No other focal abnormality is noted.  IMPRESSION: Distal radial fracture as described.   Electronically Signed   By: Inez Catalina M.D.   On: 12/05/2013 08:03   Ct Wrist Left Wo  Contrast  12/28/2013   CLINICAL DATA:  Nonspecific (abnormal) findings on radiological and other examination of musculoskeletal system. Comminuted distal left radius fracture 3 weeks ago. Persisting pain.  EXAM: CT SCAN OF THE LEFT WRIST WITHOUT CONTRAST AND 3-DIMENSIONAL CT IMAGE RENDERING ON INDEPENDENT WORKSTATION  TECHNIQUE: Axial images were performed with multiplanar reformatted images and 3-dimensional CT images were rendered by post-processing of the original CT data on an independent workstation. The 3-dimensional CT images were interpreted and findings were reported in the accompanying complete CT report for this study  COMPARISON:  Radiographs dated 12/05/2013  FINDINGS: There is a comminuted fracture of the distal radius. There is slight dorsal impaction of the fragments as well as a focal 1.3 mm impaction of radial styloid component in of the fracture. There small fracture fragments adjacent to the scapholunate ligament in the radiocarpal joint. There is no fracture of the carpal bones or the bases of the metacarpals. There is fairly severe arthritis of the first carpal metacarpal joint with ossified loose bodies in the joint. There is less severe arthritis between the scaphoid and trapezium and trapezoid.  No widening of the scapholunate or lunotriquetral joint spaces. The fracture does extend into the distal radial ulnar joint with slight step-off of the articular surface of the radius at that point.  There are no visible and trapped tendons are muscles.  IMPRESSION: Comminuted fracture of the distal radius as described. Minimal impaction of several fracture fragments as described.   Electronically Signed   By: Rozetta Nunnery M.D.   On: 12/28/2013 18:46  Ct 3d Independent Wkst  12/28/2013   CLINICAL DATA:  Nonspecific (abnormal) findings on radiological and other examination of musculoskeletal system. Comminuted distal left radius fracture 3 weeks ago. Persisting pain.  EXAM: CT SCAN OF THE LEFT  WRIST WITHOUT CONTRAST AND 3-DIMENSIONAL CT IMAGE RENDERING ON INDEPENDENT WORKSTATION  TECHNIQUE: Axial images were performed with multiplanar reformatted images and 3-dimensional CT images were rendered by post-processing of the original CT data on an independent workstation. The 3-dimensional CT images were interpreted and findings were reported in the accompanying complete CT report for this study  COMPARISON:  Radiographs dated 12/05/2013  FINDINGS: There is a comminuted fracture of the distal radius. There is slight dorsal impaction of the fragments as well as a focal 1.3 mm impaction of radial styloid component in of the fracture. There small fracture fragments adjacent to the scapholunate ligament in the radiocarpal joint. There is no fracture of the carpal bones or the bases of the metacarpals. There is fairly severe arthritis of the first carpal metacarpal joint with ossified loose bodies in the joint. There is less severe arthritis between the scaphoid and trapezium and trapezoid.  No widening of the scapholunate or lunotriquetral joint spaces. The fracture does extend into the distal radial ulnar joint with slight step-off of the articular surface of the radius at that point.  There are no visible and trapped tendons are muscles.  IMPRESSION: Comminuted fracture of the distal radius as described. Minimal impaction of several fracture fragments as described.   Electronically Signed   By: Rozetta Nunnery M.D.   On: 12/28/2013 18:46    They benefited maximally from their hospital stay and there were no complications.     Disposition: 01-Home or Self Care      Follow-up Information   Follow up with Linna Hoff, MD In 2 weeks.   Specialty:  Orthopedic Surgery   Contact information:   [address] [place] [address] [phone]        Signed: Linna Hoff 01/01/2014 at 1754

## 2013-12-31 NOTE — Op Note (Signed)
NAMEDASHANNA, KINNAMON NO.:  [id number]  MEDICAL RECORD NO.:  [id number]  LOCATION:                                 FACILITY:  PHYSICIAN:  Melrose Nakayama, MD  DATE OF BIRTH:  [birth year]  DATE OF PROCEDURE:  12/30/2013 DATE OF DISCHARGE:  12/30/2013                              OPERATIVE REPORT   PREOPERATIVE DIAGNOSIS:  Left wrist intra-articular distal radius fracture, 3 or more fragments.  POSTOPERATIVE DIAGNOSIS:  Left wrist intra-articular distal radius fracture, 3 or more fragments..  ATTENDING PHYSICIAN:  Melrose Nakayama, MD who scrubbed and present for the entire procedure.  ASSISTANT SURGEON:  None.  ANESTHESIA:  General via LMA with Supraclavicular block.  SURGICAL IMPLANTS:  Biomet dorsal and distal radius plate and radial styloid plate, ePAK.  RADIOGRAPHS:  Three views of the wrist AP, lateral, and oblique films interpreted by me do show the internal fixation in place.  There was good restoration of the radial column, ulnar column, and radial inclination tilt and height.  SURGICAL INDICATIONS:  Ms. __________ is a right-hand dominant female, who sustained a closed injury to the left distal radius approximately 3 weeks ago.  The patient had a worsening collapse and displacement.  It was recommended based on the degree of her injury __________ to undergo the above procedure.  Risks, benefits, and alternatives were discussed in detail with the patient.  Signed informed consent was obtained. Risks include, but not limited to bleeding, infection, damage to nearby nerves, arteries, or tendons, loss of motion of the upper wrist and digits, and incomplete relief of symptoms, and need for further surgical intervention.  DESCRIPTION OF PROCEDURE:  The patient was properly identified in the preop holding area, marked with a permanent marker made on the left wrist to indicate correct operative site.  The patient was then brought back to the  operating room, placed supine on the anesthesia room table. General anesthesia was administered.  The patient tolerated this well. A well-padded tourniquet was then placed on left brachium and sealed with 1000 drape.  Left upper extremity was then prepped and draped in normal sterile fashion.  Time-out was called, correct side was identified, and procedure then begun.  Attention turned left wrist where the limb was then elevated and tourniquet insufflated.  Upon seeing, the patient received preoperative antibiotics prior to skin incision.  Left longitudinal incision made directly over the distal radius.  Dissection was carried down through skin and subcutaneous tissue.  The EPL tendon was then carefully identified and transferred dorsally.  Going through the floor of the fourth dorsal compartment, dissection was then carried down to the bone to expose the fracture site.  The posterior interosseous nerve was then essentially trapped within the fracture site.  Posterior abscess neurectomy was then carried out.  The fracture site was then opened up and an open reduction was then performed. Following this, the dorsal plate was then applied along the ulnar column, it was then held temporarily in place with K-wires and position confirmed using mini C-arm.  Following this, the proximal and distal fixation was carried out with a combination of locking and nonlocking screws, making sure not to  seed __________ the screws beneath the articular surface distally.  Once this was carried out, the radial column was then reduced.  The radial styloid plate was then applied __________, along the dorsal radial column.  Similar technique was then used, the plate was then gently contoured and the screws fixation was then carried out.  Two screws distally and 3 screws proximally locking and nonlocking screws.  The appropriate drilling and drill bit and depth gauge measurement.  The wound was then thoroughly  irrigated.  This was an intra-articular fracture 3 or more fragments.  In order to reduce the radial column, the brachioradialis was carefully released off the radial styloid.  Brachioradialis tenotomy was then carried out in order to aid reduction of the radial column.  This was a separate procedure, exposing the radial column.  Careful reduction __________ of her second and first dorsal compartment tendons were done throughout.  The wound was then irrigated.  Copious wound irrigation done throughout.  The EPL had been transferred dorsally.  The final radiographs have been obtained and the retinaculum was then closed with 2-0 Vicryl.  Subcutaneous tissues closed with 4-0 Vicryl and skin closed with 4-0 Prolene simple sutures. Adaptic dressing, sterile compressive bandage then applied.  The patient then placed in a well-padded sugar-tong splint, extubated, and taken to recovery room in good condition.  SURGICAL PROCEDURE: 1. Left wrist open reduction internal fixation of displaced intra-     articular distal radius fracture, 3 or more fragments. 2. Left wrist brachioradialis tendon release on tendon tenotomy. 3. Left wrist posterior interosseous nerve neurectomy. 4. Left wrist third dorsal compartment tendon sheath and the EPL     tendon transfer to the dorsum of the hand. 5. __________ radiographs 3 views, left wrist.  POSTPROCEDURE PLAN:  The patient was admitted overnight for IV antibiotics and pain control.  Discharge in the morning, seen back in the office approximately 2 weeks for wound check, suture removal, x- rays, application of a short-arm cast total of 4 weeks of immobilization and begin a therapy regimen.  Radiographs at each visit.     Melrose Nakayama, MD     FWO/MEDQ  D:  12/30/2013  T:  12/31/2013  Job:  [id number]

## 2013-12-31 NOTE — Progress Notes (Signed)
PT SEEN/EXAMINED SPLINT TAKEN DOWN AND LOOSENED PT TOLERATED PT ON IV PAIN MEDS, NOT READY TO GO HOME WILL CONTINUE WITH INPATIENT CARE. PT VOICED UNDERSTANDING WILL HOPE TO D/C IN AM

## 2014-01-04 ENCOUNTER — Other Ambulatory Visit: Payer: Medicare Other

## 2014-01-11 ENCOUNTER — Ambulatory Visit: Payer: Medicare Other | Admitting: Internal Medicine

## 2014-02-03 ENCOUNTER — Ambulatory Visit: Payer: Medicare Other | Attending: Orthopedic Surgery | Admitting: Physical Therapy

## 2014-02-03 DIAGNOSIS — M6281 Muscle weakness (generalized): Secondary | ICD-10-CM | POA: Insufficient documentation

## 2014-02-03 DIAGNOSIS — Z9889 Other specified postprocedural states: Secondary | ICD-10-CM | POA: Diagnosis not present

## 2014-02-03 DIAGNOSIS — Z981 Arthrodesis status: Secondary | ICD-10-CM | POA: Diagnosis not present

## 2014-02-03 DIAGNOSIS — M2560 Stiffness of unspecified joint, not elsewhere classified: Secondary | ICD-10-CM | POA: Diagnosis not present

## 2014-02-03 DIAGNOSIS — S98139A Complete traumatic amputation of one unspecified lesser toe, initial encounter: Secondary | ICD-10-CM | POA: Insufficient documentation

## 2014-02-03 DIAGNOSIS — M25539 Pain in unspecified wrist: Secondary | ICD-10-CM | POA: Diagnosis not present

## 2014-02-03 DIAGNOSIS — M25549 Pain in joints of unspecified hand: Secondary | ICD-10-CM | POA: Insufficient documentation

## 2014-02-03 DIAGNOSIS — IMO0001 Reserved for inherently not codable concepts without codable children: Secondary | ICD-10-CM | POA: Diagnosis not present

## 2014-02-09 ENCOUNTER — Ambulatory Visit: Payer: Medicare Other | Attending: Orthopedic Surgery | Admitting: Physical Therapy

## 2014-02-09 DIAGNOSIS — M2560 Stiffness of unspecified joint, not elsewhere classified: Secondary | ICD-10-CM | POA: Diagnosis not present

## 2014-02-09 DIAGNOSIS — M25549 Pain in joints of unspecified hand: Secondary | ICD-10-CM | POA: Diagnosis not present

## 2014-02-09 DIAGNOSIS — IMO0001 Reserved for inherently not codable concepts without codable children: Secondary | ICD-10-CM | POA: Insufficient documentation

## 2014-02-09 DIAGNOSIS — M6281 Muscle weakness (generalized): Secondary | ICD-10-CM | POA: Diagnosis not present

## 2014-02-09 DIAGNOSIS — M25539 Pain in unspecified wrist: Secondary | ICD-10-CM | POA: Diagnosis not present

## 2014-02-09 DIAGNOSIS — Z9889 Other specified postprocedural states: Secondary | ICD-10-CM | POA: Insufficient documentation

## 2014-02-09 DIAGNOSIS — Z981 Arthrodesis status: Secondary | ICD-10-CM | POA: Insufficient documentation

## 2014-02-09 DIAGNOSIS — S98139A Complete traumatic amputation of one unspecified lesser toe, initial encounter: Secondary | ICD-10-CM | POA: Insufficient documentation

## 2014-02-10 ENCOUNTER — Other Ambulatory Visit: Payer: Medicare Other

## 2014-02-11 ENCOUNTER — Ambulatory Visit: Payer: Medicare Other | Admitting: Physical Therapy

## 2014-02-11 DIAGNOSIS — IMO0001 Reserved for inherently not codable concepts without codable children: Secondary | ICD-10-CM | POA: Diagnosis not present

## 2014-02-16 ENCOUNTER — Ambulatory Visit: Payer: Medicare Other | Admitting: Rehabilitation

## 2014-02-16 DIAGNOSIS — IMO0001 Reserved for inherently not codable concepts without codable children: Secondary | ICD-10-CM | POA: Diagnosis not present

## 2014-02-17 ENCOUNTER — Ambulatory Visit: Payer: Medicare Other | Admitting: Internal Medicine

## 2014-02-18 ENCOUNTER — Ambulatory Visit: Payer: Medicare Other | Admitting: Physical Therapy

## 2014-02-18 DIAGNOSIS — IMO0001 Reserved for inherently not codable concepts without codable children: Secondary | ICD-10-CM | POA: Diagnosis not present

## 2014-02-22 ENCOUNTER — Ambulatory Visit: Payer: Medicare Other | Admitting: Physical Therapy

## 2014-02-22 DIAGNOSIS — IMO0001 Reserved for inherently not codable concepts without codable children: Secondary | ICD-10-CM | POA: Diagnosis not present

## 2014-02-24 ENCOUNTER — Ambulatory Visit: Payer: Medicare Other | Admitting: Physical Therapy

## 2014-02-24 DIAGNOSIS — IMO0001 Reserved for inherently not codable concepts without codable children: Secondary | ICD-10-CM | POA: Diagnosis not present

## 2014-03-01 ENCOUNTER — Other Ambulatory Visit (INDEPENDENT_AMBULATORY_CARE_PROVIDER_SITE_OTHER): Payer: Medicare Other

## 2014-03-01 DIAGNOSIS — I10 Essential (primary) hypertension: Secondary | ICD-10-CM

## 2014-03-01 DIAGNOSIS — E039 Hypothyroidism, unspecified: Secondary | ICD-10-CM

## 2014-03-01 DIAGNOSIS — E785 Hyperlipidemia, unspecified: Secondary | ICD-10-CM

## 2014-03-01 LAB — LIPID PANEL
Cholesterol: 203 mg/dL — ABNORMAL HIGH (ref 0–200)
HDL: 63.5 mg/dL (ref >39.00)
LDL Cholesterol: 111 mg/dL — ABNORMAL HIGH (ref 0–99)
NonHDL: 139.5
Total CHOL/HDL Ratio: 3
Triglycerides: 144 mg/dL (ref 0.0–149.0)
VLDL: 28.8 mg/dL (ref 0.0–40.0)

## 2014-03-01 LAB — BASIC METABOLIC PANEL
BUN: 25 mg/dL — ABNORMAL HIGH (ref 6–23)
CO2: 27 mEq/L (ref 19–32)
Calcium: 8.6 mg/dL (ref 8.4–10.5)
Chloride: 103 mEq/L (ref 96–112)
Creatinine, Ser: 0.8 mg/dL (ref 0.4–1.2)
GFR: 75.52 mL/min (ref >60.00)
Glucose, Bld: 90 mg/dL (ref 70–99)
Potassium: 4.2 mEq/L (ref 3.5–5.1)
Sodium: 138 mEq/L (ref 135–145)

## 2014-03-01 LAB — HEPATIC FUNCTION PANEL
ALT: 10 U/L (ref 0–35)
AST: 14 U/L (ref 0–37)
Albumin: 3.8 g/dL (ref 3.5–5.2)
Alkaline Phosphatase: 74 U/L (ref 39–117)
Bilirubin, Direct: 0 mg/dL (ref 0.0–0.3)
Total Bilirubin: 0.6 mg/dL (ref 0.2–1.2)
Total Protein: 6.5 g/dL (ref 6.0–8.3)

## 2014-03-01 LAB — TSH: TSH: 0.37 u[IU]/mL (ref 0.35–4.50)

## 2014-03-03 ENCOUNTER — Ambulatory Visit: Payer: Medicare Other | Admitting: Physical Therapy

## 2014-03-03 DIAGNOSIS — IMO0001 Reserved for inherently not codable concepts without codable children: Secondary | ICD-10-CM | POA: Diagnosis not present

## 2014-03-08 ENCOUNTER — Ambulatory Visit: Payer: Medicare Other | Admitting: Physical Therapy

## 2014-03-08 ENCOUNTER — Ambulatory Visit (INDEPENDENT_AMBULATORY_CARE_PROVIDER_SITE_OTHER): Payer: Medicare Other | Admitting: Internal Medicine

## 2014-03-08 ENCOUNTER — Encounter: Payer: Self-pay | Admitting: Internal Medicine

## 2014-03-08 VITALS — BP 108/67 | HR 72 | Temp 97.9°F | Ht 61.0 in | Wt 146.0 lb

## 2014-03-08 DIAGNOSIS — S52502S Unspecified fracture of the lower end of left radius, sequela: Secondary | ICD-10-CM

## 2014-03-08 DIAGNOSIS — E785 Hyperlipidemia, unspecified: Secondary | ICD-10-CM

## 2014-03-08 DIAGNOSIS — F17200 Nicotine dependence, unspecified, uncomplicated: Secondary | ICD-10-CM

## 2014-03-08 DIAGNOSIS — I10 Essential (primary) hypertension: Secondary | ICD-10-CM

## 2014-03-08 DIAGNOSIS — E039 Hypothyroidism, unspecified: Secondary | ICD-10-CM

## 2014-03-08 DIAGNOSIS — IMO0001 Reserved for inherently not codable concepts without codable children: Secondary | ICD-10-CM | POA: Diagnosis not present

## 2014-03-08 DIAGNOSIS — S42309S Unspecified fracture of shaft of humerus, unspecified arm, sequela: Secondary | ICD-10-CM

## 2014-03-08 MED ORDER — POTASSIUM CHLORIDE CRYS ER 20 MEQ PO TBCR: 60.0000 meq | EXTENDED_RELEASE_TABLET | Freq: Every day | ORAL | Status: DC

## 2014-03-08 MED ORDER — FUROSEMIDE 20 MG PO TABS: 20.0000 mg | ORAL_TABLET | Freq: Every day | ORAL | Status: DC | PRN

## 2014-03-08 MED ORDER — FUROSEMIDE 20 MG PO TABS
20.0000 mg | ORAL_TABLET | Freq: Every day | ORAL | Status: DC | PRN
Start: 2014-03-08 — End: 2014-03-08

## 2014-03-08 MED ORDER — SIMVASTATIN 20 MG PO TABS: 20.0000 mg | ORAL_TABLET | Freq: Every day | ORAL | Status: DC

## 2014-03-08 NOTE — Progress Notes (Signed)
Pre visit review using our clinic review tool, if applicable. No additional management support is needed unless otherwise documented below in the visit note. 

## 2014-03-08 NOTE — Patient Instructions (Signed)
Please complete the following lab tests before your next follow up appointment: BMET - 401.9 FLP, LFTs - 272.4 See handout on low saturated fat diet

## 2014-03-08 NOTE — Assessment & Plan Note (Signed)
Stable.  No change in medication.  BP: 108/67 mmHg   Lab Results  Component Value Date   CREATININE 0.8 03/01/2014   Lab Results  Component Value Date   NA 138 03/01/2014   K 4.2 03/01/2014   CL 103 03/01/2014   CO2 27 03/01/2014

## 2014-03-08 NOTE — Assessment & Plan Note (Signed)
TSH at goal.  Continue same dose thyroid replacement. Lab Results  Component Value Date   TSH 0.37 03/01/2014

## 2014-03-08 NOTE — Assessment & Plan Note (Signed)
Strongly encouraged tobacco cessation.   Chantix cost prohibitive. Use OTC nicotine replacement.

## 2014-03-08 NOTE — Progress Notes (Signed)
Subjective:    Patient ID: Joan [name], female    DOB: 1945-08-03, 68 y.o.   MRN: [id number]  HPI  68 year old white female with history of hypertension, tobacco use and low back surgery for followup. Interval medical history-patient suffered fall on 12/05/2013 resulting in left radial fracture. Her injury worsened requiring surgery. She is currently undergoing rehabilitation.  Her previous DEXA scan from 2014 reviewed. Patient noted to have mild osteopenia.  Patient fractured her left wrist accidentally.  She fell backwards while resting one leg a rotating stool.  Hypertension - stable.  Her electrolytes and kidney function are stable.  Tobacco use she still smokes one pack per day. She is still precontemplative in smoking cessation.   Review of Systems Slight worsening of her back pain after fall, negative for chest pain    Past Medical History  Diagnosis Date  . Chronic low back pain     followed by Dr Hardin Negus pain mgt  . Hyperlipemia   . Tobacco abuse   . Hypothyroidism   . History of cardiac arrhythmia     cardiologist- Traci Turner  . Esophageal stricture   . Thyroid nodule   . Colon polyp   . Dysrhythmia 2008    IRREG     . PONV (postoperative nausea and vomiting)     Pt reports symptoms are the result of gallbladder and cholecystectomy, not anesthesia  . Heart murmur     "slight; not on RX" (06/24/2013)  . Sleep apnea 1990's    "tested; tried mask; couldn't stand it; told me as long as I slept on my side I'd be ok" (06/24/2013)  . Osteoarthritis of left knee     advanced  . Arthritis     "shoulders; wrist; probably spine" (06/24/2013)  . GERD (gastroesophageal reflux disease)   . Constipation     History   Social History  . Marital Status: Divorced    Spouse Name: N/A    Number of Children: N/A  . Years of Education: N/A   Occupational History  . Not on file.   Social History Main Topics  . Smoking status: Current Every Day Smoker -- 1.50  packs/day for 35 years    Types: Cigarettes  . Smokeless tobacco: Never Used  . Alcohol Use: No  . Drug Use: No  . Sexual Activity: Not Currently   Other Topics Concern  . Not on file   Social History Narrative   Divorced   Current Smoker  1 ppd -  20 yrs      Alcohol use-no       [employer] - laid off     Past Surgical History  Procedure Laterality Date  . Lumbar fusion      and rods  . Cesarean section  1972  . Ankle surgery Left 1995    "tendon repair" (06/24/2013)  . Infusion pump implantation  1990's    "implantablet morphine pump; took it out w/in 11 months  . Knee arthroscopy Left 1991; ~ 1993  . Elbow surgery  1990's  . Thyroidectomy  2012  . Cervical spine surgery  2012  . Foot surgery Right 2012    SPUR REMOVED  . Shoulder arthroscopy Left 09/2011  . Lumbar laminectomy/decompression microdiscectomy N/A 11/28/2012    Procedure: DECOMPRESSIVE LUMBAR LAMINECTOMY LEVEL 1;  Surgeon: Elaina Hoops, MD;  Location: [facility];  Service: Neurosurgery;  Laterality: N/A;  DECOMPRESSIVE LUMBAR LAMINECTOMY LEVEL 1  . Back surgery      "  think today was my 8th back OR" (06/24/2013)  . Cholecystectomy N/A 04/16/2013    Procedure: LAPAROSCOPIC CHOLECYSTECTOMY ;  Surgeon: Imogene Burn. Georgette Dover, MD;  Location: [facility];  Service: General;  Laterality: N/A;  . Laparoscopic lysis of adhesions N/A 04/16/2013    Procedure: LAPAROSCOPIC LYSIS OF ADHESIONS;  Surgeon: Imogene Burn. Tsuei, MD;  Location: [facility];  Service: General;  Laterality: N/A;  . Posterior fusion lumbar spine  06/24/2013  . Abdominal hysterectomy  1988    "partial" (06/24/2013)  . Orif distal radius fracture Left 12/30/2013    dr Caralyn Guile  . Orif wrist fracture Left 12/30/2013    Procedure: OPEN REDUCTION INTERNAL FIXATION (ORIF) LEFT WRIST FRACTURE AND REPAIR AS INDICATED;  Surgeon: Linna Hoff, MD;  Location: [facility];  Service: Orthopedics;  Laterality: Left;    Family History  Problem Relation Age of Onset    . Pancreatic cancer Father     deceased age 53  . Cancer Father   . Diabetes type II Mother     deceased age 62  . Hypertension Mother   . Coronary artery disease Mother   . Diabetes Mother   . Cancer Mother     Thyroid and Skin  . Diabetes Sister   . Hypertension Sister   . Cancer Sister     Breast  . Other Neg Hx     No family history of  colon cancer  . Hypertension Brother     Allergies  Allergen Reactions  . Morphine And Related Swelling  . Amitriptyline Other (See Comments)    Leg swelling  . Gabapentin Itching    neurontin  . Triamterene Itching and Rash    Current Outpatient Prescriptions on File Prior to Visit  Medication Sig Dispense Refill  . bisacodyl (DULCOLAX) 5 MG EC tablet Take 5-10 mg by mouth at bedtime as needed for mild constipation (depending on amount of pain medication taken).   30 tablet    . carisoprodol (SOMA) 350 MG tablet Take 350 mg by mouth 3 (three) times daily. For muscle spasms      . Cholecalciferol (VITAMIN D3) 5000 UNITS TABS Take 5,000 Units by mouth daily.       . clonazePAM (KLONOPIN) 1 MG tablet Take 1 mg by mouth at bedtime.      . Cyanocobalamin (VITAMIN B-12) 2500 MCG SUBL Place 5,000 mcg under the tongue daily.       Marland Kitchen estradiol (ESTRACE) 0.5 MG tablet Take 0.5 tablets (0.25 mg total) by mouth at bedtime.      Marland Kitchen glucosamine-chondroitin 500-400 MG tablet Take 2 tablets by mouth daily.      . hydrochlorothiazide (HYDRODIURIL) 25 MG tablet Take 1 tablet (25 mg total) by mouth daily.  90 tablet  1  . HYDROcodone-acetaminophen (NORCO) 10-325 MG per tablet Take 1 tablet by mouth every 4 (four) hours.      Marland Kitchen levothyroxine (SYNTHROID, LEVOTHROID) 88 MCG tablet Take 1 tablet (88 mcg total) by mouth daily before breakfast.  90 tablet  1  . Omega-3 Fatty Acids (FISH OIL) 1200 MG CAPS Take 2,400 mg by mouth daily.      Marland Kitchen omeprazole (PRILOSEC) 40 MG capsule Take 1 capsule (40 mg total) by mouth daily.  90 capsule  1  . triamcinolone cream  (KENALOG) 0.1 % Apply 1 application topically daily.       . vitamin C (ASCORBIC ACID) 500 MG tablet Take 1 tablet (500 mg total) by mouth daily.  50 tablet  0  . docusate sodium (COLACE) 100 MG capsule Take 1 capsule (100 mg total) by mouth 2 (two) times daily.  10 capsule  0  . methocarbamol (ROBAXIN) 500 MG tablet Take 1 tablet (500 mg total) by mouth 4 (four) times daily.  25 tablet  0  . oxyCODONE-acetaminophen (ROXICET) 5-325 MG per tablet Take 1 tablet by mouth every 4 (four) hours as needed for severe pain.  45 tablet  0   No current facility-administered medications on file prior to visit.    BP 108/67  Pulse 72  Temp(Src) 97.9 F (36.6 C) (Oral)  Ht 5\' 1"  (1.549 m)  Wt 146 lb (66.225 kg)  BMI 27.60 kg/m2  SpO2 99%    Objective:   Physical Exam  Constitutional: She is oriented to person, place, and time. She appears well-developed and well-nourished. No distress.  Cardiovascular: Normal rate, regular rhythm and normal heart sounds.   No murmur heard. Pulmonary/Chest: Effort normal and breath sounds normal. She has no wheezes.  Musculoskeletal:  Left wrist in soft splint.  Mild left hand swelling.  Neurological: She is alert and oriented to person, place, and time. No cranial nerve deficit.  Psychiatric: She has a normal mood and affect. Her behavior is normal.          Assessment & Plan:

## 2014-03-08 NOTE — Assessment & Plan Note (Signed)
S/P OPEN REDUCTION INTERNAL FIXATION (ORIF) LEFT WRIST FRACTURE.    Continue to occupational therapy.  Her previous DEXA in 2014 showed mild osteopenia.  Continue calcium and vit D supplementation.

## 2014-03-08 NOTE — Assessment & Plan Note (Signed)
Patient taking simvastatin 20 mg.  LDL not at goal.  Additional dietary changes recommended.  Low saturated fat diet handout provided. Lab Results  Component Value Date   CHOL 203* 03/01/2014   HDL 63.50 03/01/2014   LDLCALC 111* 03/01/2014   LDLDIRECT 78.9 05/06/2008   TRIG 144.0 03/01/2014   CHOLHDL 3 03/01/2014

## 2014-03-24 ENCOUNTER — Other Ambulatory Visit: Payer: Self-pay

## 2014-03-24 DIAGNOSIS — Z1231 Encounter for screening mammogram for malignant neoplasm of breast: Secondary | ICD-10-CM

## 2014-03-25 ENCOUNTER — Ambulatory Visit: Payer: Medicare Other | Attending: Orthopedic Surgery | Admitting: Physical Therapy

## 2014-03-25 DIAGNOSIS — M2560 Stiffness of unspecified joint, not elsewhere classified: Secondary | ICD-10-CM | POA: Insufficient documentation

## 2014-03-25 DIAGNOSIS — Z9889 Other specified postprocedural states: Secondary | ICD-10-CM | POA: Diagnosis not present

## 2014-03-25 DIAGNOSIS — Z981 Arthrodesis status: Secondary | ICD-10-CM | POA: Diagnosis not present

## 2014-03-25 DIAGNOSIS — M25549 Pain in joints of unspecified hand: Secondary | ICD-10-CM | POA: Diagnosis not present

## 2014-03-25 DIAGNOSIS — M25539 Pain in unspecified wrist: Secondary | ICD-10-CM | POA: Insufficient documentation

## 2014-03-25 DIAGNOSIS — S98139A Complete traumatic amputation of one unspecified lesser toe, initial encounter: Secondary | ICD-10-CM | POA: Diagnosis not present

## 2014-03-25 DIAGNOSIS — IMO0001 Reserved for inherently not codable concepts without codable children: Secondary | ICD-10-CM | POA: Insufficient documentation

## 2014-03-25 DIAGNOSIS — M6281 Muscle weakness (generalized): Secondary | ICD-10-CM | POA: Diagnosis not present

## 2014-03-30 ENCOUNTER — Ambulatory Visit: Payer: Medicare Other | Admitting: Physical Therapy

## 2014-03-30 DIAGNOSIS — IMO0001 Reserved for inherently not codable concepts without codable children: Secondary | ICD-10-CM | POA: Diagnosis not present

## 2014-04-06 ENCOUNTER — Telehealth: Payer: Self-pay | Admitting: Internal Medicine

## 2014-04-06 ENCOUNTER — Ambulatory Visit: Payer: Medicare Other | Admitting: Physical Therapy

## 2014-04-06 DIAGNOSIS — IMO0001 Reserved for inherently not codable concepts without codable children: Secondary | ICD-10-CM | POA: Diagnosis not present

## 2014-04-06 MED ORDER — LEVOTHYROXINE SODIUM 88 MCG PO TABS: 88.0000 ug | ORAL_TABLET | Freq: Every day | ORAL | Status: DC

## 2014-04-06 NOTE — Telephone Encounter (Signed)
[facility] (MAIL ORDER) [facility] - [place], [place] is requesting 90 day re-fill on levothyroxine (SYNTHROID, LEVOTHROID) 88 MCG tablet

## 2014-04-06 NOTE — Telephone Encounter (Signed)
rx sent in electronically 

## 2014-04-08 ENCOUNTER — Ambulatory Visit
Admission: RE | Admit: 2014-04-08 | Discharge: 2014-04-08 | Disposition: A | Discharge status: Home / Self Care | Payer: Medicare Other | Source: Ambulatory Visit

## 2014-04-08 DIAGNOSIS — Z1231 Encounter for screening mammogram for malignant neoplasm of breast: Secondary | ICD-10-CM

## 2014-04-13 ENCOUNTER — Ambulatory Visit (INDEPENDENT_AMBULATORY_CARE_PROVIDER_SITE_OTHER): Payer: Medicare Other | Admitting: *Deleted

## 2014-04-13 DIAGNOSIS — Z23 Encounter for immunization: Secondary | ICD-10-CM

## 2014-04-14 ENCOUNTER — Ambulatory Visit: Payer: Medicare Other | Attending: Orthopedic Surgery | Admitting: Physical Therapy

## 2014-04-14 DIAGNOSIS — M25632 Stiffness of left wrist, not elsewhere classified: Secondary | ICD-10-CM | POA: Diagnosis not present

## 2014-04-14 DIAGNOSIS — M79632 Pain in left forearm: Secondary | ICD-10-CM | POA: Insufficient documentation

## 2014-04-14 DIAGNOSIS — M545 Low back pain: Secondary | ICD-10-CM | POA: Diagnosis not present

## 2014-04-14 DIAGNOSIS — Z9889 Other specified postprocedural states: Secondary | ICD-10-CM | POA: Insufficient documentation

## 2014-04-14 DIAGNOSIS — M25532 Pain in left wrist: Secondary | ICD-10-CM | POA: Insufficient documentation

## 2014-04-14 DIAGNOSIS — M6281 Muscle weakness (generalized): Secondary | ICD-10-CM | POA: Insufficient documentation

## 2014-04-20 ENCOUNTER — Telehealth: Payer: Self-pay | Admitting: Internal Medicine

## 2014-04-20 NOTE — Telephone Encounter (Signed)
Pt needs a refill on clonazepam 1mg  call into [facility] [address] in [place]

## 2014-04-21 MED ORDER — CLONAZEPAM 1 MG PO TABS: 1.0000 mg | ORAL_TABLET | Freq: Every day | ORAL | Status: DC

## 2014-04-21 NOTE — Telephone Encounter (Signed)
Ok to RF x 3 

## 2014-04-21 NOTE — Telephone Encounter (Signed)
rx called in

## 2014-05-04 ENCOUNTER — Other Ambulatory Visit: Payer: Self-pay | Admitting: Dermatology

## 2014-05-04 DIAGNOSIS — L570 Actinic keratosis: Secondary | ICD-10-CM | POA: Insufficient documentation

## 2014-05-05 ENCOUNTER — Telehealth: Payer: Self-pay | Admitting: Internal Medicine

## 2014-05-05 NOTE — Telephone Encounter (Signed)
[facility] (MAIL ORDER) [facility] - [place], Joan [name] is requesting re-fill on hydrochlorothiazide (HYDRODIURIL) 25 MG tablet

## 2014-05-06 MED ORDER — HYDROCHLOROTHIAZIDE 25 MG PO TABS: 25.0000 mg | ORAL_TABLET | Freq: Every day | ORAL | Status: DC

## 2014-05-06 NOTE — Telephone Encounter (Signed)
rx sent in electronically 

## 2014-06-07 ENCOUNTER — Encounter: Payer: Self-pay | Admitting: Internal Medicine

## 2014-06-07 ENCOUNTER — Ambulatory Visit (INDEPENDENT_AMBULATORY_CARE_PROVIDER_SITE_OTHER): Payer: Medicare Other | Admitting: Internal Medicine

## 2014-06-07 VITALS — BP 122/80 | HR 75 | Temp 98.2°F | Ht 61.0 in | Wt 145.0 lb

## 2014-06-07 DIAGNOSIS — Z720 Tobacco use: Secondary | ICD-10-CM

## 2014-06-07 DIAGNOSIS — I10 Essential (primary) hypertension: Secondary | ICD-10-CM

## 2014-06-07 DIAGNOSIS — S52502S Unspecified fracture of the lower end of left radius, sequela: Secondary | ICD-10-CM

## 2014-06-07 DIAGNOSIS — F17200 Nicotine dependence, unspecified, uncomplicated: Secondary | ICD-10-CM

## 2014-06-07 DIAGNOSIS — Z23 Encounter for immunization: Secondary | ICD-10-CM

## 2014-06-07 NOTE — Assessment & Plan Note (Signed)
Contact information for smoking cessation classes provided.  Patient using nicotine lozenges on a limited basis.  We discussed how smoking cessation will likely help healing from of left radial fracture.  We also discussed importance of smoking cessation in stroke prevention.

## 2014-06-07 NOTE — Progress Notes (Signed)
Pre visit review using our clinic review tool, if applicable. No additional management support is needed unless otherwise documented below in the visit note. 

## 2014-06-07 NOTE — Patient Instructions (Signed)
Please complete the following lab tests before your next follow up appointment: BMET - 401.9 FLP, LFTs - 272.4 Schedule next office visit as Medicare Wellness Exam

## 2014-06-07 NOTE — Assessment & Plan Note (Signed)
Hand / wrist mobility and hand swelling improving with rehab / PT.

## 2014-06-07 NOTE — Assessment & Plan Note (Signed)
Stable. BP: 122/80 mmHg

## 2014-06-07 NOTE — Progress Notes (Signed)
Subjective:    Patient ID: Joan [name], female    DOB: 1946/01/23, 68 y.o.   MRN: [id number]  HPI  68 year old white female with history of hypertension, hypothyroidism and left radial fracture for follow-up. Patient underwent left wrist surgery and now has plate and screws. Hand rehabilitation/physical therapy helping with mobility.  Hypertension-stable.  Chronic tobacco use-patient reports smoking slightly less than 1 pack per day. She is interested in quitting smoking.  Chronic back pain - followed by neurosurgeon   Review of Systems Negative for cough or shortness of breath    Past Medical History  Diagnosis Date  . Chronic low back pain     followed by Dr Hardin Negus pain mgt  . Hyperlipemia   . Tobacco abuse   . Hypothyroidism   . History of cardiac arrhythmia     cardiologist- Traci Turner  . Esophageal stricture   . Thyroid nodule   . Colon polyp   . Dysrhythmia 2008    IRREG     . PONV (postoperative nausea and vomiting)     Pt reports symptoms are the result of gallbladder and cholecystectomy, not anesthesia  . Heart murmur     "slight; not on RX" (06/24/2013)  . Sleep apnea 1990's    "tested; tried mask; couldn't stand it; told me as long as I slept on my side I'd be ok" (06/24/2013)  . Osteoarthritis of left knee     advanced  . Arthritis     "shoulders; wrist; probably spine" (06/24/2013)  . GERD (gastroesophageal reflux disease)   . Constipation     History   Social History  . Marital Status: Divorced    Spouse Name: N/A    Number of Children: N/A  . Years of Education: N/A   Occupational History  . Not on file.   Social History Main Topics  . Smoking status: Current Every Day Smoker -- 1.50 packs/day for 35 years    Types: Cigarettes  . Smokeless tobacco: Never Used  . Alcohol Use: No  . Drug Use: No  . Sexual Activity: Not Currently   Other Topics Concern  . Not on file   Social History Narrative   Divorced   Current Smoker  1  ppd -  20 yrs      Alcohol use-no       [employer] - laid off     Past Surgical History  Procedure Laterality Date  . Lumbar fusion      and rods  . Cesarean section  1972  . Ankle surgery Left 1995    "tendon repair" (06/24/2013)  . Infusion pump implantation  1990's    "implantablet morphine pump; took it out w/in 11 months  . Knee arthroscopy Left 1991; ~ 1993  . Elbow surgery  1990's  . Thyroidectomy  2012  . Cervical spine surgery  2012  . Foot surgery Right 2012    SPUR REMOVED  . Shoulder arthroscopy Left 09/2011  . Lumbar laminectomy/decompression microdiscectomy N/A 11/28/2012    Procedure: DECOMPRESSIVE LUMBAR LAMINECTOMY LEVEL 1;  Surgeon: Elaina Hoops, MD;  Location: [facility];  Service: Neurosurgery;  Laterality: N/A;  DECOMPRESSIVE LUMBAR LAMINECTOMY LEVEL 1  . Back surgery      "think today was my 8th back OR" (06/24/2013)  . Cholecystectomy N/A 04/16/2013    Procedure: LAPAROSCOPIC CHOLECYSTECTOMY ;  Surgeon: Imogene Burn. Georgette Dover, MD;  Location: [facility];  Service: General;  Laterality: N/A;  . Laparoscopic lysis of  adhesions N/A 04/16/2013    Procedure: LAPAROSCOPIC LYSIS OF ADHESIONS;  Surgeon: Imogene Burn. Tsuei, MD;  Location: [facility];  Service: General;  Laterality: N/A;  . Posterior fusion lumbar spine  06/24/2013  . Abdominal hysterectomy  1988    "partial" (06/24/2013)  . Orif distal radius fracture Left 12/30/2013    dr Caralyn Guile  . Orif wrist fracture Left 12/30/2013    Procedure: OPEN REDUCTION INTERNAL FIXATION (ORIF) LEFT WRIST FRACTURE AND REPAIR AS INDICATED;  Surgeon: Linna Hoff, MD;  Location: [facility];  Service: Orthopedics;  Laterality: Left;    Family History  Problem Relation Age of Onset  . Pancreatic cancer Father     deceased age 63  . Cancer Father   . Diabetes type II Mother     deceased age 50  . Hypertension Mother   . Coronary artery disease Mother   . Diabetes Mother   . Cancer Mother     Thyroid and Skin  . Diabetes  Sister   . Hypertension Sister   . Cancer Sister     Breast  . Other Neg Hx     No family history of  colon cancer  . Hypertension Brother     Allergies  Allergen Reactions  . Morphine And Related Swelling  . Amitriptyline Other (See Comments)    Leg swelling  . Gabapentin Itching    neurontin  . Triamterene Itching and Rash    Current Outpatient Prescriptions on File Prior to Visit  Medication Sig Dispense Refill  . bisacodyl (DULCOLAX) 5 MG EC tablet Take 5-10 mg by mouth at bedtime as needed for mild constipation (depending on amount of pain medication taken).  30 tablet   . carisoprodol (SOMA) 350 MG tablet Take 350 mg by mouth 3 (three) times daily. For muscle spasms    . Cholecalciferol (VITAMIN D3) 5000 UNITS TABS Take 5,000 Units by mouth daily.     . clonazePAM (KLONOPIN) 1 MG tablet Take 1 tablet (1 mg total) by mouth at bedtime. 30 tablet 3  . Cyanocobalamin (VITAMIN B-12) 2500 MCG SUBL Place 5,000 mcg under the tongue daily.     Marland Kitchen docusate sodium (COLACE) 100 MG capsule Take 1 capsule (100 mg total) by mouth 2 (two) times daily. 10 capsule 0  . estradiol (ESTRACE) 0.5 MG tablet Take 0.5 tablets (0.25 mg total) by mouth at bedtime.    . furosemide (LASIX) 20 MG tablet Take 1 tablet (20 mg total) by mouth daily as needed for fluid. 30 tablet 2  . glucosamine-chondroitin 500-400 MG tablet Take 2 tablets by mouth daily.    . hydrochlorothiazide (HYDRODIURIL) 25 MG tablet Take 1 tablet (25 mg total) by mouth daily. 90 tablet 1  . HYDROcodone-acetaminophen (NORCO) 10-325 MG per tablet Take 1 tablet by mouth every 4 (four) hours.    Marland Kitchen levothyroxine (SYNTHROID, LEVOTHROID) 88 MCG tablet Take 1 tablet (88 mcg total) by mouth daily before breakfast. 90 tablet 1  . methocarbamol (ROBAXIN) 500 MG tablet Take 1 tablet (500 mg total) by mouth 4 (four) times daily. 25 tablet 0  . Omega-3 Fatty Acids (FISH OIL) 1200 MG CAPS Take 2,400 mg by mouth daily.    Marland Kitchen omeprazole (PRILOSEC) 40 MG  capsule Take 1 capsule (40 mg total) by mouth daily. 90 capsule 1  . oxyCODONE-acetaminophen (ROXICET) 5-325 MG per tablet Take 1 tablet by mouth every 4 (four) hours as needed for severe pain. 45 tablet 0  . potassium chloride SA (K-DUR,KLOR-CON) 20  MEQ tablet Take 3 tablets (60 mEq total) by mouth daily. 270 tablet 1  . simvastatin (ZOCOR) 20 MG tablet Take 1 tablet (20 mg total) by mouth at bedtime. 90 tablet 1  . triamcinolone cream (KENALOG) 0.1 % Apply 1 application topically daily.     . vitamin C (ASCORBIC ACID) 500 MG tablet Take 1 tablet (500 mg total) by mouth daily. 50 tablet 0   No current facility-administered medications on file prior to visit.    BP 122/80 mmHg  Pulse 75  Temp(Src) 98.2 F (36.8 C) (Oral)  Ht 5\' 1"  (1.549 m)  Wt 145 lb (65.772 kg)  BMI 27.41 kg/m2    Objective:   Physical Exam  Constitutional: She is oriented to person, place, and time. She appears well-developed and well-nourished. No distress.  Cardiovascular: Normal rate, regular rhythm and normal heart sounds.   No murmur heard. Pulmonary/Chest: Effort normal and breath sounds normal. She has no wheezes.  Neurological: She is alert and oriented to person, place, and time.  Psychiatric: She has a normal mood and affect. Her behavior is normal.          Assessment & Plan:

## 2014-06-09 ENCOUNTER — Ambulatory Visit: Payer: Medicare Other | Attending: Neurosurgery | Admitting: Rehabilitation

## 2014-06-09 DIAGNOSIS — M546 Pain in thoracic spine: Secondary | ICD-10-CM | POA: Diagnosis present

## 2014-06-16 ENCOUNTER — Ambulatory Visit: Payer: Medicare Other | Admitting: Rehabilitation

## 2014-06-16 DIAGNOSIS — M546 Pain in thoracic spine: Secondary | ICD-10-CM | POA: Diagnosis not present

## 2014-06-22 ENCOUNTER — Ambulatory Visit: Payer: Medicare Other | Admitting: Rehabilitation

## 2014-06-22 DIAGNOSIS — M546 Pain in thoracic spine: Secondary | ICD-10-CM | POA: Diagnosis not present

## 2014-06-24 ENCOUNTER — Ambulatory Visit: Payer: Medicare Other | Admitting: Rehabilitation

## 2014-06-24 DIAGNOSIS — M546 Pain in thoracic spine: Secondary | ICD-10-CM | POA: Diagnosis not present

## 2014-06-29 ENCOUNTER — Ambulatory Visit: Payer: Medicare Other | Admitting: Rehabilitation

## 2014-06-29 DIAGNOSIS — M546 Pain in thoracic spine: Secondary | ICD-10-CM | POA: Diagnosis not present

## 2014-07-01 ENCOUNTER — Ambulatory Visit: Payer: Medicare Other | Admitting: Rehabilitation

## 2014-07-01 DIAGNOSIS — M546 Pain in thoracic spine: Secondary | ICD-10-CM | POA: Diagnosis not present

## 2014-07-05 ENCOUNTER — Other Ambulatory Visit: Payer: Self-pay | Admitting: Internal Medicine

## 2014-07-06 ENCOUNTER — Other Ambulatory Visit: Payer: Self-pay | Admitting: *Deleted

## 2014-07-06 ENCOUNTER — Ambulatory Visit: Payer: Medicare Other | Admitting: Rehabilitation

## 2014-07-06 DIAGNOSIS — M546 Pain in thoracic spine: Secondary | ICD-10-CM | POA: Diagnosis not present

## 2014-07-06 MED ORDER — HYDROCHLOROTHIAZIDE 25 MG PO TABS: 25.0000 mg | ORAL_TABLET | Freq: Every day | ORAL | Status: DC

## 2014-07-08 ENCOUNTER — Ambulatory Visit: Payer: Medicare Other | Admitting: Rehabilitation

## 2014-07-08 DIAGNOSIS — M546 Pain in thoracic spine: Secondary | ICD-10-CM | POA: Diagnosis not present

## 2014-07-15 ENCOUNTER — Ambulatory Visit: Payer: Medicare Other | Attending: Neurosurgery | Admitting: Rehabilitation

## 2014-07-15 DIAGNOSIS — M546 Pain in thoracic spine: Secondary | ICD-10-CM | POA: Insufficient documentation

## 2014-07-17 ENCOUNTER — Other Ambulatory Visit: Payer: Self-pay | Admitting: Internal Medicine

## 2014-08-11 ENCOUNTER — Encounter (HOSPITAL_COMMUNITY)
Admission: RE | Admit: 2014-08-11 | Discharge: 2014-08-11 | Disposition: A | Discharge status: Home / Self Care | Payer: Medicare Other | Source: Ambulatory Visit | Attending: Obstetrics and Gynecology | Admitting: Obstetrics and Gynecology

## 2014-08-11 ENCOUNTER — Other Ambulatory Visit: Payer: Self-pay

## 2014-08-11 ENCOUNTER — Encounter (HOSPITAL_COMMUNITY): Payer: Self-pay

## 2014-08-11 DIAGNOSIS — Z01818 Encounter for other preprocedural examination: Secondary | ICD-10-CM | POA: Insufficient documentation

## 2014-08-11 DIAGNOSIS — N8320 Unspecified ovarian cysts: Secondary | ICD-10-CM | POA: Insufficient documentation

## 2014-08-11 LAB — CBC
HCT: 40 % (ref 36.0–46.0)
Hemoglobin: 13.5 g/dL (ref 12.0–15.0)
MCH: 31 pg (ref 26.0–34.0)
MCHC: 33.8 g/dL (ref 30.0–36.0)
MCV: 92 fL (ref 78.0–100.0)
Platelets: 203 10*3/uL (ref 150–400)
RBC: 4.35 MIL/uL (ref 3.87–5.11)
RDW: 13.4 % (ref 11.5–15.5)
WBC: 5.4 10*3/uL (ref 4.0–10.5)

## 2014-08-11 LAB — BASIC METABOLIC PANEL
Anion gap: 6 (ref 5–15)
BUN: 19 mg/dL (ref 6–23)
CO2: 30 mmol/L (ref 19–32)
Calcium: 8.6 mg/dL (ref 8.4–10.5)
Chloride: 103 mmol/L (ref 96–112)
Creatinine, Ser: 0.72 mg/dL (ref 0.50–1.10)
GFR calc Af Amer: >90 mL/min (ref >90)
GFR calc non Af Amer: 86 mL/min — ABNORMAL LOW (ref >90)
Glucose, Bld: 103 mg/dL — ABNORMAL HIGH (ref 70–99)
Potassium: 3.3 mmol/L — ABNORMAL LOW (ref 3.5–5.1)
Sodium: 139 mmol/L (ref 135–145)

## 2014-08-11 MED ORDER — PROPOFOL 10 MG/ML IV BOLUS
INTRAVENOUS | Status: AC
  Filled 2014-08-11: qty 20

## 2014-08-11 MED ORDER — DEXAMETHASONE SODIUM PHOSPHATE 10 MG/ML IJ SOLN
INTRAMUSCULAR | Status: AC
  Filled 2014-08-11: qty 1

## 2014-08-11 MED ORDER — NEOSTIGMINE METHYLSULFATE 10 MG/10ML IV SOLN
INTRAVENOUS | Status: AC
  Filled 2014-08-11: qty 1

## 2014-08-11 MED ORDER — FENTANYL CITRATE 0.05 MG/ML IJ SOLN
INTRAMUSCULAR | Status: AC
  Filled 2014-08-11: qty 5

## 2014-08-11 MED ORDER — ROCURONIUM BROMIDE 100 MG/10ML IV SOLN
INTRAVENOUS | Status: AC
  Filled 2014-08-11: qty 1

## 2014-08-11 MED ORDER — GLYCOPYRROLATE 0.2 MG/ML IJ SOLN
INTRAMUSCULAR | Status: AC
  Filled 2014-08-11: qty 3

## 2014-08-11 MED ORDER — LIDOCAINE HCL (CARDIAC) 20 MG/ML IV SOLN
INTRAVENOUS | Status: AC
  Filled 2014-08-11: qty 5

## 2014-08-11 MED ORDER — KETOROLAC TROMETHAMINE 30 MG/ML IJ SOLN
INTRAMUSCULAR | Status: AC
  Filled 2014-08-11: qty 1

## 2014-08-11 MED ORDER — ONDANSETRON HCL 4 MG/2ML IJ SOLN
INTRAMUSCULAR | Status: AC
  Filled 2014-08-11: qty 2

## 2014-08-11 MED ORDER — SUCCINYLCHOLINE CHLORIDE 20 MG/ML IJ SOLN
INTRAMUSCULAR | Status: AC
  Filled 2014-08-11: qty 1

## 2014-08-11 MED ORDER — MIDAZOLAM HCL 2 MG/2ML IJ SOLN
INTRAMUSCULAR | Status: AC
  Filled 2014-08-11: qty 2

## 2014-08-11 NOTE — Patient Instructions (Addendum)
Your procedure is scheduled on:08/20/14  Enter through the Main Entrance at :[place] up desk phone and dial [phone] and inform us of your arrival.  Please call [phone] if you have any problems the morning of surgery.  Remember: Do not eat food or drink liquids, including water, after midnight:Thursday    You may brush your teeth the morning of surgery.  Take these meds the morning of surgery with a sip of water:thyroid pill, HCTZ, pain meds if needed  DO NOT wear jewelry, eye make-up, lipstick,body lotion, or dark fingernail polish.  (Polished toes are ok) You may wear deodorant.  If you are to be admitted after surgery, leave suitcase in car until your room has been assigned. Patients discharged on the day of surgery will not be allowed to drive home. Wear loose fitting, comfortable clothes for your ride home.

## 2014-08-11 NOTE — Pre-Procedure Instructions (Addendum)
Per Dr. Royce Macadamia, pt needs cardiac clearance. Appt made with Dr. Fransico Him on 08/13/14. Stacy in Dr. Malachi Carl office notified.

## 2014-08-13 ENCOUNTER — Encounter: Payer: Self-pay | Admitting: Cardiology

## 2014-08-13 ENCOUNTER — Ambulatory Visit (INDEPENDENT_AMBULATORY_CARE_PROVIDER_SITE_OTHER): Payer: Medicare Other | Admitting: Cardiology

## 2014-08-13 VITALS — BP 124/80 | HR 86 | Ht 62.0 in | Wt 153.4 lb

## 2014-08-13 DIAGNOSIS — N83202 Unspecified ovarian cyst, left side: Secondary | ICD-10-CM

## 2014-08-13 DIAGNOSIS — N83201 Unspecified ovarian cyst, right side: Secondary | ICD-10-CM

## 2014-08-13 DIAGNOSIS — N8320 Unspecified ovarian cysts: Secondary | ICD-10-CM

## 2014-08-13 DIAGNOSIS — I493 Ventricular premature depolarization: Secondary | ICD-10-CM

## 2014-08-13 DIAGNOSIS — N83209 Unspecified ovarian cyst, unspecified side: Secondary | ICD-10-CM | POA: Insufficient documentation

## 2014-08-13 DIAGNOSIS — Z01810 Encounter for preprocedural cardiovascular examination: Secondary | ICD-10-CM

## 2014-08-13 DIAGNOSIS — I10 Essential (primary) hypertension: Secondary | ICD-10-CM

## 2014-08-13 DIAGNOSIS — R9431 Abnormal electrocardiogram [ECG] [EKG]: Secondary | ICD-10-CM | POA: Insufficient documentation

## 2014-08-13 NOTE — Progress Notes (Signed)
Cardiology Office Note   Date:  08/13/2014   ID:  Joan, [name] 1945/07/10, MRN [id number]  PCP:  Joan Pry, DO  Cardiologist:   Joan Margarita, MD   No chief complaint on file.     History of Present Illness: Joan [name] is a 69 y.o. female who presents for cardiac clearance prior to undergoing robioic assisted salphingoophorectomy due to ovarian cysts.  Her preop EKG was noted to be abnormal and she was referred to Cardiology for peroperative evaluation.  She denies any chest pain or pressure.  She denies any SOB, DOE, dizziness, palpitations or syncope.  She has chronic LE edema which is stable. She had been exercising but then had back surgery and then wrist surgery last spring and has not exercised for a year.  She does not do any walking even a few blocks.      Past Medical History  Diagnosis Date  . Chronic low back pain     followed by Dr Hardin Negus pain mgt  . Hyperlipemia   . Tobacco abuse   . Hypothyroidism   . History of cardiac arrhythmia     cardiologist- Joan [name]  . Esophageal stricture   . Thyroid nodule   . Colon polyp   . PONV (postoperative nausea and vomiting)     Pt reports symptoms are the result of gallbladder and cholecystectomy, not anesthesia  . Heart murmur     "slight; not on RX" (06/24/2013)  . Sleep apnea 1990's    "tested; tried mask; couldn't stand it; told me as long as I slept on my side I'd be ok" (06/24/2013)  . Osteoarthritis of left knee     advanced  . Arthritis     "shoulders; wrist; probably spine" (06/24/2013)  . GERD (gastroesophageal reflux disease)   . Constipation   . Hypertension   . PVC's (premature ventricular contractions)     Past Surgical History  Procedure Laterality Date  . Lumbar fusion      and rods  . Cesarean section  1972  . Ankle surgery Left 1995    "tendon repair" (06/24/2013)  . Infusion pump implantation  1990's    "implantablet morphine pump; took it out w/in 11 months  . Knee  arthroscopy Left 1991; ~ 1993  . Elbow surgery  1990's  . Thyroidectomy  2012  . Cervical spine surgery  2012  . Foot surgery Right 2012    SPUR REMOVED  . Shoulder arthroscopy Left 09/2011  . Lumbar laminectomy/decompression microdiscectomy N/A 11/28/2012    Procedure: DECOMPRESSIVE LUMBAR LAMINECTOMY LEVEL 1;  Surgeon: Elaina Hoops, MD;  Location: [facility];  Service: Neurosurgery;  Laterality: N/A;  DECOMPRESSIVE LUMBAR LAMINECTOMY LEVEL 1  . Back surgery      "think today was my 8th back OR" (06/24/2013)  . Cholecystectomy N/A 04/16/2013    Procedure: LAPAROSCOPIC CHOLECYSTECTOMY ;  Surgeon: Imogene Burn. Georgette Dover, MD;  Location: [facility];  Service: General;  Laterality: N/A;  . Laparoscopic lysis of adhesions N/A 04/16/2013    Procedure: LAPAROSCOPIC LYSIS OF ADHESIONS;  Surgeon: Imogene Burn. Tsuei, MD;  Location: [facility];  Service: General;  Laterality: N/A;  . Posterior fusion lumbar spine  06/24/2013  . Abdominal hysterectomy  1988    "partial" (06/24/2013)  . Orif distal radius fracture Left 12/30/2013    dr Joan [name]  . Orif wrist fracture Left 12/30/2013    Procedure: OPEN REDUCTION INTERNAL FIXATION (ORIF) LEFT WRIST FRACTURE AND REPAIR AS INDICATED;  Surgeon: Joan Hoff, MD;  Location: [facility];  Service: Orthopedics;  Laterality: Left;     Current Outpatient Prescriptions  Medication Sig Dispense Refill  . bisacodyl (DULCOLAX) 5 MG EC tablet Take 5-10 mg by mouth at bedtime as needed for mild constipation (depending on amount of pain medication taken).  30 tablet   . carisoprodol (SOMA) 350 MG tablet Take 350 mg by mouth 3 (three) times daily. For muscle spasms    . Cholecalciferol (VITAMIN D3) 5000 UNITS TABS Take 5,000 Units by mouth daily.     . clonazePAM (KLONOPIN) 1 MG tablet Take 1 tablet (1 mg total) by mouth at bedtime. 30 tablet 3  . Cyanocobalamin (VITAMIN B-12) 2500 MCG SUBL Place 5,000 mcg under the tongue daily.     Marland Kitchen estradiol (ESTRACE) 0.5 MG tablet Take 0.5 mg by mouth  at bedtime.     . furosemide (LASIX) 20 MG tablet TAKE ONE TABLET BY MOUTH ONCE DAILY AS NEEDED FOR FLUID 30 tablet 0  . glucosamine-chondroitin 500-400 MG tablet Take 2 tablets by mouth daily.    . hydrochlorothiazide (HYDRODIURIL) 25 MG tablet Take 1 tablet (25 mg total) by mouth daily. 90 tablet 1  . HYDROcodone-acetaminophen (NORCO) 10-325 MG per tablet Take 1 tablet by mouth every 4 (four) hours. For pain    . levothyroxine (SYNTHROID, LEVOTHROID) 88 MCG tablet Take 1 tablet (88 mcg total) by mouth daily before breakfast. 90 tablet 1  . omeprazole (PRILOSEC) 40 MG capsule TAKE 1 BY MOUTH TWICE DAILY 180 capsule 0  . potassium chloride SA (K-DUR,KLOR-CON) 20 MEQ tablet TAKE 3 TABLETS (60MEQ) BY MOUTH DAILY 270 tablet 0  . simvastatin (ZOCOR) 20 MG tablet Take 1 tablet (20 mg total) by mouth at bedtime. 90 tablet 1  . triamcinolone cream (KENALOG) 0.1 % Apply 1 application topically daily.     . vitamin C (ASCORBIC ACID) 500 MG tablet Take 1 tablet (500 mg total) by mouth daily. 50 tablet 0   No current facility-administered medications for this visit.    Allergies:   Morphine and related; Amitriptyline; Gabapentin; and Triamterene    Social History:  The patient  reports that she has been smoking Cigarettes.  She has a 17.5 pack-year smoking history. She has never used smokeless tobacco. She reports that she does not drink alcohol or use illicit drugs.   Family History:  The patient's family history includes Cancer in her father, mother, and sister; Coronary artery disease in her mother; Diabetes in her mother and sister; Diabetes type II in her mother; Hypertension in her brother, mother, and sister; Pancreatic cancer in her father. There is no history of Other.    ROS:  Please see the history of present illness.   Otherwise, review of systems are positive for none.   All other systems are reviewed and negative.    PHYSICAL EXAM: VS:  BP 124/80 mmHg  Pulse 86  Ht 5\' 2"  (1.575 m)   Wt 153 lb 6.4 oz (69.582 kg)  BMI 28.05 kg/m2  SpO2 97% , BMI Body mass index is 28.05 kg/(m^2). GEN: Well nourished, well developed, in no acute distress HEENT: normal Neck: no JVD, carotid bruits, or masses Cardiac: RRR; no murmurs, rubs, or gallops,no edema  Respiratory:  clear to auscultation bilaterally, normal work of breathing GI: soft, nontender, nondistended, + BS MS: no deformity or atrophy Skin: warm and dry, no rash Neuro:  Strength and sensation are intact Psych: euthymic mood, full affect   EKG:  EKG is not ordered today. The ekg ordered today d2/09/2014 showed NSR with nonspecific T wave abnormality which is new from 2014  Recent Labs: 03/01/2014: ALT 10; TSH 0.37 08/11/2014: BUN 19; Creatinine 0.72; Hemoglobin 13.5; Platelets 203; Potassium 3.3*; Sodium 139    Lipid Panel    Component Value Date/Time   CHOL 203* 03/01/2014 1347   TRIG 144.0 03/01/2014 1347   HDL 63.50 03/01/2014 1347   CHOLHDL 3 03/01/2014 1347   VLDL 28.8 03/01/2014 1347   LDLCALC 111* 03/01/2014 1347   LDLDIRECT 78.9 05/06/2008 0840      Wt Readings from Last 3 Encounters:  08/13/14 153 lb 6.4 oz (69.582 kg)  06/07/14 145 lb (65.772 kg)  03/08/14 146 lb (66.225 kg)        ASSESSMENT AND PLAN:  1.  Ovarian cysts for Robotic assisted salphingoophorectomy 2.  Preoperative cardiac clearance 3.  Abnormal EKG 4.  PVC's   Current medicines are reviewed at length with the patient today.  The patient does not have concerns regarding medicines.  The following changes have been made:  no change  Labs/ tests ordered today include: Lexiscan myoview  No orders of the defined types were placed in this encounter.     Disposition:   FU with me PRN pending results of study  SignedSueanne Margarita, MD  08/13/2014 1:46 PM    [facility] [address], [place], [place]  [address] Phone: [phone]; Fax: [phone]

## 2014-08-13 NOTE — Patient Instructions (Signed)
Your physician recommends that you continue on your current medications as directed. Please refer to the Current Medication list given to you today.  Your physician has requested that you have a lexiscan myoview. For further information please visit [email or web address]. Please follow instruction sheet, as given.  Your physician recommends that you schedule a follow-up appointment AS NEEDED with Dr. Radford Pax pending test results.

## 2014-08-16 ENCOUNTER — Ambulatory Visit (HOSPITAL_COMMUNITY): Payer: Medicare Other | Attending: Cardiovascular Disease | Admitting: Radiology

## 2014-08-16 DIAGNOSIS — Z01810 Encounter for preprocedural cardiovascular examination: Secondary | ICD-10-CM

## 2014-08-16 DIAGNOSIS — R9431 Abnormal electrocardiogram [ECG] [EKG]: Secondary | ICD-10-CM | POA: Diagnosis not present

## 2014-08-16 DIAGNOSIS — Z01818 Encounter for other preprocedural examination: Secondary | ICD-10-CM | POA: Insufficient documentation

## 2014-08-16 MED ORDER — TECHNETIUM TC 99M SESTAMIBI GENERIC - CARDIOLITE
11.0000 | Freq: Once | INTRAVENOUS | Status: AC | PRN
  Administered 2014-08-16: 11 via INTRAVENOUS

## 2014-08-16 MED ORDER — TECHNETIUM TC 99M SESTAMIBI GENERIC - CARDIOLITE
33.0000 | Freq: Once | INTRAVENOUS | Status: AC | PRN
  Administered 2014-08-16: 33 via INTRAVENOUS

## 2014-08-16 MED ORDER — REGADENOSON 0.4 MG/5ML IV SOLN
0.4000 mg | Freq: Once | INTRAVENOUS | Status: AC
  Administered 2014-08-16: 0.4 mg via INTRAVENOUS

## 2014-08-16 NOTE — Progress Notes (Signed)
[facility] SITE [facility]UC[facility] [place], [place] [address] [phone]    Cardiology Nuclear Med Study  Joan [name] is a 69 y.o. female     MRN : [id number]     DOB: 1946-06-18  Procedure Date: 08/16/2014  Nuclear Med Background Indication for Stress Test:  Evaluation for Ischemia, Surgical Clearance and Abnormal EKG History:  MPI ~10 yrs Cardiac Risk Factors: none  Symptoms:  None indicated   Nuclear Pre-Procedure Caffeine/Decaff Intake:  10:00pm NPO After: 7:30am   Lungs:  clear O2 Sat: 97% on room air. IV 0.9% NS with Angio Cath:  22g  IV Site: R Antecubital  IV Started by:  Matilde Haymaker, RN  Chest Size (in):  38 Cup Size: C  Height: 5\' 2"  (1.575 m)  Weight:  153 lb (69.4 kg)  BMI:  Body mass index is 27.98 kg/(m^2). Tech Comments:  n/a    Nuclear Med Study 1 or 2 day study: 1 day  Stress Test Type:  Lexiscan  Reading MD: n/a  Order Authorizing Provider:  Tressia Miners Turner,MD  Resting Radionuclide: Technetium 73m Sestamibi  Resting Radionuclide Dose: 11.0 mCi   Stress Radionuclide:  Technetium 2m Sestamibi  Stress Radionuclide Dose: 33.0 mCi           Stress Protocol Rest HR: 64 Stress HR: 104  Rest BP: 118/67 Stress BP: 107/59  Exercise Time (min): n/a METS: n/a   Predicted Max HR: 156 bpm % Max HR: 66.67 bpm Rate Pressure Product: 12480   Dose of Adenosine (mg):  n/a Dose of Lexiscan: 0.4 mg  Dose of Atropine (mg): n/a Dose of Dobutamine: n/a mcg/kg/min (at max HR)  Stress Test Technologist: Glade Lloyd, BS-ES  Nuclear Technologist:  Earl Many, CNMT     Rest Procedure:  Myocardial perfusion imaging was performed at rest 45 minutes following the intravenous administration of Technetium 13m Sestamibi. Rest ECG: NSR with non-specific ST-T wave changes  Stress Procedure:  The patient received IV Lexiscan 0.4 mg over 15-seconds.  Technetium 82m Sestamibi injected at 30-seconds.  Quantitative spect images were obtained after  a 45 minute delay.  During the infusion of Lexiscan the patient complained of head feeling funny, stomach upset and headache.  These symptoms began to resolve in recovery.  Stress ECG: No significant change from baseline ECG  QPS Raw Data Images:  Both arms are down for study. This causes significant soft tissue artifact. Stress Images:  There is a fixed defect along the distal anterior wall/apical wall seen at both rest and stress. No ischemia identified. There is also a fixed defect along the basal inferoseptal wall of severe degree at both rest and stress, no ischemia identified. There is also decreased uptake of mild degree along the inferolateral wall base to mid with no ischemia identified. Otherwise, homogeneous radiotracer uptake noted. Rest Images:  As described above. Subtraction (SDS):  No evidence of ischemia. Transient Ischemic Dilatation (Normal <1.22):  0.99 Lung/Heart Ratio (Normal <0.45):  0.48  Quantitative Gated Spect Images QGS EDV:  74 ml QGS ESV:  29 ml  Impression Exercise Capacity:  Lexiscan with no exercise. BP Response:  Normal blood pressure response. Clinical Symptoms:  Typical symptoms with Lexiscan ECG Impression:  No significant ST segment change suggestive of ischemia. Comparison with Prior Nuclear Study: No images to compare  Overall Impression:  Low risk stress nuclear study With no areas of significant ischemia identified. Study is limited/sensitivity reduced secondary to arm position at patient's side during  study..  LV Ejection Fraction: 60%.  LV Wall Motion:  NL LV Function; NL Wall Motion  Candee Furbish, MD

## 2014-08-17 NOTE — Progress Notes (Signed)
Dr. Royce Macadamia reviewed cardiologist notes.  OK for surgery.  No orders given.

## 2014-08-18 ENCOUNTER — Telehealth: Payer: Self-pay | Admitting: Internal Medicine

## 2014-08-18 ENCOUNTER — Other Ambulatory Visit: Payer: Self-pay | Admitting: Obstetrics and Gynecology

## 2014-08-18 NOTE — Progress Notes (Signed)
69 y.o. yo complains of  RLQ pain and persisitent ovarian cysts.  Pt first seen in May 2014 for 2.4 cm cyst on R ovary.  Since then she has had multiple [place] to investigate cysts.  RO comes and goes at 2-2.4 cma dn LO appears at times to have 1 cm cyst.  In addition to this, her pain in pelvis has persisted and she is worried about possibility of cancer.  Her ovary on right does still feel as big as 4 cm.  CA 125 this past summer was only 9.  AFter option of continued waiting was d/w pt she is very eager to proceed with BSO.  Past Medical History  Diagnosis Date  . Chronic low back pain     followed by Dr Hardin Negus pain mgt  . Hyperlipemia   . Tobacco abuse   . Hypothyroidism   . History of cardiac arrhythmia     cardiologist- Traci Turner  . Esophageal stricture   . Thyroid nodule   . Colon polyp   . PONV (postoperative nausea and vomiting)     Pt reports symptoms are the result of gallbladder and cholecystectomy, not anesthesia  . Heart murmur     "slight; not on RX" (06/24/2013)  . Sleep apnea 1990's    "tested; tried mask; couldn't stand it; told me as long as I slept on my side I'd be ok" (06/24/2013)  . Osteoarthritis of left knee     advanced  . Arthritis     "shoulders; wrist; probably spine" (06/24/2013)  . GERD (gastroesophageal reflux disease)   . Constipation   . Hypertension   . PVC's (premature ventricular contractions)    Past Surgical History  Procedure Laterality Date  . Lumbar fusion      and rods  . Cesarean section  1972  . Ankle surgery Left 1995    "tendon repair" (06/24/2013)  . Infusion pump implantation  1990's    "implantablet morphine pump; took it out w/in 11 months  . Knee arthroscopy Left 1991; ~ 1993  . Elbow surgery  1990's  . Thyroidectomy  2012  . Cervical spine surgery  2012  . Foot surgery Right 2012    SPUR REMOVED  . Shoulder arthroscopy Left 09/2011  . Lumbar laminectomy/decompression microdiscectomy N/A 11/28/2012    Procedure:  DECOMPRESSIVE LUMBAR LAMINECTOMY LEVEL 1;  Surgeon: Elaina Hoops, MD;  Location: [facility];  Service: Neurosurgery;  Laterality: N/A;  DECOMPRESSIVE LUMBAR LAMINECTOMY LEVEL 1  . Back surgery      "think today was my 8th back OR" (06/24/2013)  . Cholecystectomy N/A 04/16/2013    Procedure: LAPAROSCOPIC CHOLECYSTECTOMY ;  Surgeon: Imogene Burn. Georgette Dover, MD;  Location: [facility];  Service: General;  Laterality: N/A;  . Laparoscopic lysis of adhesions N/A 04/16/2013    Procedure: LAPAROSCOPIC LYSIS OF ADHESIONS;  Surgeon: Imogene Burn. Tsuei, MD;  Location: [facility];  Service: General;  Laterality: N/A;  . Posterior fusion lumbar spine  06/24/2013  . Abdominal hysterectomy  1988    "partial" (06/24/2013)  . Orif distal radius fracture Left 12/30/2013    dr Caralyn Guile  . Orif wrist fracture Left 12/30/2013    Procedure: OPEN REDUCTION INTERNAL FIXATION (ORIF) LEFT WRIST FRACTURE AND REPAIR AS INDICATED;  Surgeon: Linna Hoff, MD;  Location: [facility];  Service: Orthopedics;  Laterality: Left;    History   Social History  . Marital Status: Divorced    Spouse Name: N/A  . Number of Children: N/A  .  Years of Education: N/A   Occupational History  . Not on file.   Social History Main Topics  . Smoking status: Current Every Day Smoker -- 0.50 packs/day for 35 years    Types: Cigarettes  . Smokeless tobacco: Never Used  . Alcohol Use: No  . Drug Use: No  . Sexual Activity: Not Currently   Other Topics Concern  . Not on file   Social History Narrative   Divorced   Current Smoker  1 ppd -  20 yrs      Alcohol use-no       [employer] - laid off     No current facility-administered medications on file prior to encounter.   Current Outpatient Prescriptions on File Prior to Encounter  Medication Sig Dispense Refill  . bisacodyl (DULCOLAX) 5 MG EC tablet Take 5-10 mg by mouth at bedtime as needed for mild constipation (depending on amount of pain medication taken).  30 tablet   .  carisoprodol (SOMA) 350 MG tablet Take 350 mg by mouth 3 (three) times daily. For muscle spasms    . Cholecalciferol (VITAMIN D3) 5000 UNITS TABS Take 5,000 Units by mouth daily.     . clonazePAM (KLONOPIN) 1 MG tablet Take 1 tablet (1 mg total) by mouth at bedtime. 30 tablet 3  . Cyanocobalamin (VITAMIN B-12) 2500 MCG SUBL Place 5,000 mcg under the tongue daily.     Marland Kitchen estradiol (ESTRACE) 0.5 MG tablet Take 0.5 mg by mouth at bedtime.     Marland Kitchen glucosamine-chondroitin 500-400 MG tablet Take 2 tablets by mouth daily.    Marland Kitchen HYDROcodone-acetaminophen (NORCO) 10-325 MG per tablet Take 1 tablet by mouth every 4 (four) hours. For pain    . levothyroxine (SYNTHROID, LEVOTHROID) 88 MCG tablet Take 1 tablet (88 mcg total) by mouth daily before breakfast. 90 tablet 1  . simvastatin (ZOCOR) 20 MG tablet Take 1 tablet (20 mg total) by mouth at bedtime. 90 tablet 1  . triamcinolone cream (KENALOG) 0.1 % Apply 1 application topically daily.     . vitamin C (ASCORBIC ACID) 500 MG tablet Take 1 tablet (500 mg total) by mouth daily. 50 tablet 0    Allergies  Allergen Reactions  . Morphine And Related Swelling  . Amitriptyline Other (See Comments)    Leg swelling  . Gabapentin Itching    neurontin  . Triamterene Itching and Rash    @VITALS2 @  Lungs: clear to ascultation Cor:  RRR Abdomen:  soft, nontender, nondistended. Ex:  no cords, erythema Pelvic:  Vulva: no masses, atrophy, or lesions. Vagina: no tenderness, erythema, cystocele, rectocele, abnormal vaginal discharge, or vesicle(s) or ulcers. Cervix: absent; Pap smear was not done.Marland Kitchen Uterus: absent. Bladder/Urethra: no urethral discharge or mass and normal meatus, bladder non distended, and Urethra well supported. Adnexa/Parametria: no parametrial tenderness or mass, no adnexal tenderness, and ovarian mass (L ovary feels 4-5 cm? vs stool?).  A:  Pt strongly desires definitive therapy for ovarian cysts and RLQ pain.  For Robotic BSO.  Pt's recent Stress  test was normal.   P: All risks, benefits and alternatives d/w patient and she desires to proceed.  Patient has undergone a modified bowel prep and will receive preop antibiotics and SCDs during the operation.     Merritt Mccravy A

## 2014-08-18 NOTE — Telephone Encounter (Signed)
Pt request refill of the following: clonazePAM (KLONOPIN) 1 MG tablet   Pt said she is having surgery on Friday and need this med today    Phamacy: [facility]

## 2014-08-18 NOTE — Telephone Encounter (Signed)
Ok to RF x 3 

## 2014-08-19 MED ORDER — CLONAZEPAM 1 MG PO TABS: 1.0000 mg | ORAL_TABLET | Freq: Every day | ORAL | Status: DC

## 2014-08-19 NOTE — Telephone Encounter (Signed)
Rx called in to pharmacy. 

## 2014-08-20 ENCOUNTER — Ambulatory Visit (HOSPITAL_COMMUNITY): Payer: Medicare Other | Admitting: Anesthesiology

## 2014-08-20 ENCOUNTER — Encounter (HOSPITAL_COMMUNITY): Payer: Self-pay | Admitting: *Deleted

## 2014-08-20 ENCOUNTER — Ambulatory Visit (HOSPITAL_COMMUNITY)
Admission: RE | Admit: 2014-08-20 | Discharge: 2014-08-20 | Disposition: A | Discharge status: Home / Self Care | Payer: Medicare Other | Source: Ambulatory Visit | Attending: Obstetrics and Gynecology | Admitting: Obstetrics and Gynecology

## 2014-08-20 ENCOUNTER — Encounter (HOSPITAL_COMMUNITY)
Admission: RE | Disposition: A | Discharge status: Home / Self Care | Payer: Self-pay | Source: Ambulatory Visit | Attending: Obstetrics and Gynecology

## 2014-08-20 DIAGNOSIS — I10 Essential (primary) hypertension: Secondary | ICD-10-CM | POA: Insufficient documentation

## 2014-08-20 DIAGNOSIS — Z888 Allergy status to other drugs, medicaments and biological substances status: Secondary | ICD-10-CM | POA: Diagnosis not present

## 2014-08-20 DIAGNOSIS — M1712 Unilateral primary osteoarthritis, left knee: Secondary | ICD-10-CM | POA: Insufficient documentation

## 2014-08-20 DIAGNOSIS — R1031 Right lower quadrant pain: Secondary | ICD-10-CM | POA: Diagnosis present

## 2014-08-20 DIAGNOSIS — Z885 Allergy status to narcotic agent status: Secondary | ICD-10-CM | POA: Insufficient documentation

## 2014-08-20 DIAGNOSIS — Z86010 Personal history of colonic polyps: Secondary | ICD-10-CM | POA: Insufficient documentation

## 2014-08-20 DIAGNOSIS — N8320 Unspecified ovarian cysts: Secondary | ICD-10-CM | POA: Diagnosis not present

## 2014-08-20 DIAGNOSIS — G4733 Obstructive sleep apnea (adult) (pediatric): Secondary | ICD-10-CM | POA: Insufficient documentation

## 2014-08-20 DIAGNOSIS — K219 Gastro-esophageal reflux disease without esophagitis: Secondary | ICD-10-CM | POA: Insufficient documentation

## 2014-08-20 DIAGNOSIS — F17210 Nicotine dependence, cigarettes, uncomplicated: Secondary | ICD-10-CM | POA: Diagnosis not present

## 2014-08-20 DIAGNOSIS — E785 Hyperlipidemia, unspecified: Secondary | ICD-10-CM | POA: Diagnosis not present

## 2014-08-20 DIAGNOSIS — M545 Low back pain: Secondary | ICD-10-CM | POA: Diagnosis not present

## 2014-08-20 DIAGNOSIS — G8929 Other chronic pain: Secondary | ICD-10-CM | POA: Diagnosis not present

## 2014-08-20 HISTORY — PX: ROBOTIC ASSISTED SALPINGO OOPHERECTOMY: SHX6082

## 2014-08-20 SURGERY — ROBOTIC ASSISTED SALPINGO OOPHORECTOMY
Anesthesia: General | Site: Abdomen | Laterality: Bilateral

## 2014-08-20 MED ORDER — ROCURONIUM BROMIDE 100 MG/10ML IV SOLN
INTRAVENOUS | Status: AC
  Filled 2014-08-20: qty 1

## 2014-08-20 MED ORDER — FENTANYL CITRATE 0.05 MG/ML IJ SOLN
INTRAMUSCULAR | Status: AC
  Filled 2014-08-20: qty 2

## 2014-08-20 MED ORDER — LACTATED RINGERS IR SOLN
Status: DC | PRN
  Administered 2014-08-20: 3000 mL

## 2014-08-20 MED ORDER — SCOPOLAMINE 1 MG/3DAYS TD PT72
1.0000 | MEDICATED_PATCH | Freq: Once | TRANSDERMAL | Status: DC
  Administered 2014-08-20: 1.5 mg via TRANSDERMAL

## 2014-08-20 MED ORDER — DEXAMETHASONE SODIUM PHOSPHATE 4 MG/ML IJ SOLN
INTRAMUSCULAR | Status: AC
  Filled 2014-08-20: qty 1

## 2014-08-20 MED ORDER — ONDANSETRON HCL 4 MG/2ML IJ SOLN
INTRAMUSCULAR | Status: DC | PRN
  Administered 2014-08-20: 4 mg via INTRAVENOUS

## 2014-08-20 MED ORDER — PROMETHAZINE HCL 25 MG/ML IJ SOLN: 6.2500 mg | INTRAMUSCULAR | Status: DC | PRN

## 2014-08-20 MED ORDER — HYDROCODONE-ACETAMINOPHEN 5-325 MG PO TABS
2.0000 | ORAL_TABLET | Freq: Once | ORAL | Status: AC
  Administered 2014-08-20: 2 via ORAL

## 2014-08-20 MED ORDER — FENTANYL CITRATE 0.05 MG/ML IJ SOLN
25.0000 ug | INTRAMUSCULAR | Status: DC | PRN
  Administered 2014-08-20 (×3): 50 ug via INTRAVENOUS

## 2014-08-20 MED ORDER — KETOROLAC TROMETHAMINE 30 MG/ML IJ SOLN
30.0000 mg | Freq: Once | INTRAMUSCULAR | Status: AC | PRN
  Administered 2014-08-20: 30 mg via INTRAVENOUS

## 2014-08-20 MED ORDER — LIDOCAINE HCL (CARDIAC) 20 MG/ML IV SOLN
INTRAVENOUS | Status: AC
  Filled 2014-08-20: qty 5

## 2014-08-20 MED ORDER — MIDAZOLAM HCL 2 MG/2ML IJ SOLN: 0.5000 mg | Freq: Once | INTRAMUSCULAR | Status: DC | PRN

## 2014-08-20 MED ORDER — GLYCOPYRROLATE 0.2 MG/ML IJ SOLN
INTRAMUSCULAR | Status: DC | PRN
  Administered 2014-08-20: 0.6 mg via INTRAVENOUS

## 2014-08-20 MED ORDER — ACETAMINOPHEN 160 MG/5ML PO SOLN: 325.0000 mg | ORAL | Status: DC | PRN

## 2014-08-20 MED ORDER — HYDROCODONE-ACETAMINOPHEN 5-325 MG PO TABS
ORAL_TABLET | ORAL | Status: AC
  Filled 2014-08-20: qty 2

## 2014-08-20 MED ORDER — NEOSTIGMINE METHYLSULFATE 10 MG/10ML IV SOLN
INTRAVENOUS | Status: AC
  Filled 2014-08-20: qty 1

## 2014-08-20 MED ORDER — MIDAZOLAM HCL 2 MG/2ML IJ SOLN
INTRAMUSCULAR | Status: DC | PRN
  Administered 2014-08-20: 2 mg via INTRAVENOUS

## 2014-08-20 MED ORDER — SCOPOLAMINE 1 MG/3DAYS TD PT72
MEDICATED_PATCH | TRANSDERMAL | Status: AC
  Filled 2014-08-20: qty 1

## 2014-08-20 MED ORDER — DEXAMETHASONE SODIUM PHOSPHATE 10 MG/ML IJ SOLN
INTRAMUSCULAR | Status: DC | PRN
Start: 2014-08-20 — End: 2014-08-20
  Administered 2014-08-20: 4 mg via INTRAVENOUS

## 2014-08-20 MED ORDER — BUPIVACAINE LIPOSOME 1.3 % IJ SUSP
20.0000 mL | Freq: Once | INTRAMUSCULAR | Status: AC
  Administered 2014-08-20: 40 mL
  Filled 2014-08-20: qty 20

## 2014-08-20 MED ORDER — NEOSTIGMINE METHYLSULFATE 10 MG/10ML IV SOLN
INTRAVENOUS | Status: DC | PRN
  Administered 2014-08-20: 4 mg via INTRAVENOUS

## 2014-08-20 MED ORDER — ACETAMINOPHEN 325 MG PO TABS: 325.0000 mg | ORAL_TABLET | ORAL | Status: DC | PRN

## 2014-08-20 MED ORDER — KETOROLAC TROMETHAMINE 30 MG/ML IJ SOLN
INTRAMUSCULAR | Status: AC
  Filled 2014-08-20: qty 1

## 2014-08-20 MED ORDER — LIDOCAINE HCL (CARDIAC) 20 MG/ML IV SOLN
INTRAVENOUS | Status: DC | PRN
  Administered 2014-08-20: 100 mg via INTRAVENOUS

## 2014-08-20 MED ORDER — PHENYLEPHRINE HCL 10 MG/ML IJ SOLN
INTRAMUSCULAR | Status: DC | PRN
  Administered 2014-08-20: 80 ug via INTRAVENOUS

## 2014-08-20 MED ORDER — ONDANSETRON HCL 4 MG/2ML IJ SOLN
INTRAMUSCULAR | Status: AC
  Filled 2014-08-20: qty 2

## 2014-08-20 MED ORDER — PROPOFOL 10 MG/ML IV BOLUS
INTRAVENOUS | Status: AC
  Filled 2014-08-20: qty 20

## 2014-08-20 MED ORDER — LACTATED RINGERS IV SOLN
INTRAVENOUS | Status: DC
  Administered 2014-08-20: 07:00:00 via INTRAVENOUS
  Administered 2014-08-20: 1000 mL via INTRAVENOUS

## 2014-08-20 MED ORDER — GLYCOPYRROLATE 0.2 MG/ML IJ SOLN
INTRAMUSCULAR | Status: AC
  Filled 2014-08-20: qty 3

## 2014-08-20 MED ORDER — PROPOFOL 10 MG/ML IV BOLUS
INTRAVENOUS | Status: DC | PRN
  Administered 2014-08-20: 150 mg via INTRAVENOUS

## 2014-08-20 MED ORDER — MEPERIDINE HCL 25 MG/ML IJ SOLN: 6.2500 mg | INTRAMUSCULAR | Status: DC | PRN

## 2014-08-20 MED ORDER — FENTANYL CITRATE 0.05 MG/ML IJ SOLN
INTRAMUSCULAR | Status: DC | PRN
  Administered 2014-08-20: 100 ug via INTRAVENOUS
  Administered 2014-08-20: 50 ug via INTRAVENOUS
  Administered 2014-08-20: 100 ug via INTRAVENOUS
  Administered 2014-08-20 (×2): 50 ug via INTRAVENOUS

## 2014-08-20 MED ORDER — MIDAZOLAM HCL 2 MG/2ML IJ SOLN
INTRAMUSCULAR | Status: AC
  Filled 2014-08-20: qty 2

## 2014-08-20 MED ORDER — STERILE WATER FOR IRRIGATION IR SOLN
Status: DC | PRN
  Administered 2014-08-20: 1000 mL

## 2014-08-20 MED ORDER — HYDROCODONE-ACETAMINOPHEN 10-325 MG PO TABS: 1.0000 | ORAL_TABLET | ORAL | Status: DC

## 2014-08-20 MED ORDER — FENTANYL CITRATE 0.05 MG/ML IJ SOLN
INTRAMUSCULAR | Status: AC
  Filled 2014-08-20: qty 5

## 2014-08-20 MED ORDER — ROCURONIUM BROMIDE 100 MG/10ML IV SOLN
INTRAVENOUS | Status: DC | PRN
  Administered 2014-08-20: 40 mg via INTRAVENOUS
  Administered 2014-08-20: 10 mg via INTRAVENOUS

## 2014-08-20 MED ORDER — SODIUM CHLORIDE 0.9 % IJ SOLN
INTRAMUSCULAR | Status: AC
  Filled 2014-08-20: qty 50

## 2014-08-20 SURGICAL SUPPLY — 54 items
BARRIER ADHS 3X4 INTERCEED (GAUZE/BANDAGES/DRESSINGS) ×3 IMPLANT
BENZOIN TINCTURE PRP APPL 2/3 (GAUZE/BANDAGES/DRESSINGS) ×3 IMPLANT
CHLORAPREP W/TINT 26ML (MISCELLANEOUS) ×3 IMPLANT
CLOSURE WOUND 1/2 X4 (GAUZE/BANDAGES/DRESSINGS) ×1
CLOTH BEACON ORANGE TIMEOUT ST (SAFETY) ×3 IMPLANT
CONT PATH 16OZ SNAP LID 3702 (MISCELLANEOUS) ×3 IMPLANT
COVER BACK TABLE 60X90IN (DRAPES) ×6 IMPLANT
COVER TIP SHEARS 8 DVNC (MISCELLANEOUS) ×1 IMPLANT
COVER TIP SHEARS 8MM DA VINCI (MISCELLANEOUS) ×2
DECANTER SPIKE VIAL GLASS SM (MISCELLANEOUS) ×3 IMPLANT
DRAPE WARM FLUID 44X44 (DRAPE) ×3 IMPLANT
DRSG COVADERM PLUS 2X2 (GAUZE/BANDAGES/DRESSINGS) ×3 IMPLANT
DRSG OPSITE POSTOP 3X4 (GAUZE/BANDAGES/DRESSINGS) ×3 IMPLANT
ELECT REM PT RETURN 9FT ADLT (ELECTROSURGICAL) ×3
ELECTRODE REM PT RTRN 9FT ADLT (ELECTROSURGICAL) ×1 IMPLANT
GAUZE VASELINE 3X9 (GAUZE/BANDAGES/DRESSINGS) IMPLANT
GLOVE BIO SURGEON STRL SZ7 (GLOVE) ×6 IMPLANT
GLOVE ECLIPSE 6.5 STRL STRAW (GLOVE) ×9 IMPLANT
KIT ACCESSORY DA VINCI DISP (KITS) ×2
KIT ACCESSORY DVNC DISP (KITS) ×1 IMPLANT
LEGGING LITHOTOMY PAIR STRL (DRAPES) ×3 IMPLANT
LIQUID BAND (GAUZE/BANDAGES/DRESSINGS) ×3 IMPLANT
MANIPULATOR UTERINE 4.5 ZUMI (MISCELLANEOUS) IMPLANT
NEEDLE INSUFFLATION 120MM (ENDOMECHANICALS) ×3 IMPLANT
NS IRRIG 1000ML POUR BTL (IV SOLUTION) ×9 IMPLANT
OCCLUDER COLPOPNEUMO (BALLOONS) ×3 IMPLANT
PACK ROBOT WH (CUSTOM PROCEDURE TRAY) ×3 IMPLANT
PACK ROBOTIC GOWN (GOWN DISPOSABLE) ×3 IMPLANT
PAD POSITIONER PINK NONSTERILE (MISCELLANEOUS) ×3 IMPLANT
PAD PREP 24X48 CUFFED NSTRL (MISCELLANEOUS) ×6 IMPLANT
SET CYSTO W/LG BORE CLAMP LF (SET/KITS/TRAYS/PACK) IMPLANT
SET IRRIG TUBING LAPAROSCOPIC (IRRIGATION / IRRIGATOR) ×3 IMPLANT
SET TRI-LUMEN FLTR TB AIRSEAL (TUBING) ×3 IMPLANT
STRIP CLOSURE SKIN 1/2X4 (GAUZE/BANDAGES/DRESSINGS) ×2 IMPLANT
SUT VIC AB 0 CT1 27 (SUTURE) ×10
SUT VIC AB 0 CT1 27XBRD ANBCTR (SUTURE) ×5 IMPLANT
SUT VIC AB 2-0 CT2 27 (SUTURE) ×6 IMPLANT
SUT VICRYL 0 UR6 27IN ABS (SUTURE) ×6 IMPLANT
SUT VICRYL RAPIDE 3 0 (SUTURE) ×6 IMPLANT
SYR 50ML LL SCALE MARK (SYRINGE) ×3 IMPLANT
SYSTEM CONVERTIBLE TROCAR (TROCAR) IMPLANT
TIP UTERINE 5.1X6CM LAV DISP (MISCELLANEOUS) IMPLANT
TIP UTERINE 6.7X10CM GRN DISP (MISCELLANEOUS) IMPLANT
TIP UTERINE 6.7X6CM WHT DISP (MISCELLANEOUS) IMPLANT
TIP UTERINE 6.7X8CM BLUE DISP (MISCELLANEOUS) IMPLANT
TOWEL OR 17X24 6PK STRL BLUE (TOWEL DISPOSABLE) ×6 IMPLANT
TRAY FOLEY CATH 14FR (SET/KITS/TRAYS/PACK) ×3 IMPLANT
TROCAR DILATING TIP 12MM 150MM (ENDOMECHANICALS) ×3 IMPLANT
TROCAR DISP BLADELESS 8 DVNC (TROCAR) ×1 IMPLANT
TROCAR DISP BLADELESS 8MM (TROCAR) ×2
TROCAR OPTI TIP 12M 100M (ENDOMECHANICALS) IMPLANT
TROCAR PORT AIRSEAL 5X120 (TROCAR) ×3 IMPLANT
TROCAR XCEL 12X100 BLDLESS (ENDOMECHANICALS) ×3 IMPLANT
TROCAR XCEL NON-BLD 5MMX100MML (ENDOMECHANICALS) ×3 IMPLANT

## 2014-08-20 NOTE — Transfer of Care (Signed)
Immediate Anesthesia Transfer of Care Note  Patient: Joan [name]  Procedure(s) Performed: Procedure(s): ROBOTIC ASSISTED SALPINGO OOPHORECTOMY (Bilateral)  Patient Location: PACU  Anesthesia Type:General  Level of Consciousness: awake, alert  and oriented  Airway & Oxygen Therapy: Patient Spontanous Breathing and Patient connected to nasal cannula oxygen  Post-op Assessment: Report given to RN and Post -op Vital signs reviewed and stable  Post vital signs: Reviewed and stable  Last Vitals:  Filed Vitals:   08/20/14 0623  BP: 106/61  Pulse: 70  Temp: 36.6 C  Resp: 20    Complications: No apparent anesthesia complications

## 2014-08-20 NOTE — Anesthesia Preprocedure Evaluation (Addendum)
Anesthesia Evaluation  Patient identified by MRN, date of birth, ID band Patient awake    Reviewed: Allergy & Precautions, H&P , Patient's Chart, lab work & pertinent test results, reviewed documented beta blocker date and time   History of Anesthesia Complications (+) PONV and history of anesthetic complications  Airway Mallampati: III  TM Distance: >3 FB Neck ROM: limited  Mouth opening: Limited Mouth Opening  Dental   Pulmonary sleep apnea , Current Smoker,  breath sounds clear to auscultation        Cardiovascular Exercise Tolerance: Good hypertension, + Valvular Problems/Murmurs Rhythm:regular Rate:Normal     Neuro/Psych PSYCHIATRIC DISORDERS    GI/Hepatic GERD-  ,  Endo/Other  Hypothyroidism   Renal/GU      Musculoskeletal  (+) Arthritis -,   Abdominal   Peds  Hematology  (+) anemia ,   Anesthesia Other Findings Esophageal stricture OSA--- can't stand mask  Reproductive/Obstetrics                           Anesthesia Physical Anesthesia Plan  ASA: III  Anesthesia Plan: General ETT   Post-op Pain Management:    Induction:   Airway Management Planned: Video Laryngoscope Planned  Additional Equipment:   Intra-op Plan:   Post-operative Plan:   Informed Consent: I have reviewed the patients History and Physical, chart, labs and discussed the procedure including the risks, benefits and alternatives for the proposed anesthesia with the patient or authorized representative who has indicated his/her understanding and acceptance.   Dental Advisory Given  Plan Discussed with: CRNA and Surgeon  Anesthesia Plan Comments:       patient with mouth cuts after last several anesthetics.  Current back pain significant... Position awake Anesthesia Quick Evaluation

## 2014-08-20 NOTE — Anesthesia Postprocedure Evaluation (Signed)
  Anesthesia Post-op Note  Patient: Joan [name]  Procedure(s) Performed: Procedure(s): ROBOTIC ASSISTED SALPINGO OOPHORECTOMY (Bilateral) Patient is awake and responsive. Pain and nausea are reasonably well controlled. Vital signs are stable and clinically acceptable. Oxygen saturation is clinically acceptable. There are no apparent anesthetic complications at this time. Patient is ready for discharge.

## 2014-08-20 NOTE — Anesthesia Procedure Notes (Signed)
Procedure Name: Intubation Date/Time: 08/20/2014 7:33 AM Performed by: Edynn Gillock, Sheron Nightingale Pre-anesthesia Checklist: Patient identified, Timeout performed, Emergency Drugs available, Suction available and Patient being monitored Patient Re-evaluated:Patient Re-evaluated prior to inductionOxygen Delivery Method: Circle system utilized Preoxygenation: Pre-oxygenation with 100% oxygen Intubation Type: IV induction Ventilation: Mask ventilation without difficulty Laryngoscope Size: Glidescope and 3 Grade View: Grade II Tube type: Oral Tube size: 7.0 mm Number of attempts: 1 Placement Confirmation: ETT inserted through vocal cords under direct vision,  positive ETCO2 and breath sounds checked- equal and bilateral Secured at: 22 cm Dental Injury: Teeth and Oropharynx as per pre-operative assessment

## 2014-08-20 NOTE — Brief Op Note (Signed)
08/20/2014  9:10 AM  PATIENT:  Joan [name]  69 y.o. female  PRE-OPERATIVE DIAGNOSIS:  ovarian cyst  POST-OPERATIVE DIAGNOSIS:  ovarian cyst  PROCEDURE:  Procedure(s): ROBOTIC ASSISTED SALPINGO OOPHORECTOMY (Bilateral)  SURGEON:  Surgeon(s) and Role:    * Jerelyn Charles, MD - Assisting    * Daria Pastures, MD - Primary  ASSISTANTS: Dr. Carlis Abbott   ANESTHESIA:   general  EBL:  Total I/O In: 500 [I.V.:500] Out: 100 [Blood:100]  LOCAL MEDICATIONS USED:  OTHER Exparel  SPECIMEN:  Source of Specimen:  bilateral tubes and ovaries  DISPOSITION OF SPECIMEN:  PATHOLOGY  COUNTS:  YES  TOURNIQUET:  * No tourniquets in log *  DICTATION: .Note written in EPIC  PLAN OF CARE: Discharge to home after PACU  PATIENT DISPOSITION:  PACU - hemodynamically stable.   Delay start of Pharmacological VTE agent (>24hrs) due to surgical blood loss or risk of bleeding: not applicable  Complications:  None.  Findings:  3 cm R Ovary with adhesions to peritoneum and omentum.  Some adhesions of sigmoid to peritoneum.  Liver edge and cul de sac were otherwise normal.  The R ureter was seen well out of all fields of dissection; the left was checked with cysto and was functioning normally.    Meds: Interceed  Technique:  After adequate general anesthesia was achieved, the patient was prepped and draped in sterile fashion.  The speculum was placed into the vagina and the uterine manipulator placed in the cervix. The speculum was removed and a catheter placed to drain the bladder during the surgery.  Attention was turned to the abdomen and a  2 cm incision was made above the umbilicus.  The veress needle passed into the abdomen without aspiration of bowel contents or blood.  The 12 mm trocar for the camera port was introduced after insuflatation and the above findings noted.  Two 8.5 mm trocars were introduced 10 cm either side of the camera port under direct visualization.  The PK forceps were  introduced on arm 2 and the Hot Shears on arm 1.      The adhesions of the lower sigmoid were removed from the ovary with cautery and the IP ligament identified.  The IP was isolated by incising the peritoneum above and parallel to it with the hot shears.  The IP was then cauterized in two places and incised with the scissors.  The ureter was seen well below th field of dissection.  The R ovary was then removed with careful cautery on the peritoneum that was adhesed to it, careful to be well away from the structures underneath.  The ovary was then placed in the cul de sac.  Hemostasis was assured with the insufflation down.  The left ovary was identified under the sigmoid bowel and dissection was performed to tease the bowel off the ovary with mostly blunt dissection.  The ovary was densely adhesed to the peritoneum underneath and no real IP was able to be seen.  Careful sharp and blunt dissection was used to tease the ovary off the peritoneum - the ureter was not seen but dissection stayed very high in the pelvis. The ovary and tube of the left were placed in the cul de sac.  Cysto was performed and once excellent spill was seen bilaterally, the Robot was then undocked.  The 8.5 mm scope was introduced and each ovary and tube were placed inside separate endobags.  The bag was then brought through the umbilical incision  and opened to the outside.  The R tube was removed with an allis clamp and the Rovary decompressed with a scalpel.  Fluid, ovaries and tubes were all successfully removed from the abdomen without any intraperitoneal spill and sent to pathology.  A piece of interceed was then placed over the peritoneal defect.       All instruments were removed and the abdomen desuflated.  The 12 mm trocar site fascia was closed with a figure of eight stitch of 2-Vicryl.  All skin incisions were closed with subcuticular stitches and Dermabond.  All instruments were withdrawn from the vagina.  Pt tolerated the  procedure well and was returned to the recovery room in stable condition.    Wynelle Dreier A

## 2014-08-20 NOTE — Progress Notes (Signed)
There has been no change in the patients history, status or exam since the history and physical.  Filed Vitals:   08/20/14 0623  BP: 106/61  Pulse: 70  Temp: 97.9 F (36.6 C)  TempSrc: Oral  Resp: 20  SpO2: 98%    Lab Results  Component Value Date   WBC 5.4 08/11/2014   HGB 13.5 08/11/2014   HCT 40.0 08/11/2014   MCV 92.0 08/11/2014   PLT 203 08/11/2014    Joan [name] A

## 2014-08-20 NOTE — Op Note (Addendum)
08/20/2014  9:10 AM  PATIENT:  Joan [name]  69 y.o. female  PRE-OPERATIVE DIAGNOSIS:  ovarian cyst  POST-OPERATIVE DIAGNOSIS:  ovarian cyst  PROCEDURE:  Procedure(s): ROBOTIC ASSISTED SALPINGO OOPHORECTOMY (Bilateral) with cysto  SURGEON:  Surgeon(s) and Role:    * Jerelyn Charles, MD - Assisting    * Daria Pastures, MD - Primary  ASSISTANTS: Dr. Carlis Abbott   ANESTHESIA:   general  EBL:  Total I/O In: 500 [I.V.:500] Out: 100 [Blood:100]  LOCAL MEDICATIONS USED:  OTHER Exparel  SPECIMEN:  Source of Specimen:  bilateral tubes and ovaries  DISPOSITION OF SPECIMEN:  PATHOLOGY  COUNTS:  YES  TOURNIQUET:  * No tourniquets in log *  DICTATION: .Note written in EPIC  PLAN OF CARE: Discharge to home after PACU  PATIENT DISPOSITION:  PACU - hemodynamically stable.   Delay start of Pharmacological VTE agent (>24hrs) due to surgical blood loss or risk of bleeding: not applicable  Complications:  None.  Findings:  3 cm R Ovary with adhesions to peritoneum and omentum.  Some adhesions of sigmoid to peritoneum.  Liver edge and cul de sac were otherwise normal.  The R ureter was seen well out of all fields of dissection; the left was checked with cysto and was functioning normally.    Meds: Interceed  Technique:  After adequate general anesthesia was achieved, the patient was prepped and draped in sterile fashion.  The speculum was placed into the vagina and the uterine manipulator placed in the cervix. The speculum was removed and a catheter placed to drain the bladder during the surgery.  Attention was turned to the abdomen and a  2 cm incision was made above the umbilicus.  The veress needle passed into the abdomen without aspiration of bowel contents or blood.  The 12 mm trocar for the camera port was introduced after insuflatation and the above findings noted.  Two 8.5 mm trocars were introduced 10 cm either side of the camera port under direct visualization.  The PK forceps  were introduced on arm 2 and the Hot Shears on arm 1.      The adhesions of the lower sigmoid were removed from the ovary with cautery and the IP ligament identified.  The IP was isolated by incising the peritoneum above and parallel to it with the hot shears.  The IP was then cauterized in two places and incised with the scissors.  The ureter was seen well below th field of dissection.  The R ovary was then removed with careful cautery on the peritoneum that was adhesed to it, careful to be well away from the structures underneath.  The ovary was then placed in the cul de sac.  Hemostasis was assured with the insufflation down.  The left ovary was identified under the sigmoid bowel and dissection was performed to tease the bowel off the ovary with mostly blunt dissection.  The ovary was densely adhesed to the peritoneum underneath and no real IP was able to be seen.  Careful sharp and blunt dissection was used to tease the ovary off the peritoneum - the ureter was not seen but dissection stayed very high in the pelvis. The ovary and tube of the left were placed in the cul de sac.  Cysto was performed and once excellent spill was seen bilaterally, the Robot was then undocked.  The 8.5 mm scope was introduced and each ovary and tube were placed inside separate endobags.  The bag was then brought through the  umbilical incision and opened to the outside.  The R tube was removed with an allis clamp and the Rovary decompressed with a scalpel.  Fluid, ovaries and tubes were all successfully removed from the abdomen without any intraperitoneal spill and sent to pathology.  A piece of interceed was then placed over the peritoneal defect.       All instruments were removed and the abdomen desuflated.  The 12 mm trocar site fascia was closed with a figure of eight stitch of 2-Vicryl.  All skin incisions were closed with subcuticular stitches and Dermabond.  All instruments were withdrawn from the vagina.  Pt tolerated  the procedure well and was returned to the recovery room in stable condition.    Kiowa Peifer A

## 2014-08-23 ENCOUNTER — Encounter (HOSPITAL_COMMUNITY): Payer: Self-pay | Admitting: Obstetrics and Gynecology

## 2014-08-27 ENCOUNTER — Other Ambulatory Visit: Payer: Self-pay | Admitting: Internal Medicine

## 2014-08-31 ENCOUNTER — Other Ambulatory Visit (INDEPENDENT_AMBULATORY_CARE_PROVIDER_SITE_OTHER): Payer: Medicare Other

## 2014-08-31 DIAGNOSIS — E785 Hyperlipidemia, unspecified: Secondary | ICD-10-CM

## 2014-08-31 DIAGNOSIS — O10019 Pre-existing essential hypertension complicating pregnancy, unspecified trimester: Secondary | ICD-10-CM

## 2014-08-31 LAB — LIPID PANEL
Cholesterol: 220 mg/dL — ABNORMAL HIGH (ref 0–200)
HDL: 61.1 mg/dL (ref >39.00)
LDL Cholesterol: 119 mg/dL — ABNORMAL HIGH (ref 0–99)
NonHDL: 158.9
Total CHOL/HDL Ratio: 4
Triglycerides: 198 mg/dL — ABNORMAL HIGH (ref 0.0–149.0)
VLDL: 39.6 mg/dL (ref 0.0–40.0)

## 2014-08-31 LAB — HEPATIC FUNCTION PANEL
ALT: 8 U/L (ref 0–35)
AST: 12 U/L (ref 0–37)
Albumin: 3.9 g/dL (ref 3.5–5.2)
Alkaline Phosphatase: 81 U/L (ref 39–117)
Bilirubin, Direct: 0.1 mg/dL (ref 0.0–0.3)
Total Bilirubin: 0.3 mg/dL (ref 0.2–1.2)
Total Protein: 6.4 g/dL (ref 6.0–8.3)

## 2014-08-31 LAB — BASIC METABOLIC PANEL
BUN: 10 mg/dL (ref 6–23)
CO2: 31 mEq/L (ref 19–32)
Calcium: 8.6 mg/dL (ref 8.4–10.5)
Chloride: 103 mEq/L (ref 96–112)
Creatinine, Ser: 0.7 mg/dL (ref 0.40–1.20)
GFR: 87.98 mL/min (ref >60.00)
Glucose, Bld: 100 mg/dL — ABNORMAL HIGH (ref 70–99)
Potassium: 3.9 mEq/L (ref 3.5–5.1)
Sodium: 137 mEq/L (ref 135–145)

## 2014-09-08 ENCOUNTER — Ambulatory Visit (INDEPENDENT_AMBULATORY_CARE_PROVIDER_SITE_OTHER): Payer: Medicare Other | Admitting: Internal Medicine

## 2014-09-08 ENCOUNTER — Encounter: Payer: Self-pay | Admitting: Internal Medicine

## 2014-09-08 VITALS — BP 112/70 | HR 88 | Temp 97.8°F | Ht 60.0 in | Wt 154.0 lb

## 2014-09-08 DIAGNOSIS — I10 Essential (primary) hypertension: Secondary | ICD-10-CM

## 2014-09-08 DIAGNOSIS — Z23 Encounter for immunization: Secondary | ICD-10-CM

## 2014-09-08 DIAGNOSIS — E785 Hyperlipidemia, unspecified: Secondary | ICD-10-CM

## 2014-09-08 DIAGNOSIS — E039 Hypothyroidism, unspecified: Secondary | ICD-10-CM

## 2014-09-08 DIAGNOSIS — Z0000 Encounter for general adult medical examination without abnormal findings: Secondary | ICD-10-CM

## 2014-09-08 NOTE — Progress Notes (Signed)
Subjective:    Patient ID: Joan [name], female    DOB: 1946/02/20, 69 y.o.   MRN: [id number]  HPI  69 year old white female with history of hypertension, hyperlipidemia and hypothyroidism presents for meticulous exam.  Medicare wellness questionnaire in detail. See attached form.  Pertinent social, surgical, and family history reviewed and updated.  Hypertension - stable.  She denies dizziness.   Hyperlipidemia - good medication compliance.  Lipid panel and her current diet reviewed.  Review of Systems  Constitutional: She has been less active.  She is concerned about weight gain Eyes: Negative for visual disturbance.  Respiratory: Negative for cough, chest tightness and shortness of breath.   Cardiovascular: Negative for chest pain.  Genitourinary: Negative for difficulty urinating.  Neurological: Negative for headaches.  Gastrointestinal: Negative for abdominal pain, heartburn melena or hematochezia Psych: Negative for depression or anxiety Endo:  No polyuria or polydypsia        Past Medical History  Diagnosis Date  . Chronic low back pain     followed by Dr Hardin Negus pain mgt  . Hyperlipemia   . Tobacco abuse   . Hypothyroidism   . History of cardiac arrhythmia     cardiologist- Traci Turner  . Esophageal stricture   . Thyroid nodule   . Colon polyp   . PONV (postoperative nausea and vomiting)     Pt reports symptoms are the result of gallbladder and cholecystectomy, not anesthesia  . Heart murmur     "slight; not on RX" (06/24/2013)  . Sleep apnea 1990's    "tested; tried mask; couldn't stand it; told me as long as I slept on my side I'd be ok" (06/24/2013)  . Osteoarthritis of left knee     advanced  . Arthritis     "shoulders; wrist; probably spine" (06/24/2013)  . GERD (gastroesophageal reflux disease)   . Constipation   . Hypertension   . PVC's (premature ventricular contractions)     History   Social History  . Marital Status: Divorced   Spouse Name: N/A  . Number of Children: N/A  . Years of Education: N/A   Occupational History  . Not on file.   Social History Main Topics  . Smoking status: Current Every Day Smoker -- 0.50 packs/day for 35 years    Types: Cigarettes  . Smokeless tobacco: Never Used  . Alcohol Use: No  . Drug Use: No  . Sexual Activity: Not Currently   Other Topics Concern  . Not on file   Social History Narrative   Divorced   Current Smoker  1 ppd -  20 yrs      Alcohol use-no       [employer] - laid off       Physician roster:   Dr. Elta Guadeloupe Philips - pain management   Dr. Philis Pique - GYN   Dr. Saintclair Halsted - Neurosurgery   Dr. Ronnald Ramp - dermatology   Dr. Caralyn Guile - orthopedics   Dr. Fransico Him - cardiology   Dr. Miller-ophthalmology    Past Surgical History  Procedure Laterality Date  . Lumbar fusion      and rods  . Cesarean section  1972  . Ankle surgery Left 1995    "tendon repair" (06/24/2013)  . Infusion pump implantation  1990's    "implantablet morphine pump; took it out w/in 11 months  . Knee arthroscopy Left 1991; ~ 1993  . Elbow surgery  1990's  . Thyroidectomy  2012  . Cervical spine  surgery  2012  . Foot surgery Right 2012    SPUR REMOVED  . Shoulder arthroscopy Left 09/2011  . Lumbar laminectomy/decompression microdiscectomy N/A 11/28/2012    Procedure: DECOMPRESSIVE LUMBAR LAMINECTOMY LEVEL 1;  Surgeon: Elaina Hoops, MD;  Location: [facility];  Service: Neurosurgery;  Laterality: N/A;  DECOMPRESSIVE LUMBAR LAMINECTOMY LEVEL 1  . Back surgery      "think today was my 8th back OR" (06/24/2013)  . Cholecystectomy N/A 04/16/2013    Procedure: LAPAROSCOPIC CHOLECYSTECTOMY ;  Surgeon: Imogene Burn. Georgette Dover, MD;  Location: [facility];  Service: General;  Laterality: N/A;  . Laparoscopic lysis of adhesions N/A 04/16/2013    Procedure: LAPAROSCOPIC LYSIS OF ADHESIONS;  Surgeon: Imogene Burn. Tsuei, MD;  Location: [facility];  Service: General;  Laterality: N/A;  . Posterior fusion  lumbar spine  06/24/2013  . Abdominal hysterectomy  1988    "partial" (06/24/2013)  . Orif distal radius fracture Left 12/30/2013    dr Caralyn Guile  . Orif wrist fracture Left 12/30/2013    Procedure: OPEN REDUCTION INTERNAL FIXATION (ORIF) LEFT WRIST FRACTURE AND REPAIR AS INDICATED;  Surgeon: Linna Hoff, MD;  Location: [facility];  Service: Orthopedics;  Laterality: Left;  . Robotic assisted salpingo oopherectomy Bilateral 08/20/2014    Procedure: ROBOTIC ASSISTED SALPINGO OOPHORECTOMY;  Surgeon: Daria Pastures, MD;  Location: [facility];  Service: Gynecology;  Laterality: Bilateral;    Family History  Problem Relation Age of Onset  . Pancreatic cancer Father     deceased age 49  . Cancer Father   . Diabetes type II Mother     deceased age 4  . Hypertension Mother   . Coronary artery disease Mother   . Diabetes Mother   . Cancer Mother     Thyroid and Skin  . Diabetes Sister   . Hypertension Sister   . Cancer Sister     Breast  . Other Neg Hx     No family history of  colon cancer  . Hypertension Brother     Allergies  Allergen Reactions  . Morphine And Related Swelling  . Amitriptyline Other (See Comments)    Leg swelling  . Gabapentin Itching    neurontin  . Triamterene Itching and Rash    Current Outpatient Prescriptions on File Prior to Visit  Medication Sig Dispense Refill  . bisacodyl (DULCOLAX) 5 MG EC tablet Take 5-10 mg by mouth at bedtime as needed for mild constipation (depending on amount of pain medication taken).  30 tablet   . carisoprodol (SOMA) 350 MG tablet Take 350 mg by mouth 3 (three) times daily. For muscle spasms    . Cholecalciferol (VITAMIN D3) 5000 UNITS TABS Take 5,000 Units by mouth daily.     . clonazePAM (KLONOPIN) 1 MG tablet Take 1 tablet (1 mg total) by mouth at bedtime. 30 tablet 3  . Cyanocobalamin (VITAMIN B-12) 2500 MCG SUBL Place 5,000 mcg under the tongue daily.     Marland Kitchen estradiol (ESTRACE) 0.5 MG tablet Take 0.5 mg by mouth at bedtime.      . furosemide (LASIX) 20 MG tablet TAKE ONE TABLET BY MOUTH ONCE DAILY AS NEEDED FOR FLUID 30 tablet 3  . glucosamine-chondroitin 500-400 MG tablet Take 2 tablets by mouth daily.    . hydrochlorothiazide (HYDRODIURIL) 25 MG tablet Take 1 tablet (25 mg total) by mouth daily. 90 tablet 1  . HYDROcodone-acetaminophen (NORCO) 10-325 MG per tablet Take 1 tablet by mouth every 4 (four) hours. For  pain 30 tablet 0  . levothyroxine (SYNTHROID, LEVOTHROID) 88 MCG tablet Take 1 tablet (88 mcg total) by mouth daily before breakfast. 90 tablet 1  . omeprazole (PRILOSEC) 40 MG capsule TAKE 1 BY MOUTH TWICE DAILY 180 capsule 0  . potassium chloride SA (K-DUR,KLOR-CON) 20 MEQ tablet TAKE 3 TABLETS (60MEQ) BY MOUTH DAILY 270 tablet 0  . simvastatin (ZOCOR) 20 MG tablet Take 1 tablet (20 mg total) by mouth at bedtime. 90 tablet 1  . triamcinolone cream (KENALOG) 0.1 % Apply 1 application topically daily.     . vitamin C (ASCORBIC ACID) 500 MG tablet Take 1 tablet (500 mg total) by mouth daily. 50 tablet 0   No current facility-administered medications on file prior to visit.    BP 112/70 mmHg  Pulse 88  Temp(Src) 97.8 F (36.6 C) (Oral)  Ht 5' (1.524 m)  Wt 154 lb (69.854 kg)  BMI 30.08 kg/m2       Objective:   Physical Exam  Constitutional: She is oriented to person, place, and time. She appears well-developed and well-nourished. No distress.  HENT:  Head: Normocephalic and atraumatic.  Right Ear: External ear normal.  Left Ear: External ear normal.  Eyes: EOM are normal. Pupils are equal, round, and reactive to light.  Neck: Neck supple.  No carotid bruit  Cardiovascular: Normal rate, regular rhythm and normal heart sounds.   No murmur heard. Pulmonary/Chest: Effort normal and breath sounds normal. She has no wheezes.  Abdominal: Soft. Bowel sounds are normal. There is no tenderness.  Musculoskeletal: She exhibits no edema.  Lymphadenopathy:    She has no cervical adenopathy.    Neurological: She is alert and oriented to person, place, and time. No cranial nerve deficit.  Skin: Skin is dry.  Psychiatric: She has a normal mood and affect. Her behavior is normal.          Assessment & Plan:

## 2014-09-08 NOTE — Assessment & Plan Note (Signed)
Reviewed adult health maintenance protocols.  Medicare wellness questionnaire reviewed in detail. Patient does not drink. Patient counseled on tobacco cessation. Her exercise limited by multiple recent surgeries. She denies any difficulty with activities of daily living. Her gait is normal. She denies any hearing loss. Screening for depression and memory loss negative.  She is up-to-date with all vaccines. Patient's last screening mammogram was 04/08/2014. She plans to schedule repeat exam in October 2016.

## 2014-09-08 NOTE — Assessment & Plan Note (Signed)
Blood pressure well controlled.  Continue same therapy. BP: 112/70 mmHg  Patient's electrolytes and kidney function stable.

## 2014-09-08 NOTE — Assessment & Plan Note (Signed)
Patient's LDL not at goal. Continue same dose of statin. Encouraged low saturated fat diet.

## 2014-09-08 NOTE — Progress Notes (Signed)
Pre visit review using our clinic review tool, if applicable. No additional management support is needed unless otherwise documented below in the visit note. 

## 2014-09-08 NOTE — Assessment & Plan Note (Signed)
Monitor TFTs before next office visit.  Continue levothyroxine 88 g once daily

## 2014-09-10 ENCOUNTER — Telehealth: Payer: Self-pay | Admitting: Internal Medicine

## 2014-09-10 NOTE — Telephone Encounter (Signed)
emmi emailed °

## 2014-09-16 ENCOUNTER — Other Ambulatory Visit: Payer: Self-pay | Admitting: Internal Medicine

## 2014-09-27 ENCOUNTER — Encounter: Payer: Self-pay | Admitting: Internal Medicine

## 2014-10-18 ENCOUNTER — Telehealth: Payer: Self-pay | Admitting: Internal Medicine

## 2014-10-18 MED ORDER — OMEPRAZOLE 40 MG PO CPDR: 40.0000 mg | DELAYED_RELEASE_CAPSULE | Freq: Every day | ORAL | Status: DC

## 2014-10-18 NOTE — Telephone Encounter (Signed)
rx sent in electronically 

## 2014-10-18 NOTE — Telephone Encounter (Signed)
Pt only takes omeprazole 40 mg once a day. Pt needs a [facility] rx [facility] 40 mg #90 w/refills

## 2014-11-02 ENCOUNTER — Other Ambulatory Visit: Payer: Self-pay | Admitting: Dermatology

## 2014-11-05 ENCOUNTER — Other Ambulatory Visit: Payer: Self-pay | Admitting: Internal Medicine

## 2014-12-03 ENCOUNTER — Other Ambulatory Visit: Payer: Self-pay | Admitting: Internal Medicine

## 2014-12-03 ENCOUNTER — Other Ambulatory Visit: Payer: Self-pay | Admitting: *Deleted

## 2014-12-03 MED ORDER — SIMVASTATIN 20 MG PO TABS: ORAL_TABLET | ORAL | Status: DC

## 2014-12-03 MED ORDER — LEVOTHYROXINE SODIUM 88 MCG PO TABS: 88.0000 ug | ORAL_TABLET | Freq: Every day | ORAL | Status: DC

## 2014-12-21 ENCOUNTER — Telehealth: Payer: Self-pay | Admitting: Internal Medicine

## 2014-12-21 MED ORDER — CLONAZEPAM 1 MG PO TABS: 1.0000 mg | ORAL_TABLET | Freq: Every day | ORAL | Status: DC

## 2014-12-21 NOTE — Telephone Encounter (Signed)
Please refill if appropriate

## 2014-12-21 NOTE — Telephone Encounter (Signed)
Pt request refill clonazePAM (KLONOPIN) 1 MG tablet Ppt requested last week and has been out for 3 days. Pt leaving town and needs asap  Can you help w/ this?  [facility]

## 2015-01-03 ENCOUNTER — Encounter: Payer: Self-pay | Admitting: Internal Medicine

## 2015-01-05 ENCOUNTER — Telehealth: Payer: Self-pay | Admitting: Internal Medicine

## 2015-01-05 MED ORDER — POTASSIUM CHLORIDE CRYS ER 20 MEQ PO TBCR: EXTENDED_RELEASE_TABLET | ORAL | Status: DC

## 2015-01-05 NOTE — Telephone Encounter (Signed)
Pt needs new rx potassium chloride #270 w/refills sent to [facility]. Pt needs 21 pills sent to [facility] in [place] on  [address].

## 2015-01-05 NOTE — Telephone Encounter (Signed)
rx sent in electronically 

## 2015-01-12 ENCOUNTER — Telehealth: Payer: Self-pay | Admitting: Internal Medicine

## 2015-01-12 NOTE — Telephone Encounter (Signed)
Caller name: Estie Relation to pt: Call back number: [phone] Pharmacy:  Reason for call:   Patient would like to transfer to [facility] location because Dr. Shawna Orleans is out. Would this be ok for you to see this patient as a new patient?

## 2015-01-13 NOTE — Telephone Encounter (Signed)
Fine with me

## 2015-01-14 NOTE — Telephone Encounter (Signed)
Appointment scheduled.

## 2015-01-26 ENCOUNTER — Encounter: Payer: Self-pay | Admitting: Physical Therapy

## 2015-01-26 ENCOUNTER — Ambulatory Visit: Payer: Medicare Other | Attending: Neurosurgery | Admitting: Physical Therapy

## 2015-01-26 DIAGNOSIS — M546 Pain in thoracic spine: Secondary | ICD-10-CM | POA: Insufficient documentation

## 2015-01-26 DIAGNOSIS — R29898 Other symptoms and signs involving the musculoskeletal system: Secondary | ICD-10-CM | POA: Diagnosis present

## 2015-01-26 NOTE — Therapy (Signed)
[facility] [address]  [place], [place], [address] Phone: [phone]   Fax:  [phone]  Physical Therapy Evaluation  Patient Details  Name: Joan [name] MRN: [id number] Date of Birth: 09-23-1945 Referring Provider:  Kary Kos, MD  Encounter Date: 01/26/2015      PT End of Session - 01/26/15 1532    Visit Number 1   Number of Visits 8   Date for PT Re-Evaluation 02/23/15   PT Start Time 9678   PT Stop Time 9381   PT Time Calculation (min) 53 min      Past Medical History  Diagnosis Date  . Chronic low back pain     followed by Dr Hardin Negus pain mgt  . Hyperlipemia   . Tobacco abuse   . Hypothyroidism   . History of cardiac arrhythmia     cardiologist- Traci Turner  . Esophageal stricture   . Thyroid nodule   . Colon polyp   . PONV (postoperative nausea and vomiting)     Pt reports symptoms are the result of gallbladder and cholecystectomy, not anesthesia  . Heart murmur     "slight; not on RX" (06/24/2013)  . Sleep apnea 1990's    "tested; tried mask; couldn't stand it; told me as long as I slept on my side I'd be ok" (06/24/2013)  . Osteoarthritis of left knee     advanced  . Arthritis     "shoulders; wrist; probably spine" (06/24/2013)  . GERD (gastroesophageal reflux disease)   . Constipation   . Hypertension   . PVC's (premature ventricular contractions)     Past Surgical History  Procedure Laterality Date  . Lumbar fusion      and rods  . Cesarean section  1972  . Ankle surgery Left 1995    "tendon repair" (06/24/2013)  . Infusion pump implantation  1990's    "implantablet morphine pump; took it out w/in 11 months  . Knee arthroscopy Left 1991; ~ 1993  . Elbow surgery  1990's  . Thyroidectomy  2012  . Cervical spine surgery  2012  . Foot surgery Right 2012    SPUR REMOVED  . Shoulder arthroscopy Left 09/2011  . Lumbar laminectomy/decompression microdiscectomy N/A 11/28/2012     Procedure: DECOMPRESSIVE LUMBAR LAMINECTOMY LEVEL 1;  Surgeon: Elaina Hoops, MD;  Location: [facility];  Service: Neurosurgery;  Laterality: N/A;  DECOMPRESSIVE LUMBAR LAMINECTOMY LEVEL 1  . Back surgery      "think today was my 8th back OR" (06/24/2013)  . Cholecystectomy N/A 04/16/2013    Procedure: LAPAROSCOPIC CHOLECYSTECTOMY ;  Surgeon: Imogene Burn. Georgette Dover, MD;  Location: [facility];  Service: General;  Laterality: N/A;  . Laparoscopic lysis of adhesions N/A 04/16/2013    Procedure: LAPAROSCOPIC LYSIS OF ADHESIONS;  Surgeon: Imogene Burn. Tsuei, MD;  Location: [facility];  Service: General;  Laterality: N/A;  . Posterior fusion lumbar spine  06/24/2013  . Abdominal hysterectomy  1988    "partial" (06/24/2013)  . Orif distal radius fracture Left 12/30/2013    dr Caralyn Guile  . Orif wrist fracture Left 12/30/2013    Procedure: OPEN REDUCTION INTERNAL FIXATION (ORIF) LEFT WRIST FRACTURE AND REPAIR AS INDICATED;  Surgeon: Linna Hoff, MD;  Location: [facility];  Service: Orthopedics;  Laterality: Left;  . Robotic assisted salpingo oopherectomy Bilateral 08/20/2014    Procedure: ROBOTIC ASSISTED SALPINGO OOPHORECTOMY;  Surgeon: Daria Pastures, MD;  Location: [facility];  Service: Gynecology;  Laterality: Bilateral;    There were no vitals filed for this visit.  Visit Diagnosis:  Bilateral thoracic back pain - Plan: PT plan of care cert/re-cert  Shoulder weakness - Plan: PT plan of care cert/re-cert      Subjective Assessment - 01/26/15 1445    Subjective pt with c/o thoracic which she states has worsened over the past year.  She was seen at this clinic last year for similar issue - denies performing HEP from this.  She has undergone 5 lumbar surgeries beginning in 1990's and most recent surgery in 2014 which resulted in T10 through S1 fusion.  Currently pt states all activities limited due to back pain.  She states she is unable to lie prone or supine and she sleeps with 3-4 pillows under her upper body to  sleep for the past 3 years or so.  She states she wears lumbar brace "to do anything" at this time.  Denies N/T.   Patient Stated Goals be able to do stuff without wearing back brace   Currently in Pain? Yes   Pain Score --  5-6/10 AVG pain with activity and up to 8/10 at worst   Pain Location Back   Pain Orientation Mid   Pain Onset More than a month ago   Aggravating Factors  movement   Pain Relieving Factors heat, medication, reclined position            [facility] PT Assessment - 01/26/15 0001    Assessment   Medical Diagnosis l-spine DDD   Onset Date/Surgical Date 07/09/88   Balance Screen   Has the patient fallen in the past 6 months No   Has the patient had a decrease in activity level because of a fear of falling?  No   Is the patient reluctant to leave their home because of a fear of falling?  No   Home Ecologist residence   [place] level  difficulty with stairs due to knee and back pain   Prior Function   Vocation Retired   Leisure denies regular exercise, denies intests or hobbies   Observation/Other Assessments   Focus on Therapeutic Outcomes (FOTO)  57% limitation   Posture/Postural Control   Posture Comments sits with slouched posture with hips forward in chair causing posterior pelvic tilt. Stands with mild B knee flexion, increased t-spine kyphosis with FW head and rounded shoulders.   ROM / Strength   AROM / PROM / Strength AROM;Strength   AROM   Overall AROM Comments B Shoulder Flexion and ABD limited to approx 120 degrees without c/o pain   Strength   Overall Strength Comments B Shoulder Flexion 3+/5 due to increased mid back pain; B ER 4-/5 with c/o mid back pain, B ABD 4/5 no pain   Palpation   Palpation comment very TTP R mid and lower medial scapular muscles as well as R t-spine paraspinals.  Minimal TTP noted on L.         TODAY'S TREATMENT HEP instruct and perform: Seated Low Row  with Blue TB 15x  IFC (80-150Hz ) with MHP to mid and lower t-spine in R side-lying x 15'                  PT Education - 01/26/15 1530    Education provided Yes   Education Details HEP   Person(s) Educated Patient   Methods Explanation;Demonstration;Handout   Comprehension Verbalized understanding;Returned demonstration  PT Short Term Goals - 01/28/2015 1538    PT SHORT TERM GOAL #1   Title pt independent in intial HEP by 02/04/15   Status New           PT Long Term Goals - 2015-01-28 1538    PT LONG TERM GOAL #1   Title pt independent with advanced HEP as necessary by 02/23/15   PT LONG TERM GOAL #2   Title B shoulder MMT 4+/5 or better without c/o back pain with testing by 02/23/15   Status New   PT LONG TERM GOAL #3   Title pt reports able to perform chores and ADLs with greater ease without constant need for back brace and rarely limited by back pain by 02/23/15   Status New               Plan - 2015-01-28 1533    Clinical Impression Statement pt with c/o lower and mid t-spine pain related to past T10-S1 fusion.  She displays slouched posture and forward head with rounded shoulders.  Her B shoulder strength is limited by c/o increased t-spine pain and B shoulder flexion/scaption AROM is limited to approx 120 degrees (likely at least in part due to tightness in B lats).  PT will focus on scapular and t-spine stability training along with modalities for pain PRN.   Pt will benefit from skilled therapeutic intervention in order to improve on the following deficits Pain;Postural dysfunction;Decreased strength;Decreased mobility;Impaired flexibility;Improper body mechanics   Rehab Potential Good   PT Frequency 2x / week   PT Duration 4 weeks   PT Treatment/Interventions Therapeutic exercise;Therapeutic activities;Taping;Manual techniques;Patient/family education;Electrical Stimulation;Moist Heat   PT Next Visit Plan t-spine extension/scapular retraction  exercises, RC strengthening, stretch to pecs and lats as tolerated; modalities and manual PRN   Consulted and Agree with Plan of Care Patient          G-Codes - January 28, 2015 1442    Functional Assessment Tool Used foto 57% limitation   Functional Limitation Mobility: Walking and moving around   Mobility: Walking and Moving Around Current Status [phone]) At least 40 percent but less than 60 percent impaired, limited or restricted   Mobility: Walking and Moving Around Goal Status [phone]) At least 20 percent but less than 40 percent impaired, limited or restricted       Problem List Patient Active Problem List   Diagnosis Date Noted  . Preventative health care 09/08/2014  . Ovarian cyst 08/13/2014  . Preoperative cardiovascular examination 08/13/2014  . Abnormal EKG 08/13/2014  . PVC's (premature ventricular contractions)   . Fracture of left distal radius 12/30/2013  . Spinal stenosis of lumbar region 06/24/2013  . Atherosclerosis of aorta 05/06/2013  . Post-operative nausea and vomiting 04/29/2013  . Chronic cholecystitis with calculus 03/16/2013  . Gallstone 03/06/2013  . Osteopenia 03/06/2013  . Edema 11/05/2012  . Laceration 01/09/2012  . Myalgia 04/11/2011  . Night sweats 12/27/2010  . Contact lens/glasses fitting 12/27/2010  . Menopause 12/27/2010  . Family history of breast cancer 12/27/2010  . ADJUSTMENT DISORDER WITH ANXIOUS MOOD 05/26/2010  . PULMONARY NODULE, LEFT UPPER LOBE 12/15/2009  . CONSTIPATION, DRUG INDUCED 08/19/2009  . WEIGHT LOSS, ABNORMAL 06/08/2009  . KNEE PAIN, LEFT 09/13/2008  . THYROID NODULE, LEFT 05/26/2008  . GERD 05/26/2008  . COLONIC POLYPS, ADENOMATOUS, HX OF 05/26/2008  . INSOMNIA 05/06/2008  . DYSPHAGIA UNSPECIFIED 05/06/2008  . ANEMIA, MILD 10/24/2007  . Hypothyroidism 08/12/2007  . Hyperlipidemia 08/12/2007  . TOBACCO ABUSE 08/12/2007  .  Essential hypertension 08/12/2007  . DEGENERATIVE JOINT DISEASE, LEFT KNEE 08/12/2007     Kynslee Baham PT, OCS 01/26/2015, 3:46 PM  [facility] [address]  [place], [place], [address] Phone: [phone]   Fax:  [phone]

## 2015-01-31 ENCOUNTER — Other Ambulatory Visit: Payer: Self-pay | Admitting: Internal Medicine

## 2015-02-01 ENCOUNTER — Ambulatory Visit: Payer: Medicare Other | Admitting: Rehabilitation

## 2015-02-01 DIAGNOSIS — M546 Pain in thoracic spine: Secondary | ICD-10-CM | POA: Diagnosis not present

## 2015-02-01 DIAGNOSIS — R29898 Other symptoms and signs involving the musculoskeletal system: Secondary | ICD-10-CM

## 2015-02-01 NOTE — Therapy (Signed)
[facility] [address]  [place], [place], [address] Phone: [phone]   Fax:  [phone]  Physical Therapy Treatment  Patient Details  Name: Joan [name] MRN: [id number] Date of Birth: 08-29-1945 Referring Provider:  Kary Kos, MD  Encounter Date: 02/01/2015      PT End of Session - 02/01/15 1434    Visit Number 2   Number of Visits 8   Date for PT Re-Evaluation 02/23/15   PT Start Time 4481   PT Stop Time 1533   PT Time Calculation (min) 58 min      Past Medical History  Diagnosis Date  . Chronic low back pain     followed by Dr Hardin Negus pain mgt  . Hyperlipemia   . Tobacco abuse   . Hypothyroidism   . History of cardiac arrhythmia     cardiologist- Traci Turner  . Esophageal stricture   . Thyroid nodule   . Colon polyp   . PONV (postoperative nausea and vomiting)     Pt reports symptoms are the result of gallbladder and cholecystectomy, not anesthesia  . Heart murmur     "slight; not on RX" (06/24/2013)  . Sleep apnea 1990's    "tested; tried mask; couldn't stand it; told me as long as I slept on my side I'd be ok" (06/24/2013)  . Osteoarthritis of left knee     advanced  . Arthritis     "shoulders; wrist; probably spine" (06/24/2013)  . GERD (gastroesophageal reflux disease)   . Constipation   . Hypertension   . PVC's (premature ventricular contractions)     Past Surgical History  Procedure Laterality Date  . Lumbar fusion      and rods  . Cesarean section  1972  . Ankle surgery Left 1995    "tendon repair" (06/24/2013)  . Infusion pump implantation  1990's    "implantablet morphine pump; took it out w/in 11 months  . Knee arthroscopy Left 1991; ~ 1993  . Elbow surgery  1990's  . Thyroidectomy  2012  . Cervical spine surgery  2012  . Foot surgery Right 2012    SPUR REMOVED  . Shoulder arthroscopy Left 09/2011  . Lumbar laminectomy/decompression microdiscectomy N/A 11/28/2012     Procedure: DECOMPRESSIVE LUMBAR LAMINECTOMY LEVEL 1;  Surgeon: Elaina Hoops, MD;  Location: [facility];  Service: Neurosurgery;  Laterality: N/A;  DECOMPRESSIVE LUMBAR LAMINECTOMY LEVEL 1  . Back surgery      "think today was my 8th back OR" (06/24/2013)  . Cholecystectomy N/A 04/16/2013    Procedure: LAPAROSCOPIC CHOLECYSTECTOMY ;  Surgeon: Imogene Burn. Georgette Dover, MD;  Location: [facility];  Service: General;  Laterality: N/A;  . Laparoscopic lysis of adhesions N/A 04/16/2013    Procedure: LAPAROSCOPIC LYSIS OF ADHESIONS;  Surgeon: Imogene Burn. Tsuei, MD;  Location: [facility];  Service: General;  Laterality: N/A;  . Posterior fusion lumbar spine  06/24/2013  . Abdominal hysterectomy  1988    "partial" (06/24/2013)  . Orif distal radius fracture Left 12/30/2013    dr Caralyn Guile  . Orif wrist fracture Left 12/30/2013    Procedure: OPEN REDUCTION INTERNAL FIXATION (ORIF) LEFT WRIST FRACTURE AND REPAIR AS INDICATED;  Surgeon: Linna Hoff, MD;  Location: [facility];  Service: Orthopedics;  Laterality: Left;  . Robotic assisted salpingo oopherectomy Bilateral 08/20/2014    Procedure: ROBOTIC ASSISTED SALPINGO OOPHORECTOMY;  Surgeon: Daria Pastures, MD;  Location: [facility];  Service: Gynecology;  Laterality: Bilateral;    There were no vitals filed for this visit.  Visit Diagnosis:  Bilateral thoracic back pain  Shoulder weakness      Subjective Assessment - 02/01/15 1438    Subjective Reports pain isn't too bad right now, states she just took some pain medication. States she hasn't been able to do her exercise due to sister being admitted to the hospital.    Currently in Pain? Yes   Pain Score 3    Pain Location Back   Pain Orientation Mid     TODAY'S TREATMENT: TherEx- Seated Low Row Blue TB 15x Seated Childs pose/pball roll out 5x5" M/R/L Seated Thoracic Extension against pball 10x3" Hooklying (head of bed elevated) Bil ER Green TB 10x Hooklying (head of bed elevated) Scap Retraction with ER iso  10x3" Hooklying (head of bed elevated) Alt Horiz Abd 10x   Manual- STM to Bil thoracic and lumbar paraspinals (Rt>Lt) with pt in Rt Side-Lying  IFC (80-150) with MHP x15' with pt in Rt Side-Lying       PT Short Term Goals - 02/01/15 1450    PT SHORT TERM GOAL #1   Title pt independent in intial HEP by 02/04/15   Status On-going           PT Long Term Goals - 02/01/15 1450    PT LONG TERM GOAL #1   Title pt independent with advanced HEP as necessary by 02/23/15   Status On-going   PT LONG TERM GOAL #2   Title B shoulder MMT 4+/5 or better without c/o back pain with testing by 02/23/15   Status On-going   PT LONG TERM GOAL #3   Title pt reports able to perform chores and ADLs with greater ease without constant need for back brace and rarely limited by back pain by 02/23/15   Status On-going               Plan - 02/01/15 1517    Clinical Impression Statement Limited tolerace to hooklying UE exercises with pt's UE's fatiging quickly. Good tolerance to manual work.    PT Next Visit Plan t-spine extension/scapular retraction exercises, RC strengthening, stretch to pecs and lats as tolerated; modalities and manual PRN   Consulted and Agree with Plan of Care Patient        Problem List Patient Active Problem List   Diagnosis Date Noted  . Preventative health care 09/08/2014  . Ovarian cyst 08/13/2014  . Preoperative cardiovascular examination 08/13/2014  . Abnormal EKG 08/13/2014  . PVC's (premature ventricular contractions)   . Fracture of left distal radius 12/30/2013  . Spinal stenosis of lumbar region 06/24/2013  . Atherosclerosis of aorta 05/06/2013  . Post-operative nausea and vomiting 04/29/2013  . Chronic cholecystitis with calculus 03/16/2013  . Gallstone 03/06/2013  . Osteopenia 03/06/2013  . Edema 11/05/2012  . Laceration 01/09/2012  . Myalgia 04/11/2011  . Night sweats 12/27/2010  . Contact lens/glasses fitting 12/27/2010  . Menopause 12/27/2010   . Family history of breast cancer 12/27/2010  . ADJUSTMENT DISORDER WITH ANXIOUS MOOD 05/26/2010  . PULMONARY NODULE, LEFT UPPER LOBE 12/15/2009  . CONSTIPATION, DRUG INDUCED 08/19/2009  . WEIGHT LOSS, ABNORMAL 06/08/2009  . KNEE PAIN, LEFT 09/13/2008  . THYROID NODULE, LEFT 05/26/2008  . GERD 05/26/2008  . COLONIC POLYPS, ADENOMATOUS, HX OF 05/26/2008  . INSOMNIA 05/06/2008  . DYSPHAGIA UNSPECIFIED 05/06/2008  . ANEMIA, MILD 10/24/2007  . Hypothyroidism 08/12/2007  . Hyperlipidemia 08/12/2007  . TOBACCO ABUSE 08/12/2007  .  Essential hypertension 08/12/2007  . DEGENERATIVE JOINT DISEASE, LEFT KNEE 08/12/2007    Barbette Hair, PTA 02/01/2015, 3:19 PM  [facility] [address]  [place], [place], [address] Phone: [phone]   Fax:  [phone]

## 2015-02-03 ENCOUNTER — Other Ambulatory Visit: Payer: Self-pay | Admitting: Family Medicine

## 2015-02-03 NOTE — Telephone Encounter (Signed)
This is not our patient, it looks like pt will be seeing you in Sept.

## 2015-02-07 ENCOUNTER — Ambulatory Visit: Payer: Medicare Other | Attending: Neurosurgery | Admitting: Rehabilitation

## 2015-02-07 DIAGNOSIS — R29898 Other symptoms and signs involving the musculoskeletal system: Secondary | ICD-10-CM | POA: Insufficient documentation

## 2015-02-07 DIAGNOSIS — M546 Pain in thoracic spine: Secondary | ICD-10-CM | POA: Insufficient documentation

## 2015-02-07 NOTE — Therapy (Signed)
[facility] [address]  [place], [place], [address] Phone: [phone]   Fax:  [phone]  Physical Therapy Treatment  Patient Details  Name: Joan [name] MRN: [id number] Date of Birth: 1945-12-28 Referring Provider:  Kary Kos, MD  Encounter Date: 02/07/2015      PT End of Session - 02/07/15 1442    Visit Number 3   Number of Visits 8   Date for PT Re-Evaluation 02/23/15   PT Start Time 5701   PT Stop Time 1540   PT Time Calculation (min) 52 min      Past Medical History  Diagnosis Date  . Chronic low back pain     followed by Dr Hardin Negus pain mgt  . Hyperlipemia   . Tobacco abuse   . Hypothyroidism   . History of cardiac arrhythmia     cardiologist- Traci Turner  . Esophageal stricture   . Thyroid nodule   . Colon polyp   . PONV (postoperative nausea and vomiting)     Pt reports symptoms are the result of gallbladder and cholecystectomy, not anesthesia  . Heart murmur     "slight; not on RX" (06/24/2013)  . Sleep apnea 1990's    "tested; tried mask; couldn't stand it; told me as long as I slept on my side I'd be ok" (06/24/2013)  . Osteoarthritis of left knee     advanced  . Arthritis     "shoulders; wrist; probably spine" (06/24/2013)  . GERD (gastroesophageal reflux disease)   . Constipation   . Hypertension   . PVC's (premature ventricular contractions)     Past Surgical History  Procedure Laterality Date  . Lumbar fusion      and rods  . Cesarean section  1972  . Ankle surgery Left 1995    "tendon repair" (06/24/2013)  . Infusion pump implantation  1990's    "implantablet morphine pump; took it out w/in 11 months  . Knee arthroscopy Left 1991; ~ 1993  . Elbow surgery  1990's  . Thyroidectomy  2012  . Cervical spine surgery  2012  . Foot surgery Right 2012    SPUR REMOVED  . Shoulder arthroscopy Left 09/2011  . Lumbar laminectomy/decompression microdiscectomy N/A 11/28/2012     Procedure: DECOMPRESSIVE LUMBAR LAMINECTOMY LEVEL 1;  Surgeon: Elaina Hoops, MD;  Location: [facility];  Service: Neurosurgery;  Laterality: N/A;  DECOMPRESSIVE LUMBAR LAMINECTOMY LEVEL 1  . Back surgery      "think today was my 8th back OR" (06/24/2013)  . Cholecystectomy N/A 04/16/2013    Procedure: LAPAROSCOPIC CHOLECYSTECTOMY ;  Surgeon: Imogene Burn. Georgette Dover, MD;  [facility];  Service: General;  Laterality: N/A;  . Laparoscopic lysis of adhesions N/A 04/16/2013    Procedure: LAPAROSCOPIC LYSIS OF ADHESIONS;  Surgeon: Imogene Burn. Tsuei, MD;  [facility];  Service: General;  Laterality: N/A;  . Posterior fusion lumbar spine  06/24/2013  . Abdominal hysterectomy  1988    "partial" (06/24/2013)  . Orif distal radius fracture Left 12/30/2013    dr Caralyn Guile  . Orif wrist fracture Left 12/30/2013    Procedure: OPEN REDUCTION INTERNAL FIXATION (ORIF) LEFT WRIST FRACTURE AND REPAIR AS INDICATED;  Surgeon: Linna Hoff, MD;  Location: [facility];  Service: Orthopedics;  Laterality: Left;  . Robotic assisted salpingo oopherectomy Bilateral 08/20/2014    Procedure: ROBOTIC ASSISTED SALPINGO OOPHORECTOMY;  Surgeon: Daria Pastures, MD;  Location: [facility];  Service: Gynecology;  Laterality: Bilateral;    There were no vitals filed for this visit.  Visit Diagnosis:  Bilateral thoracic back pain  Shoulder weakness      Subjective Assessment - 02/07/15 1501    Subjective States she is trying to space out her pain medication more so she is in more pain. Had to do a lot of walking over the weekend while visiting her sister in the hospital and hasn't been sleeping good either. States she felt ok after last time.    Currently in Pain? Yes   Pain Score 7    Pain Location Back   Pain Orientation Mid         TODAY'S TREATMENT: TherEx- Seated Childs pose/pball (65cm) roll out 5x5" M/R/L Seated Thoracic Extension against pball (65cm) 10x3" Seated Low Row Blue TB 15x Seated Bil Shoulder Blue  TB 10x Hooklying (head of bed elevated) Bil ER Green TB 10x Hooklying (head of bed elevated) Scap Retraction with ER iso 10x3" Hooklying (head of bed elevated) Horizontal Abduction AROM 10x (low)  Manual- STM to Bil thoracic and lumbar paraspinals (Rt>Lt) with pt in Rt Side-Lying  IFC (80-150) with MHP x15' with pt in Rt Side-Lying        PT Short Term Goals - 02/01/15 1450    PT SHORT TERM GOAL #1   Title pt independent in intial HEP by 02/04/15   Status On-going           PT Long Term Goals - 02/01/15 1450    PT LONG TERM GOAL #1   Title pt independent with advanced HEP as necessary by 02/23/15   Status On-going   PT LONG TERM GOAL #2   Title B shoulder MMT 4+/5 or better without c/o back pain with testing by 02/23/15   Status On-going   PT LONG TERM GOAL #3   Title pt reports able to perform chores and ADLs with greater ease without constant need for back brace and rarely limited by back pain by 02/23/15   Status On-going               Plan - 02/07/15 1504    Clinical Impression Statement Kept treatment similar to last time due to higher pain levels today. Pt unable to perform pullover or proper horizontal abduction so performed modified version today. Also her wrist was bothering her, causing her unable to perform band exercises due to wrist pain. Pt noted at the end of treatment that her hip started hurting after therapy last time and again today. Told pt that she needs to let us know so we can have her change positions to prevent too much pressure on that hip.    PT Next Visit Plan t-spine extension/scapular retraction exercises, RC strengthening, stretch to pecs and lats as tolerated; modalities and manual PRN   Consulted and Agree with Plan of Care Patient        Problem List Patient Active Problem List   Diagnosis Date Noted  . Preventative health care 09/08/2014  . Ovarian cyst 08/13/2014  . Preoperative cardiovascular examination 08/13/2014  . Abnormal  EKG 08/13/2014  . PVC's (premature ventricular contractions)   . Fracture of left distal radius 12/30/2013  . Spinal stenosis of lumbar region 06/24/2013  . Atherosclerosis of aorta 05/06/2013  . Post-operative nausea and vomiting 04/29/2013  . Chronic cholecystitis with calculus 03/16/2013  . Gallstone 03/06/2013  . Osteopenia 03/06/2013  . Edema 11/05/2012  . Laceration 01/09/2012  . Myalgia 04/11/2011  . Night sweats  12/27/2010  . Contact lens/glasses fitting 12/27/2010  . Menopause 12/27/2010  . Family history of breast cancer 12/27/2010  . ADJUSTMENT DISORDER WITH ANXIOUS MOOD 05/26/2010  . PULMONARY NODULE, LEFT UPPER LOBE 12/15/2009  . CONSTIPATION, DRUG INDUCED 08/19/2009  . WEIGHT LOSS, ABNORMAL 06/08/2009  . KNEE PAIN, LEFT 09/13/2008  . THYROID NODULE, LEFT 05/26/2008  . GERD 05/26/2008  . COLONIC POLYPS, ADENOMATOUS, HX OF 05/26/2008  . INSOMNIA 05/06/2008  . DYSPHAGIA UNSPECIFIED 05/06/2008  . ANEMIA, MILD 10/24/2007  . Hypothyroidism 08/12/2007  . Hyperlipidemia 08/12/2007  . TOBACCO ABUSE 08/12/2007  . Essential hypertension 08/12/2007  . DEGENERATIVE JOINT DISEASE, LEFT KNEE 08/12/2007    Barbette Hair, PTA 02/07/2015, 3:53 PM  [facility] [address]  [place], [place], [address] Phone: [phone]   Fax:  [phone]

## 2015-02-09 ENCOUNTER — Ambulatory Visit: Payer: Medicare Other | Admitting: Rehabilitation

## 2015-02-09 DIAGNOSIS — R29898 Other symptoms and signs involving the musculoskeletal system: Secondary | ICD-10-CM

## 2015-02-09 DIAGNOSIS — M546 Pain in thoracic spine: Secondary | ICD-10-CM | POA: Diagnosis not present

## 2015-02-09 NOTE — Therapy (Signed)
[facility] [address]  [place], [place], [address] Phone: [phone]   Fax:  [phone]  Physical Therapy Treatment  Patient Details  Name: Joan [name] MRN: [id number] Date of Birth: [birth year] Referring Provider:  Kary Kos, MD  Encounter Date: 02/09/2015      PT End of Session - 02/09/15 1434    Visit Number 4   Number of Visits 8   Date for PT Re-Evaluation 02/23/15   PT Start Time 4580   PT Stop Time 1530   PT Time Calculation (min) 55 min      Past Medical History  Diagnosis Date  . Chronic low back pain     followed by Dr Hardin Negus pain mgt  . Hyperlipemia   . Tobacco abuse   . Hypothyroidism   . History of cardiac arrhythmia     cardiologist- Traci Turner  . Esophageal stricture   . Thyroid nodule   . Colon polyp   . PONV (postoperative nausea and vomiting)     Pt reports symptoms are the result of gallbladder and cholecystectomy, not anesthesia  . Heart murmur     "slight; not on RX" (06/24/2013)  . Sleep apnea 1990's    "tested; tried mask; couldn't stand it; told me as long as I slept on my side I'd be ok" (06/24/2013)  . Osteoarthritis of left knee     advanced  . Arthritis     "shoulders; wrist; probably spine" (06/24/2013)  . GERD (gastroesophageal reflux disease)   . Constipation   . Hypertension   . PVC's (premature ventricular contractions)     Past Surgical History  Procedure Laterality Date  . Lumbar fusion      and rods  . Cesarean section  1972  . Ankle surgery Left 1995    "tendon repair" (06/24/2013)  . Infusion pump implantation  1990's    "implantablet morphine pump; took it out w/in 11 months  . Knee arthroscopy Left 1991; ~ 1993  . Elbow surgery  1990's  . Thyroidectomy  2012  . Cervical spine surgery  2012  . Foot surgery Right 2012    SPUR REMOVED  . Shoulder arthroscopy Left 09/2011  . Lumbar laminectomy/decompression microdiscectomy N/A 11/28/2012     Procedure: DECOMPRESSIVE LUMBAR LAMINECTOMY LEVEL 1;  Surgeon: Elaina Hoops, MD;  Location: [facility];  Service: Neurosurgery;  Laterality: N/A;  DECOMPRESSIVE LUMBAR LAMINECTOMY LEVEL 1  . Back surgery      "think today was my 8th back OR" (06/24/2013)  . Cholecystectomy N/A 04/16/2013    Procedure: LAPAROSCOPIC CHOLECYSTECTOMY ;  Surgeon: Imogene Burn. Georgette Dover, MD;  Location: [facility];  Service: General;  Laterality: N/A;  . Laparoscopic lysis of adhesions N/A 04/16/2013    Procedure: LAPAROSCOPIC LYSIS OF ADHESIONS;  Surgeon: Imogene Burn. Tsuei, MD;  Location: [facility];  Service: General;  Laterality: N/A;  . Posterior fusion lumbar spine  06/24/2013  . Abdominal hysterectomy  1988    "partial" (06/24/2013)  . Orif distal radius fracture Left 12/30/2013    dr Caralyn Guile  . Orif wrist fracture Left 12/30/2013    Procedure: OPEN REDUCTION INTERNAL FIXATION (ORIF) LEFT WRIST FRACTURE AND REPAIR AS INDICATED;  Surgeon: Linna Hoff, MD;  Location: [facility];  Service: Orthopedics;  Laterality: Left;  . Robotic assisted salpingo oopherectomy Bilateral 08/20/2014    Procedure: ROBOTIC ASSISTED SALPINGO OOPHORECTOMY;  Surgeon: Daria Pastures, MD;  Location: [facility];  Service: Gynecology;  Laterality: Bilateral;    There were no vitals filed for this visit.  Visit Diagnosis:  Bilateral thoracic back pain  Shoulder weakness      Subjective Assessment - 02/09/15 1437    Subjective Reports she is feeling ok, able to stretch out her pain medication for 5 hours now.    Currently in Pain? Yes   Pain Score 4    Pain Location Back   Pain Orientation Mid        TODAY'S TREATMENT: TherEx- Seated Childs pose/pball (65cm) roll out 10x5" M/R/L Warehouse manager 2x15" (pt noted Rt shoulder pain, states she has been having more pain in it which is due to arthritis) Hooklying (head of bed elevated) Scap Retraction with ER iso 10x3" Hooklying (head of bed elevated) Horizontal Abduction AROM 10x (low) Hooklying  (head of bed elevated) D2 Flexion AROM 10x each UE (in painfree motion) Seated Thoracic Extension against pball (65cm) 10x3"  Manual- STM to Bil thoracic and lumbar paraspinals (Rt>Lt) with pt in Rt Side-Lying (offered pt the option to be sitting and she chose side-lying)  IFC (80-150) with MHP x15' with pt in Rt Side-Lying (offered pt the option to be hooklying and chose side-lying)        PT Short Term Goals - 02/01/15 1450    PT SHORT TERM GOAL #1   Title pt independent in intial HEP by 02/04/15   Status On-going           PT Long Term Goals - 02/01/15 1450    PT LONG TERM GOAL #1   Title pt independent with advanced HEP as necessary by 02/23/15   Status On-going   PT LONG TERM GOAL #2   Title B shoulder MMT 4+/5 or better without c/o back pain with testing by 02/23/15   Status On-going   PT LONG TERM GOAL #3   Title pt reports able to perform chores and ADLs with greater ease without constant need for back brace and rarely limited by back pain by 02/23/15   Status On-going               Plan - 02/09/15 1445    Clinical Impression Statement Difficulty to find exercises that do not irritate/cause pain in other areas. Pt reported Rt shoulder pain today which is from arthritis and Lt wrist pain which she states she needs surgery to remove hardwear that was placed earlier this year. We are also trying to be cautious about her hip pain that she reported last visit from possibly laying on that side too long. She did say that her hip wasn't hurting today and wanted to keep laying on her side rather than supine/hooklying.      PT Next Visit Plan t-spine extension/scapular retraction exercises, RC strengthening, stretch to pecs and lats as tolerated; modalities and manual PRN   Consulted and Agree with Plan of Care Patient        Problem List Patient Active Problem List   Diagnosis Date Noted  . Preventative health care 09/08/2014  . Ovarian cyst 08/13/2014  .  Preoperative cardiovascular examination 08/13/2014  . Abnormal EKG 08/13/2014  . PVC's (premature ventricular contractions)   . Fracture of left distal radius 12/30/2013  . Spinal stenosis of lumbar region 06/24/2013  . Atherosclerosis of aorta 05/06/2013  . Post-operative nausea and vomiting 04/29/2013  . Chronic cholecystitis with calculus 03/16/2013  . Gallstone 03/06/2013  . Osteopenia 03/06/2013  . Edema 11/05/2012  . Laceration 01/09/2012  . Myalgia 04/11/2011  .  Night sweats 12/27/2010  . Contact lens/glasses fitting 12/27/2010  . Menopause 12/27/2010  . Family history of breast cancer 12/27/2010  . ADJUSTMENT DISORDER WITH ANXIOUS MOOD 05/26/2010  . PULMONARY NODULE, LEFT UPPER LOBE 12/15/2009  . CONSTIPATION, DRUG INDUCED 08/19/2009  . WEIGHT LOSS, ABNORMAL 06/08/2009  . KNEE PAIN, LEFT 09/13/2008  . THYROID NODULE, LEFT 05/26/2008  . GERD 05/26/2008  . COLONIC POLYPS, ADENOMATOUS, HX OF 05/26/2008  . INSOMNIA 05/06/2008  . DYSPHAGIA UNSPECIFIED 05/06/2008  . ANEMIA, MILD 10/24/2007  . Hypothyroidism 08/12/2007  . Hyperlipidemia 08/12/2007  . TOBACCO ABUSE 08/12/2007  . Essential hypertension 08/12/2007  . DEGENERATIVE JOINT DISEASE, LEFT KNEE 08/12/2007    Barbette Hair, PTA 02/09/2015, 3:29 PM  [facility] [address]  [place], [place], [address] Phone: [phone]   Fax:  [phone]

## 2015-02-16 ENCOUNTER — Ambulatory Visit: Payer: Medicare Other | Admitting: Physical Therapy

## 2015-02-18 ENCOUNTER — Ambulatory Visit: Payer: Medicare Other | Admitting: Physical Therapy

## 2015-03-01 ENCOUNTER — Ambulatory Visit: Payer: Medicare Other | Admitting: Physical Therapy

## 2015-03-03 ENCOUNTER — Ambulatory Visit: Payer: Medicare Other | Admitting: Rehabilitation

## 2015-03-07 ENCOUNTER — Ambulatory Visit: Payer: Medicare Other | Admitting: Physical Therapy

## 2015-03-10 ENCOUNTER — Ambulatory Visit: Payer: Medicare Other | Attending: Neurosurgery | Admitting: Physical Therapy

## 2015-03-10 DIAGNOSIS — M546 Pain in thoracic spine: Secondary | ICD-10-CM | POA: Diagnosis present

## 2015-03-10 DIAGNOSIS — R29898 Other symptoms and signs involving the musculoskeletal system: Secondary | ICD-10-CM | POA: Insufficient documentation

## 2015-03-10 NOTE — Therapy (Signed)
[facility] [address]  [place], [place], [address] Phone: [phone]   Fax:  [phone]  Physical Therapy Treatment  Patient Details  Name: Joan [name] MRN: [id number] Date of Birth: 1945/10/04 Referring Provider:  Kary Kos, MD  Encounter Date: 03/10/2015      PT End of Session - 03/10/15 1501    Visit Number 5   Number of Visits 8   PT Start Time 3151   PT Stop Time 1558   PT Time Calculation (min) 63 min      Past Medical History  Diagnosis Date  . Chronic low back pain     followed by Dr Hardin Negus pain mgt  . Hyperlipemia   . Tobacco abuse   . Hypothyroidism   . History of cardiac arrhythmia     cardiologist- Traci Turner  . Esophageal stricture   . Thyroid nodule   . Colon polyp   . PONV (postoperative nausea and vomiting)     Pt reports symptoms are the result of gallbladder and cholecystectomy, not anesthesia  . Heart murmur     "slight; not on RX" (06/24/2013)  . Sleep apnea 1990's    "tested; tried mask; couldn't stand it; told me as long as I slept on my side I'd be ok" (06/24/2013)  . Osteoarthritis of left knee     advanced  . Arthritis     "shoulders; wrist; probably spine" (06/24/2013)  . GERD (gastroesophageal reflux disease)   . Constipation   . Hypertension   . PVC's (premature ventricular contractions)     Past Surgical History  Procedure Laterality Date  . Lumbar fusion      and rods  . Cesarean section  1972  . Ankle surgery Left 1995    "tendon repair" (06/24/2013)  . Infusion pump implantation  1990's    "implantablet morphine pump; took it out w/in 11 months  . Knee arthroscopy Left 1991; ~ 1993  . Elbow surgery  1990's  . Thyroidectomy  2012  . Cervical spine surgery  2012  . Foot surgery Right 2012    SPUR REMOVED  . Shoulder arthroscopy Left 09/2011  . Lumbar laminectomy/decompression microdiscectomy N/A 11/28/2012    Procedure: DECOMPRESSIVE LUMBAR  LAMINECTOMY LEVEL 1;  Surgeon: Elaina Hoops, MD;  Location: [facility];  Service: Neurosurgery;  Laterality: N/A;  DECOMPRESSIVE LUMBAR LAMINECTOMY LEVEL 1  . Back surgery      "think today was my 8th back OR" (06/24/2013)  . Cholecystectomy N/A 04/16/2013    Procedure: LAPAROSCOPIC CHOLECYSTECTOMY ;  Surgeon: Imogene Burn. Georgette Dover, MD;  [facility];  Service: General;  Laterality: N/A;  . Laparoscopic lysis of adhesions N/A 04/16/2013    Procedure: LAPAROSCOPIC LYSIS OF ADHESIONS;  Surgeon: Imogene Burn. Tsuei, MD;  [facility]: [facility];  Service: General;  Laterality: N/A;  . Posterior fusion lumbar spine  06/24/2013  . Abdominal hysterectomy  1988    "partial" (06/24/2013)  . Orif distal radius fracture Left 12/30/2013    dr Caralyn Guile  . Orif wrist fracture Left 12/30/2013    Procedure: OPEN REDUCTION INTERNAL FIXATION (ORIF) LEFT WRIST FRACTURE AND REPAIR AS INDICATED;  Surgeon: Linna Hoff, MD;  Location: [facility];  Service: Orthopedics;  Laterality: Left;  . Robotic assisted salpingo oopherectomy Bilateral 08/20/2014    Procedure: ROBOTIC ASSISTED SALPINGO OOPHORECTOMY;  Surgeon: Daria Pastures, MD;  Location: [facility];  Service: Gynecology;  Laterality: Bilateral;    There were  no vitals filed for this visit.  Visit Diagnosis:  Bilateral thoracic back pain  Shoulder weakness      Subjective Assessment - 03/10/15 1458    Subjective Pt states she hasn't been to PT in past 4 weeks due to being in and out of town so much stating has been able to spend a great deal of time at the beach. States side-lying in past treatments seemed to bother R sciatic nerve and she states she doesn't want to perform any side-lying activities.   Currently in Pain? Yes   Pain Score 5    Pain Location Back   Pain Orientation Mid  mid t-spine pain R>L   Aggravating Factors  bending, twisting, lifting, walking        TODAY'S TREATMENT TherEx - Seated pball rollout 10x Seated t-spine extension with towel  roll to mid t-spine and OP by PT 8x3" (good performance but noted some soreness following this) Manual - STM and grade 1 UPA mobes to B mid t-spine in seated position (fairly well tolerated) TherEX - Hooklying with 2 pillows under head: B Horiz ABD Yellow TB 10x ALT UE Flex with Contra Ext Yellow TB 6x each (notes some B upper scapular soreness with this) Bridge 10x B Shoulder ER Yellow TB with Scap retraction 10x Standing hand on wall shoulder flexion slide 6x each (to promote t-spine extension)  MHP with IFC (80-150Hz ) to B upper L-spine to mid T-spine with pt hooklying x 15'                 PT Short Term Goals - 03/10/15 1609    PT SHORT TERM GOAL #1   Title pt independent in intial HEP   Status Achieved           PT Long Term Goals - 03/10/15 1609    PT LONG TERM GOAL #1   Title pt independent with advanced HEP as necessary by 02/23/15   Status On-going   PT LONG TERM GOAL #2   Title B shoulder MMT 4+/5 or better without c/o back pain with testing by 02/23/15   Status On-going   PT LONG TERM GOAL #3   Title pt reports able to perform chores and ADLs with greater ease without constant need for back brace and rarely limited by back pain by 02/23/15   Status On-going               Plan - 03/10/15 1607    Clinical Impression Statement pt hasn't been seen for 4 weeks due to being out of town great deal and hasn't been seen by this PT since initial eval. Today was her 5th treatment. No changes noted to date.  Pt has been performing HEP given at initial eval but this was only one exercise.  Will progress HEP next treatment.   PT Next Visit Plan progress HEP; t-spine extension/scapular retraction exercises, RC strengthening, stretch to pecs and lats as tolerated; modalities and manual PRN   Consulted and Agree with Plan of Care Patient        Problem List Patient Active Problem List   Diagnosis Date Noted  . Preventative health care 09/08/2014  . Ovarian  cyst 08/13/2014  . Preoperative cardiovascular examination 08/13/2014  . Abnormal EKG 08/13/2014  . PVC's (premature ventricular contractions)   . Fracture of left distal radius 12/30/2013  . Spinal stenosis of lumbar region 06/24/2013  . Atherosclerosis of aorta 05/06/2013  . Post-operative nausea and vomiting 04/29/2013  . Chronic  cholecystitis with calculus 03/16/2013  . Gallstone 03/06/2013  . Osteopenia 03/06/2013  . Edema 11/05/2012  . Laceration 01/09/2012  . Myalgia 04/11/2011  . Night sweats 12/27/2010  . Contact lens/glasses fitting 12/27/2010  . Menopause 12/27/2010  . Family history of breast cancer 12/27/2010  . ADJUSTMENT DISORDER WITH ANXIOUS MOOD 05/26/2010  . PULMONARY NODULE, LEFT UPPER LOBE 12/15/2009  . CONSTIPATION, DRUG INDUCED 08/19/2009  . WEIGHT LOSS, ABNORMAL 06/08/2009  . KNEE PAIN, LEFT 09/13/2008  . THYROID NODULE, LEFT 05/26/2008  . GERD 05/26/2008  . COLONIC POLYPS, ADENOMATOUS, HX OF 05/26/2008  . INSOMNIA 05/06/2008  . DYSPHAGIA UNSPECIFIED 05/06/2008  . ANEMIA, MILD 10/24/2007  . Hypothyroidism 08/12/2007  . Hyperlipidemia 08/12/2007  . TOBACCO ABUSE 08/12/2007  . Essential hypertension 08/12/2007  . DEGENERATIVE JOINT DISEASE, LEFT KNEE 08/12/2007    Adeliz Tonkinson PT, OCS 03/10/2015, 4:10 PM  [facility] [address]  [place], [place], [address] Phone: [phone]   Fax:  [phone]

## 2015-03-11 ENCOUNTER — Other Ambulatory Visit: Payer: Self-pay

## 2015-03-11 MED ORDER — LEVOTHYROXINE SODIUM 88 MCG PO TABS: 88.0000 ug | ORAL_TABLET | Freq: Every day | ORAL | Status: DC

## 2015-03-11 NOTE — Telephone Encounter (Signed)
Rx refill levothyroxine sodium 88 mcg #90  Pharm:  [facility]   Rx sent to pharmacy.

## 2015-03-12 ENCOUNTER — Other Ambulatory Visit: Payer: Self-pay | Admitting: Family Medicine

## 2015-03-15 ENCOUNTER — Ambulatory Visit (AMBULATORY_SURGERY_CENTER): Payer: Self-pay

## 2015-03-15 VITALS — Ht 61.0 in | Wt 153.8 lb

## 2015-03-15 DIAGNOSIS — Z86010 Personal history of colonic polyps: Secondary | ICD-10-CM

## 2015-03-15 NOTE — Progress Notes (Signed)
Pt came into the office today for her pre-visit prior to her colonoscopy with Dr Carlean Purl on 03/28/15.Due to a history of constipation, I spoke with Dr Carlean Purl regarding an extra prep.Verbal order of a clear liquid diet at her evening meal 2 days prior (on 09/17)   and pt to take 4 Dulcolax with a glass of water at 3:00pm on 09/17. Pt was informed to mix 4 capfuls of Miralax into a 32 oz bottle of Gatorade, and  drink a 8 oz glass of the Miralax mixture every 15 minutes until gone per Dr Carlean Purl. Pt was advised to follow the normal Miralax prep as directed on her instructions.Prep instructions were reviewed and questions were answered regarding the extra prep.Pt will call back if she has further questions.    Per pt, no allergies to soy or egg products.Pt not taking any weight loss meds or using  O2 at home.

## 2015-03-16 ENCOUNTER — Telehealth: Payer: Self-pay | Admitting: *Deleted

## 2015-03-16 ENCOUNTER — Ambulatory Visit: Payer: Medicare Other | Admitting: Physical Therapy

## 2015-03-16 DIAGNOSIS — M546 Pain in thoracic spine: Secondary | ICD-10-CM

## 2015-03-16 DIAGNOSIS — R29898 Other symptoms and signs involving the musculoskeletal system: Secondary | ICD-10-CM

## 2015-03-16 MED ORDER — FUROSEMIDE 20 MG PO TABS: ORAL_TABLET | ORAL | Status: DC

## 2015-03-16 NOTE — Therapy (Signed)
[facility] [address]  [place], [place], [address] Phone: [phone]   Fax:  [phone]  Physical Therapy Treatment  Patient Details  Name: Joan [name] MRN: [id number] Date of Birth: Dec 25, 1945 Referring Provider:  Kary Kos, MD  Encounter Date: 03/16/2015      PT End of Session - 03/16/15 1453    Visit Number 6   Number of Visits 8   PT Start Time 2025   PT Stop Time 1553   PT Time Calculation (min) 65 min      Past Medical History  Diagnosis Date  . Chronic low back pain     followed by Dr Hardin Negus pain mgt  . Hyperlipemia   . Tobacco abuse   . Hypothyroidism   . History of cardiac arrhythmia     cardiologist- Traci Turner  . Esophageal stricture   . Thyroid nodule   . Colon polyp   . PONV (postoperative nausea and vomiting)     Pt reports symptoms are the result of gallbladder and cholecystectomy, not anesthesia  . Heart murmur     "slight; not on RX" (06/24/2013)  . Sleep apnea 1990's    "tested; tried mask; couldn't stand it; told me as long as I slept on my side I'd be ok" (06/24/2013)  . Osteoarthritis of left knee     advanced  . Arthritis     "shoulders; wrist; probably spine" (06/24/2013)  . GERD (gastroesophageal reflux disease)   . Constipation   . Hypertension   . PVC's (premature ventricular contractions)   . Finger pain, left     2 fingers on left hand since wrist surgery    Past Surgical History  Procedure Laterality Date  . Lumbar fusion      and rods  . Cesarean section  1972  . Ankle surgery Left 1995    "tendon repair" (06/24/2013)  . Infusion pump implantation  1990's    "implantablet morphine pump; took it out w/in 11 months  . Knee arthroscopy Left 1991; ~ 1993  . Elbow surgery  1990's  . Thyroidectomy  2012  . Cervical spine surgery  2012  . Foot surgery Right 2012    SPUR REMOVED  . Shoulder arthroscopy Left 09/2011  . Lumbar laminectomy/decompression  microdiscectomy N/A 11/28/2012    Procedure: DECOMPRESSIVE LUMBAR LAMINECTOMY LEVEL 1;  Surgeon: Elaina Hoops, MD;  Location: [facility];  Service: Neurosurgery;  Laterality: N/A;  DECOMPRESSIVE LUMBAR LAMINECTOMY LEVEL 1  . Back surgery      "think today was my 8th back OR" (06/24/2013)  . Cholecystectomy N/A 04/16/2013    Procedure: LAPAROSCOPIC CHOLECYSTECTOMY ;  Surgeon: Imogene Burn. Georgette Dover, MD;  Location: [facility];  Service: General;  Laterality: N/A;  . Laparoscopic lysis of adhesions N/A 04/16/2013    Procedure: LAPAROSCOPIC LYSIS OF ADHESIONS;  Surgeon: Imogene Burn. Tsuei, MD;  Location: [facility];  Service: General;  Laterality: N/A;  . Posterior fusion lumbar spine  06/24/2013  . Abdominal hysterectomy  1988    "partial" (06/24/2013)  . Orif distal radius fracture Left 12/30/2013    dr Caralyn Guile  . Orif wrist fracture Left 12/30/2013    Procedure: OPEN REDUCTION INTERNAL FIXATION (ORIF) LEFT WRIST FRACTURE AND REPAIR AS INDICATED;  Surgeon: Linna Hoff, MD;  Location: [facility];  Service: Orthopedics;  Laterality: Left;  . Robotic assisted salpingo oopherectomy Bilateral 08/20/2014    Procedure: ROBOTIC ASSISTED SALPINGO OOPHORECTOMY;  Surgeon: Doyne Keel  Philis Pique, MD;  Location: [facility];  Service: Gynecology;  Laterality: Bilateral;  . Abdominal hysterectomy      partial in 1988    There were no vitals filed for this visit.  Visit Diagnosis:  Bilateral thoracic back pain  Shoulder weakness      Subjective Assessment - 03/16/15 1450    Subjective States was sore after last treatment stating, "not pain, just sore".  States AC went out at home and this increases difficutly with sleeping.  States hasn't been doing much around the house lately due to house being hot.   Currently in Pain? Yes   Pain Score 5   4-5/10 - "not too bad"   Pain Location Back   Pain Orientation Mid  lower t-spine            [facility] PT Assessment - 03/16/15 0001    Assessment   Next MD Visit around 04/30/15         TODAY'S TREATMENT TherEx - Bridge 15x Hooklying B Hip ABD Black TB 15x Hooklying B Horiz ABD Red TB 10x Hooklying B Shoulder Extension (90-0) with TrA with Red TB 10x Hooklying Trunk Rotation isometric with PT pulling Red TB in pt's hands @ 90 shoulder flexion 6x5" each Seated one arm row diagonal double Blue TB 2x10 each B Shoulder ER Red TB with Scap retraction 10x Seated punch diagonal Double Red TB 10x each  3 strips kinesiotape to L and T-spine with horiz strip just below bra line  MHP with IFC (80-150Hz ) to B upper L-spine to mid T-spine with pt hooklying x 15'                       PT Short Term Goals - 03/10/15 1609    PT SHORT TERM GOAL #1   Title pt independent in intial HEP   Status Achieved           PT Long Term Goals - 03/10/15 1609    PT LONG TERM GOAL #1   Title pt independent with advanced HEP as necessary by 02/23/15   Status On-going   PT LONG TERM GOAL #2   Title B shoulder MMT 4+/5 or better without c/o back pain with testing by 02/23/15   Status On-going   PT LONG TERM GOAL #3   Title pt reports able to perform chores and ADLs with greater ease without constant need for back brace and rarely limited by back pain by 02/23/15   Status On-going               Plan - 03/16/15 1551    Clinical Impression Statement pt performed well today and very compliant/motivated today.  Able to progress to more intense trunk stability without limitation by pain during treatment.  Will progress HEP to similar exercises if no delayed increased pain following today's treatment.   PT Next Visit Plan progress HEP with some of today's seated exercises if no increased pain; t-spine extension/scapular retraction exercises, RC strengthening, stretch to pecs and lats as tolerated; modalities and manual PRN   Consulted and Agree with Plan of Care Patient        Problem List Patient Active Problem List   Diagnosis Date Noted  . Preventative  health care 09/08/2014  . Ovarian cyst 08/13/2014  . Preoperative cardiovascular examination 08/13/2014  . Abnormal EKG 08/13/2014  . PVC's (premature ventricular contractions)   . Fracture of left distal radius 12/30/2013  . Spinal stenosis of lumbar  region 06/24/2013  . Atherosclerosis of aorta 05/06/2013  . Post-operative nausea and vomiting 04/29/2013  . Chronic cholecystitis with calculus 03/16/2013  . Gallstone 03/06/2013  . Osteopenia 03/06/2013  . Edema 11/05/2012  . Laceration 01/09/2012  . Myalgia 04/11/2011  . Night sweats 12/27/2010  . Contact lens/glasses fitting 12/27/2010  . Menopause 12/27/2010  . Family history of breast cancer 12/27/2010  . ADJUSTMENT DISORDER WITH ANXIOUS MOOD 05/26/2010  . PULMONARY NODULE, LEFT UPPER LOBE 12/15/2009  . CONSTIPATION, DRUG INDUCED 08/19/2009  . WEIGHT LOSS, ABNORMAL 06/08/2009  . KNEE PAIN, LEFT 09/13/2008  . THYROID NODULE, LEFT 05/26/2008  . GERD 05/26/2008  . COLONIC POLYPS, ADENOMATOUS, HX OF 05/26/2008  . INSOMNIA 05/06/2008  . DYSPHAGIA UNSPECIFIED 05/06/2008  . ANEMIA, MILD 10/24/2007  . Hypothyroidism 08/12/2007  . Hyperlipidemia 08/12/2007  . TOBACCO ABUSE 08/12/2007  . Essential hypertension 08/12/2007  . DEGENERATIVE JOINT DISEASE, LEFT KNEE 08/12/2007    Aziel Morgan PT, OCS 03/16/2015, 3:57 PM  [facility] [address]  [place], [place], [address] Phone: [phone]   Fax:  [phone]

## 2015-03-16 NOTE — Telephone Encounter (Signed)
Pt called and needs a refill of her lasix until her est care appt at [facility]. Her pharmacy is [facility] [place]. Thanks

## 2015-03-17 ENCOUNTER — Other Ambulatory Visit: Payer: Self-pay

## 2015-03-17 ENCOUNTER — Other Ambulatory Visit: Payer: Self-pay | Admitting: Internal Medicine

## 2015-03-17 NOTE — Telephone Encounter (Signed)
The pt called and is hoping to get a refill of her Simvastatin sent through [facility] [phone]

## 2015-03-18 ENCOUNTER — Other Ambulatory Visit: Payer: Self-pay | Admitting: Internal Medicine

## 2015-03-18 MED ORDER — SIMVASTATIN 20 MG PO TABS: ORAL_TABLET | ORAL | Status: DC

## 2015-03-18 NOTE — Telephone Encounter (Signed)
Pt has an appt with dr Etter Sjogren later this month

## 2015-03-18 NOTE — Telephone Encounter (Signed)
Pt needs 1 wk supply send to [facility] on [address] in [place]

## 2015-03-18 NOTE — Telephone Encounter (Signed)
Rx sent to mail order and pharmacy  

## 2015-03-21 ENCOUNTER — Ambulatory Visit: Payer: Medicare Other | Admitting: Physical Therapy

## 2015-03-21 DIAGNOSIS — M546 Pain in thoracic spine: Secondary | ICD-10-CM

## 2015-03-21 DIAGNOSIS — R29898 Other symptoms and signs involving the musculoskeletal system: Secondary | ICD-10-CM

## 2015-03-21 NOTE — Therapy (Signed)
[facility] [address]  [place], [place], [address] Phone: [phone]   Fax:  [phone]  Physical Therapy Treatment  Patient Details  Name: Joan [name] MRN: [id number] Date of Birth: 21-Jan-1946 Referring Provider:  Kary Kos, MD  Encounter Date: 03/21/2015      PT End of Session - 03/21/15 1411    Visit Number 7   Number of Visits 8   PT Start Time 1410   PT Stop Time 8756   PT Time Calculation (min) 68 min      Past Medical History  Diagnosis Date  . Chronic low back pain     followed by Dr Hardin Negus pain mgt  . Hyperlipemia   . Tobacco abuse   . Hypothyroidism   . History of cardiac arrhythmia     cardiologist- Traci Turner  . Esophageal stricture   . Thyroid nodule   . Colon polyp   . PONV (postoperative nausea and vomiting)     Pt reports symptoms are the result of gallbladder and cholecystectomy, not anesthesia  . Heart murmur     "slight; not on RX" (06/24/2013)  . Sleep apnea 1990's    "tested; tried mask; couldn't stand it; told me as long as I slept on my side I'd be ok" (06/24/2013)  . Osteoarthritis of left knee     advanced  . Arthritis     "shoulders; wrist; probably spine" (06/24/2013)  . GERD (gastroesophageal reflux disease)   . Constipation   . Hypertension   . PVC's (premature ventricular contractions)   . Finger pain, left     2 fingers on left hand since wrist surgery    Past Surgical History  Procedure Laterality Date  . Lumbar fusion      and rods  . Cesarean section  1972  . Ankle surgery Left 1995    "tendon repair" (06/24/2013)  . Infusion pump implantation  1990's    "implantablet morphine pump; took it out w/in 11 months  . Knee arthroscopy Left 1991; ~ 1993  . Elbow surgery  1990's  . Thyroidectomy  2012  . Cervical spine surgery  2012  . Foot surgery Right 2012    SPUR REMOVED  . Shoulder arthroscopy Left 09/2011  . Lumbar  laminectomy/decompression microdiscectomy N/A 11/28/2012    Procedure: DECOMPRESSIVE LUMBAR LAMINECTOMY LEVEL 1;  Surgeon: Elaina Hoops, MD;  Location: [facility];  Service: Neurosurgery;  Laterality: N/A;  DECOMPRESSIVE LUMBAR LAMINECTOMY LEVEL 1  . Back surgery      "think today was my 8th back OR" (06/24/2013)  . Cholecystectomy N/A 04/16/2013    Procedure: LAPAROSCOPIC CHOLECYSTECTOMY ;  Surgeon: Imogene Burn. Georgette Dover, MD;  Location: [facility];  Service: General;  Laterality: N/A;  . Laparoscopic lysis of adhesions N/A 04/16/2013    Procedure: LAPAROSCOPIC LYSIS OF ADHESIONS;  Surgeon: Imogene Burn. Tsuei, MD;  Location: [facility];  Service: General;  Laterality: N/A;  . Posterior fusion lumbar spine  06/24/2013  . Abdominal hysterectomy  1988    "partial" (06/24/2013)  . Orif distal radius fracture Left 12/30/2013    dr Caralyn Guile  . Orif wrist fracture Left 12/30/2013    Procedure: OPEN REDUCTION INTERNAL FIXATION (ORIF) LEFT WRIST FRACTURE AND REPAIR AS INDICATED;  Surgeon: Linna Hoff, MD;  Location: [facility];  Service: Orthopedics;  Laterality: Left;  . Robotic assisted salpingo oopherectomy Bilateral 08/20/2014    Procedure: ROBOTIC ASSISTED SALPINGO OOPHORECTOMY;  Surgeon: Doyne Keel  Philis Pique, MD;  Location: [facility];  Service: Gynecology;  Laterality: Bilateral;  . Abdominal hysterectomy      partial in 1988    There were no vitals filed for this visit.  Visit Diagnosis:  Bilateral thoracic back pain  Shoulder weakness      Subjective Assessment - 03/21/15 1418    Subjective States seemed to help from last treatment.  States washed car yesterday which required use of step ladder and was sore following this leading to difficulty sleeping.   Currently in Pain? Yes   Pain Score 4    Pain Location Back   Pain Orientation Mid  lower t-spine           TODAY'S TREATMENT TherEx - Hooklying B Shoulder Extension (90-0) with TrA with Red TB 12x, Green TB 12x Hooklying Trunk Rotation isometric  with PT pulling Red TB in pt's hands @ 90 shoulder flexion 8x8" each Bridge 10x Hooklying B Hip ABD Black TB 15x Hooklying LE March 8x each Seated one arm row diagonal double Black TB 10x each B Shoulder ER Red TB with Scap retraction 15x Standing B Shoulder Extension Red TB 15x Wall Slides 10x (to ~ 75dg bend at knees)  3 strips kinesiotape to L and T-spine with horiz strip just below bra line  MHP with IFC (80-150Hz ) to B upper L-spine to mid T-spine with pt hooklying x 15'                        PT Short Term Goals - 03/10/15 1609    PT SHORT TERM GOAL #1   Title pt independent in intial HEP   Status Achieved           PT Long Term Goals - 03/10/15 1609    PT LONG TERM GOAL #1   Title pt independent with advanced HEP as necessary by 02/23/15   Status On-going   PT LONG TERM GOAL #2   Title B shoulder MMT 4+/5 or better without c/o back pain with testing by 02/23/15   Status On-going   PT LONG TERM GOAL #3   Title pt reports able to perform chores and ADLs with greater ease without constant need for back brace and rarely limited by back pain by 02/23/15   Status On-going               Plan - 03/21/15 1809    Clinical Impression Statement pt states tape seemed to help and she states she felt as though had better posture following last treatment while tape was on.  Tape re-applied today.  Pt performing well with current POC and seems to be making some progress (was able to wash her car over the weekend - did note increased pain but she was able to complete the job).   PT Next Visit Plan progress HEP; assess for MD note/renewal   Consulted and Agree with Plan of Care Patient        Problem List Patient Active Problem List   Diagnosis Date Noted  . Preventative health care 09/08/2014  . Ovarian cyst 08/13/2014  . Preoperative cardiovascular examination 08/13/2014  . Abnormal EKG 08/13/2014  . PVC's (premature ventricular contractions)   .  Fracture of left distal radius 12/30/2013  . Spinal stenosis of lumbar region 06/24/2013  . Atherosclerosis of aorta 05/06/2013  . Post-operative nausea and vomiting 04/29/2013  . Chronic cholecystitis with calculus 03/16/2013  . Gallstone 03/06/2013  . Osteopenia 03/06/2013  .  Edema 11/05/2012  . Laceration 01/09/2012  . Myalgia 04/11/2011  . Night sweats 12/27/2010  . Contact lens/glasses fitting 12/27/2010  . Menopause 12/27/2010  . Family history of breast cancer 12/27/2010  . ADJUSTMENT DISORDER WITH ANXIOUS MOOD 05/26/2010  . PULMONARY NODULE, LEFT UPPER LOBE 12/15/2009  . CONSTIPATION, DRUG INDUCED 08/19/2009  . WEIGHT LOSS, ABNORMAL 06/08/2009  . KNEE PAIN, LEFT 09/13/2008  . THYROID NODULE, LEFT 05/26/2008  . GERD 05/26/2008  . COLONIC POLYPS, ADENOMATOUS, HX OF 05/26/2008  . INSOMNIA 05/06/2008  . DYSPHAGIA UNSPECIFIED 05/06/2008  . ANEMIA, MILD 10/24/2007  . Hypothyroidism 08/12/2007  . Hyperlipidemia 08/12/2007  . TOBACCO ABUSE 08/12/2007  . Essential hypertension 08/12/2007  . DEGENERATIVE JOINT DISEASE, LEFT KNEE 08/12/2007    Lyndy Russman PT, OCS 03/21/2015, 6:14 PM  [facility] [place], [place], [address] Phone: [phone]   Fax:  [phone]

## 2015-03-22 ENCOUNTER — Telehealth: Payer: Self-pay | Admitting: Family Medicine

## 2015-03-22 MED ORDER — SIMVASTATIN 20 MG PO TABS: ORAL_TABLET | ORAL | Status: DC

## 2015-03-22 MED ORDER — CLONAZEPAM 1 MG PO TABS: 1.0000 mg | ORAL_TABLET | Freq: Every day | ORAL | Status: DC

## 2015-03-22 NOTE — Telephone Encounter (Signed)
Refills sent/called in.

## 2015-03-22 NOTE — Telephone Encounter (Signed)
Pt is sch to see dr Sherrine Maples on 04-04-15. Pt is still waiting on simvastatin 20 mg #90 [facility]. Pt also need clonazepam 1 mg #30 send to [facility] [address] in [place]

## 2015-03-23 ENCOUNTER — Ambulatory Visit: Payer: Medicare Other | Admitting: Internal Medicine

## 2015-03-24 ENCOUNTER — Ambulatory Visit: Payer: Medicare Other | Admitting: Physical Therapy

## 2015-03-24 DIAGNOSIS — M546 Pain in thoracic spine: Secondary | ICD-10-CM | POA: Diagnosis not present

## 2015-03-24 DIAGNOSIS — R29898 Other symptoms and signs involving the musculoskeletal system: Secondary | ICD-10-CM

## 2015-03-24 NOTE — Therapy (Signed)
[facility] [address]  [place], [place], [address] Phone: [phone]   Fax:  [phone]  Physical Therapy Treatment  Patient Details  Name: Joan [name] MRN: [id number] Date of Birth: 1945-07-21 Referring Provider:  Kary Kos, MD  Encounter Date: 03/24/2015      PT End of Session - 03/24/15 1536    Visit Number 8   Number of Visits 16   Date for PT Re-Evaluation 04/22/15   PT Start Time 7517   PT Stop Time 0017   PT Time Calculation (min) 67 min      Past Medical History  Diagnosis Date  . Chronic low back pain     followed by Dr Joan [name] pain mgt  . Hyperlipemia   . Tobacco abuse   . Hypothyroidism   . History of cardiac arrhythmia     cardiologist- Joan [name]  . Esophageal stricture   . Thyroid nodule   . Colon polyp   . PONV (postoperative nausea and vomiting)     Pt reports symptoms are the result of gallbladder and cholecystectomy, not anesthesia  . Heart murmur     "slight; not on RX" (06/24/2013)  . Sleep apnea 1990's    "tested; tried mask; couldn't stand it; told me as long as I slept on my side I'd be ok" (06/24/2013)  . Osteoarthritis of left knee     advanced  . Arthritis     "shoulders; wrist; probably spine" (06/24/2013)  . GERD (gastroesophageal reflux disease)   . Constipation   . Hypertension   . PVC's (premature ventricular contractions)   . Finger pain, left     2 fingers on left hand since wrist surgery    Past Surgical History  Procedure Laterality Date  . Lumbar fusion      and rods  . Cesarean section  1972  . Ankle surgery Left 1995    "tendon repair" (06/24/2013)  . Infusion pump implantation  1990's    "implantablet morphine pump; took it out w/in 11 months  . Knee arthroscopy Left 1991; ~ 1993  . Elbow surgery  1990's  . Thyroidectomy  2012  . Cervical spine surgery  2012  . Foot surgery Right 2012    SPUR REMOVED  . Shoulder arthroscopy Left  09/2011  . Lumbar laminectomy/decompression microdiscectomy N/A 11/28/2012    Procedure: DECOMPRESSIVE LUMBAR LAMINECTOMY LEVEL 1;  Surgeon: Joan Hoops, MD;  Location: [name];  Service: Neurosurgery;  Laterality: N/A;  DECOMPRESSIVE LUMBAR LAMINECTOMY LEVEL 1  . Back surgery      "think today was my 8th back OR" (06/24/2013)  . Cholecystectomy N/A 04/16/2013    Procedure: LAPAROSCOPIC CHOLECYSTECTOMY ;  Surgeon: Joan Burn. Georgette Dover, MD;  Location: [name];  Service: General;  Laterality: N/A;  . Laparoscopic lysis of adhesions N/A 04/16/2013    Procedure: LAPAROSCOPIC LYSIS OF ADHESIONS;  Surgeon: Joan Burn. Tsuei, MD;  Location: [name];  Service: General;  Laterality: N/A;  . Posterior fusion lumbar spine  06/24/2013  . Abdominal hysterectomy  1988    "partial" (06/24/2013)  . Orif distal radius fracture Left 12/30/2013    dr Joan [name]  . Orif wrist fracture Left 12/30/2013    Procedure: OPEN REDUCTION INTERNAL FIXATION (ORIF) LEFT WRIST FRACTURE AND REPAIR AS INDICATED;  Surgeon: Linna Hoff, MD;  Location: [facility];  Service: Orthopedics;  Laterality: Left;  . Robotic assisted salpingo oopherectomy Bilateral 08/20/2014    Procedure: ROBOTIC  ASSISTED SALPINGO OOPHORECTOMY;  Surgeon: Joan Pastures, MD;  Location: [name];  Service: Gynecology;  Laterality: Bilateral;  . Abdominal hysterectomy      partial in 1988    There were no vitals filed for this visit.  Visit Diagnosis:  Bilateral thoracic back pain - Plan: PT plan of care cert/re-cert  Shoulder weakness - Plan: PT plan of care cert/re-cert      Subjective Assessment - 03/24/15 1532    Subjective States is more sore today due to staying up late last night and slept in late today so was late on pain medication.  States has had trouble sleeping due to having difficutly getting comfortable.  Reports feeling 40-50% improved since beginning PT.   Currently in Pain? Yes   Pain Score 5    Pain Location Back   Pain Orientation Mid   mid t-spine            TODAY'S TREATMENT TherEx - Bridge 15x Hooklying Pullover 4# 10x Hooklying B Shoulder Extension (90-0) with TrA with Red TB 12x, Green TB 12x B FOB (55cm) gentle LTR 30" B FOB (55cm) LE tuck abdominal exercise 10x Hooklying unilateral Hip ADD with TrA Blue Tb 10x each Hooklying unilateral Hip ABD Blue TB 10x (each leg performed independently while band wrapped around both) Seated one arm row diagonal double Black TB 12x each  3 strips kinesiotape to L and T-spine with horiz strip just below bra line  MHP with IFC (80-150Hz) to B upper L-spine to mid T-spine with pt hooklying x 15'                   PT Short Term Goals - 03/10/15 1609    PT SHORT TERM GOAL #1   Title pt independent in intial HEP   Status Achieved           PT Long Term Goals - 03/24/15 1609    PT LONG TERM GOAL #1   Title pt independent with advanced HEP as necessary by 04/22/15   Status On-going   PT LONG TERM GOAL #2   Title B shoulder MMT 4+/5 or better without c/o back pain with testing by 04/22/15   Status On-going   PT LONG TERM GOAL #3   Title pt reports able to perform chores and ADLs with greater ease without constant need for back brace and rarely limited by back pain by 04/22/15  states continues to wear brace while performing chores stating does so for additional support and injury prevention   Status Partially Met               Plan - 03/25/15 1004    Clinical Impression Statement [name] has attended 8 PT treatments to date.  There was a long lapse in care following her 1st 4 and last 4 treatments due to patient being in and out of town so much per her report.  Overall, she reports feeling 40-50% improvement since beginning PT and wants to continue over the next 4 weeks.  She is displays improved core stability in that able to tolerate B Shoulder Flexion and ABD MMT without c/o back pain and is able to perform a much broader range of  exercises (many of which are lying on her back which is something she had great difficulty with at start of care).  She states she continues to use lumbar brace with most chores due to feeling as though she should to be safe.  At initial  eval this was something she stated she wanted to decrease but it only worn during chores I don't see any problem with continued brace use.  She states she has decreased from using 3 pillows under her head to 2 pillows while sleeping.  I would like to continue progressing Ms. Strahan with PT interventions over the next 4 weeks at 2x/wk.   Pt will benefit from skilled therapeutic intervention in order to improve on the following deficits Pain;Postural dysfunction;Decreased strength;Decreased mobility;Impaired flexibility;Improper body mechanics   Rehab Potential Good   PT Frequency 2x / week   PT Duration 4 weeks   PT Treatment/Interventions Therapeutic exercise;Therapeutic activities;Taping;Manual techniques;Patient/family education;Electrical Stimulation;Moist Heat;Functional mobility training   PT Next Visit Plan progress HEP; continue trunk stability progressions working towards more standing activities as tolerated   Consulted and Agree with Plan of Care Patient        Problem List Patient Active Problem List   Diagnosis Date Noted  . Preventative health care 09/08/2014  . Ovarian cyst 08/13/2014  . Preoperative cardiovascular examination 08/13/2014  . Abnormal EKG 08/13/2014  . PVC's (premature ventricular contractions)   . Fracture of left distal radius 12/30/2013  . Spinal stenosis of lumbar region 06/24/2013  . Atherosclerosis of aorta 05/06/2013  . Post-operative nausea and vomiting 04/29/2013  . Chronic cholecystitis with calculus 03/16/2013  . Gallstone 03/06/2013  . Osteopenia 03/06/2013  . Edema 11/05/2012  . Laceration 01/09/2012  . Myalgia 04/11/2011  . Night sweats 12/27/2010  . Contact lens/glasses fitting 12/27/2010  . Menopause  12/27/2010  . Family history of breast cancer 12/27/2010  . ADJUSTMENT DISORDER WITH ANXIOUS MOOD 05/26/2010  . PULMONARY NODULE, LEFT UPPER LOBE 12/15/2009  . CONSTIPATION, DRUG INDUCED 08/19/2009  . WEIGHT LOSS, ABNORMAL 06/08/2009  . KNEE PAIN, LEFT 09/13/2008  . THYROID NODULE, LEFT 05/26/2008  . GERD 05/26/2008  . COLONIC POLYPS, ADENOMATOUS, HX OF 05/26/2008  . INSOMNIA 05/06/2008  . DYSPHAGIA UNSPECIFIED 05/06/2008  . ANEMIA, MILD 10/24/2007  . Hypothyroidism 08/12/2007  . Hyperlipidemia 08/12/2007  . TOBACCO ABUSE 08/12/2007  . Essential hypertension 08/12/2007  . DEGENERATIVE JOINT DISEASE, LEFT KNEE 08/12/2007    HALL,RALPH PT, OCS 03/25/2015, 10:15 AM  [facility] [place], [place], [address] Phone: [phone]   Fax:  [phone]

## 2015-03-25 ENCOUNTER — Other Ambulatory Visit: Payer: Self-pay

## 2015-03-25 DIAGNOSIS — Z1231 Encounter for screening mammogram for malignant neoplasm of breast: Secondary | ICD-10-CM

## 2015-03-28 ENCOUNTER — Ambulatory Visit (AMBULATORY_SURGERY_CENTER): Payer: Medicare Other | Admitting: Internal Medicine

## 2015-03-28 ENCOUNTER — Encounter: Payer: Self-pay | Admitting: Internal Medicine

## 2015-03-28 VITALS — BP 128/74 | HR 62 | Temp 98.9°F | Resp 49 | Ht 61.0 in | Wt 153.0 lb

## 2015-03-28 DIAGNOSIS — D120 Benign neoplasm of cecum: Secondary | ICD-10-CM | POA: Diagnosis not present

## 2015-03-28 DIAGNOSIS — D123 Benign neoplasm of transverse colon: Secondary | ICD-10-CM

## 2015-03-28 DIAGNOSIS — Z86010 Personal history of colonic polyps: Secondary | ICD-10-CM

## 2015-03-28 MED ORDER — SODIUM CHLORIDE 0.9 % IV SOLN: 500.0000 mL | INTRAVENOUS | Status: DC

## 2015-03-28 NOTE — Progress Notes (Signed)
Transferred to recovery room. A/O x3, pleased with MAC.  VSS.  Report to Suzanne, RN. 

## 2015-03-28 NOTE — Patient Instructions (Signed)
YOU HAD AN ENDOSCOPIC PROCEDURE TODAY AT [facility]:   Refer to the procedure report that was given to you for any specific questions about what was found during the examination.  If the procedure report does not answer your questions, please call your gastroenterologist to clarify.  If you requested that your care partner not be given the details of your procedure findings, then the procedure report has been included in a sealed envelope for you to review at your convenience later.  YOU SHOULD EXPECT: Some feelings of bloating in the abdomen. Passage of more gas than usual.  Walking can help get rid of the air that was put into your GI tract during the procedure and reduce the bloating. If you had a lower endoscopy (such as a colonoscopy or flexible sigmoidoscopy) you may notice spotting of blood in your stool or on the toilet paper. If you underwent a bowel prep for your procedure, you may not have a normal bowel movement for a few days.  Please Note:  You might notice some irritation and congestion in your nose or some drainage.  This is from the oxygen used during your procedure.  There is no need for concern and it should clear up in a day or so.  SYMPTOMS TO REPORT IMMEDIATELY:   Following lower endoscopy (colonoscopy or flexible sigmoidoscopy):  Excessive amounts of blood in the stool  Significant tenderness or worsening of abdominal pains  Swelling of the abdomen that is new, acute  Fever of 100F or higher   For urgent or emergent issues, a gastroenterologist can be reached at any hour by calling [phone].   DIET: Your first meal following the procedure should be a small meal and then it is ok to progress to your normal diet. Heavy or fried foods are harder to digest and may make you feel nauseous or bloated.  Likewise, meals heavy in dairy and vegetables can increase bloating.  Drink plenty of fluids but you should avoid alcoholic beverages for 24  hours.  ACTIVITY:  You should plan to take it easy for the rest of today and you should NOT DRIVE or use heavy machinery until tomorrow (because of the sedation medicines used during the test).    FOLLOW UP: Our staff will call the number listed on your records the next business day following your procedure to check on you and address any questions or concerns that you may have regarding the information given to you following your procedure. If we do not reach you, we will leave a message.  However, if you are feeling well and you are not experiencing any problems, there is no need to return our call.  We will assume that you have returned to your regular daily activities without incident.  If any biopsies were taken you will be contacted by phone or by letter within the next 1-3 weeks.  Please call us at [phone] if you have not heard about the biopsies in 3 weeks.    SIGNATURES/CONFIDENTIALITY: You and/or your care partner have signed paperwork which will be entered into your electronic medical record.  These signatures attest to the fact that that the information above on your After Visit Summary has been reviewed and is understood.  Full responsibility of the confidentiality of this discharge information lies with you and/or your care-partner.  Please, read a ll of the hand outs given to you by your recovery room nurse.

## 2015-03-28 NOTE — Progress Notes (Signed)
Called to room to assist during endoscopic procedure.  Patient ID and intended procedure confirmed with present staff. Received instructions for my participation in the procedure from the performing physician.  

## 2015-03-28 NOTE — Op Note (Signed)
[facility]  [address] [place], [address]   COLONOSCOPY PROCEDURE REPORT  PATIENT: Joan [name], Joan [name]  MR#: [id number] BIRTHDATE: [birth year] , 1  yrs. old GENDER: female ENDOSCOPIST: Gatha Mayer, MD, [facility] PROCEDURE DATE:  03/28/2015 PROCEDURE:   Colonoscopy, surveillance and Colonoscopy with snare polypectomy First Screening Colonoscopy - Avg.  risk and is 50 yrs.  old or older - No.  Prior Negative Screening - Now for repeat screening. N/A  History of Adenoma - Now for follow-up colonoscopy & has been > or = to 3 yrs.  Yes hx of adenoma.  Has been 3 or more years since last colonoscopy.  Polyps removed today? Yes ASA CLASS:   Class II INDICATIONS:Surveillance due to prior colonic neoplasia and PH Colon Adenoma. MEDICATIONS: Propofol 250 mg IV and Monitored anesthesia care  DESCRIPTION OF PROCEDURE:   After the risks benefits and alternatives of the procedure were thoroughly explained, informed consent was obtained.  The digital rectal exam revealed no abnormalities of the rectum.   The LB [id number]  endoscope was introduced through the anus and advanced to the cecum, which was identified by both the appendix and ileocecal valve. No adverse events experienced.   The quality of the prep was excellent. (MiraLax double prepwas used)  The instrument was then slowly withdrawn as the colon was fully examined. Estimated blood loss is zero unless otherwise noted in this procedure report.   COLON FINDINGS: Two polypoid shaped polyps ranging from 4 to 52mm in size were found at the cecum and in the transverse colon. Polypectomies were performed with a cold snare.  The resection was complete, the polyp tissue was completely retrieved and sent to histology.   There was diverticulosis noted in the sigmoid colon. The examination was otherwise normal.  Retroflexed views revealed no abnormalities. The time to cecum = 3.1 Withdrawal time = 9.3 The scope was  withdrawn and the procedure completed. COMPLICATIONS: There were no immediate complications.  ENDOSCOPIC IMPRESSION: 1.   Two polyps ranging from 4 to 60mm in size were found at the cecum and in the transverse colon; polypectomies were performed with a cold snare 2.   Diverticulosis was noted in the sigmoid colon 3.   The examination was otherwise normal  RECOMMENDATIONS: Timing of repeat colonoscopy will be determined by pathology findings.  She has hx adernomas 2006 and 2011 - needs extra prep again if colonoscopy  eSigned:  Gatha Mayer, MD, [facility] 03/28/2015 10:19 AM   cc: The Patient

## 2015-03-29 ENCOUNTER — Telehealth: Payer: Self-pay | Admitting: *Deleted

## 2015-03-29 NOTE — Telephone Encounter (Signed)
  Follow up Call-no answer, left message to call if questions or concerns.     

## 2015-03-30 ENCOUNTER — Ambulatory Visit: Payer: Medicare Other | Admitting: Physical Therapy

## 2015-03-30 DIAGNOSIS — R29898 Other symptoms and signs involving the musculoskeletal system: Secondary | ICD-10-CM

## 2015-03-30 DIAGNOSIS — M546 Pain in thoracic spine: Secondary | ICD-10-CM | POA: Diagnosis not present

## 2015-03-30 NOTE — Therapy (Signed)
[facility] [address]  [place], [place], [address] Phone: [phone]   Fax:  [phone]  Physical Therapy Treatment  Patient Details  Name: Joan [name] MRN: [id number] Date of Birth: 04/27/1946 Referring Provider:  Kary Kos, MD  Encounter Date: 03/30/2015      PT End of Session - 03/30/15 1448    Visit Number 9   Number of Visits 16   Date for PT Re-Evaluation 04/22/15   PT Start Time 3295   PT Stop Time 1884   PT Time Calculation (min) 62 min      Past Medical History  Diagnosis Date  . Chronic low back pain     followed by Dr Hardin Negus pain mgt  . Hyperlipemia   . Tobacco abuse   . Hypothyroidism   . History of cardiac arrhythmia     cardiologist- Traci Turner  . Esophageal stricture   . Thyroid nodule   . Colon polyp   . PONV (postoperative nausea and vomiting)     Pt reports symptoms are the result of gallbladder and cholecystectomy, not anesthesia  . Heart murmur     "slight; not on RX" (06/24/2013)  . Sleep apnea 1990's    "tested; tried mask; couldn't stand it; told me as long as I slept on my side I'd be ok" (06/24/2013)  . Osteoarthritis of left knee     advanced  . Arthritis     "shoulders; wrist; probably spine" (06/24/2013)  . GERD (gastroesophageal reflux disease)   . Constipation   . Hypertension   . PVC's (premature ventricular contractions)   . Finger pain, left     2 fingers on left hand since wrist surgery  . Hx of colonic polyps 08/28/2004    Past Surgical History  Procedure Laterality Date  . Lumbar fusion      and rods  . Cesarean section  1972  . Ankle surgery Left 1995    "tendon repair" (06/24/2013)  . Infusion pump implantation  1990's    "implantablet morphine pump; took it out w/in 11 months  . Knee arthroscopy Left 1991; ~ 1993  . Elbow surgery  1990's  . Thyroidectomy  2012  . Cervical spine surgery  2012  . Foot surgery Right 2012    SPUR  REMOVED  . Shoulder arthroscopy Left 09/2011  . Lumbar laminectomy/decompression microdiscectomy N/A 11/28/2012    Procedure: DECOMPRESSIVE LUMBAR LAMINECTOMY LEVEL 1;  Surgeon: Elaina Hoops, MD;  Location: [facility];  Service: Neurosurgery;  Laterality: N/A;  DECOMPRESSIVE LUMBAR LAMINECTOMY LEVEL 1  . Back surgery      "think today was my 8th back OR" (06/24/2013)  . Cholecystectomy N/A 04/16/2013    Procedure: LAPAROSCOPIC CHOLECYSTECTOMY ;  Surgeon: Imogene Burn. Georgette Dover, MD;  Location: [facility];  Service: General;  Laterality: N/A;  . Laparoscopic lysis of adhesions N/A 04/16/2013    Procedure: LAPAROSCOPIC LYSIS OF ADHESIONS;  Surgeon: Imogene Burn. Tsuei, MD;  Location: [facility];  Service: General;  Laterality: N/A;  . Posterior fusion lumbar spine  06/24/2013  . Abdominal hysterectomy  1988    "partial" (06/24/2013)  . Orif distal radius fracture Left 12/30/2013    dr Caralyn Guile  . Orif wrist fracture Left 12/30/2013    Procedure: OPEN REDUCTION INTERNAL FIXATION (ORIF) LEFT WRIST FRACTURE AND REPAIR AS INDICATED;  Surgeon: Linna Hoff, MD;  Location: [facility];  Service: Orthopedics;  Laterality: Left;  . Robotic assisted salpingo oopherectomy  Bilateral 08/20/2014    Procedure: ROBOTIC ASSISTED SALPINGO OOPHORECTOMY;  Surgeon: Daria Pastures, MD;  Location: [facility];  Service: Gynecology;  Laterality: Bilateral;  . Abdominal hysterectomy      partial in 1988    There were no vitals filed for this visit.  Visit Diagnosis:  Bilateral thoracic back pain  Shoulder weakness      Subjective Assessment - 03/30/15 1450    Subjective Pt had colonoscopy 2 days ago so has been resting more than typical past 2 days.  Today, she states she was working in garage earlier today and is sore from this and is nearly due to next pain med dose. States felt really good after last treatment.   Currently in Pain? Yes   Pain Score 5    Pain Location Back   Pain Orientation Mid          TODAY'S  TREATMENT TherEx - Bridge 10x Hooklying unilateral Hip ADD with TrA Blue Tb 12x, Black 12x each Hooklying unilateral Hip ABD Blue TB 12x, Black TB 12x (each leg performed independently while band wrapped around both) B FOB (55cm) gentle LTR 30" B FOB (55cm) LE tuck with B Shoulder Extension 90-0 with Green TB 2x10  Standing at [employer]: Hip ABD 10x each, Hip Ext 10x each Standing B Shoulder Flexed with 3# in hands CW and CCW with core contraction Standing Rapid Hip Flex/ABD/Ext combo with Yellow TB at ankles 5x each  MHP with IFC (80-150Hz) to B upper L-spine to mid T-spine with pt hooklying x 15'  3 strips kinesiotape to L and T-spine with horiz strip just below bra line                    PT Education - 03/30/15 1544    Education provided Yes   Education Details HEP update   Person(s) Educated Patient   Methods Explanation;Demonstration;Handout   Comprehension Returned demonstration;Verbalized understanding          PT Short Term Goals - 03/10/15 1609    PT SHORT TERM GOAL #1   Title pt independent in intial HEP   Status Achieved           PT Long Term Goals - 03/30/15 1552    PT LONG TERM GOAL #1   Title pt independent with advanced HEP as necessary by 04/22/15  instructed in what may be final HEP progression   Status Partially Met               Plan - 03/30/15 1548    Clinical Impression Statement HEP updated today and pt seems to like new HEP.  States always feels better following treatments.   PT Next Visit Plan continue trunk stability progressions working towards more standing activities as tolerated   Consulted and Agree with Plan of Care Patient        Problem List Patient Active Problem List   Diagnosis Date Noted  . Preventative health care 09/08/2014  . Ovarian cyst 08/13/2014  . Preoperative cardiovascular examination 08/13/2014  . Abnormal EKG 08/13/2014  . PVC's (premature ventricular contractions)   . Fracture  of left distal radius 12/30/2013  . Spinal stenosis of lumbar region 06/24/2013  . Atherosclerosis of aorta 05/06/2013  . Osteopenia 03/06/2013  . Edema 11/05/2012  . Myalgia 04/11/2011  . Night sweats 12/27/2010  . Contact lens/glasses fitting 12/27/2010  . Menopause 12/27/2010  . Family history of breast cancer 12/27/2010  . ADJUSTMENT DISORDER WITH ANXIOUS MOOD  05/26/2010  . PULMONARY NODULE, LEFT UPPER LOBE 12/15/2009  . CONSTIPATION, DRUG INDUCED 08/19/2009  . KNEE PAIN, LEFT 09/13/2008  . THYROID NODULE, LEFT 05/26/2008  . GERD 05/26/2008  . INSOMNIA 05/06/2008  . ANEMIA, MILD 10/24/2007  . Hypothyroidism 08/12/2007  . Hyperlipidemia 08/12/2007  . TOBACCO ABUSE 08/12/2007  . Essential hypertension 08/12/2007  . DEGENERATIVE JOINT DISEASE, LEFT KNEE 08/12/2007  . Hx of colonic polyps 08/28/2004    [facility] PT, OCS 03/30/2015, 3:53 PM  [facility] [address]  [place], [place], [address] Phone: [phone]   Fax:  [phone]

## 2015-04-04 ENCOUNTER — Ambulatory Visit (INDEPENDENT_AMBULATORY_CARE_PROVIDER_SITE_OTHER): Payer: Medicare Other | Admitting: Family Medicine

## 2015-04-04 ENCOUNTER — Encounter: Payer: Self-pay | Admitting: Family Medicine

## 2015-04-04 VITALS — BP 122/66 | HR 65 | Temp 98.8°F | Ht 61.0 in | Wt 157.0 lb

## 2015-04-04 DIAGNOSIS — M8580 Other specified disorders of bone density and structure, unspecified site: Secondary | ICD-10-CM

## 2015-04-04 DIAGNOSIS — E785 Hyperlipidemia, unspecified: Secondary | ICD-10-CM

## 2015-04-04 DIAGNOSIS — D649 Anemia, unspecified: Secondary | ICD-10-CM | POA: Diagnosis not present

## 2015-04-04 DIAGNOSIS — Z23 Encounter for immunization: Secondary | ICD-10-CM | POA: Diagnosis not present

## 2015-04-04 DIAGNOSIS — Z0000 Encounter for general adult medical examination without abnormal findings: Secondary | ICD-10-CM

## 2015-04-04 DIAGNOSIS — I10 Essential (primary) hypertension: Secondary | ICD-10-CM | POA: Diagnosis not present

## 2015-04-04 DIAGNOSIS — S46912A Strain of unspecified muscle, fascia and tendon at shoulder and upper arm level, left arm, initial encounter: Secondary | ICD-10-CM

## 2015-04-04 DIAGNOSIS — E039 Hypothyroidism, unspecified: Secondary | ICD-10-CM

## 2015-04-04 MED ORDER — POTASSIUM CHLORIDE CRYS ER 20 MEQ PO TBCR: EXTENDED_RELEASE_TABLET | ORAL | Status: DC

## 2015-04-04 MED ORDER — CLONAZEPAM 1 MG PO TABS: 1.0000 mg | ORAL_TABLET | Freq: Every day | ORAL | Status: DC

## 2015-04-04 MED ORDER — FUROSEMIDE 20 MG PO TABS: ORAL_TABLET | ORAL | Status: DC

## 2015-04-04 MED ORDER — OMEPRAZOLE 40 MG PO CPDR: 40.0000 mg | DELAYED_RELEASE_CAPSULE | Freq: Every day | ORAL | Status: DC

## 2015-04-04 NOTE — Patient Instructions (Addendum)

## 2015-04-04 NOTE — Progress Notes (Signed)
Patient ID: Joan [name], female   DOB: [birth year], 69 y.o.   MRN: [id number]   Subjective:    Patient ID: Joan [name], female    DOB: 1945/09/01, 69 y.o.   MRN: [id number]  Chief Complaint  Patient presents with  . Establish care    HPI Patient is in today for first time.  She is establishing here today and needs refills on meds.  No new complaints.    Past Medical History  Diagnosis Date  . Chronic low back pain     followed by Dr Hardin Negus pain mgt  . Hyperlipemia   . Tobacco abuse   . Hypothyroidism   . History of cardiac arrhythmia     cardiologist- Traci Turner  . Esophageal stricture   . Thyroid nodule   . Colon polyp   . PONV (postoperative nausea and vomiting)     Pt reports symptoms are the result of gallbladder and cholecystectomy, not anesthesia  . Heart murmur     "slight; not on RX" (06/24/2013)  . Sleep apnea 1990's    "tested; tried mask; couldn't stand it; told me as long as I slept on my side I'd be ok" (06/24/2013)  . Osteoarthritis of left knee     advanced  . Arthritis     "shoulders; wrist; probably spine" (06/24/2013)  . GERD (gastroesophageal reflux disease)   . Constipation   . Hypertension   . PVC's (premature ventricular contractions)   . Finger pain, left     2 fingers on left hand since wrist surgery  . Hx of colonic polyps 08/28/2004    Past Surgical History  Procedure Laterality Date  . Lumbar fusion      and rods  . Cesarean section  1972  . Ankle surgery Left 1995    "tendon repair" (06/24/2013)  . Infusion pump implantation  1990's    "implantablet morphine pump; took it out w/in 11 months  . Knee arthroscopy Left 1991; ~ 1993  . Elbow surgery  1990's  . Thyroidectomy  2012  . Cervical spine surgery  2012  . Foot surgery Right 2012    SPUR REMOVED  . Shoulder arthroscopy Left 09/2011  . Lumbar laminectomy/decompression microdiscectomy N/A 11/28/2012    Procedure: DECOMPRESSIVE LUMBAR LAMINECTOMY LEVEL 1;  Surgeon: Elaina Hoops, MD;  Location: [facility];  Service: Neurosurgery;  Laterality: N/A;  DECOMPRESSIVE LUMBAR LAMINECTOMY LEVEL 1  . Back surgery      "think today was my 8th back OR" (06/24/2013)  . Cholecystectomy N/A 04/16/2013    Procedure: LAPAROSCOPIC CHOLECYSTECTOMY ;  Surgeon: Imogene Burn. Georgette Dover, MD;  Location: [facility];  Service: General;  Laterality: N/A;  . Laparoscopic lysis of adhesions N/A 04/16/2013    Procedure: LAPAROSCOPIC LYSIS OF ADHESIONS;  Surgeon: Imogene Burn. Tsuei, MD;  Location: [facility];  Service: General;  Laterality: N/A;  . Posterior fusion lumbar spine  06/24/2013  . Abdominal hysterectomy  1988    "partial" (06/24/2013)  . Orif distal radius fracture Left 12/30/2013    dr Caralyn Guile  . Orif wrist fracture Left 12/30/2013    Procedure: OPEN REDUCTION INTERNAL FIXATION (ORIF) LEFT WRIST FRACTURE AND REPAIR AS INDICATED;  Surgeon: Linna Hoff, MD;  Location: [facility];  Service: Orthopedics;  Laterality: Left;  . Robotic assisted salpingo oopherectomy Bilateral 08/20/2014    Procedure: ROBOTIC ASSISTED SALPINGO OOPHORECTOMY;  Surgeon: Daria Pastures, MD;  Location: [facility];  Service: Gynecology;  Laterality: Bilateral;  . Abdominal hysterectomy  partial in 1988    Family History  Problem Relation Age of Onset  . Pancreatic cancer Father     deceased age 75  . Cancer Father   . Diabetes type II Mother     deceased age 77  . Hypertension Mother   . Coronary artery disease Mother   . Diabetes Mother   . Cancer Mother     Thyroid and Skin  . Diabetes Sister   . Hypertension Sister   . Cancer Sister     Breast  . Bone cancer Sister   . Breast cancer Sister   . Other Neg Hx     No family history of  colon cancer  . Hypertension Brother     Social History   Social History  . Marital Status: Divorced    Spouse Name: N/A  . Number of Children: N/A  . Years of Education: N/A   Occupational History  . Not on file.   Social History Main Topics  . Smoking status:  Current Every Day Smoker -- 1.00 packs/day for 35 years    Types: Cigarettes  . Smokeless tobacco: Never Used  . Alcohol Use: No  . Drug Use: No  . Sexual Activity: Not Currently   Other Topics Concern  . Not on file   Social History Narrative   Divorced   Current Smoker  1 ppd -  20 yrs      Alcohol use-no       [employer] - laid off       Physician roster:   Dr. Elta Guadeloupe Philips - pain management   Dr. Philis Pique - GYN   Dr. Saintclair Halsted - Neurosurgery   Dr. Ronnald Ramp - dermatology   Dr. Caralyn Guile - orthopedics   Dr. Fransico Him - cardiology   Dr. Miller-ophthalmology    Outpatient Prescriptions Prior to Visit  Medication Sig Dispense Refill  . bisacodyl (DULCOLAX) 5 MG EC tablet Take 5-10 mg by mouth at bedtime as needed for mild constipation (depending on amount of pain medication taken).  30 tablet   . carisoprodol (SOMA) 350 MG tablet Take 350 mg by mouth 3 (three) times daily. For muscle spasms    . Cholecalciferol (VITAMIN D3) 5000 UNITS TABS Take 5,000 Units by mouth daily.     . Cyanocobalamin (VITAMIN B-12) 2500 MCG SUBL Place 2,500 mcg under the tongue daily.     Marland Kitchen estradiol (ESTRACE) 0.5 MG tablet TAKE 1 BY MOUTH DAILY 90 tablet 0  . glucosamine-chondroitin 500-400 MG tablet Take 2 tablets by mouth daily.    . hydrochlorothiazide (HYDRODIURIL) 25 MG tablet TAKE 1 BY MOUTH DAILY 90 tablet 0  . HYDROcodone-acetaminophen (NORCO) 10-325 MG per tablet Take 1 tablet by mouth every 4 (four) hours. For pain 30 tablet 0  . levothyroxine (SYNTHROID, LEVOTHROID) 88 MCG tablet Take 1 tablet (88 mcg total) by mouth daily before breakfast. 90 tablet 2  . magnesium gluconate (MAGONATE) 500 MG tablet Take 500 mg by mouth daily.    . NON FORMULARY Super b energy complex-Take one daily    . simvastatin (ZOCOR) 20 MG tablet TAKE 1 BY MOUTH AT BEDTIME 90 tablet 0  . triamcinolone cream (KENALOG) 0.1 % Apply 1 application topically daily.     . vitamin C (ASCORBIC ACID) 500 MG tablet  Take 1 tablet (500 mg total) by mouth daily. 50 tablet 0  . clonazePAM (KLONOPIN) 1 MG tablet Take 1 tablet (1 mg total) by mouth at bedtime. 30 tablet  0  . furosemide (LASIX) 20 MG tablet TAKE ONE TABLET BY MOUTH ONCE DAILY AS NEEDED FOR  FLUID 30 tablet 0  . omeprazole (PRILOSEC) 40 MG capsule Take 1 capsule (40 mg total) by mouth daily. 90 capsule 0  . potassium chloride SA (K-DUR,KLOR-CON) 20 MEQ tablet TAKE 3 TABLETS (60MEQ) BY MOUTH DAILY 270 tablet 0  . bisacodyl (DULCOLAX) 5 MG EC tablet Take 5 mg by mouth. Dulcolax 5 mg bowel prep #8-Take as directed     No facility-administered medications prior to visit.    Allergies  Allergen Reactions  . Morphine And Related Swelling  . Amitriptyline Other (See Comments)    Leg swelling  . Gabapentin Itching    neurontin  . Triamterene Itching and Rash    Review of Systems  Constitutional: Negative for fever and malaise/fatigue.  HENT: Negative for congestion.   Eyes: Negative for discharge.  Respiratory: Negative for shortness of breath.   Cardiovascular: Negative for chest pain, palpitations and leg swelling.  Gastrointestinal: Negative for nausea and abdominal pain.  Genitourinary: Negative for dysuria.  Musculoskeletal: Positive for back pain. Negative for falls.  Skin: Negative for rash.  Neurological: Negative for loss of consciousness and headaches.  Endo/Heme/Allergies: Negative for environmental allergies.  Psychiatric/Behavioral: Negative for depression. The patient is not nervous/anxious.        Objective:    Physical Exam  Constitutional: She is oriented to person, place, and time. She appears well-developed and well-nourished.  HENT:  Head: Normocephalic and atraumatic.  Eyes: Conjunctivae and EOM are normal.  Neck: Normal range of motion. Neck supple. No JVD present. Carotid bruit is not present. No thyromegaly present.  Cardiovascular: Normal rate, regular rhythm and normal heart sounds.   No murmur  heard. Pulmonary/Chest: Effort normal and breath sounds normal. No respiratory distress. She has no wheezes. She has no rales. She exhibits no tenderness.  Musculoskeletal: She exhibits no edema.  Neurological: She is alert and oriented to person, place, and time.  Psychiatric: She has a normal mood and affect. Her behavior is normal.  Nursing note and vitals reviewed.   BP 122/66 mmHg  Pulse 65  Temp(Src) 98.8 F (37.1 C) (Oral)  Ht 5\' 1"  (1.549 m)  Wt 157 lb (71.215 kg)  BMI 29.68 kg/m2  SpO2 100% Wt Readings from Last 3 Encounters:  04/04/15 157 lb (71.215 kg)  03/28/15 153 lb (69.4 kg)  03/15/15 153 lb 12.8 oz (69.763 kg)     Lab Results  Component Value Date   WBC 5.4 08/11/2014   HGB 13.5 08/11/2014   HCT 40.0 08/11/2014   PLT 203 08/11/2014   GLUCOSE 100* 08/31/2014   CHOL 220* 08/31/2014   TRIG 198.0* 08/31/2014   HDL 61.10 08/31/2014   LDLDIRECT 78.9 05/06/2008   LDLCALC 119* 08/31/2014   ALT 8 08/31/2014   AST 12 08/31/2014   NA 137 08/31/2014   K 3.9 08/31/2014   CL 103 08/31/2014   CREATININE 0.70 08/31/2014   BUN 10 08/31/2014   CO2 31 08/31/2014   TSH 0.37 03/01/2014   INR 0.98 10/26/2010    Lab Results  Component Value Date   TSH 0.37 03/01/2014   Lab Results  Component Value Date   WBC 5.4 08/11/2014   HGB 13.5 08/11/2014   HCT 40.0 08/11/2014   MCV 92.0 08/11/2014   PLT 203 08/11/2014   Lab Results  Component Value Date   NA 137 08/31/2014   K 3.9 08/31/2014   CO2 31 08/31/2014  GLUCOSE 100* 08/31/2014   BUN 10 08/31/2014   CREATININE 0.70 08/31/2014   BILITOT 0.3 08/31/2014   ALKPHOS 81 08/31/2014   AST 12 08/31/2014   ALT 8 08/31/2014   PROT 6.4 08/31/2014   ALBUMIN 3.9 08/31/2014   CALCIUM 8.6 08/31/2014   ANIONGAP 6 08/11/2014   GFR 87.98 08/31/2014   Lab Results  Component Value Date   CHOL 220* 08/31/2014   Lab Results  Component Value Date   HDL 61.10 08/31/2014   Lab Results  Component Value Date    LDLCALC 119* 08/31/2014   Lab Results  Component Value Date   TRIG 198.0* 08/31/2014   Lab Results  Component Value Date   CHOLHDL 4 08/31/2014   No results found for: HGBA1C     Assessment & Plan:   Problem List Items Addressed This Visit    Preventative health care   Relevant Orders   Hepatitis C antibody   Osteopenia   Relevant Orders   CBC   DG Bone Density   Hypothyroidism   Hyperlipidemia   Relevant Medications   furosemide (LASIX) 20 MG tablet   Other Relevant Orders   Lipid panel   Essential hypertension   Relevant Medications   furosemide (LASIX) 20 MG tablet   Other Relevant Orders   Comprehensive metabolic panel   CBC   Anemia   Relevant Orders   CBC    Other Visit Diagnoses    Left shoulder strain, initial encounter    -  Primary    Need for prophylactic vaccination and inoculation against influenza        Relevant Orders    Flu vaccine HIGH DOSE PF (Fluzone High dose) (Completed)       I am having Ms. Brake maintain her carisoprodol, bisacodyl, Vitamin B-12, Vitamin D3, glucosamine-chondroitin, triamcinolone cream, vitamin C, HYDROcodone-acetaminophen, hydrochlorothiazide, estradiol, levothyroxine, magnesium gluconate, NON FORMULARY, simvastatin, timolol, Omega-3 Fatty Acids (FISH OIL PO), clonazePAM, furosemide, potassium chloride SA, and omeprazole.  Meds ordered this encounter  Medications  . timolol (BETIMOL) 0.25 % ophthalmic solution    Sig: Place 2 drops into the left eye 2 (two) times daily.  . Omega-3 Fatty Acids (FISH OIL PO)    Sig: Take 360 mg by mouth daily.  . clonazePAM (KLONOPIN) 1 MG tablet    Sig: Take 1 tablet (1 mg total) by mouth at bedtime.    Dispense:  30 tablet    Refill:  0  . furosemide (LASIX) 20 MG tablet    Sig: TAKE ONE TABLET BY MOUTH ONCE DAILY AS NEEDED FOR  FLUID    Dispense:  30 tablet    Refill:  0  . potassium chloride SA (K-DUR,KLOR-CON) 20 MEQ tablet    Sig: TAKE 3 TABLETS (60MEQ) BY MOUTH DAILY     Dispense:  270 tablet    Refill:  1  . omeprazole (PRILOSEC) 40 MG capsule    Sig: Take 1 capsule (40 mg total) by mouth daily.    Dispense:  90 capsule    Refill:  [place], DO

## 2015-04-04 NOTE — Progress Notes (Signed)
Pre visit review using our clinic review tool, if applicable. No additional management support is needed unless otherwise documented below in the visit note. 

## 2015-04-05 ENCOUNTER — Ambulatory Visit: Payer: Medicare Other | Admitting: Physical Therapy

## 2015-04-05 DIAGNOSIS — M546 Pain in thoracic spine: Secondary | ICD-10-CM | POA: Diagnosis not present

## 2015-04-05 DIAGNOSIS — R29898 Other symptoms and signs involving the musculoskeletal system: Secondary | ICD-10-CM

## 2015-04-05 LAB — COMPREHENSIVE METABOLIC PANEL
ALT: 10 U/L (ref 0–35)
AST: 15 U/L (ref 0–37)
Albumin: 4.2 g/dL (ref 3.5–5.2)
Alkaline Phosphatase: 82 U/L (ref 39–117)
BUN: 19 mg/dL (ref 6–23)
CO2: 33 mEq/L — ABNORMAL HIGH (ref 19–32)
Calcium: 8.6 mg/dL (ref 8.4–10.5)
Chloride: 101 mEq/L (ref 96–112)
Creatinine, Ser: 0.8 mg/dL (ref 0.40–1.20)
GFR: 75.28 mL/min (ref >60.00)
Glucose, Bld: 91 mg/dL (ref 70–99)
Potassium: 4 mEq/L (ref 3.5–5.1)
Sodium: 140 mEq/L (ref 135–145)
Total Bilirubin: 0.3 mg/dL (ref 0.2–1.2)
Total Protein: 6.6 g/dL (ref 6.0–8.3)

## 2015-04-05 LAB — CBC
HCT: 38.2 % (ref 36.0–46.0)
Hemoglobin: 12.8 g/dL (ref 12.0–15.0)
MCHC: 33.5 g/dL (ref 30.0–36.0)
MCV: 91.6 fl (ref 78.0–100.0)
Platelets: 221 10*3/uL (ref 150.0–400.0)
RBC: 4.17 Mil/uL (ref 3.87–5.11)
RDW: 13.5 % (ref 11.5–15.5)
WBC: 4.4 10*3/uL (ref 4.0–10.5)

## 2015-04-05 LAB — LIPID PANEL
Cholesterol: 195 mg/dL (ref 0–200)
HDL: 58.4 mg/dL (ref >39.00)
NonHDL: 136.7
Total CHOL/HDL Ratio: 3
Triglycerides: 245 mg/dL — ABNORMAL HIGH (ref 0.0–149.0)
VLDL: 49 mg/dL — ABNORMAL HIGH (ref 0.0–40.0)

## 2015-04-05 LAB — HEPATITIS C ANTIBODY: HCV Ab: NEGATIVE

## 2015-04-05 LAB — LDL CHOLESTEROL, DIRECT: Direct LDL: 98 mg/dL

## 2015-04-05 NOTE — Therapy (Signed)
[facility] [address]  [place], [place], [address] Phone: [phone]   Fax:  [phone]  Physical Therapy Treatment  Patient Details  Name: Joan [name] MRN: [id number] Date of Birth: July 17, 1945 Referring Provider:  Kary Kos, MD  Encounter Date: 04/05/2015      PT End of Session - 04/05/15 1439    Visit Number 10   Number of Visits 16   Date for PT Re-Evaluation 04/22/15   PT Start Time 6599   PT Stop Time 1540   PT Time Calculation (min) 62 min      Past Medical History  Diagnosis Date  . Chronic low back pain     followed by Dr Hardin Negus pain mgt  . Hyperlipemia   . Tobacco abuse   . Hypothyroidism   . History of cardiac arrhythmia     cardiologist- Traci Turner  . Esophageal stricture   . Thyroid nodule   . Colon polyp   . PONV (postoperative nausea and vomiting)     Pt reports symptoms are the result of gallbladder and cholecystectomy, not anesthesia  . Heart murmur     "slight; not on RX" (06/24/2013)  . Sleep apnea 1990's    "tested; tried mask; couldn't stand it; told me as long as I slept on my side I'd be ok" (06/24/2013)  . Osteoarthritis of left knee     advanced  . Arthritis     "shoulders; wrist; probably spine" (06/24/2013)  . GERD (gastroesophageal reflux disease)   . Constipation   . Hypertension   . PVC's (premature ventricular contractions)   . Finger pain, left     2 fingers on left hand since wrist surgery  . Hx of colonic polyps 08/28/2004    Past Surgical History  Procedure Laterality Date  . Lumbar fusion      and rods  . Cesarean section  1972  . Ankle surgery Left 1995    "tendon repair" (06/24/2013)  . Infusion pump implantation  1990's    "implantablet morphine pump; took it out w/in 11 months  . Knee arthroscopy Left 1991; ~ 1993  . Elbow surgery  1990's  . Thyroidectomy  2012  . Cervical spine surgery  2012  . Foot surgery Right 2012    SPUR  REMOVED  . Shoulder arthroscopy Left 09/2011  . Lumbar laminectomy/decompression microdiscectomy N/A 11/28/2012    Procedure: DECOMPRESSIVE LUMBAR LAMINECTOMY LEVEL 1;  Surgeon: Elaina Hoops, MD;  Location: [facility];  Service: Neurosurgery;  Laterality: N/A;  DECOMPRESSIVE LUMBAR LAMINECTOMY LEVEL 1  . Back surgery      "think today was my 8th back OR" (06/24/2013)  . Cholecystectomy N/A 04/16/2013    Procedure: LAPAROSCOPIC CHOLECYSTECTOMY ;  Surgeon: Imogene Burn. Georgette Dover, MD;  Location: [facility];  Service: General;  Laterality: N/A;  . Laparoscopic lysis of adhesions N/A 04/16/2013    Procedure: LAPAROSCOPIC LYSIS OF ADHESIONS;  Surgeon: Imogene Burn. Tsuei, MD;  Location: [facility];  Service: General;  Laterality: N/A;  . Posterior fusion lumbar spine  06/24/2013  . Abdominal hysterectomy  1988    "partial" (06/24/2013)  . Orif distal radius fracture Left 12/30/2013    dr Caralyn Guile  . Orif wrist fracture Left 12/30/2013    Procedure: OPEN REDUCTION INTERNAL FIXATION (ORIF) LEFT WRIST FRACTURE AND REPAIR AS INDICATED;  Surgeon: Linna Hoff, MD;  Location: [facility];  Service: Orthopedics;  Laterality: Left;  . Robotic assisted salpingo oopherectomy  Bilateral 08/20/2014    Procedure: ROBOTIC ASSISTED SALPINGO OOPHORECTOMY;  Surgeon: Daria Pastures, MD;  Location: [facility];  Service: Gynecology;  Laterality: Bilateral;  . Abdominal hysterectomy      partial in 1988    There were no vitals filed for this visit.  Visit Diagnosis:  Bilateral thoracic back pain  Shoulder weakness      Subjective Assessment - 2015-04-13 1439    Subjective pt states she is feeling good so far today rating pain 3/10.  She states she has been performing updated HEP but states is only able to perform sets of 5 reps with standing exercises due to B knee pain (R>L).   Currently in Pain? Yes   Pain Score 3    Pain Location Back            [facility] PT Assessment - 04/13/2015 0001    Observation/Other Assessments   Focus on  Therapeutic Outcomes (FOTO)  42% limitation           TODAY'S TREATMENT TherEx - Bridge 12x Hooklying unilateral Hip ADD with TrA Black 2x12 Hooklying unilateral Hip ABD Black TB 2x12 (each leg performed independently while band wrapped around both) B FOB (55cm) gentle LTR 30" B FOB (55cm) LE tuck with B Shoulder Pullover 5# 12x Staggered Standing One Arm Row Double Black TB 15x each Standing Double hand low row Double Black TB 15x Narrow Standing Trunk Rotation Stab Double Red TB 6x3" Seated Trunk isometrics 6x5" Flex, Ext, and B Rotation  MHP with IFC (80-150Hz) to B upper L-spine to mid T-spine with pt hooklying x 15'  3 strips kinesiotape to L and T-spine with horiz strip just below bra line                    PT Short Term Goals - 03/10/15 1609    PT SHORT TERM GOAL #1   Title pt independent in intial HEP   Status Achieved           PT Long Term Goals - 03/30/15 1552    PT LONG TERM GOAL #1   Title pt independent with advanced HEP as necessary by 04/22/15  instructed in what may be final HEP progression   Status Partially Met               Plan - 04-13-2015 1522    Clinical Impression Statement good progress, has been performing progressed HEP but has to decrease standing reps to 5 due to B knee pain.  Good performance today without c/o increased Back pain and states feels good at end of treatment.   PT Next Visit Plan continue trunk stability progressions working towards more standing activities as tolerated          G-Codes - 04/13/15 1616    Functional Assessment Tool Used foto 42% limitation   Functional Limitation Mobility: Walking and moving around   Mobility: Walking and Moving Around Current Status (H2122) At least 40 percent but less than 60 percent impaired, limited or restricted   Mobility: Walking and Moving Around Goal Status [phone]) At least 20 percent but less than 40 percent impaired, limited or restricted       Problem List Patient Active Problem List   Diagnosis Date Noted  . Preventative health care 09/08/2014  . Ovarian cyst 08/13/2014  . Preoperative cardiovascular examination 08/13/2014  . Abnormal EKG 08/13/2014  . PVC's (premature ventricular contractions)   . Fracture of left distal radius 12/30/2013  .  Spinal stenosis of lumbar region 06/24/2013  . Atherosclerosis of aorta 05/06/2013  . Osteopenia 03/06/2013  . Edema 11/05/2012  . Myalgia 04/11/2011  . Night sweats 12/27/2010  . Contact lens/glasses fitting 12/27/2010  . Menopause 12/27/2010  . Family history of breast cancer 12/27/2010  . ADJUSTMENT DISORDER WITH ANXIOUS MOOD 05/26/2010  . PULMONARY NODULE, LEFT UPPER LOBE 12/15/2009  . CONSTIPATION, DRUG INDUCED 08/19/2009  . KNEE PAIN, LEFT 09/13/2008  . THYROID NODULE, LEFT 05/26/2008  . GERD 05/26/2008  . INSOMNIA 05/06/2008  . Anemia 10/24/2007  . Hypothyroidism 08/12/2007  . Hyperlipidemia 08/12/2007  . TOBACCO ABUSE 08/12/2007  . Essential hypertension 08/12/2007  . DEGENERATIVE JOINT DISEASE, LEFT KNEE 08/12/2007  . Hx of colonic polyps 08/28/2004    [facility] PT, OCS 04/05/2015, 4:17 PM  [facility] [address]  [place], [place], [address] Phone: [phone]   Fax:  [phone]

## 2015-04-06 ENCOUNTER — Encounter: Payer: Self-pay | Admitting: Internal Medicine

## 2015-04-06 DIAGNOSIS — Z860100 Personal history of colon polyps, unspecified: Secondary | ICD-10-CM

## 2015-04-06 NOTE — Progress Notes (Signed)
Quick Note:  2 adenomas max 7 mm Repeat colonoscopy 2021 - extra prep ______

## 2015-04-07 ENCOUNTER — Ambulatory Visit: Payer: Medicare Other | Admitting: Physical Therapy

## 2015-04-07 DIAGNOSIS — M546 Pain in thoracic spine: Secondary | ICD-10-CM

## 2015-04-07 DIAGNOSIS — R29898 Other symptoms and signs involving the musculoskeletal system: Secondary | ICD-10-CM

## 2015-04-07 NOTE — Therapy (Signed)
[facility] [address]  [place], [place], [address] Phone: [phone]   Fax:  [phone]  Physical Therapy Treatment  Patient Details  Name: Joan [name] MRN: [id number] Date of Birth: 02-14-1946 Referring Provider:  Kary Kos, MD  Encounter Date: 04/07/2015      PT End of Session - 04/07/15 1453    Visit Number 11   Number of Visits 16   Date for PT Re-Evaluation 04/22/15   PT Start Time 6063   PT Stop Time 1555   PT Time Calculation (min) 64 min      Past Medical History  Diagnosis Date  . Chronic low back pain     followed by Dr Hardin Negus pain mgt  . Hyperlipemia   . Tobacco abuse   . Hypothyroidism   . History of cardiac arrhythmia     cardiologist- Traci Turner  . Esophageal stricture   . Thyroid nodule   . Colon polyp   . PONV (postoperative nausea and vomiting)     Pt reports symptoms are the result of gallbladder and cholecystectomy, not anesthesia  . Heart murmur     "slight; not on RX" (06/24/2013)  . Sleep apnea 1990's    "tested; tried mask; couldn't stand it; told me as long as I slept on my side I'd be ok" (06/24/2013)  . Osteoarthritis of left knee     advanced  . Arthritis     "shoulders; wrist; probably spine" (06/24/2013)  . GERD (gastroesophageal reflux disease)   . Constipation   . Hypertension   . PVC's (premature ventricular contractions)   . Finger pain, left     2 fingers on left hand since wrist surgery  . Hx of colonic polyps 08/28/2004    Past Surgical History  Procedure Laterality Date  . Lumbar fusion      and rods  . Cesarean section  1972  . Ankle surgery Left 1995    "tendon repair" (06/24/2013)  . Infusion pump implantation  1990's    "implantablet morphine pump; took it out w/in 11 months  . Knee arthroscopy Left 1991; ~ 1993  . Elbow surgery  1990's  . Thyroidectomy  2012  . Cervical spine surgery  2012  . Foot surgery Right 2012    SPUR  REMOVED  . Shoulder arthroscopy Left 09/2011  . Lumbar laminectomy/decompression microdiscectomy N/A 11/28/2012    Procedure: DECOMPRESSIVE LUMBAR LAMINECTOMY LEVEL 1;  Surgeon: Elaina Hoops, MD;  Location: [facility];  Service: Neurosurgery;  Laterality: N/A;  DECOMPRESSIVE LUMBAR LAMINECTOMY LEVEL 1  . Back surgery      "think today was my 8th back OR" (06/24/2013)  . Cholecystectomy N/A 04/16/2013    Procedure: LAPAROSCOPIC CHOLECYSTECTOMY ;  Surgeon: Imogene Burn. Georgette Dover, MD;  [facility];  Service: General;  Laterality: N/A;  . Laparoscopic lysis of adhesions N/A 04/16/2013    Procedure: LAPAROSCOPIC LYSIS OF ADHESIONS;  Surgeon: Imogene Burn. Tsuei, MD;  Location: [facility];  Service: General;  Laterality: N/A;  . Posterior fusion lumbar spine  06/24/2013  . Abdominal hysterectomy  1988    "partial" (06/24/2013)  . Orif distal radius fracture Left 12/30/2013    dr Caralyn Guile  . Orif wrist fracture Left 12/30/2013    Procedure: OPEN REDUCTION INTERNAL FIXATION (ORIF) LEFT WRIST FRACTURE AND REPAIR AS INDICATED;  Surgeon: Linna Hoff, MD;  Location: [facility];  Service: Orthopedics;  Laterality: Left;  . Robotic assisted salpingo oopherectomy  Bilateral 08/20/2014    Procedure: ROBOTIC ASSISTED SALPINGO OOPHORECTOMY;  Surgeon: Daria Pastures, MD;  Location: [facility];  Service: Gynecology;  Laterality: Bilateral;  . Abdominal hysterectomy      partial in 1988    There were no vitals filed for this visit.  Visit Diagnosis:  Bilateral thoracic back pain  Shoulder weakness      Subjective Assessment - 04/07/15 1453    Subjective States worked in yard using pressure washer to clean fence and sidewalk and is tired today but no increase in pain.  States did use back brace while doing this.  Had difficulty sleeping last night following this and tired today.  Current pain 3/10.   Currently in Pain? Yes   Pain Score 3    Pain Location Back   Pain Orientation Mid             TODAY'S  TREATMENT TherEx - Bridge 12x Hooklying unilateral Hip ADD with TrA Black 2x12 Hooklying unilateral Hip ABD Black TB 2x12 (each leg performed independently while band wrapped around both) Stretch B SKTC, HS, Piriformis (very tight on L) B FOB (55cm) LE tuck with B Shoulder Extension Green TB 12x B FOB (55cm) gentle LTR 30" Single FOB (55cm) with contra UE Green TB diagonal "crunch" 10x each Seated Hip ABD Black TB 15x each Seated one Arm Row Double Black TB 15x each Seated punch diagonal Double Green TB 12x each  MHP with IFC (80-150Hz ) to B upper L-spine to mid T-spine with pt hooklying x 15'  3 strips kinesiotape to L and T-spine with horiz strip just below bra line                      PT Short Term Goals - 03/10/15 1609    PT SHORT TERM GOAL #1   Title pt independent in intial HEP   Status Achieved           PT Long Term Goals - 03/30/15 1552    PT LONG TERM GOAL #1   Title pt independent with advanced HEP as necessary by 04/22/15  instructed in what may be final HEP progression   Status Partially Met               Plan - 04/07/15 1553    Clinical Impression Statement pt seems to be progressing with level of function in that has been doing more around the house and yard lately (washing car with step ladder, pressure washing fence and sidewalk) without noting large increase in pain.  Pt rates pain 3/10 today and performed very good with all exercises without c/o increased back pain.   PT Next Visit Plan continue trunk stability progressions working towards more standing activities as tolerated   Consulted and Agree with Plan of Care Patient        Problem List Patient Active Problem List   Diagnosis Date Noted  . Preventative health care 09/08/2014  . Ovarian cyst 08/13/2014  . Preoperative cardiovascular examination 08/13/2014  . Abnormal EKG 08/13/2014  . PVC's (premature ventricular contractions)   . Fracture of left distal radius  12/30/2013  . Spinal stenosis of lumbar region 06/24/2013  . Atherosclerosis of aorta 05/06/2013  . Osteopenia 03/06/2013  . Edema 11/05/2012  . Myalgia 04/11/2011  . Night sweats 12/27/2010  . Contact lens/glasses fitting 12/27/2010  . Menopause 12/27/2010  . Family history of breast cancer 12/27/2010  . ADJUSTMENT DISORDER WITH ANXIOUS MOOD 05/26/2010  . PULMONARY  NODULE, LEFT UPPER LOBE 12/15/2009  . CONSTIPATION, DRUG INDUCED 08/19/2009  . KNEE PAIN, LEFT 09/13/2008  . THYROID NODULE, LEFT 05/26/2008  . GERD 05/26/2008  . INSOMNIA 05/06/2008  . Anemia 10/24/2007  . Hypothyroidism 08/12/2007  . Hyperlipidemia 08/12/2007  . TOBACCO ABUSE 08/12/2007  . Essential hypertension 08/12/2007  . DEGENERATIVE JOINT DISEASE, LEFT KNEE 08/12/2007  . Hx of colonic polyps 08/28/2004    [facility] PT, OCS 04/07/2015, 3:56 PM  [facility] [address]  [place], [place], [address] Phone: [phone]   Fax:  [phone]

## 2015-04-08 ENCOUNTER — Other Ambulatory Visit: Payer: Self-pay

## 2015-04-08 MED ORDER — FENOFIBRATE 160 MG PO TABS: 160.0000 mg | ORAL_TABLET | Freq: Every day | ORAL | Status: DC

## 2015-04-12 ENCOUNTER — Ambulatory Visit: Payer: Medicare Other | Attending: Neurosurgery | Admitting: Physical Therapy

## 2015-04-12 DIAGNOSIS — R29898 Other symptoms and signs involving the musculoskeletal system: Secondary | ICD-10-CM | POA: Insufficient documentation

## 2015-04-12 DIAGNOSIS — M546 Pain in thoracic spine: Secondary | ICD-10-CM

## 2015-04-12 NOTE — Therapy (Signed)
[facility] [address]  [place], [place], [address] Phone: [phone]   Fax:  [phone]  Physical Therapy Treatment  Patient Details  Name: Joan [name] MRN: [id number] Date of Birth: 03-20-1946 Referring Provider:  Kary Kos, MD  Encounter Date: 04/12/2015      PT End of Session - 04/12/15 1447    Visit Number 12   Number of Visits 16   Date for PT Re-Evaluation 04/22/15   PT Start Time 8563   PT Stop Time 1550   PT Time Calculation (min) 64 min      Past Medical History  Diagnosis Date  . Chronic low back pain     followed by Dr Hardin Negus pain mgt  . Hyperlipemia   . Tobacco abuse   . Hypothyroidism   . History of cardiac arrhythmia     cardiologist- Traci Turner  . Esophageal stricture   . Thyroid nodule   . Colon polyp   . PONV (postoperative nausea and vomiting)     Pt reports symptoms are the result of gallbladder and cholecystectomy, not anesthesia  . Heart murmur     "slight; not on RX" (06/24/2013)  . Sleep apnea 1990's    "tested; tried mask; couldn't stand it; told me as long as I slept on my side I'd be ok" (06/24/2013)  . Osteoarthritis of left knee     advanced  . Arthritis     "shoulders; wrist; probably spine" (06/24/2013)  . GERD (gastroesophageal reflux disease)   . Constipation   . Hypertension   . PVC's (premature ventricular contractions)   . Finger pain, left     2 fingers on left hand since wrist surgery  . Hx of colonic polyps 08/28/2004    Past Surgical History  Procedure Laterality Date  . Lumbar fusion      and rods  . Cesarean section  1972  . Ankle surgery Left 1995    "tendon repair" (06/24/2013)  . Infusion pump implantation  1990's    "implantablet morphine pump; took it out w/in 11 months  . Knee arthroscopy Left 1991; ~ 1993  . Elbow surgery  1990's  . Thyroidectomy  2012  . Cervical spine surgery  2012  . Foot surgery Right 2012    SPUR  REMOVED  . Shoulder arthroscopy Left 09/2011  . Lumbar laminectomy/decompression microdiscectomy N/A 11/28/2012    Procedure: DECOMPRESSIVE LUMBAR LAMINECTOMY LEVEL 1;  Surgeon: Elaina Hoops, MD;  Location: [facility];  Service: Neurosurgery;  Laterality: N/A;  DECOMPRESSIVE LUMBAR LAMINECTOMY LEVEL 1  . Back surgery      "think today was my 8th back OR" (06/24/2013)  . Cholecystectomy N/A 04/16/2013    Procedure: LAPAROSCOPIC CHOLECYSTECTOMY ;  Surgeon: Imogene Burn. Georgette Dover, MD;  [facility];  Service: General;  Laterality: N/A;  . Laparoscopic lysis of adhesions N/A 04/16/2013    Procedure: LAPAROSCOPIC LYSIS OF ADHESIONS;  Surgeon: Imogene Burn. Tsuei, MD;  [facility];  Service: General;  Laterality: N/A;  . Posterior fusion lumbar spine  06/24/2013  . Abdominal hysterectomy  1988    "partial" (06/24/2013)  . Orif distal radius fracture Left 12/30/2013    dr Caralyn Guile  . Orif wrist fracture Left 12/30/2013    Procedure: OPEN REDUCTION INTERNAL FIXATION (ORIF) LEFT WRIST FRACTURE AND REPAIR AS INDICATED;  Surgeon: Linna Hoff, MD;  Location: [facility];  Service: Orthopedics;  Laterality: Left;  . Robotic assisted salpingo oopherectomy  Bilateral 08/20/2014    Procedure: ROBOTIC ASSISTED SALPINGO OOPHORECTOMY;  Surgeon: Daria Pastures, MD;  Location: [facility];  Service: Gynecology;  Laterality: Bilateral;  . Abdominal hysterectomy      partial in 1988    There were no vitals filed for this visit.  Visit Diagnosis:  Bilateral thoracic back pain  Shoulder weakness      Subjective Assessment - 04/12/15 1447    Subjective States completed using pressure washer over the weekend and noted some R shoulder as well as back pain from this.  States she completed work on Friday and had to rest over the weekend due to [place]. States was still able to perform HEP.   Currently in Pain? Yes   Pain Score 3    Pain Location Back   Pain Orientation Mid            TODAY'S  TREATMENT TherEx - Bridge 15x Stretch B SKTC, HS, Piriformis (tight on L and notes pulling pain to mid t-spine) Hooklying Reverse Curl-up 10x Single FOB (55cm) with contra UE Green TB diagonal "crunch" 10x each FOB (55cm) Gentle LTR 30" Hooklying unilateral Hip ADD with TrA Black 15x Hooklying unilateral Hip ABD Black TB 15x (each leg performed independently while band wrapped around both) Single Hand Low Row 15# 10x each Seated B Shoulder Flexion with 2kg ball 10x (focus on upright posture) Seated diagonals 2kg ball 10x each  MHP with IFC (80-150Hz) to B upper L-spine to mid T-spine with pt hooklying x 15'  3 strips kinesiotape to L and T-spine with horiz strip just below bra line                       PT Short Term Goals - 03/10/15 1609    PT SHORT TERM GOAL #1   Title pt independent in intial HEP   Status Achieved           PT Long Term Goals - 03/30/15 1552    PT LONG TERM GOAL #1   Title pt independent with advanced HEP as necessary by 04/22/15  instructed in what may be final HEP progression   Status Partially Met               Plan - 04/12/15 1532    Clinical Impression Statement pt worked with pressure washer 3 wednesday - friday last week and sates noted some increase in back and shoulder pain due to this.  States rested most of the weekend and pain now down to 3/10 today.  Overall, this seems like good progress in terms of level of function.  Her exercises were progressed today in clinic and no c/o back pain with any of this. She seems to be nearing her discharge goals.   PT Next Visit Plan continue trunk stability progressions working towards more standing activities as tolerated   Consulted and Agree with Plan of Care Patient        Problem List Patient Active Problem List   Diagnosis Date Noted  . Preventative health care 09/08/2014  . Ovarian cyst 08/13/2014  . Preoperative cardiovascular examination 08/13/2014  . Abnormal EKG  08/13/2014  . PVC's (premature ventricular contractions)   . Fracture of left distal radius 12/30/2013  . Spinal stenosis of lumbar region 06/24/2013  . Atherosclerosis of aorta ([place]) 05/06/2013  . Osteopenia 03/06/2013  . Edema 11/05/2012  . Myalgia 04/11/2011  . Night sweats 12/27/2010  . Contact lens/glasses fitting 12/27/2010  . Menopause 12/27/2010  .  Family history of breast cancer 12/27/2010  . ADJUSTMENT DISORDER WITH ANXIOUS MOOD 05/26/2010  . PULMONARY NODULE, LEFT UPPER LOBE 12/15/2009  . CONSTIPATION, DRUG INDUCED 08/19/2009  . KNEE PAIN, LEFT 09/13/2008  . THYROID NODULE, LEFT 05/26/2008  . GERD 05/26/2008  . INSOMNIA 05/06/2008  . Anemia 10/24/2007  . Hypothyroidism 08/12/2007  . Hyperlipidemia 08/12/2007  . TOBACCO ABUSE 08/12/2007  . Essential hypertension 08/12/2007  . DEGENERATIVE JOINT DISEASE, LEFT KNEE 08/12/2007  . Hx of colonic polyps 08/28/2004    [facility] 04/12/2015, 3:51 PM  [facility] [address]  [place], [place], [address] Phone: [phone]   Fax:  [phone]

## 2015-04-15 ENCOUNTER — Telehealth: Payer: Self-pay | Admitting: Family Medicine

## 2015-04-15 NOTE — Telephone Encounter (Signed)
Ask her to bring in or check her formulary online--- so we can pick one they will pay for

## 2015-04-15 NOTE — Telephone Encounter (Signed)
Which medication ?

## 2015-04-15 NOTE — Telephone Encounter (Signed)
Caller name: Sonda Coppens   Relationship to patient: Self   Can be reached: [phone]  Pharmacy: [facility] [place], [place]   Reason for call: Pt says that the pharmacy will not send her the Rx because they are stating that they need more information. Pt isn't sure of the information needed. She says that this medication is to expensive. She cant afford it. She want to know if there is something else that can be prescribed instead?  Also, pt says that she need a Thyroid test.

## 2015-04-15 NOTE — Telephone Encounter (Signed)
Im so sorry, fenofibrate 160 MG tablet

## 2015-04-20 ENCOUNTER — Telehealth: Payer: Self-pay

## 2015-04-20 ENCOUNTER — Ambulatory Visit: Payer: Medicare Other | Admitting: Physical Therapy

## 2015-04-20 ENCOUNTER — Other Ambulatory Visit: Payer: Self-pay | Admitting: Internal Medicine

## 2015-04-20 DIAGNOSIS — M546 Pain in thoracic spine: Secondary | ICD-10-CM

## 2015-04-20 DIAGNOSIS — R29898 Other symptoms and signs involving the musculoskeletal system: Secondary | ICD-10-CM

## 2015-04-20 DIAGNOSIS — E039 Hypothyroidism, unspecified: Secondary | ICD-10-CM

## 2015-04-20 MED ORDER — FUROSEMIDE 20 MG PO TABS: ORAL_TABLET | ORAL | Status: DC

## 2015-04-20 NOTE — Telephone Encounter (Signed)
Clonazepam refill request.  Last seen 09/08/2014.  Last filled 04/04/2015.  Please advise.

## 2015-04-20 NOTE — Telephone Encounter (Signed)
Discussed with patient and she agreed to getting her formulary. She stated that we did not give her a refill on her Furosemide, Klonopin and we did not send the Bone Density to the correct place so she canceled it. She went on to say her TSH was not drawn and it should have and she continued to emphasized all that was not done during her visit. I made her aware I would fax the order to the correct place and I am not responsible for what happens during the visit, but apologized. I made her aware the Furosemide would be faxed with refills, and the klonopin will need to go to [facility] for approval, she will need to come back into the office to have her TSH rechecked. Order placed for TSH, BMD order faxed to [facility] and the patient is requesting Klonopin   Last seen 04/04/15 as a new patient. Please advise     KP

## 2015-04-20 NOTE — Therapy (Signed)
[facility] [address]  [place], [place], [address] Phone: [phone]   Fax:  [phone]  Physical Therapy Treatment  Patient Details  Name: Joan [name] MRN: [id number] Date of Birth: [birth year] Referring Provider:  Kary Kos, MD  Encounter Date: 04/20/2015      PT End of Session - 04/20/15 1450    Visit Number 13   Number of Visits 16   Date for PT Re-Evaluation 04/22/15   PT Start Time 7672   PT Stop Time 1554   PT Time Calculation (min) 63 min      Past Medical History  Diagnosis Date  . Chronic low back pain     followed by Dr Hardin Negus pain mgt  . Hyperlipemia   . Tobacco abuse   . Hypothyroidism   . History of cardiac arrhythmia     cardiologist- Traci Turner  . Esophageal stricture   . Thyroid nodule   . Colon polyp   . PONV (postoperative nausea and vomiting)     Pt reports symptoms are the result of gallbladder and cholecystectomy, not anesthesia  . Heart murmur     "slight; not on RX" (06/24/2013)  . Sleep apnea 1990's    "tested; tried mask; couldn't stand it; told me as long as I slept on my side I'd be ok" (06/24/2013)  . Osteoarthritis of left knee     advanced  . Arthritis     "shoulders; wrist; probably spine" (06/24/2013)  . GERD (gastroesophageal reflux disease)   . Constipation   . Hypertension   . PVC's (premature ventricular contractions)   . Finger pain, left     2 fingers on left hand since wrist surgery  . Hx of colonic polyps 08/28/2004    Past Surgical History  Procedure Laterality Date  . Lumbar fusion      and rods  . Cesarean section  1972  . Ankle surgery Left 1995    "tendon repair" (06/24/2013)  . Infusion pump implantation  1990's    "implantablet morphine pump; took it out w/in 11 months  . Knee arthroscopy Left 1991; ~ 1993  . Elbow surgery  1990's  . Thyroidectomy  2012  . Cervical spine surgery  2012  . Foot surgery Right 2012    SPUR  REMOVED  . Shoulder arthroscopy Left 09/2011  . Lumbar laminectomy/decompression microdiscectomy N/A 11/28/2012    Procedure: DECOMPRESSIVE LUMBAR LAMINECTOMY LEVEL 1;  Surgeon: Elaina Hoops, MD;  Location: [facility];  Service: Neurosurgery;  Laterality: N/A;  DECOMPRESSIVE LUMBAR LAMINECTOMY LEVEL 1  . Back surgery      "think today was my 8th back OR" (06/24/2013)  . Cholecystectomy N/A 04/16/2013    Procedure: LAPAROSCOPIC CHOLECYSTECTOMY ;  Surgeon: Imogene Burn. Georgette Dover, MD;  Location: [facility];  Service: General;  Laterality: N/A;  . Laparoscopic lysis of adhesions N/A 04/16/2013    Procedure: LAPAROSCOPIC LYSIS OF ADHESIONS;  Surgeon: Imogene Burn. Tsuei, MD;  Location: [facility];  Service: General;  Laterality: N/A;  . Posterior fusion lumbar spine  06/24/2013  . Abdominal hysterectomy  1988    "partial" (06/24/2013)  . Orif distal radius fracture Left 12/30/2013    dr Caralyn Guile  . Orif wrist fracture Left 12/30/2013    Procedure: OPEN REDUCTION INTERNAL FIXATION (ORIF) LEFT WRIST FRACTURE AND REPAIR AS INDICATED;  Surgeon: Linna Hoff, MD;  Location: [facility];  Service: Orthopedics;  Laterality: Left;  . Robotic assisted salpingo oopherectomy  Bilateral 08/20/2014    Procedure: ROBOTIC ASSISTED SALPINGO OOPHORECTOMY;  Surgeon: Daria Pastures, MD;  Location: [facility];  Service: Gynecology;  Laterality: Bilateral;  . Abdominal hysterectomy      partial in 1988    There were no vitals filed for this visit.  Visit Diagnosis:  Bilateral thoracic back pain  Shoulder weakness      Subjective Assessment - 04/20/15 1452    Subjective states saw surgeon last week and is pleased with progress stating she doesn't need to return unless hsa problem.  In addition she has wrist scheduled in 2 weeks on L wrist to remove hardware.  Pt is sore today stating she has been staining fence past 3 days for a couple hours/day.  States is still only taking pain meds every 4 hours.   Currently in Pain? Yes   Pain Score  3    Pain Location Back   Pain Orientation Mid   Pain Descriptors / Indicators Sore           TODAY'S TREATMENT TherEx - Bridge 15x Hooklying unilateral Hip ADD with TrA Black 18x Hooklying unilateral Hip ABD Black TB 15x (each leg performed independently while band wrapped around both) Stretch B SKTC, HS, Piriformis (tight on L and notes pulling pain to mid t-spine) Hooklying Reverse Curl-up 10x Low Row 25# 15x Single Hand Low Row 15# 10x each Seated B Shoulder Flexion 2# 10x Standing Pullover 10# 10x, 15# 10x Standing Hip ABD Green TB with B Pole A 12x each Staggered Standing punch diagonal Double Blue TB 15x each  MHP with IFC (80-_0 ) to B upper L-spine to mid T-spine with pt hooklying x 15'  3 strips kinesiotape to L and T-spine with horiz strip just below bra line                        PT Short Term Goals - 03/10/15 1609    PT SHORT TERM GOAL #1   Title pt independent in intial HEP   Status Achieved           PT Long Term Goals - 03/30/15 1552    PT LONG TERM GOAL #1   Title pt independent with advanced HEP as necessary by 04/22/15  instructed in what may be final HEP progression   Status Partially Met               Plan - 04/20/15 1537    Clinical Impression Statement Increased variety and intensity of exercises today to include more standing and was very well tolerated.  Pt continues to be very active with working around house and is currently re-staining her fence.  She notes some soreness with this but only rating 3/10 today.  She states she doesn't require use of back brace during this activity which is quite impressive.    PT Next Visit Plan continue trunk stability progressions working towards more standing activities as tolerated   Consulted and Agree with Plan of Care Patient        Problem List Patient Active Problem List   Diagnosis Date Noted  . Preventative health care 09/08/2014  . Ovarian cyst 08/13/2014   . Preoperative cardiovascular examination 08/13/2014  . Abnormal EKG 08/13/2014  . PVC's (premature ventricular contractions)   . Fracture of left distal radius 12/30/2013  . Spinal stenosis of lumbar region 06/24/2013  . Atherosclerosis of aorta ([place]) 05/06/2013  . Osteopenia 03/06/2013  . Edema 11/05/2012  . Myalgia 04/11/2011  .  Night sweats 12/27/2010  . Contact lens/glasses fitting 12/27/2010  . Menopause 12/27/2010  . Family history of breast cancer 12/27/2010  . ADJUSTMENT DISORDER WITH ANXIOUS MOOD 05/26/2010  . PULMONARY NODULE, LEFT UPPER LOBE 12/15/2009  . CONSTIPATION, DRUG INDUCED 08/19/2009  . KNEE PAIN, LEFT 09/13/2008  . THYROID NODULE, LEFT 05/26/2008  . GERD 05/26/2008  . INSOMNIA 05/06/2008  . Anemia 10/24/2007  . Hypothyroidism 08/12/2007  . Hyperlipidemia 08/12/2007  . TOBACCO ABUSE 08/12/2007  . Essential hypertension 08/12/2007  . DEGENERATIVE JOINT DISEASE, LEFT KNEE 08/12/2007  . Hx of colonic polyps 08/28/2004    [facility] PT, OCS 04/20/2015, 3:57 PM  [facility] [address]  [place], [place], [address] Phone: [phone]   Fax:  [phone]

## 2015-04-20 NOTE — Telephone Encounter (Signed)
-----   Message from Rosalita Chessman, DO sent at 04/14/2015  3:24 PM EDT ----- We would need to see your formulary or you can call the ins co to see what the will pay for

## 2015-04-21 MED ORDER — CLONAZEPAM 1 MG PO TABS: 1.0000 mg | ORAL_TABLET | Freq: Every day | ORAL | Status: DC

## 2015-04-21 NOTE — Telephone Encounter (Signed)
Ok to refill klonopin 

## 2015-04-21 NOTE — Telephone Encounter (Signed)
Rx faxed.    KP 

## 2015-04-22 ENCOUNTER — Ambulatory Visit
Admission: RE | Admit: 2015-04-22 | Discharge: 2015-04-22 | Disposition: A | Discharge status: Home / Self Care | Payer: Medicare Other | Source: Ambulatory Visit

## 2015-04-22 DIAGNOSIS — Z1231 Encounter for screening mammogram for malignant neoplasm of breast: Secondary | ICD-10-CM

## 2015-04-25 ENCOUNTER — Other Ambulatory Visit: Payer: Self-pay | Admitting: Family Medicine

## 2015-04-25 DIAGNOSIS — R928 Other abnormal and inconclusive findings on diagnostic imaging of breast: Secondary | ICD-10-CM

## 2015-04-27 ENCOUNTER — Ambulatory Visit: Payer: Medicare Other | Admitting: Physical Therapy

## 2015-04-27 ENCOUNTER — Other Ambulatory Visit (INDEPENDENT_AMBULATORY_CARE_PROVIDER_SITE_OTHER): Payer: Medicare Other

## 2015-04-27 DIAGNOSIS — R29898 Other symptoms and signs involving the musculoskeletal system: Secondary | ICD-10-CM

## 2015-04-27 DIAGNOSIS — E039 Hypothyroidism, unspecified: Secondary | ICD-10-CM | POA: Diagnosis not present

## 2015-04-27 DIAGNOSIS — M546 Pain in thoracic spine: Secondary | ICD-10-CM | POA: Diagnosis not present

## 2015-04-27 NOTE — Telephone Encounter (Signed)
Left a message for call back.  

## 2015-04-27 NOTE — Therapy (Signed)
[facility] [address]  [place], [place], [address] Phone: [phone]   Fax:  [phone]  Physical Therapy Treatment  Patient Details  Name: Joan [name] MRN: [id number] Date of Birth: 03-12-1946 No Data Recorded  Encounter Date: 04/27/2015      PT End of Session - 04/27/15 1444    Visit Number 14   Number of Visits 16   PT Start Time 1062   PT Stop Time 1543   PT Time Calculation (min) 59 min      Past Medical History  Diagnosis Date  . Chronic low back pain     followed by Dr Hardin Negus pain mgt  . Hyperlipemia   . Tobacco abuse   . Hypothyroidism   . History of cardiac arrhythmia     cardiologist- Traci Turner  . Esophageal stricture   . Thyroid nodule   . Colon polyp   . PONV (postoperative nausea and vomiting)     Pt reports symptoms are the result of gallbladder and cholecystectomy, not anesthesia  . Heart murmur     "slight; not on RX" (06/24/2013)  . Sleep apnea 1990's    "tested; tried mask; couldn't stand it; told me as long as I slept on my side I'd be ok" (06/24/2013)  . Osteoarthritis of left knee     advanced  . Arthritis     "shoulders; wrist; probably spine" (06/24/2013)  . GERD (gastroesophageal reflux disease)   . Constipation   . Hypertension   . PVC's (premature ventricular contractions)   . Finger pain, left     2 fingers on left hand since wrist surgery  . Hx of colonic polyps 08/28/2004    Past Surgical History  Procedure Laterality Date  . Lumbar fusion      and rods  . Cesarean section  1972  . Ankle surgery Left 1995    "tendon repair" (06/24/2013)  . Infusion pump implantation  1990's    "implantablet morphine pump; took it out w/in 11 months  . Knee arthroscopy Left 1991; ~ 1993  . Elbow surgery  1990's  . Thyroidectomy  2012  . Cervical spine surgery  2012  . Foot surgery Right 2012    SPUR REMOVED  . Shoulder arthroscopy Left 09/2011  . Lumbar  laminectomy/decompression microdiscectomy N/A 11/28/2012    Procedure: DECOMPRESSIVE LUMBAR LAMINECTOMY LEVEL 1;  Surgeon: Elaina Hoops, MD;  Location: [facility];  Service: Neurosurgery;  Laterality: N/A;  DECOMPRESSIVE LUMBAR LAMINECTOMY LEVEL 1  . Back surgery      "think today was my 8th back OR" (06/24/2013)  . Cholecystectomy N/A 04/16/2013    Procedure: LAPAROSCOPIC CHOLECYSTECTOMY ;  Surgeon: Imogene Burn. Georgette Dover, MD;  Location: [facility];  Service: General;  Laterality: N/A;  . Laparoscopic lysis of adhesions N/A 04/16/2013    Procedure: LAPAROSCOPIC LYSIS OF ADHESIONS;  Surgeon: Imogene Burn. Tsuei, MD;  Location: [facility];  Service: General;  Laterality: N/A;  . Posterior fusion lumbar spine  06/24/2013  . Abdominal hysterectomy  1988    "partial" (06/24/2013)  . Orif distal radius fracture Left 12/30/2013    dr Caralyn Guile  . Orif wrist fracture Left 12/30/2013    Procedure: OPEN REDUCTION INTERNAL FIXATION (ORIF) LEFT WRIST FRACTURE AND REPAIR AS INDICATED;  Surgeon: Linna Hoff, MD;  Location: [facility];  Service: Orthopedics;  Laterality: Left;  . Robotic assisted salpingo oopherectomy Bilateral 08/20/2014    Procedure: ROBOTIC ASSISTED SALPINGO OOPHORECTOMY;  Surgeon: Daria Pastures, MD;  Location: [facility];  Service: Gynecology;  Laterality: Bilateral;  . Abdominal hysterectomy      partial in 1988    There were no vitals filed for this visit.  Visit Diagnosis:  Bilateral thoracic back pain  Shoulder weakness      Subjective Assessment - 04/27/15 1445    Subjective States worked around house a lot yesterday to include sanding hand rail and pulled rugs outside to shampoo them.  States did wear back brace during these activities.  Both activities required prolonged flexion.  States did not sleep well last night due to pain up to 8/10.   Currently in Pain? Yes   Pain Score 5    Pain Location Back   Pain Orientation Mid              TODAY'S TREATMENT TherEx - Seated  Unilateral Hip ABD with Black TB around B knees 15x each Seated Hip ADD Double Black TB 15x each Low Row 25# 15x, Single Hand Low Row 15# 10x each Knee Flexion Machine 20# 2x15 Staggered Standing punch diagonal Double Blue TB 15x each Hooklying Reverse Curl-up 10x Supine DL SLR 2x (did not ask pt to perform this but she was she was able to perform with good form and no increased pain) Stretch B SKTC, HS, Piriformis, and LE Nerve glide  MHP with IFC (80-_0 ) to B upper L-spine to mid T-spine with pt hooklying x 15'  3 strips kinesiotape to L and T-spine with horiz strip just below bra line                    PT Short Term Goals - 03/10/15 1609    PT SHORT TERM GOAL #1   Title pt independent in intial HEP   Status Achieved           PT Long Term Goals - 03/30/15 1552    PT LONG TERM GOAL #1   Title pt independent with advanced HEP as necessary by 04/22/15  instructed in what may be final HEP progression   Status Partially Met               Plan - 04/27/15 1529    Clinical Impression Statement This is last PT treatment for a while as pt is scheduled to undergo surgical hardware removal to L wrist next week.  She doesn't have any additional PT visits scheduled at this time and she isn't sure how long she'll have to wait after her wrist surgery.  Regardless, she has progressed quite well and is nearing end of POC (has 2 visits remaining).  I will re-assess her back and POC once she returns following wrist surgery assuming she is able to return within 30 days or so.  If it takes much more than 30 days we will discharge her and will require new referral if she wanted to return to PT.   PT Next Visit Plan re-assess upon return following wrist surgery   Consulted and Agree with Plan of Care Patient        Problem List Patient Active Problem List   Diagnosis Date Noted  . Preventative health care 09/08/2014  . Ovarian cyst 08/13/2014  . Preoperative  cardiovascular examination 08/13/2014  . Abnormal EKG 08/13/2014  . PVC's (premature ventricular contractions)   . Fracture of left distal radius 12/30/2013  . Spinal stenosis of lumbar region 06/24/2013  . Atherosclerosis of aorta ([place]) 05/06/2013  . Osteopenia 03/06/2013  .  Edema 11/05/2012  . Myalgia 04/11/2011  . Night sweats 12/27/2010  . Contact lens/glasses fitting 12/27/2010  . Menopause 12/27/2010  . Family history of breast cancer 12/27/2010  . ADJUSTMENT DISORDER WITH ANXIOUS MOOD 05/26/2010  . PULMONARY NODULE, LEFT UPPER LOBE 12/15/2009  . CONSTIPATION, DRUG INDUCED 08/19/2009  . KNEE PAIN, LEFT 09/13/2008  . THYROID NODULE, LEFT 05/26/2008  . GERD 05/26/2008  . INSOMNIA 05/06/2008  . Anemia 10/24/2007  . Hypothyroidism 08/12/2007  . Hyperlipidemia 08/12/2007  . TOBACCO ABUSE 08/12/2007  . Essential hypertension 08/12/2007  . DEGENERATIVE JOINT DISEASE, LEFT KNEE 08/12/2007  . Hx of colonic polyps 08/28/2004    [facility] PT, OCS 04/27/2015, 3:47 PM  [facility] [address]  [place], [place], [address] Phone: [phone]   Fax:  [phone]  Name: Joan [name] MRN: [id number] Date of Birth: Dec 04, 1945

## 2015-04-27 NOTE — Telephone Encounter (Signed)
Pharmacy: [id number] - [place], [place] - [address]  Pt came in office stating she has called three times already to get a new rx that will cover over fenofibrate with insurance that will be cheaper but now pt states it is ok to sent the rx for Fenofibrate to her pharmacy, she will make arrangement to pay for it since she is not of her meds and she is needing it. Please advise.

## 2015-04-28 LAB — TSH: TSH: 1.41 u[IU]/mL (ref 0.35–4.50)

## 2015-04-28 NOTE — Telephone Encounter (Signed)
Spoke with patient and made her aware that all of Fenofibrate are tier 3. Dr.Lowne verbalized that the patient can try 2000 mg of fish and 2000 mg of flax seed oil and recheck the labs in 3 months. The patient has agreed and verbalized understanding. She will hold off on the fenofibrate at this time.      KP

## 2015-04-28 NOTE — Addendum Note (Signed)
Addended by: Ewing Schlein on: 04/28/2015 05:48 PM   Modules accepted: Medications

## 2015-04-28 NOTE — Telephone Encounter (Signed)
They are all tier 3----there is not one that is cheaper

## 2015-04-28 NOTE — Telephone Encounter (Signed)
I printed the formulary offline and placed in the red folder, please advise     KP

## 2015-04-29 ENCOUNTER — Ambulatory Visit
Admission: RE | Admit: 2015-04-29 | Discharge: 2015-04-29 | Disposition: A | Discharge status: Home / Self Care | Payer: Medicare Other | Source: Ambulatory Visit | Attending: Family Medicine | Admitting: Family Medicine

## 2015-04-29 DIAGNOSIS — R928 Other abnormal and inconclusive findings on diagnostic imaging of breast: Secondary | ICD-10-CM

## 2015-05-02 ENCOUNTER — Ambulatory Visit: Payer: Medicare Other | Admitting: Physical Therapy

## 2015-05-02 ENCOUNTER — Other Ambulatory Visit: Payer: Self-pay | Admitting: Family Medicine

## 2015-05-30 ENCOUNTER — Ambulatory Visit: Payer: Medicare Other | Attending: Neurosurgery | Admitting: Physical Therapy

## 2015-05-30 DIAGNOSIS — M546 Pain in thoracic spine: Secondary | ICD-10-CM | POA: Insufficient documentation

## 2015-05-30 DIAGNOSIS — R29898 Other symptoms and signs involving the musculoskeletal system: Secondary | ICD-10-CM | POA: Insufficient documentation

## 2015-05-30 NOTE — Therapy (Signed)
[facility] [address]  [place], [place], [address] Phone: [phone]   Fax:  [phone]  Physical Therapy Treatment  Patient Details  Name: Joan [name] MRN: [id number] Date of Birth: September 07, 1945 Referring Provider: Kary Kos  Encounter Date: 05/30/2015      PT End of Session - 05/30/15 1322    Visit Number 15   Number of Visits 22   Date for PT Re-Evaluation 06/27/15   PT Start Time [id number]   PT Stop Time 1415   PT Time Calculation (min) 58 min      Past Medical History  Diagnosis Date  . Chronic low back pain     followed by Dr Hardin Negus pain mgt  . Hyperlipemia   . Tobacco abuse   . Hypothyroidism   . History of cardiac arrhythmia     cardiologist- Traci Turner  . Esophageal stricture   . Thyroid nodule   . Colon polyp   . PONV (postoperative nausea and vomiting)     Pt reports symptoms are the result of gallbladder and cholecystectomy, not anesthesia  . Heart murmur     "slight; not on RX" (06/24/2013)  . Sleep apnea 1990's    "tested; tried mask; couldn't stand it; told me as long as I slept on my side I'd be ok" (06/24/2013)  . Osteoarthritis of left knee     advanced  . Arthritis     "shoulders; wrist; probably spine" (06/24/2013)  . GERD (gastroesophageal reflux disease)   . Constipation   . Hypertension   . PVC's (premature ventricular contractions)   . Finger pain, left     2 fingers on left hand since wrist surgery  . Hx of colonic polyps 08/28/2004    Past Surgical History  Procedure Laterality Date  . Lumbar fusion      and rods  . Cesarean section  1972  . Ankle surgery Left 1995    "tendon repair" (06/24/2013)  . Infusion pump implantation  1990's    "implantablet morphine pump; took it out w/in 11 months  . Knee arthroscopy Left 1991; ~ 1993  . Elbow surgery  1990's  . Thyroidectomy  2012  . Cervical spine surgery  2012  . Foot surgery Right 2012    SPUR REMOVED   . Shoulder arthroscopy Left 09/2011  . Lumbar laminectomy/decompression microdiscectomy N/A 11/28/2012    Procedure: DECOMPRESSIVE LUMBAR LAMINECTOMY LEVEL 1;  Surgeon: Elaina Hoops, MD;  Location: [facility];  Service: Neurosurgery;  Laterality: N/A;  DECOMPRESSIVE LUMBAR LAMINECTOMY LEVEL 1  . Back surgery      "think today was my 8th back OR" (06/24/2013)  . Cholecystectomy N/A 04/16/2013    Procedure: LAPAROSCOPIC CHOLECYSTECTOMY ;  Surgeon: Imogene Burn. Georgette Dover, MD;  Location: [facility];  Service: General;  Laterality: N/A;  . Laparoscopic lysis of adhesions N/A 04/16/2013    Procedure: LAPAROSCOPIC LYSIS OF ADHESIONS;  Surgeon: Imogene Burn. Tsuei, MD;  Location: [facility];  Service: General;  Laterality: N/A;  . Posterior fusion lumbar spine  06/24/2013  . Abdominal hysterectomy  1988    "partial" (06/24/2013)  . Orif distal radius fracture Left 12/30/2013    dr Caralyn Guile  . Orif wrist fracture Left 12/30/2013    Procedure: OPEN REDUCTION INTERNAL FIXATION (ORIF) LEFT WRIST FRACTURE AND REPAIR AS INDICATED;  Surgeon: Linna Hoff, MD;  Location: [facility];  Service: Orthopedics;  Laterality: Left;  . Robotic assisted salpingo oopherectomy Bilateral 08/20/2014  Procedure: ROBOTIC ASSISTED SALPINGO OOPHORECTOMY;  Surgeon: Daria Pastures, MD;  Location: [facility];  Service: Gynecology;  Laterality: Bilateral;  . Abdominal hysterectomy      partial in 1988    There were no vitals filed for this visit.  Visit Diagnosis:  Bilateral thoracic back pain - Plan: PT plan of care cert/re-cert  Shoulder weakness - Plan: PT plan of care cert/re-cert      Subjective Assessment - 05/30/15 1317    Subjective pt underwent surgical hardware removal to L wrist on 05/04/15.  Following this she was casted x2 weeks then progressed to wrist brace.  Surgeon has recently allowed pt to return to PT for treatment of back pain as long as she wears brace to wrist while exercising. She states her back has been pretty sore  lately stating she has been able to perform some of her HEP.  She states being inactive following surgery seems to have set her back with regard to strength and stiffness.  She states she is down to taking pain meds for her back every 5 hours (was at every 4 hours when last seen here).   Currently in Pain? Yes   Pain Score 4   4/10 on AVG, up to 6/10 with movement   Pain Location Back   Pain Orientation Mid   Pain Descriptors / Indicators Sore;Tightness            [facility] PT Assessment - 05/30/15 0001    Assessment   Referring Provider Kary Kos   Strength   Overall Strength Comments B Shoulder 4+/5 into Flex and ABD no pain   Flexibility   Soft Tissue Assessment /Muscle Length yes   Hamstrings good R + L   Piriformis tight on L with some mid back pain          TODAY'S TREATMENT TherEx - Seated Unilateral Hip ABD Black TB 2x10 each Seated Unilateral Hip ADD Black TB 2x10 each Bridge 10x Stretch B HS, Piriformis, SKTC Hooklying LTR isometrics 8x5" each Low Row 20# 12x Single Hand Low Row 10# 10x  MHP with IFC (80-150Hz ) to B upper L-spine to mid T-spine with pt hooklying x 15'  3 strips kinesiotape to L and T-spine with 75% horiz strip just below bra line           PT Short Term Goals - 03/10/15 1609    PT SHORT TERM GOAL #1   Title pt independent in intial HEP   Status Achieved           PT Long Term Goals - 05/30/15 1331    PT LONG TERM GOAL #1   Title pt independent with advanced HEP as necessary by 06/27/15  pt instructed in advanced HEP prior to wrist surgery but is not able to perform all of it at this time   Status On-going   PT LONG TERM GOAL #2   Title B shoulder MMT 4+/5 or better without c/o back pain with testing   Status Achieved   PT LONG TERM GOAL #3   Title pt reports able to perform chores and ADLs with greater ease without constant need for back brace and rarely limited by back pain by 06/27/15   Status On-going                Plan - 05/30/15 1458    Clinical Impression Statement pt returns to PT today after 4 weeks away from PT due to undergoing recent surgery.  Pt underwent  surgical hardware removal to L wrist on 05/04/15 and has recently been released by surgeon to return to PT for back pain.  Pt states she has noted some increased tightness and soreness in her back since her wrist surgery due to being less active.  Today, she noted more pain with supine<->sit transfers and required Min Assist during one of her transfers due to pain.  She states her back pain has been 4/10 on average lately and 6/10 with movement.  She states she has been attempting to get back to regular performance of HEP lately.  Assessment today reveals mild tightness in L hip flexion and within L piriformis with c/o some mid back pain with stretch to this area.  Her B shoulder flexion and ABD MMT are 4+/5 to 5/5 without c/o back pain which is a good improvement since her initial eval.  In addition, pt states she is down to taking her pain medication every 5 hours (she was taking every 4 hours at last PT session).  Due to increased stiffness/tightness and soreness related to decreased activity following recent wrist surgery, Ms. Kovalchuk wants to extend her PT.  She had 2 visits remaining on POC at her last apointment and I am recommending she continue PT at 2x/wk for up to 4 more weeks to progress her level of function and HEP.   PT Frequency 2x / week   PT Duration 4 weeks   PT Next Visit Plan supine, seated, and standing trunk stability training; modalities PRN   Consulted and Agree with Plan of Care Patient        Problem List Patient Active Problem List   Diagnosis Date Noted  . Preventative health care 09/08/2014  . Ovarian cyst 08/13/2014  . Preoperative cardiovascular examination 08/13/2014  . Abnormal EKG 08/13/2014  . PVC's (premature ventricular contractions)   . Fracture of left distal radius 12/30/2013  . Spinal stenosis of  lumbar region 06/24/2013  . Atherosclerosis of aorta ([place]) 05/06/2013  . Osteopenia 03/06/2013  . Edema 11/05/2012  . Myalgia 04/11/2011  . Night sweats 12/27/2010  . Contact lens/glasses fitting 12/27/2010  . Menopause 12/27/2010  . Family history of breast cancer 12/27/2010  . ADJUSTMENT DISORDER WITH ANXIOUS MOOD 05/26/2010  . PULMONARY NODULE, LEFT UPPER LOBE 12/15/2009  . CONSTIPATION, DRUG INDUCED 08/19/2009  . KNEE PAIN, LEFT 09/13/2008  . THYROID NODULE, LEFT 05/26/2008  . GERD 05/26/2008  . INSOMNIA 05/06/2008  . Anemia 10/24/2007  . Hypothyroidism 08/12/2007  . Hyperlipidemia 08/12/2007  . TOBACCO ABUSE 08/12/2007  . Essential hypertension 08/12/2007  . DEGENERATIVE JOINT DISEASE, LEFT KNEE 08/12/2007  . Hx of colonic polyps 08/28/2004    [facility] PT, OCS 05/30/2015, 3:20 PM  [facility] [address]  [place], [place], [address] Phone: [phone]   Fax:  [phone]  Name: Joan [name] MRN: [id number] Date of Birth: 09-01-1945

## 2015-06-07 ENCOUNTER — Ambulatory Visit: Payer: Medicare Other | Admitting: Physical Therapy

## 2015-06-07 DIAGNOSIS — R29898 Other symptoms and signs involving the musculoskeletal system: Secondary | ICD-10-CM

## 2015-06-07 DIAGNOSIS — M546 Pain in thoracic spine: Secondary | ICD-10-CM

## 2015-06-07 NOTE — Therapy (Signed)
[facility] [address]  [place], [place], [address] Phone: [phone]   Fax:  [phone]  Physical Therapy Treatment  Patient Details  Name: Joan [name] MRN: [id number] Date of Birth: 04-12-1946 Referring Provider: Kary Kos  Encounter Date: 06/07/2015      PT End of Session - 06/07/15 1415    Visit Number 16   Number of Visits 22   Date for PT Re-Evaluation 06/27/15   PT Start Time [id number]   PT Stop Time 1508   PT Time Calculation (min) 54 min      Past Medical History  Diagnosis Date  . Chronic low back pain     followed by Dr Hardin Negus pain mgt  . Hyperlipemia   . Tobacco abuse   . Hypothyroidism   . History of cardiac arrhythmia     cardiologist- Traci Turner  . Esophageal stricture   . Thyroid nodule   . Colon polyp   . PONV (postoperative nausea and vomiting)     Pt reports symptoms are the result of gallbladder and cholecystectomy, not anesthesia  . Heart murmur     "slight; not on RX" (06/24/2013)  . Sleep apnea 1990's    "tested; tried mask; couldn't stand it; told me as long as I slept on my side I'd be ok" (06/24/2013)  . Osteoarthritis of left knee     advanced  . Arthritis     "shoulders; wrist; probably spine" (06/24/2013)  . GERD (gastroesophageal reflux disease)   . Constipation   . Hypertension   . PVC's (premature ventricular contractions)   . Finger pain, left     2 fingers on left hand since wrist surgery  . Hx of colonic polyps 08/28/2004    Past Surgical History  Procedure Laterality Date  . Lumbar fusion      and rods  . Cesarean section  1972  . Ankle surgery Left 1995    "tendon repair" (06/24/2013)  . Infusion pump implantation  1990's    "implantablet morphine pump; took it out w/in 11 months  . Knee arthroscopy Left 1991; ~ 1993  . Elbow surgery  1990's  . Thyroidectomy  2012  . Cervical spine surgery  2012  . Foot surgery Right 2012    SPUR REMOVED   . Shoulder arthroscopy Left 09/2011  . Lumbar laminectomy/decompression microdiscectomy N/A 11/28/2012    Procedure: DECOMPRESSIVE LUMBAR LAMINECTOMY LEVEL 1;  Surgeon: Elaina Hoops, MD;  Location: [facility];  Service: Neurosurgery;  Laterality: N/A;  DECOMPRESSIVE LUMBAR LAMINECTOMY LEVEL 1  . Back surgery      "think today was my 8th back OR" (06/24/2013)  . Cholecystectomy N/A 04/16/2013    Procedure: LAPAROSCOPIC CHOLECYSTECTOMY ;  Surgeon: Imogene Burn. Georgette Dover, MD;  [facility]: [facility];  Service: General;  Laterality: N/A;  . Laparoscopic lysis of adhesions N/A 04/16/2013    Procedure: LAPAROSCOPIC LYSIS OF ADHESIONS;  Surgeon: Imogene Burn. Tsuei, MD;  Location: [facility];  Service: General;  Laterality: N/A;  . Posterior fusion lumbar spine  06/24/2013  . Abdominal hysterectomy  1988    "partial" (06/24/2013)  . Orif distal radius fracture Left 12/30/2013    dr Caralyn Guile  . Orif wrist fracture Left 12/30/2013    Procedure: OPEN REDUCTION INTERNAL FIXATION (ORIF) LEFT WRIST FRACTURE AND REPAIR AS INDICATED;  Surgeon: Linna Hoff, MD;  Location: [facility];  Service: Orthopedics;  Laterality: Left;  . Robotic assisted salpingo oopherectomy Bilateral 08/20/2014  Procedure: ROBOTIC ASSISTED SALPINGO OOPHORECTOMY;  Surgeon: Daria Pastures, MD;  Location: [facility];  Service: Gynecology;  Laterality: Bilateral;  . Abdominal hysterectomy      partial in 1988    There were no vitals filed for this visit.  Visit Diagnosis:  Bilateral thoracic back pain  Shoulder weakness      Subjective Assessment - 06/07/15 1416    Subjective States experienced great deal of pain following last treatment which lasted several hours but states after this pain decreased she has been feeling better.  She states pain was up to 8/10 at that time but is down to 3/10 today.  She is anxious to get rid of wrist brace to L wrist and this is scheduled to happen in next 1-2 weeks.   Currently in Pain? Yes   Pain Score 3    Pain  Location Back   Pain Orientation Mid              TODAY'S TREATMENT TherEx -  Bridge 10x Stretch B HS, Piriformis, SKTC Hooklying March 12x each Hooklying Unilateral Hip ABD Black TB 15x each Hooklying Unilateral Hip ADD Black TB 15x each SLR 2# 13x on R 15x on L (R knee became fatigued) Low Row 20# 20x Standing Hip ABD Yellow TB at ankles 12x each  MHP with IFC (80-150Hz ) to B upper L-spine to mid T-spine with pt hooklying x 15'  3 strips kinesiotape to L and T-spine with 75% horiz strip just below bra line                      PT Short Term Goals - 03/10/15 1609    PT SHORT TERM GOAL #1   Title pt independent in intial HEP   Status Achieved           PT Long Term Goals - 05/30/15 1331    PT LONG TERM GOAL #1   Title pt independent with advanced HEP as necessary by 06/27/15  pt instructed in advanced HEP prior to wrist surgery but is not able to perform all of it at this time   Status On-going   PT LONG TERM GOAL #2   Title B shoulder MMT 4+/5 or better without c/o back pain with testing   Status Achieved   PT LONG TERM GOAL #3   Title pt reports able to perform chores and ADLs with greater ease without constant need for back brace and rarely limited by back pain by 06/27/15   Status On-going               Plan - 06/07/15 1546    Clinical Impression Statement increased pain after last treatment so did not perform any rotational stability exercises today as these are likely reason for her pain.  All exercises well tolerated today and no c/o increased pain during treatment.   PT Next Visit Plan supine, seated, and standing trunk stability training; modalities PRN   Consulted and Agree with Plan of Care Patient        Problem List Patient Active Problem List   Diagnosis Date Noted  . Preventative health care 09/08/2014  . Ovarian cyst 08/13/2014  . Preoperative cardiovascular examination 08/13/2014  . Abnormal EKG 08/13/2014   . PVC's (premature ventricular contractions)   . Fracture of left distal radius 12/30/2013  . Spinal stenosis of lumbar region 06/24/2013  . Atherosclerosis of aorta ([place]) 05/06/2013  . Osteopenia 03/06/2013  . Edema 11/05/2012  .  Myalgia 04/11/2011  . Night sweats 12/27/2010  . Contact lens/glasses fitting 12/27/2010  . Menopause 12/27/2010  . Family history of breast cancer 12/27/2010  . ADJUSTMENT DISORDER WITH ANXIOUS MOOD 05/26/2010  . PULMONARY NODULE, LEFT UPPER LOBE 12/15/2009  . CONSTIPATION, DRUG INDUCED 08/19/2009  . KNEE PAIN, LEFT 09/13/2008  . THYROID NODULE, LEFT 05/26/2008  . GERD 05/26/2008  . INSOMNIA 05/06/2008  . Anemia 10/24/2007  . Hypothyroidism 08/12/2007  . Hyperlipidemia 08/12/2007  . TOBACCO ABUSE 08/12/2007  . Essential hypertension 08/12/2007  . DEGENERATIVE JOINT DISEASE, LEFT KNEE 08/12/2007  . Hx of colonic polyps 08/28/2004    [facility] 06/07/2015, 3:47 PM  [facility] [address]  [place], [place], [address] Phone: [phone]   Fax:  [phone]  Name: MUBINA PRONOVOST MRN: [id number] Date of Birth: 1946/06/18

## 2015-06-14 ENCOUNTER — Encounter: Payer: Self-pay | Admitting: Family

## 2015-06-15 ENCOUNTER — Telehealth: Payer: Self-pay

## 2015-06-15 ENCOUNTER — Other Ambulatory Visit: Payer: Self-pay

## 2015-06-15 ENCOUNTER — Ambulatory Visit: Payer: Medicare Other | Attending: Neurosurgery | Admitting: Physical Therapy

## 2015-06-15 DIAGNOSIS — M546 Pain in thoracic spine: Secondary | ICD-10-CM | POA: Diagnosis not present

## 2015-06-15 DIAGNOSIS — R29898 Other symptoms and signs involving the musculoskeletal system: Secondary | ICD-10-CM | POA: Diagnosis present

## 2015-06-15 MED ORDER — POTASSIUM CHLORIDE CRYS ER 20 MEQ PO TBCR: 60.0000 meq | EXTENDED_RELEASE_TABLET | Freq: Every day | ORAL | Status: DC

## 2015-06-15 MED ORDER — CLONAZEPAM 1 MG PO TABS: 1.0000 mg | ORAL_TABLET | Freq: Every day | ORAL | Status: DC

## 2015-06-15 MED ORDER — LEVOTHYROXINE SODIUM 88 MCG PO TABS: 88.0000 ug | ORAL_TABLET | Freq: Every day | ORAL | Status: DC

## 2015-06-15 MED ORDER — SIMVASTATIN 20 MG PO TABS: 20.0000 mg | ORAL_TABLET | Freq: Every day | ORAL | Status: DC

## 2015-06-15 MED ORDER — FUROSEMIDE 20 MG PO TABS: 20.0000 mg | ORAL_TABLET | Freq: Every day | ORAL | Status: DC | PRN

## 2015-06-15 MED ORDER — OMEPRAZOLE 40 MG PO CPDR: 40.0000 mg | DELAYED_RELEASE_CAPSULE | Freq: Every day | ORAL | Status: DC

## 2015-06-15 MED ORDER — HYDROCHLOROTHIAZIDE 25 MG PO TABS: 25.0000 mg | ORAL_TABLET | Freq: Every day | ORAL | Status: DC

## 2015-06-15 NOTE — Telephone Encounter (Signed)
Pt is requesting refill on Clonazepam. [facility] Pt.  Last OV: 04/04/2015 Last Fill: 04/21/2015 #30 and 1RF UDS: 08/17/2013 Low risk  Please advise.

## 2015-06-15 NOTE — Telephone Encounter (Signed)
Rx faxed to [facility]

## 2015-06-15 NOTE — Telephone Encounter (Signed)
Ok #30, no RF 

## 2015-06-15 NOTE — Telephone Encounter (Signed)
Rx printed, awaiting MD signature.  

## 2015-06-15 NOTE — Therapy (Signed)
[facility] [address]  [place], [place], [address] Phone: [phone]   Fax:  [phone]  Physical Therapy Treatment  Patient Details  Name: Joan [name] MRN: [id number] Date of Birth: May 17, 1946 Referring Provider: Kary Kos  Encounter Date: 06/15/2015      PT End of Session - 06/15/15 1409    Visit Number 17   Number of Visits 22   Date for PT Re-Evaluation 06/27/15   PT Start Time 1408   PT Stop Time 1513   PT Time Calculation (min) 65 min      Past Medical History  Diagnosis Date  . Chronic low back pain     followed by Dr Hardin Negus pain mgt  . Hyperlipemia   . Tobacco abuse   . Hypothyroidism   . History of cardiac arrhythmia     cardiologist- Traci Turner  . Esophageal stricture   . Thyroid nodule   . Colon polyp   . PONV (postoperative nausea and vomiting)     Pt reports symptoms are the result of gallbladder and cholecystectomy, not anesthesia  . Heart murmur     "slight; not on RX" (06/24/2013)  . Sleep apnea 1990's    "tested; tried mask; couldn't stand it; told me as long as I slept on my side I'd be ok" (06/24/2013)  . Osteoarthritis of left knee     advanced  . Arthritis     "shoulders; wrist; probably spine" (06/24/2013)  . GERD (gastroesophageal reflux disease)   . Constipation   . Hypertension   . PVC's (premature ventricular contractions)   . Finger pain, left     2 fingers on left hand since wrist surgery  . Hx of colonic polyps 08/28/2004    Past Surgical History  Procedure Laterality Date  . Lumbar fusion      and rods  . Cesarean section  1972  . Ankle surgery Left 1995    "tendon repair" (06/24/2013)  . Infusion pump implantation  1990's    "implantablet morphine pump; took it out w/in 11 months  . Knee arthroscopy Left 1991; ~ 1993  . Elbow surgery  1990's  . Thyroidectomy  2012  . Cervical spine surgery  2012  . Foot surgery Right 2012    SPUR REMOVED  .  Shoulder arthroscopy Left 09/2011  . Lumbar laminectomy/decompression microdiscectomy N/A 11/28/2012    Procedure: DECOMPRESSIVE LUMBAR LAMINECTOMY LEVEL 1;  Surgeon: Elaina Hoops, MD;  Location: [facility];  Service: Neurosurgery;  Laterality: N/A;  DECOMPRESSIVE LUMBAR LAMINECTOMY LEVEL 1  . Back surgery      "think today was my 8th back OR" (06/24/2013)  . Cholecystectomy N/A 04/16/2013    Procedure: LAPAROSCOPIC CHOLECYSTECTOMY ;  Surgeon: Imogene Burn. Georgette Dover, MD;  Location: [facility];  Service: General;  Laterality: N/A;  . Laparoscopic lysis of adhesions N/A 04/16/2013    Procedure: LAPAROSCOPIC LYSIS OF ADHESIONS;  Surgeon: Imogene Burn. Tsuei, MD;  Location: [facility];  Service: General;  Laterality: N/A;  . Posterior fusion lumbar spine  06/24/2013  . Abdominal hysterectomy  1988    "partial" (06/24/2013)  . Orif distal radius fracture Left 12/30/2013    dr Caralyn Guile  . Orif wrist fracture Left 12/30/2013    Procedure: OPEN REDUCTION INTERNAL FIXATION (ORIF) LEFT WRIST FRACTURE AND REPAIR AS INDICATED;  Surgeon: Linna Hoff, MD;  Location: [facility];  Service: Orthopedics;  Laterality: Left;  . Robotic assisted salpingo oopherectomy Bilateral 08/20/2014  Procedure: ROBOTIC ASSISTED SALPINGO OOPHORECTOMY;  Surgeon: Daria Pastures, MD;  Location: [facility];  Service: Gynecology;  Laterality: Bilateral;  . Abdominal hysterectomy      partial in 1988    There were no vitals filed for this visit.  Visit Diagnosis:  Bilateral thoracic back pain  Shoulder weakness      Subjective Assessment - 06/15/15 1409    Subjective States lower back is a little sore today from vacuuming yesterday.  Overall states is doing well but vacuuming and cleaning continue to cause some LBP (did not wear back brace during this).   Currently in Pain? Yes   Pain Score 4    Pain Location Back   Pain Orientation Mid          TODAY'S TREATMENT TherEx - Bridge 10x Stretch B HS, Piriformis, SKTC SLR with TrA 10x  each [place] 14x Seated One-Arm Row Black TB 15x each Seated B Shoulder Flexion with 5# db for trunk stability 10x3" (noted some R shoulder pain with this) Low Row 20# 20x Standing Hip Ext Yellow TB at ankles 12x each Standing Hip ABD Yellow TB at ankles 12x each  MHP with IFC (80-_0 ) to B upper L-spine to mid T-spine with pt hooklying x 15'  3 strips kinesiotape to L and T-spine with 75% horiz strip just below bra line           PT Education - 06/15/15 1608    Education provided Yes   Education Details added Yellow TB to standing hip exercises   Person(s) Educated Patient   Methods Explanation   Comprehension Verbalized understanding;Returned demonstration          PT Short Term Goals - 03/10/15 1609    PT SHORT TERM GOAL #1   Title pt independent in intial HEP   Status Achieved           PT Long Term Goals - 06/15/15 1614    PT LONG TERM GOAL #1   Title pt independent with advanced HEP as necessary by 06/27/15  attempted to progress with more rotational stability today but limited by back pain   Status On-going   PT LONG TERM GOAL #2   Title B shoulder MMT 4+/5 or better without c/o back pain with testing   Status Achieved   PT LONG TERM GOAL #3   Title pt reports able to perform chores and ADLs with greater ease without constant need for back brace and rarely limited by back pain by 06/27/15  intermittent use of back brace and rarely limited by pain however she does tend to push a little to far into pain at times to complete her task   Status Partially Met               Plan - 06/15/15 1609    Clinical Impression Statement pt with some increased back soreness after vacuuming at home yesterday.  Tolerated most treatment very well but again attempted some rotational stabiity exercise and she noted pain with this. This has been her chief limitation with exercises in the clinic.  Today she also noted R shoulder pain during seated trunk stability  exercise that included shoulder flexion.  She states this pain is rare and we have done similar exercise in the past so hopefully is not an issue going forward.   PT Next Visit Plan supine, seated, and standing trunk stability training; modalities PRN   Consulted and Agree with Plan of Care Patient  Problem List Patient Active Problem List   Diagnosis Date Noted  . Preventative health care 09/08/2014  . Ovarian cyst 08/13/2014  . Preoperative cardiovascular examination 08/13/2014  . Abnormal EKG 08/13/2014  . PVC's (premature ventricular contractions)   . Fracture of left distal radius 12/30/2013  . Spinal stenosis of lumbar region 06/24/2013  . Atherosclerosis of aorta ([place]) 05/06/2013  . Osteopenia 03/06/2013  . Edema 11/05/2012  . Myalgia 04/11/2011  . Night sweats 12/27/2010  . Contact lens/glasses fitting 12/27/2010  . Menopause 12/27/2010  . Family history of breast cancer 12/27/2010  . ADJUSTMENT DISORDER WITH ANXIOUS MOOD 05/26/2010  . PULMONARY NODULE, LEFT UPPER LOBE 12/15/2009  . CONSTIPATION, DRUG INDUCED 08/19/2009  . KNEE PAIN, LEFT 09/13/2008  . THYROID NODULE, LEFT 05/26/2008  . GERD 05/26/2008  . INSOMNIA 05/06/2008  . Anemia 10/24/2007  . Hypothyroidism 08/12/2007  . Hyperlipidemia 08/12/2007  . TOBACCO ABUSE 08/12/2007  . Essential hypertension 08/12/2007  . DEGENERATIVE JOINT DISEASE, LEFT KNEE 08/12/2007  . Hx of colonic polyps 08/28/2004    [facility] PT, OCS 06/15/2015, 4:16 PM  [facility] [address]  [address] [place], [place], [address] Phone: [phone]   Fax:  [phone]  Name: Joan [name] MRN: [id number] Date of Birth: 1946/01/31

## 2015-06-22 ENCOUNTER — Other Ambulatory Visit: Payer: Self-pay | Admitting: Family Medicine

## 2015-06-22 ENCOUNTER — Telehealth: Payer: Self-pay | Admitting: Family Medicine

## 2015-06-22 NOTE — Telephone Encounter (Signed)
Patient called stating clonazePAM (KLONOPIN) 1 MG tablet should have been sent to  [facility] [place], [place] [phone] (Phone) [phone] (Fax)

## 2015-06-22 NOTE — Telephone Encounter (Signed)
[id number] ° °

## 2015-06-22 NOTE — Telephone Encounter (Signed)
Rx faxed to [facility], please inform Pt to make sure when she requests a refill on a medication that she is requesting to the right pharmacy. I will send Rx's to whichever pharmacy either faxed us/electronic requests [place] to refill. In her case, [facility] faxed a refill request to [place] for Clonazepam, so I assumed that was where she wanted it to go. Thank you.

## 2015-06-28 ENCOUNTER — Ambulatory Visit: Payer: Medicare Other | Admitting: Physical Therapy

## 2015-06-28 DIAGNOSIS — R29898 Other symptoms and signs involving the musculoskeletal system: Secondary | ICD-10-CM

## 2015-06-28 DIAGNOSIS — M546 Pain in thoracic spine: Secondary | ICD-10-CM

## 2015-06-28 NOTE — Therapy (Signed)
[facility] [address]  [place], [place], [address] Phone: [phone]   Fax:  [phone]  Physical Therapy Treatment  Patient Details  Name: Joan [name] MRN: [id number] Date of Birth: 04-Mar-1946 Referring Provider: Kary Kos  Encounter Date: 06/28/2015      PT End of Session - 06/28/15 1544    Visit Number 18   Number of Visits 22   PT Start Time 7169   PT Stop Time 1657   PT Time Calculation (min) 74 min      Past Medical History  Diagnosis Date  . Chronic low back pain     followed by Dr Hardin Negus pain mgt  . Hyperlipemia   . Tobacco abuse   . Hypothyroidism   . History of cardiac arrhythmia     cardiologist- Traci Turner  . Esophageal stricture   . Thyroid nodule   . Colon polyp   . PONV (postoperative nausea and vomiting)     Pt reports symptoms are the result of gallbladder and cholecystectomy, not anesthesia  . Heart murmur     "slight; not on RX" (06/24/2013)  . Sleep apnea 1990's    "tested; tried mask; couldn't stand it; told me as long as I slept on my side I'd be ok" (06/24/2013)  . Osteoarthritis of left knee     advanced  . Arthritis     "shoulders; wrist; probably spine" (06/24/2013)  . GERD (gastroesophageal reflux disease)   . Constipation   . Hypertension   . PVC's (premature ventricular contractions)   . Finger pain, left     2 fingers on left hand since wrist surgery  . Hx of colonic polyps 08/28/2004    Past Surgical History  Procedure Laterality Date  . Lumbar fusion      and rods  . Cesarean section  1972  . Ankle surgery Left 1995    "tendon repair" (06/24/2013)  . Infusion pump implantation  1990's    "implantablet morphine pump; took it out w/in 11 months  . Knee arthroscopy Left 1991; ~ 1993  . Elbow surgery  1990's  . Thyroidectomy  2012  . Cervical spine surgery  2012  . Foot surgery Right 2012    SPUR REMOVED  . Shoulder arthroscopy Left 09/2011  .  Lumbar laminectomy/decompression microdiscectomy N/A 11/28/2012    Procedure: DECOMPRESSIVE LUMBAR LAMINECTOMY LEVEL 1;  Surgeon: Elaina Hoops, MD;  Location: [facility];  Service: Neurosurgery;  Laterality: N/A;  DECOMPRESSIVE LUMBAR LAMINECTOMY LEVEL 1  . Back surgery      "think today was my 8th back OR" (06/24/2013)  . Cholecystectomy N/A 04/16/2013    Procedure: LAPAROSCOPIC CHOLECYSTECTOMY ;  Surgeon: Imogene Burn. Georgette Dover, MD;  Location: [facility];  Service: General;  Laterality: N/A;  . Laparoscopic lysis of adhesions N/A 04/16/2013    Procedure: LAPAROSCOPIC LYSIS OF ADHESIONS;  Surgeon: Imogene Burn. Tsuei, MD;  Location: [facility];  Service: General;  Laterality: N/A;  . Posterior fusion lumbar spine  06/24/2013  . Abdominal hysterectomy  1988    "partial" (06/24/2013)  . Orif distal radius fracture Left 12/30/2013    dr Caralyn Guile  . Orif wrist fracture Left 12/30/2013    Procedure: OPEN REDUCTION INTERNAL FIXATION (ORIF) LEFT WRIST FRACTURE AND REPAIR AS INDICATED;  Surgeon: Linna Hoff, MD;  Location: [facility];  Service: Orthopedics;  Laterality: Left;  . Robotic assisted salpingo oopherectomy Bilateral 08/20/2014    Procedure: ROBOTIC ASSISTED SALPINGO  OOPHORECTOMY;  Surgeon: Daria Pastures, MD;  Location: [facility];  Service: Gynecology;  Laterality: Bilateral;  . Abdominal hysterectomy      partial in 1988    There were no vitals filed for this visit.  Visit Diagnosis:  Bilateral thoracic back pain  Shoulder weakness      Subjective Assessment - 06/28/15 1545    Subjective Pt states she sat at desk a few days ago for 4 hours straight working on computer.  Following this she was in severe pain rating 8/10 when she attempted to get up out of chair.  She states she could hardly walk initially but and pain remained elevated for 3 days but is now back to baseline.  She rates pain 4/10 today.   Currently in Pain? Yes   Pain Score 4    Pain Location Back   Pain Orientation Mid           TODAY'S TREATMENT TherEx - Bridge 15x Diagonal Partial Curl-up with Contra Hip Flex 10x each Reverse Curl-up 10x Seated Trunk Rotation Isometric 10x5" with Green TB Seated HS stretch 3x20" each Seated one-arm Low Row Black TB 15# Seated Hip ABD Black TB 20x Seated B Shoulder Flexion 5# db with focus on neutral trunk   MHP with IFC (80-150Hz ) to B upper L-spine to mid T-spine with pt hooklying x 15'  3 strips kinesiotape to L and T-spine with 75% horiz strip just below bra line        PT Education - 06/28/15 1834    Education provided Yes   Education Details d/c HEP   Person(s) Educated Patient   Methods Explanation;Demonstration;Handout   Comprehension Verbalized understanding;Returned demonstration          PT Short Term Goals - 03/10/15 1609    PT SHORT TERM GOAL #1   Title pt independent in intial HEP   Status Achieved           PT Long Term Goals - 06/28/15 1834    PT LONG TERM GOAL #1   Title pt independent with advanced HEP as necessary by 06/27/15   Status Achieved   PT LONG TERM GOAL #2   Title B shoulder MMT 4+/5 or better without c/o back pain with testing   Status Achieved   PT LONG TERM GOAL #3   Title pt reports able to perform chores and ADLs with greater ease without constant need for back brace and rarely limited by back pain by 06/27/15   Status Achieved               Plan - 06/29/15 0945    Clinical Impression Statement pt has progressed quite well with regard to level of function since beginning PT.  Here FOTO score improved from 57% limitation at initial eval to 34% limitation today.  She is able to perform chores and ADLs without retriction by pain and rarely requires use of back brace.  She does at times push herself too hard or too long with some activities which cause temporary increased pain but overall her pain values have been mostly limted to 3-4/10 with activity lately (AVG pain at initial eval was 5-6/10 and got  up to 8/10 at times).  She is independent with advanced HEP which should help maintain her strength and high level of function; however, given the extent of her back surgies she is likely to require bouts of PT from time to time to assist with maintaining her function and controlling her  pain.  For now, she is being discharged from PT due to meeting goals.   Consulted and Agree with Plan of Care Patient          G-Codes - 07-08-2015 1836    Functional Assessment Tool Used foto 34% limitation   Functional Limitation Mobility: Walking and moving around   Mobility: Walking and Moving Around Current Status [phone]) At least 20 percent but less than 40 percent impaired, limited or restricted   Mobility: Walking and Moving Around Goal Status [phone]) At least 20 percent but less than 40 percent impaired, limited or restricted   Mobility: Walking and Moving Around Discharge Status [phone]) At least 20 percent but less than 40 percent impaired, limited or restricted      Problem List Patient Active Problem List   Diagnosis Date Noted  . Preventative health care 09/08/2014  . Ovarian cyst 08/13/2014  . Preoperative cardiovascular examination 08/13/2014  . Abnormal EKG 08/13/2014  . PVC's (premature ventricular contractions)   . Fracture of left distal radius 12/30/2013  . Spinal stenosis of lumbar region 06/24/2013  . Atherosclerosis of aorta ([place]) 05/06/2013  . Osteopenia 03/06/2013  . Edema 11/05/2012  . Myalgia 04/11/2011  . Night sweats 12/27/2010  . Contact lens/glasses fitting 12/27/2010  . Menopause 12/27/2010  . Family history of breast cancer 12/27/2010  . ADJUSTMENT DISORDER WITH ANXIOUS MOOD 05/26/2010  . PULMONARY NODULE, LEFT UPPER LOBE 12/15/2009  . CONSTIPATION, DRUG INDUCED 08/19/2009  . KNEE PAIN, LEFT 09/13/2008  . THYROID NODULE, LEFT 05/26/2008  . GERD 05/26/2008  . INSOMNIA 05/06/2008  . Anemia 10/24/2007  . Hypothyroidism 08/12/2007  . Hyperlipidemia 08/12/2007  .  TOBACCO ABUSE 08/12/2007  . Essential hypertension 08/12/2007  . DEGENERATIVE JOINT DISEASE, LEFT KNEE 08/12/2007  . Hx of colonic polyps 08/28/2004    [facility] PT, OCS 06/29/2015, 9:52 AM  [facility] [address]  [place], [place], [address] Phone: [phone]   Fax:  [phone]  Name: Joan [name] MRN: [id number] Date of Birth: 1946-01-06  PHYSICAL THERAPY DISCHARGE SUMMARY  Visits from Start of Care: 18  Current functional level related to goals / functional outcomes: All goals met   Remaining deficits: Intermittent back pain   Education / Equipment: HEP Plan: Patient agrees to discharge.  Patient goals were met. Patient is being discharged due to meeting the stated rehab goals.  ?????        Leonette Most PT, OCS 06/29/2015 9:53 AM

## 2015-07-08 ENCOUNTER — Encounter: Payer: Self-pay | Admitting: Family Medicine

## 2015-07-22 ENCOUNTER — Other Ambulatory Visit: Payer: Self-pay | Admitting: Internal Medicine

## 2015-07-25 NOTE — Telephone Encounter (Signed)
Last seen 04/04/15 and filled 06/15/15 #30  Please advise    KP

## 2015-08-16 DIAGNOSIS — H401121 Primary open-angle glaucoma, left eye, mild stage: Secondary | ICD-10-CM | POA: Diagnosis not present

## 2015-08-22 ENCOUNTER — Other Ambulatory Visit: Payer: Self-pay | Admitting: Family Medicine

## 2015-08-22 NOTE — Telephone Encounter (Signed)
Last seen 04/04/15 and filled 07/25/15 #30   Please advise     KP

## 2015-08-29 ENCOUNTER — Ambulatory Visit (HOSPITAL_BASED_OUTPATIENT_CLINIC_OR_DEPARTMENT_OTHER)
Admission: RE | Admit: 2015-08-29 | Discharge: 2015-08-29 | Disposition: A | Discharge status: Home / Self Care | Payer: Medicare Other | Source: Ambulatory Visit | Attending: Family Medicine | Admitting: Family Medicine

## 2015-08-29 ENCOUNTER — Other Ambulatory Visit: Payer: Self-pay | Admitting: Family Medicine

## 2015-08-29 ENCOUNTER — Ambulatory Visit (INDEPENDENT_AMBULATORY_CARE_PROVIDER_SITE_OTHER): Payer: Medicare Other | Admitting: Family Medicine

## 2015-08-29 ENCOUNTER — Encounter: Payer: Self-pay | Admitting: Family Medicine

## 2015-08-29 VITALS — BP 122/70 | HR 78 | Temp 98.3°F | Wt 157.6 lb

## 2015-08-29 DIAGNOSIS — F17200 Nicotine dependence, unspecified, uncomplicated: Secondary | ICD-10-CM | POA: Insufficient documentation

## 2015-08-29 DIAGNOSIS — J449 Chronic obstructive pulmonary disease, unspecified: Secondary | ICD-10-CM | POA: Insufficient documentation

## 2015-08-29 DIAGNOSIS — R079 Chest pain, unspecified: Secondary | ICD-10-CM | POA: Insufficient documentation

## 2015-08-29 DIAGNOSIS — J189 Pneumonia, unspecified organism: Secondary | ICD-10-CM | POA: Diagnosis not present

## 2015-08-29 DIAGNOSIS — J441 Chronic obstructive pulmonary disease with (acute) exacerbation: Secondary | ICD-10-CM

## 2015-08-29 DIAGNOSIS — R509 Fever, unspecified: Secondary | ICD-10-CM | POA: Diagnosis not present

## 2015-08-29 DIAGNOSIS — R059 Cough, unspecified: Secondary | ICD-10-CM

## 2015-08-29 DIAGNOSIS — R05 Cough: Secondary | ICD-10-CM | POA: Diagnosis not present

## 2015-08-29 MED ORDER — PREDNISONE 10 MG PO TABS: ORAL_TABLET | ORAL | Status: DC

## 2015-08-29 MED ORDER — LEVOFLOXACIN 500 MG PO TABS: 500.0000 mg | ORAL_TABLET | Freq: Every day | ORAL | Status: DC

## 2015-08-29 MED ORDER — ALBUTEROL SULFATE HFA 108 (90 BASE) MCG/ACT IN AERS: 2.0000 | INHALATION_SPRAY | Freq: Four times a day (QID) | RESPIRATORY_TRACT | Status: DC | PRN

## 2015-08-29 NOTE — Progress Notes (Signed)
  Antibiotics per medication orders. Antitussives per medication orders. Avoid exposure to tobacco smoke and fumes. Call if shortness of breath worsens, blood in sputum, change in character of cough, development of fever or chills, inability to maintain nutrition and hydration. Avoid exposure to tobacco smoke and fumes. Chest x-ray. Subjective:     Joan [name] is a 70 y.o. female here for evaluation of a cough. Onset of symptoms was 7 days ago. Symptoms have been gradually worsening since that time. The cough is barky and productive and is aggravated by reclining position. Associated symptoms include: chest pain, chills, fever, heartburn, shortness of breath, sputum production and wheezing. Patient does not have a history of asthma. Patient does not have a history of environmental allergens. Patient has not traveled recently. Patient does have a history of smoking. Patient has not had a previous chest x-ray. Patient has not had a PPD done.  The following portions of the patient's history were reviewed and updated as appropriate: allergies, current medications, past family history, past medical history, past social history, past surgical history and problem list.  Review of Systems Pertinent items are noted in HPI.    Objective:    Oxygen saturation 97% on room air BP 122/70 mmHg  Pulse 78  Temp(Src) 98.3 F (36.8 C) (Oral)  Wt 157 lb 9.6 oz (71.487 kg)  SpO2 97% General appearance: alert, cooperative, appears stated age and no distress Ears: normal TM's and external ear canals both ears Nose: green discharge, moderate congestion, turbinates red, swollen Throat: lips, mucosa, and tongue normal; teeth and gums normal Neck: mild anterior cervical adenopathy, supple, symmetrical, trachea midline and thyroid not enlarged, symmetric, no tenderness/mass/nodules Lungs: diminished breath sounds bilaterally Heart: S1, S2 normal Extremities: extremities normal, atraumatic, no cyanosis or  edema    Assessment:    Acute Bronchitis and COPD with exacerbation    Plan:   fu prnAntibiotics per medication orders. Antitussives per medication orders. Avoid exposure to tobacco smoke and fumes. Call if shortness of breath worsens, blood in sputum, change in character of cough, development of fever or chills, inability to maintain nutrition and hydration. Avoid exposure to tobacco smoke and fumes. Chest x-ray. fu prn

## 2015-08-29 NOTE — Progress Notes (Signed)
Pre visit review using our clinic review tool, if applicable. No additional management support is needed unless otherwise documented below in the visit note. 

## 2015-08-29 NOTE — Patient Instructions (Signed)

## 2015-09-02 ENCOUNTER — Encounter: Payer: Self-pay | Admitting: Family Medicine

## 2015-09-02 ENCOUNTER — Ambulatory Visit (INDEPENDENT_AMBULATORY_CARE_PROVIDER_SITE_OTHER): Payer: Medicare Other | Admitting: Family Medicine

## 2015-09-02 VITALS — BP 122/80 | HR 67 | Temp 97.6°F | Ht 61.0 in

## 2015-09-02 DIAGNOSIS — J40 Bronchitis, not specified as acute or chronic: Secondary | ICD-10-CM | POA: Diagnosis not present

## 2015-09-02 NOTE — Progress Notes (Signed)
Pre visit review using our clinic review tool, if applicable. No additional management support is needed unless otherwise documented below in the visit note. 

## 2015-09-02 NOTE — Patient Instructions (Signed)
Follow up w/ Dr Etter Sjogren as needed STOP the albuterol inhaler Finish the antibiotics as directed Call with any questions or concerns Have a great weekend!!!

## 2015-09-02 NOTE — Progress Notes (Signed)
   Subjective:    Patient ID: Joan [name], female    DOB: [birth year], 70 y.o.   MRN: [id number]  HPI COPD f/u- pt was treated for COPD/bronchitis 4 days ago w/ Levaquin, Albuterol, Prednisone.  Pt reports feeling better.  No SOB, cough, wheeze, SOB.  Pt is attempting to quit smoking- now at 10 cigs/day.  Pt is adamant that she didn't have any issues prior to getting sick.  Reports albuterol is not making a difference.   Review of Systems For ROS see HPI     Objective:   Physical Exam  Constitutional: She appears well-developed and well-nourished. No distress.  HENT:  Head: Normocephalic and atraumatic.  Right Ear: Tympanic membrane normal.  Left Ear: Tympanic membrane normal.  Nose: Mucosal edema and rhinorrhea present. Right sinus exhibits no maxillary sinus tenderness and no frontal sinus tenderness. Left sinus exhibits no maxillary sinus tenderness and no frontal sinus tenderness.  Mouth/Throat: Mucous membranes are normal. Posterior oropharyngeal erythema (w/ PND) present.  Eyes: Conjunctivae and EOM are normal. Pupils are equal, round, and reactive to light.  Neck: Normal range of motion. Neck supple.  Cardiovascular: Normal rate, regular rhythm and normal heart sounds.   Pulmonary/Chest: Effort normal and breath sounds normal. No respiratory distress. She has no wheezes. She has no rales.  Lymphadenopathy:    She has no cervical adenopathy.  Vitals reviewed.         Assessment & Plan:

## 2015-09-04 NOTE — Assessment & Plan Note (Signed)
New to provider.  Pt reports she is feeling much better since taking her abx for bronchitis/COPD exacerbation.  She was angered at the dx of COPD and wants to know where that came from.  Reviewed xray results w/ her that states she has hyperinflation consistent w/ COPD but she is adamant that this was due to her infxn at the time.  Encouraged her to speak w/ her PCP about this issue as this is our first meeting.  She can stop the albuterol inhaler.  She is to complete abx as directed.  Pt expressed understanding and is in agreement w/ plan.

## 2015-09-07 ENCOUNTER — Telehealth: Payer: Self-pay | Admitting: Family Medicine

## 2015-09-07 MED ORDER — SIMVASTATIN 20 MG PO TABS: 20.0000 mg | ORAL_TABLET | Freq: Every day | ORAL | Status: DC

## 2015-09-07 NOTE — Telephone Encounter (Signed)
Pharmacy: [facility] mail order  Reason for call: pt needing simvastatin. She has 10 days on hand. Please send to mail order in 90 day supply.

## 2015-09-07 NOTE — Telephone Encounter (Signed)
Rx faxed.    KP 

## 2015-09-13 ENCOUNTER — Ambulatory Visit: Payer: Medicare Other

## 2015-09-21 ENCOUNTER — Ambulatory Visit: Payer: Medicare Other

## 2015-09-22 ENCOUNTER — Other Ambulatory Visit: Payer: Self-pay | Admitting: Family Medicine

## 2015-09-22 DIAGNOSIS — Z79891 Long term (current) use of opiate analgesic: Secondary | ICD-10-CM | POA: Diagnosis not present

## 2015-09-22 DIAGNOSIS — M62830 Muscle spasm of back: Secondary | ICD-10-CM | POA: Diagnosis not present

## 2015-09-22 DIAGNOSIS — G894 Chronic pain syndrome: Secondary | ICD-10-CM | POA: Diagnosis not present

## 2015-09-22 DIAGNOSIS — M961 Postlaminectomy syndrome, not elsewhere classified: Secondary | ICD-10-CM | POA: Diagnosis not present

## 2015-09-22 NOTE — Telephone Encounter (Signed)
Pt was last seen 08/29/15 Last refill 08/22/15.  Please advise on refill.

## 2015-09-23 MED ORDER — CLONAZEPAM 1 MG PO TABS: 1.0000 mg | ORAL_TABLET | Freq: Every day | ORAL | Status: DC

## 2015-09-23 NOTE — Addendum Note (Signed)
Addended by: Tasia Catchings on: 09/23/2015 09:14 AM   Modules accepted: Orders

## 2015-09-28 ENCOUNTER — Ambulatory Visit (INDEPENDENT_AMBULATORY_CARE_PROVIDER_SITE_OTHER): Payer: Medicare Other

## 2015-09-28 VITALS — BP 122/70 | HR 81 | Ht 60.0 in | Wt 159.6 lb

## 2015-09-28 DIAGNOSIS — Z0000 Encounter for general adult medical examination without abnormal findings: Secondary | ICD-10-CM | POA: Diagnosis not present

## 2015-09-28 NOTE — Progress Notes (Signed)
Pre visit review using our clinic review tool, if applicable. No additional management support is needed unless otherwise documented below in the visit note. 

## 2015-09-28 NOTE — Progress Notes (Signed)
Note reviewed.

## 2015-09-28 NOTE — Progress Notes (Signed)
Subjective:   Joan [name] is a 70 y.o. female who presents for Medicare Annual (Subsequent) preventive examination.  Review of Systems: No ROS Cardiac Risk Factors include: advanced age (>10men, >45 women);hypertension;sedentary lifestyle;obesity (BMI >30kg/m2);smoking/ tobacco exposure Sleep patterns:  Sleeps on average 4 hours per night   Home Safety/Smoke Alarms:  Feels safe at home.  Lives alone in 2 story home.  Bedroom on lower level.  Alarm system and smoke alarms present.   Firearm Safety: Kept in safe place.   Seat Belt Safety/Bike Helmet:  Always wears seat belt.    Counseling:   Dental- Goes every 6 months.   Female: Pap-Hysterectomy   Mammo-  04/22/15   Dexa scan- 05/25/15-osteopenia      CCS-03/28/15       Objective:     Vitals: BP 122/70 mmHg  Pulse 81  Ht 5' (1.524 m)  Wt 159 lb 9.6 oz (72.394 kg)  BMI 31.17 kg/m2  SpO2 96%  Body mass index is 31.17 kg/(m^2).   Tobacco History  Smoking status  . Current Every Day Smoker -- 1.00 packs/day for 35 years  . Types: Cigarettes  Smokeless tobacco  . Never Used     Ready to quit: Yes Counseling given: Yes   Past Medical History  Diagnosis Date  . Chronic low back pain     followed by Dr Hardin Negus pain mgt  . Hyperlipemia   . Tobacco abuse   . Hypothyroidism   . History of cardiac arrhythmia     cardiologist- Traci Turner  . Esophageal stricture   . Thyroid nodule   . Colon polyp   . PONV (postoperative nausea and vomiting)     Pt reports symptoms are the result of gallbladder and cholecystectomy, not anesthesia  . Heart murmur     "slight; not on RX" (06/24/2013)  . Sleep apnea 1990's    "tested; tried mask; couldn't stand it; told me as long as I slept on my side I'd be ok" (06/24/2013)  . Osteoarthritis of left knee     advanced  . Arthritis     "shoulders; wrist; probably spine" (06/24/2013)  . GERD (gastroesophageal reflux disease)   . Constipation   . Hypertension   . PVC's (premature  ventricular contractions)   . Finger pain, left     2 fingers on left hand since wrist surgery  . Hx of colonic polyps 08/28/2004   Past Surgical History  Procedure Laterality Date  . Lumbar fusion      and rods  . Cesarean section  1972  . Ankle surgery Left 1995    "tendon repair" (06/24/2013)  . Infusion pump implantation  1990's    "implantablet morphine pump; took it out w/in 11 months  . Knee arthroscopy Left 1991; ~ 1993  . Elbow surgery  1990's  . Thyroidectomy  2012  . Cervical spine surgery  2012  . Foot surgery Right 2012    SPUR REMOVED  . Shoulder arthroscopy Left 09/2011  . Lumbar laminectomy/decompression microdiscectomy N/A 11/28/2012    Procedure: DECOMPRESSIVE LUMBAR LAMINECTOMY LEVEL 1;  Surgeon: Elaina Hoops, MD;  Location: [facility];  Service: Neurosurgery;  Laterality: N/A;  DECOMPRESSIVE LUMBAR LAMINECTOMY LEVEL 1  . Back surgery      "think today was my 8th back OR" (06/24/2013)  . Cholecystectomy N/A 04/16/2013    Procedure: LAPAROSCOPIC CHOLECYSTECTOMY ;  Surgeon: Imogene Burn. Georgette Dover, MD;  Location: [facility];  Service: General;  Laterality: N/A;  .  Laparoscopic lysis of adhesions N/A 04/16/2013    Procedure: LAPAROSCOPIC LYSIS OF ADHESIONS;  Surgeon: Imogene Burn. Tsuei, MD;  Location: [facility];  Service: General;  Laterality: N/A;  . Posterior fusion lumbar spine  06/24/2013  . Abdominal hysterectomy  1988    "partial" (06/24/2013)  . Orif distal radius fracture Left 12/30/2013    dr Caralyn Guile  . Orif wrist fracture Left 12/30/2013    Procedure: OPEN REDUCTION INTERNAL FIXATION (ORIF) LEFT WRIST FRACTURE AND REPAIR AS INDICATED;  Surgeon: Linna Hoff, MD;  Location: [facility];  Service: Orthopedics;  Laterality: Left;  . Robotic assisted salpingo oopherectomy Bilateral 08/20/2014    Procedure: ROBOTIC ASSISTED SALPINGO OOPHORECTOMY;  Surgeon: Daria Pastures, MD;  Location: [facility];  Service: Gynecology;  Laterality: Bilateral;  . Abdominal hysterectomy      partial  in 1988   Family History  Problem Relation Age of Onset  . Pancreatic cancer Father     deceased age 87  . Cancer Father   . Diabetes type II Mother     deceased age 67  . Hypertension Mother   . Coronary artery disease Mother   . Diabetes Mother   . Cancer Mother     Thyroid and Skin  . Diabetes Sister   . Hypertension Sister   . Cancer Sister     Breast  . Bone cancer Sister   . Breast cancer Sister   . Other Neg Hx     No family history of  colon cancer  . Hypertension Brother    History  Sexual Activity  . Sexual Activity: Not Currently    Outpatient Encounter Prescriptions as of 09/28/2015  Medication Sig  . bisacodyl (DULCOLAX) 5 MG EC tablet Take 5-10 mg by mouth at bedtime as needed for mild constipation (depending on amount of pain medication taken).   . carisoprodol (SOMA) 350 MG tablet Take 350 mg by mouth 3 (three) times daily. For muscle spasms  . Cholecalciferol (VITAMIN D3) 5000 UNITS TABS Take 5,000 Units by mouth every other day.   . clonazePAM (KLONOPIN) 1 MG tablet Take 1 tablet (1 mg total) by mouth daily with breakfast. (Patient taking differently: Take 1 mg by mouth at bedtime. )  . Cyanocobalamin (VITAMIN B-12) 2500 MCG SUBL Place 2,500 mcg under the tongue every other day.   . dorzolamide-timolol (COSOPT) 22.3-6.8 MG/ML ophthalmic solution Place 1 drop into the left eye 2 (two) times daily.  Marland Kitchen estradiol (ESTRACE) 0.5 MG tablet TAKE 1 BY MOUTH DAILY  . Flaxseed, Linseed, (FLAX SEED OIL) 1000 MG CAPS Take 2,000 mg by mouth every other day.   . furosemide (LASIX) 20 MG tablet Take 1 tablet (20 mg total) by mouth daily as needed for fluid.  Marland Kitchen glucosamine-chondroitin 500-400 MG tablet Take 2 tablets by mouth every other day.   . hydrochlorothiazide (HYDRODIURIL) 25 MG tablet Take 1 tablet (25 mg total) by mouth daily.  Marland Kitchen HYDROcodone-acetaminophen (NORCO) 10-325 MG per tablet Take 1 tablet by mouth every 4 (four) hours. For pain (Patient taking differently:  Take 1 tablet by mouth every 5 (five) hours as needed. For pain)  . levothyroxine (SYNTHROID, LEVOTHROID) 88 MCG tablet Take 1 tablet (88 mcg total) by mouth daily before breakfast.  . magnesium gluconate (MAGONATE) 500 MG tablet Take 500 mg by mouth daily.  . NON FORMULARY Super b energy complex-Take one every other day.  . Omega-3 Fatty Acids (FISH OIL) 1000 MG CAPS Take 2,000 mg by mouth every other  day.   . omeprazole (PRILOSEC) 40 MG capsule Take 1 capsule (40 mg total) by mouth daily.  . potassium chloride SA (K-DUR,KLOR-CON) 20 MEQ tablet Take 3 tablets (60 mEq total) by mouth daily.  . simvastatin (ZOCOR) 20 MG tablet Take 1 tablet (20 mg total) by mouth at bedtime.  . triamcinolone cream (KENALOG) 0.1 % Apply 1 application topically daily.   . vitamin C (ASCORBIC ACID) 500 MG tablet Take 1 tablet (500 mg total) by mouth daily.  . [DISCONTINUED] albuterol (PROAIR HFA) 108 (90 Base) MCG/ACT inhaler Inhale 2 puffs into the lungs every 6 (six) hours as needed for wheezing or shortness of breath. (Patient not taking: Reported on 09/28/2015)  . [DISCONTINUED] fenofibrate 160 MG tablet Take 1 tablet (160 mg total) by mouth daily. (Patient not taking: Reported on 09/28/2015)  . [DISCONTINUED] levofloxacin (LEVAQUIN) 500 MG tablet Take 1 tablet (500 mg total) by mouth daily. (Patient not taking: Reported on 09/28/2015)  . [DISCONTINUED] predniSONE (DELTASONE) 10 MG tablet 3 po qd for 3 days then 2 po qd for 3 days the 1 po qd for 3 days (Patient not taking: Reported on 09/28/2015)   No facility-administered encounter medications on file as of 09/28/2015.    Activities of Daily Living In your present state of health, do you have any difficulty performing the following activities: 09/28/2015 09/02/2015  Hearing? N -  Vision? Y -  Difficulty concentrating or making decisions? N -  Walking or climbing stairs? N -  Dressing or bathing? N Y  Doing errands, shopping? N N  Preparing Food and eating ? N -    Using the Toilet? N -  In the past six months, have you accidently leaked urine? N -  Do you have problems with loss of bowel control? N -  Managing your Medications? N -  Managing your Finances? N -  Housekeeping or managing your Housekeeping? N -    Patient Care Team: Rosalita Chessman, DO as PCP - General (Family Medicine) Nicholaus Bloom, MD as Consulting Physician (Anesthesiology) Kary Kos, MD as Consulting Physician (Neurosurgery) Iran Planas, MD as Consulting Physician (Orthopedic Surgery) Justice Britain, MD as Consulting Physician (Orthopedic Surgery) Jarome Matin, MD as Consulting Physician (Dermatology) Bobbye Charleston, MD as Consulting Physician (Obstetrics and Gynecology) Barbaraann Cao, OD as Referring Physician (Optometry) Gatha Mayer, MD as Consulting Physician (Gastroenterology)  Leana Roe Turner-Cardiology    Assessment:  Assessment deferred to PCP.    Exercise Activities and Dietary recommendations Current Exercise Habits: The patient does not participate in regular exercise at present  Diet:  Eats 1-2 meals per day.  Snacks.  Relatively healthy.  Some fried foods.  Goals    . Increase physical activity     Start back doing the exercises for back.       Fall Risk Fall Risk  09/28/2015 09/02/2015 09/08/2014 08/17/2013  Falls in the past year? No No Yes No  Number falls in past yr: - - 1 -  Injury with Fall? - - Yes -  Risk for fall due to : Impaired mobility;Impaired balance/gait - - -   Depression Screen PHQ 2/9 Scores 09/28/2015 09/02/2015 09/08/2014 08/17/2013  PHQ - 2 Score 3 0 0 0  PHQ- 9 Score 8 - - -  Exception Documentation - Patient refusal - -     Cognitive Testing MMSE - Mini Mental State Exam 09/28/2015  Orientation to time 5  Orientation to Place 5  Registration 3  Attention/ Calculation 5  Recall 3  Language- name 2 objects 2  Language- repeat 1  Language- follow 3 step command 3  Language- read & follow direction 1  Write a sentence 1   Copy design 1  Total score 30    Immunization History  Administered Date(s) Administered  . Influenza Split 03/28/2012  . Influenza Whole 04/28/2007, 05/25/2008, 04/19/2009, 04/18/2010  . Influenza, High Dose Seasonal PF 04/13/2014, 04/04/2015  . Influenza,inj,Quad PF,36+ Mos 03/06/2013  . Pneumococcal Conjugate-13 06/07/2014  . Pneumococcal Polysaccharide-23 08/12/2007  . Td 10/23/2004, 09/08/2014  . Zoster 03/28/2012   Screening Tests Health Maintenance  Topic Date Due  . PNA vac Low Risk Adult (2 of 2 - PPSV23) 06/08/2015  . INFLUENZA VACCINE  02/07/2016  . MAMMOGRAM  04/21/2017  . COLONOSCOPY  03/27/2020  . TETANUS/TDAP  09/07/2024  . DEXA SCAN  Completed  . ZOSTAVAX  Completed  . Hepatitis C Screening  Completed      Plan:  Check with insurance company about coverage for Pneumonia 23 vaccine.    Continue doing brain stimulating activities (puzzles, reading, adult coloring books, staying active) to keep memory sharp.   Eat heart healthy diet (full of fruits, vegetables, whole grains, lean protein, water--limit salt, fat, and sugar intake) and increase physical activity as tolerated.  Enjoy the beach.  Take time to grieve.  Follow up with Dr. Etter Sjogren as scheduled.      During the course of the visit the patient was educated and counseled about the following appropriate screening and preventive services:   Vaccines to include Pneumoccal, Influenza, Hepatitis B, Td, Zostavax, HCV  Electrocardiogram  Cardiovascular Disease  Colorectal cancer screening  Bone density screening  Diabetes screening  Glaucoma screening  Mammography/PAP  Nutrition counseling   Patient Instructions (the written plan) was given to the patient.   Rudene Anda, RN  09/28/2015

## 2015-09-28 NOTE — Patient Instructions (Addendum)
Check with insurance company about coverage for Pneumonia 23 vaccine.    Continue doing brain stimulating activities (puzzles, reading, adult coloring books, staying active) to keep memory sharp.   Eat heart healthy diet (full of fruits, vegetables, whole grains, lean protein, water--limit salt, fat, and sugar intake) and increase physical activity as tolerated.  Enjoy the beach.  Take time to grieve.  Follow up with Joan [name] as scheduled.    Fall Prevention in the Home  Falls can cause injuries. They can happen to people of all ages. There are many things you can do to make your home safe and to help prevent falls.  WHAT CAN I DO ON THE OUTSIDE OF MY HOME?  Regularly fix the edges of walkways and driveways and fix any cracks.  Remove anything that might make you trip as you walk through a door, such as a raised step or threshold.  Trim any bushes or trees on the path to your home.  Use bright outdoor lighting.  Clear any walking paths of anything that might make someone trip, such as rocks or tools.  Regularly check to see if handrails are loose or broken. Make sure that both sides of any steps have handrails.  Any raised decks and porches should have guardrails on the edges.  Have any leaves, snow, or ice cleared regularly.  Use sand or salt on walking paths during winter.  Clean up any spills in your garage right away. This includes oil or grease spills. WHAT CAN I DO IN THE BATHROOM?   Use night lights.  Install grab bars by the toilet and in the tub and shower. Do not use towel bars as grab bars.  Use non-skid mats or decals in the tub or shower.  If you need to sit down in the shower, use a plastic, non-slip stool.  Keep the floor dry. Clean up any water that spills on the floor as soon as it happens.  Remove soap buildup in the tub or shower regularly.  Attach bath mats securely with double-sided non-slip rug tape.  Do not have throw rugs and other things on  the floor that can make you trip. WHAT CAN I DO IN THE BEDROOM?  Use night lights.  Make sure that you have a light by your bed that is easy to reach.  Do not use any sheets or blankets that are too big for your bed. They should not hang down onto the floor.  Have a firm chair that has side arms. You can use this for support while you get dressed.  Do not have throw rugs and other things on the floor that can make you trip. WHAT CAN I DO IN THE KITCHEN?  Clean up any spills right away.  Avoid walking on wet floors.  Keep items that you use a lot in easy-to-reach places.  If you need to reach something above you, use a strong step stool that has a grab bar.  Keep electrical cords out of the way.  Do not use floor polish or wax that makes floors slippery. If you must use wax, use non-skid floor wax.  Do not have throw rugs and other things on the floor that can make you trip. WHAT CAN I DO WITH MY STAIRS?  Do not leave any items on the stairs.  Make sure that there are handrails on both sides of the stairs and use them. Fix handrails that are broken or loose. Make sure that handrails are as  long as the stairways.  Check any carpeting to make sure that it is firmly attached to the stairs. Fix any carpet that is loose or worn.  Avoid having throw rugs at the top or bottom of the stairs. If you do have throw rugs, attach them to the floor with carpet tape.  Make sure that you have a light switch at the top of the stairs and the bottom of the stairs. If you do not have them, ask someone to add them for you. WHAT ELSE CAN I DO TO HELP PREVENT FALLS?  Wear shoes that:  Do not have high heels.  Have rubber bottoms.  Are comfortable and fit you well.  Are closed at the toe. Do not wear sandals.  If you use a stepladder:  Make sure that it is fully opened. Do not climb a closed stepladder.  Make sure that both sides of the stepladder are locked into place.  Ask someone to  hold it for you, if possible.  Clearly mark and make sure that you can see:  Any grab bars or handrails.  First and last steps.  Where the edge of each step is.  Use tools that help you move around (mobility aids) if they are needed. These include:  Canes.  Walkers.  Scooters.  Crutches.  Turn on the lights when you go into a dark area. Replace any light bulbs as soon as they burn out.  Set up your furniture so you have a clear path. Avoid moving your furniture around.  If any of your floors are uneven, fix them.  If there are any pets around you, be aware of where they are.  Review your medicines with your doctor. Some medicines can make you feel dizzy. This can increase your chance of falling. Ask your doctor what other things that you can do to help prevent falls.   This information is not intended to replace advice given to you by your health care provider. Make sure you discuss any questions you have with your health care provider.   Document Released: 04/21/2009 Document Revised: 11/09/2014 Document Reviewed: 07/30/2014 Elsevier Interactive Patient Education 2016 [employer].  Steps to Quit Smoking  Smoking tobacco can be harmful to your health and can affect almost every organ in your body. Smoking puts you, and those around you, at risk for developing many serious chronic diseases. Quitting smoking is difficult, but it is one of the best things that you can do for your health. It is never too late to quit. WHAT ARE THE BENEFITS OF QUITTING SMOKING? When you quit smoking, you lower your risk of developing serious diseases and conditions, such as:  Lung cancer or lung disease, such as COPD.  Heart disease.  Stroke.  Heart attack.  Infertility.  Osteoporosis and bone fractures. Additionally, symptoms such as coughing, wheezing, and shortness of breath may get better when you quit. You may also find that you get sick less often because your body is stronger  at fighting off colds and infections. If you are pregnant, quitting smoking can help to reduce your chances of having a baby of low birth weight. HOW DO I GET READY TO QUIT? When you decide to quit smoking, create a plan to make sure that you are successful. Before you quit:  Pick a date to quit. Set a date within the next two weeks to give you time to prepare.  Write down the reasons why you are quitting. Keep this list in places  where you will see it often, such as on your bathroom mirror or in your car or wallet.  Identify the people, places, things, and activities that make you want to smoke (triggers) and avoid them. Make sure to take these actions:  Throw away all cigarettes at home, at work, and in your car.  Throw away smoking accessories, such as Scientist, research (medical).  Clean your car and make sure to empty the ashtray.  Clean your home, including curtains and carpets.  Tell your family, friends, and coworkers that you are quitting. Support from your loved ones can make quitting easier.  Talk with your health care provider about your options for quitting smoking.  Find out what treatment options are covered by your health insurance. WHAT STRATEGIES CAN I USE TO QUIT SMOKING?  Talk with your healthcare provider about different strategies to quit smoking. Some strategies include:  Quitting smoking altogether instead of gradually lessening how much you smoke over a period of time. Research shows that quitting "cold [place]" is more successful than gradually quitting.  Attending in-person counseling to help you build problem-solving skills. You are more likely to have success in quitting if you attend several counseling sessions. Even short sessions of 10 minutes can be effective.  Finding resources and support systems that can help you to quit smoking and remain smoke-free after you quit. These resources are most helpful when you use them often. They can include:  Online chats  with a Social worker.  Telephone quitlines.  Printed Furniture conservator/restorer.  Support groups or group counseling.  Text messaging programs.  Mobile phone applications.  Taking medicines to help you quit smoking. (If you are pregnant or breastfeeding, talk with your health care provider first.) Some medicines contain nicotine and some do not. Both types of medicines help with cravings, but the medicines that include nicotine help to relieve withdrawal symptoms. Your health care provider may recommend:  Nicotine patches, gum, or lozenges.  Nicotine inhalers or sprays.  Non-nicotine medicine that is taken by mouth. Talk with your health care provider about combining strategies, such as taking medicines while you are also receiving in-person counseling. Using these two strategies together makes you more likely to succeed in quitting than if you used either strategy on its own. If you are pregnant or breastfeeding, talk with your health care provider about finding counseling or other support strategies to quit smoking. Do not take medicine to help you quit smoking unless told to do so by your health care provider. WHAT THINGS CAN I DO TO MAKE IT EASIER TO QUIT? Quitting smoking might feel overwhelming at first, but there is a lot that you can do to make it easier. Take these important actions:  Reach out to your family and friends and ask that they support and encourage you during this time. Call telephone quitlines, reach out to support groups, or work with a counselor for support.  Ask people who smoke to avoid smoking around you.  Avoid places that trigger you to smoke, such as bars, parties, or smoke-break areas at work.  Spend time around people who do not smoke.  Lessen stress in your life, because stress can be a smoking trigger for some people. To lessen stress, try:  Exercising regularly.  Deep-breathing exercises.  Yoga.  Meditating.  Performing a body scan. This involves  closing your eyes, scanning your body from head to toe, and noticing which parts of your body are particularly tense. Purposefully relax the muscles in those areas.  Download or purchase mobile phone or tablet apps (applications) that can help you stick to your quit plan by providing reminders, tips, and encouragement. There are many free apps, such as QuitGuide from the [employer] Office manager for Disease Control and Prevention). You can find other support for quitting smoking (smoking cessation) through smokefree.gov and other websites. HOW WILL I FEEL WHEN I QUIT SMOKING? Within the first 24 hours of quitting smoking, you may start to feel some withdrawal symptoms. These symptoms are usually most noticeable 2-3 days after quitting, but they usually do not last beyond 2-3 weeks. Changes or symptoms that you might experience include:  Mood swings.  Restlessness, anxiety, or irritation.  Difficulty concentrating.  Dizziness.  Strong cravings for sugary foods in addition to nicotine.  Mild weight gain.  Constipation.  Nausea.  Coughing or a sore throat.  Changes in how your medicines work in your body.  A depressed mood.  Difficulty sleeping (insomnia). After the first 2-3 weeks of quitting, you may start to notice more positive results, such as:  Improved sense of smell and taste.  Decreased coughing and sore throat.  Slower heart rate.  Lower blood pressure.  Clearer skin.  The ability to breathe more easily.  Fewer sick days. Quitting smoking is very challenging for most people. Do not get discouraged if you are not successful the first time. Some people need to make many attempts to quit before they achieve long-term success. Do your best to stick to your quit plan, and talk with your health care provider if you have any questions or concerns.   This information is not intended to replace advice given to you by your health care provider. Make sure you discuss any questions you  have with your health care provider.   Document Released: 06/19/2001 Document Revised: 11/09/2014 Document Reviewed: 11/09/2014 Elsevier Interactive Patient Education 2016 [place] Can Quit Smoking If you are ready to quit smoking or are thinking about it, congratulations! You have chosen to help yourself be healthier and live longer! There are lots of different ways to quit smoking. Nicotine gum, nicotine patches, a nicotine inhaler, or nicotine nasal spray can help with physical craving. Hypnosis, support groups, and medicines help break the habit of smoking. TIPS TO GET OFF AND STAY OFF CIGARETTES  Learn to predict your moods. Do not let a bad situation be your excuse to have a cigarette. Some situations in your life might tempt you to have a cigarette.  Ask friends and co-workers not to smoke around you.  Make your home smoke-free.  Never have "just one" cigarette. It leads to wanting another and another. Remind yourself of your decision to quit.  On a card, make a list of your reasons for not smoking. Read it at least the same number of times a day as you have a cigarette. Tell yourself everyday, "I do not want to smoke. I choose not to smoke."  Ask someone at home or work to help you with your plan to quit smoking.  Have something planned after you eat or have a cup of coffee. Take a walk or get other exercise to perk you up. This will help to keep you from overeating.  Try a relaxation exercise to calm you down and decrease your stress. Remember, you may be tense and nervous the first two weeks after you quit. This will pass.  Find new activities to keep your hands busy. Play with a pen, coin, or rubber band. Doodle or  draw things on paper.  Brush your teeth right after eating. This will help cut down the craving for the taste of tobacco after meals. You can try mouthwash too.  Try gum, breath mints, or diet candy to keep something in your mouth. IF YOU SMOKE AND WANT  TO QUIT:  Do not stock up on cigarettes. Never buy a carton. Wait until one pack is finished before you buy another.  Never carry cigarettes with you at work or at home.  Keep cigarettes as far away from you as possible. Leave them with someone else.  Never carry matches or a lighter with you.  Ask yourself, "Do I need this cigarette or is this just a reflex?"  Bet with someone that you can quit. Put cigarette money in a piggy bank every morning. If you smoke, you give up the money. If you do not smoke, by the end of the week, you keep the money.  Keep trying. It takes 21 days to change a habit!  Talk to your doctor about using medicines to help you quit. These include nicotine replacement gum, lozenges, or skin patches.   This information is not intended to replace advice given to you by your health care provider. Make sure you discuss any questions you have with your health care provider.   Document Released: 04/21/2009 Document Revised: 09/17/2011 Document Reviewed: 04/21/2009 Elsevier Interactive Patient Education [employer].

## 2015-10-04 ENCOUNTER — Ambulatory Visit: Payer: Medicare Other | Admitting: Family Medicine

## 2015-10-18 ENCOUNTER — Ambulatory Visit (INDEPENDENT_AMBULATORY_CARE_PROVIDER_SITE_OTHER): Payer: Medicare Other | Admitting: Family Medicine

## 2015-10-18 ENCOUNTER — Encounter: Payer: Self-pay | Admitting: Family Medicine

## 2015-10-18 VITALS — BP 138/86 | HR 75 | Temp 98.0°F | Ht 60.0 in | Wt 163.8 lb

## 2015-10-18 DIAGNOSIS — Z23 Encounter for immunization: Secondary | ICD-10-CM

## 2015-10-18 DIAGNOSIS — F4321 Adjustment disorder with depressed mood: Secondary | ICD-10-CM | POA: Diagnosis not present

## 2015-10-18 DIAGNOSIS — R600 Localized edema: Secondary | ICD-10-CM | POA: Diagnosis not present

## 2015-10-18 MED ORDER — SPIRONOLACTONE 25 MG PO TABS: 25.0000 mg | ORAL_TABLET | Freq: Every day | ORAL | Status: DC

## 2015-10-18 MED ORDER — CLONAZEPAM 1 MG PO TABS: 1.0000 mg | ORAL_TABLET | Freq: Every day | ORAL | Status: DC

## 2015-10-18 NOTE — Progress Notes (Signed)
Patient ID: Joan [name], female    DOB: [birth year]  Age: 70 y.o. MRN: [id number]    Subjective:  Subjective HPI Joan [name] presents c/o anxiety and stress after the death of Joan [name] sister.  She had breast cancer stage 4 and died a few months later.  She had not had a mammogram in 10 year.    Review of Systems  Constitutional: Negative for diaphoresis, appetite change, fatigue and unexpected weight change.  Eyes: Negative for pain, redness and visual disturbance.  Respiratory: Negative for cough, chest tightness, shortness of breath and wheezing.   Cardiovascular: Negative for chest pain, palpitations and leg swelling.  Endocrine: Negative for cold intolerance, heat intolerance, polydipsia, polyphagia and polyuria.  Genitourinary: Negative for dysuria, frequency and difficulty urinating.  Neurological: Negative for dizziness, light-headedness, numbness and headaches.    History Past Medical History  Diagnosis Date  . Chronic low back pain     followed by Dr Hardin Negus pain mgt  . Hyperlipemia   . Tobacco abuse   . Hypothyroidism   . History of cardiac arrhythmia     cardiologist- Traci Turner  . Esophageal stricture   . Thyroid nodule   . Colon polyp   . PONV (postoperative nausea and vomiting)     Pt reports symptoms are the result of gallbladder and cholecystectomy, not anesthesia  . Heart murmur     "slight; not on RX" (06/24/2013)  . Sleep apnea 1990's    "tested; tried mask; couldn't stand it; told me as long as I slept on my side I'd be ok" (06/24/2013)  . Osteoarthritis of left knee     advanced  . Arthritis     "shoulders; wrist; probably spine" (06/24/2013)  . GERD (gastroesophageal reflux disease)   . Constipation   . Hypertension   . PVC's (premature ventricular contractions)   . Finger pain, left     2 fingers on left hand since wrist surgery  . Hx of colonic polyps 08/28/2004    She has past surgical history that includes Lumbar fusion; Cesarean  section (1972); Ankle surgery (Left, 1995); Infusion pump implantation (1990's); Knee arthroscopy (Left, 1991; ~ 1993); Elbow surgery (1990's); Thyroidectomy (2012); Cervical spine surgery (2012); Foot surgery (Right, 2012); Shoulder arthroscopy (Left, 09/2011); Lumbar laminectomy/decompression microdiscectomy (N/A, 11/28/2012); Back surgery; Cholecystectomy (N/A, 04/16/2013); Laparoscopic lysis of adhesions (N/A, 04/16/2013); Posterior fusion lumbar spine (06/24/2013); Abdominal hysterectomy (1988); ORIF distal radius fracture (Left, 12/30/2013); ORIF wrist fracture (Left, 12/30/2013); Robotic assisted salpingo oophorectomy (Bilateral, 08/20/2014); and Abdominal hysterectomy.   Joan [name] family history includes Bone cancer in Joan [name] sister; Breast cancer in Joan [name] sister; Cancer in Joan [name] father, mother, and sister; Coronary artery disease in Joan [name] mother; Diabetes in Joan [name] mother and sister; Diabetes type II in Joan [name] mother; Hypertension in Joan [name] brother, mother, and sister; Pancreatic cancer in Joan [name] father. There is no history of Other.She reports that she has been smoking Cigarettes.  She has a 35 pack-year smoking history. She has never used smokeless tobacco. She reports that she does not drink alcohol or use illicit drugs.  Current Outpatient Prescriptions on File Prior to Visit  Medication Sig Dispense Refill  . bisacodyl (DULCOLAX) 5 MG EC tablet Take 5-10 mg by mouth at bedtime as needed for mild constipation (depending on amount of pain medication taken).  30 tablet   . carisoprodol (SOMA) 350 MG tablet Take 350 mg by mouth 3 (three) times daily. For muscle spasms    . Cholecalciferol (VITAMIN D3) 5000 UNITS TABS Take 5,000  Units by mouth every other day.     . Cyanocobalamin (VITAMIN B-12) 2500 MCG SUBL Place 2,500 mcg under the tongue every other day.     . dorzolamide-timolol (COSOPT) 22.3-6.8 MG/ML ophthalmic solution Place 1 drop into the left eye 2 (two) times daily.    Marland Kitchen estradiol (ESTRACE) 0.5 MG tablet TAKE 1 BY  MOUTH DAILY 90 tablet 1  . Flaxseed, Linseed, (FLAX SEED OIL) 1000 MG CAPS Take 2,000 mg by mouth every other day.     Marland Kitchen glucosamine-chondroitin 500-400 MG tablet Take 2 tablets by mouth every other day.     . hydrochlorothiazide (HYDRODIURIL) 25 MG tablet Take 1 tablet (25 mg total) by mouth daily. 90 tablet 0  . HYDROcodone-acetaminophen (NORCO) 10-325 MG per tablet Take 1 tablet by mouth every 4 (four) hours. For pain (Patient taking differently: Take 1 tablet by mouth every 5 (five) hours as needed. For pain) 30 tablet 0  . levothyroxine (SYNTHROID, LEVOTHROID) 88 MCG tablet Take 1 tablet (88 mcg total) by mouth daily before breakfast. 90 tablet 2  . magnesium gluconate (MAGONATE) 500 MG tablet Take 500 mg by mouth daily.    . NON FORMULARY Super b energy complex-Take one every other day.    . Omega-3 Fatty Acids (FISH OIL) 1000 MG CAPS Take 2,000 mg by mouth every other day.     Marland Kitchen omeprazole (PRILOSEC) 40 MG capsule Take 1 capsule (40 mg total) by mouth daily. 90 capsule 1  . potassium chloride SA (K-DUR,KLOR-CON) 20 MEQ tablet Take 3 tablets (60 mEq total) by mouth daily. 270 tablet 1  . simvastatin (ZOCOR) 20 MG tablet Take 1 tablet (20 mg total) by mouth at bedtime. 90 tablet 1  . triamcinolone cream (KENALOG) 0.1 % Apply 2 application topically daily.     . vitamin C (ASCORBIC ACID) 500 MG tablet Take 1 tablet (500 mg total) by mouth daily. (Patient taking differently: Take 500 mg by mouth every other day. ) 50 tablet 0   No current facility-administered medications on file prior to visit.     Objective:  Objective Physical Exam  Constitutional: She is oriented to person, place, and time. She appears well-developed and well-nourished.  HENT:  Head: Normocephalic and atraumatic.  Eyes: Conjunctivae and EOM are normal.  Neck: Normal range of motion. Neck supple. No JVD present. Carotid bruit is not present. No thyromegaly present.  Cardiovascular: Normal rate, regular rhythm and  normal heart sounds.   No murmur heard. Pulmonary/Chest: Effort normal and breath sounds normal. No respiratory distress. She has no wheezes. She has no rales. She exhibits no tenderness.  Musculoskeletal: She exhibits no edema.  Neurological: She is alert and oriented to person, place, and time.  Psychiatric: Joan [name] mood appears anxious. She exhibits a depressed mood.  Nursing note and vitals reviewed.  BP 138/86 mmHg  Pulse 75  Temp(Src) 98 F (36.7 C) (Oral)  Ht 5' (1.524 m)  Wt 163 lb 12.8 oz (74.299 kg)  BMI 31.99 kg/m2  SpO2 99% Wt Readings from Last 3 Encounters:  10/18/15 163 lb 12.8 oz (74.299 kg)  09/28/15 159 lb 9.6 oz (72.394 kg)  08/29/15 157 lb 9.6 oz (71.487 kg)     Lab Results  Component Value Date   WBC 4.4 04/04/2015   HGB 12.8 04/04/2015   HCT 38.2 04/04/2015   PLT 221.0 04/04/2015   GLUCOSE 91 04/04/2015   CHOL 195 04/04/2015   TRIG 245.0* 04/04/2015   HDL 58.40 04/04/2015   LDLDIRECT  98.0 04/04/2015   LDLCALC 119* 08/31/2014   ALT 10 04/04/2015   AST 15 04/04/2015   NA 140 04/04/2015   K 4.0 04/04/2015   CL 101 04/04/2015   CREATININE 0.80 04/04/2015   BUN 19 04/04/2015   CO2 33* 04/04/2015   TSH 1.41 04/27/2015   INR 0.98 10/26/2010    Dg Chest 2 View  08/29/2015  CLINICAL DATA:  70 year old with 1 week history of mid chest pain and productive cough. Current smoker. EXAM: CHEST  2 VIEW COMPARISON:  12/30/2013, 5/16/1,014. FINDINGS: Cardiac silhouette normal in size, unchanged. Thoracic aorta mildly tortuous and atherosclerotic, unchanged. Hilar and mediastinal contours otherwise unremarkable. Emphysematous changes in the upper lobes and mild hyperinflation with increase in the AP diameter of the chest, unchanged. Lungs clear. Bronchovascular markings normal. Pulmonary vascularity normal. No visible pleural effusions. No pneumothorax. 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 Prior thoracolumbar and lower cervical  fusion. IMPRESSION: COPD/emphysema.  No acute cardiopulmonary disease. Electronically Signed   By: Evangeline Dakin M.D.   On: 08/29/2015 17:11     Assessment & Plan:  Plan I have discontinued Joan [name]'s furosemide. I have also changed Joan [name] clonazePAM. Additionally, I am having Joan [name] start on spironolactone. Lastly, I am having Joan [name] maintain Joan [name] carisoprodol, bisacodyl, Vitamin B-12, Vitamin D3, glucosamine-chondroitin, triamcinolone cream, vitamin C, HYDROcodone-acetaminophen, magnesium gluconate, NON FORMULARY, Fish Oil, Flax Seed Oil, estradiol, potassium chloride SA, hydrochlorothiazide, levothyroxine, omeprazole, dorzolamide-timolol, simvastatin, and Calcium Carbonate-Vit D-Min (CALTRATE PLUS PO).  Meds ordered this encounter  Medications  . Calcium Carbonate-Vit D-Min (CALTRATE PLUS PO)    Sig: Take by mouth.  . clonazePAM (KLONOPIN) 1 MG tablet    Sig: Take 1 tablet (1 mg total) by mouth at bedtime.    Dispense:  90 tablet    Refill:  0  . spironolactone (ALDACTONE) 25 MG tablet    Sig: Take 1 tablet (25 mg total) by mouth daily.    Dispense:  30 tablet    Refill:  2    Problem List Items Addressed This Visit    None    Visit Diagnoses    Grief reaction    -  Primary    Relevant Medications    clonazePAM (KLONOPIN) 1 MG tablet    Bilateral edema of lower extremity        Relevant Medications    spironolactone (ALDACTONE) 25 MG tablet    Need for 23-polyvalent pneumococcal polysaccharide vaccine        Relevant Orders    Pneumococcal polysaccharide vaccine 23-valent greater than or equal to 2yo subcutaneous/IM (Completed)       Follow-up: Return in about 1 day (around 10/19/2015), or if symptoms worsen or fail to improve.  [name], DO

## 2015-10-18 NOTE — Patient Instructions (Signed)
Complicated Grieving Grief is a normal response to the death of someone close to you. Feelings of fear, anger, and guilt can affect almost everyone who loses a loved one. It is also common to have symptoms of depression while you are grieving. These include problems with sleep, loss of appetite, and lack of energy. They may last for weeks or months after a loss. Complicated grief is different from normal grief or depression. Normal grieving involves sadness and feelings of loss, but these feelings are not constant. Complicated grief is a constant and severe type of grief. It interferes with your ability to function normally. It may last for several months to a year or longer. Complicated grief may require treatment from a mental health care provider. CAUSES  It is not known why some people continue to struggle with grief and others do not. You may be at higher risk for complicated grief if:  The death of your loved one was sudden or unexpected.  The death of your loved one was due to a violent event.  Your loved one committed suicide.  Your loved one was a child or a young person.  You were very close to or dependent on the loved one.  You have a history of depression. SIGNS AND SYMPTOMS Signs and symptoms of complicated grief may include:  Feeling disbelief or numbness.  Being unable to enjoy good memories of your loved one.  Needing to avoid anything that reminds you of your loved one.  Being unable to stop thinking about the death.  Feeling intense anger or guilt.  Feeling alone and hopeless.  Feeling that your life is meaningless and empty.  Losing the desire to live. DIAGNOSIS Your health care provider may diagnose complicated grief if:  You have constant symptoms of grief for 6-12 months or longer.  Your symptoms are interfering with your ability to live your life. Your health care provider may want you to see a mental health care provider. Many symptoms of depression  are similar to the symptoms of complicated grief. It is important to be evaluated for complicated grief along with other mental health conditions. TREATMENT  Talk therapy with a mental health provider is the most common treatment for complicated grief. During therapy, you will learn healthy ways to cope with the loss of your loved one. In some cases, your mental health care provider may also recommend antidepressant medicines. HOME CARE INSTRUCTIONS  Take care of yourself.  Eat regular meals and maintain a healthy diet. Eat plenty of fruits, vegetables, and whole grains.  Try to get some exercise each day.  Keep regular hours for sleep. Try to get at least 8 hours of sleep each night.  Do not use drugs or alcohol to ease your symptoms.  Take medicines only as directed by your health care provider.  Spend time with friends and loved ones.  Consider joining a grief (bereavement) support group to help you deal with your loss.  Keep all follow-up visits as directed by your health care provider. This is important. SEEK MEDICAL CARE IF:  Your symptoms keep you from functioning normally.  Your symptoms do not get better with treatment. SEEK IMMEDIATE MEDICAL CARE IF:  You have serious thoughts of hurting yourself or someone else.  You have suicidal feelings.   This information is not intended to replace advice given to you by your health care provider. Make sure you discuss any questions you have with your health care provider.   Document Released: 06/25/2005   Document Revised: 03/16/2015 Document Reviewed: 12/03/2013 Elsevier Interactive Patient Education 2016 Elsevier Inc.  

## 2015-10-18 NOTE — Progress Notes (Signed)
Pre visit review using our clinic review tool, if applicable. No additional management support is needed unless otherwise documented below in the visit note. 

## 2015-10-20 ENCOUNTER — Encounter: Payer: Self-pay | Admitting: Family Medicine

## 2015-10-21 ENCOUNTER — Other Ambulatory Visit: Payer: Self-pay | Admitting: Family Medicine

## 2015-11-02 DIAGNOSIS — H401121 Primary open-angle glaucoma, left eye, mild stage: Secondary | ICD-10-CM | POA: Diagnosis not present

## 2015-11-07 ENCOUNTER — Telehealth: Payer: Self-pay | Admitting: Family Medicine

## 2015-11-07 NOTE — Telephone Encounter (Signed)
We can go back to lasix prn -------

## 2015-11-07 NOTE — Telephone Encounter (Signed)
The Lasix causes issue when she is out int he sun, she wanted to know if she could get something that will not bother her skin when she is in the Sun. She is also taking the HCTZ.   KP

## 2015-11-07 NOTE — Telephone Encounter (Signed)
Can be reached: [phone] Pharmacy: [facility] [place]  Reason for call: Pt states that she took spironolactone for 5 days and blowed up like a toad frog. She said she stopped taking it and is taking the furosemide again. She said an alternative needs sent in.  Pt states that she is feeling better now, after her sister passed. She wanted to let you know she is doing ok.

## 2015-11-07 NOTE — Telephone Encounter (Signed)
Which one do you prefer she stay on, so I can make her aware?    KP

## 2015-11-07 NOTE — Telephone Encounter (Signed)
Last seen 10/18/15. Please advise on alternative to the Spironolactone.     KP

## 2015-11-07 NOTE — Telephone Encounter (Signed)
If she just uses lasix prn does she have same reaction?

## 2015-11-07 NOTE — Telephone Encounter (Signed)
She def should not be taking both-----they all can cause reaction in the sun

## 2015-11-08 NOTE — Telephone Encounter (Signed)
Patient stated if she does not take both of them she will not be able to get in shoes. She says she has been taking both for many years due to the fluid. She said she is not going to change that because she is not going to be walking around like a bull frog.     KP

## 2015-11-14 ENCOUNTER — Ambulatory Visit: Payer: Medicare Other | Admitting: Family Medicine

## 2015-11-23 ENCOUNTER — Other Ambulatory Visit: Payer: Self-pay | Admitting: Family Medicine

## 2015-11-29 ENCOUNTER — Ambulatory Visit (INDEPENDENT_AMBULATORY_CARE_PROVIDER_SITE_OTHER): Payer: Medicare Other | Admitting: Family Medicine

## 2015-11-29 ENCOUNTER — Encounter: Payer: Self-pay | Admitting: Family Medicine

## 2015-11-29 VITALS — BP 110/58 | HR 67 | Temp 98.1°F | Ht 60.0 in | Wt 167.0 lb

## 2015-11-29 DIAGNOSIS — R600 Localized edema: Secondary | ICD-10-CM | POA: Diagnosis not present

## 2015-11-29 DIAGNOSIS — E042 Nontoxic multinodular goiter: Secondary | ICD-10-CM | POA: Diagnosis not present

## 2015-11-29 DIAGNOSIS — E785 Hyperlipidemia, unspecified: Secondary | ICD-10-CM

## 2015-11-29 DIAGNOSIS — E049 Nontoxic goiter, unspecified: Secondary | ICD-10-CM

## 2015-11-29 MED ORDER — VITAMIN C 500 MG PO TABS: 500.0000 mg | ORAL_TABLET | ORAL | Status: DC

## 2015-11-29 NOTE — Patient Instructions (Addendum)
Edema Edema is an abnormal buildup of fluids in your bodytissues. Edema is somewhatdependent on gravity to pull the fluid to the lowest place in your body. That makes the condition more common in the legs and thighs (lower extremities). Painless swelling of the feet and ankles is common and becomes more likely as you get older. It is also common in looser tissues, like around your eyes.  When the affected area is squeezed, the fluid may move out of that spot and leave a dent for a few moments. This dent is called pitting.  CAUSES  There are many possible causes of edema. Eating too much salt and being on your feet or sitting for a long time can cause edema in your legs and ankles. Hot weather may make edema worse. Common medical causes of edema include:  Heart failure.  Liver disease.  Kidney disease.  Weak blood vessels in your legs.  Cancer.  An injury.  Pregnancy.  Some medications.  Obesity. SYMPTOMS  Edema is usually painless.Your skin may look swollen or shiny.  DIAGNOSIS  Your health care provider may be able to diagnose edema by asking about your medical history and doing a physical exam. You may need to have tests such as X-rays, an electrocardiogram, or blood tests to check for medical conditions that may cause edema.  TREATMENT  Edema treatment depends on the cause. If you have heart, liver, or kidney disease, you need the treatment appropriate for these conditions. General treatment may include:  Elevation of the affected body part above the level of your heart.  Compression of the affected body part. Pressure from elastic bandages or support stockings squeezes the tissues and forces fluid back into the blood vessels. This keeps fluid from entering the tissues.  Restriction of fluid and salt intake.  Use of a water pill (diuretic). These medications are appropriate only for some types of edema. They pull fluid out of your body and make you urinate more often. This  gets rid of fluid and reduces swelling, but diuretics can have side effects. Only use diuretics as directed by your health care provider. HOME CARE INSTRUCTIONS   Keep the affected body part above the level of your heart when you are lying down.   Do not sit still or stand for prolonged periods.   Do not put anything directly under your knees when lying down.  Do not wear constricting clothing or garters on your upper legs.   Exercise your legs to work the fluid back into your blood vessels. This may help the swelling go down.   Wear elastic bandages or support stockings to reduce ankle swelling as directed by your health care provider.   Eat a low-salt diet to reduce fluid if your health care provider recommends it.   Only take medicines as directed by your health care provider. SEEK MEDICAL CARE IF:   Your edema is not responding to treatment.  You have heart, liver, or kidney disease and notice symptoms of edema.  You have edema in your legs that does not improve after elevating them.   You have sudden and unexplained weight gain. SEEK IMMEDIATE MEDICAL CARE IF:   You develop shortness of breath or chest pain.   You cannot breathe when you lie down.  You develop pain, redness, or warmth in the swollen areas.   You have heart, liver, or kidney disease and suddenly get edema.  You have a fever and your symptoms suddenly get worse. MAKE SURE YOU:     Understand these instructions.  Will watch your condition.  Will get help right away if you are not doing well or get worse.   This information is not intended to replace advice given to you by your health care provider. Make sure you discuss any questions you have with your health care provider.   Document Released: 06/25/2005 Document Revised: 07/16/2014 Document Reviewed: 04/17/2013 Elsevier Interactive Patient Education 2016 [employer].  Smoking Cessation, Tips for Success If you are ready to quit  smoking, congratulations! You have chosen to help yourself be healthier. Cigarettes bring nicotine, tar, carbon monoxide, and other irritants into your body. Your lungs, heart, and blood vessels will be able to work better without these poisons. There are many different ways to quit smoking. Nicotine gum, nicotine patches, a nicotine inhaler, or nicotine nasal spray can help with physical craving. Hypnosis, support groups, and medicines help break the habit of smoking. WHAT THINGS CAN I DO TO MAKE QUITTING EASIER?  Here are some tips to help you quit for good:  Pick a date when you will quit smoking completely. Tell all of your friends and family about your plan to quit on that date.  Do not try to slowly cut down on the number of cigarettes you are smoking. Pick a quit date and quit smoking completely starting on that day.  Throw away all cigarettes.   Clean and remove all ashtrays from your home, work, and car.  On a card, write down your reasons for quitting. Carry the card with you and read it when you get the urge to smoke.  Cleanse your body of nicotine. Drink enough water and fluids to keep your urine clear or pale yellow. Do this after quitting to flush the nicotine from your body.  Learn to predict your moods. Do not let a bad situation be your excuse to have a cigarette. Some situations in your life might tempt you into wanting a cigarette.  Never have "just one" cigarette. It leads to wanting another and another. Remind yourself of your decision to quit.  Change habits associated with smoking. If you smoked while driving or when feeling stressed, try other activities to replace smoking. Stand up when drinking your coffee. Brush your teeth after eating. Sit in a different chair when you read the paper. Avoid alcohol while trying to quit, and try to drink fewer caffeinated beverages. Alcohol and caffeine may urge you to smoke.  Avoid foods and drinks that can trigger a desire to  smoke, such as sugary or spicy foods and alcohol.  Ask people who smoke not to smoke around you.  Have something planned to do right after eating or having a cup of coffee. For example, plan to take a walk or exercise.  Try a relaxation exercise to calm you down and decrease your stress. Remember, you may be tense and nervous for the first 2 weeks after you quit, but this will pass.  Find new activities to keep your hands busy. Play with a pen, coin, or rubber band. Doodle or draw things on paper.  Brush your teeth right after eating. This will help cut down on the craving for the taste of tobacco after meals. You can also try mouthwash.   Use oral substitutes in place of cigarettes. Try using lemon drops, carrots, cinnamon sticks, or chewing gum. Keep them handy so they are available when you have the urge to smoke.  When you have the urge to smoke, try deep breathing.  Designate  your home as a nonsmoking area.  If you are a heavy smoker, ask your health care provider about a prescription for nicotine chewing gum. It can ease your withdrawal from nicotine.  Reward yourself. Set aside the cigarette money you save and buy yourself something nice.  Look for support from others. Join a support group or smoking cessation program. Ask someone at home or at work to help you with your plan to quit smoking.  Always ask yourself, "Do I need this cigarette or is this just a reflex?" Tell yourself, "Today, I choose not to smoke," or "I do not want to smoke." You are reminding yourself of your decision to quit.  Do not replace cigarette smoking with electronic cigarettes (commonly called e-cigarettes). The safety of e-cigarettes is unknown, and some may contain harmful chemicals.  If you relapse, do not give up! Plan ahead and think about what you will do the next time you get the urge to smoke. HOW WILL I FEEL WHEN I QUIT SMOKING? You may have symptoms of withdrawal because your body is used to  nicotine (the addictive substance in cigarettes). You may crave cigarettes, be irritable, feel very hungry, cough often, get headaches, or have difficulty concentrating. The withdrawal symptoms are only temporary. They are strongest when you first quit but will go away within 10-14 days. When withdrawal symptoms occur, stay in control. Think about your reasons for quitting. Remind yourself that these are signs that your body is healing and getting used to being without cigarettes. Remember that withdrawal symptoms are easier to treat than the major diseases that smoking can cause.  Even after the withdrawal is over, expect periodic urges to smoke. However, these cravings are generally short lived and will go away whether you smoke or not. Do not smoke! WHAT RESOURCES ARE AVAILABLE TO HELP ME QUIT SMOKING? Your health care provider can direct you to community resources or hospitals for support, which may include:  Group support.  Education.  Hypnosis.  Therapy.   This information is not intended to replace advice given to you by your health care provider. Make sure you discuss any questions you have with your health care provider.   Document Released: 03/23/2004 Document Revised: 07/16/2014 Document Reviewed: 12/11/2012 Elsevier Interactive Patient Education [employer].

## 2015-11-30 LAB — COMPREHENSIVE METABOLIC PANEL
ALT: 12 U/L (ref 0–35)
AST: 17 U/L (ref 0–37)
Albumin: 4.3 g/dL (ref 3.5–5.2)
Alkaline Phosphatase: 86 U/L (ref 39–117)
BUN: 16 mg/dL (ref 6–23)
CO2: 33 mEq/L — ABNORMAL HIGH (ref 19–32)
Calcium: 9 mg/dL (ref 8.4–10.5)
Chloride: 99 mEq/L (ref 96–112)
Creatinine, Ser: 0.81 mg/dL (ref 0.40–1.20)
GFR: 74.07 mL/min (ref >60.00)
Glucose, Bld: 115 mg/dL — ABNORMAL HIGH (ref 70–99)
Potassium: 3.6 mEq/L (ref 3.5–5.1)
Sodium: 138 mEq/L (ref 135–145)
Total Bilirubin: 0.3 mg/dL (ref 0.2–1.2)
Total Protein: 6.5 g/dL (ref 6.0–8.3)

## 2015-11-30 LAB — LIPID PANEL
Cholesterol: 250 mg/dL — ABNORMAL HIGH (ref 0–200)
HDL: 55.5 mg/dL (ref >39.00)
Total CHOL/HDL Ratio: 5
Triglycerides: 433 mg/dL — ABNORMAL HIGH (ref 0.0–149.0)

## 2015-11-30 LAB — TSH: TSH: 2.58 u[IU]/mL (ref 0.35–4.50)

## 2015-11-30 LAB — LDL CHOLESTEROL, DIRECT: Direct LDL: 106 mg/dL

## 2015-12-07 ENCOUNTER — Other Ambulatory Visit: Payer: Self-pay | Admitting: Family Medicine

## 2015-12-07 DIAGNOSIS — E785 Hyperlipidemia, unspecified: Secondary | ICD-10-CM

## 2015-12-07 MED ORDER — FENOFIBRATE 160 MG PO TABS: 160.0000 mg | ORAL_TABLET | Freq: Every day | ORAL | Status: DC

## 2015-12-07 NOTE — Progress Notes (Signed)
Patient ID: Joan [name], female    DOB: 08-Feb-1946  Age: 70 y.o. MRN: [id number]    Subjective:  Subjective HPI Joan [name] presents for f/u cholesterol , thyroid and med refill No complaints.     Review of Systems  Constitutional: Negative for diaphoresis, appetite change, fatigue and unexpected weight change.  Eyes: Negative for pain, redness and visual disturbance.  Respiratory: Negative for cough, chest tightness, shortness of breath and wheezing.   Cardiovascular: Negative for chest pain, palpitations and leg swelling.  Endocrine: Negative for cold intolerance, heat intolerance, polydipsia, polyphagia and polyuria.  Genitourinary: Negative for dysuria, frequency and difficulty urinating.  Neurological: Negative for dizziness, light-headedness, numbness and headaches.    History Past Medical History  Diagnosis Date  . Chronic low back pain     followed by Dr Hardin Negus pain mgt  . Hyperlipemia   . Tobacco abuse   . Hypothyroidism   . History of cardiac arrhythmia     cardiologist- Traci Turner  . Esophageal stricture   . Thyroid nodule   . Colon polyp   . PONV (postoperative nausea and vomiting)     Pt reports symptoms are the result of gallbladder and cholecystectomy, not anesthesia  . Heart murmur     "slight; not on RX" (06/24/2013)  . Sleep apnea 1990's    "tested; tried mask; couldn't stand it; told me as long as I slept on my side I'd be ok" (06/24/2013)  . Osteoarthritis of left knee     advanced  . Arthritis     "shoulders; wrist; probably spine" (06/24/2013)  . GERD (gastroesophageal reflux disease)   . Constipation   . Hypertension   . PVC's (premature ventricular contractions)   . Finger pain, left     2 fingers on left hand since wrist surgery  . Hx of colonic polyps 08/28/2004    She has past surgical history that includes Lumbar fusion; Cesarean section (1972); Ankle surgery (Left, 1995); Infusion pump implantation (1990's); Knee arthroscopy  (Left, 1991; ~ 1993); Elbow surgery (1990's); Thyroidectomy (2012); Cervical spine surgery (2012); Foot surgery (Right, 2012); Shoulder arthroscopy (Left, 09/2011); Lumbar laminectomy/decompression microdiscectomy (N/A, 11/28/2012); Back surgery; Cholecystectomy (N/A, 04/16/2013); Laparoscopic lysis of adhesions (N/A, 04/16/2013); Posterior fusion lumbar spine (06/24/2013); Abdominal hysterectomy (1988); ORIF distal radius fracture (Left, 12/30/2013); ORIF wrist fracture (Left, 12/30/2013); Robotic assisted salpingo oophorectomy (Bilateral, 08/20/2014); and Abdominal hysterectomy.   Her family history includes Bone cancer in her sister; Breast cancer in her sister; Cancer in her father, mother, and sister; Coronary artery disease in her mother; Diabetes in her mother and sister; Diabetes type II in her mother; Hypertension in her brother, mother, and sister; Pancreatic cancer in her father. There is no history of Other.She reports that she quit smoking about 2 weeks ago. Her smoking use included Cigarettes. She has a 35 pack-year smoking history. She has never used smokeless tobacco. She reports that she does not drink alcohol or use illicit drugs.  Current Outpatient Prescriptions on File Prior to Visit  Medication Sig Dispense Refill  . HYDROcodone-acetaminophen (NORCO) 10-325 MG per tablet Take 1 tablet by mouth every 4 (four) hours. For pain (Patient taking differently: Take 1 tablet by mouth every 5 (five) hours as needed. For pain) 30 tablet 0  . bisacodyl (DULCOLAX) 5 MG EC tablet Take 5-10 mg by mouth at bedtime as needed for mild constipation (depending on amount of pain medication taken).  30 tablet   . Calcium Carbonate-Vit D-Min (CALTRATE PLUS PO) Take  by mouth.    . carisoprodol (SOMA) 350 MG tablet Take 350 mg by mouth 3 (three) times daily. For muscle spasms    . Cholecalciferol (VITAMIN D3) 5000 UNITS TABS Take 5,000 Units by mouth every other day.     . clonazePAM (KLONOPIN) 1 MG tablet Take 1  tablet (1 mg total) by mouth at bedtime. 90 tablet 0  . Cyanocobalamin (VITAMIN B-12) 2500 MCG SUBL Place 2,500 mcg under the tongue every other day.     . dorzolamide-timolol (COSOPT) 22.3-6.8 MG/ML ophthalmic solution Place 1 drop into the left eye 2 (two) times daily.    Marland Kitchen estradiol (ESTRACE) 0.5 MG tablet TAKE 1 BY MOUTH DAILY 90 tablet 1  . Flaxseed, Linseed, (FLAX SEED OIL) 1000 MG CAPS Take 2,000 mg by mouth every other day.     . furosemide (LASIX) 20 MG tablet Take 1 tablet by mouth daily.    Marland Kitchen glucosamine-chondroitin 500-400 MG tablet Take 2 tablets by mouth every other day.     . hydrochlorothiazide (HYDRODIURIL) 25 MG tablet Take 1 tablet by mouth  daily 90 tablet 1  . levothyroxine (SYNTHROID, LEVOTHROID) 88 MCG tablet Take 1 tablet (88 mcg total) by mouth daily before breakfast. 90 tablet 2  . magnesium gluconate (MAGONATE) 500 MG tablet Take 500 mg by mouth daily.    . NON FORMULARY Super b energy complex-Take one every other day.    . Omega-3 Fatty Acids (FISH OIL) 1000 MG CAPS Take 2,000 mg by mouth every other day.     Marland Kitchen omeprazole (PRILOSEC) 40 MG capsule Take 1 capsule (40 mg total) by mouth daily. 90 capsule 1  . potassium chloride SA (K-DUR,KLOR-CON) 20 MEQ tablet Take 3 tablets (60 mEq total) by mouth daily. 270 tablet 1  . simvastatin (ZOCOR) 20 MG tablet Take 1 tablet (20 mg total) by mouth at bedtime. 90 tablet 1  . triamcinolone cream (KENALOG) 0.1 % Apply 2 application topically daily.      No current facility-administered medications on file prior to visit.     Objective:  Objective Physical Exam  Constitutional: She is oriented to person, place, and time. She appears well-developed and well-nourished.  HENT:  Head: Normocephalic and atraumatic.  Eyes: Conjunctivae and EOM are normal.  Neck: Normal range of motion. Neck supple. No JVD present. Carotid bruit is not present. No thyromegaly present.  Cardiovascular: Normal rate, regular rhythm and normal heart  sounds.   No murmur heard. Pulmonary/Chest: Effort normal and breath sounds normal. No respiratory distress. She has no wheezes. She has no rales. She exhibits no tenderness.  Musculoskeletal: She exhibits no edema.  Neurological: She is alert and oriented to person, place, and time.  Psychiatric: She has a normal mood and affect. Her behavior is normal. Judgment and thought content normal.  Nursing note and vitals reviewed.  BP 110/58 mmHg  Pulse 67  Temp(Src) 98.1 F (36.7 C) (Oral)  Ht 5' (1.524 m)  Wt 167 lb (75.751 kg)  BMI 32.62 kg/m2  SpO2 98% Wt Readings from Last 3 Encounters:  11/29/15 167 lb (75.751 kg)  10/18/15 163 lb 12.8 oz (74.299 kg)  09/28/15 159 lb 9.6 oz (72.394 kg)     Lab Results  Component Value Date   WBC 4.4 04/04/2015   HGB 12.8 04/04/2015   HCT 38.2 04/04/2015   PLT 221.0 04/04/2015   GLUCOSE 115* 11/29/2015   CHOL 250* 11/29/2015   TRIG * 11/29/2015    433.0 Triglyceride is over 400;  calculations on Lipids are invalid.   HDL 55.50 11/29/2015   LDLDIRECT 106.0 11/29/2015   LDLCALC 119* 08/31/2014   ALT 12 11/29/2015   AST 17 11/29/2015   NA 138 11/29/2015   K 3.6 11/29/2015   CL 99 11/29/2015   CREATININE 0.81 11/29/2015   BUN 16 11/29/2015   CO2 33* 11/29/2015   TSH 2.58 11/29/2015   INR 0.98 10/26/2010    Dg Chest 2 View  08/29/2015  CLINICAL DATA:  70 year old with 1 week history of mid chest pain and productive cough. Current smoker. EXAM: CHEST  2 VIEW COMPARISON:  12/30/2013, 5/16/1,014. FINDINGS: Cardiac silhouette normal in size, unchanged. Thoracic aorta mildly tortuous and atherosclerotic, unchanged. Hilar and mediastinal contours otherwise unremarkable. Emphysematous changes in the upper lobes and mild hyperinflation with increase in the AP diameter of the chest, unchanged. Lungs clear. Bronchovascular markings normal. Pulmonary vascularity normal. No visible pleural effusions. No pneumothorax. 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 Prior thoracolumbar and lower cervical fusion. IMPRESSION: COPD/emphysema.  No acute cardiopulmonary disease. Electronically Signed   By: Evangeline Dakin M.D.   On: 08/29/2015 17:11     Assessment & Plan:  Plan I have changed Joan [name]'s vitamin C. I am also having her maintain her carisoprodol, bisacodyl, Vitamin B-12, Vitamin D3, glucosamine-chondroitin, triamcinolone cream, HYDROcodone-acetaminophen, magnesium gluconate, NON FORMULARY, Fish Oil, Flax Seed Oil, estradiol, potassium chloride SA, levothyroxine, omeprazole, dorzolamide-timolol, simvastatin, Calcium Carbonate-Vit D-Min (CALTRATE PLUS PO), clonazePAM, hydrochlorothiazide, and furosemide.  Meds ordered this encounter  Medications  . vitamin C (ASCORBIC ACID) 500 MG tablet    Sig: Take 1 tablet (500 mg total) by mouth every other day.    Dispense:  50 tablet    Refill:  0    Problem List Items Addressed This Visit    None    Visit Diagnoses    Hyperlipidemia LDL goal <100    -  Primary    Relevant Orders    Lipid panel (Completed)    Comprehensive metabolic panel (Completed)    Bilateral edema of lower extremity        Nodular goiter        Relevant Orders    TSH (Completed)       Follow-up: Return in about 6 months (around 05/31/2016), or if symptoms worsen or fail to improve, for hyperlipidemia, annual exam, fasting.  [name], DO

## 2015-12-15 DIAGNOSIS — Z79891 Long term (current) use of opiate analgesic: Secondary | ICD-10-CM | POA: Diagnosis not present

## 2015-12-15 DIAGNOSIS — G894 Chronic pain syndrome: Secondary | ICD-10-CM | POA: Diagnosis not present

## 2015-12-15 DIAGNOSIS — M62830 Muscle spasm of back: Secondary | ICD-10-CM | POA: Diagnosis not present

## 2015-12-15 DIAGNOSIS — M961 Postlaminectomy syndrome, not elsewhere classified: Secondary | ICD-10-CM | POA: Diagnosis not present

## 2015-12-20 DIAGNOSIS — H2513 Age-related nuclear cataract, bilateral: Secondary | ICD-10-CM | POA: Diagnosis not present

## 2015-12-26 DIAGNOSIS — L812 Freckles: Secondary | ICD-10-CM | POA: Insufficient documentation

## 2015-12-26 DIAGNOSIS — L570 Actinic keratosis: Secondary | ICD-10-CM | POA: Diagnosis not present

## 2015-12-26 DIAGNOSIS — D229 Melanocytic nevi, unspecified: Secondary | ICD-10-CM | POA: Insufficient documentation

## 2015-12-26 DIAGNOSIS — D2262 Melanocytic nevi of left upper limb, including shoulder: Secondary | ICD-10-CM | POA: Diagnosis not present

## 2015-12-26 DIAGNOSIS — C44722 Squamous cell carcinoma of skin of right lower limb, including hip: Secondary | ICD-10-CM | POA: Diagnosis not present

## 2015-12-26 DIAGNOSIS — Z85828 Personal history of other malignant neoplasm of skin: Secondary | ICD-10-CM | POA: Diagnosis not present

## 2016-01-02 ENCOUNTER — Other Ambulatory Visit: Payer: Self-pay | Admitting: Family Medicine

## 2016-01-02 NOTE — Telephone Encounter (Signed)
Rx sent to the pharmacy by e-script.//AB/CMA 

## 2016-01-09 ENCOUNTER — Other Ambulatory Visit (INDEPENDENT_AMBULATORY_CARE_PROVIDER_SITE_OTHER): Payer: Medicare Other

## 2016-01-09 DIAGNOSIS — E785 Hyperlipidemia, unspecified: Secondary | ICD-10-CM | POA: Diagnosis not present

## 2016-01-09 LAB — LIPID PANEL
Cholesterol: 171 mg/dL (ref 0–200)
HDL: 61.3 mg/dL (ref >39.00)
LDL Cholesterol: 82 mg/dL (ref 0–99)
NonHDL: 109.6
Total CHOL/HDL Ratio: 3
Triglycerides: 139 mg/dL (ref 0.0–149.0)
VLDL: 27.8 mg/dL (ref 0.0–40.0)

## 2016-01-09 LAB — COMPREHENSIVE METABOLIC PANEL
ALT: 10 U/L (ref 0–35)
AST: 14 U/L (ref 0–37)
Albumin: 4.1 g/dL (ref 3.5–5.2)
Alkaline Phosphatase: 50 U/L (ref 39–117)
BUN: 21 mg/dL (ref 6–23)
CO2: 32 mEq/L (ref 19–32)
Calcium: 8.8 mg/dL (ref 8.4–10.5)
Chloride: 104 mEq/L (ref 96–112)
Creatinine, Ser: 0.99 mg/dL (ref 0.40–1.20)
GFR: 58.74 mL/min — ABNORMAL LOW (ref >60.00)
Glucose, Bld: 95 mg/dL (ref 70–99)
Potassium: 3.6 mEq/L (ref 3.5–5.1)
Sodium: 137 mEq/L (ref 135–145)
Total Bilirubin: 0.4 mg/dL (ref 0.2–1.2)
Total Protein: 6.6 g/dL (ref 6.0–8.3)

## 2016-01-17 ENCOUNTER — Other Ambulatory Visit: Payer: Self-pay | Admitting: Family Medicine

## 2016-01-21 ENCOUNTER — Other Ambulatory Visit: Payer: Self-pay | Admitting: Family Medicine

## 2016-01-23 NOTE — Telephone Encounter (Signed)
Requesting Clonazepam 1mg -Take 1 tablet by mouth once daily at bedtime. Last refill:10/18/15;;#90,0 Last OV:11/29/15 UDS:08/17/13-Low risk-Next screen:02/15/14 Please advise.//AB/CMA

## 2016-01-23 NOTE — Telephone Encounter (Signed)
Rx approved and faxed to the pharmacy.  Confirmation received.//AB/CMA 

## 2016-03-08 ENCOUNTER — Other Ambulatory Visit: Payer: Self-pay | Admitting: Family Medicine

## 2016-03-15 DIAGNOSIS — Z79891 Long term (current) use of opiate analgesic: Secondary | ICD-10-CM | POA: Diagnosis not present

## 2016-03-15 DIAGNOSIS — G894 Chronic pain syndrome: Secondary | ICD-10-CM | POA: Diagnosis not present

## 2016-03-15 DIAGNOSIS — M961 Postlaminectomy syndrome, not elsewhere classified: Secondary | ICD-10-CM | POA: Diagnosis not present

## 2016-03-15 DIAGNOSIS — M62830 Muscle spasm of back: Secondary | ICD-10-CM | POA: Diagnosis not present

## 2016-03-20 ENCOUNTER — Other Ambulatory Visit: Payer: Self-pay | Admitting: Family Medicine

## 2016-03-28 DIAGNOSIS — H401111 Primary open-angle glaucoma, right eye, mild stage: Secondary | ICD-10-CM | POA: Diagnosis not present

## 2016-04-26 ENCOUNTER — Other Ambulatory Visit: Payer: Self-pay | Admitting: Family Medicine

## 2016-04-26 NOTE — Telephone Encounter (Signed)
Last seen 11/29/15 and filled 01/23/16 #90   Please advise    KP

## 2016-04-30 ENCOUNTER — Ambulatory Visit (INDEPENDENT_AMBULATORY_CARE_PROVIDER_SITE_OTHER): Payer: Medicare Other

## 2016-04-30 DIAGNOSIS — Z23 Encounter for immunization: Secondary | ICD-10-CM

## 2016-05-18 DIAGNOSIS — S8981XA Other specified injuries of right lower leg, initial encounter: Secondary | ICD-10-CM | POA: Diagnosis not present

## 2016-05-18 DIAGNOSIS — M25561 Pain in right knee: Secondary | ICD-10-CM | POA: Diagnosis not present

## 2016-05-18 DIAGNOSIS — M1711 Unilateral primary osteoarthritis, right knee: Secondary | ICD-10-CM | POA: Diagnosis not present

## 2016-05-18 DIAGNOSIS — S8001XA Contusion of right knee, initial encounter: Secondary | ICD-10-CM | POA: Diagnosis not present

## 2016-05-21 ENCOUNTER — Other Ambulatory Visit: Payer: Self-pay | Admitting: Family Medicine

## 2016-05-22 ENCOUNTER — Ambulatory Visit (INDEPENDENT_AMBULATORY_CARE_PROVIDER_SITE_OTHER): Payer: Medicare Other | Admitting: Family Medicine

## 2016-05-22 ENCOUNTER — Encounter: Payer: Self-pay | Admitting: Family Medicine

## 2016-05-22 VITALS — BP 127/63 | HR 63 | Temp 97.9°F | Ht 60.0 in | Wt 151.2 lb

## 2016-05-22 DIAGNOSIS — F411 Generalized anxiety disorder: Secondary | ICD-10-CM | POA: Diagnosis not present

## 2016-05-22 DIAGNOSIS — I10 Essential (primary) hypertension: Secondary | ICD-10-CM | POA: Diagnosis not present

## 2016-05-22 DIAGNOSIS — E785 Hyperlipidemia, unspecified: Secondary | ICD-10-CM

## 2016-05-22 DIAGNOSIS — Z0000 Encounter for general adult medical examination without abnormal findings: Secondary | ICD-10-CM

## 2016-05-22 DIAGNOSIS — E039 Hypothyroidism, unspecified: Secondary | ICD-10-CM | POA: Diagnosis not present

## 2016-05-22 LAB — CBC WITH DIFFERENTIAL/PLATELET
Basophils Absolute: 0 10*3/uL (ref 0.0–0.1)
Basophils Relative: 0.6 % (ref 0.0–3.0)
Eosinophils Absolute: 0 10*3/uL (ref 0.0–0.7)
Eosinophils Relative: 1 % (ref 0.0–5.0)
HCT: 40 % (ref 36.0–46.0)
Hemoglobin: 13.4 g/dL (ref 12.0–15.0)
Lymphocytes Relative: 41.9 % (ref 12.0–46.0)
Lymphs Abs: 2.2 10*3/uL (ref 0.7–4.0)
MCHC: 33.6 g/dL (ref 30.0–36.0)
MCV: 91.2 fl (ref 78.0–100.0)
Monocytes Absolute: 0.4 10*3/uL (ref 0.1–1.0)
Monocytes Relative: 8.4 % (ref 3.0–12.0)
Neutro Abs: 2.5 10*3/uL (ref 1.4–7.7)
Neutrophils Relative %: 48.1 % (ref 43.0–77.0)
Platelets: 249 10*3/uL (ref 150.0–400.0)
RBC: 4.39 Mil/uL (ref 3.87–5.11)
RDW: 13.5 % (ref 11.5–15.5)
WBC: 5.2 10*3/uL (ref 4.0–10.5)

## 2016-05-22 LAB — COMPREHENSIVE METABOLIC PANEL
ALT: 10 U/L (ref 0–35)
AST: 16 U/L (ref 0–37)
Albumin: 4.4 g/dL (ref 3.5–5.2)
Alkaline Phosphatase: 42 U/L (ref 39–117)
BUN: 32 mg/dL — ABNORMAL HIGH (ref 6–23)
CO2: 29 mEq/L (ref 19–32)
Calcium: 8.9 mg/dL (ref 8.4–10.5)
Chloride: 104 mEq/L (ref 96–112)
Creatinine, Ser: 1.14 mg/dL (ref 0.40–1.20)
GFR: 49.86 mL/min — ABNORMAL LOW (ref >60.00)
Glucose, Bld: 95 mg/dL (ref 70–99)
Potassium: 3.9 mEq/L (ref 3.5–5.1)
Sodium: 139 mEq/L (ref 135–145)
Total Bilirubin: 0.5 mg/dL (ref 0.2–1.2)
Total Protein: 6.9 g/dL (ref 6.0–8.3)

## 2016-05-22 LAB — POCT URINALYSIS DIPSTICK
Bilirubin, UA: NEGATIVE
Blood, UA: NEGATIVE
Glucose, UA: NEGATIVE
Ketones, UA: NEGATIVE
Leukocytes, UA: NEGATIVE
Nitrite, UA: NEGATIVE
Protein, UA: NEGATIVE
Spec Grav, UA: 1.02
Urobilinogen, UA: 0.2
pH, UA: 7

## 2016-05-22 LAB — TSH: TSH: 1.29 u[IU]/mL (ref 0.35–4.50)

## 2016-05-22 LAB — LIPID PANEL
Cholesterol: 174 mg/dL (ref 0–200)
HDL: 76.9 mg/dL (ref >39.00)
LDL Cholesterol: 78 mg/dL (ref 0–99)
NonHDL: 96.86
Total CHOL/HDL Ratio: 2
Triglycerides: 92 mg/dL (ref 0.0–149.0)
VLDL: 18.4 mg/dL (ref 0.0–40.0)

## 2016-05-22 MED ORDER — CLONAZEPAM 1 MG PO TABS: 1.0000 mg | ORAL_TABLET | Freq: Every day | ORAL | 0 refills | Status: DC

## 2016-05-22 MED ORDER — FENOFIBRATE 160 MG PO TABS: 160.0000 mg | ORAL_TABLET | Freq: Every day | ORAL | 1 refills | Status: DC

## 2016-05-22 MED ORDER — HYDROCHLOROTHIAZIDE 25 MG PO TABS: 25.0000 mg | ORAL_TABLET | Freq: Every day | ORAL | 1 refills | Status: DC

## 2016-05-22 MED ORDER — SIMVASTATIN 20 MG PO TABS: 20.0000 mg | ORAL_TABLET | Freq: Every day | ORAL | 1 refills | Status: DC

## 2016-05-22 NOTE — Progress Notes (Signed)
Pre visit review using our clinic tool,if applicable. No additional management support is needed unless otherwise documented below in the visit note.  

## 2016-05-22 NOTE — Progress Notes (Signed)
Subjective:   Joan [name] is a 70 y.o. female who presents for Medicare Annual (Subsequent) preventive examination.  Review of Systems:   Review of Systems  Constitutional: Negative for activity change, appetite change and fatigue.  HENT: Negative for hearing loss, congestion, tinnitus and ear discharge.   Eyes: Negative for visual disturbance (see optho q1y -- vision corrected to 20/20 with glasses).  Respiratory: Negative for cough, chest tightness and shortness of breath.   Cardiovascular: Negative for chest pain, palpitations and leg swelling.  Gastrointestinal: Negative for abdominal pain, diarrhea, constipation and abdominal distention.  Genitourinary: Negative for urgency, frequency, decreased urine volume and difficulty urinating.  Musculoskeletal: Negative for back pain, arthralgias and gait problem.  Skin: Negative for color change, pallor and rash.  Neurological: Negative for dizziness, light-headedness, numbness and headaches.  Hematological: Negative for adenopathy. Does not bruise/bleed easily.  Psychiatric/Behavioral: Negative for suicidal ideas, confusion, sleep disturbance, self-injury, dysphoric mood, decreased concentration and agitation.  Pt is able to read and write and can do all ADLs No risk for falling No abuse/ violence in home         Objective:     Vitals: BP 127/63   Pulse 63   Temp 97.9 F (36.6 C) (Oral)   Ht 5' (1.524 m)   Wt 151 lb 3.2 oz (68.6 kg)   SpO2 99%   BMI 29.53 kg/m   Body mass index is 29.53 kg/m. BP 127/63   Pulse 63   Temp 97.9 F (36.6 C) (Oral)   Ht 5' (1.524 m)   Wt 151 lb 3.2 oz (68.6 kg)   SpO2 99%   BMI 29.53 kg/m  General appearance: alert, cooperative, appears stated age and no distress Head: Normocephalic, without obvious abnormality, atraumatic Eyes: conjunctivae/corneas clear. PERRL, EOM's intact. Fundi benign. Ears: normal TM's and external ear canals both ears Nose: Nares normal. Septum midline.  Mucosa normal. No drainage or sinus tenderness. Throat: lips, mucosa, and tongue normal; teeth and gums normal Neck: no adenopathy, no carotid bruit, no JVD, supple, symmetrical, trachea midline and thyroid not enlarged, symmetric, no tenderness/mass/nodules Back: symmetric, no curvature. ROM normal. No CVA tenderness. Lungs: clear to auscultation bilaterally Breasts: normal appearance, no masses or tenderness Heart: regular rate and rhythm, S1, S2 normal, no murmur, click, rub or gallop Abdomen: soft, non-tender; bowel sounds normal; no masses,  no organomegaly Pelvic: not indicated; status post hysterectomy, negative ROS Extremities: extremities normal, atraumatic, no cyanosis or edema Pulses: 2+ and symmetric Skin: Skin color, texture, turgor normal. No rashes or lesions Lymph nodes: Cervical, supraclavicular, and axillary nodes normal. Neurologic: Alert and oriented X 3, normal strength and tone. Normal symmetric reflexes. Normal coordination and gait  Tobacco History  Smoking Status  . Former Smoker  . Packs/day: 1.00  . Years: 35.00  . Types: Cigarettes  . Quit date: 11/22/2015  Smokeless Tobacco  . Never Used     Counseling given: No   Past Medical History:  Diagnosis Date  . Arthritis    "shoulders; wrist; probably spine" (06/24/2013)  . Chronic low back pain    followed by Dr Hardin Negus pain mgt  . Colon polyp   . Constipation   . Esophageal stricture   . Finger pain, left    2 fingers on left hand since wrist surgery  . GERD (gastroesophageal reflux disease)   . Heart murmur    "slight; not on RX" (06/24/2013)  . History of cardiac arrhythmia    cardiologist- Traci Turner  .  Hx of colonic polyps 08/28/2004  . Hyperlipemia   . Hypertension   . Hypothyroidism   . Osteoarthritis of left knee    advanced  . PONV (postoperative nausea and vomiting)    Pt reports symptoms are the result of gallbladder and cholecystectomy, not anesthesia  . PVC's (premature  ventricular contractions)   . Sleep apnea 1990's   "tested; tried mask; couldn't stand it; told me as long as I slept on my side I'd be ok" (06/24/2013)  . Thyroid nodule   . Tobacco abuse    Past Surgical History:  Procedure Laterality Date  . ABDOMINAL HYSTERECTOMY  1988   "partial" (06/24/2013)  . ABDOMINAL HYSTERECTOMY     partial in 1988  . ANKLE SURGERY Left 1995   "tendon repair" (06/24/2013)  . BACK SURGERY     "think today was my 8th back OR" (06/24/2013)  . CERVICAL SPINE SURGERY  2012  . [place]  . CHOLECYSTECTOMY N/A 04/16/2013   Procedure: LAPAROSCOPIC CHOLECYSTECTOMY ;  Surgeon: Imogene Burn. Georgette Dover, MD;  Location: [facility];  Service: General;  Laterality: N/A;  . ELBOW SURGERY  1990's  . FOOT SURGERY Right 2012   SPUR REMOVED  . INFUSION PUMP IMPLANTATION  1990's   "implantablet morphine pump; took it out w/in 11 months  . KNEE ARTHROSCOPY Left 1991; ~ 1993  . LAPAROSCOPIC LYSIS OF ADHESIONS N/A 04/16/2013   Procedure: LAPAROSCOPIC LYSIS OF ADHESIONS;  Surgeon: Imogene Burn. Georgette Dover, MD;  Location: [facility];  Service: General;  Laterality: N/A;  . LUMBAR FUSION     and rods  . LUMBAR LAMINECTOMY/DECOMPRESSION MICRODISCECTOMY N/A 11/28/2012   Procedure: DECOMPRESSIVE LUMBAR LAMINECTOMY LEVEL 1;  Surgeon: Elaina Hoops, MD;  Location: [facility];  Service: Neurosurgery;  Laterality: N/A;  DECOMPRESSIVE LUMBAR LAMINECTOMY LEVEL 1  . ORIF DISTAL RADIUS FRACTURE Left 12/30/2013   dr Caralyn Guile  . ORIF WRIST FRACTURE Left 12/30/2013   Procedure: OPEN REDUCTION INTERNAL FIXATION (ORIF) LEFT WRIST FRACTURE AND REPAIR AS INDICATED;  Surgeon: Linna Hoff, MD;  Location: [facility];  Service: Orthopedics;  Laterality: Left;  . POSTERIOR FUSION LUMBAR SPINE  06/24/2013  . ROBOTIC ASSISTED SALPINGO OOPHERECTOMY Bilateral 08/20/2014   Procedure: ROBOTIC ASSISTED SALPINGO OOPHORECTOMY;  Surgeon: Daria Pastures, MD;  Location: [facility];  Service: Gynecology;  Laterality: Bilateral;  .  SHOULDER ARTHROSCOPY Left 09/2011  . THYROIDECTOMY  2012   Family History  Problem Relation Age of Onset  . Pancreatic cancer Father     deceased age 24  . Cancer Father   . Diabetes type II Mother     deceased age 57  . Hypertension Mother   . Coronary artery disease Mother   . Diabetes Mother   . Cancer Mother     Thyroid and Skin  . Diabetes Sister   . Hypertension Sister   . Cancer Sister     Breast  . Bone cancer Sister   . Breast cancer Sister   . Other Neg Hx     No family history of  colon cancer  . Hypertension Brother    History  Sexual Activity  . Sexual activity: Not Currently    Outpatient Encounter Prescriptions as of 05/22/2016  Medication Sig  . diclofenac (VOLTAREN) 75 MG EC tablet Take 75 mg by mouth 2 (two) times daily.  . Turmeric 450 MG CAPS Take 400 mg by mouth daily. 2 tablets daily.  . bisacodyl (DULCOLAX) 5 MG EC tablet Take 5-10 mg  by mouth at bedtime as needed for mild constipation (depending on amount of pain medication taken).   . Calcium Carbonate-Vit D-Min (CALTRATE PLUS PO) Take by mouth.  . carisoprodol (SOMA) 350 MG tablet Take 350 mg by mouth 3 (three) times daily. For muscle spasms  . Cholecalciferol (VITAMIN D3) 5000 UNITS TABS Take 5,000 Units by mouth every other day.   . clonazePAM (KLONOPIN) 1 MG tablet Take 1 tablet (1 mg total) by mouth daily with breakfast.  . Cyanocobalamin (VITAMIN B-12) 2500 MCG SUBL Place 2,500 mcg under the tongue every other day.   . dorzolamide-timolol (COSOPT) 22.3-6.8 MG/ML ophthalmic solution Place 1 drop into the left eye 2 (two) times daily.  Marland Kitchen estradiol (ESTRACE) 0.5 MG tablet TAKE 1 BY MOUTH DAILY  . fenofibrate 160 MG tablet Take 1 tablet (160 mg total) by mouth daily.  . Flaxseed, Linseed, (FLAX SEED OIL) 1000 MG CAPS Take 2,000 mg by mouth every other day.   . hydrochlorothiazide (HYDRODIURIL) 25 MG tablet Take 1 tablet (25 mg total) by mouth daily.  Marland Kitchen HYDROcodone-acetaminophen (NORCO) 10-325 MG  per tablet Take 1 tablet by mouth every 4 (four) hours. For pain (Patient taking differently: Take 1 tablet by mouth every 5 (five) hours as needed. For pain)  . levothyroxine (SYNTHROID, LEVOTHROID) 88 MCG tablet Take 1 tablet by mouth  daily before breakfast  . magnesium gluconate (MAGONATE) 500 MG tablet Take 500 mg by mouth daily.  . NON FORMULARY Super b energy complex-Take one every other day.  . Omega-3 Fatty Acids (FISH OIL) 1000 MG CAPS Take 2,000 mg by mouth every other day.   . potassium chloride SA (K-DUR,KLOR-CON) 20 MEQ tablet Take 3 tablets by mouth  daily  . simvastatin (ZOCOR) 20 MG tablet Take 1 tablet (20 mg total) by mouth at bedtime.  . triamcinolone cream (KENALOG) 0.1 % Apply 2 application topically daily.   . vitamin C (ASCORBIC ACID) 500 MG tablet Take 1 tablet (500 mg total) by mouth every other day.  . [DISCONTINUED] clonazePAM (KLONOPIN) 1 MG tablet TAKE ONE TABLET BY MOUTH ONCE DAILY AT BEDTIME  . [DISCONTINUED] fenofibrate 160 MG tablet TAKE ONE TABLET BY MOUTH ONCE DAILY  . [DISCONTINUED] furosemide (LASIX) 20 MG tablet Take 1 tablet by mouth daily.  . [DISCONTINUED] furosemide (LASIX) 20 MG tablet TAKE ONE TABLET BY MOUTH ONCE DAILY AS NEEDED FOR  FLUID  . [DISCONTINUED] glucosamine-chondroitin 500-400 MG tablet Take 2 tablets by mouth every other day.   . [DISCONTINUED] hydrochlorothiazide (HYDRODIURIL) 25 MG tablet TAKE 1 TABLET BY MOUTH  DAILY  . [DISCONTINUED] omeprazole (PRILOSEC) 40 MG capsule Take 1 capsule (40 mg total) by mouth daily.  . [DISCONTINUED] simvastatin (ZOCOR) 20 MG tablet Take 1 tablet by mouth at  bedtime   No facility-administered encounter medications on file as of 05/22/2016.     Activities of Daily Living In your present state of health, do you have any difficulty performing the following activities: 05/22/2016 09/28/2015  Hearing? N N  Vision? N Y  Difficulty concentrating or making decisions? N N  Walking or climbing stairs? Y N    Dressing or bathing? N N  Doing errands, shopping? N N  Preparing Food and eating ? - N  Using the Toilet? - N  In the past six months, have you accidently leaked urine? - N  Do you have problems with loss of bowel control? - N  Managing your Medications? - N  Managing your Finances? - N  Housekeeping  or managing your Housekeeping? - N  Some recent data might be hidden    Patient Care Team: [name], DO as PCP - General (Family Medicine) Nicholaus Bloom, MD as Consulting Physician (Anesthesiology) Kary Kos, MD as Consulting Physician (Neurosurgery) Iran Planas, MD as Consulting Physician (Orthopedic Surgery) Justice Britain, MD as Consulting Physician (Orthopedic Surgery) Jarome Matin, MD as Consulting Physician (Dermatology) Bobbye Charleston, MD as Consulting Physician (Obstetrics and Gynecology) Barbaraann Cao, OD as Referring Physician (Optometry) Gatha Mayer, MD as Consulting Physician (Gastroenterology)    Assessment:    cpe Exercise Activities and Dietary recommendations Current Exercise Habits: The patient does not participate in regular exercise at present, Exercise limited by: orthopedic condition(s)  Goals    . Increase physical activity          Start back doing the exercises for back.       Fall Risk Fall Risk  05/22/2016 09/28/2015 09/02/2015 09/08/2014 08/17/2013  Falls in the past year? Yes No No Yes No  Number falls in past yr: 1 - - 1 -  Injury with Fall? Yes - - Yes -  Risk Factor Category  High Fall Risk - - - -  Risk for fall due to : Impaired balance/gait Impaired mobility;Impaired balance/gait - - -  Follow up Falls prevention discussed - - - -   Depression Screen PHQ 2/9 Scores 05/22/2016 09/28/2015 09/02/2015 09/08/2014  PHQ - 2 Score 0 3 0 0  PHQ- 9 Score - 8 - -  Exception Documentation Patient refusal - Patient refusal -     Cognitive Function MMSE - Mini Mental State Exam 05/22/2016 09/28/2015  Orientation to time 5 5   Orientation to Place 5 5  Registration 3 3  Attention/ Calculation 5 5  Recall 3 3  Language- name 2 objects 2 2  Language- repeat 1 1  Language- follow 3 step command 3 3  Language- read & follow direction 1 1  Write a sentence 1 1  Copy design 1 1  Total score 30 30        Immunization History  Administered Date(s) Administered  . Influenza Split 03/28/2012  . Influenza Whole 04/28/2007, 05/25/2008, 04/19/2009, 04/18/2010  . Influenza, High Dose Seasonal PF 04/13/2014, 04/04/2015, 04/30/2016  . Influenza,inj,Quad PF,36+ Mos 03/06/2013  . Pneumococcal Conjugate-13 06/07/2014  . Pneumococcal Polysaccharide-23 08/12/2007, 10/18/2015  . Td 10/23/2004, 09/08/2014  . Zoster 03/28/2012   Screening Tests Health Maintenance  Topic Date Due  . MAMMOGRAM  04/21/2017  . COLONOSCOPY  03/27/2020  . TETANUS/TDAP  09/07/2024  . INFLUENZA VACCINE  Completed  . DEXA SCAN  Completed  . ZOSTAVAX  Completed  . Hepatitis C Screening  Completed  . PNA vac Low Risk Adult  Completed      Plan:    see AVS During the course of the visit the patient was educated and counseled about the following appropriate screening and preventive services:   Vaccines to include Pneumoccal, Influenza, Hepatitis B, Td, Zostavax, HCV  Electrocardiogram  Cardiovascular Disease  Colorectal cancer screening  Bone density screening  Diabetes screening  Glaucoma screening  Mammography/PAP  Nutrition counseling   Patient Instructions (the written plan) was given to the patient.  1. Essential hypertension stable - hydrochlorothiazide (HYDRODIURIL) 25 MG tablet; Take 1 tablet (25 mg total) by mouth daily.  Dispense: 90 tablet; Refill: 1 - POCT urinalysis dipstick - CBC with Differential/Platelet - Comprehensive metabolic panel  2. Hyperlipidemia, unspecified hyperlipidemia type Check labs - simvastatin (  ZOCOR) 20 MG tablet; Take 1 tablet (20 mg total) by mouth at bedtime.  Dispense: 90 tablet;  Refill: 1 - fenofibrate 160 MG tablet; Take 1 tablet (160 mg total) by mouth daily.  Dispense: 90 tablet; Refill: 1 - Lipid panel - POCT urinalysis dipstick - CBC with Differential/Platelet - Comprehensive metabolic panel  3. Generalized anxiety disorder  - clonazePAM (KLONOPIN) 1 MG tablet; Take 1 tablet (1 mg total) by mouth daily with breakfast.  Dispense: 90 tablet; Refill: 0  4. Hypothyroidism, unspecified type  - TSH  5. Preventative health care ghm utd Check labs See above  6. Encounter for Medicare annual wellness exam   [name], DO  05/22/2016

## 2016-05-22 NOTE — Patient Instructions (Signed)
Preventive Care 65 Years and Older, Female Preventive care refers to lifestyle choices and visits with your health care provider that can promote health and wellness. What does preventive care include?  A yearly physical exam. This is also called an annual well check.  Dental exams once or twice a year.  Routine eye exams. Ask your health care provider how often you should have your eyes checked.  Personal lifestyle choices, including:  Daily care of your teeth and gums.  Regular physical activity.  Eating a healthy diet.  Avoiding tobacco and drug use.  Limiting alcohol use.  Practicing safe sex.  Taking low-dose aspirin every day.  Taking vitamin and mineral supplements as recommended by your health care provider. What happens during an annual well check? The services and screenings done by your health care provider during your annual well check will depend on your age, overall health, lifestyle risk factors, and family history of disease. Counseling  Your health care provider may ask you questions about your:  Alcohol use.  Tobacco use.  Drug use.  Emotional well-being.  Home and relationship well-being.  Sexual activity.  Eating habits.  History of falls.  Memory and ability to understand (cognition).  Work and work environment.  Reproductive health. Screening  You may have the following tests or measurements:  Height, weight, and BMI.  Blood pressure.  Lipid and cholesterol levels. These may be checked every 5 years, or more frequently if you are over 50 years old.  Skin check.  Lung cancer screening. You may have this screening every year starting at age 55 if you have a 30-pack-year history of smoking and currently smoke or have quit within the past 15 years.  Fecal occult blood test (FOBT) of the stool. You may have this test every year starting at age 50.  Flexible sigmoidoscopy or colonoscopy. You may have a sigmoidoscopy every 5 years or  a colonoscopy every 10 years starting at age 50.  Hepatitis C blood test.  Hepatitis B blood test.  Sexually transmitted disease (STD) testing.  Diabetes screening. This is done by checking your blood sugar (glucose) after you have not eaten for a while (fasting). You may have this done every 1-3 years.  Bone density scan. This is done to screen for osteoporosis. You may have this done starting at age 65.  Mammogram. This may be done every 1-2 years. Talk to your health care provider about how often you should have regular mammograms. Talk with your health care provider about your test results, treatment options, and if necessary, the need for more tests. Vaccines  Your health care provider may recommend certain vaccines, such as:  Influenza vaccine. This is recommended every year.  Tetanus, diphtheria, and acellular pertussis (Tdap, Td) vaccine. You may need a Td booster every 10 years.  Varicella vaccine. You may need this if you have not been vaccinated.  Zoster vaccine. You may need this after age 60.  Measles, mumps, and rubella (MMR) vaccine. You may need at least one dose of MMR if you were born in 1957 or later. You may also need a second dose.  Pneumococcal 13-valent conjugate (PCV13) vaccine. One dose is recommended after age 65.  Pneumococcal polysaccharide (PPSV23) vaccine. One dose is recommended after age 65.  Meningococcal vaccine. You may need this if you have certain conditions.  Hepatitis A vaccine. You may need this if you have certain conditions or if you travel or work in places where you may be exposed to   hepatitis A.  Hepatitis B vaccine. You may need this if you have certain conditions or if you travel or work in places where you may be exposed to hepatitis B.  Haemophilus influenzae type b (Hib) vaccine. You may need this if you have certain conditions. Talk to your health care provider about which screenings and vaccines you need and how often you need  them. This information is not intended to replace advice given to you by your health care provider. Make sure you discuss any questions you have with your health care provider. Document Released: 07/22/2015 Document Revised: 03/14/2016 Document Reviewed: 04/26/2015 Elsevier Interactive Patient Education  2017 Elsevier Inc.  

## 2016-06-05 DIAGNOSIS — S8001XD Contusion of right knee, subsequent encounter: Secondary | ICD-10-CM | POA: Diagnosis not present

## 2016-06-05 DIAGNOSIS — M1711 Unilateral primary osteoarthritis, right knee: Secondary | ICD-10-CM | POA: Diagnosis not present

## 2016-06-05 DIAGNOSIS — M25561 Pain in right knee: Secondary | ICD-10-CM | POA: Diagnosis not present

## 2016-06-11 DIAGNOSIS — M961 Postlaminectomy syndrome, not elsewhere classified: Secondary | ICD-10-CM | POA: Diagnosis not present

## 2016-06-11 DIAGNOSIS — Z79891 Long term (current) use of opiate analgesic: Secondary | ICD-10-CM | POA: Diagnosis not present

## 2016-06-11 DIAGNOSIS — G894 Chronic pain syndrome: Secondary | ICD-10-CM | POA: Diagnosis not present

## 2016-06-11 DIAGNOSIS — M62830 Muscle spasm of back: Secondary | ICD-10-CM | POA: Diagnosis not present

## 2016-06-21 ENCOUNTER — Other Ambulatory Visit: Payer: Self-pay | Admitting: Family Medicine

## 2016-06-21 DIAGNOSIS — E785 Hyperlipidemia, unspecified: Secondary | ICD-10-CM

## 2016-07-05 ENCOUNTER — Other Ambulatory Visit: Payer: Self-pay | Admitting: Family Medicine

## 2016-07-05 MED ORDER — FUROSEMIDE 20 MG PO TABS: ORAL_TABLET | ORAL | 0 refills | Status: DC

## 2016-07-05 NOTE — Telephone Encounter (Addendum)
Relation to [id number] Call back number:[phone] Pharmacy: [facility] [address], [place], [place] [address] [phone]     Reason for call:  Patient requesting a 90 day supply furosemide 20MG   patient is out of town

## 2016-07-05 NOTE — Telephone Encounter (Signed)
Medication has been sent to pharmacy listed.  Pc

## 2016-07-16 ENCOUNTER — Telehealth: Payer: Self-pay | Admitting: Family Medicine

## 2016-07-16 NOTE — Telephone Encounter (Signed)
Called patient back and informed her coding is reviewing the visit and I will update her with their findings. Patient stated she was billed for wellness and she had already had one. She received a bill for 275. Patient was also upset that we billed for additional visit. I asked her did the doctor address any other issues or concerns. She said her medications refills were addressed and I told her that would prompt the other charges.   Will update patient once I hear back from coding

## 2016-07-16 NOTE — Telephone Encounter (Addendum)
Relation to [id number] Call back number:[phone]   Reason for call:  Patient received a bill from [facility] 05/22/16 date of service stating she received 2 physicals within the year, and insurance advised code change and re submit. Chart reflects 09/28/15 medicare wellness appointment with Ashlee. Informed patient office will follow up no later then 07/19/16 Thursday if not sooner, please advise.   Please Note: Transaction inquiry reflects:  PR PPPS, SUBSEQ VISIT [id number] 05/22/16 and 09/28/15

## 2016-07-19 NOTE — Telephone Encounter (Signed)
Charges are being voided patient can disregard the bill. Left a message to call back for update

## 2016-07-24 NOTE — Telephone Encounter (Signed)
Patient returned your call. I informed her of the note below.

## 2016-07-31 ENCOUNTER — Telehealth: Payer: Self-pay | Admitting: Family Medicine

## 2016-07-31 DIAGNOSIS — F411 Generalized anxiety disorder: Secondary | ICD-10-CM

## 2016-07-31 MED ORDER — CLONAZEPAM 1 MG PO TABS: 1.0000 mg | ORAL_TABLET | Freq: Every day | ORAL | 0 refills | Status: DC

## 2016-07-31 NOTE — Telephone Encounter (Signed)
Cut?  What does she mean ?

## 2016-07-31 NOTE — Telephone Encounter (Signed)
Ok to fill 

## 2016-07-31 NOTE — Telephone Encounter (Signed)
PCP did ok refill. Faxed to [facility] in [place] Patient notified.

## 2016-07-31 NOTE — Telephone Encounter (Signed)
Her last appt. 4 prescriptions were on one sheet of paper.  She had to cut 2 off and send to mail order.  She took the clonazepam not on a whole sheet of paper and [facility] would not fill. She will be out tonight. Needs to be faxed to [facility]. [place] Last refill was given 05/22/2016 Office visit 05/22/2016

## 2016-07-31 NOTE — Telephone Encounter (Signed)
Patient called stating that the prescription that was given to her to take to the pharmacy in [place] wasn't accepted because it was cut. The medication is clonazePAM (KLONOPIN) 1 MG tablet She would like to know if this can be straightened out because she will be out very soon. Please advise

## 2016-08-03 ENCOUNTER — Other Ambulatory Visit: Payer: Self-pay | Admitting: Family Medicine

## 2016-08-09 ENCOUNTER — Other Ambulatory Visit: Payer: Self-pay | Admitting: Family Medicine

## 2016-09-10 DIAGNOSIS — M25561 Pain in right knee: Secondary | ICD-10-CM | POA: Diagnosis not present

## 2016-09-10 DIAGNOSIS — S8001XD Contusion of right knee, subsequent encounter: Secondary | ICD-10-CM | POA: Diagnosis not present

## 2016-09-10 DIAGNOSIS — M1711 Unilateral primary osteoarthritis, right knee: Secondary | ICD-10-CM | POA: Diagnosis not present

## 2016-09-11 DIAGNOSIS — H401111 Primary open-angle glaucoma, right eye, mild stage: Secondary | ICD-10-CM | POA: Diagnosis not present

## 2016-09-12 DIAGNOSIS — M961 Postlaminectomy syndrome, not elsewhere classified: Secondary | ICD-10-CM | POA: Diagnosis not present

## 2016-09-12 DIAGNOSIS — M62830 Muscle spasm of back: Secondary | ICD-10-CM | POA: Diagnosis not present

## 2016-09-12 DIAGNOSIS — G894 Chronic pain syndrome: Secondary | ICD-10-CM | POA: Diagnosis not present

## 2016-09-12 DIAGNOSIS — Z79891 Long term (current) use of opiate analgesic: Secondary | ICD-10-CM | POA: Diagnosis not present

## 2016-09-25 ENCOUNTER — Other Ambulatory Visit: Payer: Self-pay | Admitting: Family Medicine

## 2016-10-08 ENCOUNTER — Other Ambulatory Visit: Payer: Self-pay | Admitting: Family Medicine

## 2016-10-08 DIAGNOSIS — I10 Essential (primary) hypertension: Secondary | ICD-10-CM

## 2016-10-22 ENCOUNTER — Other Ambulatory Visit: Payer: Self-pay | Admitting: Obstetrics and Gynecology

## 2016-10-22 DIAGNOSIS — Z1231 Encounter for screening mammogram for malignant neoplasm of breast: Secondary | ICD-10-CM

## 2016-10-30 ENCOUNTER — Other Ambulatory Visit: Payer: Self-pay | Admitting: Family Medicine

## 2016-10-30 ENCOUNTER — Telehealth: Payer: Self-pay | Admitting: Family Medicine

## 2016-10-30 DIAGNOSIS — F411 Generalized anxiety disorder: Secondary | ICD-10-CM

## 2016-10-30 MED ORDER — TRAZODONE HCL 50 MG PO TABS: ORAL_TABLET | ORAL | 0 refills | Status: DC

## 2016-10-30 NOTE — Telephone Encounter (Signed)
Inform pt --- she can not take the klonopin with the vicodin anymore.    Esp with Dr Myles Rosenthal writing that amount.

## 2016-10-30 NOTE — Telephone Encounter (Signed)
Faxed hardcopy for Clonazepam to [facility]

## 2016-10-30 NOTE — Telephone Encounter (Signed)
Patient called to inform we sent the clonazepam to incorrect pharmacy.  So did call the [facility] in [facility] and canceled this prescription. Phoned in to [facility] in [place]. Patient informed refill should be ready shortly for her.

## 2016-10-30 NOTE — Telephone Encounter (Signed)
Can try trazadone 50 mg #30  1/2-1 po qhs prn sleep

## 2016-10-30 NOTE — Addendum Note (Signed)
Addended by: Sharon Seller B on: 10/30/2016 05:54 PM   Modules accepted: Orders

## 2016-10-30 NOTE — Telephone Encounter (Signed)
Relation to [id number] Call back number:[phone] Pharmacy: [facility], [place], [place] [address] [phone]    Reason for call:  Patient requesting clonazePAM Bobbye Charleston) 1 MG tablet please send to [facility] [place], [place], [place] [address] [phone], patient states she relocated and Rx was sent to wrong pharmacy and they will not transfer Rx, please advise

## 2016-10-30 NOTE — Telephone Encounter (Signed)
Pharmacist from [facility] in [facility] where patient is now living and getting prescriptions called to inform Engineer, maintenance [phone]).  Patient received on 06/15/2016  #360 Vicodin 10/325 and then on 09/13/2016 again #360 vicodin prescribed by Dr. Nicholaus Bloom. Also received on 06/15/2017  #270 soma 350 mg and on 09/13/2016  #270 Soma also prescribed by Dr. Nicholaus Bloom. The pharmacist needed clarification from PCP on to fill or not the clonazepam due to danger of taking all together. PCP was verbally informed and did state to not fill the clonazepam.  Pharmacy in [facility] informed to not fill clonazepam and will call the patient to inform as well. I did call to inform this patient PCP will not fill the clonazepam.  She verbalized understanding but did want to know from PCP what can she take to sleep either by prescription or OTC??

## 2016-10-30 NOTE — Telephone Encounter (Signed)
Sent in trazodone to [facility] in [place]. Patient informed sent in.

## 2016-10-30 NOTE — Telephone Encounter (Signed)
Patient was informed PCP would not fill the klonopin with the vicodin any more.  She verbalized understanding, but was not happy with response and asked what to do about sleep. PCP verbally suggested Melatonin Called the patient back to inform of OTC  To take. She stated she had some (melatonin) already and does not work.  Informed the patient she would need to discuss at her next OV with PCP, she did agree.

## 2016-10-30 NOTE — Telephone Encounter (Signed)
Requesting:    clonazepam Contract    none UDS   none Last OV    05/22/2016 Last Refill     #90 no refills on 07/31/2016  Please Advise

## 2016-11-19 ENCOUNTER — Other Ambulatory Visit: Payer: Self-pay | Admitting: Family Medicine

## 2016-11-20 ENCOUNTER — Telehealth: Payer: Self-pay | Admitting: Family Medicine

## 2016-11-20 ENCOUNTER — Ambulatory Visit: Payer: Medicare Other | Admitting: Family Medicine

## 2016-11-20 NOTE — Telephone Encounter (Signed)
Sounds like she just wants labs done the day of her appointment

## 2016-11-20 NOTE — Telephone Encounter (Signed)
Ok

## 2016-11-20 NOTE — Telephone Encounter (Signed)
She is due for f/u and labs--  We can do labs after her appointment ---- I'll put the order in when she is here

## 2016-11-20 NOTE — Telephone Encounter (Signed)
Caller name: Tasmin Relation to pt: self  Call back number: [phone] Pharmacy:  Reason for call: Pt called rescheduling her appt from December 10, 2016 to June 7,2018 as a fu appt, pt wanted to know if she needed lab to be done to please have the orders in for December 13, 2016 since she does not want to make another trip to our office. Please advise.

## 2016-11-29 ENCOUNTER — Other Ambulatory Visit: Payer: Self-pay | Admitting: Family Medicine

## 2016-11-29 NOTE — Telephone Encounter (Signed)
Last refill on 10/30/2016  #30 Last office visit 05/22/16

## 2016-12-10 ENCOUNTER — Ambulatory Visit: Payer: Medicare Other | Admitting: Family Medicine

## 2016-12-10 ENCOUNTER — Telehealth: Payer: Self-pay | Admitting: Family Medicine

## 2016-12-10 DIAGNOSIS — E785 Hyperlipidemia, unspecified: Secondary | ICD-10-CM

## 2016-12-10 MED ORDER — FENOFIBRATE 160 MG PO TABS: 160.0000 mg | ORAL_TABLET | Freq: Every day | ORAL | 1 refills | Status: DC

## 2016-12-10 NOTE — Telephone Encounter (Signed)
Called the patient left a detailed message to call us back to clarify pharmacy before sending in

## 2016-12-10 NOTE — Telephone Encounter (Signed)
Relation to SW:VTVN Call back number:[phone] Pharmacy: [facility], [place], [place] [address] [phone]     Reason for call:  Patient requesting a refill fenofibrate 160 MG tablet, patient would like [facility] removed from her chart due to patient relocating, please advise

## 2016-12-10 NOTE — Telephone Encounter (Signed)
Patient called back and confirmed to send to [facility]

## 2016-12-11 ENCOUNTER — Other Ambulatory Visit: Payer: Self-pay | Admitting: Family Medicine

## 2016-12-11 DIAGNOSIS — L570 Actinic keratosis: Secondary | ICD-10-CM | POA: Diagnosis not present

## 2016-12-11 DIAGNOSIS — L821 Other seborrheic keratosis: Secondary | ICD-10-CM | POA: Diagnosis not present

## 2016-12-11 DIAGNOSIS — Z85828 Personal history of other malignant neoplasm of skin: Secondary | ICD-10-CM | POA: Diagnosis not present

## 2016-12-11 DIAGNOSIS — L578 Other skin changes due to chronic exposure to nonionizing radiation: Secondary | ICD-10-CM | POA: Diagnosis not present

## 2016-12-11 DIAGNOSIS — L820 Inflamed seborrheic keratosis: Secondary | ICD-10-CM | POA: Insufficient documentation

## 2016-12-11 DIAGNOSIS — I10 Essential (primary) hypertension: Secondary | ICD-10-CM

## 2016-12-11 DIAGNOSIS — L858 Other specified epidermal thickening: Secondary | ICD-10-CM | POA: Diagnosis not present

## 2016-12-12 ENCOUNTER — Ambulatory Visit: Payer: Medicare Other

## 2016-12-12 ENCOUNTER — Ambulatory Visit
Admission: RE | Admit: 2016-12-12 | Discharge: 2016-12-12 | Disposition: A | Discharge status: Home / Self Care | Payer: Medicare Other | Source: Ambulatory Visit | Attending: Obstetrics and Gynecology | Admitting: Obstetrics and Gynecology

## 2016-12-12 DIAGNOSIS — M62830 Muscle spasm of back: Secondary | ICD-10-CM | POA: Diagnosis not present

## 2016-12-12 DIAGNOSIS — G894 Chronic pain syndrome: Secondary | ICD-10-CM | POA: Diagnosis not present

## 2016-12-12 DIAGNOSIS — Z1231 Encounter for screening mammogram for malignant neoplasm of breast: Secondary | ICD-10-CM | POA: Diagnosis not present

## 2016-12-12 DIAGNOSIS — M961 Postlaminectomy syndrome, not elsewhere classified: Secondary | ICD-10-CM | POA: Diagnosis not present

## 2016-12-12 DIAGNOSIS — Z79891 Long term (current) use of opiate analgesic: Secondary | ICD-10-CM | POA: Diagnosis not present

## 2016-12-13 ENCOUNTER — Encounter: Payer: Self-pay | Admitting: Family Medicine

## 2016-12-13 ENCOUNTER — Ambulatory Visit (INDEPENDENT_AMBULATORY_CARE_PROVIDER_SITE_OTHER): Payer: Medicare Other | Admitting: Family Medicine

## 2016-12-13 VITALS — BP 116/70 | HR 62 | Temp 97.9°F | Resp 16 | Ht 60.0 in | Wt 156.8 lb

## 2016-12-13 DIAGNOSIS — E039 Hypothyroidism, unspecified: Secondary | ICD-10-CM | POA: Diagnosis not present

## 2016-12-13 DIAGNOSIS — G4700 Insomnia, unspecified: Secondary | ICD-10-CM | POA: Insufficient documentation

## 2016-12-13 DIAGNOSIS — E785 Hyperlipidemia, unspecified: Secondary | ICD-10-CM

## 2016-12-13 LAB — COMPREHENSIVE METABOLIC PANEL
ALT: 16 U/L (ref 0–35)
AST: 20 U/L (ref 0–37)
Albumin: 4.3 g/dL (ref 3.5–5.2)
Alkaline Phosphatase: 43 U/L (ref 39–117)
BUN: 22 mg/dL (ref 6–23)
CO2: 27 mEq/L (ref 19–32)
Calcium: 9.1 mg/dL (ref 8.4–10.5)
Chloride: 105 mEq/L (ref 96–112)
Creatinine, Ser: 0.95 mg/dL (ref 0.40–1.20)
GFR: 61.44 mL/min (ref >60.00)
Glucose, Bld: 101 mg/dL — ABNORMAL HIGH (ref 70–99)
Potassium: 4.6 mEq/L (ref 3.5–5.1)
Sodium: 137 mEq/L (ref 135–145)
Total Bilirubin: 0.4 mg/dL (ref 0.2–1.2)
Total Protein: 6.7 g/dL (ref 6.0–8.3)

## 2016-12-13 LAB — LIPID PANEL
Cholesterol: 157 mg/dL (ref 0–200)
HDL: 77.6 mg/dL (ref >39.00)
LDL Cholesterol: 65 mg/dL (ref 0–99)
NonHDL: 79.71
Total CHOL/HDL Ratio: 2
Triglycerides: 75 mg/dL (ref 0.0–149.0)
VLDL: 15 mg/dL (ref 0.0–40.0)

## 2016-12-13 LAB — TSH: TSH: 4.27 u[IU]/mL (ref 0.35–4.50)

## 2016-12-13 MED ORDER — TRAZODONE HCL 50 MG PO TABS: 50.0000 mg | ORAL_TABLET | Freq: Every day | ORAL | 3 refills | Status: DC

## 2016-12-13 MED ORDER — SIMVASTATIN 20 MG PO TABS: 20.0000 mg | ORAL_TABLET | Freq: Every day | ORAL | 1 refills | Status: DC

## 2016-12-13 MED ORDER — SIMVASTATIN 20 MG PO TABS: 20.0000 mg | ORAL_TABLET | Freq: Every day | ORAL | 0 refills | Status: DC

## 2016-12-13 NOTE — Assessment & Plan Note (Signed)
Check labs  Tolerating statin, encouraged heart healthy diet, avoid trans fats, minimize simple carbs and saturated fats. Increase exercise as tolerated 

## 2016-12-13 NOTE — Progress Notes (Signed)
Patient ID: Joan [name], female   DOB: 11-19-1945, 71 y.o.   MRN: [id number]     Subjective:  I acted as a Education administrator for Dr. Carollee Herter.  Joan [name], Joan [name]   Patient ID: Joan [name], female    DOB: 06-16-1946, 71 y.o.   MRN: [id number]  Chief Complaint  Patient presents with  . Hypertension  . Hyperlipidemia    HPI  Patient is in today for follow up blood pressure and cholesterol.    Patient Care Team: Carollee Herter, Alferd Apa, DO as PCP - General (Family Medicine) Nicholaus Bloom, MD as Consulting Physician (Anesthesiology) Kary Kos, MD as Consulting Physician (Neurosurgery) Iran Planas, MD as Consulting Physician (Orthopedic Surgery) Justice Britain, MD as Consulting Physician (Orthopedic Surgery) Jarome Matin, MD as Consulting Physician (Dermatology) Bobbye Charleston, MD as Consulting Physician (Obstetrics and Gynecology) Barbaraann Cao, OD as Referring Physician (Optometry) Gatha Mayer, MD as Consulting Physician (Gastroenterology)   Past Medical History:  Diagnosis Date  . Arthritis    "shoulders; wrist; probably spine" (06/24/2013)  . Chronic low back pain    followed by Dr Hardin Negus pain mgt  . Colon polyp   . Constipation   . Esophageal stricture   . Finger pain, left    2 fingers on left hand since wrist surgery  . GERD (gastroesophageal reflux disease)   . Heart murmur    "slight; not on RX" (06/24/2013)  . History of cardiac arrhythmia    cardiologist- Traci Turner  . Hx of colonic polyps 08/28/2004  . Hyperlipemia   . Hypertension   . Hypothyroidism   . Osteoarthritis of left knee    advanced  . PONV (postoperative nausea and vomiting)    Pt reports symptoms are the result of gallbladder and cholecystectomy, not anesthesia  . PVC's (premature ventricular contractions)   . Sleep apnea 1990's   "tested; tried mask; couldn't stand it; told me as long as I slept on my side I'd be ok" (06/24/2013)  . Thyroid nodule   . Tobacco abuse     Past  Surgical History:  Procedure Laterality Date  . ABDOMINAL HYSTERECTOMY  1988   "partial" (06/24/2013)  . ABDOMINAL HYSTERECTOMY     partial in 1988  . ANKLE SURGERY Left 1995   "tendon repair" (06/24/2013)  . BACK SURGERY     "think today was my 8th back OR" (06/24/2013)  . CERVICAL SPINE SURGERY  2012  . [place]  . CHOLECYSTECTOMY N/A 04/16/2013   Procedure: LAPAROSCOPIC CHOLECYSTECTOMY ;  Surgeon: Imogene Burn. Georgette Dover, MD;  Location: [facility];  Service: General;  Laterality: N/A;  . ELBOW SURGERY  1990's  . FOOT SURGERY Right 2012   SPUR REMOVED  . INFUSION PUMP IMPLANTATION  1990's   "implantablet morphine pump; took it out w/in 11 months  . KNEE ARTHROSCOPY Left 1991; ~ 1993  . LAPAROSCOPIC LYSIS OF ADHESIONS N/A 04/16/2013   Procedure: LAPAROSCOPIC LYSIS OF ADHESIONS;  Surgeon: Imogene Burn. Georgette Dover, MD;  Location: [facility];  Service: General;  Laterality: N/A;  . LUMBAR FUSION     and rods  . LUMBAR LAMINECTOMY/DECOMPRESSION MICRODISCECTOMY N/A 11/28/2012   Procedure: DECOMPRESSIVE LUMBAR LAMINECTOMY LEVEL 1;  Surgeon: Elaina Hoops, MD;  Location: [facility];  Service: Neurosurgery;  Laterality: N/A;  DECOMPRESSIVE LUMBAR LAMINECTOMY LEVEL 1  . ORIF DISTAL RADIUS FRACTURE Left 12/30/2013   dr Caralyn Guile  . ORIF WRIST FRACTURE Left 12/30/2013   Procedure: OPEN REDUCTION INTERNAL FIXATION (ORIF) LEFT WRIST FRACTURE  AND REPAIR AS INDICATED;  Surgeon: Linna Hoff, MD;  Location: [facility];  Service: Orthopedics;  Laterality: Left;  . POSTERIOR FUSION LUMBAR SPINE  06/24/2013  . ROBOTIC ASSISTED SALPINGO OOPHERECTOMY Bilateral 08/20/2014   Procedure: ROBOTIC ASSISTED SALPINGO OOPHORECTOMY;  Surgeon: Daria Pastures, MD;  Location: [facility];  Service: Gynecology;  Laterality: Bilateral;  . SHOULDER ARTHROSCOPY Left 09/2011  . THYROIDECTOMY  2012    Family History  Problem Relation Age of Onset  . Pancreatic cancer Father        deceased age 81  . Cancer Father   . Diabetes type II  Mother        deceased age 35  . Hypertension Mother   . Coronary artery disease Mother   . Diabetes Mother   . Cancer Mother        Thyroid and Skin  . Diabetes Sister   . Hypertension Sister   . Cancer Sister        Breast  . Bone cancer Sister   . Breast cancer Sister   . Hypertension Brother   . Breast cancer Maternal Aunt   . Other Neg Hx        No family history of  colon cancer    Social History   Social History  . Marital status: Divorced    Spouse name: N/A  . Number of children: N/A  . Years of education: N/A   Occupational History  . Not on file.   Social History Main Topics  . Smoking status: Former Smoker    Packs/day: 1.00    Years: 35.00    Types: Cigarettes    Quit date: 11/22/2015  . Smokeless tobacco: Never Used  . Alcohol use No  . Drug use: No  . Sexual activity: Not Currently   Other Topics Concern  . Not on file   Social History Narrative   Divorced   Current Smoker  1 ppd -  20 yrs      Alcohol use-no       [employer] - laid off       Physician roster:   Dr. Elta Guadeloupe Philips - pain management   Dr. Philis Pique - GYN   Dr. Saintclair Halsted - Neurosurgery   Dr. Ronnald Ramp - dermatology   Dr. Caralyn Guile - orthopedics   Dr. Fransico Him - cardiology   Dr. Miller-ophthalmology    Outpatient Medications Prior to Visit  Medication Sig Dispense Refill  . bisacodyl (DULCOLAX) 5 MG EC tablet Take 5-10 mg by mouth at bedtime as needed for mild constipation (depending on amount of pain medication taken).  30 tablet   . carisoprodol (SOMA) 350 MG tablet Take 350 mg by mouth 2 (two) times daily. For muscle spasms    . Cyanocobalamin (VITAMIN B-12) 2500 MCG SUBL Place 2,500 mcg under the tongue every other day.     . diclofenac (VOLTAREN) 75 MG EC tablet Take 75 mg by mouth 2 (two) times daily.    . dorzolamide-timolol (COSOPT) 22.3-6.8 MG/ML ophthalmic solution Place 1 drop into the left eye 2 (two) times daily.    Marland Kitchen estradiol (ESTRACE) 0.5 MG tablet  TAKE 1 BY MOUTH DAILY 90 tablet 1  . fenofibrate 160 MG tablet Take 1 tablet (160 mg total) by mouth daily. 90 tablet 1  . furosemide (LASIX) 20 MG tablet TAKE 1 TABLET BY MOUTH ONCE DAILY AS NEEDED FOR  FLUID 30 tablet 0  . hydrochlorothiazide (HYDRODIURIL) 25 MG tablet TAKE 1 TABLET  BY MOUTH  DAILY 90 tablet 0  . levothyroxine (SYNTHROID, LEVOTHROID) 88 MCG tablet TAKE 1 TABLET BY MOUTH  DAILY BEFORE BREAKFAST 90 tablet 0  . magnesium gluconate (MAGONATE) 500 MG tablet Take 500 mg by mouth daily.    . potassium chloride SA (K-DUR,KLOR-CON) 20 MEQ tablet TAKE 3 TABLETS BY MOUTH  DAILY 270 tablet 0  . Turmeric 450 MG CAPS Take 400 mg by mouth daily. 2 tablets daily.    . simvastatin (ZOCOR) 20 MG tablet TAKE 1 TABLET BY MOUTH AT  BEDTIME 90 tablet 1  . Calcium Carbonate-Vit D-Min (CALTRATE PLUS PO) Take by mouth.    . Cholecalciferol (VITAMIN D3) 5000 UNITS TABS Take 5,000 Units by mouth every other day.     . clonazePAM (KLONOPIN) 1 MG tablet TAKE ONE TABLET BY MOUTH ONCE DAILY WITH BREAKFAST 90 tablet 0  . Flaxseed, Linseed, (FLAX SEED OIL) 1000 MG CAPS Take 2,000 mg by mouth every other day.     Marland Kitchen HYDROcodone-acetaminophen (NORCO) 10-325 MG per tablet Take 1 tablet by mouth every 4 (four) hours. For pain (Patient taking differently: Take 1 tablet by mouth every 5 (five) hours as needed. For pain) 30 tablet 0  . NON FORMULARY Super b energy complex-Take one every other day.    . Omega-3 Fatty Acids (FISH OIL) 1000 MG CAPS Take 2,000 mg by mouth every other day.     . traZODone (DESYREL) 50 MG tablet TAKE 1/2 TO 1 (ONE-HALF TO ONE) TABLET BY MOUTH ONCE NIGHTLY AS NEEDED FOR SLEEP (Patient taking differently: 1 tablet once nightly as needed for sleep) 30 tablet 0  . triamcinolone cream (KENALOG) 0.1 % Apply 2 application topically daily.     . vitamin C (ASCORBIC ACID) 500 MG tablet Take 1 tablet (500 mg total) by mouth every other day. 50 tablet 0   No facility-administered medications prior to  visit.     Allergies  Allergen Reactions  . Morphine And Related Swelling  . Amitriptyline Other (See Comments)    Leg swelling  . Gabapentin Itching    neurontin  . Triamterene Itching and Rash    Review of Systems  Constitutional: Negative for fever and malaise/fatigue.  HENT: Negative for congestion.   Eyes: Negative for blurred vision.  Respiratory: Negative for cough and shortness of breath.   Cardiovascular: Negative for chest pain, palpitations and leg swelling.  Gastrointestinal: Negative for vomiting.  Musculoskeletal: Negative for back pain.  Skin: Negative for rash.  Neurological: Negative for loss of consciousness and headaches.       Objective:    Physical Exam  Constitutional: She is oriented to person, place, and time. She appears well-developed and well-nourished. No distress.  HENT:  Head: Normocephalic and atraumatic.  Eyes: Conjunctivae are normal.  Neck: Normal range of motion. No thyromegaly present.  Cardiovascular: Normal rate and regular rhythm.   Pulmonary/Chest: Effort normal and breath sounds normal. She has no wheezes.  Abdominal: Soft. Bowel sounds are normal. There is no tenderness.  Musculoskeletal: Normal range of motion. She exhibits no edema or deformity.  Neurological: She is alert and oriented to person, place, and time.  Skin: Skin is warm and dry. She is not diaphoretic.  Psychiatric: She has a normal mood and affect.    BP 116/70 (BP Location: Left Arm, Cuff Size: Normal)   Pulse 62   Temp 97.9 F (36.6 C) (Oral)   Resp 16   Ht 5' (1.524 m)   Wt 156 lb 12.8  oz (71.1 kg)   SpO2 97%   BMI 30.62 kg/m  Wt Readings from Last 3 Encounters:  12/13/16 156 lb 12.8 oz (71.1 kg)  05/22/16 151 lb 3.2 oz (68.6 kg)  11/29/15 167 lb (75.8 kg)   BP Readings from Last 3 Encounters:  12/13/16 116/70  05/22/16 127/63  11/29/15 (!) 110/58     Immunization History  Administered Date(s) Administered  . Influenza Split 03/28/2012  .  Influenza Whole 04/28/2007, 05/25/2008, 04/19/2009, 04/18/2010  . Influenza, High Dose Seasonal PF 04/13/2014, 04/04/2015, 04/30/2016  . Influenza,inj,Quad PF,36+ Mos 03/06/2013  . Pneumococcal Conjugate-13 06/07/2014  . Pneumococcal Polysaccharide-23 08/12/2007, 10/18/2015  . Td 10/23/2004, 09/08/2014  . Zoster 03/28/2012    Health Maintenance  Topic Date Due  . INFLUENZA VACCINE  02/06/2017  . MAMMOGRAM  04/21/2017  . COLONOSCOPY  03/27/2020  . TETANUS/TDAP  09/07/2024  . DEXA SCAN  Completed  . Hepatitis C Screening  Completed  . PNA vac Low Risk Adult  Completed    Lab Results  Component Value Date   WBC 5.2 05/22/2016   HGB 13.4 05/22/2016   HCT 40.0 05/22/2016   PLT 249.0 05/22/2016   GLUCOSE 101 (H) 12/13/2016   CHOL 157 12/13/2016   TRIG 75.0 12/13/2016   HDL 77.60 12/13/2016   LDLDIRECT 106.0 11/29/2015   LDLCALC 65 12/13/2016   ALT 16 12/13/2016   AST 20 12/13/2016   NA 137 12/13/2016   K 4.6 12/13/2016   CL 105 12/13/2016   CREATININE 0.95 12/13/2016   BUN 22 12/13/2016   CO2 27 12/13/2016   TSH 4.27 12/13/2016   INR 0.98 10/26/2010    Lab Results  Component Value Date   TSH 4.27 12/13/2016   Lab Results  Component Value Date   WBC 5.2 05/22/2016   HGB 13.4 05/22/2016   HCT 40.0 05/22/2016   MCV 91.2 05/22/2016   PLT 249.0 05/22/2016   Lab Results  Component Value Date   NA 137 12/13/2016   K 4.6 12/13/2016   CO2 27 12/13/2016   GLUCOSE 101 (H) 12/13/2016   BUN 22 12/13/2016   CREATININE 0.95 12/13/2016   BILITOT 0.4 12/13/2016   ALKPHOS 43 12/13/2016   AST 20 12/13/2016   ALT 16 12/13/2016   PROT 6.7 12/13/2016   ALBUMIN 4.3 12/13/2016   CALCIUM 9.1 12/13/2016   ANIONGAP 6 08/11/2014   GFR 61.44 12/13/2016   Lab Results  Component Value Date   CHOL 157 12/13/2016   Lab Results  Component Value Date   HDL 77.60 12/13/2016   Lab Results  Component Value Date   LDLCALC 65 12/13/2016   Lab Results  Component Value Date    TRIG 75.0 12/13/2016   Lab Results  Component Value Date   CHOLHDL 2 12/13/2016   No results found for: HGBA1C       Assessment & Plan:   Problem List Items Addressed This Visit      Unprioritized   Hypothyroidism   Relevant Orders   TSH (Completed)   Hyperlipidemia    Check labs Tolerating statin, encouraged heart healthy diet, avoid trans fats, minimize simple carbs and saturated fats. Increase exercise as tolerated      Relevant Medications   simvastatin (ZOCOR) 20 MG tablet   Other Relevant Orders   Comprehensive metabolic panel (Completed)   Lipid panel (Completed)   Insomnia - Primary    Pt was on klonopin 1 qhs prn Changed to trazadone--- it is helping pt  Relevant Medications   traZODone (DESYREL) 50 MG tablet      I have discontinued Ms. Zafar's Vitamin D3, triamcinolone cream, NON FORMULARY, Fish Oil, Flax Seed Oil, Calcium Carbonate-Vit D-Min (CALTRATE PLUS PO), vitamin C, clonazePAM, and traZODone. I have also changed her traZODone. Additionally, I am having her maintain her carisoprodol, bisacodyl, Vitamin B-12, magnesium gluconate, estradiol, dorzolamide-timolol, diclofenac, Turmeric, furosemide, fenofibrate, levothyroxine, hydrochlorothiazide, potassium chloride SA, HYDROcodone-acetaminophen, and simvastatin.  Meds ordered this encounter  Medications  . HYDROcodone-acetaminophen (NORCO) 10-325 MG tablet    Sig: Take 1 tablet by mouth every 5 (five) hours as needed.  Marland Kitchen DISCONTD: traZODone (DESYREL) 50 MG tablet    Sig: Take 50 mg by mouth at bedtime.  Marland Kitchen DISCONTD: simvastatin (ZOCOR) 20 MG tablet    Sig: Take 1 tablet (20 mg total) by mouth at bedtime.    Dispense:  10 tablet    Refill:  0  . simvastatin (ZOCOR) 20 MG tablet    Sig: Take 1 tablet (20 mg total) by mouth at bedtime.    Dispense:  90 tablet    Refill:  1  . traZODone (DESYREL) 50 MG tablet    Sig: Take 1 tablet (50 mg total) by mouth at bedtime.    Dispense:  30 tablet     Refill:  3  pt has moved to [place] --- she will be finding a new pcp there  CMA served as scribe during this visit. History, Physical and Plan performed by medical provider. Documentation and orders reviewed and attested to.  [name], DO

## 2016-12-13 NOTE — Assessment & Plan Note (Signed)
Pt was on klonopin 1 qhs prn Changed to trazadone--- it is helping pt

## 2016-12-13 NOTE — Patient Instructions (Addendum)
[facility]  [phone]   Cholesterol Cholesterol is a white, waxy, fat-like substance that is needed by the human body in small amounts. The liver makes all the cholesterol we need. Cholesterol is carried from the liver by the blood through the blood vessels. Deposits of cholesterol (plaques) may build up on blood vessel (artery) walls. Plaques make the arteries narrower and stiffer. Cholesterol plaques increase the risk for heart attack and stroke. You cannot feel your cholesterol level even if it is very high. The only way to know that it is high is to have a blood test. Once you know your cholesterol levels, you should keep a record of the test results. Work with your health care provider to keep your levels in the desired range. What do the results mean?  Total cholesterol is a rough measure of all the cholesterol in your blood.  LDL (low-density lipoprotein) is the "bad" cholesterol. This is the type that causes plaque to build up on the artery walls. You want this level to be low.  HDL (high-density lipoprotein) is the "good" cholesterol because it cleans the arteries and carries the LDL away. You want this level to be high.  Triglycerides are fat that the body can either burn for energy or store. High levels are closely linked to heart disease. What are the desired levels of cholesterol?  Total cholesterol below 200.  LDL below 100 for people who are at risk, below 70 for people at very high risk.  HDL above 40 is good. A level of 60 or higher is considered to be protective against heart disease.  Triglycerides below 150. How can I lower my cholesterol? Diet Follow your diet program as told by your health care provider.  Choose fish or white meat chicken and [place], roasted or baked. Limit fatty cuts of red meat, fried foods, and processed meats, such as sausage and lunch meats.  Eat lots of fresh fruits and vegetables.  Choose whole grains, beans, pasta, potatoes, and  cereals.  Choose olive oil, corn oil, or canola oil, and use only small amounts.  Avoid butter, mayonnaise, shortening, or palm kernel oils.  Avoid foods with trans fats.  Drink skim or nonfat milk and eat low-fat or nonfat yogurt and cheeses. Avoid whole milk, cream, ice cream, egg yolks, and full-fat cheeses.  Healthier desserts include angel food cake, ginger snaps, animal crackers, hard candy, popsicles, and low-fat or nonfat frozen yogurt. Avoid pastries, cakes, pies, and cookies.  Exercise  Follow your exercise program as told by your health care provider. A regular program: ? Helps to decrease LDL and raise HDL. ? Helps with weight control.  Do things that increase your activity level, such as gardening, walking, and taking the stairs.  Ask your health care provider about ways that you can be more active in your daily life.  Medicine  Take over-the-counter and prescription medicines only as told by your health care provider. ? Medicine may be prescribed by your health care provider to help lower cholesterol and decrease the risk for heart disease. This is usually done if diet and exercise have failed to bring down cholesterol levels. ? If you have several risk factors, you may need medicine even if your levels are normal.  This information is not intended to replace advice given to you by your health care provider. Make sure you discuss any questions you have with your health care provider. Document Released: 03/20/2001 Document Revised: 01/21/2016 Document Reviewed: 12/24/2015 Elsevier Interactive Patient Education  2017 Elsevier Inc.  

## 2017-01-01 DIAGNOSIS — Z01419 Encounter for gynecological examination (general) (routine) without abnormal findings: Secondary | ICD-10-CM | POA: Diagnosis not present

## 2017-01-03 ENCOUNTER — Telehealth: Payer: Self-pay | Admitting: Cardiology

## 2017-01-03 NOTE — Telephone Encounter (Signed)
New message     Received referral for Pt to see Dr Radford Pax because Pt is having Chest Pain,jaw pain and sweats.  Offered pt a appt with APP, patient does not want to wait for next available appt. (Mid July with Turner's care team) checked other care teams for earlier appt , nothing open this week.

## 2017-01-03 NOTE — Telephone Encounter (Signed)
Returned call to patient would like to be seen before mid July or September.  Offered first available with APP at [facility] (no openings at [facility]).  Patient agreed, appt made for 7/5 at 1:30 with Almyra Deforest.   Patient aware of location and time, verbalized understanding.  Patient also verbalized understanding that if she has another episode of chest pain, jaw pain and diaphoresis to go to ER to be evaluated.

## 2017-01-07 ENCOUNTER — Telehealth: Payer: Self-pay | Admitting: Physician Assistant

## 2017-01-07 NOTE — Telephone Encounter (Signed)
Received records from [facility] for appointment on 01/10/17 with Almyra Deforest, PA.  Records put with Hao's schedule for 01/10/17. lp

## 2017-01-08 ENCOUNTER — Encounter: Payer: Self-pay | Admitting: *Deleted

## 2017-01-09 NOTE — Progress Notes (Signed)
Cardiology Office Note    Date:  01/10/2017   ID:  Adeli, Frost [birth year], MRN [id number]  PCP:  [name], DO  Cardiologist:  Dr. Radford Pax  Chief Complaint  Patient presents with  . Follow-up    seen for Dr. Radford Pax, chest pain    History of Present Illness:  Joan [name] is a 71 y.o. female with past medical history of GERD, heart murmur, hypertension, hyperlipidemia, hypothyroidism, esophageal stricture and history of tobacco abuse. She was previously seen by Dr. Radford Pax for preoperative clearance on 08/13/2014 prior to her robotic-assisted salpingo-oophorectomy. Her EKG was abnormal. She has chronic lower shunt edema. Lexiscan Myoview obtained on 08/16/2014 showed EF 60%, low risk study with no sign of significant ischemia. More recently, she was referred to cardiology service for evaluation of chest pain and jaw pain  She has chronic back pain, 2 weeks ago, she was at rest when she started having dull left chest radiating to the left arm. It lasted several minutes before going away by itself. A week later, she had another mild episode of chest pain and also significant jaw pain as well. She has very bad back and also bad knee as well, this limit her functional ability. She does live on the second floor and has not noticed any recent exertional chest discomfort. She has chronic lower extremity edema, however no orthopnea or PND. I recommended a repeat Lexiscan Myoview, if negative, would not recommend additional workup. Although she says she has history of murmur on physical exam, I have reviewed her previous echocardiogram in 2008, it does not show significant valvular issue. On physical exam, she does not have significant murmur either. Therefore I did not recommend an echocardiogram.   Past Medical History:  Diagnosis Date  . Arthritis    "shoulders; wrist; probably spine" (06/24/2013)  . Chronic low back pain    followed by Dr Hardin Negus pain mgt  . Colon polyp     . Constipation   . Esophageal stricture   . Finger pain, left    2 fingers on left hand since wrist surgery  . GERD (gastroesophageal reflux disease)   . Heart murmur    "slight; not on RX" (06/24/2013)  . History of cardiac arrhythmia    cardiologist- Traci Turner  . Hx of colonic polyps 08/28/2004  . Hyperlipemia   . Hypertension   . Hypothyroidism   . Osteoarthritis of left knee    advanced  . PONV (postoperative nausea and vomiting)    Pt reports symptoms are the result of gallbladder and cholecystectomy, not anesthesia  . PVC's (premature ventricular contractions)   . Sleep apnea 1990's   "tested; tried mask; couldn't stand it; told me as long as I slept on my side I'd be ok" (06/24/2013)  . Thyroid nodule   . Tobacco abuse     Past Surgical History:  Procedure Laterality Date  . ABDOMINAL HYSTERECTOMY  1988   "partial" (06/24/2013)  . ABDOMINAL HYSTERECTOMY     partial in 1988  . ANKLE SURGERY Left 1995   "tendon repair" (06/24/2013)  . BACK SURGERY     "think today was my 8th back OR" (06/24/2013)  . CERVICAL SPINE SURGERY  2012  . [place]  . CHOLECYSTECTOMY N/A 04/16/2013   Procedure: LAPAROSCOPIC CHOLECYSTECTOMY ;  Surgeon: Imogene Burn. Georgette Dover, MD;  Location: [facility];  Service: General;  Laterality: N/A;  . ELBOW SURGERY  1990's  . FOOT SURGERY Right 2012  SPUR REMOVED  . INFUSION PUMP IMPLANTATION  1990's   "implantablet morphine pump; took it out w/in 11 months  . KNEE ARTHROSCOPY Left 1991; ~ 1993  . LAPAROSCOPIC LYSIS OF ADHESIONS N/A 04/16/2013   Procedure: LAPAROSCOPIC LYSIS OF ADHESIONS;  Surgeon: Imogene Burn. Georgette Dover, MD;  Location: [facility];  Service: General;  Laterality: N/A;  . LUMBAR FUSION     and rods  . LUMBAR LAMINECTOMY/DECOMPRESSION MICRODISCECTOMY N/A 11/28/2012   Procedure: DECOMPRESSIVE LUMBAR LAMINECTOMY LEVEL 1;  Surgeon: Elaina Hoops, MD;  Location: [facility];  Service: Neurosurgery;  Laterality: N/A;  DECOMPRESSIVE LUMBAR  LAMINECTOMY LEVEL 1  . ORIF DISTAL RADIUS FRACTURE Left 12/30/2013   dr Caralyn Guile  . ORIF WRIST FRACTURE Left 12/30/2013   Procedure: OPEN REDUCTION INTERNAL FIXATION (ORIF) LEFT WRIST FRACTURE AND REPAIR AS INDICATED;  Surgeon: Linna Hoff, MD;  Location: [facility];  Service: Orthopedics;  Laterality: Left;  . POSTERIOR FUSION LUMBAR SPINE  06/24/2013  . ROBOTIC ASSISTED SALPINGO OOPHERECTOMY Bilateral 08/20/2014   Procedure: ROBOTIC ASSISTED SALPINGO OOPHORECTOMY;  Surgeon: Daria Pastures, MD;  Location: [facility];  Service: Gynecology;  Laterality: Bilateral;  . SHOULDER ARTHROSCOPY Left 09/2011  . THYROIDECTOMY  2012    Current Medications: Outpatient Medications Prior to Visit  Medication Sig Dispense Refill  . bisacodyl (DULCOLAX) 5 MG EC tablet Take 5-10 mg by mouth at bedtime as needed for mild constipation (depending on amount of pain medication taken).  30 tablet   . carisoprodol (SOMA) 350 MG tablet Take 350 mg by mouth 2 (two) times daily. For muscle spasms    . diclofenac (VOLTAREN) 75 MG EC tablet Take 75 mg by mouth 2 (two) times daily.    . dorzolamide-timolol (COSOPT) 22.3-6.8 MG/ML ophthalmic solution Place 1 drop into the left eye 2 (two) times daily.    Marland Kitchen estradiol (ESTRACE) 0.5 MG tablet TAKE 1 BY MOUTH DAILY 90 tablet 1  . fenofibrate 160 MG tablet Take 1 tablet (160 mg total) by mouth daily. 90 tablet 1  . furosemide (LASIX) 20 MG tablet TAKE 1 TABLET BY MOUTH ONCE DAILY AS NEEDED FOR  FLUID 30 tablet 0  . hydrochlorothiazide (HYDRODIURIL) 25 MG tablet TAKE 1 TABLET BY MOUTH  DAILY 90 tablet 0  . HYDROcodone-acetaminophen (NORCO) 10-325 MG tablet Take 1 tablet by mouth 3 (three) times daily as needed for moderate pain.     Marland Kitchen levothyroxine (SYNTHROID, LEVOTHROID) 88 MCG tablet TAKE 1 TABLET BY MOUTH  DAILY BEFORE BREAKFAST 90 tablet 0  . magnesium gluconate (MAGONATE) 500 MG tablet Take 500 mg by mouth daily.    . potassium chloride SA (K-DUR,KLOR-CON) 20 MEQ tablet TAKE 3  TABLETS BY MOUTH  DAILY 270 tablet 0  . simvastatin (ZOCOR) 20 MG tablet Take 1 tablet (20 mg total) by mouth at bedtime. 90 tablet 1  . traZODone (DESYREL) 50 MG tablet Take 1 tablet (50 mg total) by mouth at bedtime. 30 tablet 3  . Turmeric 450 MG CAPS Take 400 mg by mouth daily.     . Cyanocobalamin (VITAMIN B-12) 2500 MCG SUBL Place 2,500 mcg under the tongue every other day.      No facility-administered medications prior to visit.      Allergies:   Morphine and related; Amitriptyline; Gabapentin; and Triamterene   Social History   Social History  . Marital status: Divorced    Spouse name: N/A  . Number of children: N/A  . Years of education: N/A   Social History  Main Topics  . Smoking status: Current Every Day Smoker    Packs/day: 1.00    Years: 35.00    Types: Cigarettes    Last attempt to quit: 11/22/2015  . Smokeless tobacco: Never Used  . Alcohol use No  . Drug use: No  . Sexual activity: Not Currently   Other Topics Concern  . None   Social History Narrative   Divorced   Current Smoker  1 ppd -  20 yrs      Alcohol use-no       [employer]er[employer] - laid off       Physician roster:   Dr. Elta Guadeloupe Philips - pain management   Dr. Philis Pique - GYN   Dr. Saintclair Halsted - Neurosurgery   Dr. Ronnald Ramp - dermatology   Dr. Caralyn Guile - orthopedics   Dr. Fransico Him - cardiology   Dr. Miller-ophthalmology     Family History:  The patient's family history includes Bone cancer in her sister; Breast cancer in her maternal aunt and sister; Cancer in her father; Coronary artery disease (age of onset: 35) in her mother; Diabetes in her mother and sister; Diabetes type II in her mother; Hypertension in her brother, mother, and sister; Pancreatic cancer in her father; Skin cancer in her mother; Thyroid cancer in her mother.   ROS:   Please see the history of present illness.    ROS All other systems reviewed and are negative.   PHYSICAL EXAM:   VS:  BP 124/80   Pulse 63   Ht 5'  1" (1.549 m)   Wt 157 lb 6.4 oz (71.4 kg)   BMI 29.74 kg/m    GEN: Well nourished, well developed, in no acute distress  HEENT: normal  Neck: no JVD, carotid bruits, or masses Cardiac: RRR; no murmurs, rubs, or gallops,no edema  Respiratory:  clear to auscultation bilaterally, normal work of breathing GI: soft, nontender, nondistended, + BS MS: no deformity or atrophy  Skin: warm and dry, no rash Neuro:  Alert and Oriented x 3, Strength and sensation are intact Psych: euthymic mood, full affect  Wt Readings from Last 3 Encounters:  01/10/17 157 lb 6.4 oz (71.4 kg)  12/13/16 156 lb 12.8 oz (71.1 kg)  05/22/16 151 lb 3.2 oz (68.6 kg)      Studies/Labs Reviewed:   EKG:  EKG is ordered today.  The ekg ordered today demonstrates Normal sinus rhythm, no obvious ischemic changes, compared to the previous EKG, R wave in anterior lead is smaller than 2016 EKG however do not qualify for poor R wave progression.  Recent Labs: 05/22/2016: Hemoglobin 13.4; Platelets 249.0 12/13/2016: ALT 16; BUN 22; Creatinine, Ser 0.95; Potassium 4.6; Sodium 137; TSH 4.27   Lipid Panel    Component Value Date/Time   CHOL 157 12/13/2016 1228   TRIG 75.0 12/13/2016 1228   HDL 77.60 12/13/2016 1228   CHOLHDL 2 12/13/2016 1228   VLDL 15.0 12/13/2016 1228   LDLCALC 65 12/13/2016 1228   LDLDIRECT 106.0 11/29/2015 1515    Additional studies/ records that were reviewed today include:   Myoview 08/16/2014 Impression Exercise Capacity:  Lexiscan with no exercise. BP Response:  Normal blood pressure response. Clinical Symptoms:  Typical symptoms with Lexiscan ECG Impression:  No significant ST segment change suggestive of ischemia. Comparison with Prior Nuclear Study: No images to compare  Overall Impression:  Low risk stress nuclear study With no areas of significant ischemia identified. Study is limited/sensitivity reduced secondary to arm position at  patient's side during study..  LV Ejection  Fraction: 60%.  LV Wall Motion:  NL LV Function; NL Wall Motion   ASSESSMENT:    1. Chest pain at rest   2. Essential hypertension   3. Hyperlipidemia, unspecified hyperlipidemia type   4. Hypothyroidism, unspecified type   5. Tobacco abuse      PLAN:  In order of problems listed above:  1. Chest pain at rest: So far she has had 2 episodes one week apart. Also had arm pain and jaw pain. However, does not have symptoms to have significant correlation with exertion. Granted the patient has very poor functional ability due to chronic back pain and knee pain. We'll plan for repeat Lexiscan Myoview given her cardiovascular risk factors including age, hypertension, hyperlipidemia and history of tobacco abuse.  2. Hypertension: Blood pressure stable.  3. Hyperlipidemia: Continue on Zocor 20 mg daily. The last lipid panel obtained on 12/13/2016 showed well-controlled total cholesterol, triglycerides, HDL and LDL  4. Hypothyroidism: On Synthroid  5. Tobacco abuse: She mentioned she did quit for 6 weeks last year, however has since restarted on smoking. She understand there is a correlation between coronary artery disease and continued tobacco abuse, we emphasized on the importance of tobacco cessation.    Medication Adjustments/Labs and Tests Ordered: Current medicines are reviewed at length with the patient today.  Concerns regarding medicines are outlined above.  Medication changes, Labs and Tests ordered today are listed in the Patient Instructions below. Patient Instructions  Schedule at [facility] has requested that you have en exercise stress myoview. For further information please visit [email or web address]. Please follow instruction sheet, as given.   NO CHANGE WITH CURRENT MEDICATIONS    IF ABOVE TEST IS NORMAL -- FOLLOW UP ON AN AS NEEDED BASIS, IF TEST IS ABNORMAL - APPOINTMENT WILL BE MADE TO [address]    Hilbert Corrigan, PA  01/10/2017 2:39  PM    [facility] [address], [place], [place]  [address] Phone: [phone]; Fax: [phone]

## 2017-01-10 ENCOUNTER — Encounter: Payer: Self-pay | Admitting: Physician Assistant

## 2017-01-10 ENCOUNTER — Ambulatory Visit (INDEPENDENT_AMBULATORY_CARE_PROVIDER_SITE_OTHER): Payer: Medicare Other | Admitting: Physician Assistant

## 2017-01-10 VITALS — BP 124/80 | HR 63 | Ht 61.0 in | Wt 157.4 lb

## 2017-01-10 DIAGNOSIS — E785 Hyperlipidemia, unspecified: Secondary | ICD-10-CM | POA: Diagnosis not present

## 2017-01-10 DIAGNOSIS — E039 Hypothyroidism, unspecified: Secondary | ICD-10-CM | POA: Diagnosis not present

## 2017-01-10 DIAGNOSIS — Z720 Tobacco use: Secondary | ICD-10-CM

## 2017-01-10 DIAGNOSIS — I10 Essential (primary) hypertension: Secondary | ICD-10-CM | POA: Diagnosis not present

## 2017-01-10 DIAGNOSIS — R079 Chest pain, unspecified: Secondary | ICD-10-CM | POA: Diagnosis not present

## 2017-01-10 NOTE — Patient Instructions (Signed)
Schedule at [facility] has requested that you have en exercise stress myoview. For further information please visit [email or web address]. Please follow instruction sheet, as given.   NO CHANGE WITH CURRENT MEDICATIONS    IF ABOVE TEST IS NORMAL -- FOLLOW UP ON AN AS NEEDED BASIS, IF TEST IS ABNORMAL - APPOINTMENT WILL BE MADE TO DISCUSS

## 2017-01-22 ENCOUNTER — Other Ambulatory Visit: Payer: Self-pay | Admitting: Family Medicine

## 2017-01-22 MED ORDER — FUROSEMIDE 20 MG PO TABS: ORAL_TABLET | ORAL | 0 refills | Status: DC

## 2017-01-22 NOTE — Telephone Encounter (Signed)
Refill done as requested. Notified patient of refill (left a message)

## 2017-01-22 NOTE — Telephone Encounter (Signed)
Self.  Refill request for furosemide   Pharmacy:  [facility] [address], [place]

## 2017-01-23 ENCOUNTER — Telehealth (HOSPITAL_COMMUNITY): Payer: Self-pay

## 2017-01-23 DIAGNOSIS — H401111 Primary open-angle glaucoma, right eye, mild stage: Secondary | ICD-10-CM | POA: Diagnosis not present

## 2017-01-23 DIAGNOSIS — H5203 Hypermetropia, bilateral: Secondary | ICD-10-CM | POA: Diagnosis not present

## 2017-01-23 DIAGNOSIS — H25813 Combined forms of age-related cataract, bilateral: Secondary | ICD-10-CM | POA: Diagnosis not present

## 2017-01-23 DIAGNOSIS — H401121 Primary open-angle glaucoma, left eye, mild stage: Secondary | ICD-10-CM | POA: Diagnosis not present

## 2017-01-23 NOTE — Telephone Encounter (Signed)
Encounter complete. 

## 2017-01-24 ENCOUNTER — Ambulatory Visit (HOSPITAL_COMMUNITY)
Admission: RE | Admit: 2017-01-24 | Discharge: 2017-01-24 | Disposition: A | Discharge status: Home / Self Care | Payer: Medicare Other | Source: Ambulatory Visit | Attending: Cardiology | Admitting: Cardiology

## 2017-01-24 DIAGNOSIS — Z6829 Body mass index (BMI) 29.0-29.9, adult: Secondary | ICD-10-CM | POA: Insufficient documentation

## 2017-01-24 DIAGNOSIS — I10 Essential (primary) hypertension: Secondary | ICD-10-CM | POA: Insufficient documentation

## 2017-01-24 DIAGNOSIS — Z8249 Family history of ischemic heart disease and other diseases of the circulatory system: Secondary | ICD-10-CM | POA: Insufficient documentation

## 2017-01-24 DIAGNOSIS — R5383 Other fatigue: Secondary | ICD-10-CM | POA: Diagnosis not present

## 2017-01-24 DIAGNOSIS — R079 Chest pain, unspecified: Secondary | ICD-10-CM | POA: Insufficient documentation

## 2017-01-24 DIAGNOSIS — F17200 Nicotine dependence, unspecified, uncomplicated: Secondary | ICD-10-CM | POA: Insufficient documentation

## 2017-01-24 DIAGNOSIS — E669 Obesity, unspecified: Secondary | ICD-10-CM | POA: Diagnosis not present

## 2017-01-24 DIAGNOSIS — M4800 Spinal stenosis, site unspecified: Secondary | ICD-10-CM | POA: Diagnosis not present

## 2017-01-24 DIAGNOSIS — E079 Disorder of thyroid, unspecified: Secondary | ICD-10-CM | POA: Diagnosis not present

## 2017-01-24 LAB — MYOCARDIAL PERFUSION IMAGING
LV dias vol: 83 mL (ref 46–106)
LV sys vol: 30 mL
Peak HR: 96 {beats}/min
Rest HR: 55 {beats}/min
SDS: 2
SRS: 0
SSS: 2
TID: 1.13

## 2017-01-24 MED ORDER — TECHNETIUM TC 99M TETROFOSMIN IV KIT
29.1000 | PACK | Freq: Once | INTRAVENOUS | Status: AC | PRN
  Administered 2017-01-24: 29.1 via INTRAVENOUS
  Filled 2017-01-24: qty 30

## 2017-01-24 MED ORDER — TECHNETIUM TC 99M TETROFOSMIN IV KIT
9.8000 | PACK | Freq: Once | INTRAVENOUS | Status: AC | PRN
  Administered 2017-01-24: 9.8 via INTRAVENOUS
  Filled 2017-01-24: qty 10

## 2017-01-24 MED ORDER — REGADENOSON 0.4 MG/5ML IV SOLN
0.4000 mg | Freq: Once | INTRAVENOUS | Status: AC
  Administered 2017-01-24: 0.4 mg via INTRAVENOUS

## 2017-01-24 MED ORDER — AMINOPHYLLINE 25 MG/ML IV SOLN
75.0000 mg | Freq: Once | INTRAVENOUS | Status: AC
  Administered 2017-01-24: 75 mg via INTRAVENOUS

## 2017-02-04 DIAGNOSIS — H25811 Combined forms of age-related cataract, right eye: Secondary | ICD-10-CM | POA: Diagnosis not present

## 2017-02-04 DIAGNOSIS — H40053 Ocular hypertension, bilateral: Secondary | ICD-10-CM | POA: Diagnosis not present

## 2017-02-04 DIAGNOSIS — H02831 Dermatochalasis of right upper eyelid: Secondary | ICD-10-CM | POA: Diagnosis not present

## 2017-02-04 DIAGNOSIS — H25812 Combined forms of age-related cataract, left eye: Secondary | ICD-10-CM | POA: Diagnosis not present

## 2017-02-13 ENCOUNTER — Telehealth: Payer: Self-pay

## 2017-02-13 NOTE — Telephone Encounter (Signed)
Clearance request received from [facility] for patient to have cataract surgery under local anesthesia with IV sedation on 02/20/17.  Per request, "We would appreciate any pertinent medical information relating to her June chest pain.  Please fax this form and any additional information to our confidential fax line at [phone]."

## 2017-02-13 NOTE — Telephone Encounter (Signed)
She is cleared for cataract surgery, if needed, can forward my office note in July and also her recent stress test report.

## 2017-02-14 NOTE — Telephone Encounter (Signed)
Faxed this note, along with stress test and office note, electronically to number provided.

## 2017-02-20 DIAGNOSIS — H2512 Age-related nuclear cataract, left eye: Secondary | ICD-10-CM | POA: Diagnosis not present

## 2017-02-20 DIAGNOSIS — H40052 Ocular hypertension, left eye: Secondary | ICD-10-CM | POA: Diagnosis not present

## 2017-02-20 DIAGNOSIS — H25812 Combined forms of age-related cataract, left eye: Secondary | ICD-10-CM | POA: Diagnosis not present

## 2017-02-28 ENCOUNTER — Other Ambulatory Visit: Payer: Self-pay

## 2017-02-28 DIAGNOSIS — I10 Essential (primary) hypertension: Secondary | ICD-10-CM

## 2017-02-28 MED ORDER — POTASSIUM CHLORIDE CRYS ER 20 MEQ PO TBCR: 60.0000 meq | EXTENDED_RELEASE_TABLET | Freq: Every day | ORAL | 0 refills | Status: DC

## 2017-02-28 MED ORDER — HYDROCHLOROTHIAZIDE 25 MG PO TABS: 25.0000 mg | ORAL_TABLET | Freq: Every day | ORAL | 0 refills | Status: DC

## 2017-02-28 MED ORDER — LEVOTHYROXINE SODIUM 88 MCG PO TABS: ORAL_TABLET | ORAL | 0 refills | Status: DC

## 2017-02-28 NOTE — Telephone Encounter (Signed)
Sent Rx to pharmacy. LB 

## 2017-03-12 DIAGNOSIS — M961 Postlaminectomy syndrome, not elsewhere classified: Secondary | ICD-10-CM | POA: Diagnosis not present

## 2017-03-12 DIAGNOSIS — M62830 Muscle spasm of back: Secondary | ICD-10-CM | POA: Diagnosis not present

## 2017-03-12 DIAGNOSIS — G894 Chronic pain syndrome: Secondary | ICD-10-CM | POA: Diagnosis not present

## 2017-03-12 DIAGNOSIS — Z79891 Long term (current) use of opiate analgesic: Secondary | ICD-10-CM | POA: Diagnosis not present

## 2017-03-12 DIAGNOSIS — K5900 Constipation, unspecified: Secondary | ICD-10-CM | POA: Diagnosis not present

## 2017-03-14 ENCOUNTER — Encounter: Payer: Self-pay | Admitting: Family Medicine

## 2017-03-14 ENCOUNTER — Other Ambulatory Visit: Payer: Self-pay | Admitting: Family Medicine

## 2017-03-14 ENCOUNTER — Ambulatory Visit (INDEPENDENT_AMBULATORY_CARE_PROVIDER_SITE_OTHER): Payer: Medicare Other | Admitting: Family Medicine

## 2017-03-14 VITALS — BP 104/62 | HR 58 | Temp 97.9°F | Ht 61.0 in

## 2017-03-14 DIAGNOSIS — L03032 Cellulitis of left toe: Secondary | ICD-10-CM | POA: Diagnosis not present

## 2017-03-14 DIAGNOSIS — R600 Localized edema: Secondary | ICD-10-CM

## 2017-03-14 DIAGNOSIS — Z23 Encounter for immunization: Secondary | ICD-10-CM | POA: Diagnosis not present

## 2017-03-14 DIAGNOSIS — R21 Rash and other nonspecific skin eruption: Secondary | ICD-10-CM | POA: Diagnosis not present

## 2017-03-14 MED ORDER — DOXYCYCLINE HYCLATE 100 MG PO TABS: 100.0000 mg | ORAL_TABLET | Freq: Two times a day (BID) | ORAL | Status: DC

## 2017-03-14 MED ORDER — DOXYCYCLINE HYCLATE 100 MG PO CAPS: 100.0000 mg | ORAL_CAPSULE | Freq: Two times a day (BID) | ORAL | 0 refills | Status: DC

## 2017-03-14 MED ORDER — TRIAMCINOLONE ACETONIDE 0.1 % EX CREA
1.0000 | TOPICAL_CREAM | Freq: Two times a day (BID) | CUTANEOUS | 0 refills | Status: DC
Start: 2017-03-14 — End: 2017-07-01

## 2017-03-14 MED ORDER — FUROSEMIDE 20 MG PO TABS: ORAL_TABLET | ORAL | 1 refills | Status: DC

## 2017-03-14 NOTE — Progress Notes (Signed)
Thank you!  Sorry about that

## 2017-03-14 NOTE — Patient Instructions (Signed)

## 2017-03-14 NOTE — Progress Notes (Signed)
Patient ID: [name], female    DOB: 13-Nov-1945  Age: 71 y.o. MRN: [id number]    Subjective:  Subjective  HPI Joan [name] presents for pain and black and blue 4th toe on left foot with wound that will not heal.  She has been putting neosporin ointment on it.  It seems a little better.   Review of Systems  Constitutional: Negative for activity change, appetite change, fatigue and unexpected weight change.  Respiratory: Negative for cough and shortness of breath.   Cardiovascular: Negative for chest pain and palpitations.  Musculoskeletal: Negative for arthralgias and joint swelling.  Skin: Positive for color change and wound.  Psychiatric/Behavioral: Negative for behavioral problems and dysphoric mood. The patient is not nervous/anxious.     History Past Medical History:  Diagnosis Date  . Arthritis    "shoulders; wrist; probably spine" (06/24/2013)  . Chronic low back pain    followed by Dr Hardin Negus pain mgt  . Colon polyp   . Constipation   . Esophageal stricture   . Finger pain, left    2 fingers on left hand since wrist surgery  . GERD (gastroesophageal reflux disease)   . Heart murmur    "slight; not on RX" (06/24/2013)  . History of cardiac arrhythmia    cardiologist- Traci Turner  . Hx of colonic polyps 08/28/2004  . Hyperlipemia   . Hypertension   . Hypothyroidism   . Osteoarthritis of left knee    advanced  . PONV (postoperative nausea and vomiting)    Pt reports symptoms are the result of gallbladder and cholecystectomy, not anesthesia  . PVC's (premature ventricular contractions)   . Sleep apnea 1990's   "tested; tried mask; couldn't stand it; told me as long as I slept on my side I'd be ok" (06/24/2013)  . Thyroid nodule   . Tobacco abuse     She has a past surgical history that includes Lumbar fusion; Cesarean section (1972); Ankle surgery (Left, 1995); Infusion pump implantation (1990's); Knee arthroscopy (Left, 1991; ~ 1993); Elbow surgery  (1990's); Thyroidectomy (2012); Cervical spine surgery (2012); Foot surgery (Right, 2012); Shoulder arthroscopy (Left, 09/2011); Lumbar laminectomy/decompression microdiscectomy (N/A, 11/28/2012); Back surgery; Cholecystectomy (N/A, 04/16/2013); Laparoscopic lysis of adhesions (N/A, 04/16/2013); Posterior fusion lumbar spine (06/24/2013); Abdominal hysterectomy (1988); ORIF distal radius fracture (Left, 12/30/2013); ORIF wrist fracture (Left, 12/30/2013); Robotic assisted salpingo oophorectomy (Bilateral, 08/20/2014); and Abdominal hysterectomy.   Her family history includes Bone cancer in her sister; Breast cancer in her maternal aunt and sister; Cancer in her father; Coronary artery disease (age of onset: 64) in her mother; Diabetes in her mother and sister; Diabetes type II in her mother; Hypertension in her brother, mother, and sister; Pancreatic cancer in her father; Skin cancer in her mother; Thyroid cancer in her mother.She reports that she has been smoking Cigarettes.  She has a 35.00 pack-year smoking history. She has never used smokeless tobacco. She reports that she does not drink alcohol or use drugs.  Current Outpatient Prescriptions on File Prior to Visit  Medication Sig Dispense Refill  . bisacodyl (DULCOLAX) 5 MG EC tablet Take 5-10 mg by mouth at bedtime as needed for mild constipation (depending on amount of pain medication taken).  30 tablet   . carisoprodol (SOMA) 350 MG tablet Take 350 mg by mouth 2 (two) times daily. For muscle spasms    . dorzolamide-timolol (COSOPT) 22.3-6.8 MG/ML ophthalmic solution Place 1 drop into the left eye 2 (two) times daily.    Joan [name]  estradiol (ESTRACE) 0.5 MG tablet TAKE 1 BY MOUTH DAILY 90 tablet 1  . fenofibrate 160 MG tablet Take 1 tablet (160 mg total) by mouth daily. 90 tablet 1  . hydrochlorothiazide (HYDRODIURIL) 25 MG tablet Take 1 tablet (25 mg total) by mouth daily. 90 tablet 0  . HYDROcodone-acetaminophen (NORCO) 10-325 MG tablet Take 1 tablet by mouth  3 (three) times daily as needed for moderate pain.     Joan [name] levothyroxine (SYNTHROID, LEVOTHROID) 88 MCG tablet TAKE 1 TABLET BY MOUTH  DAILY BEFORE BREAKFAST 90 tablet 0  . magnesium gluconate (MAGONATE) 500 MG tablet Take 500 mg by mouth daily.    . potassium chloride SA (K-DUR,KLOR-CON) 20 MEQ tablet Take 3 tablets (60 mEq total) by mouth daily. 270 tablet 0  . simvastatin (ZOCOR) 20 MG tablet Take 1 tablet (20 mg total) by mouth at bedtime. 90 tablet 1  . traZODone (DESYREL) 50 MG tablet Take 1 tablet (50 mg total) by mouth at bedtime. 30 tablet 3   No current facility-administered medications on file prior to visit.      Objective:  Objective  Physical Exam  Musculoskeletal: She exhibits tenderness.       Right shoulder: She exhibits decreased range of motion and tenderness.  Skin: Skin is warm. Rash noted. Rash is vesicular. There is erythema.  Nursing note and vitals reviewed. 4th toe L foot--- + errythema with blister that burst  BP 104/62 (BP Location: Right Arm, Patient Position: Sitting, Cuff Size: Normal)   Pulse (!) 58   Temp 97.9 F (36.6 C) (Oral)   Ht 5\' 1"  (1.549 m)  Wt Readings from Last 3 Encounters:  01/24/17 157 lb (71.2 kg)  01/10/17 157 lb 6.4 oz (71.4 kg)  12/13/16 156 lb 12.8 oz (71.1 kg)     Lab Results  Component Value Date   WBC 5.2 05/22/2016   HGB 13.4 05/22/2016   HCT 40.0 05/22/2016   PLT 249.0 05/22/2016   GLUCOSE 117 (H) 03/14/2017   CHOL 157 12/13/2016   TRIG 75.0 12/13/2016   HDL 77.60 12/13/2016   LDLDIRECT 106.0 11/29/2015   LDLCALC 65 12/13/2016   ALT 16 12/13/2016   AST 20 12/13/2016   NA 141 03/14/2017   K 4.2 03/14/2017   CL 102 03/14/2017   CREATININE 1.05 03/14/2017   BUN 22 03/14/2017   CO2 32 03/14/2017   TSH 4.27 12/13/2016   INR 0.98 10/26/2010    No results found.   Assessment & Plan:  Plan  I have discontinued [name]'s diclofenac and Turmeric. I have also changed her furosemide. Additionally, I am having her  start on triamcinolone cream. Lastly, I am having her maintain her carisoprodol, bisacodyl, magnesium gluconate, estradiol, dorzolamide-timolol, fenofibrate, HYDROcodone-acetaminophen, simvastatin, traZODone, hydrochlorothiazide, potassium chloride SA, levothyroxine, and diclofenac sodium. We will continue to administer doxycycline.  Meds ordered this encounter  Medications  . diclofenac sodium (VOLTAREN) 1 % GEL    Sig: Apply topically 4 (four) times daily.  Joan [name] doxycycline (VIBRA-TABS) tablet 100 mg  . furosemide (LASIX) 20 MG tablet    Sig: TAKE 1-2 TABLET BY MOUTH ONCE DAILY AS NEEDED FOR  FLUID    Dispense:  180 tablet    Refill:  1    Please consider 90 day supplies to promote better adherence  . triamcinolone cream (KENALOG) 0.1 %    Sig: Apply 1 application topically 2 (two) times daily.    Dispense:  30 g    Refill:  0  Problem List Items Addressed This Visit    None    Visit Diagnoses    Need for prophylactic vaccination and inoculation against influenza    -  Primary   Relevant Orders   Flu vaccine HIGH DOSE PF (Fluzone High dose) (Completed)   Cellulitis of fourth toe of left foot       Relevant Medications   doxycycline (VIBRA-TABS) tablet 100 mg   Other Relevant Orders   Ambulatory referral to Podiatry   Lower extremity edema       Relevant Medications   furosemide (LASIX) 20 MG tablet   Other Relevant Orders   Basic metabolic panel (Completed)   Rash       Relevant Medications   triamcinolone cream (KENALOG) 0.1 %   Incomplete tear of right rotator cuff       Chronic right shoulder pain          Follow-up: Return if symptoms worsen or fail to improve.  [name], DO

## 2017-03-14 NOTE — Progress Notes (Signed)
Tiffany alerted me that rx was not received by pharmacy.  It looks like it was ordered as an "in office" medication Will re-rx doxycycline for her- I presume she was meant to have 100 mg BID for 10 days

## 2017-03-15 LAB — BASIC METABOLIC PANEL
BUN: 22 mg/dL (ref 6–23)
CO2: 32 mEq/L (ref 19–32)
Calcium: 8.8 mg/dL (ref 8.4–10.5)
Chloride: 102 mEq/L (ref 96–112)
Creatinine, Ser: 1.05 mg/dL (ref 0.40–1.20)
GFR: 54.7 mL/min — ABNORMAL LOW (ref >60.00)
Glucose, Bld: 117 mg/dL — ABNORMAL HIGH (ref 70–99)
Potassium: 4.2 mEq/L (ref 3.5–5.1)
Sodium: 141 mEq/L (ref 135–145)

## 2017-03-27 DIAGNOSIS — H04122 Dry eye syndrome of left lacrimal gland: Secondary | ICD-10-CM | POA: Diagnosis not present

## 2017-04-05 DIAGNOSIS — M1711 Unilateral primary osteoarthritis, right knee: Secondary | ICD-10-CM | POA: Diagnosis not present

## 2017-04-05 DIAGNOSIS — S8001XD Contusion of right knee, subsequent encounter: Secondary | ICD-10-CM | POA: Diagnosis not present

## 2017-04-12 DIAGNOSIS — M1711 Unilateral primary osteoarthritis, right knee: Secondary | ICD-10-CM | POA: Diagnosis not present

## 2017-04-15 ENCOUNTER — Other Ambulatory Visit: Payer: Self-pay | Admitting: Family Medicine

## 2017-04-15 DIAGNOSIS — I10 Essential (primary) hypertension: Secondary | ICD-10-CM

## 2017-04-22 DIAGNOSIS — M1711 Unilateral primary osteoarthritis, right knee: Secondary | ICD-10-CM | POA: Diagnosis not present

## 2017-04-24 DIAGNOSIS — H2511 Age-related nuclear cataract, right eye: Secondary | ICD-10-CM | POA: Diagnosis not present

## 2017-04-24 DIAGNOSIS — H25811 Combined forms of age-related cataract, right eye: Secondary | ICD-10-CM | POA: Diagnosis not present

## 2017-04-24 DIAGNOSIS — Z961 Presence of intraocular lens: Secondary | ICD-10-CM | POA: Diagnosis not present

## 2017-04-29 ENCOUNTER — Other Ambulatory Visit: Payer: Self-pay | Admitting: Family Medicine

## 2017-04-29 DIAGNOSIS — G4700 Insomnia, unspecified: Secondary | ICD-10-CM

## 2017-04-30 NOTE — Telephone Encounter (Signed)
Requesting Trazodone 50mg -Take 1 tablet by mouth at bedtime. Last refill:12/13/16;#30,3 Last OV:03/14/17 Please advise.//AB/CMA

## 2017-05-22 DIAGNOSIS — Z961 Presence of intraocular lens: Secondary | ICD-10-CM | POA: Diagnosis not present

## 2017-06-04 DIAGNOSIS — M25561 Pain in right knee: Secondary | ICD-10-CM | POA: Diagnosis not present

## 2017-06-04 DIAGNOSIS — M1711 Unilateral primary osteoarthritis, right knee: Secondary | ICD-10-CM | POA: Diagnosis not present

## 2017-06-04 DIAGNOSIS — S8001XD Contusion of right knee, subsequent encounter: Secondary | ICD-10-CM | POA: Diagnosis not present

## 2017-06-10 ENCOUNTER — Other Ambulatory Visit: Payer: Self-pay | Admitting: Family Medicine

## 2017-06-10 DIAGNOSIS — M961 Postlaminectomy syndrome, not elsewhere classified: Secondary | ICD-10-CM | POA: Diagnosis not present

## 2017-06-10 DIAGNOSIS — Z79891 Long term (current) use of opiate analgesic: Secondary | ICD-10-CM | POA: Diagnosis not present

## 2017-06-10 DIAGNOSIS — E785 Hyperlipidemia, unspecified: Secondary | ICD-10-CM

## 2017-06-10 DIAGNOSIS — G894 Chronic pain syndrome: Secondary | ICD-10-CM | POA: Diagnosis not present

## 2017-06-10 DIAGNOSIS — M62830 Muscle spasm of back: Secondary | ICD-10-CM | POA: Diagnosis not present

## 2017-06-14 DIAGNOSIS — L570 Actinic keratosis: Secondary | ICD-10-CM | POA: Diagnosis not present

## 2017-06-14 DIAGNOSIS — Z85828 Personal history of other malignant neoplasm of skin: Secondary | ICD-10-CM | POA: Diagnosis not present

## 2017-06-14 DIAGNOSIS — L821 Other seborrheic keratosis: Secondary | ICD-10-CM | POA: Diagnosis not present

## 2017-07-01 ENCOUNTER — Encounter: Payer: Self-pay | Admitting: Family Medicine

## 2017-07-01 ENCOUNTER — Ambulatory Visit (INDEPENDENT_AMBULATORY_CARE_PROVIDER_SITE_OTHER): Payer: Medicare Other | Admitting: Family Medicine

## 2017-07-01 VITALS — BP 122/70 | HR 63 | Temp 97.9°F | Resp 16 | Ht 61.0 in | Wt 161.4 lb

## 2017-07-01 DIAGNOSIS — I872 Venous insufficiency (chronic) (peripheral): Secondary | ICD-10-CM

## 2017-07-01 DIAGNOSIS — E039 Hypothyroidism, unspecified: Secondary | ICD-10-CM

## 2017-07-01 DIAGNOSIS — Z0000 Encounter for general adult medical examination without abnormal findings: Secondary | ICD-10-CM | POA: Diagnosis not present

## 2017-07-01 DIAGNOSIS — G4700 Insomnia, unspecified: Secondary | ICD-10-CM

## 2017-07-01 DIAGNOSIS — I10 Essential (primary) hypertension: Secondary | ICD-10-CM

## 2017-07-01 LAB — POC URINALSYSI DIPSTICK (AUTOMATED)
Bilirubin, UA: NEGATIVE
Blood, UA: NEGATIVE
Glucose, UA: NEGATIVE
Ketones, UA: NEGATIVE
Leukocytes, UA: NEGATIVE
Nitrite, UA: NEGATIVE
Protein, UA: NEGATIVE
Spec Grav, UA: 1.025 (ref 1.010–1.025)
Urobilinogen, UA: 0.2 E.U./dL
pH, UA: 6 (ref 5.0–8.0)

## 2017-07-01 LAB — LIPID PANEL
Cholesterol: 134 mg/dL (ref 0–200)
HDL: 62.6 mg/dL (ref >39.00)
LDL Cholesterol: 54 mg/dL (ref 0–99)
NonHDL: 71.19
Total CHOL/HDL Ratio: 2
Triglycerides: 85 mg/dL (ref 0.0–149.0)
VLDL: 17 mg/dL (ref 0.0–40.0)

## 2017-07-01 LAB — COMPREHENSIVE METABOLIC PANEL
ALT: 8 U/L (ref 0–35)
AST: 17 U/L (ref 0–37)
Albumin: 4.1 g/dL (ref 3.5–5.2)
Alkaline Phosphatase: 44 U/L (ref 39–117)
BUN: 24 mg/dL — ABNORMAL HIGH (ref 6–23)
CO2: 31 mEq/L (ref 19–32)
Calcium: 8.9 mg/dL (ref 8.4–10.5)
Chloride: 103 mEq/L (ref 96–112)
Creatinine, Ser: 1.07 mg/dL (ref 0.40–1.20)
GFR: 53.48 mL/min — ABNORMAL LOW (ref >60.00)
Glucose, Bld: 95 mg/dL (ref 70–99)
Potassium: 3.7 mEq/L (ref 3.5–5.1)
Sodium: 140 mEq/L (ref 135–145)
Total Bilirubin: 0.4 mg/dL (ref 0.2–1.2)
Total Protein: 6.5 g/dL (ref 6.0–8.3)

## 2017-07-01 LAB — CBC WITH DIFFERENTIAL/PLATELET
Basophils Absolute: 0 10*3/uL (ref 0.0–0.1)
Basophils Relative: 0.7 % (ref 0.0–3.0)
Eosinophils Absolute: 0 10*3/uL (ref 0.0–0.7)
Eosinophils Relative: 1.2 % (ref 0.0–5.0)
HCT: 36.5 % (ref 36.0–46.0)
Hemoglobin: 12.1 g/dL (ref 12.0–15.0)
Lymphocytes Relative: 36.2 % (ref 12.0–46.0)
Lymphs Abs: 1.4 10*3/uL (ref 0.7–4.0)
MCHC: 33.2 g/dL (ref 30.0–36.0)
MCV: 93.8 fl (ref 78.0–100.0)
Monocytes Absolute: 0.4 10*3/uL (ref 0.1–1.0)
Monocytes Relative: 11.2 % (ref 3.0–12.0)
Neutro Abs: 1.9 10*3/uL (ref 1.4–7.7)
Neutrophils Relative %: 50.7 % (ref 43.0–77.0)
Platelets: 239 10*3/uL (ref 150.0–400.0)
RBC: 3.9 Mil/uL (ref 3.87–5.11)
RDW: 13.6 % (ref 11.5–15.5)
WBC: 3.8 10*3/uL — ABNORMAL LOW (ref 4.0–10.5)

## 2017-07-01 LAB — TSH: TSH: 2.64 u[IU]/mL (ref 0.35–4.50)

## 2017-07-01 MED ORDER — TRIAMCINOLONE ACETONIDE 0.1 % EX OINT: 1.0000 "application " | TOPICAL_OINTMENT | Freq: Two times a day (BID) | CUTANEOUS | 0 refills | Status: DC

## 2017-07-01 MED ORDER — DOXYCYCLINE HYCLATE 100 MG PO CAPS: 100.0000 mg | ORAL_CAPSULE | Freq: Two times a day (BID) | ORAL | 0 refills | Status: DC

## 2017-07-01 MED ORDER — TRAZODONE HCL 50 MG PO TABS: 50.0000 mg | ORAL_TABLET | Freq: Every day | ORAL | 1 refills | Status: DC

## 2017-07-01 NOTE — Patient Instructions (Signed)
Preventive Care 72 Years and Older, Female Preventive care refers to lifestyle choices and visits with your health care provider that can promote health and wellness. What does preventive care include?  A yearly physical exam. This is also called an annual well check.  Dental exams once or twice a year.  Routine eye exams. Ask your health care provider how often you should have your eyes checked.  Personal lifestyle choices, including: ? Daily care of your teeth and gums. ? Regular physical activity. ? Eating a healthy diet. ? Avoiding tobacco and drug use. ? Limiting alcohol use. ? Practicing safe sex. ? Taking low-dose aspirin every day. ? Taking vitamin and mineral supplements as recommended by your health care provider. What happens during an annual well check? The services and screenings done by your health care provider during your annual well check will depend on your age, overall health, lifestyle risk factors, and family history of disease. Counseling Your health care provider may ask you questions about your:  Alcohol use.  Tobacco use.  Drug use.  Emotional well-being.  Home and relationship well-being.  Sexual activity.  Eating habits.  History of falls.  Memory and ability to understand (cognition).  Work and work environment.  Reproductive health.  Screening You may have the following tests or measurements:  Height, weight, and BMI.  Blood pressure.  Lipid and cholesterol levels. These may be checked every 5 years, or more frequently if you are over 50 years old.  Skin check.  Lung cancer screening. You may have this screening every year starting at age 55 if you have a 30-pack-year history of smoking and currently smoke or have quit within the past 15 years.  Fecal occult blood test (FOBT) of the stool. You may have this test every year starting at age 50.  Flexible sigmoidoscopy or colonoscopy. You may have a sigmoidoscopy every 5 years or  a colonoscopy every 10 years starting at age 50.  Hepatitis C blood test.  Hepatitis B blood test.  Sexually transmitted disease (STD) testing.  Diabetes screening. This is done by checking your blood sugar (glucose) after you have not eaten for a while (fasting). You may have this done every 1-3 years.  Bone density scan. This is done to screen for osteoporosis. You may have this done starting at age 65.  Mammogram. This may be done every 1-2 years. Talk to your health care provider about how often you should have regular mammograms.  Talk with your health care provider about your test results, treatment options, and if necessary, the need for more tests. Vaccines Your health care provider may recommend certain vaccines, such as:  Influenza vaccine. This is recommended every year.  Tetanus, diphtheria, and acellular pertussis (Tdap, Td) vaccine. You may need a Td booster every 10 years.  Varicella vaccine. You may need this if you have not been vaccinated.  Zoster vaccine. You may need this after age 60.  Measles, mumps, and rubella (MMR) vaccine. You may need at least one dose of MMR if you were born in 1957 or later. You may also need a second dose.  Pneumococcal 13-valent conjugate (PCV13) vaccine. One dose is recommended after age 65.  Pneumococcal polysaccharide (PPSV23) vaccine. One dose is recommended after age 65.  Meningococcal vaccine. You may need this if you have certain conditions.  Hepatitis A vaccine. You may need this if you have certain conditions or if you travel or work in places where you may be exposed to hepatitis   A.  Hepatitis B vaccine. You may need this if you have certain conditions or if you travel or work in places where you may be exposed to hepatitis B.  Haemophilus influenzae type b (Hib) vaccine. You may need this if you have certain conditions.  Talk to your health care provider about which screenings and vaccines you need and how often you  need them. This information is not intended to replace advice given to you by your health care provider. Make sure you discuss any questions you have with your health care provider. Document Released: 07/22/2015 Document Revised: 03/14/2016 Document Reviewed: 04/26/2015 Elsevier Interactive Patient Education  2018 Else[employer] Inc[employer]  

## 2017-07-01 NOTE — Progress Notes (Signed)
Subjective:     Joan [name] is a 71 y.o. female and is here for a comprehensive physical exam. The patient reports no new problems.  Social History   Socioeconomic History  . Marital status: Divorced    Spouse name: Not on file  . Number of children: Not on file  . Years of education: Not on file  . Highest education level: Not on file  Social Needs  . Financial resource strain: Not on file  . Food insecurity - worry: Not on file  . Food insecurity - inability: Not on file  . Transportation needs - medical: Not on file  . Transportation needs - non-medical: Not on file  Occupational History  . Not on file  Tobacco Use  . Smoking status: Current Every Day Smoker    Packs/day: 1.00    Years: 35.00    Pack years: 35.00    Types: Cigarettes    Last attempt to quit: 11/22/2015    Years since quitting: 1.6  . Smokeless tobacco: Never Used  Substance and Sexual Activity  . Alcohol use: No    Alcohol/week: 0.0 oz  . Drug use: No  . Sexual activity: Not Currently  Other Topics Concern  . Not on file  Social History Narrative   Divorced   Current Smoker  1 ppd -  20 yrs      Alcohol use-no       [employer] - laid off       Physician roster:   Dr. Elta Guadeloupe Philips - pain management   Dr. Philis Pique - GYN   Dr. Saintclair Halsted - Neurosurgery   Dr. Ronnald Ramp - dermatology   Dr. Caralyn Guile - orthopedics   Dr. Fransico Him - cardiology   Dr. Miller-ophthalmology   Health Maintenance  Topic Date Due  . MAMMOGRAM  12/12/2017  . COLONOSCOPY  03/27/2020  . TETANUS/TDAP  09/07/2024  . INFLUENZA VACCINE  Completed  . DEXA SCAN  Completed  . Hepatitis C Screening  Completed  . PNA vac Low Risk Adult  Completed    The following portions of the patient's history were reviewed and updated as appropriate:  She  has a past medical history of Arthritis, Chronic low back pain, Colon polyp, Constipation, Esophageal stricture, Finger pain, left, GERD (gastroesophageal reflux disease),  Heart murmur, History of cardiac arrhythmia, colonic polyps (08/28/2004), Hyperlipemia, Hypertension, Hypothyroidism, Osteoarthritis of left knee, PONV (postoperative nausea and vomiting), PVC's (premature ventricular contractions), Sleep apnea (1990's), Thyroid nodule, and Tobacco abuse. She does not have any pertinent problems on file. She  has a past surgical history that includes Lumbar fusion; Cesarean section (1972); Ankle surgery (Left, 1995); Infusion pump implantation (1990's); Knee arthroscopy (Left, 1991; ~ 1993); Elbow surgery (1990's); Thyroidectomy (2012); Cervical spine surgery (2012); Foot surgery (Right, 2012); Shoulder arthroscopy (Left, 09/2011); Lumbar laminectomy/decompression microdiscectomy (N/A, 11/28/2012); Back surgery; Cholecystectomy (N/A, 04/16/2013); Laparoscopic lysis of adhesions (N/A, 04/16/2013); Posterior fusion lumbar spine (06/24/2013); Abdominal hysterectomy (1988); ORIF distal radius fracture (Left, 12/30/2013); ORIF wrist fracture (Left, 12/30/2013); Robotic assisted salpingo oophorectomy (Bilateral, 08/20/2014); and Abdominal hysterectomy. Her family history includes Bone cancer in her sister; Breast cancer in her maternal aunt and sister; Cancer in her father; Coronary artery disease (age of onset: 65) in her mother; Diabetes in her mother and sister; Diabetes type II in her mother; Hypertension in her brother, mother, and sister; Pancreatic cancer in her father; Skin cancer in her mother; Thyroid cancer in her mother. She  reports that she has been  smoking cigarettes.  She has a 35.00 pack-year smoking history. she has never used smokeless tobacco. She reports that she does not drink alcohol or use drugs. She has a current medication list which includes the following prescription(s): bisacodyl, carisoprodol, diclofenac sodium, dorzolamide-timolol, estradiol, fenofibrate, furosemide, hydrochlorothiazide, hydrocodone-acetaminophen, levothyroxine, magnesium gluconate, potassium  chloride sa, simvastatin, trazodone, doxycycline, and triamcinolone ointment, and the following Facility-Administered Medications: doxycycline. Current Outpatient Medications on File Prior to Visit  Medication Sig Dispense Refill  . bisacodyl (DULCOLAX) 5 MG EC tablet Take 5-10 mg by mouth at bedtime as needed for mild constipation (depending on amount of pain medication taken).  30 tablet   . carisoprodol (SOMA) 350 MG tablet Take 350 mg by mouth 2 (two) times daily. For muscle spasms    . diclofenac sodium (VOLTAREN) 1 % GEL Apply topically 2 (two) times daily.     . dorzolamide-timolol (COSOPT) 22.3-6.8 MG/ML ophthalmic solution Place 1 drop into the left eye 2 (two) times daily.    Marland Kitchen estradiol (ESTRACE) 0.5 MG tablet TAKE 1 BY MOUTH DAILY 90 tablet 1  . fenofibrate 160 MG tablet TAKE ONE TABLET BY MOUTH ONCE DAILY. 90 tablet 0  . furosemide (LASIX) 20 MG tablet TAKE 1-2 TABLET BY MOUTH ONCE DAILY AS NEEDED FOR  FLUID 180 tablet 1  . hydrochlorothiazide (HYDRODIURIL) 25 MG tablet TAKE 1 TABLET BY MOUTH  DAILY 90 tablet 1  . HYDROcodone-acetaminophen (NORCO) 10-325 MG tablet Take 1 tablet by mouth 3 (three) times daily as needed for moderate pain.     Marland Kitchen levothyroxine (SYNTHROID, LEVOTHROID) 88 MCG tablet TAKE 1 TABLET BY MOUTH  DAILY BEFORE BREAKFAST 90 tablet 1  . magnesium gluconate (MAGONATE) 500 MG tablet Take 500 mg by mouth daily.    . potassium chloride SA (K-DUR,KLOR-CON) 20 MEQ tablet TAKE 3 TABLETS BY MOUTH  DAILY 270 tablet 1  . simvastatin (ZOCOR) 20 MG tablet Take 1 tablet (20 mg total) by mouth at bedtime. 90 tablet 1   Current Facility-Administered Medications on File Prior to Visit  Medication Dose Route Frequency Provider Last Rate Last Dose  . doxycycline (VIBRA-TABS) tablet 100 mg  100 mg Oral Q12H Roma Schanz R, DO       She is allergic to morphine and related; amitriptyline; gabapentin; and triamterene..  Review of Systems Review of Systems  Constitutional:  Negative for activity change, appetite change and fatigue.  HENT: Negative for hearing loss, congestion, tinnitus and ear discharge.  dentist q42m Eyes: Negative for visual disturbance (see optho q1y -- vision corrected  with glasses).  Respiratory: Negative for cough, chest tightness and shortness of breath.   Cardiovascular: Negative for chest pain, palpitations and leg swelling.  Gastrointestinal: Negative for abdominal pain, diarrhea, constipation and abdominal distention.  Genitourinary: Negative for urgency, frequency, decreased urine volume and difficulty urinating.  Musculoskeletal: Negative for back pain, arthralgias and gait problem.  Skin: Negative for color change, pallor and rash.  Neurological: Negative for dizziness, light-headedness, numbness and headaches.  Hematological: Negative for adenopathy. Does not bruise/bleed easily.  Psychiatric/Behavioral: Negative for suicidal ideas, confusion, sleep disturbance, self-injury, dysphoric mood, decreased concentration and agitation.       Objective:    BP 122/70 (BP Location: Right Arm, Cuff Size: Normal)   Pulse 63   Temp 97.9 F (36.6 C) (Oral)   Resp 16   Ht 5\' 1"  (1.549 m)   Wt 161 lb 6.4 oz (73.2 kg)   SpO2 93%   BMI 30.50 kg/m  General appearance:  alert, cooperative, appears stated age and no distress Head: Normocephalic, without obvious abnormality, atraumatic Eyes: negative findings: lids and lashes normal and pupils equal, round, reactive to light and accomodation Ears: normal TM's and external ear canals both ears Nose: Nares normal. Septum midline. Mucosa normal. No drainage or sinus tenderness. Throat: lips, mucosa, and tongue normal; teeth and gums normal Neck: no adenopathy, no carotid bruit, no JVD, supple, symmetrical, trachea midline and thyroid not enlarged, symmetric, no tenderness/mass/nodules Back: symmetric, no curvature. ROM normal. No CVA tenderness. Lungs: clear to auscultation  bilaterally Breasts: normal appearance, no masses or tenderness Heart: regular rate and rhythm, S1, S2 normal, no murmur, click, rub or gallop Abdomen: soft, non-tender; bowel sounds normal; no masses,  no organomegaly Pelvic: deferred Extremities: extremities normal, atraumatic, no cyanosis or edema Pulses: 2+ and symmetric Skin: hyperpigmentation - lower leg(s) bilateral Lymph nodes: Cervical, supraclavicular, and axillary nodes normal. Neurologic: Alert and oriented X 3, normal strength and tone. Normal symmetric reflexes. Normal coordination and gait --- pain in R Knee   Assessment:    Healthy female exam.      Plan:    ghm utd Check labs  See After Visit Summary for Counseling Recommendations    1. Essential hypertension Well controlled, no changes to meds. Encouraged heart healthy diet such as the DASH diet and exercise as tolerated.  - POCT Urinalysis Dipstick (Automated) - CBC with Differential/Platelet - Comprehensive metabolic panel - Lipid panel  2. Hypothyroidism, unspecified type Check labs and con't meds  - TSH  3. Preventative health care See above   4. Insomnia, unspecified type  - traZODone (DESYREL) 50 MG tablet; Take 1 tablet (50 mg total) by mouth at bedtime.  Dispense: 90 tablet; Refill: 1  5. Venous stasis dermatitis of both lower extremities con't amilactin  - triamcinolone ointment (KENALOG) 0.1 %; Apply 1 application topically 2 (two) times daily.  Dispense: 30 g; Refill: 0

## 2017-07-10 ENCOUNTER — Other Ambulatory Visit: Payer: Self-pay | Admitting: Family Medicine

## 2017-07-10 ENCOUNTER — Other Ambulatory Visit: Payer: Self-pay

## 2017-07-10 DIAGNOSIS — E785 Hyperlipidemia, unspecified: Secondary | ICD-10-CM

## 2017-07-10 DIAGNOSIS — I10 Essential (primary) hypertension: Secondary | ICD-10-CM

## 2017-07-10 DIAGNOSIS — D649 Anemia, unspecified: Secondary | ICD-10-CM

## 2017-07-11 DIAGNOSIS — M1712 Unilateral primary osteoarthritis, left knee: Secondary | ICD-10-CM | POA: Diagnosis not present

## 2017-07-11 DIAGNOSIS — M170 Bilateral primary osteoarthritis of knee: Secondary | ICD-10-CM | POA: Diagnosis not present

## 2017-07-11 DIAGNOSIS — M1711 Unilateral primary osteoarthritis, right knee: Secondary | ICD-10-CM | POA: Diagnosis not present

## 2017-07-18 ENCOUNTER — Telehealth: Payer: Self-pay

## 2017-07-18 NOTE — Telephone Encounter (Signed)
Patient well known to me. Previously seen by Dr. Radford Pax in 2016 for preop clearance. I last saw the patient in July 2018 for atypical chest pain. Myoview obtained on 01/24/2017 was normal. She does not have prior h/o CAD  I called the patient and left her a message, as long as she does not have significant changes since last office visit and stress test, she should be cleared for surgery. She will call us back and talk to on-call Preop APP  Signed, Almyra Deforest PA Pager: [phone]

## 2017-07-18 NOTE — Telephone Encounter (Signed)
   [facility] Pre-operative Risk Assessment    Request for surgical clearance:  1. What type of surgery is being performed? Right TKA-Medial and Lateral W/WO Patella Resurfacing  2. When is this surgery scheduled? 09/09/17   3. Are there any medications that need to be held prior to surgery and how long? no  4. Practice name and name of physician performing surgery? [facility]   Dr.Frank Aluisio  5. What is your office phone and fax number? Phone # [phone]   Fax # [phone]  6. Anesthesia type (None, local, MAC, general) ? unknown   Kathyrn Lass 07/18/2017, 9:24 AM  _________________________________________________________________   (provider comments below)

## 2017-07-19 NOTE — Telephone Encounter (Signed)
Joan [name] is returning a call. Thanks

## 2017-07-19 NOTE — Telephone Encounter (Signed)
Faxed over hard copy of surg clearance to Dr. Anne Fu office.

## 2017-07-19 NOTE — Telephone Encounter (Signed)
  Pre-operative Risk Assessment - Provider Statement    Patient ID:  Joan [name], DOB: [birth year], MRN: [id number]   Ms. Cortopassi's chart has been reviewed for pre-operative risk assessment.  The patient was contacted 07/19/2017 in reference to pre-operative risk assessment for pending surgery as outlined.  She was last seen on 01/10/17 by Almyra Deforest, PA-C.    According to ACC/AHA guidelines, she does not require further testing and may proceed with surgery at acceptable risk.   Clearance letter has been faxed to the requesting provider.  Please contact requesting provider to ensure that they have received the fax.  Signed,  Richardson Dopp, PA-C  07/19/2017 10:04 AM

## 2017-07-23 ENCOUNTER — Telehealth: Payer: Self-pay | Admitting: *Deleted

## 2017-07-23 NOTE — Telephone Encounter (Signed)
Received Medical/Surgical Clearance Form from [facility], attached last OV note and copy of Cardiology Clearance given; forwarded to provider/SLS 01/15

## 2017-08-06 ENCOUNTER — Ambulatory Visit: Payer: Self-pay | Admitting: Orthopedic Surgery

## 2017-08-06 NOTE — Progress Notes (Signed)
Please place orders in Epic as patient is being scheduled for a pre-op appointment! Thank you! 

## 2017-08-28 ENCOUNTER — Ambulatory Visit: Payer: Self-pay | Admitting: Orthopedic Surgery

## 2017-08-28 NOTE — H&P (View-Only) (Signed)
NATURE, KUEKER (72yo, F) DOB September 05, 1945   Chief Complaint Right Knee Pain H&P right total knee 09-09-2017  Patient's Care Team Primary Care Provider: Garnet Koyanagi DO: [address], [place], [place] [address], Ph [phone], Fax [phone] NPI: [id number]  Cardiologist: Arthur: [address] [place], [place], [place] [address], Ph [phone], Fax [phone]   Patient's Pharmacies [place] [address] [facility]): [address] [place], [place] [address], Ph [phone], Fax [phone]   Vitals Ht: 5 ft  BP - 122/68 Pulse - 64   Allergies Reviewed Allergies AMITRIPTYLINE: Hives  GABAPENTIN: Rash  MORPHINE: Edema    Medications Reviewed Medications bisacodyl 08/27/17   entered [employer] carisoprodol 350 mg tablet 06/10/17   filled PRESCRIPTION SOLUTIONS clonazePAM 1 mg tablet 08/03/16   filled PRESCRIPTION SOLUTIONS diclofenac 1 % topical gel 10/22/16   filled [employer] SOL[employer]IONS dorzolamide 2 % eye drops 08/24/16   filled PRES[employer] SOLUTIONS dorzolamide 22.3 mg-timolol 6.8 mg/mL eye drops 07/01/17   filled PRESCRIPTION SOLUTIONS dorzolamide-timolol (PF) 2 %-0.5 % eye drops in a dropperette 08/27/17   entered Faith Brown estradiol 08/27/17   entered [employer] estradiol 0.5 mg tablet 06/24/17   filled PRES[employer] SOLUTIONS fenofibrate 160 mg tablet 06/10/17   filled [employer] SOL[employer]IONS furosemide 08/27/17   entered Faith Brown furosemide 20 mg tablet 06/11/17   filled PRES[employer] SOLUTIONS gentamicin 0.3 % eye drops 04/29/17   filled [employer] SOL[employer]IONS hydroCHLOROthiazide 25 mg tablet 08/20/17   filled [employer] SOL[employer]IONS HYDROcodone 10 mg-acetaminophen 325 mg tablet 06/10/17   filled [employer] SOL[employer] ketorolac 0.5 % eye drops 04/29/17   filled [employer] levothyroxine 88 mcg tablet 08/20/17   filled [employer] magnesium 08/27/17    entered Faith Brown ofloxacin 0.3 % eye drops 02/15/17   filled [employer] pilocarpine 2 % eye drops 02/11/17   filled [employer] potassium 08/27/17   entered Faith Brown prednisoLONE acetate 1 % eye drops,suspension 02/14/17   filled [employer] Restasis 0.05 % eye drops in a dropperette 04/05/17   filled [employer] simvastatin 20 mg tablet 08/20/17   filled [employer] SOL[employer] timolol maleate 0.5 % eye drops 08/24/16   filled [employer] SOL[employer] traZODone 50 mg tablet 07/01/17   filled [employer] triamcinolone 0.1 % topical ointment and dimethicone 5 % topical cream 08/27/17   entered Faith Brown triamcinolone acetonide 0.1 % topical cream 03/14/17   filled [employer] triamcinolone acetonide 0.1 % topical ointment 07/01/17   filled PRESCRIPTION SOLUTIONS            Problems Reviewed Problems Osteoarthritis of right knee joint    Family History Reviewed Family History Mother - Arthritis   - Heart disease   - Mother deceased   - Myocardial infarction Sister - Diabetes mellitus   - Malignant tumor of breast   - Malignant neoplasm of bone Father - Father deceased   - Malignant tumor of pancreas   Social History Reviewed Social History Smoking Status: Current every day smoker Smoker (1 PPW) Tobacco-years of use: 30 Chewing tobacco: none Alcohol intake: None Hand Dominance: Right Work related injury?: N Advance directive: N Medical Power of Attorney: N   Surgical History Reviewed Surgical History Cataract - 07/09/2016 Discectomy - 07/09/2014 Hand Surgery - 07/09/2013 Hysterectomy - 07/09/2013 Discectomy - 07/09/2012 Cholecystectomy - 07/10/2011 Shoulder Arthroscopy - 07/10/2011 Discectomy - 07/09/2010 Discectomy - 07/09/2004 Discectomy - 07/09/2002 Discectomy - 07/09/2000 Discectomy - 07/10/1999 Discectomy - 07/09/1997 Knee Arthroscopy - 07/09/1996  Caesarian section -  07/09/1970 GYN History Most Recent Bone Density: 12/13/2015. Most Recent Mammogram: 12/14/2016.   Past Medical History Reviewed Past Medical History GERD/Reflux: Y High Cholesterol: Y Joint Pain: Y Thyroid Problems/Goiter: Y - Surgical Hypothyroidism Notes: Glaucoma,  Cataract,  Sleep Apnea,  Heart Murmur,  Hemorrhoids,  Childhood Scarlert Fever,  Childhood Measels,  Menopause Chronic Back Pain    HPI Patient is a 72 year old female scheduled for a right total knee with Dr. Wynelle Link on 09/09/2017 at [facility]. The patient was seen following transferring care from Dr. Delilah Shan . Described pain in the right medial knee. She states that the left knee has recently started to bother her as well. The right knee has been ongoing for about a year. It is aching and worsening with time. Severity: moderate Alleviating Factors: brace; She wears a knee sleeve on both knees, and economy hinge on the right  Associated Symptoms: swelling; catching/locking; instability She has had previous Injections which have helped temporarily. She also had Euflexxa several months ago, with three cortisone injections prior to that. She has pain in both knees but the RIGHT is far more symptomatic than the LEFT. The RIGHT really started getting bad about 15 months ago when she fell directly on the knee in October 2017. The pain has gotten progressively worse and is now bothering her at all times. It is limiting what she can and cannot do. It is keeping her awake at night. She has had injections of cortisone and Visco supplements without tremendous benefit. Her LEFT knee hurts also to a lesser degree. She has not had any recent intervention on the LEFT knee. She is at a stage of the RIGHT knee is essentially taken over her life and feels like she needs to have something done about it.  Radiographs AP and lateral of the knees show bone-on-bone arthritis in the medial and patellofemoral compartments of the RIGHT knee. She  also has arthritic change on the LEFT but not quite as bad. Patient has significant pain and dysfunction in their knee. They have had injections and failed nonoperative management. At this point the pain and dysfunction have essentially taken over their life. The most predictable means of improving pain and function is total knee arthroplasty.We discussed the procedure risks and potential complications and rehab course in detail and the patient would like to go ahead and proceed with total knee arthroplasty.    ROS Constitutional: Constitutional: no fever, chills, night sweats, or significant weight loss.  Cardiovascular: Cardiovascular: no palpitations or chest pain.  Respiratory: Respiratory: no cough or shortness of breath and No COPD.  Gastrointestinal: Gastrointestinal: no vomiting or nausea.  Musculoskeletal: Musculoskeletal: Joint Pain and swelling in Joints and back pain.  Neurologic: Neurologic: no numbness, tingling, or difficulty with balance.   Physical Exam Patient is a 72 year old female.  General Mental Status - Alert, cooperative and good historian. General Appearance - pleasant, Not in acute distress. Orientation - Oriented X3. Build & Nutrition - Well nourished and Well developed.  Head and Neck Head - normocephalic, atraumatic . Neck Global Assessment - supple, no bruit auscultated on the right, no bruit auscultated on the left.  Eye Wears glasses Pupil - Bilateral - PERR Motion - Bilateral - EOMI.  Chest and Lung Exam Auscultation Breath sounds - clear at anterior chest wall and clear at posterior chest wall. Adventitious sounds - No Adventitious sounds.  Cardiovascular Auscultation Rhythm - Regular rate and rhythm. Heart Sounds - S1 WNL and S2 WNL.  Murmurs & Other Heart Sounds - Auscultation of the heart reveals - No Murmurs.  Abdomen Palpation/Percussion Tenderness - Abdomen is non-tender to palpation. Abdomen is soft. Auscultation Auscultation  of the abdomen reveals - Bowel sounds normal.  Genitourinary Note: Not done, not pertinent to present illness  Musculoskeletal Examination of the right hip shows flexion to 120 rotation in 30 abduction 40 and external rotation of 40. There is no tenderness over the greater trochanter. There is no pain on provocative testing of the hip.Evaluation of the left hip shows flexion to 120 rotation in 30 out 40 and abduction 40 without discomfort. There is no tenderness over the greater trochanter. There is no pain on provocative testing of the hip. Her LEFT knee shows no effusion. Range of motion is 5-125. There is moderate crepitus on range of motion. She has slight tenderness medial greater than lateral with no instability. RIGHT knee shows no effusion. Range of motion 10-110. There is marked crepitus on range of motion. She is tender medial greater than lateral with no instability. Pulses, sensation and motor are intact. Gait pattern is antalgic on the RIGHT.  Radiographs AP and lateral of the knees show bone-on-bone arthritis in the medial and patellofemoral compartments of the RIGHT knee. She also has arthritic change on the LEFT but not quite as bad. Procedure Documentation None recorded.   Assessment / Plan 1. Osteoarthritis of right knee joint M17.11: Unilateral primary osteoarthritis, right knee  Patient Instructions Surgical Plans: Right Total Knee Replacement Disposition: Home PCP: Dr. Etter Sjogren- [place] - cleared to proceed with surgery. Cards: Dr. Radford Pax / Richardson Dopp, PA-C - "...no further cardiovascular testing needed. The patient may proceed to surgery at acceptable risk." IV TXA Anesthesia Issues:None Patient was instructed on what medications to stop prior to surgery. - Follow up visit in 2 weeks with Dr. Wynelle Link - Begin physical therapy following surgery - Pre-operative lab work as pre Pre-Surgical Testing - Prescriptions will be provided in hospital at time of  discharge  Return to [place], MD for [place] at [facility] on 09/24/2017 at 03:45 PM  Encounter signed-off by Mickel Crow, PA-C

## 2017-08-28 NOTE — H&P (Signed)
Joan [name], Joan [name] (72yo, F) DOB 29-Mar-1946   Chief Complaint Right Knee Pain H&P right total knee 09-09-2017  Patient's Care Team Primary Care Provider: Garnet Koyanagi DO: [address], [place], [place] [address], Ph [phone], Fax [phone] NPI: [id number]  Cardiologist: [place]: [address] [place], [place], [place] [address], Ph [phone], Fax [phone]   Patient's Pharmacies [place] [address] [facility]): [address] [place], [place] [address], Ph [phone], Fax [phone]   Vitals Ht: 5 ft  BP - 122/68 Pulse - 64   Allergies Reviewed Allergies AMITRIPTYLINE: Hives  GABAPENTIN: Rash  MORPHINE: Edema    Medications Reviewed Medications bisacodyl 08/27/17   entered [employer] carisoprodol 350 mg tablet 06/10/17   filled PRESCRIPTION SOLUTIONS clonazePAM 1 mg tablet 08/03/16   filled PRES[employer] SOLUTIONS diclofenac 1 % topical gel 10/22/16   filled PRES[employer] SOLUTIONS dorzolamide 2 % eye drops 08/24/16   filled PRES[employer]UTIONS dorzolamide 22.3 mg-timolol 6.8 mg/mL eye drops 07/01/17   filled PRESCRIPTION SOLUTIONS dorzolamide-timolol (PF) 2 %-0.5 % eye drops in a dropperette 08/27/17   entered Faith Brown estradiol 08/27/17   entered [employer] estradiol 0.5 mg tablet 06/24/17   filled PRESCRIPTION SOLUTIONS fenofibrate 160 mg tablet 06/10/17   filled PRESCRIPTION SOLUTIONS furosemide 08/27/17   entered Faith Brown furosemide 20 mg tablet 06/11/17   filled PRESCRIPTION SOLUTIONS gentamicin 0.3 % eye drops 04/29/17   filled PRES[employer] SOL[employer]IONS hydroCHLOROthiazide 25 mg tablet 08/20/17   filled [employer] SOL[employer]IONS HYDROcodone 10 mg-acetaminophen 325 mg tablet 06/10/17   filled PRES[employer] SOLUTIONS ketorolac 0.5 % eye drops 04/29/17   filled [employer] levothyroxine 88 mcg tablet 08/20/17   filled [employer] SOL[employer] magnesium 08/27/17    entered Faith Brown ofloxacin 0.3 % eye drops 02/15/17   filled PRES[employer] SOLUTIONS pilocarpine 2 % eye drops 02/11/17   filled [employer] potassium 08/27/17   entered Faith Brown prednisoLONE acetate 1 % eye drops,suspension 02/14/17   filled PRES[employer] SOLUTIONS Restasis 0.05 % eye drops in a dropperette 04/05/17   filled [employer] simvastatin 20 mg tablet 08/20/17   filled PRES[employer] SOLUTIONS timolol maleate 0.5 % eye drops 08/24/16   filled [employer] traZODone 50 mg tablet 07/01/17   filled [employer] triamcinolone 0.1 % topical ointment and dimethicone 5 % topical cream 08/27/17   entered Faith Brown triamcinolone acetonide 0.1 % topical cream 03/14/17   filled [employer] triamcinolone acetonide 0.1 % topical ointment 07/01/17   filled PRESCRIPTION SOLUTIONS            Problems Reviewed Problems Osteoarthritis of right knee joint    Family History Reviewed Family History Mother - Arthritis   - Heart disease   - Mother deceased   - Myocardial infarction Sister - Diabetes mellitus   - Malignant tumor of breast   - Malignant neoplasm of bone Father - Father deceased   - Malignant tumor of pancreas   Social History Reviewed Social History Smoking Status: Current every day smoker Smoker (1 PPW) Tobacco-years of use: 30 Chewing tobacco: none Alcohol intake: None Hand Dominance: Right Work related injury?: N Advance directive: N Medical Power of Attorney: N   Surgical History Reviewed Surgical History Cataract - 07/09/2016 Discectomy - 07/09/2014 Hand Surgery - 07/09/2013 Hysterectomy - 07/09/2013 Discectomy - 07/09/2012 Cholecystectomy - 07/10/2011 Shoulder Arthroscopy - 07/10/2011 Discectomy - 07/09/2010 Discectomy - 07/09/2004 Discectomy - 07/09/2002 Discectomy - 07/09/2000 Discectomy - 07/10/1999 Discectomy - 07/09/1997 Knee Arthroscopy - 07/09/1996  Caesarian section -  07/09/1970 GYN History Most Recent Bone Density: 12/13/2015. Most Recent Mammogram: 12/14/2016.   Past Medical History Reviewed Past Medical History GERD/Reflux: Y High Cholesterol: Y Joint Pain: Y Thyroid Problems/Goiter: Y - Surgical Hypothyroidism Notes: Glaucoma,  Cataract,  Sleep Apnea,  Heart Murmur,  Hemorrhoids,  Childhood Scarlert Fever,  Childhood Measels,  Menopause Chronic Back Pain    HPI Patient is a 72 year old female scheduled for a right total knee with Dr. Wynelle Link on 09/09/2017 at [facility]. The patient was seen following transferring care from Dr. Delilah Shan . Described pain in the right medial knee. She states that the left knee has recently started to bother her as well. The right knee has been ongoing for about a year. It is aching and worsening with time. Severity: moderate Alleviating Factors: brace; She wears a knee sleeve on both knees, and economy hinge on the right  Associated Symptoms: swelling; catching/locking; instability She has had previous Injections which have helped temporarily. She also had Euflexxa several months ago, with three cortisone injections prior to that. She has pain in both knees but the RIGHT is far more symptomatic than the LEFT. The RIGHT really started getting bad about 15 months ago when she fell directly on the knee in October 2017. The pain has gotten progressively worse and is now bothering her at all times. It is limiting what she can and cannot do. It is keeping her awake at night. She has had injections of cortisone and Visco supplements without tremendous benefit. Her LEFT knee hurts also to a lesser degree. She has not had any recent intervention on the LEFT knee. She is at a stage of the RIGHT knee is essentially taken over her life and feels like she needs to have something done about it.  Radiographs AP and lateral of the knees show bone-on-bone arthritis in the medial and patellofemoral compartments of the RIGHT knee. She  also has arthritic change on the LEFT but not quite as bad. Patient has significant pain and dysfunction in their knee. They have had injections and failed nonoperative management. At this point the pain and dysfunction have essentially taken over their life. The most predictable means of improving pain and function is total knee arthroplasty.We discussed the procedure risks and potential complications and rehab course in detail and the patient would like to go ahead and proceed with total knee arthroplasty.    ROS Constitutional: Constitutional: no fever, chills, night sweats, or significant weight loss.  Cardiovascular: Cardiovascular: no palpitations or chest pain.  Respiratory: Respiratory: no cough or shortness of breath and No COPD.  Gastrointestinal: Gastrointestinal: no vomiting or nausea.  Musculoskeletal: Musculoskeletal: Joint Pain and swelling in Joints and back pain.  Neurologic: Neurologic: no numbness, tingling, or difficulty with balance.   Physical Exam Patient is a 72 year old female.  General Mental Status - Alert, cooperative and good historian. General Appearance - pleasant, Not in acute distress. Orientation - Oriented X3. Build & Nutrition - Well nourished and Well developed.  Head and Neck Head - normocephalic, atraumatic . Neck Global Assessment - supple, no bruit auscultated on the right, no bruit auscultated on the left.  Eye Wears glasses Pupil - Bilateral - PERR Motion - Bilateral - EOMI.  Chest and Lung Exam Auscultation Breath sounds - clear at anterior chest wall and clear at posterior chest wall. Adventitious sounds - No Adventitious sounds.  Cardiovascular Auscultation Rhythm - Regular rate and rhythm. Heart Sounds - S1 WNL and S2 WNL.  Murmurs & Other Heart Sounds - Auscultation of the heart reveals - No Murmurs.  Abdomen Palpation/Percussion Tenderness - Abdomen is non-tender to palpation. Abdomen is soft. Auscultation Auscultation  of the abdomen reveals - Bowel sounds normal.  Genitourinary Note: Not done, not pertinent to present illness  Musculoskeletal Examination of the right hip shows flexion to 120 rotation in 30 abduction 40 and external rotation of 40. There is no tenderness over the greater trochanter. There is no pain on provocative testing of the hip.Evaluation of the left hip shows flexion to 120 rotation in 30 out 40 and abduction 40 without discomfort. There is no tenderness over the greater trochanter. There is no pain on provocative testing of the hip. Her LEFT knee shows no effusion. Range of motion is 5-125. There is moderate crepitus on range of motion. She has slight tenderness medial greater than lateral with no instability. RIGHT knee shows no effusion. Range of motion 10-110. There is marked crepitus on range of motion. She is tender medial greater than lateral with no instability. Pulses, sensation and motor are intact. Gait pattern is antalgic on the RIGHT.  Radiographs AP and lateral of the knees show bone-on-bone arthritis in the medial and patellofemoral compartments of the RIGHT knee. She also has arthritic change on the LEFT but not quite as bad. Procedure Documentation None recorded.   Assessment / Plan 1. Osteoarthritis of right knee joint M17.11: Unilateral primary osteoarthritis, right knee  Patient Instructions Surgical Plans: Right Total Knee Replacement Disposition: Home PCP: Dr. Etter Sjogren- [place] - cleared to proceed with surgery. Cards: Dr. Radford Pax / Richardson Dopp, PA-C - "...no further cardiovascular testing needed. The patient may proceed to surgery at acceptable risk." IV TXA Anesthesia Issues:None Patient was instructed on what medications to stop prior to surgery. - Follow up visit in 2 weeks with Dr. Wynelle Link - Begin physical therapy following surgery - Pre-operative lab work as pre Pre-Surgical Testing - Prescriptions will be provided in hospital at time of  discharge  Return to [place], MD for [place] at [facility] on 09/24/2017 at 03:45 PM  Encounter signed-off by Mickel Crow, PA-C

## 2017-08-29 NOTE — Patient Instructions (Addendum)
Joan [name]  08/29/2017   Your procedure is scheduled on: Monday 09/09/2017  Report to [facility] Main  Entrance   Report to admitting at  0905  AM   Call this number if you have problems the morning of surgery [phone]    Remember: Do not eat food or drink liquids :After Midnight.     Take these medicines the morning of surgery with A SIP OF WATER: Levothyroxine (Synthroid), Carisoprodol (Soma) , use Hydrocodone-acetaminophen if needed                                 You may not have any metal on your body including hair pins and              piercings  Do not wear jewelry, make-up, lotions, powders or perfumes, deodorant             Do not wear nail polish.  Do not shave  48 hours prior to surgery.              Men may shave face and neck.   Do not bring valuables to the hospital. [place].  Contacts, dentures or bridgework may not be worn into surgery.  Leave suitcase in the car. After surgery it may be brought to your room.                  Please read over the following fact sheets you were given: _____________________________________________________________________             [facility] - Preparing for Surgery Before surgery, you can play an important role.  Because skin is not sterile, your skin needs to be as free of germs as possible.  You can reduce the number of germs on your skin by washing with CHG (chlorahexidine gluconate) soap before surgery.  CHG is an antiseptic cleaner which kills germs and bonds with the skin to continue killing germs even after washing. Please DO NOT use if you have an allergy to CHG or antibacterial soaps.  If your skin becomes reddened/irritated stop using the CHG and inform your nurse when you arrive at Short Stay. Do not shave (including legs and underarms) for at least 48 hours prior to the first CHG shower.  You may shave your face/neck. Please  follow these instructions carefully:  1.  Shower with CHG Soap the night before surgery and the  morning of Surgery.  2.  If you choose to wash your hair, wash your hair first as usual with your  normal  shampoo.  3.  After you shampoo, rinse your hair and body thoroughly to remove the  shampoo.                           4.  Use CHG as you would any other liquid soap.  You can apply chg directly  to the skin and wash                       Gently with a scrungie or clean washcloth.  5.  Apply the CHG Soap to your body ONLY FROM THE NECK DOWN.   Do not  use on face/ open                           Wound or open sores. Avoid contact with eyes, ears mouth and genitals (private parts).                       Wash face,  Genitals (private parts) with your normal soap.             6.  Wash thoroughly, paying special attention to the area where your surgery  will be performed.  7.  Thoroughly rinse your body with warm water from the neck down.  8.  DO NOT shower/wash with your normal soap after using and rinsing off  the CHG Soap.                9.  Pat yourself dry with a clean towel.            10.  Wear clean pajamas.            11.  Place clean sheets on your bed the night of your first shower and do not  sleep with pets. Day of Surgery : Do not apply any lotions/deodorants the morning of surgery.  Please wear clean clothes to the hospital/surgery center.  FAILURE TO FOLLOW THESE INSTRUCTIONS MAY RESULT IN THE CANCELLATION OF YOUR SURGERY PATIENT SIGNATURE_________________________________  NURSE SIGNATURE__________________________________  ________________________________________________________________________   Adam Phenix  An incentive spirometer is a tool that can help keep your lungs clear and active. This tool measures how well you are filling your lungs with each breath. Taking long deep breaths may help reverse or decrease the chance of developing breathing (pulmonary) problems  (especially infection) following:  A long period of time when you are unable to move or be active. BEFORE THE PROCEDURE   If the spirometer includes an indicator to show your best effort, your nurse or respiratory therapist will set it to a desired goal.  If possible, sit up straight or lean slightly forward. Try not to slouch.  Hold the incentive spirometer in an upright position. INSTRUCTIONS FOR USE  1. Sit on the edge of your bed if possible, or sit up as far as you can in bed or on a chair. 2. Hold the incentive spirometer in an upright position. 3. Breathe out normally. 4. Place the mouthpiece in your mouth and seal your lips tightly around it. 5. Breathe in slowly and as deeply as possible, raising the piston or the ball toward the top of the column. 6. Hold your breath for 3-5 seconds or for as long as possible. Allow the piston or ball to fall to the bottom of the column. 7. Remove the mouthpiece from your mouth and breathe out normally. 8. Rest for a few seconds and repeat Steps 1 through 7 at least 10 times every 1-2 hours when you are awake. Take your time and take a few normal breaths between deep breaths. 9. The spirometer may include an indicator to show your best effort. Use the indicator as a goal to work toward during each repetition. 10. After each set of 10 deep breaths, practice coughing to be sure your lungs are clear. If you have an incision (the cut made at the time of surgery), support your incision when coughing by placing a pillow or rolled up towels firmly against it. Once you are able to get out of bed, walk  around indoors and cough well. You may stop using the incentive spirometer when instructed by your caregiver.  RISKS AND COMPLICATIONS  Take your time so you do not get dizzy or light-headed.  If you are in pain, you may need to take or ask for pain medication before doing incentive spirometry. It is harder to take a deep breath if you are having  pain. AFTER USE  Rest and breathe slowly and easily.  It can be helpful to keep track of a log of your progress. Your caregiver can provide you with a simple table to help with this. If you are using the spirometer at home, follow these instructions: [place] IF:   You are having difficultly using the spirometer.  You have trouble using the spirometer as often as instructed.  Your pain medication is not giving enough relief while using the spirometer.  You develop fever of 100.5 F (38.1 C) or higher. SEEK IMMEDIATE MEDICAL CARE IF:   You cough up bloody sputum that had not been present before.  You develop fever of 102 F (38.9 C) or greater.  You develop worsening pain at or near the incision site. MAKE SURE YOU:   Understand these instructions.  Will watch your condition.  Will get help right away if you are not doing well or get worse. Document Released: 11/05/2006 Document Revised: 09/17/2011 Document Reviewed: 01/06/2007 Exit[employer] Patient Information 2014 Exit[employer], [place].   ________________________________________________________________________  WHAT IS A BLOOD TRANSFUSION? Blood Transfusion Information  A transfusion is the replacement of blood or some of its parts. Blood is made up of multiple cells which provide different functions.  Red blood cells carry oxygen and are used for blood loss replacement.  White blood cells fight against infection.  Platelets control bleeding.  Plasma helps clot blood.  Other blood products are available for specialized needs, such as hemophilia or other clotting disorders. BEFORE THE TRANSFUSION  Who gives blood for transfusions?   Healthy volunteers who are fully evaluated to make sure their blood is safe. This is blood bank blood. Transfusion therapy is the safest it has ever been in the practice of medicine. Before blood is taken from a donor, a complete history is taken to make sure that person has no history  of diseases nor engages in risky social behavior (examples are intravenous drug use or sexual activity with multiple partners). The donor's travel history is screened to minimize risk of transmitting infections, such as malaria. The donated blood is tested for signs of infectious diseases, such as HIV and hepatitis. The blood is then tested to be sure it is compatible with you in order to minimize the chance of a transfusion reaction. If you or a relative donates blood, this is often done in anticipation of surgery and is not appropriate for emergency situations. It takes many days to process the donated blood. RISKS AND COMPLICATIONS Although transfusion therapy is very safe and saves many lives, the main dangers of transfusion include:   Getting an infectious disease.  Developing a transfusion reaction. This is an allergic reaction to something in the blood you were given. Every precaution is taken to prevent this. The decision to have a blood transfusion has been considered carefully by your caregiver before blood is given. Blood is not given unless the benefits outweigh the risks. AFTER THE TRANSFUSION  Right after receiving a blood transfusion, you will usually feel much better and more energetic. This is especially true if your red blood cells have  gotten low (anemic). The transfusion raises the level of the red blood cells which carry oxygen, and this usually causes an energy increase.  The nurse administering the transfusion will monitor you carefully for complications. HOME CARE INSTRUCTIONS  No special instructions are needed after a transfusion. You may find your energy is better. Speak with your caregiver about any limitations on activity for underlying diseases you may have. SEEK MEDICAL CARE IF:   Your condition is not improving after your transfusion.  You develop redness or irritation at the intravenous (IV) site. SEEK IMMEDIATE MEDICAL CARE IF:  Any of the following symptoms  occur over the next 12 hours:  Shaking chills.  You have a temperature by mouth above 102 F (38.9 C), not controlled by medicine.  Chest, back, or muscle pain.  People around you feel you are not acting correctly or are confused.  Shortness of breath or difficulty breathing.  Dizziness and fainting.  You get a rash or develop hives.  You have a decrease in urine output.  Your urine turns a dark color or changes to pink, red, or brown. Any of the following symptoms occur over the next 10 days:  You have a temperature by mouth above 102 F (38.9 C), not controlled by medicine.  Shortness of breath.  Weakness after normal activity.  The white part of the eye turns yellow (jaundice).  You have a decrease in the amount of urine or are urinating less often.  Your urine turns a dark color or changes to pink, red, or brown. Document Released: 06/22/2000 Document Revised: 09/17/2011 Document Reviewed: 02/09/2008 [facility] Patient Information 2014 [place], [place].  _______________________________________________________________________

## 2017-09-03 ENCOUNTER — Other Ambulatory Visit: Payer: Self-pay

## 2017-09-03 ENCOUNTER — Encounter (HOSPITAL_COMMUNITY)
Admission: RE | Admit: 2017-09-03 | Discharge: 2017-09-03 | Disposition: A | Discharge status: Home / Self Care | Payer: Medicare Other | Source: Ambulatory Visit | Attending: Orthopedic Surgery | Admitting: Orthopedic Surgery

## 2017-09-03 ENCOUNTER — Encounter (HOSPITAL_COMMUNITY): Payer: Self-pay

## 2017-09-03 DIAGNOSIS — M1711 Unilateral primary osteoarthritis, right knee: Secondary | ICD-10-CM | POA: Diagnosis not present

## 2017-09-03 DIAGNOSIS — Z0183 Encounter for blood typing: Secondary | ICD-10-CM | POA: Diagnosis not present

## 2017-09-03 DIAGNOSIS — Z01812 Encounter for preprocedural laboratory examination: Secondary | ICD-10-CM | POA: Insufficient documentation

## 2017-09-03 LAB — COMPREHENSIVE METABOLIC PANEL
ALT: 12 U/L — ABNORMAL LOW (ref 14–54)
AST: 21 U/L (ref 15–41)
Albumin: 4.3 g/dL (ref 3.5–5.0)
Alkaline Phosphatase: 44 U/L (ref 38–126)
Anion gap: 8 (ref 5–15)
BUN: 27 mg/dL — ABNORMAL HIGH (ref 6–20)
CO2: 30 mmol/L (ref 22–32)
Calcium: 9 mg/dL (ref 8.9–10.3)
Chloride: 104 mmol/L (ref 101–111)
Creatinine, Ser: 1.07 mg/dL — ABNORMAL HIGH (ref 0.44–1.00)
GFR calc Af Amer: 59 mL/min — ABNORMAL LOW (ref >60)
GFR calc non Af Amer: 51 mL/min — ABNORMAL LOW (ref >60)
Glucose, Bld: 97 mg/dL (ref 65–99)
Potassium: 4.1 mmol/L (ref 3.5–5.1)
Sodium: 142 mmol/L (ref 135–145)
Total Bilirubin: 0.6 mg/dL (ref 0.3–1.2)
Total Protein: 7 g/dL (ref 6.5–8.1)

## 2017-09-03 LAB — SURGICAL PCR SCREEN
MRSA, PCR: NEGATIVE
Staphylococcus aureus: POSITIVE — AB

## 2017-09-03 LAB — PROTIME-INR
INR: 0.97
Prothrombin Time: 12.8 seconds (ref 11.4–15.2)

## 2017-09-03 LAB — ABO/RH: ABO/RH(D): A POS

## 2017-09-03 LAB — CBC
HCT: 37 % (ref 36.0–46.0)
Hemoglobin: 12.2 g/dL (ref 12.0–15.0)
MCH: 30.9 pg (ref 26.0–34.0)
MCHC: 33 g/dL (ref 30.0–36.0)
MCV: 93.7 fL (ref 78.0–100.0)
Platelets: 228 10*3/uL (ref 150–400)
RBC: 3.95 MIL/uL (ref 3.87–5.11)
RDW: 13.7 % (ref 11.5–15.5)
WBC: 4.7 10*3/uL (ref 4.0–10.5)

## 2017-09-03 LAB — APTT: aPTT: 30 seconds (ref 24–36)

## 2017-09-03 NOTE — Progress Notes (Signed)
07/01/2017- Clearance from Dr. Carollee Herter on chart.   07/19/2017-Cardiac Clearance from Dr. Fransico Him on chart

## 2017-09-06 ENCOUNTER — Other Ambulatory Visit: Payer: Self-pay | Admitting: Family Medicine

## 2017-09-06 DIAGNOSIS — M961 Postlaminectomy syndrome, not elsewhere classified: Secondary | ICD-10-CM | POA: Diagnosis not present

## 2017-09-06 DIAGNOSIS — Z79891 Long term (current) use of opiate analgesic: Secondary | ICD-10-CM | POA: Diagnosis not present

## 2017-09-06 DIAGNOSIS — M62830 Muscle spasm of back: Secondary | ICD-10-CM | POA: Diagnosis not present

## 2017-09-06 DIAGNOSIS — G894 Chronic pain syndrome: Secondary | ICD-10-CM | POA: Diagnosis not present

## 2017-09-06 DIAGNOSIS — E785 Hyperlipidemia, unspecified: Secondary | ICD-10-CM

## 2017-09-08 MED ORDER — BUPIVACAINE LIPOSOME 1.3 % IJ SUSP
20.0000 mL | Freq: Once | INTRAMUSCULAR | Status: DC
  Filled 2017-09-08: qty 20

## 2017-09-08 MED ORDER — TRANEXAMIC ACID 1000 MG/10ML IV SOLN
1000.0000 mg | INTRAVENOUS | Status: AC
  Administered 2017-09-09: 1000 mg via INTRAVENOUS
  Filled 2017-09-08: qty 1100

## 2017-09-09 ENCOUNTER — Telehealth (HOSPITAL_COMMUNITY): Payer: Self-pay | Admitting: *Deleted

## 2017-09-09 ENCOUNTER — Other Ambulatory Visit: Payer: Self-pay

## 2017-09-09 ENCOUNTER — Encounter (HOSPITAL_COMMUNITY)
Admission: RE | Disposition: A | Discharge status: Home Health | Payer: Self-pay | Source: Ambulatory Visit | Attending: Orthopedic Surgery

## 2017-09-09 ENCOUNTER — Inpatient Hospital Stay (HOSPITAL_COMMUNITY): Payer: Medicare Other | Admitting: Anesthesiology

## 2017-09-09 ENCOUNTER — Encounter (HOSPITAL_COMMUNITY): Payer: Self-pay

## 2017-09-09 ENCOUNTER — Inpatient Hospital Stay (HOSPITAL_COMMUNITY)
Admission: RE | Admit: 2017-09-09 | Discharge: 2017-09-11 | DRG: 470 | Disposition: A | Discharge status: Home Health | Payer: Medicare Other | Source: Ambulatory Visit | Attending: Orthopedic Surgery | Admitting: Orthopedic Surgery

## 2017-09-09 DIAGNOSIS — H409 Unspecified glaucoma: Secondary | ICD-10-CM | POA: Diagnosis present

## 2017-09-09 DIAGNOSIS — M19012 Primary osteoarthritis, left shoulder: Secondary | ICD-10-CM | POA: Diagnosis present

## 2017-09-09 DIAGNOSIS — M25761 Osteophyte, right knee: Secondary | ICD-10-CM | POA: Diagnosis not present

## 2017-09-09 DIAGNOSIS — Z885 Allergy status to narcotic agent status: Secondary | ICD-10-CM

## 2017-09-09 DIAGNOSIS — G8918 Other acute postprocedural pain: Secondary | ICD-10-CM | POA: Diagnosis not present

## 2017-09-09 DIAGNOSIS — M1710 Unilateral primary osteoarthritis, unspecified knee: Secondary | ICD-10-CM

## 2017-09-09 DIAGNOSIS — R269 Unspecified abnormalities of gait and mobility: Secondary | ICD-10-CM | POA: Diagnosis not present

## 2017-09-09 DIAGNOSIS — I10 Essential (primary) hypertension: Secondary | ICD-10-CM | POA: Diagnosis present

## 2017-09-09 DIAGNOSIS — K219 Gastro-esophageal reflux disease without esophagitis: Secondary | ICD-10-CM | POA: Diagnosis present

## 2017-09-09 DIAGNOSIS — Z8261 Family history of arthritis: Secondary | ICD-10-CM | POA: Diagnosis not present

## 2017-09-09 DIAGNOSIS — M170 Bilateral primary osteoarthritis of knee: Secondary | ICD-10-CM | POA: Diagnosis not present

## 2017-09-09 DIAGNOSIS — Z8249 Family history of ischemic heart disease and other diseases of the circulatory system: Secondary | ICD-10-CM

## 2017-09-09 DIAGNOSIS — E039 Hypothyroidism, unspecified: Secondary | ICD-10-CM | POA: Diagnosis not present

## 2017-09-09 DIAGNOSIS — M545 Low back pain: Secondary | ICD-10-CM | POA: Diagnosis not present

## 2017-09-09 DIAGNOSIS — M19011 Primary osteoarthritis, right shoulder: Secondary | ICD-10-CM | POA: Diagnosis present

## 2017-09-09 DIAGNOSIS — Z888 Allergy status to other drugs, medicaments and biological substances status: Secondary | ICD-10-CM

## 2017-09-09 DIAGNOSIS — Z79899 Other long term (current) drug therapy: Secondary | ICD-10-CM

## 2017-09-09 DIAGNOSIS — K5900 Constipation, unspecified: Secondary | ICD-10-CM | POA: Diagnosis present

## 2017-09-09 DIAGNOSIS — Z800 Family history of malignant neoplasm of digestive organs: Secondary | ICD-10-CM

## 2017-09-09 DIAGNOSIS — Z9049 Acquired absence of other specified parts of digestive tract: Secondary | ICD-10-CM

## 2017-09-09 DIAGNOSIS — M1711 Unilateral primary osteoarthritis, right knee: Secondary | ICD-10-CM | POA: Diagnosis not present

## 2017-09-09 DIAGNOSIS — E890 Postprocedural hypothyroidism: Secondary | ICD-10-CM | POA: Diagnosis present

## 2017-09-09 DIAGNOSIS — F17210 Nicotine dependence, cigarettes, uncomplicated: Secondary | ICD-10-CM | POA: Diagnosis present

## 2017-09-09 DIAGNOSIS — Z79890 Hormone replacement therapy (postmenopausal): Secondary | ICD-10-CM | POA: Diagnosis not present

## 2017-09-09 DIAGNOSIS — M19039 Primary osteoarthritis, unspecified wrist: Secondary | ICD-10-CM | POA: Diagnosis present

## 2017-09-09 DIAGNOSIS — Z973 Presence of spectacles and contact lenses: Secondary | ICD-10-CM | POA: Diagnosis not present

## 2017-09-09 DIAGNOSIS — E785 Hyperlipidemia, unspecified: Secondary | ICD-10-CM | POA: Diagnosis present

## 2017-09-09 DIAGNOSIS — Z90710 Acquired absence of both cervix and uterus: Secondary | ICD-10-CM

## 2017-09-09 DIAGNOSIS — G8929 Other chronic pain: Secondary | ICD-10-CM | POA: Diagnosis not present

## 2017-09-09 DIAGNOSIS — M179 Osteoarthritis of knee, unspecified: Secondary | ICD-10-CM

## 2017-09-09 DIAGNOSIS — Z86010 Personal history of colonic polyps: Secondary | ICD-10-CM | POA: Diagnosis not present

## 2017-09-09 DIAGNOSIS — Z808 Family history of malignant neoplasm of other organs or systems: Secondary | ICD-10-CM | POA: Diagnosis not present

## 2017-09-09 HISTORY — PX: TOTAL KNEE ARTHROPLASTY: SHX125

## 2017-09-09 LAB — TYPE AND SCREEN
ABO/RH(D): A POS
Antibody Screen: NEGATIVE

## 2017-09-09 SURGERY — ARTHROPLASTY, KNEE, TOTAL
Anesthesia: Spinal | Site: Knee | Laterality: Right

## 2017-09-09 MED ORDER — ACETAMINOPHEN 10 MG/ML IV SOLN
1000.0000 mg | Freq: Once | INTRAVENOUS | Status: AC
  Administered 2017-09-09: 1000 mg via INTRAVENOUS
  Filled 2017-09-09: qty 100

## 2017-09-09 MED ORDER — SODIUM CHLORIDE 0.9 % IR SOLN
Status: DC | PRN
  Administered 2017-09-09: 1000 mL

## 2017-09-09 MED ORDER — PROPOFOL 10 MG/ML IV BOLUS
INTRAVENOUS | Status: AC
  Filled 2017-09-09: qty 40

## 2017-09-09 MED ORDER — NICOTINE 7 MG/24HR TD PT24
7.0000 mg | MEDICATED_PATCH | Freq: Every day | TRANSDERMAL | Status: DC
  Administered 2017-09-10 – 2017-09-11 (×3): 7 mg via TRANSDERMAL
  Filled 2017-09-09 (×3): qty 1

## 2017-09-09 MED ORDER — ROPIVACAINE HCL 7.5 MG/ML IJ SOLN
INTRAMUSCULAR | Status: DC | PRN
  Administered 2017-09-09 (×6): 5 mL via PERINEURAL

## 2017-09-09 MED ORDER — BISACODYL 10 MG RE SUPP: 10.0000 mg | Freq: Every day | RECTAL | Status: DC | PRN

## 2017-09-09 MED ORDER — OXYCODONE HCL 5 MG PO TABS
15.0000 mg | ORAL_TABLET | ORAL | Status: DC | PRN
  Administered 2017-09-09 – 2017-09-11 (×13): 15 mg via ORAL
  Filled 2017-09-09 (×14): qty 3

## 2017-09-09 MED ORDER — SODIUM CHLORIDE 0.9 % IJ SOLN
INTRAMUSCULAR | Status: AC
  Filled 2017-09-09: qty 50

## 2017-09-09 MED ORDER — CEFAZOLIN SODIUM-DEXTROSE 2-4 GM/100ML-% IV SOLN
2.0000 g | Freq: Four times a day (QID) | INTRAVENOUS | Status: AC
  Administered 2017-09-09 (×2): 2 g via INTRAVENOUS
  Filled 2017-09-09 (×2): qty 100

## 2017-09-09 MED ORDER — DIPHENHYDRAMINE HCL 12.5 MG/5ML PO ELIX: 12.5000 mg | ORAL_SOLUTION | ORAL | Status: DC | PRN

## 2017-09-09 MED ORDER — POLYETHYLENE GLYCOL 3350 17 G PO PACK: 17.0000 g | PACK | Freq: Every day | ORAL | Status: DC | PRN

## 2017-09-09 MED ORDER — PHENOL 1.4 % MT LIQD: 1.0000 | OROMUCOSAL | Status: DC | PRN

## 2017-09-09 MED ORDER — FLEET ENEMA 7-19 GM/118ML RE ENEM: 1.0000 | ENEMA | Freq: Once | RECTAL | Status: DC | PRN

## 2017-09-09 MED ORDER — STERILE WATER FOR IRRIGATION IR SOLN
Status: DC | PRN
  Administered 2017-09-09: 2000 mL

## 2017-09-09 MED ORDER — HYDROMORPHONE HCL 2 MG PO TABS
2.0000 mg | ORAL_TABLET | ORAL | Status: DC | PRN
  Administered 2017-09-09: 13:00:00 2 mg via ORAL
  Filled 2017-09-09: qty 1

## 2017-09-09 MED ORDER — PROPOFOL 500 MG/50ML IV EMUL
INTRAVENOUS | Status: DC | PRN
  Administered 2017-09-09: 50 ug/kg/min via INTRAVENOUS

## 2017-09-09 MED ORDER — FENTANYL CITRATE (PF) 100 MCG/2ML IJ SOLN: 25.0000 ug | INTRAMUSCULAR | Status: DC | PRN

## 2017-09-09 MED ORDER — PHENYLEPHRINE HCL 10 MG/ML IJ SOLN
INTRAMUSCULAR | Status: AC
  Filled 2017-09-09: qty 1

## 2017-09-09 MED ORDER — FENTANYL CITRATE (PF) 100 MCG/2ML IJ SOLN
50.0000 ug | INTRAMUSCULAR | Status: DC
  Administered 2017-09-09: 100 ug via INTRAVENOUS

## 2017-09-09 MED ORDER — CEFAZOLIN SODIUM-DEXTROSE 2-4 GM/100ML-% IV SOLN
2.0000 g | INTRAVENOUS | Status: AC
  Administered 2017-09-09: 2 g via INTRAVENOUS
  Filled 2017-09-09: qty 100

## 2017-09-09 MED ORDER — LEVOTHYROXINE SODIUM 88 MCG PO TABS
88.0000 ug | ORAL_TABLET | Freq: Every day | ORAL | Status: DC
  Administered 2017-09-10 – 2017-09-11 (×2): 88 ug via ORAL
  Filled 2017-09-09 (×2): qty 1

## 2017-09-09 MED ORDER — PROMETHAZINE HCL 25 MG/ML IJ SOLN: 6.2500 mg | INTRAMUSCULAR | Status: DC | PRN

## 2017-09-09 MED ORDER — SIMVASTATIN 20 MG PO TABS
20.0000 mg | ORAL_TABLET | Freq: Every day | ORAL | Status: DC
  Administered 2017-09-09 – 2017-09-10 (×2): 20 mg via ORAL
  Filled 2017-09-09 (×2): qty 1

## 2017-09-09 MED ORDER — DOCUSATE SODIUM 100 MG PO CAPS
100.0000 mg | ORAL_CAPSULE | Freq: Two times a day (BID) | ORAL | Status: DC
  Administered 2017-09-10 – 2017-09-11 (×2): 100 mg via ORAL
  Filled 2017-09-09 (×2): qty 1

## 2017-09-09 MED ORDER — METOCLOPRAMIDE HCL 5 MG PO TABS: 5.0000 mg | ORAL_TABLET | Freq: Three times a day (TID) | ORAL | Status: DC | PRN

## 2017-09-09 MED ORDER — ONDANSETRON HCL 4 MG PO TABS: 4.0000 mg | ORAL_TABLET | Freq: Four times a day (QID) | ORAL | Status: DC | PRN

## 2017-09-09 MED ORDER — PROPOFOL 500 MG/50ML IV EMUL
INTRAVENOUS | Status: DC | PRN
  Administered 2017-09-09: 20 mg via INTRAVENOUS

## 2017-09-09 MED ORDER — SODIUM CHLORIDE 0.9 % IJ SOLN
INTRAMUSCULAR | Status: DC | PRN
  Administered 2017-09-09: 60 mL

## 2017-09-09 MED ORDER — METHOCARBAMOL 1000 MG/10ML IJ SOLN
500.0000 mg | Freq: Four times a day (QID) | INTRAVENOUS | Status: DC | PRN
  Administered 2017-09-09: 500 mg via INTRAVENOUS
  Filled 2017-09-09: qty 550

## 2017-09-09 MED ORDER — MENTHOL 3 MG MT LOZG: 1.0000 | LOZENGE | OROMUCOSAL | Status: DC | PRN

## 2017-09-09 MED ORDER — SODIUM CHLORIDE 0.9 % IV SOLN
INTRAVENOUS | Status: DC
  Administered 2017-09-09: 17:00:00 via INTRAVENOUS

## 2017-09-09 MED ORDER — KETOROLAC TROMETHAMINE 30 MG/ML IJ SOLN: 30.0000 mg | Freq: Once | INTRAMUSCULAR | Status: DC | PRN

## 2017-09-09 MED ORDER — DEXAMETHASONE SODIUM PHOSPHATE 10 MG/ML IJ SOLN
10.0000 mg | Freq: Once | INTRAMUSCULAR | Status: AC
  Administered 2017-09-10: 09:00:00 10 mg via INTRAVENOUS
  Filled 2017-09-09: qty 1

## 2017-09-09 MED ORDER — ACETAMINOPHEN 500 MG PO TABS
1000.0000 mg | ORAL_TABLET | Freq: Four times a day (QID) | ORAL | Status: AC
  Administered 2017-09-09 (×3): 1000 mg via ORAL
  Filled 2017-09-09 (×5): qty 2

## 2017-09-09 MED ORDER — DORZOLAMIDE HCL-TIMOLOL MAL 2-0.5 % OP SOLN
1.0000 [drp] | Freq: Two times a day (BID) | OPHTHALMIC | Status: DC
  Administered 2017-09-10 – 2017-09-11 (×4): 1 [drp] via OPHTHALMIC
  Filled 2017-09-09: qty 10

## 2017-09-09 MED ORDER — HYDROMORPHONE HCL 1 MG/ML IJ SOLN
1.0000 mg | INTRAMUSCULAR | Status: DC | PRN
  Administered 2017-09-10 (×2): 1 mg via INTRAVENOUS
  Filled 2017-09-09 (×2): qty 1

## 2017-09-09 MED ORDER — HYDROCHLOROTHIAZIDE 25 MG PO TABS
25.0000 mg | ORAL_TABLET | Freq: Every day | ORAL | Status: DC
Start: 2017-09-10 — End: 2017-09-11
  Administered 2017-09-11: 10:00:00 25 mg via ORAL
  Filled 2017-09-09: qty 1

## 2017-09-09 MED ORDER — METOCLOPRAMIDE HCL 5 MG/ML IJ SOLN: 5.0000 mg | Freq: Three times a day (TID) | INTRAMUSCULAR | Status: DC | PRN

## 2017-09-09 MED ORDER — FUROSEMIDE 20 MG PO TABS: 10.0000 mg | ORAL_TABLET | Freq: Two times a day (BID) | ORAL | Status: DC | PRN

## 2017-09-09 MED ORDER — ONDANSETRON HCL 4 MG/2ML IJ SOLN
INTRAMUSCULAR | Status: DC | PRN
  Administered 2017-09-09: 4 mg via INTRAVENOUS

## 2017-09-09 MED ORDER — FENTANYL CITRATE (PF) 100 MCG/2ML IJ SOLN
INTRAMUSCULAR | Status: AC
  Filled 2017-09-09: qty 2

## 2017-09-09 MED ORDER — HYDROMORPHONE HCL 1 MG/ML IJ SOLN
0.5000 mg | INTRAMUSCULAR | Status: DC | PRN
  Administered 2017-09-09: 19:00:00 0.5 mg via INTRAVENOUS
  Filled 2017-09-09: qty 0.5

## 2017-09-09 MED ORDER — MIDAZOLAM HCL 2 MG/2ML IJ SOLN
INTRAMUSCULAR | Status: AC
  Filled 2017-09-09: qty 2

## 2017-09-09 MED ORDER — SODIUM CHLORIDE 0.9 % IJ SOLN
INTRAMUSCULAR | Status: AC
  Filled 2017-09-09: qty 10

## 2017-09-09 MED ORDER — PHENYLEPHRINE 40 MCG/ML (10ML) SYRINGE FOR IV PUSH (FOR BLOOD PRESSURE SUPPORT)
PREFILLED_SYRINGE | INTRAVENOUS | Status: AC
  Filled 2017-09-09: qty 10

## 2017-09-09 MED ORDER — HYDROMORPHONE HCL 2 MG PO TABS
4.0000 mg | ORAL_TABLET | ORAL | Status: DC | PRN
  Administered 2017-09-09: 17:00:00 4 mg via ORAL
  Filled 2017-09-09: qty 2

## 2017-09-09 MED ORDER — BISACODYL 5 MG PO TBEC
5.0000 mg | DELAYED_RELEASE_TABLET | Freq: Every day | ORAL | Status: DC | PRN
  Administered 2017-09-09 – 2017-09-10 (×2): 5 mg via ORAL
  Filled 2017-09-09 (×2): qty 1

## 2017-09-09 MED ORDER — CHLORHEXIDINE GLUCONATE 4 % EX LIQD: 60.0000 mL | Freq: Once | CUTANEOUS | Status: DC

## 2017-09-09 MED ORDER — ONDANSETRON HCL 4 MG/2ML IJ SOLN: 4.0000 mg | Freq: Four times a day (QID) | INTRAMUSCULAR | Status: DC | PRN

## 2017-09-09 MED ORDER — BUPIVACAINE IN DEXTROSE 0.75-8.25 % IT SOLN
INTRATHECAL | Status: DC | PRN
  Administered 2017-09-09: 1.6 mL via INTRATHECAL

## 2017-09-09 MED ORDER — DEXAMETHASONE SODIUM PHOSPHATE 10 MG/ML IJ SOLN
10.0000 mg | Freq: Once | INTRAMUSCULAR | Status: AC
  Administered 2017-09-09: 10 mg via INTRAVENOUS

## 2017-09-09 MED ORDER — SODIUM CHLORIDE 0.9 % IV SOLN
1000.0000 mg | Freq: Once | INTRAVENOUS | Status: AC
  Administered 2017-09-09: 16:00:00 1000 mg via INTRAVENOUS
  Filled 2017-09-09: qty 1100

## 2017-09-09 MED ORDER — RIVAROXABAN 10 MG PO TABS
10.0000 mg | ORAL_TABLET | Freq: Every day | ORAL | Status: DC
  Administered 2017-09-10 – 2017-09-11 (×2): 10 mg via ORAL
  Filled 2017-09-09 (×2): qty 1

## 2017-09-09 MED ORDER — PHENYLEPHRINE HCL 10 MG/ML IJ SOLN
INTRAMUSCULAR | Status: DC | PRN
  Administered 2017-09-09 (×3): 80 ug via INTRAVENOUS

## 2017-09-09 MED ORDER — MEPERIDINE HCL 50 MG/ML IJ SOLN: 6.2500 mg | INTRAMUSCULAR | Status: DC | PRN

## 2017-09-09 MED ORDER — METHOCARBAMOL 500 MG PO TABS
500.0000 mg | ORAL_TABLET | Freq: Four times a day (QID) | ORAL | Status: DC | PRN
  Administered 2017-09-10 – 2017-09-11 (×5): 500 mg via ORAL
  Filled 2017-09-09 (×5): qty 1

## 2017-09-09 MED ORDER — BUPIVACAINE LIPOSOME 1.3 % IJ SUSP
INTRAMUSCULAR | Status: DC | PRN
  Administered 2017-09-09: 20 mL

## 2017-09-09 MED ORDER — MIDAZOLAM HCL 2 MG/2ML IJ SOLN
1.0000 mg | INTRAMUSCULAR | Status: DC
  Administered 2017-09-09: 2 mg via INTRAVENOUS

## 2017-09-09 MED ORDER — DEXTROSE 5 % IV SOLN
INTRAVENOUS | Status: DC | PRN
  Administered 2017-09-09: 50 ug/min via INTRAVENOUS

## 2017-09-09 MED ORDER — CARISOPRODOL 350 MG PO TABS
175.0000 mg | ORAL_TABLET | Freq: Three times a day (TID) | ORAL | Status: DC
  Administered 2017-09-09 – 2017-09-11 (×6): 175 mg via ORAL
  Filled 2017-09-09 (×6): qty 1

## 2017-09-09 MED ORDER — LACTATED RINGERS IV SOLN
INTRAVENOUS | Status: DC
  Administered 2017-09-09 (×2): via INTRAVENOUS

## 2017-09-09 MED ORDER — PROPOFOL 10 MG/ML IV BOLUS
INTRAVENOUS | Status: AC
  Filled 2017-09-09: qty 20

## 2017-09-09 MED ORDER — TRAZODONE HCL 50 MG PO TABS
50.0000 mg | ORAL_TABLET | Freq: Every day | ORAL | Status: DC
  Administered 2017-09-09 – 2017-09-10 (×2): 50 mg via ORAL
  Filled 2017-09-09 (×2): qty 1

## 2017-09-09 SURGICAL SUPPLY — 49 items
BAG DECANTER FOR FLEXI CONT (MISCELLANEOUS) IMPLANT
BAG ZIPLOCK 12X15 (MISCELLANEOUS) ×3 IMPLANT
BANDAGE ACE 6X5 VEL STRL LF (GAUZE/BANDAGES/DRESSINGS) ×3 IMPLANT
BLADE SAG 18X100X1.27 (BLADE) ×3 IMPLANT
BLADE SAW SGTL 11.0X1.19X90.0M (BLADE) ×3 IMPLANT
BOWL SMART MIX CTS (DISPOSABLE) ×3 IMPLANT
CAP KNEE TOTAL 3 SIGMA ×3 IMPLANT
CEMENT HV SMART SET (Cement) ×6 IMPLANT
CLOSURE WOUND 1/2 X4 (GAUZE/BANDAGES/DRESSINGS) ×1
COVER SURGICAL LIGHT HANDLE (MISCELLANEOUS) ×3 IMPLANT
CUFF TOURN SGL QUICK 34 (TOURNIQUET CUFF) ×2
CUFF TRNQT CYL 34X4X40X1 (TOURNIQUET CUFF) ×1 IMPLANT
DECANTER SPIKE VIAL GLASS SM (MISCELLANEOUS) ×6 IMPLANT
DRAPE TOP 10253 STERILE (DRAPES) IMPLANT
DRAPE U-SHAPE 47X51 STRL (DRAPES) ×3 IMPLANT
DRSG ADAPTIC 3X8 NADH LF (GAUZE/BANDAGES/DRESSINGS) ×3 IMPLANT
DRSG PAD ABDOMINAL 8X10 ST (GAUZE/BANDAGES/DRESSINGS) ×3 IMPLANT
DURAPREP 26ML APPLICATOR (WOUND CARE) ×3 IMPLANT
ELECT REM PT RETURN 15FT ADLT (MISCELLANEOUS) ×3 IMPLANT
EVACUATOR 1/8 PVC DRAIN (DRAIN) ×3 IMPLANT
GAUZE SPONGE 4X4 12PLY STRL (GAUZE/BANDAGES/DRESSINGS) ×3 IMPLANT
GLOVE BIO SURGEON STRL SZ7.5 (GLOVE) ×3 IMPLANT
GLOVE BIO SURGEON STRL SZ8 (GLOVE) ×6 IMPLANT
GLOVE BIOGEL PI IND STRL 6.5 (GLOVE) ×2 IMPLANT
GLOVE BIOGEL PI IND STRL 8 (GLOVE) ×1 IMPLANT
GLOVE BIOGEL PI INDICATOR 6.5 (GLOVE) ×4
GLOVE BIOGEL PI INDICATOR 8 (GLOVE) ×2
GLOVE SURG SS PI 6.5 STRL IVOR (GLOVE) IMPLANT
GOWN STRL REUS W/TWL LRG LVL3 (GOWN DISPOSABLE) ×9 IMPLANT
GOWN STRL REUS W/TWL XL LVL3 (GOWN DISPOSABLE) ×3 IMPLANT
HANDPIECE INTERPULSE COAX TIP (DISPOSABLE) ×2
IMMOBILIZER KNEE 20 (SOFTGOODS) ×3
IMMOBILIZER KNEE 20 THIGH 36 (SOFTGOODS) ×1 IMPLANT
MANIFOLD NEPTUNE II (INSTRUMENTS) ×3 IMPLANT
PACK TOTAL KNEE CUSTOM (KITS) ×3 IMPLANT
PAD ABD 8X10 STRL (GAUZE/BANDAGES/DRESSINGS) ×3 IMPLANT
PADDING CAST COTTON 6X4 STRL (CAST SUPPLIES) ×3 IMPLANT
POSITIONER SURGICAL ARM (MISCELLANEOUS) ×3 IMPLANT
SET HNDPC FAN SPRY TIP SCT (DISPOSABLE) ×1 IMPLANT
STRIP CLOSURE SKIN 1/2X4 (GAUZE/BANDAGES/DRESSINGS) ×2 IMPLANT
SUT MNCRL AB 4-0 PS2 18 (SUTURE) ×3 IMPLANT
SUT STRATAFIX 0 PDS 27 VIOLET (SUTURE) ×3
SUT VIC AB 2-0 CT1 27 (SUTURE) ×6
SUT VIC AB 2-0 CT1 TAPERPNT 27 (SUTURE) ×3 IMPLANT
SUTURE STRATFX 0 PDS 27 VIOLET (SUTURE) ×1 IMPLANT
SYR 30ML LL (SYRINGE) ×6 IMPLANT
TRAY FOLEY CATH 14FRSI W/METER (CATHETERS) ×3 IMPLANT
WRAP KNEE MAXI GEL POST OP (GAUZE/BANDAGES/DRESSINGS) ×3 IMPLANT
YANKAUER SUCT BULB TIP 10FT TU (MISCELLANEOUS) ×3 IMPLANT

## 2017-09-09 NOTE — Discharge Instructions (Addendum)
° °Dr. Frank Aluisio °Total Joint Specialist °[facility] °[address] °[place], [place] [address] °[phone] ° °TOTAL KNEE REPLACEMENT POSTOPERATIVE DIRECTIONS ° °Knee Rehabilitation, Guidelines Following Surgery  °Results after knee surgery are often greatly improved when you follow the exercise, range of motion and muscle strengthening exercises prescribed by your doctor. Safety measures are also important to protect the knee from further injury. Any time any of these exercises cause you to have increased pain or swelling in your knee joint, decrease the amount until you are comfortable again and slowly increase them. If you have problems or questions, call your caregiver or physical therapist for advice.  ° °HOME CARE INSTRUCTIONS  °Remove items at home which could result in a fall. This includes throw rugs or furniture in walking pathways.  °· ICE to the affected knee every three hours for 30 minutes at a time and then as needed for pain and swelling.  Continue to use ice on the knee for pain and swelling from surgery. You may notice swelling that will progress down to the foot and ankle.  This is normal after surgery.  Elevate the leg when you are not up walking on it.   °· Continue to use the breathing machine which will help keep your temperature down.  It is common for your temperature to cycle up and down following surgery, especially at night when you are not up moving around and exerting yourself.  The breathing machine keeps your lungs expanded and your temperature down. °· Do not place pillow under knee, focus on keeping the knee straight while resting ° °DIET °You may resume your previous home diet once your are discharged from the hospital. ° °DRESSING / WOUND CARE / SHOWERING °You may shower 3 days after surgery, but keep the wounds dry during showering.  You may use an occlusive plastic wrap (Press'n Seal for example), NO SOAKING/SUBMERGING IN THE BATHTUB.  If the bandage gets  wet, change with a clean dry gauze.  If the incision gets wet, pat the wound dry with a clean towel. °You may start showering once you are discharged home but do not submerge the incision under water. Just pat the incision dry and apply a dry gauze dressing on daily. °Change the surgical dressing daily and reapply a dry dressing each time. ° °ACTIVITY °Walk with your walker as instructed. °Use walker as long as suggested by your caregivers. °Avoid periods of inactivity such as sitting longer than an hour when not asleep. This helps prevent blood clots.  °You may resume a sexual relationship in one month or when given the OK by your doctor.  °You may return to work once you are cleared by your doctor.  °Do not drive a car for 6 weeks or until released by you surgeon.  °Do not drive while taking narcotics. ° °WEIGHT BEARING °Weight bearing as tolerated with assist device (walker, cane, etc) as directed, use it as long as suggested by your surgeon or therapist, typically at least 4-6 weeks. ° °POSTOPERATIVE CONSTIPATION PROTOCOL °Constipation - defined medically as fewer than three stools per week and severe constipation as less than one stool per week. ° °One of the most common issues patients have following surgery is constipation.  Even if you have a regular bowel pattern at home, your normal regimen is likely to be disrupted due to multiple reasons following surgery.  Combination of anesthesia, postoperative narcotics, change in appetite and fluid intake all can affect your bowels.    In order to avoid complications following surgery, here are some recommendations in order to help you during your recovery period. ° °Colace (docusate) - Pick up an over-the-counter form of Colace or another stool softener and take twice a day as long as you are requiring postoperative pain medications.  Take with a full glass of water daily.  If you experience loose stools or diarrhea, hold the colace until you stool forms back up.  If  your symptoms do not get better within 1 week or if they get worse, check with your doctor. ° °Dulcolax (bisacodyl) - Pick up over-the-counter and take as directed by the product packaging as needed to assist with the movement of your bowels.  Take with a full glass of water.  Use this product as needed if not relieved by Colace only.  ° °MiraLax (polyethylene glycol) - Pick up over-the-counter to have on hand.  MiraLax is a solution that will increase the amount of water in your bowels to assist with bowel movements.  Take as directed and can mix with a glass of water, juice, soda, coffee, or tea.  Take if you go more than two days without a movement. °Do not use MiraLax more than once per day. Call your doctor if you are still constipated or irregular after using this medication for 7 days in a row. ° °If you continue to have problems with postoperative constipation, please contact the office for further assistance and recommendations.  If you experience "the worst abdominal pain ever" or develop nausea or vomiting, please contact the office immediatly for further recommendations for treatment. ° °ITCHING ° If you experience itching with your medications, try taking only a single pain pill, or even half a pain pill at a time.  You can also use Benadryl over the counter for itching or also to help with sleep.  ° °TED HOSE STOCKINGS °Wear the elastic stockings on both legs for three weeks following surgery during the day but you may remove then at night for sleeping. ° °MEDICATIONS °See your medication summary on the “After Visit Summary” that the nursing staff will review with you prior to discharge.  You may have some home medications which will be placed on hold until you complete the course of blood thinner medication.  It is important for you to complete the blood thinner medication as prescribed by your surgeon.  Continue your approved medications as instructed at time of discharge. ° °PRECAUTIONS °If you  experience chest pain or shortness of breath - call 911 immediately for transfer to the hospital emergency department.  °If you develop a fever greater that 101 F, purulent drainage from wound, increased redness or drainage from wound, foul odor from the wound/dressing, or calf pain - CONTACT YOUR SURGEON.   °                                                °FOLLOW-UP APPOINTMENTS °Make sure you keep all of your appointments after your operation with your surgeon and caregivers. You should call the office at the above phone number and make an appointment for approximately two weeks after the date of your surgery or on the date instructed by your surgeon outlined in the "After Visit Summary". ° ° °RANGE OF MOTION AND STRENGTHENING EXERCISES  °Rehabilitation of the knee is important following a knee injury or   an operation. After just a few days of immobilization, the muscles of the thigh which control the knee become weakened and shrink (atrophy). Knee exercises are designed to build up the tone and strength of the thigh muscles and to improve knee motion. Often times heat used for twenty to thirty minutes before working out will loosen up your tissues and help with improving the range of motion but do not use heat for the first two weeks following surgery. These exercises can be done on a training (exercise) mat, on the floor, on a table or on a bed. Use what ever works the best and is most comfortable for you Knee exercises include:  °Leg Lifts - While your knee is still immobilized in a splint or cast, you can do straight leg raises. Lift the leg to 60 degrees, hold for 3 sec, and slowly lower the leg. Repeat 10-20 times 2-3 times daily. Perform this exercise against resistance later as your knee gets better.  °Quad and Hamstring Sets - Tighten up the muscle on the front of the thigh (Quad) and hold for 5-10 sec. Repeat this 10-20 times hourly. Hamstring sets are done by pushing the foot backward against an object  and holding for 5-10 sec. Repeat as with quad sets.  °· Leg Slides: Lying on your back, slowly slide your foot toward your buttocks, bending your knee up off the floor (only go as far as is comfortable). Then slowly slide your foot back down until your leg is flat on the floor again. °· Angel Wings: Lying on your back spread your legs to the side as far apart as you can without causing discomfort.  °A rehabilitation program following serious knee injuries can speed recovery and prevent re-injury in the future due to weakened muscles. Contact your doctor or a physical therapist for more information on knee rehabilitation.  ° °IF YOU ARE TRANSFERRED TO A SKILLED REHAB FACILITY °If the patient is transferred to a skilled rehab facility following release from the hospital, a list of the current medications will be sent to the facility for the patient to continue.  When discharged from the skilled rehab facility, please have the facility set up the patient's Home Health Physical Therapy prior to being released. Also, the skilled facility will be responsible for providing the patient with their medications at time of release from the facility to include their pain medication, the muscle relaxants, and their blood thinner medication. If the patient is still at the rehab facility at time of the two week follow up appointment, the skilled rehab facility will also need to assist the patient in arranging follow up appointment in our office and any transportation needs. ° °MAKE SURE YOU:  °Understand these instructions.  °Get help right away if you are not doing well or get worse.  ° ° °Pick up stool softner and laxative for home use following surgery while on pain medications. °Do not submerge incision under water. °Please use good hand washing techniques while changing dressing each day. °May shower starting three days after surgery. °Please use a clean towel to pat the incision dry following showers. °Continue to use ice for  pain and swelling after surgery. °Do not use any lotions or creams on the incision until instructed by your surgeon. ° °Take Xarelto for two and a half more weeks following discharge from the hospital, then discontinue Xarelto. °Once the patient has completed the blood thinner regimen, then take a Baby 81 mg Aspirin daily for three   more weeks.    Information on my medicine - XARELTO (Rivaroxaban)  This medication education was reviewed with me or my healthcare representative as part of my discharge preparation.   Why was Xarelto prescribed for you? Xarelto was prescribed for you to reduce the risk of blood clots forming after orthopedic surgery. The medical term for these abnormal blood clots is venous thromboembolism (VTE).  What do you need to know about xarelto ? Take your Xarelto ONCE DAILY at the same time every day. You may take it either with or without food.  If you have difficulty swallowing the tablet whole, you may crush it and mix in applesauce just prior to taking your dose.  Take Xarelto exactly as prescribed by your doctor and DO NOT stop taking Xarelto without talking to the doctor who prescribed the medication.  Stopping without other VTE prevention medication to take the place of Xarelto may increase your risk of developing a clot.  After discharge, you should have regular check-up appointments with your healthcare provider that is prescribing your Xarelto.    What do you do if you miss a dose? If you miss a dose, take it as soon as you remember on the same day then continue your regularly scheduled once daily regimen the next day. Do not take two doses of Xarelto on the same day.   Important Safety Information A possible side effect of Xarelto is bleeding. You should call your healthcare provider right away if you experience any of the following: ? Bleeding from an injury or your nose that does not stop. ? Unusual colored urine (red or dark brown) or unusual  colored stools (red or black). ? Unusual bruising for unknown reasons. ? A serious fall or if you hit your head (even if there is no bleeding).  Some medicines may interact with Xarelto and might increase your risk of bleeding while on Xarelto. To help avoid this, consult your healthcare provider or pharmacist prior to using any new prescription or non-prescription medications, including herbals, vitamins, non-steroidal anti-inflammatory drugs (NSAIDs) and supplements.  This website has more information on Xarelto: [email or web address]

## 2017-09-09 NOTE — Anesthesia Procedure Notes (Signed)
Spinal  Patient location during procedure: OR Start time: 09/09/2017 10:24 AM End time: 09/09/2017 10:28 AM Staffing Anesthesiologist: Lyn Hollingshead, MD Performed: anesthesiologist  Preanesthetic Checklist Completed: patient identified, site marked, surgical consent, pre-op evaluation, timeout performed, IV checked, risks and benefits discussed and monitors and equipment checked Spinal Block Patient position: sitting Prep: ChloraPrep and site prepped and draped Patient monitoring: continuous pulse ox and blood pressure Approach: midline Location: L3-4 Injection technique: single-shot Needle Needle type: Pencan  Needle gauge: 24 G Needle length: 10 cm Needle insertion depth: 5 cm Assessment Sensory level: T10

## 2017-09-09 NOTE — Progress Notes (Signed)
Assisted Dr. Hatchett with right, ultrasound guided, adductor canal block. Side rails up, monitors on throughout procedure. See vital signs in flow sheet. Tolerated Procedure well.  

## 2017-09-09 NOTE — Anesthesia Procedure Notes (Signed)
Date/Time: 09/09/2017 10:20 AM Performed by: Glory Buff, CRNA Oxygen Delivery Method: Nasal cannula

## 2017-09-09 NOTE — Anesthesia Preprocedure Evaluation (Addendum)
Anesthesia Evaluation  Patient identified by MRN, date of birth, ID band Patient awake    Reviewed: Allergy & Precautions, Patient's Chart, lab work & pertinent test results  History of Anesthesia Complications (+) PONV  Airway Mallampati: I       Dental no notable dental hx. (+) Teeth Intact   Pulmonary Current Smoker,    Pulmonary exam normal breath sounds clear to auscultation       Cardiovascular hypertension, Pt. on medications Normal cardiovascular exam Rhythm:Regular Rate:Normal     Neuro/Psych negative neurological ROS     GI/Hepatic   Endo/Other  Hypothyroidism   Renal/GU      Musculoskeletal   Abdominal Normal abdominal exam  (+)   Peds  Hematology   Anesthesia Other Findings Joan [name]  GATED SPECT [facility] PERF [facility] STRESS 1D  Order# [id number]  Reading physician: Skeet Latch, MD Ordering physician: Almyra Deforest, PA Study date: 01/24/17 Patient Information   Name MRN Description Joan [name] [id number] 72 y.o. Female Result Notes for Myocardial Perfusion Imaging   Notes recorded by Theodore Demark, RN on 01/25/2017 at 10:20 AM EDT Results and recommendations discussed with patient, who verbalized understanding and thanks.  ------  Notes recorded by Almyra Deforest, PA on 01/25/2017 at 7:00 AM EDT Normal pumping function of heart, no significant reversible blockage seen, continue observing symptom. No further workup planned at this time   Vitals   Height Weight BMI (Calculated) 5\' 1"  (1.549 m) 157 lb (71.2 kg) 29.7 Study Highlights    Nuclear stress EF: 64%.  The left ventricular ejection fraction is normal (55-65%).  There was no ST segment deviation noted during stress.  No T wave inversion was noted during stress.  This is a low risk study.   Nuclear     Reproductive/Obstetrics                             Anesthesia Physical Anesthesia  Plan  ASA: II  Anesthesia Plan: Spinal   Post-op Pain Management:  Regional for Post-op pain   Induction:   PONV Risk Score and Plan:   Airway Management Planned: Natural Airway, Simple Face Mask and Nasal Cannula  Additional Equipment:   Intra-op Plan:   Post-operative Plan:   Informed Consent: I have reviewed the patients History and Physical, chart, labs and discussed the procedure including the risks, benefits and alternatives for the proposed anesthesia with the patient or authorized representative who has indicated his/her understanding and acceptance.     Plan Discussed with: CRNA and Surgeon  Anesthesia Plan Comments:         Anesthesia Quick Evaluation

## 2017-09-09 NOTE — Anesthesia Postprocedure Evaluation (Signed)
Anesthesia Post Note  Patient: Joan [name]  Procedure(s) Performed: RIGHT TOTAL KNEE ARTHROPLASTY (Right Knee)     Patient location during evaluation: PACU Anesthesia Type: Spinal Level of consciousness: awake Pain management: pain level controlled Vital Signs Assessment: post-procedure vital signs reviewed and stable Respiratory status: spontaneous breathing Cardiovascular status: stable Postop Assessment: no headache, no backache, spinal receding, patient able to bend at knees and no apparent nausea or vomiting Anesthetic complications: no    Last Vitals:  Vitals:   09/09/17 1230 09/09/17 1245  BP: 107/64 110/72  Pulse: (!) 57 (!) 56  Resp: 15 13  Temp:  (!) 36.3 C  SpO2: 100% 100%    Last Pain:  Vitals:   09/09/17 1210  TempSrc:   PainSc: 0-No pain   Pain Goal: Patients Stated Pain Goal: 4 (09/09/17 1012)  LLE Motor Response: Purposeful movement (09/09/17 1245) LLE Sensation: Decreased;Numbness (09/09/17 1245) RLE Motor Response: Purposeful movement (09/09/17 1245) RLE Sensation: Decreased;Numbness (09/09/17 1245) L Sensory Level: S3-Medial thigh (09/09/17 1245) R Sensory Level: L2-Upper inner thigh, upper buttock (09/09/17 1245)  [place]

## 2017-09-09 NOTE — Evaluation (Signed)
Physical Therapy Evaluation Patient Details Name: Joan [name] MRN: [id number] DOB: 10-24-1945 Today's Date: 09/09/2017   History of Present Illness  s/p R TKA;   Clinical Impression  Pt is s/p TKA resulting in the deficits listed below (see PT Problem List).  Pt will benefit from skilled PT to increase their independence and safety with mobility to allow discharge to the venue listed below.  Pt limited by pain but willing to work within limits to be OOB,  reported feeling better once up to chair; will continue to follow in acute setting; plan is for HHPT, son coming to assist for ~1week     Follow Up Recommendations Home health PT;Supervision for mobility/OOB;Follow surgeon's recommendation for DC plan and follow-up therapies    Equipment Recommendations  None recommended by PT    Recommendations for Other Services       Precautions / Restrictions Precautions Precautions: Knee;Fall Required Braces or Orthoses: Knee Immobilizer - Right Knee Immobilizer - Right: Discontinue once straight leg raise with < 10 degree lag Restrictions Weight Bearing Restrictions: No      Mobility  Bed Mobility Overal bed mobility: Needs Assistance Bed Mobility: Supine to Sit     Supine to sit: Min assist     General bed mobility comments: assist for scooting and to elevate trunk  Transfers Overall transfer level: Needs assistance Equipment used: Rolling walker (2 wheeled) Transfers: Sit to/from Stand Sit to Stand: Min assist         General transfer comment: cues for hand placement, assist to rise  Ambulation/Gait Ambulation/Gait assistance: Min assist Ambulation Distance (Feet): 5 Feet Assistive device: Rolling walker (2 wheeled) Gait Pattern/deviations: Step-to pattern;Trunk flexed;Antalgic     General Gait Details: pivotal steps to chair, cues for sequence  Stairs            Wheelchair Mobility    Modified Rankin (Stroke Patients Only)       Balance                                              Pertinent Vitals/Pain Pain Assessment: 0-10 Pain Score: 8  Pain Location: right knee Pain Descriptors / Indicators: Discomfort;Sore Pain Intervention(s): Limited activity within patient's tolerance;Premedicated before session;Monitored during session;Repositioned    Home Living Family/patient expects to be discharged to:: Private residence Living Arrangements: Alone Available Help at Discharge: Family Type of Home: Apartment Home Access: Stairs to enter   Technical brewer of Steps: 1 Home Layout: One level Home Equipment: Environmental consultant - 2 wheels;Shower seat;Grab bars - tub/shower      Prior Function Level of Independence: Independent               Hand Dominance        Extremity/Trunk Assessment        Lower Extremity Assessment Lower Extremity Assessment: RLE deficits/detail RLE Deficits / Details: ankle WFL, knee extension and hip flexion 3/5 limited by post op pain       Communication   Communication: No difficulties  Cognition Arousal/Alertness: Awake/alert Behavior During Therapy: WFL for tasks assessed/performed Overall Cognitive Status: Within Functional Limits for tasks assessed                                        General Comments  Exercises Total Joint Exercises Ankle Circles/Pumps: AROM;Both;10 reps Quad Sets: 5 reps;Both;AROM   Assessment/Plan    PT Assessment Patient needs continued PT services  PT Problem List Decreased strength;Decreased activity tolerance;Decreased knowledge of use of DME;Decreased mobility;Pain       PT Treatment Interventions DME instruction;Gait training;Functional mobility training;Therapeutic exercise;Therapeutic activities;Patient/family education;Stair training    PT Goals (Current goals can be found in the Care Plan section)  Acute Rehab PT Goals Patient Stated Goal: walk with less pain PT Goal Formulation: With patient Time  For Goal Achievement: 09/16/17 Potential to Achieve Goals: Good    Frequency 7X/week   Barriers to discharge        Co-evaluation               AM-PAC PT "6 Clicks" Daily Activity  Outcome Measure Difficulty turning over in bed (including adjusting bedclothes, sheets and blankets)?: Unable Difficulty moving from lying on back to sitting on the side of the bed? : Unable Difficulty sitting down on and standing up from a chair with arms (e.g., wheelchair, bedside commode, etc,.)?: Unable Help needed moving to and from a bed to chair (including a wheelchair)?: A Lot Help needed walking in hospital room?: A Lot Help needed climbing 3-5 steps with a railing? : A Lot 6 Click Score: 9    End of Session Equipment Utilized During Treatment: Gait belt Activity Tolerance: Patient limited by pain Patient left: in chair;with call bell/phone within reach;with chair alarm set   PT Visit Diagnosis: Difficulty in walking, not elsewhere classified (R26.2)    Time: 4696-2952 PT Time Calculation (min) (ACUTE ONLY): 22 min   Charges:   PT Evaluation $PT Eval Low Complexity: 1 Low     PT G CodesKenyon Ana, PT Pager: [phone] 09/09/2017   [facility] 09/09/2017, 6:15 PM

## 2017-09-09 NOTE — Op Note (Signed)
OPERATIVE REPORT-TOTAL KNEE ARTHROPLASTY   Pre-operative diagnosis- Osteoarthritis  Right knee(s)  Post-operative diagnosis- Osteoarthritis Right knee(s)  Procedure-  Right  Total Knee Arthroplasty  Surgeon- Dione Plover. Zorion Nims, MD  Assistant- Arlee Muslim, PA-C   Anesthesia-  Adductor canal block and spinal  EBL-25 ml   Drains Hemovac  Tourniquet time-  Total Tourniquet Time Documented: Thigh (Right) - 35 minutes Total: Thigh (Right) - 35 minutes     Complications- None  Condition-PACU - hemodynamically stable.   Brief Clinical Note  Joan [name] is a 72 y.o. year old female with end stage OA of her right knee with progressively worsening pain and dysfunction. She has constant pain, with activity and at rest and significant functional deficits with difficulties even with ADLs. She has had extensive non-op management including analgesics, injections of cortisone and viscosupplements, and home exercise program, but remains in significant pain with significant dysfunction.Radiographs show bone on bone arthritis medial and patellofemoral. She presents now for right Total Knee Arthroplasty.    Procedure in detail---   The patient is brought into the operating room and positioned supine on the operating table. After successful administration of  Adductor canal block and spinal,   a tourniquet is placed high on the  Right thigh(s) and the lower extremity is prepped and draped in the usual sterile fashion. Time out is performed by the operating team and then the  Right lower extremity is wrapped in Esmarch, knee flexed and the tourniquet inflated to 300 mmHg.       A midline incision is made with a ten blade through the subcutaneous tissue to the level of the extensor mechanism. A fresh blade is used to make a medial parapatellar arthrotomy. Soft tissue over the proximal medial tibia is subperiosteally elevated to the joint line with a knife and into the semimembranosus bursa with a  Cobb elevator. Soft tissue over the proximal lateral tibia is elevated with attention being paid to avoiding the patellar tendon on the tibial tubercle. The patella is everted, knee flexed 90 degrees and the ACL and PCL are removed. Findings are bone on bone medial and patellofemoral with large global osteophytes.        The drill is used to create a starting hole in the distal femur and the canal is thoroughly irrigated with sterile saline to remove the fatty contents. The 5 degree Right  valgus alignment guide is placed into the femoral canal and the distal femoral cutting block is pinned to remove 10 mm off the distal femur. Resection is made with an oscillating saw.      The tibia is subluxed forward and the menisci are removed. The extramedullary alignment guide is placed referencing proximally at the medial aspect of the tibial tubercle and distally along the second metatarsal axis and tibial crest. The block is pinned to remove 27mm off the more deficient medial  side. Resection is made with an oscillating saw. Size 3is the most appropriate size for the tibia and the proximal tibia is prepared with the modular drill and keel punch for that size.      The femoral sizing guide is placed and size 4 is most appropriate. Rotation is marked off the epicondylar axis and confirmed by creating a rectangular flexion gap at 90 degrees. The size 4 cutting block is pinned in this rotation and the anterior, posterior and chamfer cuts are made with the oscillating saw. The intercondylar block is then placed and that cut is made.  Trial size 3 tibial component, trial size 4 narrow posterior stabilized femur and a 10  mm posterior stabilized rotating platform insert trial is placed. Full extension is achieved with excellent varus/valgus and anterior/posterior balance throughout full range of motion. The patella is everted and thickness measured to be 22  mm. Free hand resection is taken to 12 mm, a 35 template is  placed, lug holes are drilled, trial patella is placed, and it tracks normally. Osteophytes are removed off the posterior femur with the trial in place. All trials are removed and the cut bone surfaces prepared with pulsatile lavage. Cement is mixed and once ready for implantation, the size 4 tibial implant, size  4 narrow posterior stabilized femoral component, and the size 35 patella are cemented in place and the patella is held with the clamp. The trial insert is placed and the knee held in full extension. The Exparel (20 ml mixed with 60 ml saline) is injected into the extensor mechanism, posterior capsule, medial and lateral gutters and subcutaneous tissues.  All extruded cement is removed and once the cement is hard the permanent 10 mm posterior stabilized rotating platform insert is placed into the tibial tray.      The wound is copiously irrigated with saline solution and the extensor mechanism closed over a hemovac drain with #1 V-loc suture. The tourniquet is released for a total tourniquet time of 35  minutes. Flexion against gravity is 130 degrees and the patella tracks normally. Subcutaneous tissue is closed with 2.0 vicryl and subcuticular with running 4.0 Monocryl. The incision is cleaned and dried and steri-strips and a bulky sterile dressing are applied. The limb is placed into a knee immobilizer and the patient is awakened and transported to recovery in stable condition.      Please note that a surgical assistant was a medical necessity for this procedure in order to perform it in a safe and expeditious manner. Surgical assistant was necessary to retract the ligaments and vital neurovascular structures to prevent injury to them and also necessary for proper positioning of the limb to allow for anatomic placement of the prosthesis.   Dione Plover Joan Elrod, MD    09/09/2017, 11:32 AM

## 2017-09-09 NOTE — Anesthesia Procedure Notes (Addendum)
Anesthesia Regional Block: Adductor canal block   Pre-Anesthetic Checklist: ,, timeout performed, Correct Patient, Correct Site, Correct Laterality, Correct Procedure, Correct Position, site marked, Risks and benefits discussed,  Surgical consent,  Pre-op evaluation,  At surgeon's request and post-op pain management  Laterality: Lower and Right  Prep: chloraprep       Needles:  Injection technique: Single-shot     Needle Length: 9cm  Needle Gauge: 21   Needle insertion depth: 3 cm   Additional Needles:   Procedures:,,,, ultrasound used (permanent image in chart),,,,  Narrative:  Start time: 09/09/2017 9:58 AM End time: 09/09/2017 10:04 AM Injection made incrementally with aspirations every 5 mL.  Performed by: Personally  Anesthesiologist: Lyn Hollingshead, MD

## 2017-09-09 NOTE — Interval H&P Note (Signed)
History and Physical Interval Note:  09/09/2017 8:19 AM  Joan [name]  has presented today for surgery, with the diagnosis of Osteoarthritis Right Knee  The various methods of treatment have been discussed with the patient and family. After consideration of risks, benefits and other options for treatment, the patient has consented to  Procedure(s): RIGHT TOTAL KNEE ARTHROPLASTY (Right) as a surgical intervention .  The patient's history has been reviewed, patient examined, no change in status, stable for surgery.  I have reviewed the patient's chart and labs.  Questions were answered to the patient's satisfaction.     Pilar Plate Deldrick Linch

## 2017-09-09 NOTE — Transfer of Care (Signed)
Immediate Anesthesia Transfer of Care Note  Patient: Joan [name]  Procedure(s) Performed: RIGHT TOTAL KNEE ARTHROPLASTY (Right Knee)  Patient Location: PACU  Anesthesia Type:Spinal and MAC combined with regional for post-op pain  Level of Consciousness: awake, alert , oriented and patient cooperative  Airway & Oxygen Therapy: Patient Spontanous Breathing and Patient connected to nasal cannula oxygen  Post-op Assessment: Report given to RN and Post -op Vital signs reviewed and stable  Post vital signs: Reviewed and stable  Last Vitals:  Vitals:   09/09/17 1016 09/09/17 1017  BP: 123/83   Pulse: 65   Resp: 16 18  Temp:    SpO2: 99%     Last Pain:  Vitals:   09/09/17 1012  TempSrc:   PainSc: 0-No pain      Patients Stated Pain Goal: 4 (65/46/50 3546)  Complications: No apparent anesthesia complications

## 2017-09-10 LAB — BASIC METABOLIC PANEL
Anion gap: 6 (ref 5–15)
BUN: 19 mg/dL (ref 6–20)
CO2: 24 mmol/L (ref 22–32)
Calcium: 7.7 mg/dL — ABNORMAL LOW (ref 8.9–10.3)
Chloride: 108 mmol/L (ref 101–111)
Creatinine, Ser: 0.88 mg/dL (ref 0.44–1.00)
GFR calc Af Amer: >60 mL/min (ref >60)
GFR calc non Af Amer: >60 mL/min (ref >60)
Glucose, Bld: 160 mg/dL — ABNORMAL HIGH (ref 65–99)
Potassium: 3.7 mmol/L (ref 3.5–5.1)
Sodium: 138 mmol/L (ref 135–145)

## 2017-09-10 LAB — CBC
HCT: 26.9 % — ABNORMAL LOW (ref 36.0–46.0)
Hemoglobin: 9.1 g/dL — ABNORMAL LOW (ref 12.0–15.0)
MCH: 31.5 pg (ref 26.0–34.0)
MCHC: 33.8 g/dL (ref 30.0–36.0)
MCV: 93.1 fL (ref 78.0–100.0)
Platelets: 179 10*3/uL (ref 150–400)
RBC: 2.89 MIL/uL — ABNORMAL LOW (ref 3.87–5.11)
RDW: 13.1 % (ref 11.5–15.5)
WBC: 7.2 10*3/uL (ref 4.0–10.5)

## 2017-09-10 MED ORDER — POTASSIUM CHLORIDE CRYS ER 20 MEQ PO TBCR
40.0000 meq | EXTENDED_RELEASE_TABLET | Freq: Two times a day (BID) | ORAL | Status: DC
  Administered 2017-09-10 – 2017-09-11 (×3): 40 meq via ORAL
  Filled 2017-09-10: qty 4
  Filled 2017-09-10 (×2): qty 2

## 2017-09-10 MED ORDER — POLYSACCHARIDE IRON COMPLEX 150 MG PO CAPS
150.0000 mg | ORAL_CAPSULE | Freq: Every day | ORAL | Status: DC
  Administered 2017-09-10 – 2017-09-11 (×2): 150 mg via ORAL
  Filled 2017-09-10 (×2): qty 1

## 2017-09-10 MED ORDER — OXYCODONE HCL 15 MG PO TABS: 15.0000 mg | ORAL_TABLET | ORAL | 0 refills | Status: AC | PRN

## 2017-09-10 MED ORDER — POLYSACCHARIDE IRON COMPLEX 150 MG PO CAPS: 150.0000 mg | ORAL_CAPSULE | Freq: Every day | ORAL | 0 refills | Status: DC

## 2017-09-10 MED ORDER — SODIUM CHLORIDE 0.9 % IV BOLUS (SEPSIS)
250.0000 mL | Freq: Once | INTRAVENOUS | Status: AC
  Administered 2017-09-10: 250 mL via INTRAVENOUS

## 2017-09-10 MED ORDER — RIVAROXABAN 10 MG PO TABS: 10.0000 mg | ORAL_TABLET | Freq: Every day | ORAL | 0 refills | Status: DC

## 2017-09-10 NOTE — Progress Notes (Signed)
Subjective: 1 Day Post-Op Procedure(s) (LRB): RIGHT TOTAL KNEE ARTHROPLASTY (Right) Patient reports pain as moderate.   Patient seen in rounds for Dr. Wynelle Link. She had a rough night with pain and little sleep. HGB down to 9.1 - Add iron. Patient is well, but has had some minor complaints of pain in the knee, requiring pain medications We will resume therapy today.  She got up and walked 5 feet to the chair yesterday. Plan is to go Home after hospital stay.  Objective: Vital signs in last 24 hours: Temp:  [97.4 F (36.3 C)-99.3 F (37.4 C)] 97.9 F (36.6 C) (03/05 0625) Pulse Rate:  [56-86] 60 (03/05 0625) Resp:  [7-34] 15 (03/05 0625) BP: (107-140)/(42-89) 108/47 (03/05 0625) SpO2:  [93 %-100 %] 95 % (03/05 0625)  Intake/Output from previous day:  Intake/Output Summary (Last 24 hours) at 09/10/2017 0827 Last data filed at 09/10/2017 0626 Gross per 24 hour  Intake 3635.26 ml  Output 1335 ml  Net 2300.26 ml    Intake/Output this shift: No intake/output data recorded.  Labs: Recent Labs    09/10/17 0559  HGB 9.1*   Recent Labs    09/10/17 0559  WBC 7.2  RBC 2.89*  HCT 26.9*  PLT 179   Recent Labs    09/10/17 0559  NA 138  K 3.7  CL 108  CO2 24  BUN 19  CREATININE 0.88  GLUCOSE 160*  CALCIUM 7.7*   No results for input(s): LABPT, INR in the last 72 hours.  EXAM General - Patient is Alert, Appropriate and Oriented Extremity - Neurovascular intact Sensation intact distally Intact pulses distally Dorsiflexion/Plantar flexion intact Dressing - dressing C/D/I Motor Function - intact, moving foot and toes well on exam.  Hemovac pulled without difficulty.  Past Medical History:  Diagnosis Date  . Arthritis    "shoulders; wrist; probably spine" (06/24/2013)  . Chronic low back pain    followed by Dr Hardin Negus pain mgt  . Colon polyp   . Constipation   . Esophageal stricture   . Finger pain, left    2 fingers on left hand since wrist surgery  .  GERD (gastroesophageal reflux disease)   . Heart murmur    "slight; not on RX" (06/24/2013)  . History of cardiac arrhythmia    cardiologist- Traci Turner  . Hx of colonic polyps 08/28/2004  . Hyperlipemia   . Hypertension   . Hypothyroidism   . Osteoarthritis of left knee    advanced  . PONV (postoperative nausea and vomiting)    Pt reports symptoms are the result of gallbladder and cholecystectomy, not anesthesia  . PVC's (premature ventricular contractions)   . Sleep apnea 1990's   "tested; tried mask; couldn't stand it; told me as long as I slept on my side I'd be ok" (06/24/2013)  . Thyroid nodule   . Tobacco abuse     Assessment/Plan: 1 Day Post-Op Procedure(s) (LRB): RIGHT TOTAL KNEE ARTHROPLASTY (Right) Principal Problem:   OA (osteoarthritis) of knee  Estimated body mass index is 30.73 kg/m as calculated from the following:   Height as of this encounter: 5' 0.5" (1.537 m).   Weight as of this encounter: 72.6 kg (160 lb). Advance diet Up with therapy Plan for discharge tomorrow Discharge home with home health  Add Iron Additional fluids this morning and recheck pressures. She is asking about her potassium tablets she takes at home  Dr. Nicholaus Bloom - Pain Management Physician Patient states that Dr. Hardin Negus stopped her  hydrocodone preop and gave her a RX for Percocet 10/325 to take for one month postop and then will resume the hydrocodone after a month.  She is currently taking Oxy 15 here.  Depending upon pain levels, may need the 15 mg tab at discharge for a week or so and then she will have the Percocet RX recommended by Dr. Hardin Negus.  DVT Prophylaxis - Xarelto Weight-Bearing as tolerated to right leg D/C O2 and Pulse OX and try on Room Air  Arlee Muslim, PA-C Orthopaedic Surgery 09/10/2017, 8:27 AM

## 2017-09-10 NOTE — Progress Notes (Signed)
Physical Therapy Treatment Patient Details Name: Joan [name] MRN: [id number] DOB: [birth year] Today's Date: 09/10/2017    History of Present Illness s/p R TKA    PT Comments    Pt progressing well,  incr gait distance and tolerance to activity today; continue PT POC;  Follow Up Recommendations  Home health PT;Supervision for mobility/OOB;Follow surgeon's recommendation for DC plan and follow-up therapies     Equipment Recommendations  None recommended by PT    Recommendations for Other Services       Precautions / Restrictions Precautions Precautions: Knee;Fall Precaution Comments: independent SLRs Required Braces or Orthoses: Knee Immobilizer - Right Knee Immobilizer - Right: Discontinue once straight leg raise with < 10 degree lag Restrictions Weight Bearing Restrictions: No    Mobility  Bed Mobility Overal bed mobility: Needs Assistance Bed Mobility: Sit to Sidelying         Sit to sidelying: Min guard General bed mobility comments: min/guard for RLE onto bed, pt to s/l d/t hx of back surgeries  Transfers Overall transfer level: Needs assistance Equipment used: Rolling walker (2 wheeled) Transfers: Sit to/from Stand Sit to Stand: Min assist;Min guard         General transfer comment: cues for hand placement adn right LE position  Ambulation/Gait Ambulation/Gait assistance: Min guard Ambulation Distance (Feet): 140 Feet Assistive device: Rolling walker (2 wheeled) Gait Pattern/deviations: Step-to pattern;Trunk flexed     General Gait Details: cues for sequence, initially antalgic however able to progress to step through pattern   Stairs            Wheelchair Mobility    Modified Rankin (Stroke Patients Only)       Balance                                            Cognition Arousal/Alertness: Awake/alert Behavior During Therapy: WFL for tasks assessed/performed Overall Cognitive Status: Within Functional Limits  for tasks assessed                                        Exercises Total Joint Exercises Ankle Circles/Pumps: AROM;Both;10 reps Quad Sets: AROM;Both;10 reps Towel Squeeze: AROM;Both;10 reps Short Arc QuadSinclair Ship;Right;5 reps Heel Slides: AAROM;Right;10 reps Hip ABduction/ADduction: AROM;Right;Strengthening;10 reps Straight Leg Raises: AROM;Right;10 reps Goniometric ROM:  6* to 60* flexion    General Comments        Pertinent Vitals/Pain Pain Assessment: 0-10 Pain Score: 4  Pain Location: right knee Pain Descriptors / Indicators: Discomfort;Sore Pain Intervention(s): Limited activity within patient's tolerance;Monitored during session;Premedicated before session;Repositioned;Ice applied    Home Living                      Prior Function            PT Goals (current goals can now be found in the care plan section) Acute Rehab PT Goals Patient Stated Goal: walk with less pain PT Goal Formulation: With patient Time For Goal Achievement: 09/16/17 Potential to Achieve Goals: Good Progress towards PT goals: Progressing toward goals    Frequency    7X/week      PT Plan Current plan remains appropriate    Co-evaluation              AM-PAC PT "6 Clicks"  Daily Activity  Outcome Measure  Difficulty turning over in bed (including adjusting bedclothes, sheets and blankets)?: A Little Difficulty moving from lying on back to sitting on the side of the bed? : A Little Difficulty sitting down on and standing up from a chair with arms (e.g., wheelchair, bedside commode, etc,.)?: A Little Help needed moving to and from a bed to chair (including a wheelchair)?: A Little Help needed walking in hospital room?: A Little Help needed climbing 3-5 steps with a railing? : A Lot 6 Click Score: 17    End of Session Equipment Utilized During Treatment: Gait belt Activity Tolerance: Patient tolerated treatment well Patient left: in chair;with call  bell/phone within reach;with chair alarm set   PT Visit Diagnosis: Difficulty in walking, not elsewhere classified (R26.2)     Time: 1026-1050 PT Time Calculation (min) (ACUTE ONLY): 24 min  Charges:  $Gait Training: 8-22 mins $Therapeutic Exercise: 8-22 mins                    G CodesKenyon Ana, PT Pager: [phone] 09/10/2017    Kenyon Ana 09/10/2017, 11:36 AM

## 2017-09-10 NOTE — Progress Notes (Signed)
Discharge planning, spoke with patient at bedside. Have chosen [facility] for HH PT, evaluate and treat. Contacted [facility] for referral. Has DME. [phone] 

## 2017-09-10 NOTE — Progress Notes (Signed)
   09/10/17 1400  PT Visit Information  Last PT Received On 09/10/17 Pt progressing well; planning for d/c tomorrow; will  Certainly need petite height walker fro use at home   Assistance Needed +1  History of Present Illness s/p R TKA  Subjective Data  Subjective I am starting to hurt  Patient Stated Goal walk with less pain  Precautions  Precautions Knee;Fall  Precaution Comments independent SLRs  Restrictions  Weight Bearing Restrictions No  Pain Assessment  Pain Assessment 0-10  Pain Score 4  Pain Location right knee  Pain Descriptors / Indicators Discomfort;Sore  Pain Intervention(s) Monitored during session;Limited activity within patient's tolerance  Cognition  Arousal/Alertness Awake/alert  Behavior During Therapy WFL for tasks assessed/performed  Overall Cognitive Status Within Functional Limits for tasks assessed  Bed Mobility  Overal bed mobility Needs Assistance  Bed Mobility Sit to Sidelying  Sit to sidelying Min guard  General bed mobility comments min/guard for RLE onto bed, pt to s/l d/t hx of back surgeries  Transfers  Overall transfer level Needs assistance  Equipment used Rolling walker (2 wheeled)  Transfers Sit to/from Stand  Sit to Stand Min assist;Min guard  General transfer comment cues for hand placement and right LE position  Ambulation/Gait  Ambulation/Gait assistance Min guard  Ambulation Distance (Feet) 145 Feet  Assistive device Rolling walker (2 wheeled)  Gait Pattern/deviations Step-to pattern;Trunk flexed  General Gait Details cues for posture, sequence  PT - End of Session  Equipment Utilized During Treatment Gait belt  Activity Tolerance Patient tolerated treatment well  Patient left in bed;with call bell/phone within reach;with bed alarm set  PT - Assessment/Plan  PT Plan Current plan remains appropriate  PT Visit Diagnosis Difficulty in walking, not elsewhere classified (R26.2)  PT Frequency (ACUTE ONLY) 7X/week  Follow Up  Recommendations Home health PT;Supervision for mobility/OOB;Follow surgeon's recommendation for DC plan and follow-up therapies  PT equipment None recommended by PT  AM-PAC PT "6 Clicks" Daily Activity Outcome Measure  Difficulty turning over in bed (including adjusting bedclothes, sheets and blankets)? 3  Difficulty moving from lying on back to sitting on the side of the bed?  3  Difficulty sitting down on and standing up from a chair with arms (e.g., wheelchair, bedside commode, etc,.)? 3  Help needed moving to and from a bed to chair (including a wheelchair)? 3  Help needed walking in hospital room? 3  Help needed climbing 3-5 steps with a railing?  3  6 Click Score 18  Mobility G Code  CK  PT Goal Progression  Progress towards PT goals Progressing toward goals  Acute Rehab PT Goals  PT Goal Formulation With patient  Time For Goal Achievement 09/16/17  Potential to Achieve Goals Good  PT Time Calculation  PT Start Time (ACUTE ONLY) 1343  PT Stop Time (ACUTE ONLY) 1404  PT Time Calculation (min) (ACUTE ONLY) 21 min  PT General Charges  $$ ACUTE PT VISIT 1 Visit  PT Treatments  $Gait Training 8-22 mins

## 2017-09-10 NOTE — Plan of Care (Signed)
Reviewed plan of care with pt, specifically pain control measures, safety concerns, and importance of notifying RN with any questions or concerns. Pt attentive and verbalized understanding of all education.

## 2017-09-10 NOTE — Discharge Summary (Signed)
Physician Discharge Summary   Patient ID: Joan [name] MRN: [id number] DOB/AGE: 73-Sep-1947 73 y.o.  Admit date: 09/09/2017 Discharge date: 09/11/2017  Primary Diagnosis:  Osteoarthritis Right knee(s)   Admission Diagnoses:  Past Medical History:  Diagnosis Date  . Arthritis    "shoulders; wrist; probably spine" (06/24/2013)  . Chronic low back pain    followed by Dr Hardin Negus pain mgt  . Colon polyp   . Constipation   . Esophageal stricture   . Finger pain, left    2 fingers on left hand since wrist surgery  . GERD (gastroesophageal reflux disease)   . Heart murmur    "slight; not on RX" (06/24/2013)  . History of cardiac arrhythmia    cardiologist- Traci Turner  . Hx of colonic polyps 08/28/2004  . Hyperlipemia   . Hypertension   . Hypothyroidism   . Osteoarthritis of left knee    advanced  . PONV (postoperative nausea and vomiting)    Pt reports symptoms are the result of gallbladder and cholecystectomy, not anesthesia  . PVC's (premature ventricular contractions)   . Sleep apnea 1990's   "tested; tried mask; couldn't stand it; told me as long as I slept on my side I'd be ok" (06/24/2013)  . Thyroid nodule   . Tobacco abuse    Discharge Diagnoses:   Principal Problem:   OA (osteoarthritis) of knee  Estimated body mass index is 30.73 kg/m as calculated from the following:   Height as of this encounter: 5' 0.5" (1.537 m).   Weight as of this encounter: 72.6 kg (160 lb).  Procedure:  Procedure(s) (LRB): RIGHT TOTAL KNEE ARTHROPLASTY (Right)   Consults: None  HPI: Joan [name] is a 72 y.o. year old female with end stage OA of her right knee with progressively worsening pain and dysfunction. She has constant pain, with activity and at rest and significant functional deficits with difficulties even with ADLs. She has had extensive non-op management including analgesics, injections of cortisone and viscosupplements, and home exercise program, but remains in  significant pain with significant dysfunction.Radiographs show bone on bone arthritis medial and patellofemoral. She presents now for right Total Knee Arthroplasty.     Laboratory Data: Admission on 09/09/2017  Component Date Value Ref Range Status  . WBC 09/10/2017 7.2  4.0 - 10.5 K/uL Final  . RBC 09/10/2017 2.89* 3.87 - 5.11 MIL/uL Final  . Hemoglobin 09/10/2017 9.1* 12.0 - 15.0 g/dL Final  . HCT 09/10/2017 26.9* 36.0 - 46.0 % Final  . MCV 09/10/2017 93.1  78.0 - 100.0 fL Final  . MCH 09/10/2017 31.5  26.0 - 34.0 pg Final  . MCHC 09/10/2017 33.8  30.0 - 36.0 g/dL Final  . RDW 09/10/2017 13.1  11.5 - 15.5 % Final  . Platelets 09/10/2017 179  150 - 400 K/uL Final   Performed at [facility], [place] [address] [place], [place] [address]  . Sodium 09/10/2017 138  135 - 145 mmol/L Final  . Potassium 09/10/2017 3.7  3.5 - 5.1 mmol/L Final  . Chloride 09/10/2017 108  101 - 111 mmol/L Final  . CO2 09/10/2017 24  22 - 32 mmol/L Final  . Glucose, Bld 09/10/2017 160* 65 - 99 mg/dL Final  . BUN 09/10/2017 19  6 - 20 mg/dL Final  . Creatinine, Ser 09/10/2017 0.88  0.44 - 1.00 mg/dL Final  . Calcium 09/10/2017 7.7* 8.9 - 10.3 mg/dL Final  . GFR calc non Af Amer 09/10/2017 >60  >60 mL/min Final  .  GFR calc Af Amer 09/10/2017 >60  >60 mL/min Final   Comment: (NOTE) The eGFR has been calculated using the CKD EPI equation. This calculation has not been validated in all clinical situations. eGFR's persistently <60 mL/min signify possible Chronic Kidney Disease.   Georgiann Hahn gap 09/10/2017 6  5 - 15 Final   Performed at [facility], [place] [address], [place], [place] [address]  Hospital Outpatient Visit on 09/03/2017  Component Date Value Ref Range Status  . MRSA, PCR 09/03/2017 NEGATIVE  NEGATIVE Final  . Staphylococcus aureus 09/03/2017 POSITIVE* NEGATIVE Final   Comment: (NOTE) The Xpert SA Assay (FDA approved for NASAL specimens in patients 29 years of age  and older), is one component of a comprehensive surveillance program. It is not intended to diagnose infection nor to guide or monitor treatment. Performed at [facility], [place] [address], [place], [place] [address]   . aPTT 09/03/2017 30  24 - 36 seconds Final   Performed at [facility], [place] [address], [place], [place] [address]  . WBC 09/03/2017 4.7  4.0 - 10.5 K/uL Final  . RBC 09/03/2017 3.95  3.87 - 5.11 MIL/uL Final  . Hemoglobin 09/03/2017 12.2  12.0 - 15.0 g/dL Final  . HCT 09/03/2017 37.0  36.0 - 46.0 % Final  . MCV 09/03/2017 93.7  78.0 - 100.0 fL Final  . MCH 09/03/2017 30.9  26.0 - 34.0 pg Final  . MCHC 09/03/2017 33.0  30.0 - 36.0 g/dL Final  . RDW 09/03/2017 13.7  11.5 - 15.5 % Final  . Platelets 09/03/2017 228  150 - 400 K/uL Final   Performed at [facility], [place] [address], [place], [place] [address]  . Sodium 09/03/2017 142  135 - 145 mmol/L Final  . Potassium 09/03/2017 4.1  3.5 - 5.1 mmol/L Final  . Chloride 09/03/2017 104  101 - 111 mmol/L Final  . CO2 09/03/2017 30  22 - 32 mmol/L Final  . Glucose, Bld 09/03/2017 97  65 - 99 mg/dL Final  . BUN 09/03/2017 27* 6 - 20 mg/dL Final  . Creatinine, Ser 09/03/2017 1.07* 0.44 - 1.00 mg/dL Final  . Calcium 09/03/2017 9.0  8.9 - 10.3 mg/dL Final  . Total Protein 09/03/2017 7.0  6.5 - 8.1 g/dL Final  . Albumin 09/03/2017 4.3  3.5 - 5.0 g/dL Final  . AST 09/03/2017 21  15 - 41 U/L Final  . ALT 09/03/2017 12* 14 - 54 U/L Final  . Alkaline Phosphatase 09/03/2017 44  38 - 126 U/L Final  . Total Bilirubin 09/03/2017 0.6  0.3 - 1.2 mg/dL Final  . GFR calc non Af Amer 09/03/2017 51* >60 mL/min Final  . GFR calc Af Amer 09/03/2017 59* >60 mL/min Final   Comment: (NOTE) The eGFR has been calculated using the CKD EPI equation. This calculation has not been validated in all clinical situations. eGFR's persistently <60 mL/min signify possible Chronic Kidney Disease.   Georgiann Hahn gap 09/03/2017 8  5 - 15 Final   Performed at [facility], [place] [address] [place], [place] [address]  . Prothrombin Time 09/03/2017 12.8  11.4 - 15.2 seconds Final  . INR 09/03/2017 0.97   Final   Performed at [facility], [place] [address] [place], [place] [address]  . ABO/RH(D) 09/03/2017 A POS   Final  . Antibody Screen 09/03/2017 NEG   Final  . Sample Expiration 09/03/2017 09/12/2017   Final  .  Extend sample reason 09/03/2017    Final                   Value:NO TRANSFUSIONS OR PREGNANCY IN THE PAST 3 MONTHS Performed at [place] [address] [place], [place] [address]   . ABO/RH(D) 09/03/2017    Final                   Value:A POS Performed at [facility], [place] [address] [place], [place] [address]      X-Rays:No results found.  EKG: Orders placed or performed in visit on 01/10/17  . EKG 12-Lead     Hospital Course: Joan [name] is a 72 y.o. who was admitted to [facility]. They were brought to the operating room on 09/09/2017 and underwent Procedure(s): RIGHT TOTAL KNEE ARTHROPLASTY.  Patient tolerated the procedure well and was later transferred to the recovery room and then to the orthopaedic floor for postoperative care.  They were given PO and IV analgesics for pain control following their surgery.  They were given 24 hours of postoperative antibiotics of  Anti-infectives (From admission, onward)   Start     Dose/Rate Route Frequency Ordered Stop   09/09/17 1630  ceFAZolin (ANCEF) IVPB 2g/100 mL premix     2 g 200 mL/hr over 30 Minutes Intravenous Every 6 hours 09/09/17 1305 09/09/17 2324   09/09/17 0813  ceFAZolin (ANCEF) IVPB 2g/100 mL premix     2 g 200 mL/hr over 30 Minutes Intravenous On call to O.R. 09/09/17 0813 09/09/17 1059     and started on DVT prophylaxis in the form of Xarelto.   PT and OT were ordered for total joint protocol.  Discharge planning consulted  to help with postop disposition and equipment needs.  Patient had a tough night on the evening of surgery but got up for a few steps.  They started to get up OOB with therapy on day one. Hemovac drain was pulled without difficulty.  Continued to work with therapy into day two.  Dressing was changed on day two and the incision was healing well.  Patient was seen in rounds on POD 2 and was ready to go home.  Diet - Cardiac diet Follow up - in 2 weeks Activity - WBAT Disposition - Home Condition Upon Discharge - Good D/C Meds - See DC Summary DVT Prophylaxis - Xarelto     Discharge Instructions    Call MD / Call 911   Complete by:  As directed    If you experience chest pain or shortness of breath, CALL 911 and be transported to the hospital emergency room.  If you develope a fever above 101 F, pus (white drainage) or increased drainage or redness at the wound, or calf pain, call your surgeon's office.   Change dressing   Complete by:  As directed    Change dressing daily with sterile 4 x 4 inch gauze dressing and apply TED hose. Do not submerge the incision under water.   Constipation Prevention   Complete by:  As directed    Drink plenty of fluids.  Prune juice may be helpful.  You may use a stool softener, such as Colace (over the counter) 100 mg twice a day.  Use MiraLax (over the counter) for constipation as needed.   Diet - low sodium heart healthy   Complete by:  As directed    Discharge instructions   Complete by:  As  directed    Take Xarelto for two and a half more weeks, then discontinue Xarelto. Once the patient has completed the blood thinner regimen, then take a Baby 81 mg Aspirin daily for three more weeks.   Pick up stool softner and laxative for home use following surgery while on pain medications. Do not submerge incision under water. Please use good hand washing techniques while changing dressing each day. May shower starting three days after surgery. Please use a  clean towel to pat the incision dry following showers. Continue to use ice for pain and swelling after surgery. Do not use any lotions or creams on the incision until instructed by your surgeon.  Wear both TED hose on both legs during the day every day for three weeks, but may remove the TED hose at night at home.  Postoperative Constipation Protocol  Constipation - defined medically as fewer than three stools per week and severe constipation as less than one stool per week.  One of the most common issues patients have following surgery is constipation.  Even if you have a regular bowel pattern at home, your normal regimen is likely to be disrupted due to multiple reasons following surgery.  Combination of anesthesia, postoperative narcotics, change in appetite and fluid intake all can affect your bowels.  In order to avoid complications following surgery, here are some recommendations in order to help you during your recovery period.  Colace (docusate) - Pick up an over-the-counter form of Colace or another stool softener and take twice a day as long as you are requiring postoperative pain medications.  Take with a full glass of water daily.  If you experience loose stools or diarrhea, hold the colace until you stool forms back up.  If your symptoms do not get better within 1 week or if they get worse, check with your doctor.  Dulcolax (bisacodyl) - Pick up over-the-counter and take as directed by the product packaging as needed to assist with the movement of your bowels.  Take with a full glass of water.  Use this product as needed if not relieved by Colace only.   MiraLax (polyethylene glycol) - Pick up over-the-counter to have on hand.  MiraLax is a solution that will increase the amount of water in your bowels to assist with bowel movements.  Take as directed and can mix with a glass of water, juice, soda, coffee, or tea.  Take if you go more than two days without a movement. Do not use MiraLax  more than once per day. Call your doctor if you are still constipated or irregular after using this medication for 7 days in a row.  If you continue to have problems with postoperative constipation, please contact the office for further assistance and recommendations.  If you experience "the worst abdominal pain ever" or develop nausea or vomiting, please contact the office immediatly for further recommendations for treatment.   Do not put a pillow under the knee. Place it under the heel.   Complete by:  As directed    Do not sit on low chairs, stoools or toilet seats, as it may be difficult to get up from low surfaces   Complete by:  As directed    Driving restrictions   Complete by:  As directed    No driving until released by the physician.   Increase activity slowly as tolerated   Complete by:  As directed    Lifting restrictions   Complete by:  As directed  No lifting until released by the physician.   Patient may shower   Complete by:  As directed    You may shower without a dressing once there is no drainage.  Do not wash over the wound.  If drainage remains, do not shower until drainage stops.   TED hose   Complete by:  As directed    Use stockings (TED hose) for 3 weeks on both leg(s).  You may remove them at night for sleeping.   Weight bearing as tolerated   Complete by:  As directed    Laterality:  right   Extremity:  Lower     Allergies as of 09/10/2017      Reactions   Morphine And Related Swelling   Amitriptyline Other (See Comments)   Leg swelling   Gabapentin Itching   neurontin   Triamterene Itching, Rash      Medication List    STOP taking these medications   estradiol 0.5 MG tablet Commonly known as:  ESTRACE   HYDROcodone-acetaminophen 10-325 MG tablet Commonly known as:  NORCO   VOLTAREN 1 % Gel Generic drug:  diclofenac sodium     TAKE these medications   bisacodyl 5 MG EC tablet Commonly known as:  DULCOLAX Take 5 mg by mouth at bedtime.     carisoprodol 350 MG tablet Commonly known as:  SOMA Take 125 mg by mouth 3 (three) times daily. For muscle spasms   dorzolamide-timolol 22.3-6.8 MG/ML ophthalmic solution Commonly known as:  COSOPT Place 1 drop into the left eye 2 (two) times daily.   fenofibrate 160 MG tablet TAKE ONE TABLET BY MOUTH ONCE DAILY.   furosemide 20 MG tablet Commonly known as:  LASIX TAKE 1-2 TABLET BY MOUTH ONCE DAILY AS NEEDED FOR  FLUID   hydrochlorothiazide 25 MG tablet Commonly known as:  HYDRODIURIL TAKE 1 TABLET BY MOUTH  DAILY   iron polysaccharides 150 MG capsule Commonly known as:  NIFEREX Take 1 capsule (150 mg total) by mouth daily. Start taking on:  09/11/2017   levothyroxine 88 MCG tablet Commonly known as:  SYNTHROID, LEVOTHROID TAKE 1 TABLET BY MOUTH  DAILY BEFORE BREAKFAST   magnesium gluconate 500 MG tablet Commonly known as:  MAGONATE Take 500 mg by mouth daily.   oxyCODONE 15 MG immediate release tablet Commonly known as:  ROXICODONE Take 1 tablet (15 mg total) by mouth every 4 (four) hours as needed for up to 7 days for moderate pain or severe pain.   potassium chloride SA 20 MEQ tablet Commonly known as:  K-DUR,KLOR-CON TAKE 3 TABLETS BY MOUTH  DAILY   rivaroxaban 10 MG Tabs tablet Commonly known as:  XARELTO Take 1 tablet (10 mg total) by mouth daily with breakfast. Take Xarelto for two and a half more weeks following discharge from the hospital, then discontinue Xarelto. Once the patient has completed the blood thinner regimen, then take a Baby 81 mg Aspirin daily for three more weeks. Start taking on:  09/11/2017   simvastatin 20 MG tablet Commonly known as:  ZOCOR TAKE 1 TABLET BY MOUTH AT  BEDTIME   traZODone 50 MG tablet Commonly known as:  DESYREL Take 1 tablet (50 mg total) by mouth at bedtime.            Durable Medical Equipment  (From admission, onward)        Start     Ordered   09/10/17 1632  For home use only DME Walker rolling  Once  Comments:  Pediatric/Junior sized walker  Question:  Patient needs a walker to treat with the following condition  Answer:  Total knee replacement status, right   09/10/17 1632       Discharge Care Instructions  (From admission, onward)        Start     Ordered   09/10/17 0000  Weight bearing as tolerated    Question Answer Comment  Laterality right   Extremity Lower      09/10/17 2201   09/10/17 0000  Change dressing    Comments:  Change dressing daily with sterile 4 x 4 inch gauze dressing and apply TED hose. Do not submerge the incision under water.   09/10/17 2201     Follow-up Information    [facility] up.   Specialty:  [place] Why:  physical therapy Contact information: [place] [address] [phone]        Gaynelle Arabian, MD. Schedule an appointment as soon as possible for a visit on 09/24/2017.   Specialty:  Orthopedic Surgery Contact information: [address] [place] [address] [phone]           Signed: Arlee Muslim, PA-C Orthopaedic Surgery 09/10/2017, 10:02 PM

## 2017-09-11 LAB — CBC
HCT: 27.9 % — ABNORMAL LOW (ref 36.0–46.0)
Hemoglobin: 9.5 g/dL — ABNORMAL LOW (ref 12.0–15.0)
MCH: 31.5 pg (ref 26.0–34.0)
MCHC: 34.1 g/dL (ref 30.0–36.0)
MCV: 92.4 fL (ref 78.0–100.0)
Platelets: 197 10*3/uL (ref 150–400)
RBC: 3.02 MIL/uL — ABNORMAL LOW (ref 3.87–5.11)
RDW: 13.6 % (ref 11.5–15.5)
WBC: 7.8 10*3/uL (ref 4.0–10.5)

## 2017-09-11 LAB — BASIC METABOLIC PANEL
Anion gap: 6 (ref 5–15)
BUN: 16 mg/dL (ref 6–20)
CO2: 28 mmol/L (ref 22–32)
Calcium: 8.1 mg/dL — ABNORMAL LOW (ref 8.9–10.3)
Chloride: 104 mmol/L (ref 101–111)
Creatinine, Ser: 0.92 mg/dL (ref 0.44–1.00)
GFR calc Af Amer: >60 mL/min (ref >60)
GFR calc non Af Amer: >60 mL/min (ref >60)
Glucose, Bld: 121 mg/dL — ABNORMAL HIGH (ref 65–99)
Potassium: 4 mmol/L (ref 3.5–5.1)
Sodium: 138 mmol/L (ref 135–145)

## 2017-09-11 NOTE — Progress Notes (Signed)
Physical Therapy Treatment Patient Details Name: Joan [name] MRN: [id number] DOB: 09-Feb-1946 Today's Date: 09/11/2017    History of Present Illness s/p R TKA    PT Comments    Pt progressing slowly, will have assist from son for the next 4-5 days; recommennd supervision/assist when OOB; will benefit from HHPT  Follow Up Recommendations  Home health PT;Supervision for mobility/OOB;Follow surgeon's recommendation for DC plan and follow-up therapies     Equipment Recommendations  Other (comment)(petite RW)    Recommendations for Other Services       Precautions / Restrictions Precautions Precautions: Knee;Fall Precaution Comments: KI applied d/t pt unable to perform IND SLRs d/t pain Required Braces or Orthoses: Knee Immobilizer - Right Knee Immobilizer - Right: Discontinue once straight leg raise with < 10 degree lag Restrictions Weight Bearing Restrictions: No    Mobility  Bed Mobility   Bed Mobility: Sit to Sidelying           General bed mobility comments: min/guard for RLE onto bed, pt to s/l d/t hx of back surgeries  Transfers Overall transfer level: Needs assistance Equipment used: Rolling walker (2 wheeled) Transfers: Sit to/from Stand Sit to Stand: Supervision         General transfer comment: cues for hand placement and right LE position  Ambulation/Gait Ambulation/Gait assistance: Min guard Ambulation Distance (Feet): 50 Feet Assistive device: Rolling walker (2 wheeled) Gait Pattern/deviations: Step-to pattern;Trunk flexed     General Gait Details: cues for posture, sequence, breathing; 2 standing rest breaks   Stairs Stairs: Yes   Stair Management: Step to pattern;With walker;Backwards;No rails Number of Stairs: 1(x1) General stair comments: pt has only curb at home; reviewed and explained rationale for technique, pain control, etc; pt somewhat argumentative regarding technique but eventually complied with recommendations after   technique reviewed  Wheelchair Mobility    Modified Rankin (Stroke Patients Only)       Balance                                            Cognition Arousal/Alertness: Awake/alert Behavior During Therapy: WFL for tasks assessed/performed Overall Cognitive Status: Within Functional Limits for tasks assessed                                        Exercises Total Joint Exercises Straight Leg Raises: Limitations Straight Leg Raises Limitations: unable to perform HEP d/t pain level; SLR x2, pt declined to continue    General Comments        Pertinent Vitals/Pain Pain Assessment: 0-10 Pain Score: 9  Pain Location: right knee Pain Descriptors / Indicators: Sore;Grimacing;Guarding;Moaning Pain Intervention(s): Limited activity within patient's tolerance;Monitored during session;Repositioned;Ice applied    Home Living                      Prior Function            PT Goals (current goals can now be found in the care plan section) Acute Rehab PT Goals Patient Stated Goal: walk with less pain PT Goal Formulation: With patient Time For Goal Achievement: 09/16/17 Potential to Achieve Goals: Good Progress towards PT goals: Progressing toward goals    Frequency    7X/week      PT Plan Current plan remains appropriate  Co-evaluation              AM-PAC PT "6 Clicks" Daily Activity  Outcome Measure  Difficulty turning over in bed (including adjusting bedclothes, sheets and blankets)?: A Little Difficulty moving from lying on back to sitting on the side of the bed? : A Little Difficulty sitting down on and standing up from a chair with arms (e.g., wheelchair, bedside commode, etc,.)?: A Little Help needed moving to and from a bed to chair (including a wheelchair)?: A Little Help needed walking in hospital room?: A Little Help needed climbing 3-5 steps with a railing? : A Little 6 Click Score: 18    End of  Session Equipment Utilized During Treatment: Gait belt;Right knee immobilizer Activity Tolerance: Patient tolerated treatment well Patient left: in bed;with call bell/phone within reach Nurse Communication: Other (comment)(ready for d/c) PT Visit Diagnosis: Difficulty in walking, not elsewhere classified (R26.2)     Time: 0263-7858 PT Time Calculation (min) (ACUTE ONLY): 28 min  Charges:  $Gait Training: 23-37 mins                    G CodesKenyon Ana, PT Pager: [phone] 09/11/2017    Kenyon Ana 09/11/2017, 11:55 AM

## 2017-09-11 NOTE — Progress Notes (Signed)
   Subjective: 2 Days Post-Op Procedure(s) (LRB): RIGHT TOTAL KNEE ARTHROPLASTY (Right) Patient reports pain as mild.   Patient seen in rounds with Dr. Wynelle Link. Patient is well, but has had some minor complaints of pain in the knee, requiring pain medications Patient is ready to go home  Objective: Vital signs in last 24 hours: Temp:  [98.6 F (37 C)-98.8 F (37.1 C)] 98.7 F (37.1 C) (03/06 0431) Pulse Rate:  [62-71] 71 (03/06 0431) Resp:  [14-18] 14 (03/06 0431) BP: (103-140)/(50-59) 140/59 (03/06 0431) SpO2:  [95 %-97 %] 95 % (03/06 0431)  Intake/Output from previous day:  Intake/Output Summary (Last 24 hours) at 09/11/2017 0733 Last data filed at 09/11/2017 0600 Gross per 24 hour  Intake 1393.33 ml  Output 500 ml  Net 893.33 ml    Intake/Output this shift: No intake/output data recorded.  Labs: Recent Labs    09/10/17 0559  HGB 9.1*   Recent Labs    09/10/17 0559  WBC 7.2  RBC 2.89*  HCT 26.9*  PLT 179   Recent Labs    09/10/17 0559 09/11/17 0553  NA 138 138  K 3.7 4.0  CL 108 104  CO2 24 28  BUN 19 16  CREATININE 0.88 0.92  GLUCOSE 160* 121*  CALCIUM 7.7* 8.1*   No results for input(s): LABPT, INR in the last 72 hours.  EXAM: General - Patient is Alert, Appropriate and Oriented Extremity - Neurovascular intact Sensation intact distally Intact pulses distally Dorsiflexion/Plantar flexion intact Incision - clean, dry Motor Function - intact, moving foot and toes well on exam.   Assessment/Plan: 2 Days Post-Op Procedure(s) (LRB): RIGHT TOTAL KNEE ARTHROPLASTY (Right) Procedure(s) (LRB): RIGHT TOTAL KNEE ARTHROPLASTY (Right) Past Medical History:  Diagnosis Date  . Arthritis    "shoulders; wrist; probably spine" (06/24/2013)  . Chronic low back pain    followed by Dr Hardin Negus pain mgt  . Colon polyp   . Constipation   . Esophageal stricture   . Finger pain, left    2 fingers on left hand since wrist surgery  . GERD (gastroesophageal  reflux disease)   . Heart murmur    "slight; not on RX" (06/24/2013)  . History of cardiac arrhythmia    cardiologist- Traci Turner  . Hx of colonic polyps 08/28/2004  . Hyperlipemia   . Hypertension   . Hypothyroidism   . Osteoarthritis of left knee    advanced  . PONV (postoperative nausea and vomiting)    Pt reports symptoms are the result of gallbladder and cholecystectomy, not anesthesia  . PVC's (premature ventricular contractions)   . Sleep apnea 1990's   "tested; tried mask; couldn't stand it; told me as long as I slept on my side I'd be ok" (06/24/2013)  . Thyroid nodule   . Tobacco abuse    Principal Problem:   OA (osteoarthritis) of knee  Estimated body mass index is 30.73 kg/m as calculated from the following:   Height as of this encounter: 5' 0.5" (1.537 m).   Weight as of this encounter: 72.6 kg (160 lb). Up with therapy Diet - Cardiac diet Follow up - in 2 weeks Activity - WBAT Disposition - Home Condition Upon Discharge - Good D/C Meds - See DC Summary DVT Prophylaxis - Xarelto  Arlee Muslim, PA-C Orthopaedic Surgery 09/11/2017, 7:33 AM

## 2017-09-11 NOTE — Progress Notes (Signed)
Patient discharged to home with family. Given all belongings, instructions, prescriptions, equipment. Patient verbalized understanding of all instructions. Escorted to pov via w/c.

## 2017-09-12 DIAGNOSIS — Z79891 Long term (current) use of opiate analgesic: Secondary | ICD-10-CM | POA: Diagnosis not present

## 2017-09-12 DIAGNOSIS — I10 Essential (primary) hypertension: Secondary | ICD-10-CM | POA: Diagnosis not present

## 2017-09-12 DIAGNOSIS — Z9181 History of falling: Secondary | ICD-10-CM | POA: Diagnosis not present

## 2017-09-12 DIAGNOSIS — Z96651 Presence of right artificial knee joint: Secondary | ICD-10-CM | POA: Diagnosis not present

## 2017-09-12 DIAGNOSIS — H269 Unspecified cataract: Secondary | ICD-10-CM | POA: Diagnosis not present

## 2017-09-12 DIAGNOSIS — Z471 Aftercare following joint replacement surgery: Secondary | ICD-10-CM | POA: Diagnosis not present

## 2017-09-12 DIAGNOSIS — I700 Atherosclerosis of aorta: Secondary | ICD-10-CM | POA: Diagnosis not present

## 2017-09-12 DIAGNOSIS — M19019 Primary osteoarthritis, unspecified shoulder: Secondary | ICD-10-CM | POA: Diagnosis not present

## 2017-09-12 DIAGNOSIS — M48061 Spinal stenosis, lumbar region without neurogenic claudication: Secondary | ICD-10-CM | POA: Diagnosis not present

## 2017-09-12 DIAGNOSIS — M1712 Unilateral primary osteoarthritis, left knee: Secondary | ICD-10-CM | POA: Diagnosis not present

## 2017-09-13 DIAGNOSIS — Z471 Aftercare following joint replacement surgery: Secondary | ICD-10-CM | POA: Diagnosis not present

## 2017-09-13 DIAGNOSIS — M1712 Unilateral primary osteoarthritis, left knee: Secondary | ICD-10-CM | POA: Diagnosis not present

## 2017-09-13 DIAGNOSIS — H269 Unspecified cataract: Secondary | ICD-10-CM | POA: Diagnosis not present

## 2017-09-13 DIAGNOSIS — Z9181 History of falling: Secondary | ICD-10-CM | POA: Diagnosis not present

## 2017-09-13 DIAGNOSIS — I10 Essential (primary) hypertension: Secondary | ICD-10-CM | POA: Diagnosis not present

## 2017-09-13 DIAGNOSIS — Z96651 Presence of right artificial knee joint: Secondary | ICD-10-CM | POA: Diagnosis not present

## 2017-09-13 DIAGNOSIS — I700 Atherosclerosis of aorta: Secondary | ICD-10-CM | POA: Diagnosis not present

## 2017-09-13 DIAGNOSIS — M48061 Spinal stenosis, lumbar region without neurogenic claudication: Secondary | ICD-10-CM | POA: Diagnosis not present

## 2017-09-13 DIAGNOSIS — Z79891 Long term (current) use of opiate analgesic: Secondary | ICD-10-CM | POA: Diagnosis not present

## 2017-09-13 DIAGNOSIS — M19019 Primary osteoarthritis, unspecified shoulder: Secondary | ICD-10-CM | POA: Diagnosis not present

## 2017-09-17 DIAGNOSIS — Z79891 Long term (current) use of opiate analgesic: Secondary | ICD-10-CM | POA: Diagnosis not present

## 2017-09-17 DIAGNOSIS — Z9181 History of falling: Secondary | ICD-10-CM | POA: Diagnosis not present

## 2017-09-17 DIAGNOSIS — I10 Essential (primary) hypertension: Secondary | ICD-10-CM | POA: Diagnosis not present

## 2017-09-17 DIAGNOSIS — Z471 Aftercare following joint replacement surgery: Secondary | ICD-10-CM | POA: Diagnosis not present

## 2017-09-17 DIAGNOSIS — M1712 Unilateral primary osteoarthritis, left knee: Secondary | ICD-10-CM | POA: Diagnosis not present

## 2017-09-17 DIAGNOSIS — M48061 Spinal stenosis, lumbar region without neurogenic claudication: Secondary | ICD-10-CM | POA: Diagnosis not present

## 2017-09-17 DIAGNOSIS — I700 Atherosclerosis of aorta: Secondary | ICD-10-CM | POA: Diagnosis not present

## 2017-09-17 DIAGNOSIS — H269 Unspecified cataract: Secondary | ICD-10-CM | POA: Diagnosis not present

## 2017-09-17 DIAGNOSIS — Z96651 Presence of right artificial knee joint: Secondary | ICD-10-CM | POA: Diagnosis not present

## 2017-09-17 DIAGNOSIS — M19019 Primary osteoarthritis, unspecified shoulder: Secondary | ICD-10-CM | POA: Diagnosis not present

## 2017-09-18 DIAGNOSIS — M19019 Primary osteoarthritis, unspecified shoulder: Secondary | ICD-10-CM | POA: Diagnosis not present

## 2017-09-18 DIAGNOSIS — Z9181 History of falling: Secondary | ICD-10-CM | POA: Diagnosis not present

## 2017-09-18 DIAGNOSIS — H269 Unspecified cataract: Secondary | ICD-10-CM | POA: Diagnosis not present

## 2017-09-18 DIAGNOSIS — M48061 Spinal stenosis, lumbar region without neurogenic claudication: Secondary | ICD-10-CM | POA: Diagnosis not present

## 2017-09-18 DIAGNOSIS — Z79891 Long term (current) use of opiate analgesic: Secondary | ICD-10-CM | POA: Diagnosis not present

## 2017-09-18 DIAGNOSIS — M1712 Unilateral primary osteoarthritis, left knee: Secondary | ICD-10-CM | POA: Diagnosis not present

## 2017-09-18 DIAGNOSIS — I700 Atherosclerosis of aorta: Secondary | ICD-10-CM | POA: Diagnosis not present

## 2017-09-18 DIAGNOSIS — Z96651 Presence of right artificial knee joint: Secondary | ICD-10-CM | POA: Diagnosis not present

## 2017-09-18 DIAGNOSIS — I10 Essential (primary) hypertension: Secondary | ICD-10-CM | POA: Diagnosis not present

## 2017-09-18 DIAGNOSIS — Z471 Aftercare following joint replacement surgery: Secondary | ICD-10-CM | POA: Diagnosis not present

## 2017-09-20 DIAGNOSIS — Z79891 Long term (current) use of opiate analgesic: Secondary | ICD-10-CM | POA: Diagnosis not present

## 2017-09-20 DIAGNOSIS — M19019 Primary osteoarthritis, unspecified shoulder: Secondary | ICD-10-CM | POA: Diagnosis not present

## 2017-09-20 DIAGNOSIS — H269 Unspecified cataract: Secondary | ICD-10-CM | POA: Diagnosis not present

## 2017-09-20 DIAGNOSIS — Z471 Aftercare following joint replacement surgery: Secondary | ICD-10-CM | POA: Diagnosis not present

## 2017-09-20 DIAGNOSIS — Z9181 History of falling: Secondary | ICD-10-CM | POA: Diagnosis not present

## 2017-09-20 DIAGNOSIS — I700 Atherosclerosis of aorta: Secondary | ICD-10-CM | POA: Diagnosis not present

## 2017-09-20 DIAGNOSIS — M48061 Spinal stenosis, lumbar region without neurogenic claudication: Secondary | ICD-10-CM | POA: Diagnosis not present

## 2017-09-20 DIAGNOSIS — I10 Essential (primary) hypertension: Secondary | ICD-10-CM | POA: Diagnosis not present

## 2017-09-20 DIAGNOSIS — M1712 Unilateral primary osteoarthritis, left knee: Secondary | ICD-10-CM | POA: Diagnosis not present

## 2017-09-20 DIAGNOSIS — Z96651 Presence of right artificial knee joint: Secondary | ICD-10-CM | POA: Diagnosis not present

## 2017-09-23 DIAGNOSIS — I10 Essential (primary) hypertension: Secondary | ICD-10-CM | POA: Diagnosis not present

## 2017-09-23 DIAGNOSIS — M19019 Primary osteoarthritis, unspecified shoulder: Secondary | ICD-10-CM | POA: Diagnosis not present

## 2017-09-23 DIAGNOSIS — Z471 Aftercare following joint replacement surgery: Secondary | ICD-10-CM | POA: Diagnosis not present

## 2017-09-23 DIAGNOSIS — Z79891 Long term (current) use of opiate analgesic: Secondary | ICD-10-CM | POA: Diagnosis not present

## 2017-09-23 DIAGNOSIS — M1712 Unilateral primary osteoarthritis, left knee: Secondary | ICD-10-CM | POA: Diagnosis not present

## 2017-09-23 DIAGNOSIS — M48061 Spinal stenosis, lumbar region without neurogenic claudication: Secondary | ICD-10-CM | POA: Diagnosis not present

## 2017-09-23 DIAGNOSIS — Z96651 Presence of right artificial knee joint: Secondary | ICD-10-CM | POA: Diagnosis not present

## 2017-09-23 DIAGNOSIS — I700 Atherosclerosis of aorta: Secondary | ICD-10-CM | POA: Diagnosis not present

## 2017-09-23 DIAGNOSIS — H269 Unspecified cataract: Secondary | ICD-10-CM | POA: Diagnosis not present

## 2017-09-23 DIAGNOSIS — Z9181 History of falling: Secondary | ICD-10-CM | POA: Diagnosis not present

## 2017-09-24 DIAGNOSIS — Z96659 Presence of unspecified artificial knee joint: Secondary | ICD-10-CM | POA: Insufficient documentation

## 2017-09-25 DIAGNOSIS — M48061 Spinal stenosis, lumbar region without neurogenic claudication: Secondary | ICD-10-CM | POA: Diagnosis not present

## 2017-09-25 DIAGNOSIS — Z9181 History of falling: Secondary | ICD-10-CM | POA: Diagnosis not present

## 2017-09-25 DIAGNOSIS — M19019 Primary osteoarthritis, unspecified shoulder: Secondary | ICD-10-CM | POA: Diagnosis not present

## 2017-09-25 DIAGNOSIS — H269 Unspecified cataract: Secondary | ICD-10-CM | POA: Diagnosis not present

## 2017-09-25 DIAGNOSIS — Z471 Aftercare following joint replacement surgery: Secondary | ICD-10-CM | POA: Diagnosis not present

## 2017-09-25 DIAGNOSIS — Z79891 Long term (current) use of opiate analgesic: Secondary | ICD-10-CM | POA: Diagnosis not present

## 2017-09-25 DIAGNOSIS — M1712 Unilateral primary osteoarthritis, left knee: Secondary | ICD-10-CM | POA: Diagnosis not present

## 2017-09-25 DIAGNOSIS — I10 Essential (primary) hypertension: Secondary | ICD-10-CM | POA: Diagnosis not present

## 2017-09-25 DIAGNOSIS — I700 Atherosclerosis of aorta: Secondary | ICD-10-CM | POA: Diagnosis not present

## 2017-09-25 DIAGNOSIS — Z96651 Presence of right artificial knee joint: Secondary | ICD-10-CM | POA: Diagnosis not present

## 2017-09-27 DIAGNOSIS — M19019 Primary osteoarthritis, unspecified shoulder: Secondary | ICD-10-CM | POA: Diagnosis not present

## 2017-09-27 DIAGNOSIS — Z79891 Long term (current) use of opiate analgesic: Secondary | ICD-10-CM | POA: Diagnosis not present

## 2017-09-27 DIAGNOSIS — Z9181 History of falling: Secondary | ICD-10-CM | POA: Diagnosis not present

## 2017-09-27 DIAGNOSIS — H269 Unspecified cataract: Secondary | ICD-10-CM | POA: Diagnosis not present

## 2017-09-27 DIAGNOSIS — Z96651 Presence of right artificial knee joint: Secondary | ICD-10-CM | POA: Diagnosis not present

## 2017-09-27 DIAGNOSIS — M48061 Spinal stenosis, lumbar region without neurogenic claudication: Secondary | ICD-10-CM | POA: Diagnosis not present

## 2017-09-27 DIAGNOSIS — M1712 Unilateral primary osteoarthritis, left knee: Secondary | ICD-10-CM | POA: Diagnosis not present

## 2017-09-27 DIAGNOSIS — I10 Essential (primary) hypertension: Secondary | ICD-10-CM | POA: Diagnosis not present

## 2017-09-27 DIAGNOSIS — I700 Atherosclerosis of aorta: Secondary | ICD-10-CM | POA: Diagnosis not present

## 2017-09-27 DIAGNOSIS — Z471 Aftercare following joint replacement surgery: Secondary | ICD-10-CM | POA: Diagnosis not present

## 2017-09-30 DIAGNOSIS — M19019 Primary osteoarthritis, unspecified shoulder: Secondary | ICD-10-CM | POA: Diagnosis not present

## 2017-09-30 DIAGNOSIS — H269 Unspecified cataract: Secondary | ICD-10-CM | POA: Diagnosis not present

## 2017-09-30 DIAGNOSIS — Z96651 Presence of right artificial knee joint: Secondary | ICD-10-CM | POA: Diagnosis not present

## 2017-09-30 DIAGNOSIS — Z471 Aftercare following joint replacement surgery: Secondary | ICD-10-CM | POA: Diagnosis not present

## 2017-09-30 DIAGNOSIS — Z9181 History of falling: Secondary | ICD-10-CM | POA: Diagnosis not present

## 2017-09-30 DIAGNOSIS — Z79891 Long term (current) use of opiate analgesic: Secondary | ICD-10-CM | POA: Diagnosis not present

## 2017-09-30 DIAGNOSIS — I700 Atherosclerosis of aorta: Secondary | ICD-10-CM | POA: Diagnosis not present

## 2017-09-30 DIAGNOSIS — M48061 Spinal stenosis, lumbar region without neurogenic claudication: Secondary | ICD-10-CM | POA: Diagnosis not present

## 2017-09-30 DIAGNOSIS — M1712 Unilateral primary osteoarthritis, left knee: Secondary | ICD-10-CM | POA: Diagnosis not present

## 2017-09-30 DIAGNOSIS — I10 Essential (primary) hypertension: Secondary | ICD-10-CM | POA: Diagnosis not present

## 2017-10-02 DIAGNOSIS — Z9181 History of falling: Secondary | ICD-10-CM | POA: Diagnosis not present

## 2017-10-02 DIAGNOSIS — Z96651 Presence of right artificial knee joint: Secondary | ICD-10-CM | POA: Diagnosis not present

## 2017-10-02 DIAGNOSIS — I10 Essential (primary) hypertension: Secondary | ICD-10-CM | POA: Diagnosis not present

## 2017-10-02 DIAGNOSIS — M1712 Unilateral primary osteoarthritis, left knee: Secondary | ICD-10-CM | POA: Diagnosis not present

## 2017-10-02 DIAGNOSIS — I700 Atherosclerosis of aorta: Secondary | ICD-10-CM | POA: Diagnosis not present

## 2017-10-02 DIAGNOSIS — M48061 Spinal stenosis, lumbar region without neurogenic claudication: Secondary | ICD-10-CM | POA: Diagnosis not present

## 2017-10-02 DIAGNOSIS — M19019 Primary osteoarthritis, unspecified shoulder: Secondary | ICD-10-CM | POA: Diagnosis not present

## 2017-10-02 DIAGNOSIS — H269 Unspecified cataract: Secondary | ICD-10-CM | POA: Diagnosis not present

## 2017-10-02 DIAGNOSIS — Z471 Aftercare following joint replacement surgery: Secondary | ICD-10-CM | POA: Diagnosis not present

## 2017-10-02 DIAGNOSIS — Z79891 Long term (current) use of opiate analgesic: Secondary | ICD-10-CM | POA: Diagnosis not present

## 2017-10-04 DIAGNOSIS — H269 Unspecified cataract: Secondary | ICD-10-CM | POA: Diagnosis not present

## 2017-10-04 DIAGNOSIS — M1712 Unilateral primary osteoarthritis, left knee: Secondary | ICD-10-CM | POA: Diagnosis not present

## 2017-10-04 DIAGNOSIS — I700 Atherosclerosis of aorta: Secondary | ICD-10-CM | POA: Diagnosis not present

## 2017-10-04 DIAGNOSIS — M48061 Spinal stenosis, lumbar region without neurogenic claudication: Secondary | ICD-10-CM | POA: Diagnosis not present

## 2017-10-04 DIAGNOSIS — I10 Essential (primary) hypertension: Secondary | ICD-10-CM | POA: Diagnosis not present

## 2017-10-04 DIAGNOSIS — Z96651 Presence of right artificial knee joint: Secondary | ICD-10-CM | POA: Diagnosis not present

## 2017-10-04 DIAGNOSIS — Z79891 Long term (current) use of opiate analgesic: Secondary | ICD-10-CM | POA: Diagnosis not present

## 2017-10-04 DIAGNOSIS — Z9181 History of falling: Secondary | ICD-10-CM | POA: Diagnosis not present

## 2017-10-04 DIAGNOSIS — Z471 Aftercare following joint replacement surgery: Secondary | ICD-10-CM | POA: Diagnosis not present

## 2017-10-04 DIAGNOSIS — M19019 Primary osteoarthritis, unspecified shoulder: Secondary | ICD-10-CM | POA: Diagnosis not present

## 2017-10-07 DIAGNOSIS — Z79891 Long term (current) use of opiate analgesic: Secondary | ICD-10-CM | POA: Diagnosis not present

## 2017-10-07 DIAGNOSIS — I700 Atherosclerosis of aorta: Secondary | ICD-10-CM | POA: Diagnosis not present

## 2017-10-07 DIAGNOSIS — H269 Unspecified cataract: Secondary | ICD-10-CM | POA: Diagnosis not present

## 2017-10-07 DIAGNOSIS — M1712 Unilateral primary osteoarthritis, left knee: Secondary | ICD-10-CM | POA: Diagnosis not present

## 2017-10-07 DIAGNOSIS — I10 Essential (primary) hypertension: Secondary | ICD-10-CM | POA: Diagnosis not present

## 2017-10-07 DIAGNOSIS — M48061 Spinal stenosis, lumbar region without neurogenic claudication: Secondary | ICD-10-CM | POA: Diagnosis not present

## 2017-10-07 DIAGNOSIS — Z9181 History of falling: Secondary | ICD-10-CM | POA: Diagnosis not present

## 2017-10-07 DIAGNOSIS — Z471 Aftercare following joint replacement surgery: Secondary | ICD-10-CM | POA: Diagnosis not present

## 2017-10-07 DIAGNOSIS — M19019 Primary osteoarthritis, unspecified shoulder: Secondary | ICD-10-CM | POA: Diagnosis not present

## 2017-10-07 DIAGNOSIS — Z96651 Presence of right artificial knee joint: Secondary | ICD-10-CM | POA: Diagnosis not present

## 2017-10-08 ENCOUNTER — Other Ambulatory Visit: Payer: Self-pay | Admitting: Family Medicine

## 2017-10-08 DIAGNOSIS — I10 Essential (primary) hypertension: Secondary | ICD-10-CM

## 2017-10-09 DIAGNOSIS — I700 Atherosclerosis of aorta: Secondary | ICD-10-CM | POA: Diagnosis not present

## 2017-10-09 DIAGNOSIS — Z79891 Long term (current) use of opiate analgesic: Secondary | ICD-10-CM | POA: Diagnosis not present

## 2017-10-09 DIAGNOSIS — Z9181 History of falling: Secondary | ICD-10-CM | POA: Diagnosis not present

## 2017-10-09 DIAGNOSIS — M19019 Primary osteoarthritis, unspecified shoulder: Secondary | ICD-10-CM | POA: Diagnosis not present

## 2017-10-09 DIAGNOSIS — H269 Unspecified cataract: Secondary | ICD-10-CM | POA: Diagnosis not present

## 2017-10-09 DIAGNOSIS — I10 Essential (primary) hypertension: Secondary | ICD-10-CM | POA: Diagnosis not present

## 2017-10-09 DIAGNOSIS — Z96651 Presence of right artificial knee joint: Secondary | ICD-10-CM | POA: Diagnosis not present

## 2017-10-09 DIAGNOSIS — M1712 Unilateral primary osteoarthritis, left knee: Secondary | ICD-10-CM | POA: Diagnosis not present

## 2017-10-09 DIAGNOSIS — Z471 Aftercare following joint replacement surgery: Secondary | ICD-10-CM | POA: Diagnosis not present

## 2017-10-09 DIAGNOSIS — M48061 Spinal stenosis, lumbar region without neurogenic claudication: Secondary | ICD-10-CM | POA: Diagnosis not present

## 2017-10-11 DIAGNOSIS — M1712 Unilateral primary osteoarthritis, left knee: Secondary | ICD-10-CM | POA: Diagnosis not present

## 2017-10-11 DIAGNOSIS — Z96651 Presence of right artificial knee joint: Secondary | ICD-10-CM | POA: Diagnosis not present

## 2017-10-11 DIAGNOSIS — M19019 Primary osteoarthritis, unspecified shoulder: Secondary | ICD-10-CM | POA: Diagnosis not present

## 2017-10-11 DIAGNOSIS — Z9181 History of falling: Secondary | ICD-10-CM | POA: Diagnosis not present

## 2017-10-11 DIAGNOSIS — Z79891 Long term (current) use of opiate analgesic: Secondary | ICD-10-CM | POA: Diagnosis not present

## 2017-10-11 DIAGNOSIS — H269 Unspecified cataract: Secondary | ICD-10-CM | POA: Diagnosis not present

## 2017-10-11 DIAGNOSIS — Z471 Aftercare following joint replacement surgery: Secondary | ICD-10-CM | POA: Diagnosis not present

## 2017-10-11 DIAGNOSIS — I10 Essential (primary) hypertension: Secondary | ICD-10-CM | POA: Diagnosis not present

## 2017-10-11 DIAGNOSIS — M48061 Spinal stenosis, lumbar region without neurogenic claudication: Secondary | ICD-10-CM | POA: Diagnosis not present

## 2017-10-11 DIAGNOSIS — I700 Atherosclerosis of aorta: Secondary | ICD-10-CM | POA: Diagnosis not present

## 2017-10-15 DIAGNOSIS — Z96651 Presence of right artificial knee joint: Secondary | ICD-10-CM | POA: Diagnosis not present

## 2017-10-16 ENCOUNTER — Other Ambulatory Visit: Payer: Self-pay

## 2017-10-16 ENCOUNTER — Telehealth (HOSPITAL_COMMUNITY): Payer: Self-pay | Admitting: Family Medicine

## 2017-10-16 ENCOUNTER — Encounter (HOSPITAL_COMMUNITY): Payer: Self-pay

## 2017-10-16 ENCOUNTER — Ambulatory Visit (HOSPITAL_COMMUNITY): Payer: Medicare Other | Attending: Orthopedic Surgery

## 2017-10-16 DIAGNOSIS — R262 Difficulty in walking, not elsewhere classified: Secondary | ICD-10-CM | POA: Diagnosis not present

## 2017-10-16 DIAGNOSIS — M6281 Muscle weakness (generalized): Secondary | ICD-10-CM | POA: Diagnosis not present

## 2017-10-16 DIAGNOSIS — M25561 Pain in right knee: Secondary | ICD-10-CM

## 2017-10-16 DIAGNOSIS — R2681 Unsteadiness on feet: Secondary | ICD-10-CM | POA: Diagnosis not present

## 2017-10-16 NOTE — Therapy (Signed)
[place], [place], [address] Phone: [phone]   Fax:  [phone]  Physical Therapy Evaluation  Patient Details  Name: Joan [name] MRN: [id number] Date of Birth: 06/02/1946 Referring Provider: Gaynelle Arabian, MD   Encounter Date: 10/16/2017  PT End of Session - 10/16/17 1254    Visit Number  1    Number of Visits  8    Date for PT Re-Evaluation  11/13/17 mini-reassess 10/30/2017    Authorization Type  [employer] Medicare [employer] Plan 1 HMO complete; no deduct; copay $40/visit, coins OOP 4400 - $804.24    Authorization Time Period  medical necessity, no auth, no ref    PT Start Time  0946    PT Stop Time  1031    PT Time Calculation (min)  45 min    Activity Tolerance  Patient limited by pain    Behavior During Therapy  [facility] for tasks assessed/performed       Past Medical History:  Diagnosis Date  . Arthritis    "shoulders; wrist; probably spine" (06/24/2013)  . Chronic low back pain    followed by Dr Hardin Negus pain mgt  . Colon polyp   . Constipation   . Esophageal stricture   . Finger pain, left    2 fingers on left hand since wrist surgery  . GERD (gastroesophageal reflux disease)   . Heart murmur    "slight; not on RX" (06/24/2013)  . History of cardiac arrhythmia    cardiologist- Traci Turner  . Hx of colonic polyps 08/28/2004  . Hyperlipemia   . Hypertension   . Hypothyroidism   . Osteoarthritis of left knee    advanced  . PONV (postoperative nausea and vomiting)    Pt reports symptoms are the result of gallbladder and cholecystectomy, not anesthesia  . PVC's (premature ventricular contractions)   . Sleep apnea 1990's   "tested; tried mask; couldn't stand it; told me as long as I slept on my side I'd be ok" (06/24/2013)  . Thyroid nodule   . Tobacco abuse     Past Surgical History:  Procedure Laterality Date  . ABDOMINAL HYSTERECTOMY  1988   "partial" (06/24/2013)  . ABDOMINAL HYSTERECTOMY      partial in 1988  . ANKLE SURGERY Left 1995   "tendon repair" (06/24/2013)  . BACK SURGERY     "think today was my 8th back OR" (06/24/2013)  . CERVICAL SPINE SURGERY  2012  . [place]  . CHOLECYSTECTOMY N/A 04/16/2013   Procedure: LAPAROSCOPIC CHOLECYSTECTOMY ;  Surgeon: Imogene Burn. Georgette Dover, MD;  Location: [facility];  Service: General;  Laterality: N/A;  . ELBOW SURGERY  1990's  . FOOT SURGERY Right 2012   SPUR REMOVED  . INFUSION PUMP IMPLANTATION  1990's   "implantablet morphine pump; took it out w/in 11 months  . KNEE ARTHROSCOPY Left 1991; ~ 1993  . LAPAROSCOPIC LYSIS OF ADHESIONS N/A 04/16/2013   Procedure: LAPAROSCOPIC LYSIS OF ADHESIONS;  Surgeon: Imogene Burn. Georgette Dover, MD;  Location: [facility];  Service: General;  Laterality: N/A;  . LUMBAR FUSION     and rods  . LUMBAR LAMINECTOMY/DECOMPRESSION MICRODISCECTOMY N/A 11/28/2012   Procedure: DECOMPRESSIVE LUMBAR LAMINECTOMY LEVEL 1;  Surgeon: Elaina Hoops, MD;  Location: [facility];  Service: Neurosurgery;  Laterality: N/A;  DECOMPRESSIVE LUMBAR LAMINECTOMY LEVEL 1  . ORIF DISTAL RADIUS FRACTURE Left 12/30/2013   dr Caralyn Guile  . ORIF WRIST FRACTURE Left 12/30/2013  Procedure: OPEN REDUCTION INTERNAL FIXATION (ORIF) LEFT WRIST FRACTURE AND REPAIR AS INDICATED;  Surgeon: Linna Hoff, MD;  Location: [facility];  Service: Orthopedics;  Laterality: Left;  . POSTERIOR FUSION LUMBAR SPINE  06/24/2013  . ROBOTIC ASSISTED SALPINGO OOPHERECTOMY Bilateral 08/20/2014   Procedure: ROBOTIC ASSISTED SALPINGO OOPHORECTOMY;  Surgeon: Daria Pastures, MD;  Location: [facility];  Service: Gynecology;  Laterality: Bilateral;  . SHOULDER ARTHROSCOPY Left 09/2011  . THYROIDECTOMY  2012  . TOTAL KNEE ARTHROPLASTY Right 09/09/2017   Procedure: RIGHT TOTAL KNEE ARTHROPLASTY;  Surgeon: Gaynelle Arabian, MD;  Location: [facility];  Service: Orthopedics;  Laterality: Right;    There were no vitals filed for this visit.   Subjective Assessment - 10/16/17 0933     Subjective  R TKA 09/09/2017; fell Oct 2017 and damaged the Rt knee on concrete; had steroid and Synvisc injections and they didn't help so had Rt TKA.     Pertinent History  IA, 8 prior lumbar spine surgeries with metal plates and fusions with left sciatica, left wrist fracture    Limitations  House hold activities;Lifting;Walking;Sitting;Standing    How long can you sit comfortably?  30 minutes    How long can you stand comfortably?  15 minutes    How long can you walk comfortably?  15 minutes    Patient Stated Goals  get back to where I was before, get out of pain, get more rt knee bend, and be able to perform my regular activities    Currently in Pain?  Yes    Pain Score  6     Pain Orientation  Right    Pain Descriptors / Indicators  Throbbing;Sharp    Pain Type  Surgical pain    Pain Onset  More than a month ago    Pain Frequency  Intermittent    Aggravating Factors   sitting too long, stand, walk,     Pain Relieving Factors  higher level pain meds, ice, rubbing knee    Multiple Pain Sites  Yes    Pain Score  5    Pain Location  Back    Pain Orientation  Left to left knee    Pain Descriptors / Indicators  Throbbing    Pain Type  Chronic pain    Pain Onset  More than a month ago    Pain Frequency  Constant         [facility] PT Assessment - 10/16/17 0001      Assessment   Medical Diagnosis  R TKA     Referring Provider  Gaynelle Arabian, MD    Onset Date/Surgical Date  09/09/17    Hand Dominance  Right      Precautions   Precautions  None      Restrictions   Weight Bearing Restrictions  No      Balance Screen   Has the patient fallen in the past 6 months  No    Has the patient had a decrease in activity level because of a fear of falling?   Yes    Is the patient reluctant to leave their home because of a fear of falling?   Yes      [place] residence    Living Arrangements  Alone    Type of [place] Access  Level entry     [place]  One level      Prior Function  Level of Independence  Independent    Vocation  Retired    Leisure  go shopping, crafts      Cognition   Overall Cognitive Status  Within Functional Limits for tasks assessed      Observation/Other Assessments   Focus on Therapeutic Outcomes (FOTO)   53% limited      ROM / Strength   AROM / PROM / Strength  AROM;Strength      AROM   AROM Assessment Site  Knee    Right/Left Knee  Right    Right Knee Extension  6    Right Knee Flexion  83      Strength   Strength Assessment Site  Hip;Knee;Ankle    Right/Left Hip  Right;Left    Right Hip Flexion  4-/5    Right Hip Extension  4-/5    Right Hip ABduction  3+/5    Left Hip Flexion  4-/5    Left Hip Extension  4-/5    Left Hip ABduction  3+/5    Right/Left Knee  Right;Left    Right Knee Flexion  4-/5    Right Knee Extension  4+/5    Left Knee Flexion  4-/5    Left Knee Extension  4+/5    Right/Left Ankle  Right;Left    Right Ankle Dorsiflexion  5/5    Left Ankle Dorsiflexion  5/5      Flexibility   Soft Tissue Assessment /Muscle Length  yes      Transfers   Five time sit to stand comments   15      Ambulation/Gait   Ambulation/Gait  Yes    Ambulation/Gait Assistance  6: Modified independent (Device/Increase time) SPC in left hand    Ambulation Distance (Feet)  688 Feet 3MWT    Assistive device  Straight cane    Gait Pattern  Step-through pattern;Decreased stance time - left;Decreased hip/knee flexion - right;Right flexed knee in stance;Antalgic;Poor foot clearance - right    Gait velocity  3.8 m/s      Balance   Balance Assessed  Yes      Static Standing Balance   Static Standing - Balance Support  No upper extremity supported    Static Standing - Level of Assistance  5: Stand by assistance    Static Standing - Comment/# of Minutes  lt 11 sec; rt 17 sec                Objective measurements completed on examination: See above findings.               PT Education - 10/16/17 1253    Education provided  Yes    Education Details  advancing HHPT HEP hold times to 5-10 sec/rep    Person(s) Educated  Patient    Methods  Explanation    Comprehension  Need further instruction       PT Short Term Goals - 10/16/17 1443      PT SHORT TERM GOAL #1   Title  pt independent in intial HEP    Time  2    Period  Weeks    Status  New    Target Date  10/30/17      PT SHORT TERM GOAL #2   Title  Increase Rt knee AROM extension to < 10 degrees and flexion to > 95 to improve gait    Baseline  13 to 86    Time  2    Period  Weeks  Status  New      PT SHORT TERM GOAL #3   Title  Patient will exhibit in in MMT by 1/2 grade for bilateral lower extremities to improve endurance and ability to walk > 2 blocks    Time  2    Period  Weeks    Status  New      PT SHORT TERM GOAL #4   Title  Patient will have a reduction in pain so she is able to return to using OTC pain medications.    Time  2    Period  Weeks    Status  New        PT Long Term Goals - 10/16/17 1447      PT LONG TERM GOAL #1   Title  pt independent with advanced HEP as necessary    Time  4    Period  Weeks    Status  New    Target Date  11/13/17      PT LONG TERM GOAL #2   Title  Increase Rt knee AROM extension to < 5 degrees and flexion to > 105 to improve gait    Time  4    Period  Weeks    Status  New      PT LONG TERM GOAL #3   Title  Patient/s FOTO limitation score will improve by 10 points to exhibit improved functional abilities.    Time  4    Period  Weeks    Status  New      PT LONG TERM GOAL #4   Title  Patient will have pain no greater than 2/10 to encourage greater movement and activities.    Time  4    Period  Weeks    Status  New             Plan - 10/16/17 1434    Clinical Impression Statement  Patient is a 72 year old female who presents s/p Rt TKA 3/4/ 2019. She exhibits deficits in gait, endurance, balance,  AROM, strength, functional ability and pain. Pt would benefit from skilled physical therapy to address the aforementioned deficits.    History and Personal Factors relevant to plan of care:  OA, chronic back pain, 8 back surgeries, sleep disturbances, pain    Clinical Presentation  Stable    Clinical Presentation due to:  AROM, MMT, FOTO, gait    Clinical Decision Making  Moderate    Rehab Potential  Good    PT Frequency  2x / week    PT Duration  4 weeks    PT Treatment/Interventions  Biofeedback;Cryotherapy;Electrical Stimulation;Ultrasound;Moist Heat;Iontophoresis 4mg /ml Dexamethasone;DME Instruction;Gait training;Stair training;Functional mobility training;Therapeutic activities;Therapeutic exercise;Patient/family education;Neuromuscular re-education;Balance training;Scar mobilization;Passive range of motion;Dry needling;Energy conservation;Taping    PT Next Visit Plan  advance HHPT HEP to be more wt bearing strengthening, calf and hamstring stretch, balance exercises    PT Home Exercise Plan  initial - HHPT HEP but with increased hold timesas patient not holding exercises    Consulted and Agree with Plan of Care  Patient       Patient will benefit from skilled therapeutic intervention in order to improve the following deficits and impairments:  Abnormal gait, Pain, Improper body mechanics, Decreased mobility, Decreased activity tolerance, Decreased endurance, Decreased balance, Decreased range of motion, Decreased strength, Difficulty walking  Visit Diagnosis: Difficulty in walking, not elsewhere classified  Unsteadiness on feet  Muscle weakness (generalized)  Acute pain of right knee  Problem List Patient Active Problem List   Diagnosis Date Noted  . Insomnia 12/13/2016  . Preventative health care 09/08/2014  . Ovarian cyst 08/13/2014  . Preoperative cardiovascular examination 08/13/2014  . Abnormal EKG 08/13/2014  . PVC's (premature ventricular contractions)   .  Fracture of left distal radius 12/30/2013  . Spinal stenosis of lumbar region 06/24/2013  . Atherosclerosis of aorta ([place]) 05/06/2013  . Osteopenia 03/06/2013  . Edema 11/05/2012  . Myalgia 04/11/2011  . Night sweats 12/27/2010  . Contact lens/glasses fitting 12/27/2010  . Menopause 12/27/2010  . Family history of breast cancer 12/27/2010  . ADJUSTMENT DISORDER WITH ANXIOUS MOOD 05/26/2010  . PULMONARY NODULE, LEFT UPPER LOBE 12/15/2009  . CONSTIPATION, DRUG INDUCED 08/19/2009  . KNEE PAIN, LEFT 09/13/2008  . THYROID NODULE, LEFT 05/26/2008  . GERD 05/26/2008  . INSOMNIA 05/06/2008  . Anemia 10/24/2007  . Hypothyroidism 08/12/2007  . Hyperlipidemia 08/12/2007  . TOBACCO ABUSE 08/12/2007  . Essential hypertension 08/12/2007  . OA (osteoarthritis) of knee 08/12/2007  . Hx of colonic polyps 08/28/2004    Floria Raveling. Hartnett-Rands, MS, PT [facility] [place] #[id number] 10/16/2017, 2:53 PM  [facility] [place] [address] [place], [place], [address] Phone: [phone]   Fax:  [phone]  Name: Joan [name] MRN: [id number] Date of Birth: August 16, 1945

## 2017-10-16 NOTE — Telephone Encounter (Signed)
10/16/17  pt said that she couldn't come Friday at 2:30 but I looked and nothing was available... I told patient I would add her to the wait list

## 2017-10-18 ENCOUNTER — Encounter (HOSPITAL_COMMUNITY): Payer: Medicare Other

## 2017-10-21 ENCOUNTER — Encounter (HOSPITAL_COMMUNITY): Payer: Self-pay

## 2017-10-21 ENCOUNTER — Ambulatory Visit (HOSPITAL_COMMUNITY): Payer: Medicare Other

## 2017-10-21 DIAGNOSIS — R262 Difficulty in walking, not elsewhere classified: Secondary | ICD-10-CM

## 2017-10-21 DIAGNOSIS — M6281 Muscle weakness (generalized): Secondary | ICD-10-CM

## 2017-10-21 DIAGNOSIS — M25561 Pain in right knee: Secondary | ICD-10-CM | POA: Diagnosis not present

## 2017-10-21 DIAGNOSIS — R2681 Unsteadiness on feet: Secondary | ICD-10-CM | POA: Diagnosis not present

## 2017-10-21 NOTE — Patient Instructions (Addendum)
Heel Raise: Bilateral (Standing)    Rise on balls of feet., switch to toe raise. Repeat 10____ times per set. Do 1____ sets per session. Do __2__ sessions per day.  http://orth.exer.us/39   Copyright  VHI. All rights reserved.  Calf Stretch    Hands on wall, shoulder height, slightly wider than shoulders, fingers up. Left foot ahead of right. Body straight (like a board); lean into wall by bending front knee. Keep right heel on floor and foot straight ahead. Hold _30__ seconds. Push away with arms. Repeat _2__ times. 2x/day  Copyright  VHI. All rights reserved.  Scar Tissue Massage    Place pad of fingertip on scar area. Apply steady downward pressure while moving in circular fashion. Use another fin-ger on top to assist. Repeat until entire scar has been covered. Repeat 10____ times. Do _2___ sessions per day.  Copyright  VHI. All rights reserved.  Hamstring Stretch - Supine    Lie on back, involved leg raised toward ceiling, towel around knee. Pull thigh toward chest until a stretch is felt on back of thigh. Hold for 20___ seconds. If possible, move towel up to calf and repeat.  Repeat _3__ times. Do 2___ times per day.  Copyright  VHI. All rights reserved.

## 2017-10-21 NOTE — Therapy (Signed)
[place], [place], [address] Phone: [phone]   Fax:  [phone]  Physical Therapy Treatment  Patient Details  Name: Joan [name] MRN: [id number] Date of Birth: 20-Apr-1946 Referring Provider: Gaynelle Arabian, MD   Encounter Date: 10/21/2017  PT End of Session - 10/21/17 0939    Visit Number  2    Number of Visits  8    Date for PT Re-Evaluation  11/13/17 mini-reassess 10/30/2017    Authorization Type  [employer] Medicare A[employer] Plan 1 HMO complete; no deduct; copay $40/visit, coins OOP 4400 - $804.24    Authorization Time Period  medical necessity, no auth, no ref    Authorization - Visit Number  2    Authorization - Number of Visits  10    PT Start Time  [phone]    PT Stop Time  1028    PT Time Calculation (min)  44 min    Activity Tolerance  Patient limited by pain    Behavior During Therapy  [facility] for tasks assessed/performed       Past Medical History:  Diagnosis Date  . Arthritis    "shoulders; wrist; probably spine" (06/24/2013)  . Chronic low back pain    followed by Dr Hardin Negus pain mgt  . Colon polyp   . Constipation   . Esophageal stricture   . Finger pain, left    2 fingers on left hand since wrist surgery  . GERD (gastroesophageal reflux disease)   . Heart murmur    "slight; not on RX" (06/24/2013)  . History of cardiac arrhythmia    cardiologist- Traci Turner  . Hx of colonic polyps 08/28/2004  . Hyperlipemia   . Hypertension   . Hypothyroidism   . Osteoarthritis of left knee    advanced  . PONV (postoperative nausea and vomiting)    Pt reports symptoms are the result of gallbladder and cholecystectomy, not anesthesia  . PVC's (premature ventricular contractions)   . Sleep apnea 1990's   "tested; tried mask; couldn't stand it; told me as long as I slept on my side I'd be ok" (06/24/2013)  . Thyroid nodule   . Tobacco abuse     Past Surgical History:  Procedure Laterality Date  .  ABDOMINAL HYSTERECTOMY  1988   "partial" (06/24/2013)  . ABDOMINAL HYSTERECTOMY     partial in 1988  . ANKLE SURGERY Left 1995   "tendon repair" (06/24/2013)  . BACK SURGERY     "think today was my 8th back OR" (06/24/2013)  . CERVICAL SPINE SURGERY  2012  . [place]  . CHOLECYSTECTOMY N/A 04/16/2013   Procedure: LAPAROSCOPIC CHOLECYSTECTOMY ;  Surgeon: Imogene Burn. Georgette Dover, MD;  Location: [facility];  Service: General;  Laterality: N/A;  . ELBOW SURGERY  1990's  . FOOT SURGERY Right 2012   SPUR REMOVED  . INFUSION PUMP IMPLANTATION  1990's   "implantablet morphine pump; took it out w/in 11 months  . KNEE ARTHROSCOPY Left 1991; ~ 1993  . LAPAROSCOPIC LYSIS OF ADHESIONS N/A 04/16/2013   Procedure: LAPAROSCOPIC LYSIS OF ADHESIONS;  Surgeon: Imogene Burn. Georgette Dover, MD;  [facility];  Service: General;  Laterality: N/A;  . LUMBAR FUSION     and rods  . LUMBAR LAMINECTOMY/DECOMPRESSION MICRODISCECTOMY N/A 11/28/2012   Procedure: DECOMPRESSIVE LUMBAR LAMINECTOMY LEVEL 1;  Surgeon: Elaina Hoops, MD;  Location: [facility];  Service: Neurosurgery;  Laterality: N/A;  DECOMPRESSIVE LUMBAR LAMINECTOMY LEVEL 1  .  ORIF DISTAL RADIUS FRACTURE Left 12/30/2013   dr Caralyn Guile  . ORIF WRIST FRACTURE Left 12/30/2013   Procedure: OPEN REDUCTION INTERNAL FIXATION (ORIF) LEFT WRIST FRACTURE AND REPAIR AS INDICATED;  Surgeon: Linna Hoff, MD;  Location: [facility];  Service: Orthopedics;  Laterality: Left;  . POSTERIOR FUSION LUMBAR SPINE  06/24/2013  . ROBOTIC ASSISTED SALPINGO OOPHERECTOMY Bilateral 08/20/2014   Procedure: ROBOTIC ASSISTED SALPINGO OOPHORECTOMY;  Surgeon: Daria Pastures, MD;  Location: [facility];  Service: Gynecology;  Laterality: Bilateral;  . SHOULDER ARTHROSCOPY Left 09/2011  . THYROIDECTOMY  2012  . TOTAL KNEE ARTHROPLASTY Right 09/09/2017   Procedure: RIGHT TOTAL KNEE ARTHROPLASTY;  Surgeon: Gaynelle Arabian, MD;  [facility];  Service: Orthopedics;  Laterality: Right;    There were  no vitals filed for this visit.  Subjective Assessment - 10/21/17 0938    Subjective  3-4/10 today, not too bad. Taking OTC pain meds now but needs them every 5 hours to sleep. Twisting on the Rt knee this morning and it really hurt. Standing to take a shower now. Has grab bars and feels safe.    Pertinent History   8 prior lumbar spine surgeries with metal plates and fusions with left sciatica, left wrist fracture    Limitations  House hold activities;Lifting;Walking;Sitting;Standing    How long can you sit comfortably?  30 minutes    How long can you stand comfortably?  15 minutes    How long can you walk comfortably?  15 minutes    Patient Stated Goals  get back to where I was before, get out of pain, get more rt knee bend, and be able to perform my regular activities    Currently in Pain?  Yes    Pain Score  4     Pain Location  Knee    Pain Orientation  Medial;Right    Pain Descriptors / Indicators  Throbbing;Sharp    Pain Type  Surgical pain    Pain Onset  More than a month ago    Pain Onset  More than a month ago                       [facility] Adult PT Treatment/Exercise - 10/21/17 0001      Exercises   Exercises  Knee/Hip      Knee/Hip Exercises: Stretches   Active Hamstring Stretch  Right;2 reps;30 seconds    Gastroc Stretch  Right;2 reps;30 seconds    Gastroc Stretch Limitations  slantboard      Knee/Hip Exercises: Standing   Heel Raises  Both;3 sets;10 reps    Heel Raises Limitations  toe raises    Knee Flexion  PROM;Right;10 reps    Knee Flexion Limitations  x10 knee drives 12" step      Knee/Hip Exercises: Supine   Knee Extension  AROM;Limitations    Knee Extension Limitations  6    Knee Flexion  AROM;Limitations    Knee Flexion Limitations  90      Manual Therapy   Manual Therapy  Joint mobilization;Soft tissue mobilization    Manual therapy comments  manual completed separate from all other exercise    Joint Mobilization  PA TF joint    Soft  tissue mobilization  scar up/down, ML, CW, CCW; patellar tendon, pes anserine, medial jt line          Balance Exercises - 10/21/17 0959      Balance Exercises: Standing   Standing Eyes Opened  Narrow base of support (BOS);Foam/compliant surface;2 reps;30 secs left and right    SLS  Eyes open;Solid surface;Intermittent upper extremity support;2 reps;30 secs    Rockerboard  Anterior/posterior;Lateral;EO;Other time (comment);UE support 1 minute each        PT Education - 10/21/17 1237    Education provided  Yes    Education Details  HEP advanced, clinical reasoning for progression of exercises, importance of HEP.       PT Short Term Goals - 10/16/17 1443      PT SHORT TERM GOAL #1   Title  pt independent in intial HEP    Time  2    Period  Weeks    Status  New    Target Date  10/30/17      PT SHORT TERM GOAL #2   Title  Increase Rt knee AROM extension to < 10 degrees and flexion to > 95 to improve gait    Baseline  13 to 86    Time  2    Period  Weeks    Status  New      PT SHORT TERM GOAL #3   Title  Patient will exhibit in in MMT by 1/2 grade for bilateral lower extremities to improve endurance and ability to walk > 2 blocks    Time  2    Period  Weeks    Status  New      PT SHORT TERM GOAL #4   Title  Patient will have a reduction in pain so she is able to return to using OTC pain medications.    Time  2    Period  Weeks    Status  New        PT Long Term Goals - 10/16/17 1447      PT LONG TERM GOAL #1   Title  pt independent with advanced HEP as necessary    Time  4    Period  Weeks    Status  New    Target Date  11/13/17      PT LONG TERM GOAL #2   Title  Increase Rt knee AROM extension to < 5 degrees and flexion to > 105 to improve gait    Time  4    Period  Weeks    Status  New      PT LONG TERM GOAL #3   Title  Patient/s FOTO limitation score will improve by 10 points to exhibit improved functional abilities.    Time  4    Period  Weeks     Status  New      PT LONG TERM GOAL #4   Title  Patient will have pain no greater than 2/10 to encourage greater movement and activities.    Time  4    Period  Weeks    Status  New            Plan - 10/21/17 0939    Clinical Impression Statement  Patient measured right knee extension to 6 and flexion to 90 post exercise and manual therapies. Patient's HEP advanced to include heel/tow raises, calf and hamstring stretches and scar massage. Patient continues to exhibit deficits in balance, ROM, strength and functional ability requiring skilled PT.     History and Personal Factors relevant to plan of care:  OA, chronic back pain, 8 back surgeries, sleep distrubances, pain    Clinical Presentation  Stable    Rehab Potential  Good  PT Frequency  2x / week    PT Duration  4 weeks    PT Treatment/Interventions  Biofeedback;Cryotherapy;Electrical Stimulation;Ultrasound;Moist Heat;Iontophoresis 4mg /ml Dexamethasone;DME Instruction;Gait training;Stair training;Functional mobility training;Therapeutic activities;Therapeutic exercise;Patient/family education;Neuromuscular re-education;Balance training;Scar mobilization;Passive range of motion;Dry needling;Energy conservation;Taping    PT Next Visit Plan  more wt bearing strengthening hip ext and abd, balance exercises, functional squats, STS    PT Home Exercise Plan  initial - HHPT HEP but with increased hold timesas patient not holding exercises; 10/21/17 - heel/toe raises, hamstring/calf stretches, scar massage    Consulted and Agree with Plan of Care  Patient       Patient will benefit from skilled therapeutic intervention in order to improve the following deficits and impairments:  Abnormal gait, Pain, Improper body mechanics, Decreased mobility, Decreased activity tolerance, Decreased endurance, Decreased balance, Decreased range of motion, Decreased strength, Difficulty walking  Visit Diagnosis: Difficulty in walking, not elsewhere  classified  Unsteadiness on feet  Muscle weakness (generalized)  Acute pain of right knee     Problem List Patient Active Problem List   Diagnosis Date Noted  . Insomnia 12/13/2016  . Preventative health care 09/08/2014  . Ovarian cyst 08/13/2014  . Preoperative cardiovascular examination 08/13/2014  . Abnormal EKG 08/13/2014  . PVC's (premature ventricular contractions)   . Fracture of left distal radius 12/30/2013  . Spinal stenosis of lumbar region 06/24/2013  . Atherosclerosis of aorta ([place]) 05/06/2013  . Osteopenia 03/06/2013  . Edema 11/05/2012  . Myalgia 04/11/2011  . Night sweats 12/27/2010  . Contact lens/glasses fitting 12/27/2010  . Menopause 12/27/2010  . Family history of breast cancer 12/27/2010  . ADJUSTMENT DISORDER WITH ANXIOUS MOOD 05/26/2010  . PULMONARY NODULE, LEFT UPPER LOBE 12/15/2009  . CONSTIPATION, DRUG INDUCED 08/19/2009  . KNEE PAIN, LEFT 09/13/2008  . THYROID NODULE, LEFT 05/26/2008  . GERD 05/26/2008  . INSOMNIA 05/06/2008  . Anemia 10/24/2007  . Hypothyroidism 08/12/2007  . Hyperlipidemia 08/12/2007  . TOBACCO ABUSE 08/12/2007  . Essential hypertension 08/12/2007  . OA (osteoarthritis) of knee 08/12/2007  . Hx of colonic polyps 08/28/2004    Joan Raveling. Hartnett-Rands, MS, PT [place] #[id number] 10/21/2017, 12:45 PM  [place] [address] [place], [place], [address] Phone: [phone]   Fax:  [phone]  Name: Joan [name] MRN: [id number] Date of Birth: 02/12/1946

## 2017-10-23 ENCOUNTER — Ambulatory Visit (HOSPITAL_COMMUNITY): Payer: Medicare Other

## 2017-10-23 ENCOUNTER — Encounter (HOSPITAL_COMMUNITY): Payer: Self-pay

## 2017-10-23 DIAGNOSIS — R262 Difficulty in walking, not elsewhere classified: Secondary | ICD-10-CM

## 2017-10-23 DIAGNOSIS — R2681 Unsteadiness on feet: Secondary | ICD-10-CM

## 2017-10-23 DIAGNOSIS — M6281 Muscle weakness (generalized): Secondary | ICD-10-CM | POA: Diagnosis not present

## 2017-10-23 DIAGNOSIS — M25561 Pain in right knee: Secondary | ICD-10-CM

## 2017-10-23 NOTE — Therapy (Signed)
[place], [place], [address] Phone: [phone]   Fax:  [phone]  Physical Therapy Treatment  Patient Details  Name: Joan [name] MRN: [id number] Date of Birth: 1946/03/11 Referring Provider: Gaynelle Arabian   Encounter Date: 10/23/2017  PT End of Session - 10/23/17 0942    Visit Number  3    Number of Visits  8    Date for PT Re-Evaluation  11/13/17 minireassess 10/30/17    Authorization Type  [employer] Medicare AARP Plan 1 HMO complete; no deduct; copay $40/visit, coins OOP 4400 - $804.24    Authorization Time Period  medical necessity, no auth, no ref; cert 5/40--> 0/02/6760    Authorization - Visit Number  3    Authorization - Number of Visits  10    PT Start Time  0940    PT Stop Time  9509    PT Time Calculation (min)  49 min    Activity Tolerance  Patient limited by pain;Patient tolerated treatment well    Behavior During Therapy  [facility] for tasks assessed/performed       Past Medical History:  Diagnosis Date  . Arthritis    "shoulders; wrist; probably spine" (06/24/2013)  . Chronic low back pain    followed by Dr Hardin Negus pain mgt  . Colon polyp   . Constipation   . Esophageal stricture   . Finger pain, left    2 fingers on left hand since wrist surgery  . GERD (gastroesophageal reflux disease)   . Heart murmur    "slight; not on RX" (06/24/2013)  . History of cardiac arrhythmia    cardiologist- Traci Turner  . Hx of colonic polyps 08/28/2004  . Hyperlipemia   . Hypertension   . Hypothyroidism   . Osteoarthritis of left knee    advanced  . PONV (postoperative nausea and vomiting)    Pt reports symptoms are the result of gallbladder and cholecystectomy, not anesthesia  . PVC's (premature ventricular contractions)   . Sleep apnea 1990's   "tested; tried mask; couldn't stand it; told me as long as I slept on my side I'd be ok" (06/24/2013)  . Thyroid nodule   . Tobacco abuse     Past Surgical  History:  Procedure Laterality Date  . ABDOMINAL HYSTERECTOMY  1988   "partial" (06/24/2013)  . ABDOMINAL HYSTERECTOMY     partial in 1988  . ANKLE SURGERY Left 1995   "tendon repair" (06/24/2013)  . BACK SURGERY     "think today was my 8th back OR" (06/24/2013)  . CERVICAL SPINE SURGERY  2012  . [place]  . CHOLECYSTECTOMY N/A 04/16/2013   Procedure: LAPAROSCOPIC CHOLECYSTECTOMY ;  Surgeon: Imogene Burn. Georgette Dover, MD;  [facility];  Service: General;  Laterality: N/A;  . ELBOW SURGERY  1990's  . FOOT SURGERY Right 2012   SPUR REMOVED  . INFUSION PUMP IMPLANTATION  1990's   "implantablet morphine pump; took it out w/in 11 months  . KNEE ARTHROSCOPY Left 1991; ~ 1993  . LAPAROSCOPIC LYSIS OF ADHESIONS N/A 04/16/2013   Procedure: LAPAROSCOPIC LYSIS OF ADHESIONS;  Surgeon: Imogene Burn. Georgette Dover, MD;  [facility];  Service: General;  Laterality: N/A;  . LUMBAR FUSION     and rods  . LUMBAR LAMINECTOMY/DECOMPRESSION MICRODISCECTOMY N/A 11/28/2012   Procedure: DECOMPRESSIVE LUMBAR LAMINECTOMY LEVEL 1;  Surgeon: Elaina Hoops, MD;  Location: [facility];  Service: Neurosurgery;  Laterality: N/A;  DECOMPRESSIVE LUMBAR  LAMINECTOMY LEVEL 1  . ORIF DISTAL RADIUS FRACTURE Left 12/30/2013   dr Caralyn Guile  . ORIF WRIST FRACTURE Left 12/30/2013   Procedure: OPEN REDUCTION INTERNAL FIXATION (ORIF) LEFT WRIST FRACTURE AND REPAIR AS INDICATED;  Surgeon: Linna Hoff, MD;  Location: [facility];  Service: Orthopedics;  Laterality: Left;  . POSTERIOR FUSION LUMBAR SPINE  06/24/2013  . ROBOTIC ASSISTED SALPINGO OOPHERECTOMY Bilateral 08/20/2014   Procedure: ROBOTIC ASSISTED SALPINGO OOPHORECTOMY;  Surgeon: Daria Pastures, MD;  Location: [facility];  Service: Gynecology;  Laterality: Bilateral;  . SHOULDER ARTHROSCOPY Left 09/2011  . THYROIDECTOMY  2012  . TOTAL KNEE ARTHROPLASTY Right 09/09/2017   Procedure: RIGHT TOTAL KNEE ARTHROPLASTY;  Surgeon: Gaynelle Arabian, MD;  [facility];  Service:  Orthopedics;  Laterality: Right;    There were no vitals filed for this visit.  Subjective Assessment - 10/23/17 0938    Subjective  Pt arrived with SPC, reports pain scale 5/10 Rt knee, reports increased pain Lt knee feels due to compensation waiting for surgery for over a year.  Reports complaince with HEP and has began a walking program 15 minutes daioy.     Patient Stated Goals  get back to where I was before, get out of pain, get more rt knee bend, and be able to perform my regular activities    Currently in Pain?  Yes    Pain Score  5     Pain Location  Knee    Pain Orientation  Right;Left    Pain Type  Surgical pain    Pain Onset  More than a month ago    Pain Frequency  Intermittent    Aggravating Factors   sitting too long, stand, walk    Pain Relieving Factors  higher level pain meds, ice, rubbing knee         [facility] PT Assessment - 10/23/17 0001      Assessment   Medical Diagnosis  R TKA     Referring Provider  Pilar Plate Aluisio    Onset Date/Surgical Date  09/09/17    Hand Dominance  Right    Next MD Visit  11/19/17      Precautions   Precautions  None                   [facility] Adult PT Treatment/Exercise - 10/23/17 0001      Knee/Hip Exercises: Stretches   Active Hamstring Stretch  3 reps;30 seconds;Limitations    Active Hamstring Stretch Limitations  standing 12in step    Knee: Self-Stretch to increase Flexion  Right;5 reps;10 seconds;Limitations    Knee: Self-Stretch Limitations  knee drive on 01UU step    Gastroc Stretch  Right;3 reps;30 seconds    Gastroc Stretch Limitations  slantboard      Knee/Hip Exercises: Standing   Heel Raises  15 reps    Heel Raises Limitations  toe raises    Rocker Board  2 minutes;Limitations    Rocker Board Limitations  lateral and DF/PF    SLS  Rt 12", Lt 8" max of 3    Other Standing Knee Exercises  hip extension and abduction 10x (cueing for form and mechanics, limited by LBP and hip pain with activities       Knee/Hip Exercises: Seated   Sit to Sand  5 reps;without UE support      Knee/Hip Exercises: Supine   Short Arc Quad Sets  15 reps    Knee Extension  AROM;Limitations    Knee  Extension Limitations  5    Knee Flexion  AROM;Limitations    Knee Flexion Limitations  89    Other Supine Knee/Hip Exercises  Limited tolerance for supine mobility related to back, required several pillows      Manual Therapy   Manual Therapy  Joint mobilization;Myofascial release    Manual therapy comments  manual completed separate from all other exercise    Joint Mobilization  Patella mobs all direction    Myofascial Release  proximal knee for pain control and scar tissue mobilization          Balance Exercises - 10/23/17 1024      Balance Exercises: Standing   Standing Eyes Opened  Narrow base of support (BOS);Foam/compliant surface;2 reps;30 secs    Tandem Stance  Eyes open;2 reps;30 secs    SLS  Eyes open;Solid surface;Intermittent upper extremity support;2 reps;30 secs Lt 8", Rt 12"    Rockerboard  Anterior/posterior;Lateral;EO;Other time (comment);UE support 2 minutes each          PT Short Term Goals - 10/16/17 1443      PT SHORT TERM GOAL #1   Title  pt independent in intial HEP    Time  2    Period  Weeks    Status  New    Target Date  10/30/17      PT SHORT TERM GOAL #2   Title  Increase Rt knee AROM extension to < 10 degrees and flexion to > 95 to improve gait    Baseline  13 to 86    Time  2    Period  Weeks    Status  New      PT SHORT TERM GOAL #3   Title  Patient will exhibit in in MMT by 1/2 grade for bilateral lower extremities to improve endurance and ability to walk > 2 blocks    Time  2    Period  Weeks    Status  New      PT SHORT TERM GOAL #4   Title  Patient will have a reduction in pain so she is able to return to using OTC pain medications.    Time  2    Period  Weeks    Status  New        PT Long Term Goals - 10/16/17 1447      PT LONG TERM GOAL #1    Title  pt independent with advanced HEP as necessary    Time  4    Period  Weeks    Status  New    Target Date  11/13/17      PT LONG TERM GOAL #2   Title  Increase Rt knee AROM extension to < 5 degrees and flexion to > 105 to improve gait    Time  4    Period  Weeks    Status  New      PT LONG TERM GOAL #3   Title  Patient/s FOTO limitation score will improve by 10 points to exhibit improved functional abilities.    Time  4    Period  Weeks    Status  New      PT LONG TERM GOAL #4   Title  Patient will have pain no greater than 2/10 to encourage greater movement and activities.    Time  4    Period  Weeks    Status  New  Plan - 10/23/17 1104    Clinical Impression Statement  Session focus with knee mobility with additional hip strengthening and balance training this session.  Pt limited by pain through session in LBP, hip and Bil knee pain requiring seated rest breaks for pain control.  Pt with difficulty in supine position as well due to LBP.  AROM 5-89 degrees.      Rehab Potential  Good    PT Frequency  2x / week    PT Duration  4 weeks    PT Treatment/Interventions  Biofeedback;Cryotherapy;Electrical Stimulation;Ultrasound;Moist Heat;Iontophoresis 4mg /ml Dexamethasone;DME Instruction;Gait training;Stair training;Functional mobility training;Therapeutic activities;Therapeutic exercise;Patient/family education;Neuromuscular re-education;Balance training;Scar mobilization;Passive range of motion;Dry needling;Energy conservation;Taping    PT Next Visit Plan  more wt bearing strengthening hip ext and abd, balance exercises, functional squats, STS.  Add standing TKE and continue knee mobility exercises    PT Home Exercise Plan  initial - HHPT HEP but with increased hold timesas patient not holding exercises; 10/21/17 - heel/toe raises, hamstring/calf stretches, scar massage       Patient will benefit from skilled therapeutic intervention in order to improve the  following deficits and impairments:  Abnormal gait, Pain, Improper body mechanics, Decreased mobility, Decreased activity tolerance, Decreased endurance, Decreased balance, Decreased range of motion, Decreased strength, Difficulty walking  Visit Diagnosis: Difficulty in walking, not elsewhere classified  Unsteadiness on feet  Muscle weakness (generalized)  Acute pain of right knee     Problem List Patient Active Problem List   Diagnosis Date Noted  . Insomnia 12/13/2016  . Preventative health care 09/08/2014  . Ovarian cyst 08/13/2014  . Preoperative cardiovascular examination 08/13/2014  . Abnormal EKG 08/13/2014  . PVC's (premature ventricular contractions)   . Fracture of left distal radius 12/30/2013  . Spinal stenosis of lumbar region 06/24/2013  . Atherosclerosis of aorta ([place]) 05/06/2013  . Osteopenia 03/06/2013  . Edema 11/05/2012  . Myalgia 04/11/2011  . Night sweats 12/27/2010  . Contact lens/glasses fitting 12/27/2010  . Menopause 12/27/2010  . Family history of breast cancer 12/27/2010  . ADJUSTMENT DISORDER WITH ANXIOUS MOOD 05/26/2010  . PULMONARY NODULE, LEFT UPPER LOBE 12/15/2009  . CONSTIPATION, DRUG INDUCED 08/19/2009  . KNEE PAIN, LEFT 09/13/2008  . THYROID NODULE, LEFT 05/26/2008  . GERD 05/26/2008  . INSOMNIA 05/06/2008  . Anemia 10/24/2007  . Hypothyroidism 08/12/2007  . Hyperlipidemia 08/12/2007  . TOBACCO ABUSE 08/12/2007  . Essential hypertension 08/12/2007  . OA (osteoarthritis) of knee 08/12/2007  . Hx of colonic polyps 08/28/2004   Ihor Austin, LPTA; [place]  Aldona Lento 10/23/2017, 12:09 PM  [place], [place], [address] Phone: [phone]   Fax:  [phone]  Name: RHYLEI MCQUAIG MRN: [id number] Date of Birth: [birth year]

## 2017-10-25 ENCOUNTER — Ambulatory Visit (HOSPITAL_COMMUNITY): Payer: Medicare Other

## 2017-10-25 ENCOUNTER — Encounter (HOSPITAL_COMMUNITY): Payer: Self-pay

## 2017-10-25 DIAGNOSIS — M25561 Pain in right knee: Secondary | ICD-10-CM

## 2017-10-25 DIAGNOSIS — R262 Difficulty in walking, not elsewhere classified: Secondary | ICD-10-CM | POA: Diagnosis not present

## 2017-10-25 DIAGNOSIS — R2681 Unsteadiness on feet: Secondary | ICD-10-CM | POA: Diagnosis not present

## 2017-10-25 DIAGNOSIS — M6281 Muscle weakness (generalized): Secondary | ICD-10-CM

## 2017-10-25 NOTE — Therapy (Signed)
[place], [place], [address] Phone: [phone]   Fax:  [phone]  Physical Therapy Treatment  Patient Details  Name: Joan [name] MRN: [id number] Date of Birth: 1945/12/23 Referring Provider: Gaynelle Arabian   Encounter Date: 10/25/2017  PT End of Session - 10/25/17 1403    Visit Number  4    Number of Visits  8    Date for PT Re-Evaluation  11/13/17 minireassess 10/30/17    Authorization Type  [employer] Medicare AARP Plan 1 HMO complete; no deduct; copay $40/visit, coins OOP [id number] - $804.24    Authorization Time Period  medical necessity, no auth, no ref; cert 6/62--> 03/12/7653    Authorization - Visit Number  4    Authorization - Number of Visits  10    PT Start Time  6503    PT Stop Time  1429    PT Time Calculation (min)  41 min    Activity Tolerance  Patient limited by pain;Patient tolerated treatment well    Behavior During Therapy  [facility] for tasks assessed/performed       Past Medical History:  Diagnosis Date  . Arthritis    "shoulders; wrist; probably spine" (06/24/2013)  . Chronic low back pain    followed by Dr Hardin Negus pain mgt  . Colon polyp   . Constipation   . Esophageal stricture   . Finger pain, left    2 fingers on left hand since wrist surgery  . GERD (gastroesophageal reflux disease)   . Heart murmur    "slight; not on RX" (06/24/2013)  . History of cardiac arrhythmia    cardiologist- Traci Turner  . Hx of colonic polyps 08/28/2004  . Hyperlipemia   . Hypertension   . Hypothyroidism   . Osteoarthritis of left knee    advanced  . PONV (postoperative nausea and vomiting)    Pt reports symptoms are the result of gallbladder and cholecystectomy, not anesthesia  . PVC's (premature ventricular contractions)   . Sleep apnea 1990's   "tested; tried mask; couldn't stand it; told me as long as I slept on my side I'd be ok" (06/24/2013)  . Thyroid nodule   . Tobacco abuse     Past Surgical  History:  Procedure Laterality Date  . ABDOMINAL HYSTERECTOMY  1988   "partial" (06/24/2013)  . ABDOMINAL HYSTERECTOMY     partial in 1988  . ANKLE SURGERY Left 1995   "tendon repair" (06/24/2013)  . BACK SURGERY     "think today was my 8th back OR" (06/24/2013)  . CERVICAL SPINE SURGERY  2012  . [facility]  . CHOLECYSTECTOMY N/A 04/16/2013   Procedure: LAPAROSCOPIC CHOLECYSTECTOMY ;  Surgeon: Imogene Burn. Georgette Dover, MD;  Location: [facility];  Service: General;  Laterality: N/A;  . ELBOW SURGERY  1990's  . FOOT SURGERY Right 2012   SPUR REMOVED  . INFUSION PUMP IMPLANTATION  1990's   "implantablet morphine pump; took it out w/in 11 months  . KNEE ARTHROSCOPY Left 1991; ~ 1993  . LAPAROSCOPIC LYSIS OF ADHESIONS N/A 04/16/2013   Procedure: LAPAROSCOPIC LYSIS OF ADHESIONS;  Surgeon: Imogene Burn. Georgette Dover, MD;  Location: [facility];  Service: General;  Laterality: N/A;  . LUMBAR FUSION     and rods  . LUMBAR LAMINECTOMY/DECOMPRESSION MICRODISCECTOMY N/A 11/28/2012   Procedure: DECOMPRESSIVE LUMBAR LAMINECTOMY LEVEL 1;  Surgeon: Elaina Hoops, MD;  Location: [facility] [facility];  Service: Neurosurgery;  Laterality: N/A;  DECOMPRESSIVE LUMBAR  LAMINECTOMY LEVEL 1  . ORIF DISTAL RADIUS FRACTURE Left 12/30/2013   dr Caralyn Guile  . ORIF WRIST FRACTURE Left 12/30/2013   Procedure: OPEN REDUCTION INTERNAL FIXATION (ORIF) LEFT WRIST FRACTURE AND REPAIR AS INDICATED;  Surgeon: Linna Hoff, MD;  Location: [place];  Service: Orthopedics;  Laterality: Left;  . POSTERIOR FUSION LUMBAR SPINE  06/24/2013  . ROBOTIC ASSISTED SALPINGO OOPHERECTOMY Bilateral 08/20/2014   Procedure: ROBOTIC ASSISTED SALPINGO OOPHORECTOMY;  Surgeon: Daria Pastures, MD;  Location: [facility];  Service: Gynecology;  Laterality: Bilateral;  . SHOULDER ARTHROSCOPY Left 09/2011  . THYROIDECTOMY  2012  . TOTAL KNEE ARTHROPLASTY Right 09/09/2017   Procedure: RIGHT TOTAL KNEE ARTHROPLASTY;  Surgeon: Gaynelle Arabian, MD;  [facility];  Service:  Orthopedics;  Laterality: Right;    There were no vitals filed for this visit.  Subjective Assessment - 10/25/17 1355    Subjective  Pt expressed increased Lt LBP following last session with sciatic symptoms that have been resolved today.  Current pain scale 5/10 burning and sharp pain with flexion    Pertinent History   8 prior lumbar spine surgeries with metal plates and fusions with left sciatica, left wrist fracture    Patient Stated Goals  get back to where I was before, get out of pain, get more rt knee bend, and be able to perform my regular activities    Currently in Pain?  Yes    Pain Score  5     Pain Location  Knee    Pain Orientation  Left    Pain Descriptors / Indicators  Sharp;Burning with flexion    Pain Type  Surgical pain    Pain Onset  More than a month ago    Pain Frequency  Intermittent    Aggravating Factors   sitting too long, stand, walk    Pain Relieving Factors  higher level pain meds, ice, rubbing knee     Pain Score  4    Pain Location  Back    Pain Orientation  Lower;Left    Pain Type  Chronic pain    Pain Onset  More than a month ago    Pain Frequency  Constant                       [facility] Adult PT Treatment/Exercise - 10/25/17 0001      Knee/Hip Exercises: Stretches   Active Hamstring Stretch  3 reps;30 seconds;Limitations    Active Hamstring Stretch Limitations  standing 12in step    Knee: Self-Stretch to increase Flexion  Right;5 reps;10 seconds;Limitations    Knee: Self-Stretch Limitations  knee drive on 99ME step    Gastroc Stretch  Right;3 reps;30 seconds    Gastroc Stretch Limitations  slantboard      Knee/Hip Exercises: Aerobic   Recumbent Bike  seat 13 initially rocking then full revolution with cueing to reduce hip compensation x 3 min      Knee/Hip Exercises: Standing   Heel Raises  15 reps    Heel Raises Limitations  toe raises    Terminal Knee Extension  10 reps;Theraband    Theraband Level (Terminal Knee Extension)   Level 2 (Red) 5" hold times    Rocker Board  2 minutes;Limitations    Rocker Board Limitations  lateral and DF/PF    SLS  held today due to reports of increased LBP with SLS     Other Standing Knee Exercises  abduction 10x  Knee/Hip Exercises: Supine   Heel Slides  10 reps    Knee Extension  AROM;Limitations    Knee Extension Limitations  5    Knee Flexion  AROM;Limitations    Knee Flexion Limitations  101 was 89 last session    Other Supine Knee/Hip Exercises  Limited tolerance for supine mobility related to back, likes wedge and 2 pillows               PT Short Term Goals - 10/16/17 1443      PT SHORT TERM GOAL #1   Title  pt independent in intial HEP    Time  2    Period  Weeks    Status  New    Target Date  10/30/17      PT SHORT TERM GOAL #2   Title  Increase Rt knee AROM extension to < 10 degrees and flexion to > 95 to improve gait    Baseline  13 to 86    Time  2    Period  Weeks    Status  New      PT SHORT TERM GOAL #3   Title  Patient will exhibit in in MMT by 1/2 grade for bilateral lower extremities to improve endurance and ability to walk > 2 blocks    Time  2    Period  Weeks    Status  New      PT SHORT TERM GOAL #4   Title  Patient will have a reduction in pain so she is able to return to using OTC pain medications.    Time  2    Period  Weeks    Status  New        PT Long Term Goals - 10/16/17 1447      PT LONG TERM GOAL #1   Title  pt independent with advanced HEP as necessary    Time  4    Period  Weeks    Status  New    Target Date  11/13/17      PT LONG TERM GOAL #2   Title  Increase Rt knee AROM extension to < 5 degrees and flexion to > 105 to improve gait    Time  4    Period  Weeks    Status  New      PT LONG TERM GOAL #3   Title  Patient/s FOTO limitation score will improve by 10 points to exhibit improved functional abilities.    Time  4    Period  Weeks    Status  New      PT LONG TERM GOAL #4   Title   Patient will have pain no greater than 2/10 to encourage greater movement and activities.    Time  4    Period  Weeks    Status  New            Plan - 10/25/17 1415    Clinical Impression Statement  Pt reports increased LBP following last session, pt with limited tolerance for supine (wedge and 2 pillows for comfort) and SLS positions, monitored through session.  Added bike for mobility and standing TKE for quad strengthening.  Pt tolerated well to standing exercises with no reports of increased pain.  Improved knee mobility at EOS, AROM 5-101 degrees (was 5-89 degrees last session).    Rehab Potential  Good    PT Frequency  2x / week    PT Duration  4 weeks    PT Treatment/Interventions  Biofeedback;Cryotherapy;Electrical Stimulation;Ultrasound;Moist Heat;Iontophoresis 4mg /ml Dexamethasone;DME Instruction;Gait training;Stair training;Functional mobility training;Therapeutic activities;Therapeutic exercise;Patient/family education;Neuromuscular re-education;Balance training;Scar mobilization;Passive range of motion;Dry needling;Energy conservation;Taping    PT Next Visit Plan  Continue with knee mobility and complete BLE weight bearing strength training for LBP tolerance.  Next session add balance activities and functional squats.  Hold on SLS activities for LBP control.  Wedge with 2 pillows for tolerance with supine position    PT Home Exercise Plan  initial - HHPT HEP but with increased hold timesas patient not holding exercises; 10/21/17 - heel/toe raises, hamstring/calf stretches, scar massage       Patient will benefit from skilled therapeutic intervention in order to improve the following deficits and impairments:  Abnormal gait, Pain, Improper body mechanics, Decreased mobility, Decreased activity tolerance, Decreased endurance, Decreased balance, Decreased range of motion, Decreased strength, Difficulty walking  Visit Diagnosis: Difficulty in walking, not elsewhere  classified  Unsteadiness on feet  Muscle weakness (generalized)  Acute pain of right knee     Problem List Patient Active Problem List   Diagnosis Date Noted  . Insomnia 12/13/2016  . Preventative health care 09/08/2014  . Ovarian cyst 08/13/2014  . Preoperative cardiovascular examination 08/13/2014  . Abnormal EKG 08/13/2014  . PVC's (premature ventricular contractions)   . Fracture of left distal radius 12/30/2013  . Spinal stenosis of lumbar region 06/24/2013  . Atherosclerosis of aorta ([place]) 05/06/2013  . Osteopenia 03/06/2013  . Edema 11/05/2012  . Myalgia 04/11/2011  . Night sweats 12/27/2010  . Contact lens/glasses fitting 12/27/2010  . Menopause 12/27/2010  . Family history of breast cancer 12/27/2010  . ADJUSTMENT DISORDER WITH ANXIOUS MOOD 05/26/2010  . PULMONARY NODULE, LEFT UPPER LOBE 12/15/2009  . CONSTIPATION, DRUG INDUCED 08/19/2009  . KNEE PAIN, LEFT 09/13/2008  . THYROID NODULE, LEFT 05/26/2008  . GERD 05/26/2008  . INSOMNIA 05/06/2008  . Anemia 10/24/2007  . Hypothyroidism 08/12/2007  . Hyperlipidemia 08/12/2007  . TOBACCO ABUSE 08/12/2007  . Essential hypertension 08/12/2007  . OA (osteoarthritis) of knee 08/12/2007  . Hx of colonic polyps 08/28/2004   Ihor Austin, LPTA; [place]  Aldona Lento 10/25/2017, 3:47 PM  [place], [place], [address] Phone: [phone]   Fax:  [phone]  Name: Joan [name] MRN: [id number] Date of Birth: Sep 19, 1945

## 2017-10-28 ENCOUNTER — Ambulatory Visit (HOSPITAL_COMMUNITY): Payer: Medicare Other | Admitting: Physical Therapy

## 2017-10-28 DIAGNOSIS — M25561 Pain in right knee: Secondary | ICD-10-CM | POA: Diagnosis not present

## 2017-10-28 DIAGNOSIS — R262 Difficulty in walking, not elsewhere classified: Secondary | ICD-10-CM

## 2017-10-28 DIAGNOSIS — R2681 Unsteadiness on feet: Secondary | ICD-10-CM | POA: Diagnosis not present

## 2017-10-28 DIAGNOSIS — M6281 Muscle weakness (generalized): Secondary | ICD-10-CM

## 2017-10-28 NOTE — Therapy (Signed)
[place], [place], [address] Phone: [phone]   Fax:  [phone]  Physical Therapy Treatment  Patient Details  Name: Joan [name] MRN: [id number] Date of Birth: [birth year] Referring Provider: Gaynelle Arabian   Encounter Date: 10/28/2017  PT End of Session - 10/28/17 1445    Visit Number  5    Number of Visits  8    Date for PT Re-Evaluation  11/13/17 minireassess 10/30/17    Authorization Type  [employer] Medicare AARP Plan 1 HMO complete; no deduct; copay $40/visit, coins OOP 4400 - $804.24    Authorization Time Period  medical necessity, no auth, no ref; cert 1/60--> 01/08/7105    Authorization - Visit Number  5    Authorization - Number of Visits  10    PT Start Time  0950    PT Stop Time  1035    PT Time Calculation (min)  45 min    Activity Tolerance  Patient limited by pain;Patient tolerated treatment well    Behavior During Therapy  [facility] for tasks assessed/performed       Past Medical History:  Diagnosis Date  . Arthritis    "shoulders; wrist; probably spine" (06/24/2013)  . Chronic low back pain    followed by Dr Hardin Negus pain mgt  . Colon polyp   . Constipation   . Esophageal stricture   . Finger pain, left    2 fingers on left hand since wrist surgery  . GERD (gastroesophageal reflux disease)   . Heart murmur    "slight; not on RX" (06/24/2013)  . History of cardiac arrhythmia    cardiologist- Traci Turner  . Hx of colonic polyps 08/28/2004  . Hyperlipemia   . Hypertension   . Hypothyroidism   . Osteoarthritis of left knee    advanced  . PONV (postoperative nausea and vomiting)    Pt reports symptoms are the result of gallbladder and cholecystectomy, not anesthesia  . PVC's (premature ventricular contractions)   . Sleep apnea 1990's   "tested; tried mask; couldn't stand it; told me as long as I slept on my side I'd be ok" (06/24/2013)  . Thyroid nodule   . Tobacco abuse     Past Surgical  History:  Procedure Laterality Date  . ABDOMINAL HYSTERECTOMY  1988   "partial" (06/24/2013)  . ABDOMINAL HYSTERECTOMY     partial in 1988  . ANKLE SURGERY Left 1995   "tendon repair" (06/24/2013)  . BACK SURGERY     "think today was my 8th back OR" (06/24/2013)  . CERVICAL SPINE SURGERY  2012  . [place]  . CHOLECYSTECTOMY N/A 04/16/2013   Procedure: LAPAROSCOPIC CHOLECYSTECTOMY ;  Surgeon: Imogene Burn. Georgette Dover, MD;  [facility];  Service: General;  Laterality: N/A;  . ELBOW SURGERY  1990's  . FOOT SURGERY Right 2012   SPUR REMOVED  . INFUSION PUMP IMPLANTATION  1990's   "implantablet morphine pump; took it out w/in 11 months  . KNEE ARTHROSCOPY Left 1991; ~ 1993  . LAPAROSCOPIC LYSIS OF ADHESIONS N/A 04/16/2013   Procedure: LAPAROSCOPIC LYSIS OF ADHESIONS;  Surgeon: Imogene Burn. Georgette Dover, MD;  Location: [facility];  Service: General;  Laterality: N/A;  . LUMBAR FUSION     and rods  . LUMBAR LAMINECTOMY/DECOMPRESSION MICRODISCECTOMY N/A 11/28/2012   Procedure: DECOMPRESSIVE LUMBAR LAMINECTOMY LEVEL 1;  Surgeon: Elaina Hoops, MD;  Location: [facility];  Service: Neurosurgery;  Laterality: N/A;  DECOMPRESSIVE LUMBAR  LAMINECTOMY LEVEL 1  . ORIF DISTAL RADIUS FRACTURE Left 12/30/2013   dr Caralyn Guile  . ORIF WRIST FRACTURE Left 12/30/2013   Procedure: OPEN REDUCTION INTERNAL FIXATION (ORIF) LEFT WRIST FRACTURE AND REPAIR AS INDICATED;  Surgeon: Linna Hoff, MD;  Location: [place];  Service: Orthopedics;  Laterality: Left;  . POSTERIOR FUSION LUMBAR SPINE  06/24/2013  . ROBOTIC ASSISTED SALPINGO OOPHERECTOMY Bilateral 08/20/2014   Procedure: ROBOTIC ASSISTED SALPINGO OOPHORECTOMY;  Surgeon: Daria Pastures, MD;  Location: [facility];  Service: Gynecology;  Laterality: Bilateral;  . SHOULDER ARTHROSCOPY Left 09/2011  . THYROIDECTOMY  2012  . TOTAL KNEE ARTHROPLASTY Right 09/09/2017   Procedure: RIGHT TOTAL KNEE ARTHROPLASTY;  Surgeon: Gaynelle Arabian, MD;  [facility];  Service:  Orthopedics;  Laterality: Right;    There were no vitals filed for this visit.  Subjective Assessment - 10/28/17 1005    Subjective  Pt states her LT knee cap and sciatic pain into her lateral Lt LE is almost as bad as her Rt knee pain. Rt knee pain 6/10 today. Lt is 5/10    Currently in Pain?  Yes    Pain Score  6     Pain Location  Knee    Pain Orientation  Right                       [facility] Adult PT Treatment/Exercise - 10/28/17 0001      Knee/Hip Exercises: Stretches   Active Hamstring Stretch  3 reps;30 seconds;Limitations    Active Hamstring Stretch Limitations  standing 12in step    Knee: Self-Stretch to increase Flexion  Right;5 reps;10 seconds;Limitations    Knee: Self-Stretch Limitations  knee drive on 25ZD step    Gastroc Stretch  Right;3 reps;30 seconds    Gastroc Stretch Limitations  slantboard      Knee/Hip Exercises: Aerobic   Recumbent Bike  seat 13 initially rocking then full revolution with cueing to reduce hip compensation x 5 min      Knee/Hip Exercises: Supine   Short Arc Quad Sets  15 reps    Heel Slides  10 reps    Knee Extension  AROM;Limitations    Knee Extension Limitations  3    Knee Flexion  AROM;Limitations    Knee Flexion Limitations  106    Other Supine Knee/Hip Exercises  Limited tolerance for supine mobility related to back, likes wedge and 2 pillows      Manual Therapy   Manual Therapy  Joint mobilization;Myofascial release;Soft tissue mobilization    Manual therapy comments  manual completed separate from all other exercise    Joint Mobilization  Patella mobs all direction    Soft tissue mobilization  scar up/down, ML, CW, CCW; patellar tendon, pes anserine, medial jt line    Myofascial Release  proximal knee for pain control and scar tissue mobilization               PT Short Term Goals - 10/16/17 1443      PT SHORT TERM GOAL #1   Title  pt independent in intial HEP    Time  2    Period  Weeks    Status  New     Target Date  10/30/17      PT SHORT TERM GOAL #2   Title  Increase Rt knee AROM extension to < 10 degrees and flexion to > 95 to improve gait    Baseline  13 to 86  Time  2    Period  Weeks    Status  New      PT SHORT TERM GOAL #3   Title  Patient will exhibit in in MMT by 1/2 grade for bilateral lower extremities to improve endurance and ability to walk > 2 blocks    Time  2    Period  Weeks    Status  New      PT SHORT TERM GOAL #4   Title  Patient will have a reduction in pain so she is able to return to using OTC pain medications.    Time  2    Period  Weeks    Status  New        PT Long Term Goals - 10/16/17 1447      PT LONG TERM GOAL #1   Title  pt independent with advanced HEP as necessary    Time  4    Period  Weeks    Status  New    Target Date  11/13/17      PT LONG TERM GOAL #2   Title  Increase Rt knee AROM extension to < 5 degrees and flexion to > 105 to improve gait    Time  4    Period  Weeks    Status  New      PT LONG TERM GOAL #3   Title  Patient/s FOTO limitation score will improve by 10 points to exhibit improved functional abilities.    Time  4    Period  Weeks    Status  New      PT LONG TERM GOAL #4   Title  Patient will have pain no greater than 2/10 to encourage greater movement and activities.    Time  4    Period  Weeks    Status  New            Plan - 10/28/17 1445    Clinical Impression Statement  PT request limited standing exercises today as she is hurting in bilateral LE's.  Focuses majority of session on manual today as pt with adhesions perimeter of knee and poor patellar mobility.  Able to loosen tissue and improve ROM/decrease pain at EOS.  Pt with reduction to 3/10 in Rt knee at EOS (was 6/10) and mobility improved each direction to 3-106 (was 5-101 last session).  Pt in improved spirits at EOS.    Rehab Potential  Good    PT Frequency  2x / week    PT Duration  4 weeks    PT Treatment/Interventions   Biofeedback;Cryotherapy;Electrical Stimulation;Ultrasound;Moist Heat;Iontophoresis 4mg /ml Dexamethasone;DME Instruction;Gait training;Stair training;Functional mobility training;Therapeutic activities;Therapeutic exercise;Patient/family education;Neuromuscular re-education;Balance training;Scar mobilization;Passive range of motion;Dry needling;Energy conservation;Taping    PT Next Visit Plan  Continue with knee mobility and complete BLE weight bearing strength training to LBP tolerance.  If symptoms improved, add balance activities and functional squats next session.  Hold on SLS activities for LBP control.  Wedge with 2 pillows for tolerance with supine position    PT Home Exercise Plan  initial - HHPT HEP but with increased hold timesas patient not holding exercises; 10/21/17 - heel/toe raises, hamstring/calf stretches, scar massage       Patient will benefit from skilled therapeutic intervention in order to improve the following deficits and impairments:  Abnormal gait, Pain, Improper body mechanics, Decreased mobility, Decreased activity tolerance, Decreased endurance, Decreased balance, Decreased range of motion, Decreased strength, Difficulty walking  Visit Diagnosis: Difficulty  in walking, not elsewhere classified  Unsteadiness on feet  Muscle weakness (generalized)  Acute pain of right knee     Problem List Patient Active Problem List   Diagnosis Date Noted  . Insomnia 12/13/2016  . Preventative health care 09/08/2014  . Ovarian cyst 08/13/2014  . Preoperative cardiovascular examination 08/13/2014  . Abnormal EKG 08/13/2014  . PVC's (premature ventricular contractions)   . Fracture of left distal radius 12/30/2013  . Spinal stenosis of lumbar region 06/24/2013  . Atherosclerosis of aorta ([place]) 05/06/2013  . Osteopenia 03/06/2013  . Edema 11/05/2012  . Myalgia 04/11/2011  . Night sweats 12/27/2010  . Contact lens/glasses fitting 12/27/2010  . Menopause 12/27/2010  . Family  history of breast cancer 12/27/2010  . ADJUSTMENT DISORDER WITH ANXIOUS MOOD 05/26/2010  . PULMONARY NODULE, LEFT UPPER LOBE 12/15/2009  . CONSTIPATION, DRUG INDUCED 08/19/2009  . KNEE PAIN, LEFT 09/13/2008  . THYROID NODULE, LEFT 05/26/2008  . GERD 05/26/2008  . INSOMNIA 05/06/2008  . Anemia 10/24/2007  . Hypothyroidism 08/12/2007  . Hyperlipidemia 08/12/2007  . TOBACCO ABUSE 08/12/2007  . Essential hypertension 08/12/2007  . OA (osteoarthritis) of knee 08/12/2007  . Hx of colonic polyps 08/28/2004   Teena Irani, PTA/CLT [phone]  Teena Irani 10/28/2017, 2:49 PM  [place] [address] [place], [place], [address] Phone: [phone]   Fax:  [phone]  Name: Joan [name] MRN: [id number] Date of Birth: 19-Mar-1946

## 2017-10-30 ENCOUNTER — Ambulatory Visit (HOSPITAL_COMMUNITY): Payer: Medicare Other | Admitting: Physical Therapy

## 2017-10-30 DIAGNOSIS — R2681 Unsteadiness on feet: Secondary | ICD-10-CM | POA: Diagnosis not present

## 2017-10-30 DIAGNOSIS — R262 Difficulty in walking, not elsewhere classified: Secondary | ICD-10-CM | POA: Diagnosis not present

## 2017-10-30 DIAGNOSIS — M25561 Pain in right knee: Secondary | ICD-10-CM

## 2017-10-30 DIAGNOSIS — M6281 Muscle weakness (generalized): Secondary | ICD-10-CM | POA: Diagnosis not present

## 2017-10-30 NOTE — Therapy (Signed)
[facility] [address] [place], [place], [address] Phone: [phone]   Fax:  [phone]  Physical Therapy Treatment Progress Note Reporting Period 10/16/17  To  10/30/17  See note below for Objective Data and Assessment of Progress/Goals.       Patient Details  Name: Joan [name] MRN: [id number] Date of Birth: [birth year] Referring Provider: Wynelle Link   Encounter Date: 10/30/2017  PT End of Session - 10/30/17 1708    Visit Number  6    Number of Visits  8    Date for PT Re-Evaluation  11/13/17 minireassess 10/30/17 completed on 4/24    Authorization Type  [employer] Medicare AARP Plan 1 HMO complete; no deduct; copay $40/visit, coins OOP 4400 - $804.24    Authorization Time Period  medical necessity, no auth, no ref; cert 0/34--> 01/09/2594    Authorization - Visit Number  6    Authorization - Number of Visits  10    PT Start Time  6387    PT Stop Time  1655    PT Time Calculation (min)  50 min    Activity Tolerance  Patient limited by pain;Patient tolerated treatment well    Behavior During Therapy  [facility] for tasks assessed/performed       Past Medical History:  Diagnosis Date  . Arthritis    "shoulders; wrist; probably spine" (06/24/2013)  . Chronic low back pain    followed by Dr Hardin Negus pain mgt  . Colon polyp   . Constipation   . Esophageal stricture   . Finger pain, left    2 fingers on left hand since wrist surgery  . GERD (gastroesophageal reflux disease)   . Heart murmur    "slight; not on RX" (06/24/2013)  . History of cardiac arrhythmia    cardiologist- Traci Turner  . Hx of colonic polyps 08/28/2004  . Hyperlipemia   . Hypertension   . Hypothyroidism   . Osteoarthritis of left knee    advanced  . PONV (postoperative nausea and vomiting)    Pt reports symptoms are the result of gallbladder and cholecystectomy, not anesthesia  . PVC's (premature ventricular contractions)   . Sleep apnea 1990's   "tested; tried  mask; couldn't stand it; told me as long as I slept on my side I'd be ok" (06/24/2013)  . Thyroid nodule   . Tobacco abuse     Past Surgical History:  Procedure Laterality Date  . ABDOMINAL HYSTERECTOMY  1988   "partial" (06/24/2013)  . ABDOMINAL HYSTERECTOMY     partial in 1988  . ANKLE SURGERY Left 1995   "tendon repair" (06/24/2013)  . BACK SURGERY     "think today was my 8th back OR" (06/24/2013)  . CERVICAL SPINE SURGERY  2012  . [place]  . CHOLECYSTECTOMY N/A 04/16/2013   Procedure: LAPAROSCOPIC CHOLECYSTECTOMY ;  Surgeon: Imogene Burn. Georgette Dover, MD;  Location: [facility];  Service: General;  Laterality: N/A;  . ELBOW SURGERY  1990's  . FOOT SURGERY Right 2012   SPUR REMOVED  . INFUSION PUMP IMPLANTATION  1990's   "implantablet morphine pump; took it out w/in 11 months  . KNEE ARTHROSCOPY Left 1991; ~ 1993  . LAPAROSCOPIC LYSIS OF ADHESIONS N/A 04/16/2013   Procedure: LAPAROSCOPIC LYSIS OF ADHESIONS;  Surgeon: Imogene Burn. Georgette Dover, MD;  [facility]: [facility];  Service: General;  Laterality: N/A;  . LUMBAR FUSION     and rods  . LUMBAR LAMINECTOMY/DECOMPRESSION MICRODISCECTOMY N/A 11/28/2012  Procedure: DECOMPRESSIVE LUMBAR LAMINECTOMY LEVEL 1;  Surgeon: Elaina Hoops, MD;  Location: [facility];  Service: Neurosurgery;  Laterality: N/A;  DECOMPRESSIVE LUMBAR LAMINECTOMY LEVEL 1  . ORIF DISTAL RADIUS FRACTURE Left 12/30/2013   dr Caralyn Guile  . ORIF WRIST FRACTURE Left 12/30/2013   Procedure: OPEN REDUCTION INTERNAL FIXATION (ORIF) LEFT WRIST FRACTURE AND REPAIR AS INDICATED;  Surgeon: Linna Hoff, MD;  Location: [facility];  Service: Orthopedics;  Laterality: Left;  . POSTERIOR FUSION LUMBAR SPINE  06/24/2013  . ROBOTIC ASSISTED SALPINGO OOPHERECTOMY Bilateral 08/20/2014   Procedure: ROBOTIC ASSISTED SALPINGO OOPHORECTOMY;  Surgeon: Daria Pastures, MD;  Location: [facility];  Service: Gynecology;  Laterality: Bilateral;  . SHOULDER ARTHROSCOPY Left 09/2011  . THYROIDECTOMY  2012  . TOTAL  KNEE ARTHROPLASTY Right 09/09/2017   Procedure: RIGHT TOTAL KNEE ARTHROPLASTY;  Surgeon: Gaynelle Arabian, MD;  [facility];  Service: Orthopedics;  Laterality: Right;    There were no vitals filed for this visit.  Subjective Assessment - 10/30/17 1608    Subjective  Pt states her knee feels a little better than it did her last session.  Currently 6/10 Rt knee, Lt feels a little better also.     Currently in Pain?  Yes    Pain Score  6     Pain Location  Knee    Pain Orientation  Right    Pain Descriptors / Indicators  Burning         [facility] PT Assessment - 10/30/17 0001      Assessment   Medical Diagnosis  R TKA     Referring Provider  Aluisio    Next MD Visit  11/19/17      AROM   Right/Left Knee  Right    Right Knee Extension  3    Right Knee Flexion  106      Strength   Right Hip Flexion  4+/5    Right Hip Extension  -- pt declined to lay on stomach for testing    Right Hip ABduction  4+/5    Left Hip Flexion  4+/5    Left Hip Extension  -- refused prone    Left Hip ABduction  3+/5 in available ROM    Right Knee Flexion  5/5    Right Knee Extension  4+/5    Left Knee Flexion  5/5    Left Knee Extension  4+/5                   [facility] Adult PT Treatment/Exercise - 10/30/17 0001      Knee/Hip Exercises: Stretches   Active Hamstring Stretch  3 reps;30 seconds;Limitations    Active Hamstring Stretch Limitations  standing 12in step    Knee: Self-Stretch to increase Flexion  Right;5 reps;10 seconds;Limitations    Knee: Self-Stretch Limitations  knee drive on 11ET step               PT Short Term Goals - 10/30/17 1629      PT SHORT TERM GOAL #1   Title  pt independent in intial HEP    Time  2    Period  Weeks    Status  Achieved      PT SHORT TERM GOAL #2   Title  Increase Rt knee AROM extension to < 10 degrees and flexion to > 95 to improve gait    Baseline  13 to 86  4/24: 3-106    Time  2  Period  Weeks    Status  Achieved      PT  SHORT TERM GOAL #3   Title  Patient will exhibit in in MMT by 1/2 grade for bilateral lower extremities to improve endurance and ability to walk > 2 blocks    Baseline  4/24:  walking 1, 1/4 blocks around her apartment complex    Time  2    Period  Weeks    Status  Partially Met      PT SHORT TERM GOAL #4   Title  Patient will have a reduction in pain so she is able to return to using OTC pain medications.    Time  2    Period  Weeks    Status  On-going        PT Long Term Goals - 10/30/17 1635      PT LONG TERM GOAL #1   Title  pt independent with advanced HEP as necessary    Time  4    Period  Weeks    Status  On-going      PT LONG TERM GOAL #2   Title  Increase Rt knee AROM extension to < 5 degrees and flexion to > 105 to improve gait    Baseline  4/24: 3-106    Time  4    Period  Weeks    Status  Achieved      PT LONG TERM GOAL #3   Title  Patient/s FOTO limitation score will improve by 10 points to exhibit improved functional abilities.    Time  4    Period  Weeks    Status  Unable to assess      PT LONG TERM GOAL #4   Title  Patient will have pain no greater than 2/10 to encourage greater movement and activities.    Time  4    Period  Weeks    Status  On-going            Plan - 10/30/17 1708    Clinical Impression Statement  Overall improved from last session with decreased pain and further increased flexion following manual to 110 degrees (AAROM).  AROM remains at 3-106 today.  Decreased adhesions noted with manual and improved patellar mobiltiy.  Pt has met 2/4 STG's and progressing well towards LTG's.  Worked on gait without AD at [place] with minor antalgia noted.      Rehab Potential  Good    PT Frequency  2x / week    PT Duration  4 weeks    PT Treatment/Interventions  Biofeedback;Cryotherapy;Electrical Stimulation;Ultrasound;Moist Heat;Iontophoresis 23m/ml Dexamethasone;DME Instruction;Gait training;Stair training;Functional mobility  training;Therapeutic activities;Therapeutic exercise;Patient/family education;Neuromuscular re-education;Balance training;Scar mobilization;Passive range of motion;Dry needling;Energy conservation;Taping    PT Next Visit Plan  Continue with knee mobility and complete BLE weight bearing strength training to LBP tolerance.  If symptoms improved, add balance activities and functional squats next session.  Hold on SLS activities for LBP control.  Wedge with 2 pillows for tolerance with supine position    PT Home Exercise Plan  initial - HHPT HEP but with increased hold timesas patient not holding exercises; 10/21/17 - heel/toe raises, hamstring/calf stretches, scar massage       Patient will benefit from skilled therapeutic intervention in order to improve the following deficits and impairments:  Abnormal gait, Pain, Improper body mechanics, Decreased mobility, Decreased activity tolerance, Decreased endurance, Decreased balance, Decreased range of motion, Decreased strength, Difficulty walking  Visit Diagnosis: Difficulty in walking,  not elsewhere classified  Unsteadiness on feet  Muscle weakness (generalized)  Acute pain of right knee     Problem List Patient Active Problem List   Diagnosis Date Noted  . Insomnia 12/13/2016  . Preventative health care 09/08/2014  . Ovarian cyst 08/13/2014  . Preoperative cardiovascular examination 08/13/2014  . Abnormal EKG 08/13/2014  . PVC's (premature ventricular contractions)   . Fracture of left distal radius 12/30/2013  . Spinal stenosis of lumbar region 06/24/2013  . Atherosclerosis of aorta ([place]) 05/06/2013  . Osteopenia 03/06/2013  . Edema 11/05/2012  . Myalgia 04/11/2011  . Night sweats 12/27/2010  . Contact lens/glasses fitting 12/27/2010  . Menopause 12/27/2010  . Family history of breast cancer 12/27/2010  . ADJUSTMENT DISORDER WITH ANXIOUS MOOD 05/26/2010  . PULMONARY NODULE, LEFT UPPER LOBE 12/15/2009  . CONSTIPATION, DRUG INDUCED  08/19/2009  . KNEE PAIN, LEFT 09/13/2008  . THYROID NODULE, LEFT 05/26/2008  . GERD 05/26/2008  . INSOMNIA 05/06/2008  . Anemia 10/24/2007  . Hypothyroidism 08/12/2007  . Hyperlipidemia 08/12/2007  . TOBACCO ABUSE 08/12/2007  . Essential hypertension 08/12/2007  . OA (osteoarthritis) of knee 08/12/2007  . Hx of colonic polyps 08/28/2004   Teena Irani, PTA/CLT [phone]  Teena Irani 10/30/2017, 5:12 PM  [facility] [address] [place], [place], [address] Phone: [phone]   Fax:  [phone]  Name: Joan [name] MRN: [id number] Date of Birth: [birth year]

## 2017-11-01 ENCOUNTER — Ambulatory Visit (HOSPITAL_COMMUNITY): Payer: Medicare Other

## 2017-11-01 ENCOUNTER — Encounter (HOSPITAL_COMMUNITY): Payer: Self-pay

## 2017-11-01 ENCOUNTER — Encounter

## 2017-11-01 DIAGNOSIS — M6281 Muscle weakness (generalized): Secondary | ICD-10-CM

## 2017-11-01 DIAGNOSIS — R262 Difficulty in walking, not elsewhere classified: Secondary | ICD-10-CM

## 2017-11-01 DIAGNOSIS — M25561 Pain in right knee: Secondary | ICD-10-CM | POA: Diagnosis not present

## 2017-11-01 DIAGNOSIS — R2681 Unsteadiness on feet: Secondary | ICD-10-CM | POA: Diagnosis not present

## 2017-11-01 NOTE — Therapy (Signed)
[facility] [place], [place], [address] Phone: [phone]   Fax:  [phone]  Physical Therapy Treatment  Patient Details  Name: Joan [name] MRN: [id number] Date of Birth: 1946-02-08 Referring Provider: Gaynelle Arabian, MD   Encounter Date: 11/01/2017  PT End of Session - 11/01/17 1427    Visit Number  7    Number of Visits  8    Date for PT Re-Evaluation  11/13/17 Minireassess completed visit #6 10/30/17    Authorization Type  [employer] Medicare AARP Plan 1 HMO complete; no deduct; copay $40/visit, coins OOP 4400 - $804.24    Authorization Time Period  medical necessity, no auth, no ref; cert 3/55--> 03/15/4162    Authorization - Visit Number  7    Authorization - Number of Visits  10    PT Start Time  1350    PT Stop Time  8453 last 5' on bike, no charge    PT Time Calculation (min)  44 min    Activity Tolerance  Patient limited by pain;Patient tolerated treatment well    Behavior During Therapy  [facility] for tasks assessed/performed       Past Medical History:  Diagnosis Date  . Arthritis    "shoulders; wrist; probably spine" (06/24/2013)  . Chronic low back pain    followed by Dr Hardin Negus pain mgt  . Colon polyp   . Constipation   . Esophageal stricture   . Finger pain, left    2 fingers on left hand since wrist surgery  . GERD (gastroesophageal reflux disease)   . Heart murmur    "slight; not on RX" (06/24/2013)  . History of cardiac arrhythmia    cardiologist- Traci Turner  . Hx of colonic polyps 08/28/2004  . Hyperlipemia   . Hypertension   . Hypothyroidism   . Osteoarthritis of left knee    advanced  . PONV (postoperative nausea and vomiting)    Pt reports symptoms are the result of gallbladder and cholecystectomy, not anesthesia  . PVC's (premature ventricular contractions)   . Sleep apnea 1990's   "tested; tried mask; couldn't stand it; told me as long as I slept on my side I'd be ok" (06/24/2013)  .  Thyroid nodule   . Tobacco abuse     Past Surgical History:  Procedure Laterality Date  . ABDOMINAL HYSTERECTOMY  1988   "partial" (06/24/2013)  . ABDOMINAL HYSTERECTOMY     partial in 1988  . ANKLE SURGERY Left 1995   "tendon repair" (06/24/2013)  . BACK SURGERY     "think today was my 8th back OR" (06/24/2013)  . CERVICAL SPINE SURGERY  2012  . [place]  . CHOLECYSTECTOMY N/A 04/16/2013   Procedure: LAPAROSCOPIC CHOLECYSTECTOMY ;  Surgeon: Imogene Burn. Georgette Dover, MD;  [facility];  Service: General;  Laterality: N/A;  . ELBOW SURGERY  1990's  . FOOT SURGERY Right 2012   SPUR REMOVED  . INFUSION PUMP IMPLANTATION  1990's   "implantablet morphine pump; took it out w/in 11 months  . KNEE ARTHROSCOPY Left 1991; ~ 1993  . LAPAROSCOPIC LYSIS OF ADHESIONS N/A 04/16/2013   Procedure: LAPAROSCOPIC LYSIS OF ADHESIONS;  Surgeon: Imogene Burn. Georgette Dover, MD;  [facility];  Service: General;  Laterality: N/A;  . LUMBAR FUSION     and rods  . LUMBAR LAMINECTOMY/DECOMPRESSION MICRODISCECTOMY N/A 11/28/2012   Procedure: DECOMPRESSIVE LUMBAR LAMINECTOMY LEVEL 1;  Surgeon: Elaina Hoops, MD;  Location: [facility];  Service: Neurosurgery;  Laterality: N/A;  DECOMPRESSIVE LUMBAR LAMINECTOMY LEVEL 1  . ORIF DISTAL RADIUS FRACTURE Left 12/30/2013   dr Caralyn Guile  . ORIF WRIST FRACTURE Left 12/30/2013   Procedure: OPEN REDUCTION INTERNAL FIXATION (ORIF) LEFT WRIST FRACTURE AND REPAIR AS INDICATED;  Surgeon: Linna Hoff, MD;  Location: [facility];  Service: Orthopedics;  Laterality: Left;  . POSTERIOR FUSION LUMBAR SPINE  06/24/2013  . ROBOTIC ASSISTED SALPINGO OOPHERECTOMY Bilateral 08/20/2014   Procedure: ROBOTIC ASSISTED SALPINGO OOPHORECTOMY;  Surgeon: Daria Pastures, MD;  Location: [facility];  Service: Gynecology;  Laterality: Bilateral;  . SHOULDER ARTHROSCOPY Left 09/2011  . THYROIDECTOMY  2012  . TOTAL KNEE ARTHROPLASTY Right 09/09/2017   Procedure: RIGHT TOTAL KNEE ARTHROPLASTY;  Surgeon:  Gaynelle Arabian, MD;  [facility];  Service: Orthopedics;  Laterality: Right;    There were no vitals filed for this visit.  Subjective Assessment - 11/01/17 1352    Subjective  Pt stated her knee is feeling okay today, applied ice prior session, current pain scale 5/10 Bil knee pain.    Pertinent History   8 prior lumbar spine surgeries with metal plates and fusions with left sciatica, left wrist fracture    Patient Stated Goals  get back to where I was before, get out of pain, get more rt knee bend, and be able to perform my regular activities    Currently in Pain?  Yes    Pain Score  5     Pain Location  Knee    Pain Orientation  Right    Pain Descriptors / Indicators  Sore;Aching    Pain Type  Surgical pain    Pain Onset  More than a month ago    Pain Frequency  Intermittent    Aggravating Factors   sitting too long, stand, walk    Pain Relieving Factors  higher level pain meds, ice, rubbing knee         [facility] PT Assessment - 11/01/17 0001      Assessment   Medical Diagnosis  R TKA     Referring Provider  Gaynelle Arabian, MD    Next MD Visit  11/19/17                   [facility] Adult PT Treatment/Exercise - 11/01/17 0001      Knee/Hip Exercises: Stretches   Active Hamstring Stretch  3 reps;30 seconds;Limitations    Active Hamstring Stretch Limitations  standing 12in step    Knee: Self-Stretch to increase Flexion  Right;5 reps;10 seconds;Limitations    Knee: Self-Stretch Limitations  knee drive on 16XI step    Gastroc Stretch  Right;3 reps;30 seconds    Gastroc Stretch Limitations  slantboard      Knee/Hip Exercises: Aerobic   Recumbent Bike  seat 10 full revolution x5 min; no charge      Knee/Hip Exercises: Standing   Functional Squat  10 reps;Limitations    Functional Squat Limitations  chair behind and cueing for Writer Board  2 minutes;Limitations    Rocker Board Limitations  lateral and DF/PF    Other Standing Knee Exercises  sidestep  2RT inside // bars      Knee/Hip Exercises: Supine   Short Arc Quad Sets  15 reps    Heel Slides  10 reps    Knee Extension  AROM;Limitations    Knee Extension Limitations  3    Knee Flexion  AROM;Limitations    Knee  Flexion Limitations  106      Manual Therapy   Manual Therapy  Joint mobilization;Myofascial release;Soft tissue mobilization    Manual therapy comments  manual completed separate from all other exercise    Joint Mobilization  Patella mobs all direction    Soft tissue mobilization  scar up/down, ML, CW, CCW; patellar tendon, pes anserine, medial jt line    Myofascial Release  proximal knee for pain control and scar tissue mobilization          Balance Exercises - 11/01/17 1402      Balance Exercises: Standing   Standing Eyes Opened  Narrow base of support (BOS);Foam/compliant surface;30 secs no HHA    Tandem Stance  Eyes open;Foam/compliant surface;2 reps;30 secs    Rockerboard  Lateral 2 min     Sidestepping  -- 2RT inside // bars          PT Short Term Goals - 10/30/17 1629      PT SHORT TERM GOAL #1   Title  pt independent in intial HEP    Time  2    Period  Weeks    Status  Achieved      PT SHORT TERM GOAL #2   Title  Increase Rt knee AROM extension to < 10 degrees and flexion to > 95 to improve gait    Baseline  13 to 86  4/24: 3-106    Time  2    Period  Weeks    Status  Achieved      PT SHORT TERM GOAL #3   Title  Patient will exhibit in in MMT by 1/2 grade for bilateral lower extremities to improve endurance and ability to walk > 2 blocks    Baseline  4/24:  walking 1, 1/4 blocks around her apartment complex    Time  2    Period  Weeks    Status  Partially Met      PT SHORT TERM GOAL #4   Title  Patient will have a reduction in pain so she is able to return to using OTC pain medications.    Time  2    Period  Weeks    Status  On-going        PT Long Term Goals - 10/30/17 1635      PT LONG TERM GOAL #1   Title  pt independent  with advanced HEP as necessary    Time  4    Period  Weeks    Status  On-going      PT LONG TERM GOAL #2   Title  Increase Rt knee AROM extension to < 5 degrees and flexion to > 105 to improve gait    Baseline  4/24: 3-106    Time  4    Period  Weeks    Status  Achieved      PT LONG TERM GOAL #3   Title  Patient/s FOTO limitation score will improve by 10 points to exhibit improved functional abilities.    Time  4    Period  Weeks    Status  Unable to assess      PT LONG TERM GOAL #4   Title  Patient will have pain no greater than 2/10 to encourage greater movement and activities.    Time  4    Period  Weeks    Status  On-going            Plan - 11/01/17 1716  Clinical Impression Statement  Continued session focus with knee mobility and additional balance activities.  Added sidestep for glut med strengthening and improve SLS balance in pain free range.  Continues wiht manual techqnieus to address adhesions to assist with patella mobility.  Gait wihtout AD through session with minor antalgic mechanics noted.      Rehab Potential  Good    PT Frequency  2x / week    PT Duration  4 weeks    PT Treatment/Interventions  Biofeedback;Cryotherapy;Electrical Stimulation;Ultrasound;Moist Heat;Iontophoresis 37m/ml Dexamethasone;DME Instruction;Gait training;Stair training;Functional mobility training;Therapeutic activities;Therapeutic exercise;Patient/family education;Neuromuscular re-education;Balance training;Scar mobilization;Passive range of motion;Dry needling;Energy conservation;Taping    PT Next Visit Plan  Continue with knee mobility and complete BLE weight bearing strength training to LBP tolerance.  If symptoms improved, add balance activities and functional squats next session.  Hold on SLS activities for LBP control.  Wedge with 2 pillows for tolerance with supine position    PT Home Exercise Plan  initial - HHPT HEP but with increased hold timesas patient not holding  exercises; 10/21/17 - heel/toe raises, hamstring/calf stretches, scar massage       Patient will benefit from skilled therapeutic intervention in order to improve the following deficits and impairments:  Abnormal gait, Pain, Improper body mechanics, Decreased mobility, Decreased activity tolerance, Decreased endurance, Decreased balance, Decreased range of motion, Decreased strength, Difficulty walking  Visit Diagnosis: Difficulty in walking, not elsewhere classified  Unsteadiness on feet  Muscle weakness (generalized)  Acute pain of right knee     Problem List Patient Active Problem List   Diagnosis Date Noted  . Insomnia 12/13/2016  . Preventative health care 09/08/2014  . Ovarian cyst 08/13/2014  . Preoperative cardiovascular examination 08/13/2014  . Abnormal EKG 08/13/2014  . PVC's (premature ventricular contractions)   . Fracture of left distal radius 12/30/2013  . Spinal stenosis of lumbar region 06/24/2013  . [place] 05/[place]  . Osteopenia 03/06/2013  . Edema 11/05/2012  . Myalgia 04/11/2011  . Night sweats 12/27/2010  . Contact lens/glasses fitting 12/27/2010  . Menopause 12/27/2010  . Family history of breast cancer 12/27/2010  . ADJUSTMENT DISORDER WITH ANXIOUS MOOD 05/26/2010  . PULMONARY NODULE, LEFT UPPER LOBE 12/15/2009  . CONSTIPATION, DRUG INDUCED 08/19/2009  . KNEE PAIN, LEFT 09/13/2008  . THYROID NODULE, LEFT 05/26/2008  . GERD 05/26/2008  . INSOMNIA 05/06/2008  . Anemia 10/24/2007  . Hypothyroidism 08/12/2007  . Hyperlipidemia 08/12/2007  . TOBACCO ABUSE 08/12/2007  . Essential hypertension 08/12/2007  . OA (osteoarthritis) of knee 08/12/2007  . Hx of colonic polyps 08/28/2004   CIhor Austin LPTA; CNoble CAldona Lento4/26/2019, 5:20 PM  [place] [address]Phone: [phone]  Fax:  [phone] Name: Joan SKYLESMRN: [id number] of Birth: [birth year]

## 2017-11-04 ENCOUNTER — Ambulatory Visit (HOSPITAL_COMMUNITY): Payer: Medicare Other | Admitting: Physical Therapy

## 2017-11-04 ENCOUNTER — Encounter (HOSPITAL_COMMUNITY): Payer: Self-pay | Admitting: Physical Therapy

## 2017-11-04 DIAGNOSIS — R2681 Unsteadiness on feet: Secondary | ICD-10-CM

## 2017-11-04 DIAGNOSIS — M25561 Pain in right knee: Secondary | ICD-10-CM | POA: Diagnosis not present

## 2017-11-04 DIAGNOSIS — M6281 Muscle weakness (generalized): Secondary | ICD-10-CM

## 2017-11-04 DIAGNOSIS — R262 Difficulty in walking, not elsewhere classified: Secondary | ICD-10-CM

## 2017-11-04 NOTE — Therapy (Signed)
[facility] [place], [place], [address] Phone: [phone]   Fax:  [phone]  Physical Therapy Treatment  Patient Details  Name: Joan [name] MRN: [id number] Date of Birth: [birth year] Referring Provider: Gaynelle Arabian    Encounter Date: 11/04/2017  PT End of Session - 11/04/17 1412    Visit Number  8    Number of Visits  16    Date for PT Re-Evaluation  12/04/17 Minireassess completed visit #6 10/30/17    Authorization Type  [employer] Medicare A[employer] Plan 1 HMO complete; no deduct; copay $40/visit, coins OOP 4400 - $804.24    Authorization Time Period  medical necessity, no auth, no ref; cert 2/35--> 3/61/4431    Authorization - Visit Number  8    Authorization - Number of Visits  16 requesting an additional 4 weeks to gain flexion     PT Start Time  1345    PT Stop Time  1430    PT Time Calculation (min)  45 min    Activity Tolerance  Patient limited by pain;Patient tolerated treatment well    Behavior During Therapy  [facility] for tasks assessed/performed       Past Medical History:  Diagnosis Date  . Arthritis    "shoulders; wrist; probably spine" (06/24/2013)  . Chronic low back pain    followed by Dr Hardin Negus pain mgt  . Colon polyp   . Constipation   . Esophageal stricture   . Finger pain, left    2 fingers on left hand since wrist surgery  . GERD (gastroesophageal reflux disease)   . Heart murmur    "slight; not on RX" (06/24/2013)  . History of cardiac arrhythmia    cardiologist- Traci Turner  . Hx of colonic polyps 08/28/2004  . Hyperlipemia   . Hypertension   . Hypothyroidism   . Osteoarthritis of left knee    advanced  . PONV (postoperative nausea and vomiting)    Pt reports symptoms are the result of gallbladder and cholecystectomy, not anesthesia  . PVC's (premature ventricular contractions)   . Sleep apnea 1990's   "tested; tried mask; couldn't stand it; told me as long as I slept on my side I'd be  ok" (06/24/2013)  . Thyroid nodule   . Tobacco abuse     Past Surgical History:  Procedure Laterality Date  . ABDOMINAL HYSTERECTOMY  1988   "partial" (06/24/2013)  . ABDOMINAL HYSTERECTOMY     partial in 1988  . ANKLE SURGERY Left 1995   "tendon repair" (06/24/2013)  . BACK SURGERY     "think today was my 8th back OR" (06/24/2013)  . CERVICAL SPINE SURGERY  2012  . [place]  . CHOLECYSTECTOMY N/A 04/16/2013   Procedure: LAPAROSCOPIC CHOLECYSTECTOMY ;  Surgeon: Imogene Burn. Georgette Dover, MD;  Location: [facility];  Service: General;  Laterality: N/A;  . ELBOW SURGERY  1990's  . FOOT SURGERY Right 2012   SPUR REMOVED  . INFUSION PUMP IMPLANTATION  1990's   "implantablet morphine pump; took it out w/in 11 months  . KNEE ARTHROSCOPY Left 1991; ~ 1993  . LAPAROSCOPIC LYSIS OF ADHESIONS N/A 04/16/2013   Procedure: LAPAROSCOPIC LYSIS OF ADHESIONS;  Surgeon: Imogene Burn. Georgette Dover, MD;  [facility];  Service: General;  Laterality: N/A;  . LUMBAR FUSION     and rods  . LUMBAR LAMINECTOMY/DECOMPRESSION MICRODISCECTOMY N/A 11/28/2012   Procedure: DECOMPRESSIVE LUMBAR LAMINECTOMY LEVEL 1;  Surgeon: Elaina Hoops, MD;  Location: [facility];  Service: Neurosurgery;  Laterality: N/A;  DECOMPRESSIVE LUMBAR LAMINECTOMY LEVEL 1  . ORIF DISTAL RADIUS FRACTURE Left 12/30/2013   dr Caralyn Guile  . ORIF WRIST FRACTURE Left 12/30/2013   Procedure: OPEN REDUCTION INTERNAL FIXATION (ORIF) LEFT WRIST FRACTURE AND REPAIR AS INDICATED;  Surgeon: Linna Hoff, MD;  Location: [place] [facility];  Service: Orthopedics;  Laterality: Left;  . POSTERIOR FUSION LUMBAR SPINE  06/24/2013  . ROBOTIC ASSISTED SALPINGO OOPHERECTOMY Bilateral 08/20/2014   Procedure: ROBOTIC ASSISTED SALPINGO OOPHORECTOMY;  Surgeon: Daria Pastures, MD;  Location: [facility];  Service: Gynecology;  Laterality: Bilateral;  . SHOULDER ARTHROSCOPY Left 09/2011  . THYROIDECTOMY  2012  . TOTAL KNEE ARTHROPLASTY Right 09/09/2017   Procedure: RIGHT TOTAL KNEE  ARTHROPLASTY;  Surgeon: Gaynelle Arabian, MD;  [facility];  Service: Orthopedics;  Laterality: Right;    There were no vitals filed for this visit.  Subjective Assessment - 11/04/17 1353    Subjective  PT states that the exercises are bothering her back.      Pertinent History   8 prior lumbar spine surgeries with metal plates and fusions with left sciatica, left wrist fracture    How long can you sit comfortably?  several hours ; was 30 minutes     How long can you stand comfortably?  30  minutes was 10 minutes     How long can you walk comfortably?  Walking with a cane still able to walk for 15 minutes stops due to back pain and left knee pain     Patient Stated Goals  get back to where I was before, get out of pain, get more rt knee bend, and be able to perform my regular activities    Pain Score  5  best:  0/10;  worst 10/10     Pain Location  Knee    Pain Orientation  Right    Pain Descriptors / Indicators  Aching    Pain Type  Acute pain    Pain Onset  More than a month ago    Aggravating Factors   weather     Pain Relieving Factors  ice     Pain Onset  More than a month ago         [facility] PT Assessment - 11/04/17 0001      Assessment   Medical Diagnosis  R TKA     Referring Provider  Hector Shade     Next MD Visit  11/19/17      Precautions   Precautions  None      Balance Screen   Has the patient fallen in the past 6 months  No    Has the patient had a decrease in activity level because of a fear of falling?   Yes    Is the patient reluctant to leave their home because of a fear of falling?   No      Prior Function   Level of Independence  Independent    Leisure  go shopping, crafts      AROM   Right/Left Knee  Right    Right Knee Extension  1 was 6    Right Knee Flexion  103 was 83       Strength   Right Hip Flexion  5/5 was 4-/5     Right Hip ABduction  4-/5 was 4-    Left Hip Flexion  5/5    Right Knee Flexion  5/5  Right Knee Extension  5/5 was  4+/5    Left Knee Flexion  5/5    Left Knee Extension  5/5 was 4+/5                   [facility] Adult PT Treatment/Exercise - 11/04/17 0001      Knee/Hip Exercises: Stretches   Knee: Self-Stretch to increase Flexion  Right;5 reps;10 seconds;Limitations    Knee: Self-Stretch Limitations  knee drive on 89FY step      Knee/Hip Exercises: Aerobic   Recumbent Bike  seat 9 full revolution x5 min; no charge      Knee/Hip Exercises: Supine   Quad Sets  10 reps    Heel Slides  10 reps    Knee Extension Limitations  1    Knee Flexion Limitations  103      heel raises; squats x s10          PT Short Term Goals - 11/04/17 1410      PT SHORT TERM GOAL #1   Title  pt independent in intial HEP    Time  2    Period  Weeks    Status  Achieved      PT SHORT TERM GOAL #2   Title  Increase Rt knee AROM extension to < 10 degrees and flexion to > 95 to improve gait    Baseline  13 to 86  4/24: 3-106    Time  2    Period  Weeks    Status  Achieved      PT SHORT TERM GOAL #3   Title  Patient will exhibit in in MMT by 1/2 grade for bilateral lower extremities to improve endurance and ability to walk > 2 blocks    Baseline  4/24:  walking 1, 1/4 blocks around her apartment complex    Time  2    Period  Weeks    Status  Partially Met      PT SHORT TERM GOAL #4   Title  Patient will have a reduction in pain so she is able to return to using OTC pain medications.    Baseline  will not be accomplished as pt normally takes prescription meds for her back; goal is to take 3 instead of 5    Time  2    Period  Weeks    Status  Revised        PT Long Term Goals - 11/04/17 1411      PT LONG TERM GOAL #1   Title  pt independent with advanced HEP as necessary    Time  4    Period  Weeks    Status  Achieved      PT LONG TERM GOAL #2   Title  Increase Rt knee AROM extension to < 5 degrees and flexion to > 105 to improve gait    Baseline  4/24: 3-106    Time  4    Period   Weeks    Status  Achieved      PT LONG TERM GOAL #3   Title  Patient/s FOTO limitation score will improve by 10 points to exhibit improved functional abilities.    Time  4    Period  Weeks    Status  Unable to assess      PT LONG TERM GOAL #4   Title  Patient will have pain no greater than 2/10 to encourage greater movement and activities.  Time  4    Period  Weeks    Status  Not Met            Plan - 11/04/17 1422    Clinical Impression Statement  PT reassessed.  She has made good gains in her ROM and strength but is still limited in flexion and gluteal medius and maximus strength affecting her functional ability.  Therapist is requesting 4 more weeks of treatment.     Rehab Potential  Good    PT Frequency  2x / week    PT Duration  8 weeks    PT Treatment/Interventions  Biofeedback;Cryotherapy;Electrical Stimulation;Ultrasound;Moist Heat;Iontophoresis 37m/ml Dexamethasone;DME Instruction;Gait training;Stair training;Functional mobility training;Therapeutic activities;Therapeutic exercise;Patient/family education;Neuromuscular re-education;Balance training;Scar mobilization;Passive range of motion;Dry needling;Energy conservation;Taping    PT Next Visit Plan  Continue with knee flexion and strengthening of her Rt gluteal medius and maximus mm .  If symptoms improved, add balance activities and functional squats next session.  Hold on SLS activities for LBP control.  Wedge with 2 pillows for tolerance with supine position    PT Home Exercise Plan  initial - HHPT HEP but with increased hold timesas patient not holding exercises; 10/21/17 - heel/toe raises, hamstring/calf stretches, scar massage       Patient will benefit from skilled therapeutic intervention in order to improve the following deficits and impairments:  Abnormal gait, Pain, Improper body mechanics, Decreased mobility, Decreased activity tolerance, Decreased endurance, Decreased balance, Decreased range of motion,  Decreased strength, Difficulty walking  Visit Diagnosis: Difficulty in walking, not elsewhere classified  Unsteadiness on feet  Muscle weakness (generalized)  Acute pain of right knee     Problem List Patient Active Problem List   Diagnosis Date Noted  . Insomnia 12/13/2016  . Preventative health care 09/08/2014  . Ovarian cyst 08/13/2014  . Preoperative cardiovascular examination 08/13/2014  . Abnormal EKG 08/13/2014  . PVC's (premature ventricular contractions)   . Fracture of left distal radius 12/30/2013  . Spinal stenosis of lumbar region 06/24/2013  . Atherosclerosis of aorta ([place] 05/06/2013  . Osteopenia 03/06/2013  . Edema 11/05/2012  . Myalgia 04/11/2011  . Night sweats 12/27/2010  . Contact lens/glasses fitting 12/27/2010  . Menopause 12/27/2010  . Family history of breast cancer 12/27/2010  . ADJUSTMENT DISORDER WITH ANXIOUS MOOD 05/26/2010  . PULMONARY NODULE, LEFT UPPER LOBE 12/15/2009  . CONSTIPATION, DRUG INDUCED 08/19/2009  . KNEE PAIN, LEFT 09/13/2008  . THYROID NODULE, LEFT 05/26/2008  . GERD 05/26/2008  . INSOMNIA 05/06/2008  . Anemia 10/24/2007  . Hypothyroidism 08/12/2007  . Hyperlipidemia 08/12/2007  . TOBACCO ABUSE 08/12/2007  . Essential hypertension 08/12/2007  . OA (osteoarthritis) of knee 08/12/2007  . Hx of colonic polyps 08/28/2004    CRayetta Humphrey PT CLT [phone]/29/2019, 2:26 PM  [address] [address]Phone: [phone]  Fax:  [phone] Name: Joan [name]: [id number] of Birth: 5April 25, 1947

## 2017-11-06 ENCOUNTER — Ambulatory Visit (HOSPITAL_COMMUNITY): Payer: Medicare Other | Attending: Orthopedic Surgery | Admitting: Physical Therapy

## 2017-11-06 DIAGNOSIS — M6281 Muscle weakness (generalized): Secondary | ICD-10-CM | POA: Diagnosis not present

## 2017-11-06 DIAGNOSIS — R262 Difficulty in walking, not elsewhere classified: Secondary | ICD-10-CM | POA: Insufficient documentation

## 2017-11-06 DIAGNOSIS — R2681 Unsteadiness on feet: Secondary | ICD-10-CM | POA: Insufficient documentation

## 2017-11-06 DIAGNOSIS — M25561 Pain in right knee: Secondary | ICD-10-CM | POA: Insufficient documentation

## 2017-11-06 NOTE — Therapy (Signed)
[facility] [place], [place], [address] Phone: [phone]   Fax:  [phone]  Physical Therapy Treatment  Patient Details  Name: Joan [name] MRN: [id number] Date of Birth: [birth year] Referring Provider: Gaynelle Arabian    Encounter Date: 11/06/2017  PT End of Session - 11/06/17 1358    Visit Number  9    Number of Visits  16    Date for PT Re-Evaluation  12/04/17 Minireassess completed visit #6 10/30/17    Authorization Type  [employer] Medicare AARP Plan 1 HMO complete; no deduct; copay $40/visit, coins OOP 4400 - $804.24    Authorization Time Period  medical necessity, no auth, no ref; cert 3/84--> 6/65/9935    Authorization - Visit Number  9    Authorization - Number of Visits  16 requesting an additional 4 weeks to gain flexion     PT Start Time  1305    PT Stop Time  1350    PT Time Calculation (min)  45 min    Activity Tolerance  Patient limited by pain;Patient tolerated treatment well    Behavior During Therapy  [facility] for tasks assessed/performed       Past Medical History:  Diagnosis Date  . Arthritis    "shoulders; wrist; probably spine" (06/24/2013)  . Chronic low back pain    followed by Dr Hardin Negus pain mgt  . Colon polyp   . Constipation   . Esophageal stricture   . Finger pain, left    2 fingers on left hand since wrist surgery  . GERD (gastroesophageal reflux disease)   . Heart murmur    "slight; not on RX" (06/24/2013)  . History of cardiac arrhythmia    cardiologist- Traci Turner  . Hx of colonic polyps 08/28/2004  . Hyperlipemia   . Hypertension   . Hypothyroidism   . Osteoarthritis of left knee    advanced  . PONV (postoperative nausea and vomiting)    Pt reports symptoms are the result of gallbladder and cholecystectomy, not anesthesia  . PVC's (premature ventricular contractions)   . Sleep apnea 1990's   "tested; tried mask; couldn't stand it; told me as long as I slept on my side I'd be ok"  (06/24/2013)  . Thyroid nodule   . Tobacco abuse     Past Surgical History:  Procedure Laterality Date  . ABDOMINAL HYSTERECTOMY  1988   "partial" (06/24/2013)  . ABDOMINAL HYSTERECTOMY     partial in 1988  . ANKLE SURGERY Left 1995   "tendon repair" (06/24/2013)  . BACK SURGERY     "think today was my 8th back OR" (06/24/2013)  . CERVICAL SPINE SURGERY  2012  . [employer]  . CHOLECYSTECTOMY N/A 04/16/2013   Procedure: LAPAROSCOPIC CHOLECYSTECTOMY ;  Surgeon: Imogene Burn. Georgette Dover, MD;  [facility];  Service: General;  Laterality: N/A;  . ELBOW SURGERY  1990's  . FOOT SURGERY Right 2012   SPUR REMOVED  . INFUSION PUMP IMPLANTATION  1990's   "implantablet morphine pump; took it out w/in 11 months  . KNEE ARTHROSCOPY Left 1991; ~ 1993  . LAPAROSCOPIC LYSIS OF ADHESIONS N/A 04/16/2013   Procedure: LAPAROSCOPIC LYSIS OF ADHESIONS;  Surgeon: Imogene Burn. Georgette Dover, MD;  [facility];  Service: General;  Laterality: N/A;  . LUMBAR FUSION     and rods  . LUMBAR LAMINECTOMY/DECOMPRESSION MICRODISCECTOMY N/A 11/28/2012   Procedure: DECOMPRESSIVE LUMBAR LAMINECTOMY LEVEL 1;  Surgeon: Elaina Hoops, MD;  Location: [facility];  Service: Neurosurgery;  Laterality: N/A;  DECOMPRESSIVE LUMBAR LAMINECTOMY LEVEL 1  . ORIF DISTAL RADIUS FRACTURE Left 12/30/2013   dr Caralyn Guile  . ORIF WRIST FRACTURE Left 12/30/2013   Procedure: OPEN REDUCTION INTERNAL FIXATION (ORIF) LEFT WRIST FRACTURE AND REPAIR AS INDICATED;  Surgeon: Linna Hoff, MD;  Location: [facility];  Service: Orthopedics;  Laterality: Left;  . POSTERIOR FUSION LUMBAR SPINE  06/24/2013  . ROBOTIC ASSISTED SALPINGO OOPHERECTOMY Bilateral 08/20/2014   Procedure: ROBOTIC ASSISTED SALPINGO OOPHORECTOMY;  Surgeon: Daria Pastures, MD;  Location: [facility];  Service: Gynecology;  Laterality: Bilateral;  . SHOULDER ARTHROSCOPY Left 09/2011  . THYROIDECTOMY  2012  . TOTAL KNEE ARTHROPLASTY Right 09/09/2017   Procedure: RIGHT TOTAL KNEE  ARTHROPLASTY;  Surgeon: Gaynelle Arabian, MD;  [facility];  Service: Orthopedics;  Laterality: Right;    There were no vitals filed for this visit.  Subjective Assessment - 11/06/17 1324    Subjective  Pt sttates her LT side is hurting worse.  Currently 4/10    Currently in Pain?  Yes    Pain Score  4     Pain Location  Knee    Pain Orientation  Right    Pain Descriptors / Indicators  Aching                       [facility] Adult PT Treatment/Exercise - 11/06/17 0001      Knee/Hip Exercises: Stretches   Knee: Self-Stretch to increase Flexion  Right;5 reps;10 seconds;Limitations    Knee: Self-Stretch Limitations  knee drive on 22QM step    Gastroc Stretch  Right;3 reps;30 seconds    Gastroc Stretch Limitations  slantboard      Knee/Hip Exercises: Aerobic   Recumbent Bike  seat 10 full revolution x5 min; no charge      Knee/Hip Exercises: Supine   Quad Sets  10 reps    Knee Extension Limitations  1    Knee Flexion Limitations  110      Manual Therapy   Manual Therapy  Myofascial release;Joint mobilization    Manual therapy comments  manual completed separate from all other exercise    Joint Mobilization  Patella mobs all direction    Soft tissue mobilization  scar up/down, ML, CW, CCW; patellar tendon, pes anserine, medial jt line    Myofascial Release  proximal knee for pain control and scar tissue mobilization               PT Short Term Goals - 11/04/17 1410      PT SHORT TERM GOAL #1   Title  pt independent in intial HEP    Time  2    Period  Weeks    Status  Achieved      PT SHORT TERM GOAL #2   Title  Increase Rt knee AROM extension to < 10 degrees and flexion to > 95 to improve gait    Baseline  13 to 86  4/24: 3-106    Time  2    Period  Weeks    Status  Achieved      PT SHORT TERM GOAL #3   Title  Patient will exhibit in in MMT by 1/2 grade for bilateral lower extremities to improve endurance and ability to walk > 2 blocks     Baseline  4/24:  walking 1, 1/4 blocks around her apartment complex    Time  2  Period  Weeks    Status  Partially Met      PT SHORT TERM GOAL #4   Title  Patient will have a reduction in pain so she is able to return to using OTC pain medications.    Baseline  will not be accomplished as pt normally takes prescription meds for her back; goal is to take 3 instead of 5    Time  2    Period  Weeks    Status  Revised        PT Long Term Goals - 11/04/17 1411      PT LONG TERM GOAL #1   Title  pt independent with advanced HEP as necessary    Time  4    Period  Weeks    Status  Achieved      PT LONG TERM GOAL #2   Title  Increase Rt knee AROM extension to < 5 degrees and flexion to > 105 to improve gait    Baseline  4/24: 3-106    Time  4    Period  Weeks    Status  Achieved      PT LONG TERM GOAL #3   Title  Patient/s FOTO limitation score will improve by 10 points to exhibit improved functional abilities.    Time  4    Period  Weeks    Status  Unable to assess      PT LONG TERM GOAL #4   Title  Patient will have pain no greater than 2/10 to encourage greater movement and activities.    Time  4    Period  Weeks    Status  Not Met            Plan - 11/06/17 1358    Clinical Impression Statement  Began session with warm up stretches prior to bike and manual towards end of session.  Pt prefers to warm up prior to avoid pain.  completed myofascial to scar with more sensitivities superiior knee but more tightness at distal end of scar.  Able to achieve ROM 1-110 following manual and without pain voiced.      Rehab Potential  Good    PT Frequency  2x / week    PT Duration  8 weeks    PT Treatment/Interventions  Biofeedback;Cryotherapy;Electrical Stimulation;Ultrasound;Moist Heat;Iontophoresis 49m/ml Dexamethasone;DME Instruction;Gait training;Stair training;Functional mobility training;Therapeutic activities;Therapeutic exercise;Patient/family education;Neuromuscular  re-education;Balance training;Scar mobilization;Passive range of motion;Dry needling;Energy conservation;Taping    PT Next Visit Plan  Continue with knee flexion and strengthening of her Rt gluteal medius and maximus mm .  If symptoms improved, add balance activities and functional squats next session.  Hold on SLS activities for LBP control.  Wedge with 2 pillows for tolerance with supine position    PT Home Exercise Plan  initial - HHPT HEP but with increased hold timesas patient not holding exercises; 10/21/17 - heel/toe raises, hamstring/calf stretches, scar massage       Patient will benefit from skilled therapeutic intervention in order to improve the following deficits and impairments:  Abnormal gait, Pain, Improper body mechanics, Decreased mobility, Decreased activity tolerance, Decreased endurance, Decreased balance, Decreased range of motion, Decreased strength, Difficulty walking  Visit Diagnosis: Difficulty in walking, not elsewhere classified  Unsteadiness on feet  Muscle weakness (generalized)  Acute pain of right knee     Problem List Patient Active Problem List   Diagnosis Date Noted  . Insomnia 12/13/2016  . Preventative health care 09/08/2014  . Ovarian  cyst 08/13/2014  . Preoperative cardiovascular examination 08/13/2014  . Abnormal EKG 08/13/2014  . PVC's (premature ventricular contractions)   . Fracture of left distal radius 12/30/2013  . Spinal stenosis of lumbar region 06/24/2013  . Atherosclerosis of aorta ([place]) 05/06/2013  . Osteopenia 03/06/2013  . Edema 11/05/2012  . Myalgia 04/11/2011  . Night sweats 12/27/2010  . Contact lens/glasses fitting 12/27/2010  . Menopause 12/27/2010  . Family history of breast cancer 12/27/2010  . ADJUSTMENT DISORDER WITH ANXIOUS MOOD 05/26/2010  . PULMONARY NODULE, LEFT UPPER LOBE 12/15/2009  . CONSTIPATION, DRUG INDUCED 08/19/2009  . KNEE PAIN, LEFT 09/13/2008  . THYROID NODULE, LEFT 05/26/2008  . GERD 05/26/2008   . INSOMNIA 05/06/2008  . Anemia 10/24/2007  . Hypothyroidism 08/12/2007  . Hyperlipidemia 08/12/2007  . TOBACCO ABUSE 08/12/2007  . Essential hypertension 08/12/2007  . OA (osteoarthritis) of knee 08/12/2007  . Hx of colonic polyps 08/28/2004   Teena Irani, PTA/CLT [phone]  Teena Irani 11/06/2017, 2:02 PM  [facility] [address] [place], [place], [address] Phone: [phone]   Fax:  [phone]  Name: Joan [name] MRN: [id number] Date of Birth: 07-14-1945

## 2017-11-08 ENCOUNTER — Ambulatory Visit (HOSPITAL_COMMUNITY): Payer: Medicare Other

## 2017-11-08 ENCOUNTER — Encounter (HOSPITAL_COMMUNITY): Payer: Self-pay

## 2017-11-08 ENCOUNTER — Encounter

## 2017-11-08 DIAGNOSIS — M6281 Muscle weakness (generalized): Secondary | ICD-10-CM | POA: Diagnosis not present

## 2017-11-08 DIAGNOSIS — R2681 Unsteadiness on feet: Secondary | ICD-10-CM | POA: Diagnosis not present

## 2017-11-08 DIAGNOSIS — R262 Difficulty in walking, not elsewhere classified: Secondary | ICD-10-CM

## 2017-11-08 DIAGNOSIS — M25561 Pain in right knee: Secondary | ICD-10-CM

## 2017-11-08 NOTE — Therapy (Signed)
[facility] [address] [place], [place], [address] Phone: [phone]   Fax:  [phone]  Physical Therapy Treatment  Patient Details  Name: Joan [name] MRN: [id number] Date of Birth: 11/27/1945 Referring Provider: Frank Aluisio, MD   Encounter Date: 11/08/2017  PT End of Session - 11/08/17 0958    Visit Number  10    Number of Visits  16    Date for PT Re-Evaluation  12/04/17    Authorization Type  [employer] Medicare A[employer] Plan 1 HMO complete; no deduct; copay $40/visit, coins OOP 4400 - $804.24    Authorization Time Period  medical necessity, no auth, no ref; cert 4/10--> 12/02/2017    Authorization - Visit Number  10    Authorization - Number of Visits  16    PT Start Time  0952 5' on bike, no charge    PT Stop Time  1036    PT Time Calculation (min)  44 min    Activity Tolerance  Patient limited by pain;Patient tolerated treatment well    Behavior During Therapy  WFL for tasks assessed/performed       Past Medical History:  Diagnosis Date  . Arthritis    "shoulders; wrist; probably spine" (06/24/2013)  . Chronic low back pain    followed by Dr Phillips pain mgt  . Colon polyp   . Constipation   . Esophageal stricture   . Finger pain, left    2 fingers on left hand since wrist surgery  . GERD (gastroesophageal reflux disease)   . Heart murmur    "slight; not on RX" (06/24/2013)  . History of cardiac arrhythmia    cardiologist- Traci Turner  . Hx of colonic polyps 08/28/2004  . Hyperlipemia   . Hypertension   . Hypothyroidism   . Osteoarthritis of left knee    advanced  . PONV (postoperative nausea and vomiting)    Pt reports symptoms are the result of gallbladder and cholecystectomy, not anesthesia  . PVC's (premature ventricular contractions)   . Sleep apnea 1990's   "tested; tried mask; couldn't stand it; told me as long as I slept on my side I'd be ok" (06/24/2013)  . Thyroid nodule   . Tobacco abuse     Past  Surgical History:  Procedure Laterality Date  . ABDOMINAL HYSTERECTOMY  1988   "partial" (06/24/2013)  . ABDOMINAL HYSTERECTOMY     partial in 1988  . ANKLE SURGERY Left 1995   "tendon repair" (06/24/2013)  . BACK SURGERY     "think today was my 8th back OR" (06/24/2013)  . CERVICAL SPINE SURGERY  2012  . CESAREAN SECTION  1972  . CHOLECYSTECTOMY N/A 04/16/2013   Procedure: LAPAROSCOPIC CHOLECYSTECTOMY ;  Surgeon: Matthew K. Tsuei, MD;  Location: [facility];  Service: General;  Laterality: N/A;  . ELBOW SURGERY  1990's  . FOOT SURGERY Right 2012   SPUR REMOVED  . INFUSION PUMP IMPLANTATION  1990's   "implantablet morphine pump; took it out w/in 11 months  . KNEE ARTHROSCOPY Left 1991; ~ 1993  . LAPAROSCOPIC LYSIS OF ADHESIONS N/A 04/16/2013   Procedure: LAPAROSCOPIC LYSIS OF ADHESIONS;  Surgeon: Matthew K. Tsuei, MD;  Location: [facility];  Service: General;  Laterality: N/A;  . LUMBAR FUSION     and rods  . LUMBAR LAMINECTOMY/DECOMPRESSION MICRODISCECTOMY N/A 11/28/2012   Procedure: DECOMPRESSIVE LUMBAR LAMINECTOMY LEVEL 1;  Surgeon: Gary P Cram, MD;  Location: [facility];  Service: Neurosurgery;  Laterality:   N/A;  DECOMPRESSIVE LUMBAR LAMINECTOMY LEVEL 1  . ORIF DISTAL RADIUS FRACTURE Left 12/30/2013   dr ortmann  . ORIF WRIST FRACTURE Left 12/30/2013   Procedure: OPEN REDUCTION INTERNAL FIXATION (ORIF) LEFT WRIST FRACTURE AND REPAIR AS INDICATED;  Surgeon: Fred W Ortmann, MD;  Location: [facility];  Service: Orthopedics;  Laterality: Left;  . POSTERIOR FUSION LUMBAR SPINE  06/24/2013  . ROBOTIC ASSISTED SALPINGO OOPHERECTOMY Bilateral 08/20/2014   Procedure: ROBOTIC ASSISTED SALPINGO OOPHORECTOMY;  Surgeon: Michelle A Horvath, MD;  Location: [facility];  Service: Gynecology;  Laterality: Bilateral;  . SHOULDER ARTHROSCOPY Left 09/2011  . THYROIDECTOMY  2012  . TOTAL KNEE ARTHROPLASTY Right 09/09/2017   Procedure: RIGHT TOTAL KNEE ARTHROPLASTY;  Surgeon: Aluisio, Frank, MD;  Location: [facility];  Service:  Orthopedics;  Laterality: Right;    There were no vitals filed for this visit.  Subjective Assessment - 11/08/17 0957    Subjective  Pt stated she could tell the weather changing last night, increased achey today.      Patient Stated Goals  get back to where I was before, get out of pain, get more rt knee bend, and be able to perform my regular activities    Currently in Pain?  Yes    Pain Score  5     Pain Location  Knee    Pain Orientation  Right    Pain Descriptors / Indicators  Aching    Pain Type  Acute pain    Pain Onset  More than a month ago    Pain Frequency  Intermittent    Aggravating Factors   weather    Pain Relieving Factors  ice         [facility] PT Assessment - 11/08/17 0001      Assessment   Medical Diagnosis  R TKA     Referring Provider  Frank Aluisio, MD    Onset Date/Surgical Date  09/09/17    Hand Dominance  Right    Next MD Visit  11/19/17      Precautions   Precautions  None                   [facility] Adult PT Treatment/Exercise - 11/08/17 0001      Knee/Hip Exercises: Stretches   Knee: Self-Stretch to increase Flexion  Right;5 reps;10 seconds;Limitations    Knee: Self-Stretch Limitations  knee drive on 12in step    Gastroc Stretch  Right;3 reps;30 seconds    Gastroc Stretch Limitations  slantboard      Knee/Hip Exercises: Aerobic   Recumbent Bike  seat 10--> 9full revolution x5 min; no charge      Knee/Hip Exercises: Standing   Heel Raises  15 reps    Heel Raises Limitations  toe raises    Knee Flexion  15 reps    Functional Squat  15 reps    Functional Squat Limitations  chair behind and cueing for mechanics    Wall Squat  10 reps;3 seconds for gluteal strengthenig and heel flexion    Other Standing Knee Exercises  sidestep 4RT with RTB inside // bars      Knee/Hip Exercises: Supine   Knee Extension Limitations  1    Knee Flexion Limitations  112      Manual Therapy   Manual Therapy  Myofascial release;Joint mobilization     Manual therapy comments  manual completed separate from all other exercise    Joint Mobilization  Patella mobs all direction      Soft tissue mobilization  scar up/down, ML, CW, CCW; patellar tendon, pes anserine, medial jt line               PT Short Term Goals - 11/04/17 1410      PT SHORT TERM GOAL #1   Title  pt independent in intial HEP    Time  2    Period  Weeks    Status  Achieved      PT SHORT TERM GOAL #2   Title  Increase Rt knee AROM extension to < 10 degrees and flexion to > 95 to improve gait    Baseline  13 to 86  4/24: 3-106    Time  2    Period  Weeks    Status  Achieved      PT SHORT TERM GOAL #3   Title  Patient will exhibit in in MMT by 1/2 grade for bilateral lower extremities to improve endurance and ability to walk > 2 blocks    Baseline  4/24:  walking 1, 1/4 blocks around her apartment complex    Time  2    Period  Weeks    Status  Partially Met      PT SHORT TERM GOAL #4   Title  Patient will have a reduction in pain so she is able to return to using OTC pain medications.    Baseline  will not be accomplished as pt normally takes prescription meds for her back; goal is to take 3 instead of 5    Time  2    Period  Weeks    Status  Revised        PT Long Term Goals - 11/04/17 1411      PT LONG TERM GOAL #1   Title  pt independent with advanced HEP as necessary    Time  4    Period  Weeks    Status  Achieved      PT LONG TERM GOAL #2   Title  Increase Rt knee AROM extension to < 5 degrees and flexion to > 105 to improve gait    Baseline  4/24: 3-106    Time  4    Period  Weeks    Status  Achieved      PT LONG TERM GOAL #3   Title  Patient/s FOTO limitation score will improve by 10 points to exhibit improved functional abilities.    Time  4    Period  Weeks    Status  Unable to assess      PT LONG TERM GOAL #4   Title  Patient will have pain no greater than 2/10 to encourage greater movement and activities.    Time  4    Period   Weeks    Status  Not Met            Plan - 11/08/17 1038    Clinical Impression Statement  Session focus with knee mobility and gluteal strengthening.  Continued with myofascial release technqiues to address scar tissue to assist with mobility.  Added resistance with sidestepping and began wall squats for glut med and max strengthening as well as improve flexion.  Able to acheive AROM 1-112 degrees at EOS.      Rehab Potential  Good    PT Frequency  2x / week    PT Duration  8 weeks    PT Treatment/Interventions  Biofeedback;Cryotherapy;Electrical Stimulation;Ultrasound;Moist Heat;Iontophoresis 80m/ml Dexamethasone;DME Instruction;Gait training;Stair training;Functional mobility  training;Therapeutic activities;Therapeutic exercise;Patient/family education;Neuromuscular re-education;Balance training;Scar mobilization;Passive range of motion;Dry needling;Energy conservation;Taping    PT Next Visit Plan  Continue with knee flexion and strengthening of her Rt gluteal medius and maximus mm .  If symptoms improved, add balance activities and functional squats next session.  Hold on SLS activities for LBP control.  Wedge with 2 pillows for tolerance with supine position    PT Home Exercise Plan  initial - HHPT HEP but with increased hold timesas patient not holding exercises; 10/21/17 - heel/toe raises, hamstring/calf stretches, scar massage       Patient will benefit from skilled therapeutic intervention in order to improve the following deficits and impairments:  Abnormal gait, Pain, Improper body mechanics, Decreased mobility, Decreased activity tolerance, Decreased endurance, Decreased balance, Decreased range of motion, Decreased strength, Difficulty walking  Visit Diagnosis: Difficulty in walking, not elsewhere classified  Unsteadiness on feet  Muscle weakness (generalized)  Acute pain of right knee     Problem List Patient Active Problem List   Diagnosis Date Noted  . Insomnia  12/13/2016  . Preventative health care 09/08/2014  . Ovarian cyst 08/13/2014  . Preoperative cardiovascular examination 08/13/2014  . Abnormal EKG 08/13/2014  . PVC's (premature ventricular contractions)   . Fracture of left distal radius 12/30/2013  . Spinal stenosis of lumbar region 06/24/2013  . Atherosclerosis of aorta ([facility]) 05/06/2013  . Osteopenia 03/06/2013  . Edema 11/05/2012  . Myalgia 04/11/2011  . Night sweats 12/27/2010  . Contact lens/glasses fitting 12/27/2010  . Menopause 12/27/2010  . Family history of breast cancer 12/27/2010  . ADJUSTMENT DISORDER WITH ANXIOUS MOOD 05/26/2010  . PULMONARY NODULE, LEFT UPPER LOBE 12/15/2009  . CONSTIPATION, DRUG INDUCED 08/19/2009  . KNEE PAIN, LEFT 09/13/2008  . THYROID NODULE, LEFT 05/26/2008  . GERD 05/26/2008  . INSOMNIA 05/06/2008  . Anemia 10/24/2007  . Hypothyroidism 08/12/2007  . Hyperlipidemia 08/12/2007  . TOBACCO ABUSE 08/12/2007  . Essential hypertension 08/12/2007  . OA (osteoarthritis) of knee 08/12/2007  . Hx of colonic polyps 08/28/2004   Casey Cockerham, LPTA; CBIS [phone]  Cockerham, Casey Jo 11/08/2017, 10:43 AM  [facility] [address] , [place], [address] Phone: [phone]   Fax:  [phone]  Name: Joan [name] MRN: [id number] Date of Birth: 03/27/1946   

## 2017-11-11 ENCOUNTER — Ambulatory Visit (HOSPITAL_COMMUNITY): Payer: Medicare Other | Admitting: Physical Therapy

## 2017-11-11 DIAGNOSIS — M6281 Muscle weakness (generalized): Secondary | ICD-10-CM

## 2017-11-11 DIAGNOSIS — R2681 Unsteadiness on feet: Secondary | ICD-10-CM | POA: Diagnosis not present

## 2017-11-11 DIAGNOSIS — R262 Difficulty in walking, not elsewhere classified: Secondary | ICD-10-CM

## 2017-11-11 DIAGNOSIS — M25561 Pain in right knee: Secondary | ICD-10-CM

## 2017-11-11 NOTE — Therapy (Signed)
[facility] [place], [place], [address] Phone: [phone]   Fax:  [phone]  Physical Therapy Treatment  Patient Details  Name: Joan [name] MRN: [id number] Date of Birth: [birth year] Referring Provider: Gaynelle Arabian, MD   Encounter Date: 11/11/2017  PT End of Session - 11/11/17 1648    Visit Number  11    Number of Visits  16    Date for PT Re-Evaluation  12/04/17    Authorization Type  [employer] Medicare AARP Plan 1 HMO complete; no deduct; copay $40/visit, coins OOP 4400 - $804.24    Authorization Time Period  medical necessity, no auth, no ref; cert 6/38--> 7/56/4332    Authorization - Visit Number  11    Authorization - Number of Visits  16    PT Start Time  1600    PT Stop Time  1642    PT Time Calculation (min)  42 min    Activity Tolerance  Patient limited by pain;Patient tolerated treatment well    Behavior During Therapy  [facility] for tasks assessed/performed       Past Medical History:  Diagnosis Date  . Arthritis    "shoulders; wrist; probably spine" (06/24/2013)  . Chronic low back pain    followed by Dr Hardin Negus pain mgt  . Colon polyp   . Constipation   . Esophageal stricture   . Finger pain, left    2 fingers on left hand since wrist surgery  . GERD (gastroesophageal reflux disease)   . Heart murmur    "slight; not on RX" (06/24/2013)  . History of cardiac arrhythmia    cardiologist- Traci Turner  . Hx of colonic polyps 08/28/2004  . Hyperlipemia   . Hypertension   . Hypothyroidism   . Osteoarthritis of left knee    advanced  . PONV (postoperative nausea and vomiting)    Pt reports symptoms are the result of gallbladder and cholecystectomy, not anesthesia  . PVC's (premature ventricular contractions)   . Sleep apnea 1990's   "tested; tried mask; couldn't stand it; told me as long as I slept on my side I'd be ok" (06/24/2013)  . Thyroid nodule   . Tobacco abuse     Past Surgical History:   Procedure Laterality Date  . ABDOMINAL HYSTERECTOMY  1988   "partial" (06/24/2013)  . ABDOMINAL HYSTERECTOMY     partial in 1988  . ANKLE SURGERY Left 1995   "tendon repair" (06/24/2013)  . BACK SURGERY     "think today was my 8th back OR" (06/24/2013)  . CERVICAL SPINE SURGERY  2012  . [place]  . CHOLECYSTECTOMY N/A 04/16/2013   Procedure: LAPAROSCOPIC CHOLECYSTECTOMY ;  Surgeon: Imogene Burn. Georgette Dover, MD;  Location: [facility];  Service: General;  Laterality: N/A;  . ELBOW SURGERY  1990's  . FOOT SURGERY Right 2012   SPUR REMOVED  . INFUSION PUMP IMPLANTATION  1990's   "implantablet morphine pump; took it out w/in 11 months  . KNEE ARTHROSCOPY Left 1991; ~ 1993  . LAPAROSCOPIC LYSIS OF ADHESIONS N/A 04/16/2013   Procedure: LAPAROSCOPIC LYSIS OF ADHESIONS;  Surgeon: Imogene Burn. Georgette Dover, MD;  Location: [facility];  Service: General;  Laterality: N/A;  . LUMBAR FUSION     and rods  . LUMBAR LAMINECTOMY/DECOMPRESSION MICRODISCECTOMY N/A 11/28/2012   Procedure: DECOMPRESSIVE LUMBAR LAMINECTOMY LEVEL 1;  Surgeon: Elaina Hoops, MD;  Location: [facility];  Service: Neurosurgery;  Laterality: N/A;  DECOMPRESSIVE LUMBAR LAMINECTOMY  LEVEL 1  . ORIF DISTAL RADIUS FRACTURE Left 12/30/2013   dr Caralyn Guile  . ORIF WRIST FRACTURE Left 12/30/2013   Procedure: OPEN REDUCTION INTERNAL FIXATION (ORIF) LEFT WRIST FRACTURE AND REPAIR AS INDICATED;  Surgeon: Linna Hoff, MD;  Location: [facility];  Service: Orthopedics;  Laterality: Left;  . POSTERIOR FUSION LUMBAR SPINE  06/24/2013  . ROBOTIC ASSISTED SALPINGO OOPHERECTOMY Bilateral 08/20/2014   Procedure: ROBOTIC ASSISTED SALPINGO OOPHORECTOMY;  Surgeon: Daria Pastures, MD;  Location: [facility];  Service: Gynecology;  Laterality: Bilateral;  . SHOULDER ARTHROSCOPY Left 09/2011  . THYROIDECTOMY  2012  . TOTAL KNEE ARTHROPLASTY Right 09/09/2017   Procedure: RIGHT TOTAL KNEE ARTHROPLASTY;  Surgeon: Gaynelle Arabian, MD;  [facility]: [facility];  Service: Orthopedics;   Laterality: Right;    There were no vitals filed for this visit.  Subjective Assessment - 11/11/17 1610    Subjective  Pt states she's been in the bed all weekend.  STates the squats flaired her back up last session and reports she does not want to do these anymore.  sTAtes she's been taking pain meds every 4 hours, having Lt sciatic pain.  Rt knee currently 4/10.    Currently in Pain?  Yes    Pain Score  4     Pain Location  Knee    Pain Orientation  Right    Pain Descriptors / Indicators  Aching    Pain Type  Acute pain;Surgical pain                       [facility] Adult PT Treatment/Exercise - 11/11/17 0001      Knee/Hip Exercises: Stretches   Knee: Self-Stretch to increase Flexion  Right;5 reps;10 seconds;Limitations    Knee: Self-Stretch Limitations  knee drive on 88IL step    Gastroc Stretch  Right;3 reps;30 seconds    Gastroc Stretch Limitations  slantboard      Knee/Hip Exercises: Aerobic   Recumbent Bike  seat 10--> 17fll revolution x5 min; no charge      Knee/Hip Exercises: Standing   Heel Raises  15 reps    Heel Raises Limitations  toe raises 15 reps    Knee Flexion  15 reps    Functional Squat Limitations  do not do these    Wall Squat  Limitations    Wall Squat Limitations  do not do these    Other Standing Knee Exercises  sidestep 4RT with RTB inside // bars      Knee/Hip Exercises: Supine   Knee Extension  AROM    Knee Extension Limitations  1    Knee Flexion  AROM    Knee Flexion Limitations  115      Manual Therapy   Manual Therapy  Myofascial release;Joint mobilization    Manual therapy comments  manual completed separate from all other exercise    Joint Mobilization  Patella mobs all direction    Soft tissue mobilization  scar up/down, ML, CW, CCW; patellar tendon, pes anserine, medial jt line               PT Short Term Goals - 11/04/17 1410      PT SHORT TERM GOAL #1   Title  pt independent in intial HEP    Time  2     Period  Weeks    Status  Achieved      PT SHORT TERM GOAL #2   Title  Increase Rt knee AROM extension to <  10 degrees and flexion to > 95 to improve gait    Baseline  13 to 86  4/24: 3-106    Time  2    Period  Weeks    Status  Achieved      PT SHORT TERM GOAL #3   Title  Patient will exhibit in in MMT by 1/2 grade for bilateral lower extremities to improve endurance and ability to walk > 2 blocks    Baseline  4/24:  walking 1, 1/4 blocks around her apartment complex    Time  2    Period  Weeks    Status  Partially Met      PT SHORT TERM GOAL #4   Title  Patient will have a reduction in pain so she is able to return to using OTC pain medications.    Baseline  will not be accomplished as pt normally takes prescription meds for her back; goal is to take 3 instead of 5    Time  2    Period  Weeks    Status  Revised        PT Long Term Goals - 11/04/17 1411      PT LONG TERM GOAL #1   Title  pt independent with advanced HEP as necessary    Time  4    Period  Weeks    Status  Achieved      PT LONG TERM GOAL #2   Title  Increase Rt knee AROM extension to < 5 degrees and flexion to > 105 to improve gait    Baseline  4/24: 3-106    Time  4    Period  Weeks    Status  Achieved      PT LONG TERM GOAL #3   Title  Patient/s FOTO limitation score will improve by 10 points to exhibit improved functional abilities.    Time  4    Period  Weeks    Status  Unable to assess      PT LONG TERM GOAL #4   Title  Patient will have pain no greater than 2/10 to encourage greater movement and activities.    Time  4    Period  Weeks    Status  Not Met            Plan - 11/11/17 1653    Clinical Impression Statement  continued focus on Rt knee with caution not to increase Lt sciatic pain.  Pt requested to discontinue squats as she believed this flaired her up after last session.  Pt also completed heelraise on Rt LE only as reported pain with completing bilaterally.   AROM mas of 110  prior to manual and then able to achieve 115 degrees flexion.  Pt with most restrictions distal scar and medially.     Rehab Potential  Good    PT Frequency  2x / week    PT Duration  8 weeks    PT Treatment/Interventions  Biofeedback;Cryotherapy;Electrical Stimulation;Ultrasound;Moist Heat;Iontophoresis 71m/ml Dexamethasone;DME Instruction;Gait training;Stair training;Functional mobility training;Therapeutic activities;Therapeutic exercise;Patient/family education;Neuromuscular re-education;Balance training;Scar mobilization;Passive range of motion;Dry needling;Energy conservation;Taping    PT Next Visit Plan  Continue with knee flexion and strengthening of her Rt gluteal medius and maximus mm .  Pt will require Wedge with 2 pillows for tolerance with supine position.  Next session begin vector stance on Rt LE only to target glute.      PT Home Exercise Plan  initial - HHPT HEP but with  increased hold timesas patient not holding exercises; 10/21/17 - heel/toe raises, hamstring/calf stretches, scar massage       Patient will benefit from skilled therapeutic intervention in order to improve the following deficits and impairments:  Abnormal gait, Pain, Improper body mechanics, Decreased mobility, Decreased activity tolerance, Decreased endurance, Decreased balance, Decreased range of motion, Decreased strength, Difficulty walking  Visit Diagnosis: Difficulty in walking, not elsewhere classified  Unsteadiness on feet  Muscle weakness (generalized)  Acute pain of right knee     Problem List Patient Active Problem List   Diagnosis Date Noted  . Insomnia 12/13/2016  . Preventative health care 09/08/2014  . Ovarian cyst 08/13/2014  . Preoperative cardiovascular examination 08/13/2014  . Abnormal EKG 08/13/2014  . PVC's (premature ventricular contractions)   . Fracture of left distal radius 12/30/2013  . Spinal stenosis of lumbar region 06/24/2013  . Atherosclerosis of aorta ([place])  05/06/2013  . Osteopenia 03/06/2013  . Edema 11/05/2012  . Myalgia 04/11/2011  . Night sweats 12/27/2010  . Contact lens/glasses fitting 12/27/2010  . Menopause 12/27/2010  . Family history of breast cancer 12/27/2010  . ADJUSTMENT DISORDER WITH ANXIOUS MOOD 05/26/2010  . PULMONARY NODULE, LEFT UPPER LOBE 12/15/2009  . CONSTIPATION, DRUG INDUCED 08/19/2009  . KNEE PAIN, LEFT 09/13/2008  . THYROID NODULE, LEFT 05/26/2008  . GERD 05/26/2008  . INSOMNIA 05/06/2008  . Anemia 10/24/2007  . Hypothyroidism 08/12/2007  . Hyperlipidemia 08/12/2007  . TOBACCO ABUSE 08/12/2007  . Essential hypertension 08/12/2007  . OA (osteoarthritis) of knee 08/12/2007  . Hx of colonic polyps 08/28/2004   Teena Irani, PTA/CLT [phone]  Teena Irani 11/11/2017, 4:58 PM  [facility] [address] [place], [place], [address] Phone: [phone]   Fax:  [phone]  Name: Joan [name] MRN: [id number] Date of Birth: 1945-10-20

## 2017-11-13 ENCOUNTER — Ambulatory Visit (HOSPITAL_COMMUNITY): Payer: Medicare Other | Admitting: Physical Therapy

## 2017-11-13 DIAGNOSIS — R262 Difficulty in walking, not elsewhere classified: Secondary | ICD-10-CM

## 2017-11-13 DIAGNOSIS — M25561 Pain in right knee: Secondary | ICD-10-CM

## 2017-11-13 DIAGNOSIS — R2681 Unsteadiness on feet: Secondary | ICD-10-CM | POA: Diagnosis not present

## 2017-11-13 DIAGNOSIS — M6281 Muscle weakness (generalized): Secondary | ICD-10-CM | POA: Diagnosis not present

## 2017-11-13 NOTE — Therapy (Signed)
[place], [place], [address] Phone: [phone]   Fax:  [phone]  Physical Therapy Treatment  Patient Details  Name: Joan [name] MRN: [id number] Date of Birth: [birth year] Referring Provider: Gaynelle Arabian, MD   Encounter Date: 11/13/2017  PT End of Session - 11/13/17 1346    Visit Number  12    Number of Visits  16    Date for PT Re-Evaluation  12/04/17    Authorization Type  [employer] Medicare A[employer] Plan 1 HMO complete; no deduct; copay $40/visit, coins OOP 4400 - $804.24    Authorization Time Period  medical necessity, no auth, no ref; cert 0/92--> 10/05/760    Authorization - Visit Number  12    Authorization - Number of Visits  16    PT Start Time  1300    PT Stop Time  2633    PT Time Calculation (min)  55 min    Activity Tolerance  Patient limited by pain;Patient tolerated treatment well    Behavior During Therapy  [facility] for tasks assessed/performed       Past Medical History:  Diagnosis Date  . Arthritis    "shoulders; wrist; probably spine" (06/24/2013)  . Chronic low back pain    followed by Dr Hardin Negus pain mgt  . Colon polyp   . Constipation   . Esophageal stricture   . Finger pain, left    2 fingers on left hand since wrist surgery  . GERD (gastroesophageal reflux disease)   . Heart murmur    "slight; not on RX" (06/24/2013)  . History of cardiac arrhythmia    cardiologist- Traci Turner  . Hx of colonic polyps 08/28/2004  . Hyperlipemia   . Hypertension   . Hypothyroidism   . Osteoarthritis of left knee    advanced  . PONV (postoperative nausea and vomiting)    Pt reports symptoms are the result of gallbladder and cholecystectomy, not anesthesia  . PVC's (premature ventricular contractions)   . Sleep apnea 1990's   "tested; tried mask; couldn't stand it; told me as long as I slept on my side I'd be ok" (06/24/2013)  . Thyroid nodule   . Tobacco abuse     Past Surgical History:   Procedure Laterality Date  . ABDOMINAL HYSTERECTOMY  1988   "partial" (06/24/2013)  . ABDOMINAL HYSTERECTOMY     partial in 1988  . ANKLE SURGERY Left 1995   "tendon repair" (06/24/2013)  . BACK SURGERY     "think today was my 8th back OR" (06/24/2013)  . CERVICAL SPINE SURGERY  2012  . [place]  . CHOLECYSTECTOMY N/A 04/16/2013   Procedure: LAPAROSCOPIC CHOLECYSTECTOMY ;  Surgeon: Imogene Burn. Georgette Dover, MD;  Location: [facility];  Service: General;  Laterality: N/A;  . ELBOW SURGERY  1990's  . FOOT SURGERY Right 2012   SPUR REMOVED  . INFUSION PUMP IMPLANTATION  1990's   "implantablet morphine pump; took it out w/in 11 months  . KNEE ARTHROSCOPY Left 1991; ~ 1993  . LAPAROSCOPIC LYSIS OF ADHESIONS N/A 04/16/2013   Procedure: LAPAROSCOPIC LYSIS OF ADHESIONS;  Surgeon: Imogene Burn. Georgette Dover, MD;  Location: [facility];  Service: General;  Laterality: N/A;  . LUMBAR FUSION     and rods  . LUMBAR LAMINECTOMY/DECOMPRESSION MICRODISCECTOMY N/A 11/28/2012   Procedure: DECOMPRESSIVE LUMBAR LAMINECTOMY LEVEL 1;  Surgeon: Elaina Hoops, MD;  Location: [place] [facility];  Service: Neurosurgery;  Laterality: N/A;  DECOMPRESSIVE LUMBAR LAMINECTOMY  LEVEL 1  . ORIF DISTAL RADIUS FRACTURE Left 12/30/2013   dr Caralyn Guile  . ORIF WRIST FRACTURE Left 12/30/2013   Procedure: OPEN REDUCTION INTERNAL FIXATION (ORIF) LEFT WRIST FRACTURE AND REPAIR AS INDICATED;  Surgeon: Linna Hoff, MD;  Location: [place];  Service: Orthopedics;  Laterality: Left;  . POSTERIOR FUSION LUMBAR SPINE  06/24/2013  . ROBOTIC ASSISTED SALPINGO OOPHERECTOMY Bilateral 08/20/2014   Procedure: ROBOTIC ASSISTED SALPINGO OOPHORECTOMY;  Surgeon: Daria Pastures, MD;  Location: [place] [facility];  Service: Gynecology;  Laterality: Bilateral;  . SHOULDER ARTHROSCOPY Left 09/2011  . THYROIDECTOMY  2012  . TOTAL KNEE ARTHROPLASTY Right 09/09/2017   Procedure: RIGHT TOTAL KNEE ARTHROPLASTY;  Surgeon: Gaynelle Arabian, MD;  [facility];  Service: Orthopedics;   Laterality: Right;    There were no vitals filed for this visit.  Subjective Assessment - 11/13/17 1351    Subjective  Pt states she has been sitting for about 20 minutes and she is currently stiff.  STates she can tell rain is coming in.  currently 5/10 pain in Rt knee.  Sciatic pain in Lt LB and into LE still prevelent and worse today as she forgot to don her brace on her Lt knee.                       [place] Adult PT Treatment/Exercise - 11/13/17 0001      Knee/Hip Exercises: Stretches   Knee: Self-Stretch to increase Flexion  Right;5 reps;10 seconds;Limitations    Knee: Self-Stretch Limitations  knee drive on 95MW step    Gastroc Stretch  Right;3 reps;30 seconds    Gastroc Stretch Limitations  slantboard      Knee/Hip Exercises: Aerobic   Recumbent Bike  seat 10--> 46fll revolution x5 min; no charge      Knee/Hip Exercises: Standing   Heel Raises  15 reps    Heel Raises Limitations  toe raises 15 reps    Knee Flexion  15 reps    Other Standing Knee Exercises  --      Knee/Hip Exercises: Supine   Knee Extension  AROM    Knee Extension Limitations  0    Knee Flexion  AROM    Knee Flexion Limitations  110      Modalities   Modalities  Cryotherapy no charge      Cryotherapy   Number Minutes Cryotherapy  10 Minutes    Cryotherapy Location  Knee    Type of Cryotherapy  Ice pack      Manual Therapy   Manual Therapy  Myofascial release;Joint mobilization    Manual therapy comments  manual completed separate from all other exercise    Joint Mobilization  Patella mobs all direction    Soft tissue mobilization  scar up/down, ML, CW, CCW; patellar tendon, pes anserine, medial jt line             PT Education - 11/13/17 1350    Education provided  Yes    Education Details  limit activity that aggrevates Lt sciatic pain.  Use Lt knee brace regularly as she reports this decreases her Lt sciatic pain.  Ice as needed.    Person(s) Educated  Patient     Methods  Explanation    Comprehension  Verbalized understanding       PT Short Term Goals - 11/04/17 1410      PT SHORT TERM GOAL #1   Title  pt independent in intial HEP  Time  2    Period  Weeks    Status  Achieved      PT SHORT TERM GOAL #2   Title  Increase Rt knee AROM extension to < 10 degrees and flexion to > 95 to improve gait    Baseline  13 to 86  4/24: 3-106    Time  2    Period  Weeks    Status  Achieved      PT SHORT TERM GOAL #3   Title  Patient will exhibit in in MMT by 1/2 grade for bilateral lower extremities to improve endurance and ability to walk > 2 blocks    Baseline  4/24:  walking 1, 1/4 blocks around her apartment complex    Time  2    Period  Weeks    Status  Partially Met      PT SHORT TERM GOAL #4   Title  Patient will have a reduction in pain so she is able to return to using OTC pain medications.    Baseline  will not be accomplished as pt normally takes prescription meds for her back; goal is to take 3 instead of 5    Time  2    Period  Weeks    Status  Revised        PT Long Term Goals - 11/04/17 1411      PT LONG TERM GOAL #1   Title  pt independent with advanced HEP as necessary    Time  4    Period  Weeks    Status  Achieved      PT LONG TERM GOAL #2   Title  Increase Rt knee AROM extension to < 5 degrees and flexion to > 105 to improve gait    Baseline  4/24: 3-106    Time  4    Period  Weeks    Status  Achieved      PT LONG TERM GOAL #3   Title  Patient/s FOTO limitation score will improve by 10 points to exhibit improved functional abilities.    Time  4    Period  Weeks    Status  Unable to assess      PT LONG TERM GOAL #4   Title  Patient will have pain no greater than 2/10 to encourage greater movement and activities.    Time  4    Period  Weeks    Status  Not Met            Plan - 11/13/17 1346    Clinical Impression Statement  PT with increased Lt knee and sciatic pain today while weight bearing, less  not weight bearing.  Able to complete most exericses, however unable to continue after attempt at side stepping due to increased pain.  Did not add additional therex today as well.  Shifting to mat activty and completed manual.  Pt able to achieve 0-110 degrees today, less flexion than previous session as patient states she feels she is more swollen today as well.  PT requested to add ice at EOS as she as errands to run following session.   suggested to pateint that she use the knee brace on her Lt as she states this helps her sciatic pain significantly and return to MD if pain worsens.  PT verbalized understanding. PT pain in Rt knee decreased to 0/10 at EOS following icepack.     Rehab Potential  Good    PT  Frequency  2x / week    PT Duration  8 weeks    PT Treatment/Interventions  Biofeedback;Cryotherapy;Electrical Stimulation;Ultrasound;Moist Heat;Iontophoresis 92m/ml Dexamethasone;DME Instruction;Gait training;Stair training;Functional mobility training;Therapeutic activities;Therapeutic exercise;Patient/family education;Neuromuscular re-education;Balance training;Scar mobilization;Passive range of motion;Dry needling;Energy conservation;Taping    PT Next Visit Plan  Continue with knee flexion and strengthening of her Rt gluteal medius and maximus mm .  Pt will require Wedge with 2 pillows for tolerance with supine position.  Next session begin vector stance on Rt LE only to target glute.  Progress as able due to Lt sciatic pain limiting activity and difficulty placing full weight onto Lt.     PT Home Exercise Plan  initial - HHPT HEP but with increased hold timesas patient not holding exercises; 10/21/17 - heel/toe raises, hamstring/calf stretches, scar massage       Patient will benefit from skilled therapeutic intervention in order to improve the following deficits and impairments:  Abnormal gait, Pain, Improper body mechanics, Decreased mobility, Decreased activity tolerance, Decreased endurance,  Decreased balance, Decreased range of motion, Decreased strength, Difficulty walking  Visit Diagnosis: Difficulty in walking, not elsewhere classified  Unsteadiness on feet  Muscle weakness (generalized)  Acute pain of right knee     Problem List Patient Active Problem List   Diagnosis Date Noted  . Insomnia 12/13/2016  . Preventative health care 09/08/2014  . Ovarian cyst 08/13/2014  . Preoperative cardiovascular examination 08/13/2014  . Abnormal EKG 08/13/2014  . PVC's (premature ventricular contractions)   . Fracture of left distal radius 12/30/2013  . Spinal stenosis of lumbar region 06/24/2013  . [place]ros[place] of [place] ([place] 05/06/2013  . Osteopenia 03/06/2013  . Edema 11/05/2012  . Myalgia 04/11/2011  . Night sweats 12/27/2010  . Contact lens/glasses fitting 12/27/2010  . Menopause 12/27/2010  . Family history of breast cancer 12/27/2010  . ADJUSTMENT DISORDER WITH ANXIOUS MOOD 05/26/2010  . PULMONARY NODULE, LEFT UPPER LOBE 12/15/2009  . CONSTIPATION, DRUG INDUCED 08/19/2009  . KNEE PAIN, LEFT 09/13/2008  . THYROID NODULE, LEFT 05/26/2008  . GERD 05/26/2008  . INSOMNIA 05/06/2008  . Anemia 10/24/2007  . Hypothyroidism 08/12/2007  . Hyperlipidemia 08/12/2007  . TOBACCO ABUSE 08/12/2007  . Essential hypertension 08/12/2007  . OA (osteoarthritis) of knee 08/12/2007  . Hx of colonic polyps 08/28/2004   ATeena Irani PTA/CLT [phone] FTeena Irani5/02/2018, 1:56 PM  [place] [address]Phone: [phone]  Fax:  [phone] Name: Joan HELGESONMRN: [id number] of Birth: 512-25-1947

## 2017-11-15 ENCOUNTER — Encounter (HOSPITAL_COMMUNITY): Payer: Self-pay | Admitting: Physical Therapy

## 2017-11-15 ENCOUNTER — Ambulatory Visit (HOSPITAL_COMMUNITY): Payer: Medicare Other | Admitting: Physical Therapy

## 2017-11-15 DIAGNOSIS — M25561 Pain in right knee: Secondary | ICD-10-CM | POA: Diagnosis not present

## 2017-11-15 DIAGNOSIS — R262 Difficulty in walking, not elsewhere classified: Secondary | ICD-10-CM | POA: Diagnosis not present

## 2017-11-15 DIAGNOSIS — M6281 Muscle weakness (generalized): Secondary | ICD-10-CM | POA: Diagnosis not present

## 2017-11-15 DIAGNOSIS — R2681 Unsteadiness on feet: Secondary | ICD-10-CM | POA: Diagnosis not present

## 2017-11-15 NOTE — Therapy (Signed)
[place], [place], [address] Phone: [phone]   Fax:  [phone]  Physical Therapy Treatment  Patient Details  Name: Joan [name] MRN: [id number] Date of Birth: 02-Aug-1945 Referring Provider: Gaynelle Arabian, MD   Encounter Date: 11/15/2017  PT End of Session - 11/15/17 1429    Visit Number  13    Number of Visits  16    Date for PT Re-Evaluation  12/04/17    Authorization Type  [employer] Medicare AARP Plan 1 HMO complete; no deduct; copay $40/visit, coins OOP 4400 - $804.24    Authorization Time Period  medical necessity, no auth, no ref; cert 4/25--> 9/56/3875    Authorization - Visit Number  13    Authorization - Number of Visits  16    PT Start Time  1304    PT Stop Time  1348    PT Time Calculation (min)  44 min    Activity Tolerance  Patient limited by pain;Patient tolerated treatment well    Behavior During Therapy  [facility] for tasks assessed/performed       Past Medical History:  Diagnosis Date  . Arthritis    "shoulders; wrist; probably spine" (06/24/2013)  . Chronic low back pain    followed by Dr Hardin Negus pain mgt  . Colon polyp   . Constipation   . Esophageal stricture   . Finger pain, left    2 fingers on left hand since wrist surgery  . GERD (gastroesophageal reflux disease)   . Heart murmur    "slight; not on RX" (06/24/2013)  . History of cardiac arrhythmia    cardiologist- Traci Turner  . Hx of colonic polyps 08/28/2004  . Hyperlipemia   . Hypertension   . Hypothyroidism   . Osteoarthritis of left knee    advanced  . PONV (postoperative nausea and vomiting)    Pt reports symptoms are the result of gallbladder and cholecystectomy, not anesthesia  . PVC's (premature ventricular contractions)   . Sleep apnea 1990's   "tested; tried mask; couldn't stand it; told me as long as I slept on my side I'd be ok" (06/24/2013)  . Thyroid nodule   . Tobacco abuse     Past Surgical History:   Procedure Laterality Date  . ABDOMINAL HYSTERECTOMY  1988   "partial" (06/24/2013)  . ABDOMINAL HYSTERECTOMY     partial in 1988  . ANKLE SURGERY Left 1995   "tendon repair" (06/24/2013)  . BACK SURGERY     "think today was my 8th back OR" (06/24/2013)  . CERVICAL SPINE SURGERY  2012  . [place]  . CHOLECYSTECTOMY N/A 04/16/2013   Procedure: LAPAROSCOPIC CHOLECYSTECTOMY ;  Surgeon: Imogene Burn. Georgette Dover, MD;  [facility];  Service: General;  Laterality: N/A;  . ELBOW SURGERY  1990's  . FOOT SURGERY Right 2012   SPUR REMOVED  . INFUSION PUMP IMPLANTATION  1990's   "implantablet morphine pump; took it out w/in 11 months  . KNEE ARTHROSCOPY Left 1991; ~ 1993  . LAPAROSCOPIC LYSIS OF ADHESIONS N/A 04/16/2013   Procedure: LAPAROSCOPIC LYSIS OF ADHESIONS;  Surgeon: Imogene Burn. Georgette Dover, MD;  Location: [facility];  Service: General;  Laterality: N/A;  . LUMBAR FUSION     and rods  . LUMBAR LAMINECTOMY/DECOMPRESSION MICRODISCECTOMY N/A 11/28/2012   Procedure: DECOMPRESSIVE LUMBAR LAMINECTOMY LEVEL 1;  Surgeon: Elaina Hoops, MD;  Location: [place] [facility];  Service: Neurosurgery;  Laterality: N/A;  DECOMPRESSIVE LUMBAR LAMINECTOMY  LEVEL 1  . ORIF DISTAL RADIUS FRACTURE Left 12/30/2013   dr Caralyn Guile  . ORIF WRIST FRACTURE Left 12/30/2013   Procedure: OPEN REDUCTION INTERNAL FIXATION (ORIF) LEFT WRIST FRACTURE AND REPAIR AS INDICATED;  Surgeon: Linna Hoff, MD;  Location: [place];  Service: Orthopedics;  Laterality: Left;  . POSTERIOR FUSION LUMBAR SPINE  06/24/2013  . ROBOTIC ASSISTED SALPINGO OOPHERECTOMY Bilateral 08/20/2014   Procedure: ROBOTIC ASSISTED SALPINGO OOPHORECTOMY;  Surgeon: Daria Pastures, MD;  Location: [facility];  Service: Gynecology;  Laterality: Bilateral;  . SHOULDER ARTHROSCOPY Left 09/2011  . THYROIDECTOMY  2012  . TOTAL KNEE ARTHROPLASTY Right 09/09/2017   Procedure: RIGHT TOTAL KNEE ARTHROPLASTY;  Surgeon: Gaynelle Arabian, MD;  [facility];  Service: Orthopedics;   Laterality: Right;    There were no vitals filed for this visit.  Subjective Assessment - 11/15/17 1308    Subjective  Patient stated that she is having pain in her back and in both knees this session.     Currently in Pain?  Yes    Pain Score  4     Pain Location  Knee Both knees    Pain Orientation  Right;Left    Pain Descriptors / Indicators  Aching    Pain Type  Acute pain    Pain Onset  More than a month ago    Pain Score  4    Pain Location  Back    Pain Orientation  Lower    Pain Descriptors / Indicators  Aching    Pain Type  Chronic pain    Pain Onset  More than a month ago                       [facility] Adult PT Treatment/Exercise - 11/15/17 0001      Knee/Hip Exercises: Stretches   Knee: Self-Stretch to increase Flexion  10 seconds;Other (comment);Right 10 repetitions    Knee: Self-Stretch Limitations  knee drive on 45XM step    Gastroc Stretch  Right;3 reps;30 seconds    Gastroc Stretch Limitations  slantboard      Knee/Hip Exercises: Aerobic   Recumbent Bike  seat 10--> 91fll revolution x5 min; no charge      Knee/Hip Exercises: Standing   Heel Raises  15 reps;Right    Heel Raises Limitations  toe raises 15 reps    Knee Flexion  15 reps    Rocker Board  2 minutes;Limitations    Rocker Board Limitations  lateral and DF/PF    Other Standing Knee Exercises  sidestep 4 roundtrips with RTB around ankles inside // bars      Knee/Hip Exercises: Supine   Knee Extension  AROM    Knee Extension Limitations  0    Knee Flexion  AROM    Knee Flexion Limitations  115      Manual Therapy   Manual Therapy  Myofascial release;Joint mobilization    Manual therapy comments  manual completed separate from all other exercise    Joint Mobilization  Patella mobs all directions. Most limited superiorly and inferior directions.    Myofascial Release  Scar mobilization up and down, medial and lateral on scar around patellar tendon. Patient reported a decrease in pain  following manual.              PT Education - 11/15/17 1311    Education provided  Yes    Education Details  Patient was educated on purpose and technique of exercises  throughout session.     Person(s) Educated  Patient    Methods  Explanation    Comprehension  Verbalized understanding       PT Short Term Goals - 11/04/17 1410      PT SHORT TERM GOAL #1   Title  pt independent in intial HEP    Time  2    Period  Weeks    Status  Achieved      PT SHORT TERM GOAL #2   Title  Increase Rt knee AROM extension to < 10 degrees and flexion to > 95 to improve gait    Baseline  13 to 86  4/24: 3-106    Time  2    Period  Weeks    Status  Achieved      PT SHORT TERM GOAL #3   Title  Patient will exhibit in in MMT by 1/2 grade for bilateral lower extremities to improve endurance and ability to walk > 2 blocks    Baseline  4/24:  walking 1, 1/4 blocks around her apartment complex    Time  2    Period  Weeks    Status  Partially Met      PT SHORT TERM GOAL #4   Title  Patient will have a reduction in pain so she is able to return to using OTC pain medications.    Baseline  will not be accomplished as pt normally takes prescription meds for her back; goal is to take 3 instead of 5    Time  2    Period  Weeks    Status  Revised        PT Long Term Goals - 11/04/17 1411      PT LONG TERM GOAL #1   Title  pt independent with advanced HEP as necessary    Time  4    Period  Weeks    Status  Achieved      PT LONG TERM GOAL #2   Title  Increase Rt knee AROM extension to < 5 degrees and flexion to > 105 to improve gait    Baseline  4/24: 3-106    Time  4    Period  Weeks    Status  Achieved      PT LONG TERM GOAL #3   Title  Patient/s FOTO limitation score will improve by 10 points to exhibit improved functional abilities.    Time  4    Period  Weeks    Status  Unable to assess      PT LONG TERM GOAL #4   Title  Patient will have pain no greater than 2/10 to  encourage greater movement and activities.    Time  4    Period  Weeks    Status  Not Met            Plan - 11/15/17 1430    Clinical Impression Statement  This session continued to work on progressing patient with her knee range of motion and lower extremity strengthening. Session began with patient on the bike to improve ROM which was unbilled. Patient reported that she was feeling better at this session than previous session. This session patient was able to perform sidestepping in the parallel bars and the rockerboard this session, which she had not felt well enough to perform at previous session. Patient would benefit from continued skilled physical therapy with a focus on improving patient's right knee range of motion.  Rehab Potential  Good    PT Frequency  2x / week    PT Duration  8 weeks    PT Treatment/Interventions  Biofeedback;Cryotherapy;Electrical Stimulation;Ultrasound;Moist Heat;Iontophoresis 66m/ml Dexamethasone;DME Instruction;Gait training;Stair training;Functional mobility training;Therapeutic activities;Therapeutic exercise;Patient/family education;Neuromuscular re-education;Balance training;Scar mobilization;Passive range of motion;Dry needling;Energy conservation;Taping    PT Next Visit Plan  Continue with knee flexion and strengthening of her Rt gluteal medius and maximus mm .  Pt will require Wedge with 2 pillows for tolerance with supine position.  Next session begin vector stance on Rt LE only to target glute.  Progress as able due to Lt sciatic pain limiting activity and difficulty placing full weight onto Lt.     PT Home Exercise Plan  initial - HHPT HEP but with increased hold timesas patient not holding exercises; 10/21/17 - heel/toe raises, hamstring/calf stretches, scar massage       Patient will benefit from skilled therapeutic intervention in order to improve the following deficits and impairments:  Abnormal gait, Pain, Improper body mechanics, Decreased  mobility, Decreased activity tolerance, Decreased endurance, Decreased balance, Decreased range of motion, Decreased strength, Difficulty walking  Visit Diagnosis: Difficulty in walking, not elsewhere classified  Unsteadiness on feet  Muscle weakness (generalized)  Acute pain of right knee     Problem List Patient Active Problem List   Diagnosis Date Noted  . Insomnia 12/13/2016  . Preventative health care 09/08/2014  . Ovarian cyst 08/13/2014  . Preoperative cardiovascular examination 08/13/2014  . Abnormal EKG 08/13/2014  . PVC's (premature ventricular contractions)   . Fracture of left distal radius 12/30/2013  . Spinal stenosis of lumbar region 06/24/2013  . [place]ros[place] of [place] ([place] 05/06/2013  . Osteopenia 03/06/2013  . Edema 11/05/2012  . Myalgia 04/11/2011  . Night sweats 12/27/2010  . Contact lens/glasses fitting 12/27/2010  . Menopause 12/27/2010  . Family history of breast cancer 12/27/2010  . ADJUSTMENT DISORDER WITH ANXIOUS MOOD 05/26/2010  . PULMONARY NODULE, LEFT UPPER LOBE 12/15/2009  . CONSTIPATION, DRUG INDUCED 08/19/2009  . KNEE PAIN, LEFT 09/13/2008  . THYROID NODULE, LEFT 05/26/2008  . GERD 05/26/2008  . INSOMNIA 05/06/2008  . Anemia 10/24/2007  . Hypothyroidism 08/12/2007  . Hyperlipidemia 08/12/2007  . TOBACCO ABUSE 08/12/2007  . Essential hypertension 08/12/2007  . OA (osteoarthritis) of knee 08/12/2007  . Hx of colonic polyps 08/28/2004   MClarene CritchleyPT, DPT 2:36 PM, 11/15/17 [address] [address]Phone: [phone]  Fax:  [phone] Name: Joan MAYOLMRN: [id number] of Birth: [birth year]

## 2017-11-18 ENCOUNTER — Ambulatory Visit (HOSPITAL_COMMUNITY): Payer: Medicare Other | Admitting: Physical Therapy

## 2017-11-18 DIAGNOSIS — R2681 Unsteadiness on feet: Secondary | ICD-10-CM

## 2017-11-18 DIAGNOSIS — R262 Difficulty in walking, not elsewhere classified: Secondary | ICD-10-CM | POA: Diagnosis not present

## 2017-11-18 DIAGNOSIS — M6281 Muscle weakness (generalized): Secondary | ICD-10-CM

## 2017-11-18 DIAGNOSIS — M25561 Pain in right knee: Secondary | ICD-10-CM

## 2017-11-18 NOTE — Therapy (Signed)
[place], [place], [address] Phone: [phone]   Fax:  [phone]  Physical Therapy Treatment  Patient Details  Name: Joan [name] MRN: [id number] Date of Birth: [birth year] Referring Provider: Gaynelle Arabian, MD   Encounter Date: 11/18/2017  PT End of Session - 11/18/17 1553    Visit Number  14    Number of Visits  16    Date for PT Re-Evaluation  12/04/17    Authorization Type  [employer] Medicare AARP Plan 1 HMO complete; no deduct; copay $40/visit, coins OOP 4400 - $804.24    Authorization Time Period  medical necessity, no auth, no ref; cert 4/74--> 2/59/5638    Authorization - Visit Number  14    Authorization - Number of Visits  16    PT Start Time  7564    PT Stop Time  1558    PT Time Calculation (min)  42 min    Activity Tolerance  Patient limited by pain;Patient tolerated treatment well    Behavior During Therapy  [facility] for tasks assessed/performed       Past Medical History:  Diagnosis Date  . Arthritis    "shoulders; wrist; probably spine" (06/24/2013)  . Chronic low back pain    followed by Dr Hardin Negus pain mgt  . Colon polyp   . Constipation   . Esophageal stricture   . Finger pain, left    2 fingers on left hand since wrist surgery  . GERD (gastroesophageal reflux disease)   . Heart murmur    "slight; not on RX" (06/24/2013)  . History of cardiac arrhythmia    cardiologist- Traci Turner  . Hx of colonic polyps 08/28/2004  . Hyperlipemia   . Hypertension   . Hypothyroidism   . Osteoarthritis of left knee    advanced  . PONV (postoperative nausea and vomiting)    Pt reports symptoms are the result of gallbladder and cholecystectomy, not anesthesia  . PVC's (premature ventricular contractions)   . Sleep apnea 1990's   "tested; tried mask; couldn't stand it; told me as long as I slept on my side I'd be ok" (06/24/2013)  . Thyroid nodule   . Tobacco abuse     Past Surgical History:   Procedure Laterality Date  . ABDOMINAL HYSTERECTOMY  1988   "partial" (06/24/2013)  . ABDOMINAL HYSTERECTOMY     partial in 1988  . ANKLE SURGERY Left 1995   "tendon repair" (06/24/2013)  . BACK SURGERY     "think today was my 8th back OR" (06/24/2013)  . CERVICAL SPINE SURGERY  2012  . [place]  . CHOLECYSTECTOMY N/A 04/16/2013   Procedure: LAPAROSCOPIC CHOLECYSTECTOMY ;  Surgeon: Imogene Burn. Georgette Dover, MD;  Location: [facility];  Service: General;  Laterality: N/A;  . ELBOW SURGERY  1990's  . FOOT SURGERY Right 2012   SPUR REMOVED  . INFUSION PUMP IMPLANTATION  1990's   "implantablet morphine pump; took it out w/in 11 months  . KNEE ARTHROSCOPY Left 1991; ~ 1993  . LAPAROSCOPIC LYSIS OF ADHESIONS N/A 04/16/2013   Procedure: LAPAROSCOPIC LYSIS OF ADHESIONS;  Surgeon: Imogene Burn. Georgette Dover, MD;  [facility];  Service: General;  Laterality: N/A;  . LUMBAR FUSION     and rods  . LUMBAR LAMINECTOMY/DECOMPRESSION MICRODISCECTOMY N/A 11/28/2012   Procedure: DECOMPRESSIVE LUMBAR LAMINECTOMY LEVEL 1;  Surgeon: Elaina Hoops, MD;  Location: [place] [facility];  Service: Neurosurgery;  Laterality: N/A;  DECOMPRESSIVE LUMBAR LAMINECTOMY  LEVEL 1  . ORIF DISTAL RADIUS FRACTURE Left 12/30/2013   dr Caralyn Guile  . ORIF WRIST FRACTURE Left 12/30/2013   Procedure: OPEN REDUCTION INTERNAL FIXATION (ORIF) LEFT WRIST FRACTURE AND REPAIR AS INDICATED;  Surgeon: Linna Hoff, MD;  Location: [facility];  Service: Orthopedics;  Laterality: Left;  . POSTERIOR FUSION LUMBAR SPINE  06/24/2013  . ROBOTIC ASSISTED SALPINGO OOPHERECTOMY Bilateral 08/20/2014   Procedure: ROBOTIC ASSISTED SALPINGO OOPHORECTOMY;  Surgeon: Daria Pastures, MD;  Location: [facility];  Service: Gynecology;  Laterality: Bilateral;  . SHOULDER ARTHROSCOPY Left 09/2011  . THYROIDECTOMY  2012  . TOTAL KNEE ARTHROPLASTY Right 09/09/2017   Procedure: RIGHT TOTAL KNEE ARTHROPLASTY;  Surgeon: Gaynelle Arabian, MD;  [facility];  Service: Orthopedics;   Laterality: Right;    There were no vitals filed for this visit.  Subjective Assessment - 11/18/17 1523    Subjective  Pt states she got her TENS unit working and it has really helped her pain.  STates really she's only having issues with her Lt knee.   Currently 2/10 in Rt knee and 5/10 in Lt knee.     Currently in Pain?  Yes    Pain Score  2     Pain Location  Knee    Pain Orientation  Right    Pain Descriptors / Indicators  Aching    Multiple Pain Sites  Yes    Pain Score  5    Pain Location  Knee    Pain Orientation  Left    Pain Descriptors / Indicators  Aching                       [facility] Adult PT Treatment/Exercise - 11/18/17 0001      Knee/Hip Exercises: Stretches   Knee: Self-Stretch to increase Flexion  10 seconds;Other (comment);Right    Knee: Self-Stretch Limitations  knee drive on [employer] step    Gastroc Stretch  Right;3 reps;30 seconds    Gastroc Stretch Limitations  slantboard      Knee/Hip Exercises: Aerobic   Recumbent Bike  seat 10--> 42fll revolution x5 min; no charge      Knee/Hip Exercises: Standing   Heel Raises  Both;20 reps    Heel Raises Limitations  toe raises 20 reps    Knee Flexion  15 reps    SLS with Vectors  5 X 3" holds RT LE only with 1 UE assist      Knee/Hip Exercises: Supine   Knee Extension  AROM    Knee Extension Limitations  0    Knee Flexion  AROM    Knee Flexion Limitations  115      Manual Therapy   Manual Therapy  Myofascial release;Joint mobilization    Manual therapy comments  manual completed separate from all other exercise    Joint Mobilization  Patella mobs all directions. Most limited superiorly and inferior directions.               PT Short Term Goals - 11/18/17 1554      PT SHORT TERM GOAL #1   Title  pt independent in intial HEP    Time  2    Period  Weeks    Status  Achieved      PT SHORT TERM GOAL #2   Title  Increase Rt knee AROM extension to < 10 degrees and flexion to > 95 to  improve gait    Baseline  13 to 86  4/24: 3-106    Time  2    Period  Weeks    Status  Achieved      PT SHORT TERM GOAL #3   Title  Patient will exhibit in in MMT by 1/2 grade for bilateral lower extremities to improve endurance and ability to walk > 2 blocks    Baseline  4/24:  walking 1, 1/4 blocks around her apartment complex  5/13 hasn't in past 2 weeks due to weather and increased pain in her Lt hip    Time  2    Period  Weeks    Status  On-going      PT SHORT TERM GOAL #4   Title  Patient will have a reduction in pain so she is able to return to using OTC pain medications.    Baseline  will not be accomplished as pt normally takes prescription meds for her back; goal is to take 3 instead of 5    Time  2    Period  Weeks    Status  Revised        PT Long Term Goals - 11/18/17 1555      PT LONG TERM GOAL #1   Title  pt independent with advanced HEP as necessary    Time  4    Period  Weeks    Status  Achieved      PT LONG TERM GOAL #2   Title  Increase Rt knee AROM extension to < 5 degrees and flexion to > 105 to improve gait    Baseline  4/24: 3-106    Time  4    Period  Weeks    Status  Achieved      PT LONG TERM GOAL #3   Title  Patient/s FOTO limitation score will improve by 10 points to exhibit improved functional abilities.    Time  4    Period  Weeks    Status  Unable to assess      PT LONG TERM GOAL #4   Title  Patient will have pain no greater than 2/10 to encourage greater movement and activities.    Baseline  5/13:  Rt knee is 2/10 today, however still having alot of pain in Lt LE due to sciatica    Time  4    Period  Weeks    Status  Partially Met            Plan - 11/18/17 1558    Clinical Impression Statement  Pt has completed 14/16 scheduled visits with initial focus being Rt knee ROM, decreasing pain and edema.  Pt has progressed well with this with current ROM 0-115, pain decreased to 2/10 and no edema present at this point.  Pt currently  wtih diffiuclty progressing to higher level functional activity due to Lt sciatica pain.  Noted improvement today wtih increased elasticity and reduced scar tissue in Rt knee. Added vector stance to Rt only with some discomfort voiced from elevating Lt LE.  PT unable to complete therex on Lt side due to pain.  PT was walking 1/4 block around her appt complex, however has not completed lately due to this pain.  PT has 2 more visits remaining for Rt knee prior to re-evaluation.     Rehab Potential  Good    PT Frequency  2x / week    PT Duration  8 weeks    PT Treatment/Interventions  Biofeedback;Cryotherapy;Electrical Stimulation;Ultrasound;Moist Heat;Iontophoresis 6m/ml Dexamethasone;DME Instruction;Gait training;Stair training;Functional  mobility training;Therapeutic activities;Therapeutic exercise;Patient/family education;Neuromuscular re-education;Balance training;Scar mobilization;Passive range of motion;Dry needling;Energy conservation;Taping    PT Next Visit Plan  Continue with knee flexion and strengthening of her Rt gluteal medius and maximus mm .  Pt will require Wedge with 2 pillows for tolerance with supine position.  Next session begin vector stance on Rt LE only to target glute.  Progress as able due to Lt sciatic pain limiting activity and difficulty placing full weight onto Lt.     PT Home Exercise Plan  initial - HHPT HEP but with increased hold timesas patient not holding exercises; 10/21/17 - heel/toe raises, hamstring/calf stretches, scar massage       Patient will benefit from skilled therapeutic intervention in order to improve the following deficits and impairments:  Abnormal gait, Pain, Improper body mechanics, Decreased mobility, Decreased activity tolerance, Decreased endurance, Decreased balance, Decreased range of motion, Decreased strength, Difficulty walking  Visit Diagnosis: Difficulty in walking, not elsewhere classified  Unsteadiness on feet  Acute pain of right  knee  Muscle weakness (generalized)     Problem List Patient Active Problem List   Diagnosis Date Noted  . Insomnia 12/13/2016  . Preventative health care 09/08/2014  . Ovarian cyst 08/13/2014  . Preoperative cardiovascular examination 08/13/2014  . Abnormal EKG 08/13/2014  . PVC's (premature ventricular contractions)   . Fracture of left distal radius 12/30/2013  . Spinal stenosis of lumbar region 06/24/2013  . Atherosclerosis of aorta ([place]) 05/06/2013  . Osteopenia 03/06/2013  . Edema 11/05/2012  . Myalgia 04/11/2011  . Night sweats 12/27/2010  . Contact lens/glasses fitting 12/27/2010  . Menopause 12/27/2010  . Family history of breast cancer 12/27/2010  . ADJUSTMENT DISORDER WITH ANXIOUS MOOD 05/26/2010  . PULMONARY NODULE, LEFT UPPER LOBE 12/15/2009  . CONSTIPATION, DRUG INDUCED 08/19/2009  . KNEE PAIN, LEFT 09/13/2008  . THYROID NODULE, LEFT 05/26/2008  . GERD 05/26/2008  . INSOMNIA 05/06/2008  . Anemia 10/24/2007  . Hypothyroidism 08/12/2007  . Hyperlipidemia 08/12/2007  . TOBACCO ABUSE 08/12/2007  . Essential hypertension 08/12/2007  . OA (osteoarthritis) of knee 08/12/2007  . Hx of colonic polyps 08/28/2004   Teena Irani, PTA/CLT [phone]  Teena Irani 11/18/2017, 4:06 PM  [facility] [address] [place], [place], [address] Phone: [phone]   Fax:  [phone]  Name: AZHAR YOGI MRN: [id number] Date of Birth: 21-Oct-1945

## 2017-11-20 ENCOUNTER — Ambulatory Visit (HOSPITAL_COMMUNITY): Payer: Medicare Other | Admitting: Physical Therapy

## 2017-11-20 DIAGNOSIS — R262 Difficulty in walking, not elsewhere classified: Secondary | ICD-10-CM | POA: Diagnosis not present

## 2017-11-20 DIAGNOSIS — M6281 Muscle weakness (generalized): Secondary | ICD-10-CM

## 2017-11-20 DIAGNOSIS — R2681 Unsteadiness on feet: Secondary | ICD-10-CM | POA: Diagnosis not present

## 2017-11-20 DIAGNOSIS — M25561 Pain in right knee: Secondary | ICD-10-CM | POA: Diagnosis not present

## 2017-11-20 NOTE — Therapy (Signed)
[facility] [address] [place], [place], [address] Phone: [phone]   Fax:  [phone]  Physical Therapy Treatment / Re-assessment / Discharge Summary  Patient Details  Name: Joan [name] MRN: [id number] Date of Birth: 11/19/1945 Referring Provider: Aluisio  As a licensed physical therapist I have read and agree with the below note.  Clarene Critchley PT, DPT 3:21 PM, 11/20/17 [phone]  Progress Note Reporting Period 10/30/17 to 11/20/17  See note below for Objective Data and Assessment of Progress/Goals.       Encounter Date: 11/20/2017  PT End of Session - 11/20/17 1335    Visit Number  15    Number of Visits  16    Date for PT Re-Evaluation  12/04/17    Authorization Type  [employer] Medicare AARP Plan 1 HMO complete; no deduct; copay $40/visit, coins OOP 4400 - $804.24    Authorization Time Period  medical necessity, no auth, no ref; cert 4/65--> 6/81/2751    Authorization - Visit Number  15    Authorization - Number of Visits  16    PT Start Time  1304    PT Stop Time  1342    PT Time Calculation (min)  38 min    Activity Tolerance  Patient limited by pain;Patient tolerated treatment well    Behavior During Therapy  [facility] for tasks assessed/performed       Past Medical History:  Diagnosis Date  . Arthritis    "shoulders; wrist; probably spine" (06/24/2013)  . Chronic low back pain    followed by Dr Hardin Negus pain mgt  . Colon polyp   . Constipation   . Esophageal stricture   . Finger pain, left    2 fingers on left hand since wrist surgery  . GERD (gastroesophageal reflux disease)   . Heart murmur    "slight; not on RX" (06/24/2013)  . History of cardiac arrhythmia    cardiologist- Traci Turner  . Hx of colonic polyps 08/28/2004  . Hyperlipemia   . Hypertension   . Hypothyroidism   . Osteoarthritis of left knee    advanced  . PONV (postoperative nausea and vomiting)    Pt reports symptoms are the result of  gallbladder and cholecystectomy, not anesthesia  . PVC's (premature ventricular contractions)   . Sleep apnea 1990's   "tested; tried mask; couldn't stand it; told me as long as I slept on my side I'd be ok" (06/24/2013)  . Thyroid nodule   . Tobacco abuse     Past Surgical History:  Procedure Laterality Date  . ABDOMINAL HYSTERECTOMY  1988   "partial" (06/24/2013)  . ABDOMINAL HYSTERECTOMY     partial in 1988  . ANKLE SURGERY Left 1995   "tendon repair" (06/24/2013)  . BACK SURGERY     "think today was my 8th back OR" (06/24/2013)  . CERVICAL SPINE SURGERY  2012  . [place]  . CHOLECYSTECTOMY N/A 04/16/2013   Procedure: LAPAROSCOPIC CHOLECYSTECTOMY ;  Surgeon: Imogene Burn. Georgette Dover, MD;  [facility];  Service: General;  Laterality: N/A;  . ELBOW SURGERY  1990's  . FOOT SURGERY Right 2012   SPUR REMOVED  . INFUSION PUMP IMPLANTATION  1990's   "implantablet morphine pump; took it out w/in 11 months  . KNEE ARTHROSCOPY Left 1991; ~ 1993  . LAPAROSCOPIC LYSIS OF ADHESIONS N/A 04/16/2013   Procedure: LAPAROSCOPIC LYSIS OF ADHESIONS;  Surgeon: Imogene Burn. Tsuei, MD;  [facility];  Service:  General;  Laterality: N/A;  . LUMBAR FUSION     and rods  . LUMBAR LAMINECTOMY/DECOMPRESSION MICRODISCECTOMY N/A 11/28/2012   Procedure: DECOMPRESSIVE LUMBAR LAMINECTOMY LEVEL 1;  Surgeon: Elaina Hoops, MD;  Location: [facility] [place] [facility];  Service: Neurosurgery;  Laterality: N/A;  DECOMPRESSIVE LUMBAR LAMINECTOMY LEVEL 1  . ORIF DISTAL RADIUS FRACTURE Left 12/30/2013   dr Caralyn Guile  . ORIF WRIST FRACTURE Left 12/30/2013   Procedure: OPEN REDUCTION INTERNAL FIXATION (ORIF) LEFT WRIST FRACTURE AND REPAIR AS INDICATED;  Surgeon: Linna Hoff, MD;  Location: [facility];  Service: Orthopedics;  Laterality: Left;  . POSTERIOR FUSION LUMBAR SPINE  06/24/2013  . ROBOTIC ASSISTED SALPINGO OOPHERECTOMY Bilateral 08/20/2014   Procedure: ROBOTIC ASSISTED SALPINGO OOPHORECTOMY;  Surgeon: Daria Pastures,  MD;  Location: [facility];  Service: Gynecology;  Laterality: Bilateral;  . SHOULDER ARTHROSCOPY Left 09/2011  . THYROIDECTOMY  2012  . TOTAL KNEE ARTHROPLASTY Right 09/09/2017   Procedure: RIGHT TOTAL KNEE ARTHROPLASTY;  Surgeon: Gaynelle Arabian, MD;  [facility];  Service: Orthopedics;  Laterality: Right;    There were no vitals filed for this visit.  Subjective Assessment - 11/20/17 1317    Subjective  PT states she returned to MD yesterday and he is super pleased with her progress and stated she can be done with therapy for her Lt knee at this point.  Reports he started her on prednisone for her Lt sciatic pain and will refer her back to her back MD if this doesnt help.  Reports she is going to check into silver sneakers at the [facility] and also the senior center as MD wants her to continue using the bike.  MD instructed no walking program at this time due to flairing up her back.  Currently 5/10 in her Rt knee as she has not taken any medicine but varies 0-7/10.      Currently in Pain?  Yes    Pain Score  5     Pain Location  Knee    Pain Orientation  Right    Pain Descriptors / Indicators  Aching         [facility] PT Assessment - 11/20/17 0001      Assessment   Medical Diagnosis  R TKA     Referring Provider  Aluisio    Onset Date/Surgical Date  09/09/17    Next MD Visit  august 2019      Precautions   Precautions  None      Observation/Other Assessments   Focus on Therapeutic Outcomes (FOTO)   FOTO 38% limited (was 53%)      AROM   Right/Left Knee  Right    Right Knee Extension  0 was lacking 3 degrees    Right Knee Flexion  120 was 83 degrees      Strength   Right Hip Flexion  5/5    Right Hip ABduction  5/5    Left Hip Flexion  5/5    Right Knee Flexion  5/5    Right Knee Extension  5/5    Left Knee Flexion  5/5    Left Knee Extension  5/5      Ambulation/Gait   Ambulation/Gait  Yes    Ambulation/Gait Assistance  6: Modified independent (Device/Increase time)     Ambulation Distance (Feet)  690 Feet    Assistive device  Straight cane    Gait Pattern  Step-through pattern;Decreased stance time - left;Decreased hip/knee flexion - right;Right flexed knee in stance;Antalgic;Poor foot  clearance - right    Ambulation Surface  Level;Indoor    Gait velocity  3.8 m/s                   [facility] Adult PT Treatment/Exercise - 11/20/17 0001      Knee/Hip Exercises: Stretches   Knee: Self-Stretch to increase Flexion  10 seconds;Other (comment);Right    Knee: Self-Stretch Limitations  knee drive on [employer] step    Gastroc Stretch  Right;3 reps;30 seconds    Gastroc Stretch Limitations  slantboard      Knee/Hip Exercises: Aerobic   Recumbent Bike  seat 10--> 25fll revolution x5 min; no charge      Knee/Hip Exercises: Supine   Quad Sets  Right;5 reps    Heel Slides  Right;10 reps             PT Education - 11/20/17 1345    Education provided  Yes    Education Details  given information on [place] instructions to continue HEP    Person(s) Educated  Patient    Methods  Explanation;Handout;Demonstration    Comprehension  Verbalized understanding;Returned demonstration       PT Short Term Goals - 11/20/17 1327      PT SHORT TERM GOAL #1   Title  pt independent in intial HEP    Time  2    Period  Weeks    Status  Achieved      PT SHORT TERM GOAL #2   Title  Increase Rt knee AROM extension to < 10 degrees and flexion to > 95 to improve gait    Baseline  13 to 86  4/24: 3-106    Time  2    Period  Weeks    Status  Achieved      PT SHORT TERM GOAL #3   Title  Patient will exhibit in in MMT by 1/2 grade for bilateral lower extremities to improve endurance and ability to walk > 2 blocks    Baseline  4/24:  walking 1, 1/4 blocks around her apartment complex  5/15:  MMT goal met, however unable to walk more than 1/4 block due to Lt sciatic issues    Time  2    Period  Weeks    Status  Partially Met      PT SHORT TERM GOAL  #4   Title  Patient will have a reduction in pain so she is able to return to using OTC pain medications.    Baseline  will not be accomplished as pt normally takes prescription meds for her back; goal is to take 3 instead of 5    Time  2    Period  Weeks    Status  Revised        PT Long Term Goals - 11/20/17 1328      PT LONG TERM GOAL #1   Title  pt independent with advanced HEP as necessary    Time  4    Period  Weeks    Status  Achieved      PT LONG TERM GOAL #2   Title  Increase Rt knee AROM extension to < 5 degrees and flexion to > 105 to improve gait    Baseline  4/24: 3-106    Time  4    Period  Weeks    Status  Achieved      PT LONG TERM GOAL #3   Title  Patient/s FOTO limitation  score will improve by 10 points to exhibit improved functional abilities.    Time  4    Period  Weeks    Status  Achieved      PT LONG TERM GOAL #4   Title  Patient will have pain no greater than 2/10 to encourage greater movement and activities.    Baseline  5/13:  Rt knee is 2/10 today, however still having alot of pain in Lt LE due to sciatica    Time  4    Period  Weeks    Status  Partially Met            Plan - 11/20/17 1342    Clinical Impression Statement  Pt returns today following MD appt yesterday reporting instructions from MD to be discharged from therapy.  Pt has made progress meeting 2/3 STG's and 4/5 LTG's.  The goals unmet were due to Lt sciatic pain that she has just began a prednisone pack for.  Pt given information regarding [employer] silver sneakers and the [facility] to continue ROM for Rt knee as instructed by MD.  Pt verbalizes readiness to be discharged at this time and agrees to compliance and continuation of HEP.  Pt without questions or concerns.     Rehab Potential  Good    PT Frequency  2x / week    PT Duration  8 weeks    PT Treatment/Interventions  Biofeedback;Cryotherapy;Electrical Stimulation;Ultrasound;Moist Heat;Iontophoresis 79m/ml  Dexamethasone;DME Instruction;Gait training;Stair training;Functional mobility training;Therapeutic activities;Therapeutic exercise;Patient/family education;Neuromuscular re-education;Balance training;Scar mobilization;Passive range of motion;Dry needling;Energy conservation;Taping    PT Next Visit Plan  Continue with knee flexion and strengthening of her Rt gluteal medius and maximus mm .  Pt will require Wedge with 2 pillows for tolerance with supine position.  Next session begin vector stance on Rt LE only to target glute.  Progress as able due to Lt sciatic pain limiting activity and difficulty placing full weight onto Lt.     PT Home Exercise Plan  initial - HHPT HEP but with increased hold timesas patient not holding exercises; 10/21/17 - heel/toe raises, hamstring/calf stretches, scar massage       Patient will benefit from skilled therapeutic intervention in order to improve the following deficits and impairments:  Abnormal gait, Pain, Improper body mechanics, Decreased mobility, Decreased activity tolerance, Decreased endurance, Decreased balance, Decreased range of motion, Decreased strength, Difficulty walking  Visit Diagnosis: Difficulty in walking, not elsewhere classified  Unsteadiness on feet  Acute pain of right knee  Muscle weakness (generalized)     Problem List Patient Active Problem List   Diagnosis Date Noted  . Insomnia 12/13/2016  . Preventative health care 09/08/2014  . Ovarian cyst 08/13/2014  . Preoperative cardiovascular examination 08/13/2014  . Abnormal EKG 08/13/2014  . PVC's (premature ventricular contractions)   . Fracture of left distal radius 12/30/2013  . Spinal stenosis of lumbar region 06/24/2013  . Atherosclerosis of aorta ([place] 05/06/2013  . Osteopenia 03/06/2013  . Edema 11/05/2012  . Myalgia 04/11/2011  . Night sweats 12/27/2010  . Contact lens/glasses fitting 12/27/2010  . Menopause 12/27/2010  . Family history of breast cancer 12/27/2010   . ADJUSTMENT DISORDER WITH ANXIOUS MOOD 05/26/2010  . PULMONARY NODULE, LEFT UPPER LOBE 12/15/2009  . CONSTIPATION, DRUG INDUCED 08/19/2009  . KNEE PAIN, LEFT 09/13/2008  . THYROID NODULE, LEFT 05/26/2008  . GERD 05/26/2008  . INSOMNIA 05/06/2008  . Anemia 10/24/2007  . Hypothyroidism 08/12/2007  . Hyperlipidemia 08/12/2007  . TOBACCO ABUSE 08/12/2007  .  Essential hypertension 08/12/2007  . OA (osteoarthritis) of knee 08/12/2007  . Hx of colonic polyps 08/28/2004   Teena Irani, PTA/CLT [phone]  Mare Ferrari, Amy B 11/20/2017, 1:50 PM   PHYSICAL THERAPY DISCHARGE SUMMARY  Visits from Start of Care: 15  Current functional level related to goals / functional outcomes: See above   Remaining deficits: See above   Education / Equipment: Patient was provided with HEP and community resources. See above for further detail.  Plan: Patient agrees to discharge.  Patient goals were partially met. Patient is being discharged due to being pleased with the current functional level.  ?????  Also, patient stated that physician was pleased with her current functional level based on her knee.   Clarene Critchley PT, DPT 3:31 PM, 11/20/17 [place], [place], [address] Phone: [phone]   Fax:  [phone]  Name: DAWNISHA MARQUINA MRN: [id number] Date of Birth: 01-17-1946

## 2017-11-22 ENCOUNTER — Encounter (HOSPITAL_COMMUNITY): Payer: Medicare Other

## 2017-11-25 ENCOUNTER — Encounter (HOSPITAL_COMMUNITY): Payer: Medicare Other | Admitting: Physical Therapy

## 2017-11-28 ENCOUNTER — Encounter (HOSPITAL_COMMUNITY): Payer: Medicare Other | Admitting: Physical Therapy

## 2017-11-29 ENCOUNTER — Encounter (HOSPITAL_COMMUNITY): Payer: Medicare Other | Admitting: Physical Therapy

## 2017-12-30 ENCOUNTER — Encounter: Payer: Self-pay | Admitting: Family Medicine

## 2017-12-30 ENCOUNTER — Ambulatory Visit (INDEPENDENT_AMBULATORY_CARE_PROVIDER_SITE_OTHER): Payer: Medicare Other | Admitting: Family Medicine

## 2017-12-30 VITALS — BP 122/68 | HR 72 | Temp 98.1°F | Resp 16 | Wt 148.0 lb

## 2017-12-30 DIAGNOSIS — I10 Essential (primary) hypertension: Secondary | ICD-10-CM

## 2017-12-30 DIAGNOSIS — M62830 Muscle spasm of back: Secondary | ICD-10-CM | POA: Diagnosis not present

## 2017-12-30 DIAGNOSIS — G4700 Insomnia, unspecified: Secondary | ICD-10-CM

## 2017-12-30 DIAGNOSIS — E785 Hyperlipidemia, unspecified: Secondary | ICD-10-CM | POA: Diagnosis not present

## 2017-12-30 DIAGNOSIS — D649 Anemia, unspecified: Secondary | ICD-10-CM

## 2017-12-30 DIAGNOSIS — L03119 Cellulitis of unspecified part of limb: Secondary | ICD-10-CM

## 2017-12-30 DIAGNOSIS — R600 Localized edema: Secondary | ICD-10-CM

## 2017-12-30 DIAGNOSIS — R609 Edema, unspecified: Secondary | ICD-10-CM | POA: Diagnosis not present

## 2017-12-30 DIAGNOSIS — M48061 Spinal stenosis, lumbar region without neurogenic claudication: Secondary | ICD-10-CM | POA: Diagnosis not present

## 2017-12-30 DIAGNOSIS — M961 Postlaminectomy syndrome, not elsewhere classified: Secondary | ICD-10-CM | POA: Diagnosis not present

## 2017-12-30 DIAGNOSIS — G894 Chronic pain syndrome: Secondary | ICD-10-CM | POA: Diagnosis not present

## 2017-12-30 DIAGNOSIS — Z79891 Long term (current) use of opiate analgesic: Secondary | ICD-10-CM | POA: Diagnosis not present

## 2017-12-30 LAB — COMPREHENSIVE METABOLIC PANEL
ALT: 7 U/L (ref 0–35)
AST: 14 U/L (ref 0–37)
Albumin: 4.4 g/dL (ref 3.5–5.2)
Alkaline Phosphatase: 52 U/L (ref 39–117)
BUN: 24 mg/dL — ABNORMAL HIGH (ref 6–23)
CO2: 31 mEq/L (ref 19–32)
Calcium: 9.4 mg/dL (ref 8.4–10.5)
Chloride: 101 mEq/L (ref 96–112)
Creatinine, Ser: 1.06 mg/dL (ref 0.40–1.20)
GFR: 53.99 mL/min — ABNORMAL LOW (ref >60.00)
Glucose, Bld: 98 mg/dL (ref 70–99)
Potassium: 4.4 mEq/L (ref 3.5–5.1)
Sodium: 139 mEq/L (ref 135–145)
Total Bilirubin: 0.5 mg/dL (ref 0.2–1.2)
Total Protein: 6.7 g/dL (ref 6.0–8.3)

## 2017-12-30 LAB — LIPID PANEL
Cholesterol: 151 mg/dL (ref 0–200)
HDL: 69.3 mg/dL (ref >39.00)
LDL Cholesterol: 61 mg/dL (ref 0–99)
NonHDL: 81.93
Total CHOL/HDL Ratio: 2
Triglycerides: 103 mg/dL (ref 0.0–149.0)
VLDL: 20.6 mg/dL (ref 0.0–40.0)

## 2017-12-30 LAB — HEMOGLOBIN A1C: Hgb A1c MFr Bld: 6 % (ref 4.6–6.5)

## 2017-12-30 MED ORDER — SPIRONOLACTONE 25 MG PO TABS: 25.0000 mg | ORAL_TABLET | Freq: Every day | ORAL | 2 refills | Status: DC

## 2017-12-30 MED ORDER — DOXYCYCLINE HYCLATE 100 MG PO TABS: 100.0000 mg | ORAL_TABLET | Freq: Two times a day (BID) | ORAL | 0 refills | Status: DC

## 2017-12-30 MED ORDER — TRAZODONE HCL 50 MG PO TABS: 50.0000 mg | ORAL_TABLET | Freq: Every day | ORAL | 1 refills | Status: DC

## 2017-12-30 NOTE — Patient Instructions (Signed)
Drug Allergy A drug allergy is when the body's disease-fighting system (immune system) reacts badly to a medicine. Drug allergies range from mild to severe, and they can be life-threatening in some cases. Some allergic reactions occur one week or more after you are exposed to a medicine (delayed reaction). A sudden (acute), severe allergic reaction that affects multiple areas of the body is called an anaphylactic reaction (anaphylaxis). Anaphylaxis can be life-threatening. All allergic reactions to a medicine require medical evaluation, even if an allergic reaction appears to be mild (minor). What are the causes? Drug allergies happen when the immune system wrongly identifies a medicine as being harmful. When this happens, the body releases proteins (antibodies) and other compounds, such as histamine, into the bloodstream. This causes swelling in certain tissues and loss of blood pressure to important areas, such as the heart and lungs. Almost any medicine can cause an allergic reaction. Medicines that commonly cause allergic reactions (are common allergens) include:  Penicillin.  Sulfa medicines (sulfonamides).  Medicines that numb certain areas of the body (local anesthetics).  X-ray dyes that contain iodine.  What are the signs or symptoms? Mild Allergic Reaction  Nasal congestion.  Tingling in the mouth.  An itchy, red rash. Severe Allergic Reaction  Swelling of the eyes, lips, face, or tongue.  Swelling of the back of the mouth and the throat.  Wheezing.  A hoarse voice.  Itchy, red, swollen areas of skin (hives).  Dizziness or light-headedness.  Fainting.  Anxiety or confusion.  Abdominal pain.  Difficulty breathing, speaking, or swallowing.  Chest tightness.  Fast or irregular heartbeats (palpitations).  Vomiting.  Diarrhea. How is this diagnosed? This condition is diagnosed from a physical exam and your history of recent exposure to one or more medicines.  You may be referred for follow-up testing by a health care provider who specializes in allergies. This testing can confirm the diagnosis of a drug allergy and determine which medicines you are allergic to. Testing may include:  Skin tests. These may involve: ? Injecting a small amount of the possible allergen between layers of your skin (intradermal injection). ? Applying patches to your skin.  Blood tests.  Drug challenge. For this test, a health care provider gives you a small amount of a medicine in gradual doses while watching for an allergic reaction.  If you are unsure of what caused your allergic reaction, your health care provider may ask you for:  Information about all medicines that you take on a regular basis, including: ? The name of each medicine. ? How much (dosage) and how many times you take each medicine per day. ? The form of each medicine, such as pill, liquid, or injection.  The date and time of your reaction.  How is this treated? There is no cure for allergies. However, an allergic reaction can be treated with:  Medicines that help: ? To reduce pain and swelling (NSAIDs). ? To relieve itching and hives (antihistamines). ? To reduce swelling (corticosteroids).  Respiratory inhalers. These are inhaled medicines that help to widen (dilate) the airways in your lungs.  Injections of medicine that helps to relax the muscles in your airways and tighten your blood vessels (epinephrine).  Severe allergic reactions, such as anaphylaxis, require immediate treatment in a hospital. If you have an anaphylactic reaction, you may need to be hospitalized for observation. You may be prescribed rescue medicines, such as epinephrine. Epinephrine comes in many forms, including what is commonly called an auto-injector "pen" (pre-filled automatic  epinephrine injection device). Follow these instructions at home: If You Have a Severe Allergy  Always keep an auto-injector pen or your  anaphylaxis kit near you. These can be lifesaving if you have a severe reaction. Use your auto-injector pen or anaphylaxis kit as told by your health care provider.  Make sure that you, the members of your household, and your employer know: ? How to use an anaphylaxis kit. ? How to use an auto-injector pen to give you an epinephrine injection.  Replace your epinephrine immediately after you use your auto-injector pen, in case you have another reaction.  Wear a medical alert bracelet or necklace that states your drug allergy, if told by your health care provider. General instructions  Avoidmedicines that you are allergic to.  Take over-the-counter and prescription medicines only as told by your health care provider.  If you were given medicines to treat your reaction, do not drive until your health care provider approves.  If you have hives or a rash: ? Use an over-the-counter antihistamineas told by your health care provider. ? Apply cold, wet cloths (cold compresses) to your skin or take baths or showers in cool water. Avoid hot water.  If you had tests done, it is your responsibility to get your test results. Ask your health care provider when your results will be ready.  Tell any health care providers who care for you that you have a drug allergy.  Keep all follow-up visits as told by your health care provider. This is important. Contact a health care provider if:  You think that you are having an allergic reaction. Symptoms of an allergic reaction usually start within 30 minutes after you are exposed to a medicine.  You have symptoms that last more than 2 days after your reaction.  Your symptoms get worse.  You develop new symptoms. Get help right away if:  You needed to use epinephrine. ? An epinephrine injection helps to manage life-threatening allergic reactions, but you still need to go to the emergency room even if epinephrine seems to work. This is important because  anaphylaxis may happen again within 72 hours (rebound anaphylaxis). ? If you used epinephrine to treat anaphylaxis outside of the hospital, you need additional medical care. This may include more doses of epinephrine.  You develop symptoms of a severe allergic reaction. These symptoms may represent a serious problem that is an emergency. Do not wait to see if the symptoms will go away. Use your auto-injector pen or anaphylaxis kit as you have been instructed, and get medical help right away. Call your local emergency services (911 in the [place] Do not drive yourself to the hospital. This information is not intended to replace advice given to you by your health care provider. Make sure you discuss any questions you have with your health care provider. Document Released: 06/25/2005 Document Revised: 11/04/2015 Document Reviewed: 01/25/2015 Elsevier Interactive Patient Education  [employer].

## 2017-12-30 NOTE — Assessment & Plan Note (Signed)
Encouraged heart healthy diet, increase exercise, avoid trans fats, consider a krill oil cap daily 

## 2017-12-30 NOTE — Assessment & Plan Note (Signed)
Per neurosurgery  

## 2017-12-30 NOTE — Assessment & Plan Note (Signed)
Stable. Refill meds

## 2017-12-30 NOTE — Assessment & Plan Note (Signed)
Change diuretic -- spironolactone Elevate legs  Compression socks

## 2017-12-30 NOTE — Assessment & Plan Note (Signed)
Well controlled, no changes to meds. Encouraged heart healthy diet such as the DASH diet and exercise as tolerated.  °

## 2017-12-30 NOTE — Progress Notes (Signed)
Subjective:  I acted as a Education administrator for [employer]. Yancey Flemings, [place]   Patient ID: LISAANN ATHA, female    DOB: [birth year], 72 y.o.   MRN: [id number]  Chief Complaint  Patient presents with  . Leg Pain    right covered with blisters    HPI  Patient is in today for leg pain.  Both legs are red and hot and have blisters over them.  No calf pain.  No cp or sob.  Pt is seeing dr Saintclair Halsted for back/hip pain and dr Maureen Ralphs for knee/ hip  Pt states she  Noticed the rash/ redness after she started lasix daily.    Patient Care Team: Carollee Herter, Alferd Apa, DO as PCP - General (Family Medicine) Sueanne Margarita, MD as PCP - Cardiology (Cardiology) Nicholaus Bloom, MD as Consulting Physician (Anesthesiology) Kary Kos, MD as Consulting Physician (Neurosurgery) Iran Planas, MD as Consulting Physician (Orthopedic Surgery) Justice Britain, MD as Consulting Physician (Orthopedic Surgery) Jarome Matin, MD as Consulting Physician (Dermatology) Bobbye Charleston, MD as Consulting Physician (Obstetrics and Gynecology) Barbaraann Cao, OD as Referring Physician (Optometry) Gatha Mayer, MD as Consulting Physician (Gastroenterology) Gaynelle Arabian, MD as Consulting Physician (Orthopedic Surgery) Gaynelle Arabian, MD as Consulting Physician (Orthopedic Surgery)   Past Medical History:  Diagnosis Date  . Arthritis    "shoulders; wrist; probably spine" (06/24/2013)  . Chronic low back pain    followed by Dr Hardin Negus pain mgt  . Colon polyp   . Constipation   . Esophageal stricture   . Finger pain, left    2 fingers on left hand since wrist surgery  . GERD (gastroesophageal reflux disease)   . Heart murmur    "slight; not on RX" (06/24/2013)  . History of cardiac arrhythmia    cardiologist- Traci Turner  . Hx of colonic polyps 08/28/2004  . Hyperlipemia   . Hypertension   . Hypothyroidism   . Osteoarthritis of left knee    advanced  . PONV (postoperative nausea and vomiting)    Pt reports  symptoms are the result of gallbladder and cholecystectomy, not anesthesia  . PVC's (premature ventricular contractions)   . Sleep apnea 1990's   "tested; tried mask; couldn't stand it; told me as long as I slept on my side I'd be ok" (06/24/2013)  . Thyroid nodule   . Tobacco abuse     Past Surgical History:  Procedure Laterality Date  . ABDOMINAL HYSTERECTOMY  1988   "partial" (06/24/2013)  . ABDOMINAL HYSTERECTOMY     partial in 1988  . ANKLE SURGERY Left 1995   "tendon repair" (06/24/2013)  . BACK SURGERY     "think today was my 8th back OR" (06/24/2013)  . CERVICAL SPINE SURGERY  2012  . [place]  . CHOLECYSTECTOMY N/A 04/16/2013   Procedure: LAPAROSCOPIC CHOLECYSTECTOMY ;  Surgeon: Imogene Burn. Georgette Dover, MD;  Location: [facility];  Service: General;  Laterality: N/A;  . ELBOW SURGERY  1990's  . FOOT SURGERY Right 2012   SPUR REMOVED  . INFUSION PUMP IMPLANTATION  1990's   "implantablet morphine pump; took it out w/in 11 months  . KNEE ARTHROSCOPY Left 1991; ~ 1993  . LAPAROSCOPIC LYSIS OF ADHESIONS N/A 04/16/2013   Procedure: LAPAROSCOPIC LYSIS OF ADHESIONS;  Surgeon: Imogene Burn. Georgette Dover, MD;  Location: [facility];  Service: General;  Laterality: N/A;  . LUMBAR FUSION     and rods  . LUMBAR LAMINECTOMY/DECOMPRESSION MICRODISCECTOMY N/A 11/28/2012   Procedure: DECOMPRESSIVE LUMBAR LAMINECTOMY  LEVEL 1;  Surgeon: Elaina Hoops, MD;  Location: [facility];  Service: Neurosurgery;  Laterality: N/A;  DECOMPRESSIVE LUMBAR LAMINECTOMY LEVEL 1  . ORIF DISTAL RADIUS FRACTURE Left 12/30/2013   dr Caralyn Guile  . ORIF WRIST FRACTURE Left 12/30/2013   Procedure: OPEN REDUCTION INTERNAL FIXATION (ORIF) LEFT WRIST FRACTURE AND REPAIR AS INDICATED;  Surgeon: Linna Hoff, MD;  Location: [facility];  Service: Orthopedics;  Laterality: Left;  . POSTERIOR FUSION LUMBAR SPINE  06/24/2013  . ROBOTIC ASSISTED SALPINGO OOPHERECTOMY Bilateral 08/20/2014   Procedure: ROBOTIC ASSISTED SALPINGO OOPHORECTOMY;   Surgeon: Daria Pastures, MD;  Location: [facility];  Service: Gynecology;  Laterality: Bilateral;  . SHOULDER ARTHROSCOPY Left 09/2011  . THYROIDECTOMY  2012  . TOTAL KNEE ARTHROPLASTY Right 09/09/2017   Procedure: RIGHT TOTAL KNEE ARTHROPLASTY;  Surgeon: Gaynelle Arabian, MD;  Location: [facility];  Service: Orthopedics;  Laterality: Right;    Family History  Problem Relation Age of Onset  . Pancreatic cancer Father   . Cancer Father   . Diabetes type II Mother   . Hypertension Mother   . Coronary artery disease Mother 40  . Diabetes Mother   . Thyroid cancer Mother   . Skin cancer Mother   . Diabetes Sister   . Hypertension Sister   . Bone cancer Sister   . Breast cancer Sister   . Hypertension Brother   . Breast cancer Maternal Aunt   . Other Neg Hx        No family history of  colon cancer    Social History   Socioeconomic History  . Marital status: Divorced    Spouse name: Not on file  . Number of children: Not on file  . Years of education: Not on file  . Highest education level: Not on file  Occupational History  . Not on file  Social Needs  . Financial resource strain: Not on file  . Food insecurity:    Worry: Not on file    Inability: Not on file  . Transportation needs:    Medical: Not on file    Non-medical: Not on file  Tobacco Use  . Smoking status: Current Every Day Smoker    Packs/day: 1.00    Years: 35.00    Pack years: 35.00    Types: Cigarettes    Last attempt to quit: 11/22/2015    Years since quitting: 2.1  . Smokeless tobacco: Never Used  Substance and Sexual Activity  . Alcohol use: No    Alcohol/week: 0.0 oz  . Drug use: No  . Sexual activity: Not Currently  Lifestyle  . Physical activity:    Days per week: Not on file    Minutes per session: Not on file  . Stress: Not on file  Relationships  . Social connections:    Talks on phone: Not on file    Gets together: Not on file    Attends religious service: Not on file    Active member of  club or organization: Not on file    Attends meetings of clubs or organizations: Not on file    Relationship status: Not on file  . Intimate partner violence:    Fear of current or ex partner: Not on file    Emotionally abused: Not on file    Physically abused: Not on file    Forced sexual activity: Not on file  Other Topics Concern  . Not on file  Social History Narrative   Divorced  Current Smoker  1 ppd -  20 yrs      Alcohol use-no       [employer] - laid off       Physician roster:   Dr. Elta Guadeloupe Philips - pain management   Dr. Philis Pique - GYN   Dr. Saintclair Halsted - Neurosurgery   Dr. Ronnald Ramp - dermatology   Dr. Caralyn Guile - orthopedics   Dr. Fransico Him - cardiology   Dr. Miller-ophthalmology    Outpatient Medications Prior to Visit  Medication Sig Dispense Refill  . bisacodyl (DULCOLAX) 5 MG EC tablet Take 5 mg by mouth at bedtime.  30 tablet   . carisoprodol (SOMA) 350 MG tablet Take 125 mg by mouth 3 (three) times daily. For muscle spasms    . dorzolamide-timolol (COSOPT) 22.3-6.8 MG/ML ophthalmic solution Place 1 drop into the left eye 2 (two) times daily.    . fenofibrate 160 MG tablet TAKE ONE TABLET BY MOUTH ONCE DAILY. 90 tablet 1  . levothyroxine (SYNTHROID, LEVOTHROID) 88 MCG tablet TAKE 1 TABLET BY MOUTH  DAILY BEFORE BREAKFAST 90 tablet 1  . potassium chloride SA (K-DUR,KLOR-CON) 20 MEQ tablet TAKE 3 TABLETS BY MOUTH  DAILY 270 tablet 1  . simvastatin (ZOCOR) 20 MG tablet TAKE 1 TABLET BY MOUTH AT  BEDTIME 90 tablet 1  . furosemide (LASIX) 20 MG tablet TAKE 1-2 TABLET BY MOUTH ONCE DAILY AS NEEDED FOR  FLUID 180 tablet 1  . hydrochlorothiazide (HYDRODIURIL) 25 MG tablet TAKE 1 TABLET BY MOUTH  DAILY 90 tablet 1  . iron polysaccharides (NIFEREX) 150 MG capsule Take 1 capsule (150 mg total) by mouth daily. 21 capsule 0  . magnesium gluconate (MAGONATE) 500 MG tablet Take 500 mg by mouth daily.    . rivaroxaban (XARELTO) 10 MG TABS tablet Take 1 tablet (10 mg  total) by mouth daily with breakfast. Take Xarelto for two and a half more weeks following discharge from the hospital, then discontinue Xarelto. Once the patient has completed the blood thinner regimen, then take a Baby 81 mg Aspirin daily for three more weeks. 19 tablet 0  . traZODone (DESYREL) 50 MG tablet Take 1 tablet (50 mg total) by mouth at bedtime. 90 tablet 1   No facility-administered medications prior to visit.     Allergies  Allergen Reactions  . Morphine And Related Swelling  . Amitriptyline Other (See Comments)    Leg swelling  . Gabapentin Itching    neurontin  . Triamterene Itching and Rash    Review of Systems  Constitutional: Negative for chills, fever and malaise/fatigue.  HENT: Negative for congestion and hearing loss.   Eyes: Negative for discharge.  Respiratory: Negative for cough, sputum production and shortness of breath.   Cardiovascular: Negative for chest pain, palpitations and leg swelling.  Gastrointestinal: Negative for abdominal pain, blood in stool, constipation, diarrhea, heartburn, nausea and vomiting.  Genitourinary: Negative for dysuria, frequency, hematuria and urgency.  Musculoskeletal: Negative for back pain, falls and myalgias.  Skin: Positive for rash.  Neurological: Negative for dizziness, sensory change, loss of consciousness, weakness and headaches.  Endo/Heme/Allergies: Negative for environmental allergies. Does not bruise/bleed easily.  Psychiatric/Behavioral: Negative for depression and suicidal ideas. The patient is not nervous/anxious and does not have insomnia.        Objective:    Physical Exam  Constitutional: She is oriented to person, place, and time. She appears well-developed and well-nourished.  HENT:  Head: Normocephalic and atraumatic.  Eyes: Conjunctivae and EOM are normal.  Neck: Normal range of motion. Neck supple. No JVD present. Carotid bruit is not present. No thyromegaly present.  Cardiovascular: Normal rate,  regular rhythm and normal heart sounds.  No murmur heard. Pulmonary/Chest: Effort normal and breath sounds normal. No respiratory distress. She has no wheezes. She has no rales. She exhibits no tenderness.  Musculoskeletal: She exhibits no edema.  Neurological: She is alert and oriented to person, place, and time.  Skin: Rash noted. Rash is vesicular. There is erythema.  Psychiatric: She has a normal mood and affect.  Nursing note and vitals reviewed.   BP 122/68 (BP Location: Left Arm, Patient Position: Sitting, Cuff Size: Normal)   Pulse 72   Temp 98.1 F (36.7 C) (Oral)   Resp 16   Wt 148 lb (67.1 kg)   SpO2 98%   BMI 28.43 kg/m  Wt Readings from Last 3 Encounters:  12/30/17 148 lb (67.1 kg)  09/09/17 160 lb (72.6 kg)  09/03/17 160 lb (72.6 kg)   BP Readings from Last 3 Encounters:  12/30/17 122/68  09/11/17 (!) 140/59  09/03/17 (!) 130/58     Immunization History  Administered Date(s) Administered  . Influenza Split 03/28/2012  . Influenza Whole 04/28/2007, 05/25/2008, 04/19/2009, 04/18/2010  . Influenza, High Dose Seasonal PF 04/13/2014, 04/04/2015, 04/30/2016, 03/14/2017  . Influenza,inj,Quad PF,6+ Mos 03/06/2013  . Pneumococcal Conjugate-13 06/07/2014  . Pneumococcal Polysaccharide-23 08/12/2007, 10/18/2015  . Td 10/23/2004, 09/08/2014  . Zoster 03/28/2012    Health Maintenance  Topic Date Due  . MAMMOGRAM  12/12/2017  . INFLUENZA VACCINE  02/06/2018  . COLONOSCOPY  03/27/2020  . TETANUS/TDAP  09/07/2024  . DEXA SCAN  Completed  . Hepatitis C Screening  Completed  . PNA vac Low Risk Adult  Completed    Lab Results  Component Value Date   WBC 7.8 09/11/2017   HGB 9.5 (L) 09/11/2017   HCT 27.9 (L) 09/11/2017   PLT 197 09/11/2017   GLUCOSE 98 12/30/2017   CHOL 151 12/30/2017   TRIG 103.0 12/30/2017   HDL 69.30 12/30/2017   LDLDIRECT 106.0 11/29/2015   LDLCALC 61 12/30/2017   ALT 7 12/30/2017   AST 14 12/30/2017   NA 139 12/30/2017   K 4.4  12/30/2017   CL 101 12/30/2017   CREATININE 1.06 12/30/2017   BUN 24 (H) 12/30/2017   CO2 31 12/30/2017   TSH 2.64 07/01/2017   INR 0.97 09/03/2017   HGBA1C 6.0 12/30/2017    Lab Results  Component Value Date   TSH 2.64 07/01/2017   Lab Results  Component Value Date   WBC 7.8 09/11/2017   HGB 9.5 (L) 09/11/2017   HCT 27.9 (L) 09/11/2017   MCV 92.4 09/11/2017   PLT 197 09/11/2017   Lab Results  Component Value Date   NA 139 12/30/2017   K 4.4 12/30/2017   CO2 31 12/30/2017   GLUCOSE 98 12/30/2017   BUN 24 (H) 12/30/2017   CREATININE 1.06 12/30/2017   BILITOT 0.5 12/30/2017   ALKPHOS 52 12/30/2017   AST 14 12/30/2017   ALT 7 12/30/2017   PROT 6.7 12/30/2017   ALBUMIN 4.4 12/30/2017   CALCIUM 9.4 12/30/2017   ANIONGAP 6 09/11/2017   GFR 53.99 (L) 12/30/2017   Lab Results  Component Value Date   CHOL 151 12/30/2017   Lab Results  Component Value Date   HDL 69.30 12/30/2017   Lab Results  Component Value Date   LDLCALC 61 12/30/2017   Lab Results  Component Value Date  TRIG 103.0 12/30/2017   Lab Results  Component Value Date   CHOLHDL 2 12/30/2017   Lab Results  Component Value Date   HGBA1C 6.0 12/30/2017         Assessment & Plan:   Problem List Items Addressed This Visit      Unprioritized   Anemia   Edema    Change diuretic -- spironolactone Elevate legs  Compression socks      Essential hypertension    Well controlled, no changes to meds. Encouraged heart healthy diet such as the DASH diet and exercise as tolerated.       Relevant Medications   spironolactone (ALDACTONE) 25 MG tablet   Hyperlipidemia    Encouraged heart healthy diet, increase exercise, avoid trans fats, consider a krill oil cap daily      Relevant Medications   spironolactone (ALDACTONE) 25 MG tablet   Insomnia    Stable  Refill meds      Relevant Medications   traZODone (DESYREL) 50 MG tablet   Spinal stenosis of lumbar region    Per neurosurgery        Other Visit Diagnoses    Lower extremity edema    -  Primary   Relevant Medications   spironolactone (ALDACTONE) 25 MG tablet   Cellulitis of lower extremity, unspecified laterality       Relevant Medications   doxycycline (VIBRA-TABS) 100 MG tablet      I have discontinued Jyoti M. Mallek's magnesium gluconate, furosemide, iron polysaccharides, rivaroxaban, and hydrochlorothiazide. I am also having her start on spironolactone and doxycycline. Additionally, I am having her maintain her carisoprodol, bisacodyl, dorzolamide-timolol, simvastatin, fenofibrate, potassium chloride SA, levothyroxine, and traZODone.  Meds ordered this encounter  Medications  . spironolactone (ALDACTONE) 25 MG tablet    Sig: Take 1 tablet (25 mg total) by mouth daily.    Dispense:  30 tablet    Refill:  2  . traZODone (DESYREL) 50 MG tablet    Sig: Take 1 tablet (50 mg total) by mouth at bedtime.    Dispense:  90 tablet    Refill:  1    Please consider 90 day supplies to promote better adherence  . doxycycline (VIBRA-TABS) 100 MG tablet    Sig: Take 1 tablet (100 mg total) by mouth 2 (two) times daily.    Dispense:  20 tablet    Refill:  0    CMA served as scribe during this visit. History, Physical and Plan performed by medical provider. Documentation and orders reviewed and attested to.  [name], DO

## 2018-01-02 DIAGNOSIS — M545 Low back pain: Secondary | ICD-10-CM | POA: Diagnosis not present

## 2018-01-02 DIAGNOSIS — M419 Scoliosis, unspecified: Secondary | ICD-10-CM | POA: Diagnosis not present

## 2018-01-03 ENCOUNTER — Other Ambulatory Visit: Payer: Self-pay | Admitting: Family Medicine

## 2018-01-03 DIAGNOSIS — E785 Hyperlipidemia, unspecified: Secondary | ICD-10-CM

## 2018-01-06 ENCOUNTER — Other Ambulatory Visit (HOSPITAL_COMMUNITY): Payer: Self-pay | Admitting: Neurosurgery

## 2018-01-06 DIAGNOSIS — M419 Scoliosis, unspecified: Secondary | ICD-10-CM

## 2018-01-06 DIAGNOSIS — M25552 Pain in left hip: Secondary | ICD-10-CM

## 2018-01-13 ENCOUNTER — Telehealth: Payer: Self-pay | Admitting: Family Medicine

## 2018-01-13 NOTE — Telephone Encounter (Signed)
Copied from [place] [phone]. Topic: Quick Communication - See Telephone Encounter >> Jan 13, 2018  2:24 PM Percell Belt A wrote: CRM for notification. See Telephone encounter for: 01/13/18.  Pt called in and stated that she was put on a different kind of fluid pills the last part of June. She state that the new fluid pill is not working and would like to see if there is anything else she can try    [place] in [place]

## 2018-01-14 ENCOUNTER — Other Ambulatory Visit (HOSPITAL_COMMUNITY): Payer: Self-pay | Admitting: Neurosurgery

## 2018-01-14 ENCOUNTER — Ambulatory Visit (HOSPITAL_COMMUNITY)
Admission: RE | Admit: 2018-01-14 | Discharge: 2018-01-14 | Disposition: A | Discharge status: Home / Self Care | Payer: Medicare Other | Source: Ambulatory Visit | Attending: Neurosurgery | Admitting: Neurosurgery

## 2018-01-14 DIAGNOSIS — M25552 Pain in left hip: Secondary | ICD-10-CM

## 2018-01-14 DIAGNOSIS — M419 Scoliosis, unspecified: Secondary | ICD-10-CM | POA: Diagnosis not present

## 2018-01-14 DIAGNOSIS — M545 Low back pain: Secondary | ICD-10-CM | POA: Diagnosis not present

## 2018-01-14 DIAGNOSIS — Z981 Arthrodesis status: Secondary | ICD-10-CM | POA: Diagnosis not present

## 2018-01-14 NOTE — Telephone Encounter (Signed)
Notified pt of below. She denies chest pain or shortness of breath. She states she is not able to come in for OV at this time due to workup of hip pain. She is having CT today, MRI tomorrow and sees specialist on Thursday. Advised pt if swelling no better in the next couple of days she should come to office for further evaluation and if develops chest pain / shortness of breath she should go to the ER. Pt states if this doesn't help she is going back on previous medication. Advised pt she may not want to do that per last office note that mentioned rash after taking lasix. Again advised pt of need for office visit if swelling not better with increased dose of spironolactone.

## 2018-01-14 NOTE — Telephone Encounter (Signed)
Spoke with pt. She states she has been taking spironolactone 25mg  daily since 12/31/17 with no improvement in swelling of her feet. Reports that her feet are so swollen that she is unable to get flip flops on. She is not at home and has not weighed herself since last week at which time she was 149lb. She has not been able to get compression stockings yet but has been elevating her feet often. Pt states she cannot wait until follow up next to address her swelling. She wants to know if there is something else she can take?

## 2018-01-14 NOTE — Telephone Encounter (Signed)
noted 

## 2018-01-14 NOTE — Telephone Encounter (Signed)
Any shortness of breath/ cp?   She can inc spironolactone to 50 mg daily Can she come in sooner?

## 2018-01-15 ENCOUNTER — Ambulatory Visit (HOSPITAL_COMMUNITY)
Admission: RE | Admit: 2018-01-15 | Discharge: 2018-01-15 | Disposition: A | Discharge status: Home / Self Care | Payer: Medicare Other | Source: Ambulatory Visit | Attending: Neurosurgery | Admitting: Neurosurgery

## 2018-01-15 DIAGNOSIS — M1612 Unilateral primary osteoarthritis, left hip: Secondary | ICD-10-CM | POA: Diagnosis not present

## 2018-01-15 DIAGNOSIS — M25552 Pain in left hip: Secondary | ICD-10-CM | POA: Insufficient documentation

## 2018-01-16 DIAGNOSIS — M25552 Pain in left hip: Secondary | ICD-10-CM | POA: Diagnosis not present

## 2018-01-20 ENCOUNTER — Ambulatory Visit (INDEPENDENT_AMBULATORY_CARE_PROVIDER_SITE_OTHER): Payer: Medicare Other | Admitting: Family Medicine

## 2018-01-20 ENCOUNTER — Encounter: Payer: Self-pay | Admitting: Family Medicine

## 2018-01-20 ENCOUNTER — Ambulatory Visit (HOSPITAL_BASED_OUTPATIENT_CLINIC_OR_DEPARTMENT_OTHER)
Admission: RE | Admit: 2018-01-20 | Discharge: 2018-01-20 | Disposition: A | Discharge status: Home / Self Care | Payer: Medicare Other | Source: Ambulatory Visit | Attending: Family Medicine | Admitting: Family Medicine

## 2018-01-20 VITALS — BP 116/70 | HR 65 | Temp 98.0°F | Resp 16 | Ht 61.0 in | Wt 154.4 lb

## 2018-01-20 DIAGNOSIS — R600 Localized edema: Secondary | ICD-10-CM | POA: Insufficient documentation

## 2018-01-20 DIAGNOSIS — R609 Edema, unspecified: Secondary | ICD-10-CM

## 2018-01-20 DIAGNOSIS — L03119 Cellulitis of unspecified part of limb: Secondary | ICD-10-CM | POA: Diagnosis not present

## 2018-01-20 DIAGNOSIS — R918 Other nonspecific abnormal finding of lung field: Secondary | ICD-10-CM | POA: Diagnosis not present

## 2018-01-20 MED ORDER — CEPHALEXIN 500 MG PO CAPS: 500.0000 mg | ORAL_CAPSULE | Freq: Two times a day (BID) | ORAL | 0 refills | Status: DC

## 2018-01-20 MED ORDER — CHLORTHALIDONE 25 MG PO TABS: 25.0000 mg | ORAL_TABLET | Freq: Every day | ORAL | 0 refills | Status: DC

## 2018-01-20 NOTE — Patient Instructions (Signed)
Edema Edema is an abnormal buildup of fluids in your bodytissues. Edema is somewhatdependent on gravity to pull the fluid to the lowest place in your body. That makes the condition more common in the legs and thighs (lower extremities). Painless swelling of the feet and ankles is common and becomes more likely as you get older. It is also common in looser tissues, like around your eyes. When the affected area is squeezed, the fluid may move out of that spot and leave a dent for a few moments. This dent is called pitting. What are the causes? There are many possible causes of edema. Eating too much salt and being on your feet or sitting for a long time can cause edema in your legs and ankles. Hot weather may make edema worse. Common medical causes of edema include:  Heart failure.  Liver disease.  Kidney disease.  Weak blood vessels in your legs.  Cancer.  An injury.  Pregnancy.  Some medications.  Obesity.  What are the signs or symptoms? Edema is usually painless.Your skin may look swollen or shiny. How is this diagnosed? Your health care provider may be able to diagnose edema by asking about your medical history and doing a physical exam. You may need to have tests such as X-rays, an electrocardiogram, or blood tests to check for medical conditions that may cause edema. How is this treated? Edema treatment depends on the cause. If you have heart, liver, or kidney disease, you need the treatment appropriate for these conditions. General treatment may include:  Elevation of the affected body part above the level of your heart.  Compression of the affected body part. Pressure from elastic bandages or support stockings squeezes the tissues and forces fluid back into the blood vessels. This keeps fluid from entering the tissues.  Restriction of fluid and salt intake.  Use of a water pill (diuretic). These medications are appropriate only for some types of edema. They pull fluid  out of your body and make you urinate more often. This gets rid of fluid and reduces swelling, but diuretics can have side effects. Only use diuretics as directed by your health care provider.  Follow these instructions at home:  Keep the affected body part above the level of your heart when you are lying down.  Do not sit still or stand for prolonged periods.  Do not put anything directly under your knees when lying down.  Do not wear constricting clothing or garters on your upper legs.  Exercise your legs to work the fluid back into your blood vessels. This may help the swelling go down.  Wear elastic bandages or support stockings to reduce ankle swelling as directed by your health care provider.  Eat a low-salt diet to reduce fluid if your health care provider recommends it.  Only take medicines as directed by your health care provider. Contact a health care provider if:  Your edema is not responding to treatment.  You have heart, liver, or kidney disease and notice symptoms of edema.  You have edema in your legs that does not improve after elevating them.  You have sudden and unexplained weight gain. Get help right away if:  You develop shortness of breath or chest pain.  You cannot breathe when you lie down.  You develop pain, redness, or warmth in the swollen areas.  You have heart, liver, or kidney disease and suddenly get edema.  You have a fever and your symptoms suddenly get worse. This information is   not intended to replace advice given to you by your health care provider. Make sure you discuss any questions you have with your health care provider. Document Released: 06/25/2005 Document Revised: 12/01/2015 Document Reviewed: 04/17/2013 Elsevier Interactive Patient Education  2017 Elsevier Inc.  

## 2018-01-20 NOTE — Assessment & Plan Note (Signed)
Elevated ext  Compression socks Med per orders

## 2018-01-20 NOTE — Progress Notes (Signed)
Patient ID: Joan [name], female   DOB: 12/25/1945, 72 y.o.   MRN: [id number]    Subjective:  I acted as a Education administrator for Dr. Carollee Herter.  [name], Elfrida   Patient ID: Joan [name], female    DOB: 13-Jul-1945, 72 y.o.   MRN: [id number]  Chief Complaint  Patient presents with  . Leg Swelling    HPI  Patient is in today for swelling both legs and edema.  Patient states that she feels that the spironolactone has not helped.  No sob, no cp, no calf pain  Patient Care Team: Carollee Herter, Alferd Apa, DO as PCP - General (Family Medicine) Sueanne Margarita, MD as PCP - Cardiology (Cardiology) Nicholaus Bloom, MD as Consulting Physician (Anesthesiology) Kary Kos, MD as Consulting Physician (Neurosurgery) Iran Planas, MD as Consulting Physician (Orthopedic Surgery) Justice Britain, MD as Consulting Physician (Orthopedic Surgery) Jarome Matin, MD as Consulting Physician (Dermatology) Bobbye Charleston, MD as Consulting Physician (Obstetrics and Gynecology) Barbaraann Cao, OD as Referring Physician (Optometry) Gatha Mayer, MD as Consulting Physician (Gastroenterology) Gaynelle Arabian, MD as Consulting Physician (Orthopedic Surgery) Gaynelle Arabian, MD as Consulting Physician (Orthopedic Surgery)   Past Medical History:  Diagnosis Date  . Arthritis    "shoulders; wrist; probably spine" (06/24/2013)  . Chronic low back pain    followed by Dr Hardin Negus pain mgt  . Colon polyp   . Constipation   . Esophageal stricture   . Finger pain, left    2 fingers on left hand since wrist surgery  . GERD (gastroesophageal reflux disease)   . Heart murmur    "slight; not on RX" (06/24/2013)  . History of cardiac arrhythmia    cardiologist- Traci Turner  . Hx of colonic polyps 08/28/2004  . Hyperlipemia   . Hypertension   . Hypothyroidism   . Osteoarthritis of left knee    advanced  . PONV (postoperative nausea and vomiting)    Pt reports symptoms are the result of gallbladder and  cholecystectomy, not anesthesia  . PVC's (premature ventricular contractions)   . Sleep apnea 1990's   "tested; tried mask; couldn't stand it; told me as long as I slept on my side I'd be ok" (06/24/2013)  . Thyroid nodule   . Tobacco abuse     Past Surgical History:  Procedure Laterality Date  . ABDOMINAL HYSTERECTOMY  1988   "partial" (06/24/2013)  . ABDOMINAL HYSTERECTOMY     partial in 1988  . ANKLE SURGERY Left 1995   "tendon repair" (06/24/2013)  . BACK SURGERY     "think today was my 8th back OR" (06/24/2013)  . CERVICAL SPINE SURGERY  2012  . [place]  . CHOLECYSTECTOMY N/A 04/16/2013   Procedure: LAPAROSCOPIC CHOLECYSTECTOMY ;  Surgeon: Imogene Burn. Georgette Dover, MD;  Location: [facility];  Service: General;  Laterality: N/A;  . ELBOW SURGERY  1990's  . FOOT SURGERY Right 2012   SPUR REMOVED  . INFUSION PUMP IMPLANTATION  1990's   "implantablet morphine pump; took it out w/in 11 months  . KNEE ARTHROSCOPY Left 1991; ~ 1993  . LAPAROSCOPIC LYSIS OF ADHESIONS N/A 04/16/2013   Procedure: LAPAROSCOPIC LYSIS OF ADHESIONS;  Surgeon: Imogene Burn. Georgette Dover, MD;  Location: [facility];  Service: General;  Laterality: N/A;  . LUMBAR FUSION     and rods  . LUMBAR LAMINECTOMY/DECOMPRESSION MICRODISCECTOMY N/A 11/28/2012   Procedure: DECOMPRESSIVE LUMBAR LAMINECTOMY LEVEL 1;  Surgeon: Elaina Hoops, MD;  Location: [place] [facility];  Service: Neurosurgery;  Laterality: N/A;  DECOMPRESSIVE LUMBAR LAMINECTOMY LEVEL 1  . ORIF DISTAL RADIUS FRACTURE Left 12/30/2013   dr Caralyn Guile  . ORIF WRIST FRACTURE Left 12/30/2013   Procedure: OPEN REDUCTION INTERNAL FIXATION (ORIF) LEFT WRIST FRACTURE AND REPAIR AS INDICATED;  Surgeon: Linna Hoff, MD;  Location: [facility];  Service: Orthopedics;  Laterality: Left;  . POSTERIOR FUSION LUMBAR SPINE  06/24/2013  . ROBOTIC ASSISTED SALPINGO OOPHERECTOMY Bilateral 08/20/2014   Procedure: ROBOTIC ASSISTED SALPINGO OOPHORECTOMY;  Surgeon: Daria Pastures, MD;  Location: [facility];  Service: Gynecology;  Laterality: Bilateral;  . SHOULDER ARTHROSCOPY Left 09/2011  . THYROIDECTOMY  2012  . TOTAL KNEE ARTHROPLASTY Right 09/09/2017   Procedure: RIGHT TOTAL KNEE ARTHROPLASTY;  Surgeon: Gaynelle Arabian, MD;  Location: [facility];  Service: Orthopedics;  Laterality: Right;    Family History  Problem Relation Age of Onset  . Pancreatic cancer Father   . Cancer Father   . Diabetes type II Mother   . Hypertension Mother   . Coronary artery disease Mother 32  . Diabetes Mother   . Thyroid cancer Mother   . Skin cancer Mother   . Diabetes Sister   . Hypertension Sister   . Bone cancer Sister   . Breast cancer Sister   . Hypertension Brother   . Breast cancer Maternal Aunt   . Other Neg Hx        No family history of  colon cancer    Social History   Socioeconomic History  . Marital status: Divorced    Spouse name: Not on file  . Number of children: Not on file  . Years of education: Not on file  . Highest education level: Not on file  Occupational History  . Not on file  Social Needs  . Financial resource strain: Not on file  . Food insecurity:    Worry: Not on file    Inability: Not on file  . Transportation needs:    Medical: Not on file    Non-medical: Not on file  Tobacco Use  . Smoking status: Current Every Day Smoker    Packs/day: 1.00    Years: 35.00    Pack years: 35.00    Types: Cigarettes    Last attempt to quit: 11/22/2015    Years since quitting: 2.1  . Smokeless tobacco: Never Used  Substance and Sexual Activity  . Alcohol use: No    Alcohol/week: 0.0 oz  . Drug use: No  . Sexual activity: Not Currently  Lifestyle  . Physical activity:    Days per week: Not on file    Minutes per session: Not on file  . Stress: Not on file  Relationships  . Social connections:    Talks on phone: Not on file    Gets together: Not on file    Attends religious service: Not on file    Active member of club or organization: Not on file    Attends  meetings of clubs or organizations: Not on file    Relationship status: Not on file  . Intimate partner violence:    Fear of current or ex partner: Not on file    Emotionally abused: Not on file    Physically abused: Not on file    Forced sexual activity: Not on file  Other Topics Concern  . Not on file  Social History Narrative   Divorced   Current Smoker  1 ppd -  20 yrs      Alcohol  use-no       [employer] - laid off       Physician roster:   Dr. Elta Guadeloupe Philips - pain management   Dr. Philis Pique - GYN   Dr. Saintclair Halsted - Neurosurgery   Dr. Ronnald Ramp - dermatology   Dr. Caralyn Guile - orthopedics   Dr. Fransico Him - cardiology   Dr. Miller-ophthalmology    Outpatient Medications Prior to Visit  Medication Sig Dispense Refill  . bisacodyl (DULCOLAX) 5 MG EC tablet Take 5 mg by mouth at bedtime.  30 tablet   . carisoprodol (SOMA) 350 MG tablet Take 175 mg by mouth 3 (three) times daily. For muscle spasms    . diclofenac sodium (VOLTAREN) 1 % GEL Apply 4 g topically 4 (four) times daily.  2  . dorzolamide-timolol (COSOPT) 22.3-6.8 MG/ML ophthalmic solution Place 1 drop into the left eye 2 (two) times daily.    Marland Kitchen estradiol (ESTRACE) 0.5 MG tablet Take 0.5 mg by mouth daily.  4  . fenofibrate 160 MG tablet TAKE ONE TABLET BY MOUTH ONCE DAILY. 90 tablet 1  . HYDROcodone-acetaminophen (NORCO) 10-325 MG tablet Take 1 tablet by mouth every 4 (four) hours as needed.  0  . levothyroxine (SYNTHROID, LEVOTHROID) 88 MCG tablet TAKE 1 TABLET BY MOUTH  DAILY BEFORE BREAKFAST 90 tablet 1  . potassium chloride SA (K-DUR,KLOR-CON) 20 MEQ tablet TAKE 3 TABLETS BY MOUTH  DAILY 270 tablet 1  . simvastatin (ZOCOR) 20 MG tablet TAKE 1 TABLET BY MOUTH AT  BEDTIME 90 tablet 1  . traZODone (DESYREL) 50 MG tablet Take 1 tablet (50 mg total) by mouth at bedtime. 90 tablet 1  . spironolactone (ALDACTONE) 25 MG tablet Take 1 tablet (25 mg total) by mouth daily. (Patient taking differently: Take 50 mg by mouth  daily. ) 30 tablet 2  . doxycycline (VIBRA-TABS) 100 MG tablet Take 1 tablet (100 mg total) by mouth 2 (two) times daily. 20 tablet 0   No facility-administered medications prior to visit.     Allergies  Allergen Reactions  . Morphine And Related Swelling  . Amitriptyline Other (See Comments)    Leg swelling  . Gabapentin Itching    neurontin  . Triamterene Itching and Rash    Review of Systems  Constitutional: Negative for fever and malaise/fatigue.  HENT: Negative for congestion.   Eyes: Negative for blurred vision.  Respiratory: Negative for cough and shortness of breath.   Cardiovascular: Positive for leg swelling. Negative for chest pain and palpitations.  Gastrointestinal: Negative for vomiting.  Musculoskeletal: Negative for back pain.  Skin: Negative for rash.  Neurological: Negative for loss of consciousness and headaches.       Objective:    Physical Exam  Constitutional: She is oriented to person, place, and time. She appears well-developed and well-nourished.  HENT:  Head: Normocephalic and atraumatic.  Eyes: Conjunctivae and EOM are normal.  Neck: Normal range of motion. Neck supple. No JVD present. Carotid bruit is not present. No thyromegaly present.  Cardiovascular: Normal rate, regular rhythm and normal heart sounds.  No murmur heard. Pulmonary/Chest: Effort normal and breath sounds normal. No respiratory distress. She has no wheezes. She has no rales. She exhibits no tenderness.  Musculoskeletal: She exhibits edema.       Right lower leg: She exhibits edema. She exhibits no tenderness.       Left lower leg: She exhibits edema. She exhibits no tenderness.       Legs: Neurological: She is alert and  oriented to person, place, and time.  Psychiatric: She has a normal mood and affect.  Nursing note and vitals reviewed.   BP 116/70 (BP Location: Left Arm, Cuff Size: Large)   Pulse 65   Temp 98 F (36.7 C) (Oral)   Resp 16   Ht 5\' 1"  (1.549 m)   Wt  154 lb 6.4 oz (70 kg)   SpO2 95%   BMI 29.17 kg/m  Wt Readings from Last 3 Encounters:  01/20/18 154 lb 6.4 oz (70 kg)  12/30/17 148 lb (67.1 kg)  09/09/17 160 lb (72.6 kg)   BP Readings from Last 3 Encounters:  01/20/18 116/70  12/30/17 122/68  09/11/17 (!) 140/59     Immunization History  Administered Date(s) Administered  . Influenza Split 03/28/2012  . Influenza Whole 04/28/2007, 05/25/2008, 04/19/2009, 04/18/2010  . Influenza, High Dose Seasonal PF 04/13/2014, 04/04/2015, 04/30/2016, 03/14/2017  . Influenza,inj,Quad PF,6+ Mos 03/06/2013  . Pneumococcal Conjugate-13 06/07/2014  . Pneumococcal Polysaccharide-23 08/12/2007, 10/18/2015  . Td 10/23/2004, 09/08/2014  . Zoster 03/28/2012    Health Maintenance  Topic Date Due  . MAMMOGRAM  12/12/2017  . INFLUENZA VACCINE  02/06/2018  . COLONOSCOPY  03/27/2020  . TETANUS/TDAP  09/07/2024  . DEXA SCAN  Completed  . Hepatitis C Screening  Completed  . PNA vac Low Risk Adult  Completed    Lab Results  Component Value Date   WBC 7.8 09/11/2017   HGB 9.5 (L) 09/11/2017   HCT 27.9 (L) 09/11/2017   PLT 197 09/11/2017   GLUCOSE 98 12/30/2017   CHOL 151 12/30/2017   TRIG 103.0 12/30/2017   HDL 69.30 12/30/2017   LDLDIRECT 106.0 11/29/2015   LDLCALC 61 12/30/2017   ALT 7 12/30/2017   AST 14 12/30/2017   NA 139 12/30/2017   K 4.4 12/30/2017   CL 101 12/30/2017   CREATININE 1.06 12/30/2017   BUN 24 (H) 12/30/2017   CO2 31 12/30/2017   TSH 2.64 07/01/2017   INR 0.97 09/03/2017   HGBA1C 6.0 12/30/2017    Lab Results  Component Value Date   TSH 2.64 07/01/2017   Lab Results  Component Value Date   WBC 7.8 09/11/2017   HGB 9.5 (L) 09/11/2017   HCT 27.9 (L) 09/11/2017   MCV 92.4 09/11/2017   PLT 197 09/11/2017   Lab Results  Component Value Date   NA 139 12/30/2017   K 4.4 12/30/2017   CO2 31 12/30/2017   GLUCOSE 98 12/30/2017   BUN 24 (H) 12/30/2017   CREATININE 1.06 12/30/2017   BILITOT 0.5 12/30/2017    ALKPHOS 52 12/30/2017   AST 14 12/30/2017   ALT 7 12/30/2017   PROT 6.7 12/30/2017   ALBUMIN 4.4 12/30/2017   CALCIUM 9.4 12/30/2017   ANIONGAP 6 09/11/2017   GFR 53.99 (L) 12/30/2017   Lab Results  Component Value Date   CHOL 151 12/30/2017   Lab Results  Component Value Date   HDL 69.30 12/30/2017   Lab Results  Component Value Date   LDLCALC 61 12/30/2017   Lab Results  Component Value Date   TRIG 103.0 12/30/2017   Lab Results  Component Value Date   CHOLHDL 2 12/30/2017   Lab Results  Component Value Date   HGBA1C 6.0 12/30/2017         Assessment & Plan:   Problem List Items Addressed This Visit      Unprioritized   Edema    Elevated ext  Compression socks Med per orders  Other Visit Diagnoses    Lower extremity edema    -  Primary   Relevant Medications   chlorthalidone (HYGROTON) 25 MG tablet   Other Relevant Orders   Basic metabolic panel   DG Chest 2 View   Cellulitis of lower extremity, unspecified laterality       Relevant Medications   cephALEXin (KEFLEX) 500 MG capsule    elevate legs Compression stocks  I have discontinued Prerna M. Lococo's spironolactone and doxycycline. I am also having her start on chlorthalidone and cephALEXin. Additionally, I am having her maintain her carisoprodol, bisacodyl, dorzolamide-timolol, fenofibrate, potassium chloride SA, levothyroxine, traZODone, simvastatin, diclofenac sodium, estradiol, and HYDROcodone-acetaminophen.  Meds ordered this encounter  Medications  . chlorthalidone (HYGROTON) 25 MG tablet    Sig: Take 1 tablet (25 mg total) by mouth daily.    Dispense:  30 tablet    Refill:  0  . cephALEXin (KEFLEX) 500 MG capsule    Sig: Take 1 capsule (500 mg total) by mouth 2 (two) times daily.    Dispense:  20 capsule    Refill:  0    CMA served as scribe during this visit. History, Physical and Plan performed by medical provider. Documentation and orders reviewed and attested to.    [name], DO

## 2018-01-21 LAB — BASIC METABOLIC PANEL
BUN: 17 mg/dL (ref 6–23)
CO2: 28 mEq/L (ref 19–32)
Calcium: 8.8 mg/dL (ref 8.4–10.5)
Chloride: 105 mEq/L (ref 96–112)
Creatinine, Ser: 0.83 mg/dL (ref 0.40–1.20)
GFR: 71.58 mL/min (ref >60.00)
Glucose, Bld: 94 mg/dL (ref 70–99)
Potassium: 4.7 mEq/L (ref 3.5–5.1)
Sodium: 138 mEq/L (ref 135–145)

## 2018-01-23 ENCOUNTER — Telehealth: Payer: Self-pay | Admitting: *Deleted

## 2018-01-23 ENCOUNTER — Ambulatory Visit (HOSPITAL_COMMUNITY): Payer: Medicare Other

## 2018-01-23 NOTE — Telephone Encounter (Signed)
-----   Message from [name], [place] sent at 01/22/2018 12:29 PM EDT ----- I said she could stop potassium in lab note but I forgot she is off spironolactone too and I put her on chlorthiadone so she should take 1 K supp daily

## 2018-02-18 ENCOUNTER — Telehealth: Payer: Self-pay | Admitting: Family Medicine

## 2018-02-18 DIAGNOSIS — R600 Localized edema: Secondary | ICD-10-CM

## 2018-02-18 MED ORDER — CHLORTHALIDONE 25 MG PO TABS: 25.0000 mg | ORAL_TABLET | Freq: Every day | ORAL | 0 refills | Status: DC

## 2018-02-18 NOTE — Telephone Encounter (Signed)
Copied from [place] [id number]. Topic: Quick Communication - Rx Refill/Question >> Feb 18, 2018  9:32 AM Selinda Flavin B, NT wrote: **Patient is out of town and realized her medication had 0 refills and she only has 1 pill left. Would like a refill sent to [facility], [place] and she will make an appointment if needed when she returns home**  Medication: chlorthalidone (HYGROTON) 25 MG tablet   Has the patient contacted their pharmacy? Yes.   (Agent: If no, request that the patient contact the pharmacy for the refill.) (Agent: If yes, when and what did the pharmacy advise?)  Preferred Pharmacy (with phone number or street name): [facility], [place]: Please be advised that RX refills may take up to 3 business days. We ask that you follow-up with your pharmacy.

## 2018-02-18 NOTE — Telephone Encounter (Signed)
Chlorthalidone 25 mg tab refill Last Refill:01/20/18 # 30 Last OV: 01/20/18 PCP: Lawson Radar, Y. Pharmacy:[id number]  [place], [place]  Refilled per protocol.

## 2018-02-20 ENCOUNTER — Ambulatory Visit: Payer: Medicare Other | Admitting: Family Medicine

## 2018-02-25 ENCOUNTER — Encounter: Payer: Self-pay | Admitting: Family Medicine

## 2018-02-25 ENCOUNTER — Ambulatory Visit (INDEPENDENT_AMBULATORY_CARE_PROVIDER_SITE_OTHER): Payer: Medicare Other | Admitting: Family Medicine

## 2018-02-25 VITALS — BP 113/51 | HR 67 | Temp 98.3°F | Resp 16 | Ht 61.0 in | Wt 155.8 lb

## 2018-02-25 DIAGNOSIS — R600 Localized edema: Secondary | ICD-10-CM

## 2018-02-25 LAB — COMPREHENSIVE METABOLIC PANEL
ALT: 8 U/L (ref 0–35)
AST: 14 U/L (ref 0–37)
Albumin: 4.3 g/dL (ref 3.5–5.2)
Alkaline Phosphatase: 48 U/L (ref 39–117)
BUN: 18 mg/dL (ref 6–23)
CO2: 30 mEq/L (ref 19–32)
Calcium: 9.1 mg/dL (ref 8.4–10.5)
Chloride: 102 mEq/L (ref 96–112)
Creatinine, Ser: 1 mg/dL (ref 0.40–1.20)
GFR: 57.72 mL/min — ABNORMAL LOW (ref >60.00)
Glucose, Bld: 101 mg/dL — ABNORMAL HIGH (ref 70–99)
Potassium: 3.8 mEq/L (ref 3.5–5.1)
Sodium: 138 mEq/L (ref 135–145)
Total Bilirubin: 0.6 mg/dL (ref 0.2–1.2)
Total Protein: 6.6 g/dL (ref 6.0–8.3)

## 2018-02-25 MED ORDER — CHLORTHALIDONE 50 MG PO TABS: 50.0000 mg | ORAL_TABLET | Freq: Every day | ORAL | 1 refills | Status: DC

## 2018-02-25 NOTE — Progress Notes (Deleted)
Patient ID: Joan [name], female   DOB: Jul 03, 1946, 72 y.o.   MRN: [id number]     Subjective:  I acted as a Education administrator for Dr. Carollee Herter.  Joan [name], Joan [name]   Patient ID: Joan [name], female    DOB: 1946/05/26, 72 y.o.   MRN: [id number]  Chief Complaint  Patient presents with  . Edema    lower extremity follow up    HPI Patient is in today for follow up lower extremity edema.  Past Medical History:  Diagnosis Date  . Arthritis    "shoulders; wrist; probably spine" (06/24/2013)  . Chronic low back pain    followed by Dr Hardin Negus pain mgt  . Colon polyp   . Constipation   . Esophageal stricture   . Finger pain, left    2 fingers on left hand since wrist surgery  . GERD (gastroesophageal reflux disease)   . Heart murmur    "slight; not on RX" (06/24/2013)  . History of cardiac arrhythmia    cardiologist- Traci Turner  . Hx of colonic polyps 08/28/2004  . Hyperlipemia   . Hypertension   . Hypothyroidism   . Osteoarthritis of left knee    advanced  . PONV (postoperative nausea and vomiting)    Pt reports symptoms are the result of gallbladder and cholecystectomy, not anesthesia  . PVC's (premature ventricular contractions)   . Sleep apnea 1990's   "tested; tried mask; couldn't stand it; told me as long as I slept on my side I'd be ok" (06/24/2013)  . Thyroid nodule   . Tobacco abuse     Past Surgical History:  Procedure Laterality Date  . ABDOMINAL HYSTERECTOMY  1988   "partial" (06/24/2013)  . ABDOMINAL HYSTERECTOMY     partial in 1988  . ANKLE SURGERY Left 1995   "tendon repair" (06/24/2013)  . BACK SURGERY     "think today was my 8th back OR" (06/24/2013)  . CERVICAL SPINE SURGERY  2012  . [place]  . CHOLECYSTECTOMY N/A 04/16/2013   Procedure: LAPAROSCOPIC CHOLECYSTECTOMY ;  Surgeon: Imogene Burn. Georgette Dover, MD;  Location: [facility];  Service: General;  Laterality: N/A;  . ELBOW SURGERY  1990's  . FOOT SURGERY Right 2012   SPUR REMOVED  . INFUSION  PUMP IMPLANTATION  1990's   "implantablet morphine pump; took it out w/in 11 months  . KNEE ARTHROSCOPY Left 1991; ~ 1993  . LAPAROSCOPIC LYSIS OF ADHESIONS N/A 04/16/2013   Procedure: LAPAROSCOPIC LYSIS OF ADHESIONS;  Surgeon: Imogene Burn. Georgette Dover, MD;  Location: [facility];  Service: General;  Laterality: N/A;  . LUMBAR FUSION     and rods  . LUMBAR LAMINECTOMY/DECOMPRESSION MICRODISCECTOMY N/A 11/28/2012   Procedure: DECOMPRESSIVE LUMBAR LAMINECTOMY LEVEL 1;  Surgeon: Elaina Hoops, MD;  Location: [facility];  Service: Neurosurgery;  Laterality: N/A;  DECOMPRESSIVE LUMBAR LAMINECTOMY LEVEL 1  . ORIF DISTAL RADIUS FRACTURE Left 12/30/2013   dr Caralyn Guile  . ORIF WRIST FRACTURE Left 12/30/2013   Procedure: OPEN REDUCTION INTERNAL FIXATION (ORIF) LEFT WRIST FRACTURE AND REPAIR AS INDICATED;  Surgeon: Linna Hoff, MD;  Location: [facility];  Service: Orthopedics;  Laterality: Left;  . POSTERIOR FUSION LUMBAR SPINE  06/24/2013  . ROBOTIC ASSISTED SALPINGO OOPHERECTOMY Bilateral 08/20/2014   Procedure: ROBOTIC ASSISTED SALPINGO OOPHORECTOMY;  Surgeon: Daria Pastures, MD;  Location: [facility];  Service: Gynecology;  Laterality: Bilateral;  . SHOULDER ARTHROSCOPY Left 09/2011  . THYROIDECTOMY  2012  . TOTAL KNEE ARTHROPLASTY Right 09/09/2017  Procedure: RIGHT TOTAL KNEE ARTHROPLASTY;  Surgeon: Gaynelle Arabian, MD;  Location: [facility];  Service: Orthopedics;  Laterality: Right;    Family History  Problem Relation Age of Onset  . Pancreatic cancer Father   . Cancer Father   . Diabetes type II Mother   . Hypertension Mother   . Coronary artery disease Mother 70  . Diabetes Mother   . Thyroid cancer Mother   . Skin cancer Mother   . Diabetes Sister   . Hypertension Sister   . Bone cancer Sister   . Breast cancer Sister   . Hypertension Brother   . Breast cancer Maternal Aunt   . Other Neg Hx        No family history of  colon cancer    Social History   Socioeconomic History  . Marital status: Divorced      Spouse name: Not on file  . Number of children: Not on file  . Years of education: Not on file  . Highest education level: Not on file  Occupational History  . Not on file  Social Needs  . Financial resource strain: Not on file  . Food insecurity:    Worry: Not on file    Inability: Not on file  . Transportation needs:    Medical: Not on file    Non-medical: Not on file  Tobacco Use  . Smoking status: Current Every Day Smoker    Packs/day: 1.00    Years: 35.00    Pack years: 35.00    Types: Cigarettes    Last attempt to quit: 11/22/2015    Years since quitting: 2.2  . Smokeless tobacco: Never Used  Substance and Sexual Activity  . Alcohol use: No    Alcohol/week: 0.0 standard drinks  . Drug use: No  . Sexual activity: Not Currently  Lifestyle  . Physical activity:    Days per week: Not on file    Minutes per session: Not on file  . Stress: Not on file  Relationships  . Social connections:    Talks on phone: Not on file    Gets together: Not on file    Attends religious service: Not on file    Active member of club or organization: Not on file    Attends meetings of clubs or organizations: Not on file    Relationship status: Not on file  . Intimate partner violence:    Fear of current or ex partner: Not on file    Emotionally abused: Not on file    Physically abused: Not on file    Forced sexual activity: Not on file  Other Topics Concern  . Not on file  Social History Narrative   Divorced   Current Smoker  1 ppd -  20 yrs      Alcohol use-no       [employer] - laid off       Physician roster:   Dr. Elta Guadeloupe Philips - pain management   Dr. Philis Pique - GYN   Dr. Saintclair Halsted - Neurosurgery   Dr. Ronnald Ramp - dermatology   Dr. Caralyn Guile - orthopedics   Dr. Fransico Him - cardiology   Dr. Miller-ophthalmology    Outpatient Medications Prior to Visit  Medication Sig Dispense Refill  . bisacodyl (DULCOLAX) 5 MG EC tablet Take 5 mg by mouth at bedtime.  30  tablet   . carisoprodol (SOMA) 350 MG tablet Take 175 mg by mouth 3 (three) times daily. For muscle spasms    .  cephALEXin (KEFLEX) 500 MG capsule Take 1 capsule (500 mg total) by mouth 2 (two) times daily. 20 capsule 0  . chlorthalidone (HYGROTON) 25 MG tablet Take 1 tablet (25 mg total) by mouth daily. 30 tablet 0  . diclofenac sodium (VOLTAREN) 1 % GEL Apply 4 g topically 4 (four) times daily.  2  . dorzolamide-timolol (COSOPT) 22.3-6.8 MG/ML ophthalmic solution Place 1 drop into the left eye 2 (two) times daily.    Marland Kitchen estradiol (ESTRACE) 0.5 MG tablet Take 0.5 mg by mouth daily.  4  . fenofibrate 160 MG tablet TAKE ONE TABLET BY MOUTH ONCE DAILY. 90 tablet 1  . HYDROcodone-acetaminophen (NORCO) 10-325 MG tablet Take 1 tablet by mouth every 4 (four) hours as needed.  0  . levothyroxine (SYNTHROID, LEVOTHROID) 88 MCG tablet TAKE 1 TABLET BY MOUTH  DAILY BEFORE BREAKFAST 90 tablet 1  . simvastatin (ZOCOR) 20 MG tablet TAKE 1 TABLET BY MOUTH AT  BEDTIME 90 tablet 1  . traZODone (DESYREL) 50 MG tablet Take 1 tablet (50 mg total) by mouth at bedtime. 90 tablet 1  . potassium chloride SA (K-DUR,KLOR-CON) 20 MEQ tablet TAKE 3 TABLETS BY MOUTH  DAILY (Patient not taking: Reported on 02/25/2018) 270 tablet 1   No facility-administered medications prior to visit.     Allergies  Allergen Reactions  . Morphine And Related Swelling  . Amitriptyline Other (See Comments)    Leg swelling  . Gabapentin Itching    neurontin  . Triamterene Itching and Rash    Review of Systems  Constitutional: Negative for fever and malaise/fatigue.  HENT: Negative for congestion.   Eyes: Negative for blurred vision.  Respiratory: Negative for cough and shortness of breath.   Cardiovascular: Positive for leg swelling. Negative for chest pain and palpitations.  Gastrointestinal: Negative for vomiting.  Musculoskeletal: Negative for back pain.  Skin: Negative for rash.  Neurological: Negative for loss of  consciousness and headaches.       Objective:    Physical Exam  BP (!) 113/51 (BP Location: Left Arm, Cuff Size: Normal)   Pulse 67   Temp 98.3 F (36.8 C) (Oral)   Resp 16   Ht 5\' 1"  (1.549 m)   Wt 155 lb 12.8 oz (70.7 kg)   SpO2 98%   BMI 29.44 kg/m  Wt Readings from Last 3 Encounters:  02/25/18 155 lb 12.8 oz (70.7 kg)  01/20/18 154 lb 6.4 oz (70 kg)  12/30/17 148 lb (67.1 kg)     Lab Results  Component Value Date   WBC 7.8 09/11/2017   HGB 9.5 (L) 09/11/2017   HCT 27.9 (L) 09/11/2017   PLT 197 09/11/2017   GLUCOSE 94 01/20/2018   CHOL 151 12/30/2017   TRIG 103.0 12/30/2017   HDL 69.30 12/30/2017   LDLDIRECT 106.0 11/29/2015   LDLCALC 61 12/30/2017   ALT 7 12/30/2017   AST 14 12/30/2017   NA 138 01/20/2018   K 4.7 01/20/2018   CL 105 01/20/2018   CREATININE 0.83 01/20/2018   BUN 17 01/20/2018   CO2 28 01/20/2018   TSH 2.64 07/01/2017   INR 0.97 09/03/2017   HGBA1C 6.0 12/30/2017    Lab Results  Component Value Date   TSH 2.64 07/01/2017   Lab Results  Component Value Date   WBC 7.8 09/11/2017   HGB 9.5 (L) 09/11/2017   HCT 27.9 (L) 09/11/2017   MCV 92.4 09/11/2017   PLT 197 09/11/2017   Lab Results  Component Value Date  NA 138 01/20/2018   K 4.7 01/20/2018   CO2 28 01/20/2018   GLUCOSE 94 01/20/2018   BUN 17 01/20/2018   CREATININE 0.83 01/20/2018   BILITOT 0.5 12/30/2017   ALKPHOS 52 12/30/2017   AST 14 12/30/2017   ALT 7 12/30/2017   PROT 6.7 12/30/2017   ALBUMIN 4.4 12/30/2017   CALCIUM 8.8 01/20/2018   ANIONGAP 6 09/11/2017   GFR 71.58 01/20/2018   Lab Results  Component Value Date   CHOL 151 12/30/2017   Lab Results  Component Value Date   HDL 69.30 12/30/2017   Lab Results  Component Value Date   LDLCALC 61 12/30/2017   Lab Results  Component Value Date   TRIG 103.0 12/30/2017   Lab Results  Component Value Date   CHOLHDL 2 12/30/2017   Lab Results  Component Value Date   HGBA1C 6.0 12/30/2017         Assessment & Plan:   Problem List Items Addressed This Visit    None      I am having Candise M. Amico maintain her carisoprodol, bisacodyl, dorzolamide-timolol, fenofibrate, potassium chloride SA, levothyroxine, traZODone, simvastatin, diclofenac sodium, estradiol, HYDROcodone-acetaminophen, cephALEXin, and chlorthalidone.  No orders of the defined types were placed in this encounter.   {PROVIDER TO DELETE} Jerene Dilling, CMA

## 2018-02-25 NOTE — Patient Instructions (Signed)
Edema Edema is an abnormal buildup of fluids in your bodytissues. Edema is somewhatdependent on gravity to pull the fluid to the lowest place in your body. That makes the condition more common in the legs and thighs (lower extremities). Painless swelling of the feet and ankles is common and becomes more likely as you get older. It is also common in looser tissues, like around your eyes. When the affected area is squeezed, the fluid may move out of that spot and leave a dent for a few moments. This dent is called pitting. What are the causes? There are many possible causes of edema. Eating too much salt and being on your feet or sitting for a long time can cause edema in your legs and ankles. Hot weather may make edema worse. Common medical causes of edema include:  Heart failure.  Liver disease.  Kidney disease.  Weak blood vessels in your legs.  Cancer.  An injury.  Pregnancy.  Some medications.  Obesity.  What are the signs or symptoms? Edema is usually painless.Your skin may look swollen or shiny. How is this diagnosed? Your health care provider may be able to diagnose edema by asking about your medical history and doing a physical exam. You may need to have tests such as X-rays, an electrocardiogram, or blood tests to check for medical conditions that may cause edema. How is this treated? Edema treatment depends on the cause. If you have heart, liver, or kidney disease, you need the treatment appropriate for these conditions. General treatment may include:  Elevation of the affected body part above the level of your heart.  Compression of the affected body part. Pressure from elastic bandages or support stockings squeezes the tissues and forces fluid back into the blood vessels. This keeps fluid from entering the tissues.  Restriction of fluid and salt intake.  Use of a water pill (diuretic). These medications are appropriate only for some types of edema. They pull fluid  out of your body and make you urinate more often. This gets rid of fluid and reduces swelling, but diuretics can have side effects. Only use diuretics as directed by your health care provider.  Follow these instructions at home:  Keep the affected body part above the level of your heart when you are lying down.  Do not sit still or stand for prolonged periods.  Do not put anything directly under your knees when lying down.  Do not wear constricting clothing or garters on your upper legs.  Exercise your legs to work the fluid back into your blood vessels. This may help the swelling go down.  Wear elastic bandages or support stockings to reduce ankle swelling as directed by your health care provider.  Eat a low-salt diet to reduce fluid if your health care provider recommends it.  Only take medicines as directed by your health care provider. Contact a health care provider if:  Your edema is not responding to treatment.  You have heart, liver, or kidney disease and notice symptoms of edema.  You have edema in your legs that does not improve after elevating them.  You have sudden and unexplained weight gain. Get help right away if:  You develop shortness of breath or chest pain.  You cannot breathe when you lie down.  You develop pain, redness, or warmth in the swollen areas.  You have heart, liver, or kidney disease and suddenly get edema.  You have a fever and your symptoms suddenly get worse. This information is   not intended to replace advice given to you by your health care provider. Make sure you discuss any questions you have with your health care provider. Document Released: 06/25/2005 Document Revised: 12/01/2015 Document Reviewed: 04/17/2013 Elsevier Interactive Patient Education  2017 Elsevier Inc.  

## 2018-02-25 NOTE — Progress Notes (Signed)
Patient ID: Joan [name], female    DOB: 1946-01-30  Age: 72 y.o. MRN: [id number]    Subjective:  Subjective  HPI Joan [name] presents for f/u low ext edema.  No sob, no cp.  Swelling has improved slightly.    Review of Systems  Constitutional: Negative for chills and fever.  HENT: Negative for congestion and hearing loss.   Eyes: Negative for discharge.  Respiratory: Negative for cough and shortness of breath.   Cardiovascular: Positive for leg swelling. Negative for chest pain and palpitations.  Gastrointestinal: Negative for abdominal pain, blood in stool, constipation, diarrhea, nausea and vomiting.  Genitourinary: Negative for dysuria, frequency, hematuria and urgency.  Musculoskeletal: Positive for arthralgias and gait problem. Negative for back pain and myalgias.  Skin: Negative for rash.  Allergic/Immunologic: Negative for environmental allergies.  Neurological: Negative for dizziness, weakness and headaches.  Hematological: Does not bruise/bleed easily.  Psychiatric/Behavioral: Negative for suicidal ideas. The patient is not nervous/anxious.     History Past Medical History:  Diagnosis Date  . Arthritis    "shoulders; wrist; probably spine" (06/24/2013)  . Chronic low back pain    followed by Dr Hardin Negus pain mgt  . Colon polyp   . Constipation   . Esophageal stricture   . Finger pain, left    2 fingers on left hand since wrist surgery  . GERD (gastroesophageal reflux disease)   . Heart murmur    "slight; not on RX" (06/24/2013)  . History of cardiac arrhythmia    cardiologist- Traci Turner  . Hx of colonic polyps 08/28/2004  . Hyperlipemia   . Hypertension   . Hypothyroidism   . Osteoarthritis of left knee    advanced  . PONV (postoperative nausea and vomiting)    Pt reports symptoms are the result of gallbladder and cholecystectomy, not anesthesia  . PVC's (premature ventricular contractions)   . Sleep apnea 1990's   "tested; tried mask; couldn't  stand it; told me as long as I slept on my side I'd be ok" (06/24/2013)  . Thyroid nodule   . Tobacco abuse     She has a past surgical history that includes Lumbar fusion; Cesarean section (1972); Ankle surgery (Left, 1995); Infusion pump implantation (1990's); Knee arthroscopy (Left, 1991; ~ 1993); Elbow surgery (1990's); Thyroidectomy (2012); Cervical spine surgery (2012); Foot surgery (Right, 2012); Shoulder arthroscopy (Left, 09/2011); Lumbar laminectomy/decompression microdiscectomy (N/A, 11/28/2012); Back surgery; Cholecystectomy (N/A, 04/16/2013); Laparoscopic lysis of adhesions (N/A, 04/16/2013); Posterior fusion lumbar spine (06/24/2013); Abdominal hysterectomy (1988); ORIF distal radius fracture (Left, 12/30/2013); ORIF wrist fracture (Left, 12/30/2013); Robotic assisted salpingo oophorectomy (Bilateral, 08/20/2014); Abdominal hysterectomy; and Total knee arthroplasty (Right, 09/09/2017).   Her family history includes Bone cancer in her sister; Breast cancer in her maternal aunt and sister; Cancer in her father; Coronary artery disease (age of onset: 76) in her mother; Diabetes in her mother and sister; Diabetes type II in her mother; Hypertension in her brother, mother, and sister; Pancreatic cancer in her father; Skin cancer in her mother; Thyroid cancer in her mother.She reports that she has been smoking cigarettes. She has a 35.00 pack-year smoking history. She has never used smokeless tobacco. She reports that she does not drink alcohol or use drugs.  Current Outpatient Medications on File Prior to Visit  Medication Sig Dispense Refill  . bisacodyl (DULCOLAX) 5 MG EC tablet Take 5 mg by mouth at bedtime.  30 tablet   . carisoprodol (SOMA) 350 MG tablet Take 175 mg by mouth 3 (  three) times daily. For muscle spasms    . cephALEXin (KEFLEX) 500 MG capsule Take 1 capsule (500 mg total) by mouth 2 (two) times daily. 20 capsule 0  . diclofenac sodium (VOLTAREN) 1 % GEL Apply 4 g topically 4 (four)  times daily.  2  . dorzolamide-timolol (COSOPT) 22.3-6.8 MG/ML ophthalmic solution Place 1 drop into the left eye 2 (two) times daily.    Marland Kitchen estradiol (ESTRACE) 0.5 MG tablet Take 0.5 mg by mouth daily.  4  . fenofibrate 160 MG tablet TAKE ONE TABLET BY MOUTH ONCE DAILY. 90 tablet 1  . HYDROcodone-acetaminophen (NORCO) 10-325 MG tablet Take 1 tablet by mouth every 4 (four) hours as needed.  0  . levothyroxine (SYNTHROID, LEVOTHROID) 88 MCG tablet TAKE 1 TABLET BY MOUTH  DAILY BEFORE BREAKFAST 90 tablet 1  . simvastatin (ZOCOR) 20 MG tablet TAKE 1 TABLET BY MOUTH AT  BEDTIME 90 tablet 1  . traZODone (DESYREL) 50 MG tablet Take 1 tablet (50 mg total) by mouth at bedtime. 90 tablet 1  . potassium chloride SA (K-DUR,KLOR-CON) 20 MEQ tablet TAKE 3 TABLETS BY MOUTH  DAILY (Patient not taking: Reported on 02/25/2018) 270 tablet 1   No current facility-administered medications on file prior to visit.      Objective:  Objective  Physical Exam  Constitutional: She is oriented to person, place, and time. She appears well-developed and well-nourished.  HENT:  Head: Normocephalic and atraumatic.  Eyes: Conjunctivae and EOM are normal.  Neck: Normal range of motion. Neck supple. No JVD present. Carotid bruit is not present. No thyromegaly present.  Cardiovascular: Normal rate, regular rhythm and normal heart sounds.  No murmur heard. Pulmonary/Chest: Effort normal and breath sounds normal. No respiratory distress. She has no wheezes. She has no rales. She exhibits no tenderness.  Musculoskeletal: She exhibits tenderness. She exhibits no edema.       Left hip: She exhibits decreased range of motion, decreased strength and tenderness.  Neurological: She is alert and oriented to person, place, and time.  Psychiatric: She has a normal mood and affect.  Nursing note and vitals reviewed.  BP (!) 113/51 (BP Location: Left Arm, Cuff Size: Normal)   Pulse 67   Temp 98.3 F (36.8 C) (Oral)   Resp 16   Ht 5'  1" (1.549 m)   Wt 155 lb 12.8 oz (70.7 kg)   SpO2 98%   BMI 29.44 kg/m  Wt Readings from Last 3 Encounters:  02/25/18 155 lb 12.8 oz (70.7 kg)  01/20/18 154 lb 6.4 oz (70 kg)  12/30/17 148 lb (67.1 kg)     Lab Results  Component Value Date   WBC 7.8 09/11/2017   HGB 9.5 (L) 09/11/2017   HCT 27.9 (L) 09/11/2017   PLT 197 09/11/2017   GLUCOSE 94 01/20/2018   CHOL 151 12/30/2017   TRIG 103.0 12/30/2017   HDL 69.30 12/30/2017   LDLDIRECT 106.0 11/29/2015   LDLCALC 61 12/30/2017   ALT 7 12/30/2017   AST 14 12/30/2017   NA 138 01/20/2018   K 4.7 01/20/2018   CL 105 01/20/2018   CREATININE 0.83 01/20/2018   BUN 17 01/20/2018   CO2 28 01/20/2018   TSH 2.64 07/01/2017   INR 0.97 09/03/2017   HGBA1C 6.0 12/30/2017    Dg Chest 2 View  Result Date: 01/20/2018 CLINICAL DATA:  BILATERAL lower extremity edema/swelling for 3 weeks after changing medication, history of essential hypertension, GERD, smoker EXAM: CHEST - 2 VIEW COMPARISON:  08/29/2015 FINDINGS: Normal heart size, mediastinal contours, and pulmonary vascularity. Lungs clear. No acute infiltrate, pleural effusion or pneumothorax. Bones demineralized. Prior thoracolumbar spinal fixation and cervicothoracic fusion. IMPRESSION: No acute abnormalities. Electronically Signed   By: Lavonia Dana M.D.   On: 01/20/2018 16:09     Assessment & Plan:  Plan  I have discontinued Joan [name]. Joan [name]'s chlorthalidone. I am also having her start on chlorthalidone. Additionally, I am having her maintain her carisoprodol, bisacodyl, dorzolamide-timolol, fenofibrate, potassium chloride SA, levothyroxine, traZODone, simvastatin, diclofenac sodium, estradiol, HYDROcodone-acetaminophen, and cephALEXin.  Meds ordered this encounter  Medications  . chlorthalidone (HYGROTON) 50 MG tablet    Sig: Take 1 tablet (50 mg total) by mouth daily.    Dispense:  30 tablet    Refill:  1    Problem List Items Addressed This Visit    None    Visit  Diagnoses    Lower extremity edema    -  Primary   Relevant Medications   chlorthalidone (HYGROTON) 50 MG tablet   Other Relevant Orders   Comprehensive metabolic panel   [place] Lower Ext Art Bilat    [employer] chlorthalidone Compression    Follow-up: Return in about 3 months (around 05/28/2018).  [name], DO

## 2018-02-26 DIAGNOSIS — M1612 Unilateral primary osteoarthritis, left hip: Secondary | ICD-10-CM | POA: Diagnosis not present

## 2018-02-27 ENCOUNTER — Ambulatory Visit (HOSPITAL_COMMUNITY): Payer: Medicare Other

## 2018-03-03 DIAGNOSIS — G894 Chronic pain syndrome: Secondary | ICD-10-CM | POA: Diagnosis not present

## 2018-03-03 DIAGNOSIS — M961 Postlaminectomy syndrome, not elsewhere classified: Secondary | ICD-10-CM | POA: Diagnosis not present

## 2018-03-03 DIAGNOSIS — M62830 Muscle spasm of back: Secondary | ICD-10-CM | POA: Diagnosis not present

## 2018-03-03 DIAGNOSIS — Z79891 Long term (current) use of opiate analgesic: Secondary | ICD-10-CM | POA: Diagnosis not present

## 2018-03-13 ENCOUNTER — Telehealth: Payer: Self-pay | Admitting: Family Medicine

## 2018-03-13 DIAGNOSIS — E785 Hyperlipidemia, unspecified: Secondary | ICD-10-CM

## 2018-03-13 MED ORDER — FENOFIBRATE 160 MG PO TABS: 160.0000 mg | ORAL_TABLET | Freq: Every day | ORAL | 1 refills | Status: DC

## 2018-03-13 NOTE — Telephone Encounter (Signed)
Copied from [place] [id number]. Topic: Quick Communication - Rx Refill/Question >> Mar 13, 2018  1:19 PM Gardiner Ramus wrote: Medication:fenofibrate 160 MG tablet [[id number]]   Has the patient contacted their pharmacy? No Preferred Pharmacy (with phone number or street name):[place], [place] - [address] [place] [address] [phone] (Phone) [phone] (Fax)    Agent: Please be advised that RX refills may take up to 3 business days. We ask that you follow-up with your pharmacy.

## 2018-03-14 DIAGNOSIS — M25561 Pain in right knee: Secondary | ICD-10-CM | POA: Diagnosis not present

## 2018-03-27 ENCOUNTER — Other Ambulatory Visit: Payer: Self-pay | Admitting: Obstetrics and Gynecology

## 2018-03-27 DIAGNOSIS — Z1231 Encounter for screening mammogram for malignant neoplasm of breast: Secondary | ICD-10-CM

## 2018-03-28 DIAGNOSIS — M5416 Radiculopathy, lumbar region: Secondary | ICD-10-CM | POA: Diagnosis not present

## 2018-04-07 DIAGNOSIS — M62830 Muscle spasm of back: Secondary | ICD-10-CM | POA: Diagnosis not present

## 2018-04-07 DIAGNOSIS — Z79891 Long term (current) use of opiate analgesic: Secondary | ICD-10-CM | POA: Diagnosis not present

## 2018-04-07 DIAGNOSIS — G5702 Lesion of sciatic nerve, left lower limb: Secondary | ICD-10-CM | POA: Diagnosis not present

## 2018-04-07 DIAGNOSIS — G894 Chronic pain syndrome: Secondary | ICD-10-CM | POA: Diagnosis not present

## 2018-04-07 DIAGNOSIS — M961 Postlaminectomy syndrome, not elsewhere classified: Secondary | ICD-10-CM | POA: Diagnosis not present

## 2018-04-08 ENCOUNTER — Other Ambulatory Visit: Payer: Self-pay | Admitting: Family Medicine

## 2018-04-14 DIAGNOSIS — M47818 Spondylosis without myelopathy or radiculopathy, sacral and sacrococcygeal region: Secondary | ICD-10-CM | POA: Diagnosis not present

## 2018-04-14 DIAGNOSIS — M961 Postlaminectomy syndrome, not elsewhere classified: Secondary | ICD-10-CM | POA: Diagnosis not present

## 2018-04-14 DIAGNOSIS — M25552 Pain in left hip: Secondary | ICD-10-CM | POA: Diagnosis not present

## 2018-04-14 DIAGNOSIS — M62830 Muscle spasm of back: Secondary | ICD-10-CM | POA: Diagnosis not present

## 2018-04-14 DIAGNOSIS — G5702 Lesion of sciatic nerve, left lower limb: Secondary | ICD-10-CM | POA: Diagnosis not present

## 2018-04-21 DIAGNOSIS — R262 Difficulty in walking, not elsewhere classified: Secondary | ICD-10-CM | POA: Diagnosis not present

## 2018-04-21 DIAGNOSIS — M6281 Muscle weakness (generalized): Secondary | ICD-10-CM | POA: Diagnosis not present

## 2018-04-21 DIAGNOSIS — G5702 Lesion of sciatic nerve, left lower limb: Secondary | ICD-10-CM | POA: Diagnosis not present

## 2018-04-21 DIAGNOSIS — M25552 Pain in left hip: Secondary | ICD-10-CM | POA: Diagnosis not present

## 2018-04-23 DIAGNOSIS — R262 Difficulty in walking, not elsewhere classified: Secondary | ICD-10-CM | POA: Diagnosis not present

## 2018-04-23 DIAGNOSIS — G5702 Lesion of sciatic nerve, left lower limb: Secondary | ICD-10-CM | POA: Diagnosis not present

## 2018-04-23 DIAGNOSIS — M25552 Pain in left hip: Secondary | ICD-10-CM | POA: Diagnosis not present

## 2018-04-23 DIAGNOSIS — M6281 Muscle weakness (generalized): Secondary | ICD-10-CM | POA: Diagnosis not present

## 2018-04-25 ENCOUNTER — Ambulatory Visit
Admission: RE | Admit: 2018-04-25 | Discharge: 2018-04-25 | Disposition: A | Discharge status: Home / Self Care | Payer: Medicare Other | Source: Ambulatory Visit | Attending: Obstetrics and Gynecology | Admitting: Obstetrics and Gynecology

## 2018-04-25 DIAGNOSIS — Z1231 Encounter for screening mammogram for malignant neoplasm of breast: Secondary | ICD-10-CM | POA: Diagnosis not present

## 2018-04-28 ENCOUNTER — Other Ambulatory Visit: Payer: Self-pay | Admitting: Family Medicine

## 2018-04-28 DIAGNOSIS — R600 Localized edema: Secondary | ICD-10-CM

## 2018-04-29 DIAGNOSIS — R262 Difficulty in walking, not elsewhere classified: Secondary | ICD-10-CM | POA: Diagnosis not present

## 2018-04-29 DIAGNOSIS — M6281 Muscle weakness (generalized): Secondary | ICD-10-CM | POA: Diagnosis not present

## 2018-04-29 DIAGNOSIS — G5702 Lesion of sciatic nerve, left lower limb: Secondary | ICD-10-CM | POA: Diagnosis not present

## 2018-04-29 DIAGNOSIS — M25552 Pain in left hip: Secondary | ICD-10-CM | POA: Diagnosis not present

## 2018-05-01 DIAGNOSIS — R262 Difficulty in walking, not elsewhere classified: Secondary | ICD-10-CM | POA: Diagnosis not present

## 2018-05-01 DIAGNOSIS — G5702 Lesion of sciatic nerve, left lower limb: Secondary | ICD-10-CM | POA: Diagnosis not present

## 2018-05-01 DIAGNOSIS — M25552 Pain in left hip: Secondary | ICD-10-CM | POA: Diagnosis not present

## 2018-05-01 DIAGNOSIS — M6281 Muscle weakness (generalized): Secondary | ICD-10-CM | POA: Diagnosis not present

## 2018-05-06 DIAGNOSIS — G5702 Lesion of sciatic nerve, left lower limb: Secondary | ICD-10-CM | POA: Diagnosis not present

## 2018-05-06 DIAGNOSIS — M6281 Muscle weakness (generalized): Secondary | ICD-10-CM | POA: Diagnosis not present

## 2018-05-06 DIAGNOSIS — M25552 Pain in left hip: Secondary | ICD-10-CM | POA: Diagnosis not present

## 2018-05-06 DIAGNOSIS — R262 Difficulty in walking, not elsewhere classified: Secondary | ICD-10-CM | POA: Diagnosis not present

## 2018-05-08 DIAGNOSIS — R262 Difficulty in walking, not elsewhere classified: Secondary | ICD-10-CM | POA: Diagnosis not present

## 2018-05-08 DIAGNOSIS — G5702 Lesion of sciatic nerve, left lower limb: Secondary | ICD-10-CM | POA: Diagnosis not present

## 2018-05-08 DIAGNOSIS — M6281 Muscle weakness (generalized): Secondary | ICD-10-CM | POA: Diagnosis not present

## 2018-05-08 DIAGNOSIS — M25552 Pain in left hip: Secondary | ICD-10-CM | POA: Diagnosis not present

## 2018-05-14 DIAGNOSIS — R262 Difficulty in walking, not elsewhere classified: Secondary | ICD-10-CM | POA: Diagnosis not present

## 2018-05-14 DIAGNOSIS — M25552 Pain in left hip: Secondary | ICD-10-CM | POA: Diagnosis not present

## 2018-05-14 DIAGNOSIS — M6281 Muscle weakness (generalized): Secondary | ICD-10-CM | POA: Diagnosis not present

## 2018-05-14 DIAGNOSIS — G5702 Lesion of sciatic nerve, left lower limb: Secondary | ICD-10-CM | POA: Diagnosis not present

## 2018-05-19 DIAGNOSIS — M6281 Muscle weakness (generalized): Secondary | ICD-10-CM | POA: Diagnosis not present

## 2018-05-19 DIAGNOSIS — R262 Difficulty in walking, not elsewhere classified: Secondary | ICD-10-CM | POA: Diagnosis not present

## 2018-05-19 DIAGNOSIS — M25552 Pain in left hip: Secondary | ICD-10-CM | POA: Diagnosis not present

## 2018-05-19 DIAGNOSIS — G5702 Lesion of sciatic nerve, left lower limb: Secondary | ICD-10-CM | POA: Diagnosis not present

## 2018-05-22 DIAGNOSIS — R262 Difficulty in walking, not elsewhere classified: Secondary | ICD-10-CM | POA: Diagnosis not present

## 2018-05-22 DIAGNOSIS — M25552 Pain in left hip: Secondary | ICD-10-CM | POA: Diagnosis not present

## 2018-05-22 DIAGNOSIS — M6281 Muscle weakness (generalized): Secondary | ICD-10-CM | POA: Diagnosis not present

## 2018-05-22 DIAGNOSIS — G5702 Lesion of sciatic nerve, left lower limb: Secondary | ICD-10-CM | POA: Diagnosis not present

## 2018-05-27 DIAGNOSIS — R262 Difficulty in walking, not elsewhere classified: Secondary | ICD-10-CM | POA: Diagnosis not present

## 2018-05-27 DIAGNOSIS — M6281 Muscle weakness (generalized): Secondary | ICD-10-CM | POA: Diagnosis not present

## 2018-05-27 DIAGNOSIS — G5702 Lesion of sciatic nerve, left lower limb: Secondary | ICD-10-CM | POA: Diagnosis not present

## 2018-05-27 DIAGNOSIS — M25552 Pain in left hip: Secondary | ICD-10-CM | POA: Diagnosis not present

## 2018-05-29 ENCOUNTER — Encounter: Payer: Self-pay | Admitting: Family Medicine

## 2018-05-29 ENCOUNTER — Ambulatory Visit (INDEPENDENT_AMBULATORY_CARE_PROVIDER_SITE_OTHER): Payer: Medicare Other | Admitting: Family Medicine

## 2018-05-29 VITALS — BP 118/44 | HR 63 | Temp 98.1°F | Resp 16 | Ht 61.0 in | Wt 159.8 lb

## 2018-05-29 DIAGNOSIS — M25561 Pain in right knee: Secondary | ICD-10-CM | POA: Diagnosis not present

## 2018-05-29 DIAGNOSIS — I10 Essential (primary) hypertension: Secondary | ICD-10-CM

## 2018-05-29 DIAGNOSIS — M79606 Pain in leg, unspecified: Secondary | ICD-10-CM

## 2018-05-29 DIAGNOSIS — E039 Hypothyroidism, unspecified: Secondary | ICD-10-CM | POA: Diagnosis not present

## 2018-05-29 DIAGNOSIS — R609 Edema, unspecified: Secondary | ICD-10-CM | POA: Diagnosis not present

## 2018-05-29 LAB — TSH: TSH: 3.59 u[IU]/mL (ref 0.35–4.50)

## 2018-05-29 LAB — COMPREHENSIVE METABOLIC PANEL
ALT: 7 U/L (ref 0–35)
AST: 15 U/L (ref 0–37)
Albumin: 4.3 g/dL (ref 3.5–5.2)
Alkaline Phosphatase: 42 U/L (ref 39–117)
BUN: 22 mg/dL (ref 6–23)
CO2: 31 mEq/L (ref 19–32)
Calcium: 9 mg/dL (ref 8.4–10.5)
Chloride: 100 mEq/L (ref 96–112)
Creatinine, Ser: 1 mg/dL (ref 0.40–1.20)
GFR: 57.68 mL/min — ABNORMAL LOW (ref >60.00)
Glucose, Bld: 110 mg/dL — ABNORMAL HIGH (ref 70–99)
Potassium: 3.3 mEq/L — ABNORMAL LOW (ref 3.5–5.1)
Sodium: 138 mEq/L (ref 135–145)
Total Bilirubin: 0.5 mg/dL (ref 0.2–1.2)
Total Protein: 6.3 g/dL (ref 6.0–8.3)

## 2018-05-29 LAB — LIPID PANEL
Cholesterol: 142 mg/dL (ref 0–200)
HDL: 59.9 mg/dL (ref >39.00)
LDL Cholesterol: 69 mg/dL (ref 0–99)
NonHDL: 82.47
Total CHOL/HDL Ratio: 2
Triglycerides: 66 mg/dL (ref 0.0–149.0)
VLDL: 13.2 mg/dL (ref 0.0–40.0)

## 2018-05-29 MED ORDER — CHLORTHALIDONE 25 MG PO TABS: 25.0000 mg | ORAL_TABLET | Freq: Every day | ORAL | 1 refills | Status: DC

## 2018-05-29 NOTE — Progress Notes (Signed)
Patient ID: Joan [name], female    DOB: Mar 20, 1946  Age: 72 y.o. MRN: [id number]    Subjective:  Subjective  HPI Joan [name] presents for f/u cholesterol , and edema and synthroid.    Review of Systems  Constitutional: Negative for appetite change, diaphoresis, fatigue and unexpected weight change.  Eyes: Negative for pain, redness and visual disturbance.  Respiratory: Negative for cough, chest tightness, shortness of breath and wheezing.   Cardiovascular: Negative for chest pain, palpitations and leg swelling.  Endocrine: Negative for cold intolerance, heat intolerance, polydipsia, polyphagia and polyuria.  Genitourinary: Negative for difficulty urinating, dysuria and frequency.  Musculoskeletal: Positive for back pain.  Neurological: Negative for dizziness, light-headedness, numbness and headaches.    History Past Medical History:  Diagnosis Date  . Arthritis    "shoulders; wrist; probably spine" (06/24/2013)  . Chronic low back pain    followed by Dr Hardin Negus pain mgt  . Colon polyp   . Constipation   . Esophageal stricture   . Finger pain, left    2 fingers on left hand since wrist surgery  . GERD (gastroesophageal reflux disease)   . Heart murmur    "slight; not on RX" (06/24/2013)  . History of cardiac arrhythmia    cardiologist- Traci Turner  . Hx of colonic polyps 08/28/2004  . Hyperlipemia   . Hypertension   . Hypothyroidism   . Osteoarthritis of left knee    advanced  . PONV (postoperative nausea and vomiting)    Pt reports symptoms are the result of gallbladder and cholecystectomy, not anesthesia  . PVC's (premature ventricular contractions)   . Sleep apnea 1990's   "tested; tried mask; couldn't stand it; told me as long as I slept on my side I'd be ok" (06/24/2013)  . Thyroid nodule   . Tobacco abuse     She has a past surgical history that includes Lumbar fusion; Cesarean section (1972); Ankle surgery (Left, 1995); Infusion pump implantation  (1990's); Knee arthroscopy (Left, 1991; ~ 1993); Elbow surgery (1990's); Thyroidectomy (2012); Cervical spine surgery (2012); Foot surgery (Right, 2012); Shoulder arthroscopy (Left, 09/2011); Lumbar laminectomy/decompression microdiscectomy (N/A, 11/28/2012); Back surgery; Cholecystectomy (N/A, 04/16/2013); Laparoscopic lysis of adhesions (N/A, 04/16/2013); Posterior fusion lumbar spine (06/24/2013); Abdominal hysterectomy (1988); ORIF distal radius fracture (Left, 12/30/2013); ORIF wrist fracture (Left, 12/30/2013); Robotic assisted salpingo oophorectomy (Bilateral, 08/20/2014); Abdominal hysterectomy; and Total knee arthroplasty (Right, 09/09/2017).   Her family history includes Bone cancer in her sister; Breast cancer in her maternal aunt and sister; Cancer in her father; Coronary artery disease (age of onset: 34) in her mother; Diabetes in her mother and sister; Diabetes type II in her mother; Hypertension in her brother, mother, and sister; Pancreatic cancer in her father; Skin cancer in her mother; Thyroid cancer in her mother.She reports that she has been smoking cigarettes. She has a 35.00 pack-year smoking history. She has never used smokeless tobacco. She reports that she does not drink alcohol or use drugs.  Current Outpatient Medications on File Prior to Visit  Medication Sig Dispense Refill  . bisacodyl (DULCOLAX) 5 MG EC tablet Take 5 mg by mouth at bedtime.  30 tablet   . carisoprodol (SOMA) 350 MG tablet Take 175 mg by mouth 3 (three) times daily. For muscle spasms    . diclofenac sodium (VOLTAREN) 1 % GEL Apply 4 g topically 4 (four) times daily.  2  . dorzolamide-timolol (COSOPT) 22.3-6.8 MG/ML ophthalmic solution Place 1 drop into the left eye 2 (two) times  daily.    . estradiol (ESTRACE) 0.5 MG tablet Take 0.5 mg by mouth daily.  4  . fenofibrate 160 MG tablet Take 1 tablet (160 mg total) by mouth daily. 90 tablet 1  . HYDROcodone-acetaminophen (NORCO) 10-325 MG tablet Take 1 tablet by mouth  every 4 (four) hours as needed.  0  . levothyroxine (SYNTHROID, LEVOTHROID) 88 MCG tablet TAKE 1 TABLET BY MOUTH  DAILY BEFORE BREAKFAST 90 tablet 1  . simvastatin (ZOCOR) 20 MG tablet TAKE 1 TABLET BY MOUTH AT  BEDTIME 90 tablet 1  . traZODone (DESYREL) 50 MG tablet Take 1 tablet (50 mg total) by mouth at bedtime. 90 tablet 1   No current facility-administered medications on file prior to visit.      Objective:  Objective  Physical Exam  Constitutional: She is oriented to person, place, and time. She appears well-developed and well-nourished.  HENT:  Head: Normocephalic and atraumatic.  Eyes: Conjunctivae and EOM are normal.  Neck: Normal range of motion. Neck supple. No JVD present. Carotid bruit is not present. No thyromegaly present.  Cardiovascular: Normal rate, regular rhythm and normal heart sounds.  No murmur heard. Pulmonary/Chest: Effort normal and breath sounds normal. No respiratory distress. She has no wheezes. She has no rales. She exhibits no tenderness.  Musculoskeletal: She exhibits no edema.  Neurological: She is alert and oriented to person, place, and time.  Psychiatric: She has a normal mood and affect.  Nursing note and vitals reviewed.  BP (!) 118/44   Pulse 63   Temp 98.1 F (36.7 C) (Oral)   Resp 16   Ht 5\' 1"  (1.549 m)   Wt 159 lb 12.8 oz (72.5 kg) Comment: with shoes  SpO2 99%   BMI 30.19 kg/m  Wt Readings from Last 3 Encounters:  05/29/18 159 lb 12.8 oz (72.5 kg)  02/25/18 155 lb 12.8 oz (70.7 kg)  01/20/18 154 lb 6.4 oz (70 kg)     Lab Results  Component Value Date   WBC 7.8 09/11/2017   HGB 9.5 (L) 09/11/2017   HCT 27.9 (L) 09/11/2017   PLT 197 09/11/2017   GLUCOSE 110 (H) 05/29/2018   CHOL 142 05/29/2018   TRIG 66.0 05/29/2018   HDL 59.90 05/29/2018   LDLDIRECT 106.0 11/29/2015   LDLCALC 69 05/29/2018   ALT 7 05/29/2018   AST 15 05/29/2018   NA 138 05/29/2018   K 3.3 (L) 05/29/2018   CL 100 05/29/2018   CREATININE 1.00  05/29/2018   BUN 22 05/29/2018   CO2 31 05/29/2018   TSH 3.59 05/29/2018   INR 0.97 09/03/2017   HGBA1C 6.0 12/30/2017    Mm 3d Screen Breast Bilateral  Result Date: 04/28/2018 CLINICAL DATA:  Screening. EXAM: DIGITAL SCREENING BILATERAL MAMMOGRAM WITH TOMO AND CAD COMPARISON:  Previous exam(s). ACR Breast Density Category c: The breast tissue is heterogeneously dense, which may obscure small masses. FINDINGS: There are no findings suspicious for malignancy. Images were processed with CAD. IMPRESSION: No mammographic evidence of malignancy. A result letter of this screening mammogram will be mailed directly to the patient. RECOMMENDATION: Screening mammogram in one year. (Code:SM-B-01Y) BI-RADS CATEGORY  1: Negative. Electronically Signed   By: Lillia Mountain M.D.   On: 04/28/2018 11:08     Assessment & Plan:  Plan  I have discontinued Lorisa Scheid. Roemmich's potassium chloride SA, cephALEXin, and chlorthalidone. I am also having her start on chlorthalidone. Additionally, I am having her maintain her carisoprodol, bisacodyl, dorzolamide-timolol, traZODone, simvastatin, diclofenac sodium,  estradiol, HYDROcodone-acetaminophen, fenofibrate, and levothyroxine.  Meds ordered this encounter  Medications  . chlorthalidone (HYGROTON) 25 MG tablet    Sig: Take 1 tablet (25 mg total) by mouth daily.    Dispense:  90 tablet    Refill:  1    Problem List Items Addressed This Visit      Unprioritized   Edema - Primary   Relevant Medications   chlorthalidone (HYGROTON) 25 MG tablet   Other Relevant Orders   VAS [place] LOWER EXTREMITY ARTERIAL DUPLEX   Essential hypertension    Well controlled, no changes to meds. Encouraged heart healthy diet such as the DASH diet and exercise as tolerated.       Relevant Medications   chlorthalidone (HYGROTON) 25 MG tablet   Hypothyroidism    con't meds Check labs      Relevant Orders   TSH (Completed)    Other Visit Diagnoses    Hypertension, unspecified  type       Relevant Medications   chlorthalidone (HYGROTON) 25 MG tablet   Other Relevant Orders   Lipid panel (Completed)   Comprehensive metabolic panel (Completed)   Pain of lower extremity, unspecified laterality       Relevant Orders   VAS [place] LOWER EXTREMITY ARTERIAL DUPLEX      Follow-up: Return in about 6 months (around 11/27/2018), or if symptoms worsen or fail to improve, for annual exam, fasting.  [name], DO

## 2018-05-29 NOTE — Patient Instructions (Signed)
DASH Eating Plan DASH stands for "Dietary Approaches to Stop Hypertension." The DASH eating plan is a healthy eating plan that has been shown to reduce high blood pressure (hypertension). It may also reduce your risk for type 2 diabetes, heart disease, and stroke. The DASH eating plan may also help with weight loss. What are tips for following this plan? General guidelines  Avoid eating more than 2,300 mg (milligrams) of salt (sodium) a day. If you have hypertension, you may need to reduce your sodium intake to 1,500 mg a day.  Limit alcohol intake to no more than 1 drink a day for nonpregnant women and 2 drinks a day for men. One drink equals 12 oz of beer, 5 oz of wine, or 1 oz of hard liquor.  Work with your health care provider to maintain a healthy body weight or to lose weight. Ask what an ideal weight is for you.  Get at least 30 minutes of exercise that causes your heart to beat faster (aerobic exercise) most days of the week. Activities may include walking, swimming, or biking.  Work with your health care provider or diet and nutrition specialist (dietitian) to adjust your eating plan to your individual calorie needs. Reading food labels  Check food labels for the amount of sodium per serving. Choose foods with less than 5 percent of the Daily Value of sodium. Generally, foods with less than 300 mg of sodium per serving fit into this eating plan.  To find whole grains, look for the word "whole" as the first word in the ingredient list. Shopping  Buy products labeled as "low-sodium" or "no salt added."  Buy fresh foods. Avoid canned foods and premade or frozen meals. Cooking  Avoid adding salt when cooking. Use salt-free seasonings or herbs instead of table salt or sea salt. Check with your health care provider or pharmacist before using salt substitutes.  Do not fry foods. Cook foods using healthy methods such as baking, boiling, grilling, and broiling instead.  Cook with  heart-healthy oils, such as olive, canola, soybean, or sunflower oil. Meal planning   Eat a balanced diet that includes: ? 5 or more servings of fruits and vegetables each day. At each meal, try to fill half of your plate with fruits and vegetables. ? Up to 6-8 servings of whole grains each day. ? Less than 6 oz of lean meat, poultry, or fish each day. A 3-oz serving of meat is about the same size as a deck of cards. One egg equals 1 oz. ? 2 servings of low-fat dairy each day. ? A serving of nuts, seeds, or beans 5 times each week. ? Heart-healthy fats. Healthy fats called Omega-3 fatty acids are found in foods such as flaxseeds and coldwater fish, like sardines, salmon, and mackerel.  Limit how much you eat of the following: ? Canned or prepackaged foods. ? Food that is high in trans fat, such as fried foods. ? Food that is high in saturated fat, such as fatty meat. ? Sweets, desserts, sugary drinks, and other foods with added sugar. ? Full-fat dairy products.  Do not salt foods before eating.  Try to eat at least 2 vegetarian meals each week.  Eat more home-cooked food and less restaurant, buffet, and fast food.  When eating at a restaurant, ask that your food be prepared with less salt or no salt, if possible. What foods are recommended? The items listed may not be a complete list. Talk with your dietitian about what   dietary choices are best for you. Grains Whole-grain or whole-wheat bread. Whole-grain or whole-wheat pasta. Brown rice. Oatmeal. Quinoa. Bulgur. Whole-grain and low-sodium cereals. Pita bread. Low-fat, low-sodium crackers. Whole-wheat flour tortillas. Vegetables Fresh or frozen vegetables (raw, steamed, roasted, or grilled). Low-sodium or reduced-sodium tomato and vegetable juice. Low-sodium or reduced-sodium tomato sauce and tomato paste. Low-sodium or reduced-sodium canned vegetables. Fruits All fresh, dried, or frozen fruit. Canned fruit in natural juice (without  added sugar). Meat and other protein foods Skinless chicken or turkey. Ground chicken or turkey. Pork with fat trimmed off. Fish and seafood. Egg whites. Dried beans, peas, or lentils. Unsalted nuts, nut butters, and seeds. Unsalted canned beans. Lean cuts of beef with fat trimmed off. Low-sodium, lean deli meat. Dairy Low-fat (1%) or fat-free (skim) milk. Fat-free, low-fat, or reduced-fat cheeses. Nonfat, low-sodium ricotta or cottage cheese. Low-fat or nonfat yogurt. Low-fat, low-sodium cheese. Fats and oils Soft margarine without trans fats. Vegetable oil. Low-fat, reduced-fat, or light mayonnaise and salad dressings (reduced-sodium). Canola, safflower, olive, soybean, and sunflower oils. Avocado. Seasoning and other foods Herbs. Spices. Seasoning mixes without salt. Unsalted popcorn and pretzels. Fat-free sweets. What foods are not recommended? The items listed may not be a complete list. Talk with your dietitian about what dietary choices are best for you. Grains Baked goods made with fat, such as croissants, muffins, or some breads. Dry pasta or rice meal packs. Vegetables Creamed or fried vegetables. Vegetables in a cheese sauce. Regular canned vegetables (not low-sodium or reduced-sodium). Regular canned tomato sauce and paste (not low-sodium or reduced-sodium). Regular tomato and vegetable juice (not low-sodium or reduced-sodium). Pickles. Olives. Fruits Canned fruit in a light or heavy syrup. Fried fruit. Fruit in cream or butter sauce. Meat and other protein foods Fatty cuts of meat. Ribs. Fried meat. Bacon. Sausage. Bologna and other processed lunch meats. Salami. Fatback. Hotdogs. Bratwurst. Salted nuts and seeds. Canned beans with added salt. Canned or smoked fish. Whole eggs or egg yolks. Chicken or turkey with skin. Dairy Whole or 2% milk, cream, and half-and-half. Whole or full-fat cream cheese. Whole-fat or sweetened yogurt. Full-fat cheese. Nondairy creamers. Whipped toppings.  Processed cheese and cheese spreads. Fats and oils Butter. Stick margarine. Lard. Shortening. Ghee. Bacon fat. Tropical oils, such as coconut, palm kernel, or palm oil. Seasoning and other foods Salted popcorn and pretzels. Onion salt, garlic salt, seasoned salt, table salt, and sea salt. Worcestershire sauce. Tartar sauce. Barbecue sauce. Teriyaki sauce. Soy sauce, including reduced-sodium. Steak sauce. Canned and packaged gravies. Fish sauce. Oyster sauce. Cocktail sauce. Horseradish that you find on the shelf. Ketchup. Mustard. Meat flavorings and tenderizers. Bouillon cubes. Hot sauce and Tabasco sauce. Premade or packaged marinades. Premade or packaged taco seasonings. Relishes. Regular salad dressings. Where to find more information:  National Heart, Lung, and Blood Institute: www.nhlbi.nih.gov  American Heart Association: www.heart.org Summary  The DASH eating plan is a healthy eating plan that has been shown to reduce high blood pressure (hypertension). It may also reduce your risk for type 2 diabetes, heart disease, and stroke.  With the DASH eating plan, you should limit salt (sodium) intake to 2,300 mg a day. If you have hypertension, you may need to reduce your sodium intake to 1,500 mg a day.  When on the DASH eating plan, aim to eat more fresh fruits and vegetables, whole grains, lean proteins, low-fat dairy, and heart-healthy fats.  Work with your health care provider or diet and nutrition specialist (dietitian) to adjust your eating plan to your individual   calorie needs. This information is not intended to replace advice given to you by your health care provider. Make sure you discuss any questions you have with your health care provider. Document Released: 06/14/2011 Document Revised: 06/18/2016 Document Reviewed: 06/18/2016 Elsevier Interactive Patient Education  2018 Elsevier Inc.  

## 2018-05-29 NOTE — Assessment & Plan Note (Signed)
Well controlled, no changes to meds. Encouraged heart healthy diet such as the DASH diet and exercise as tolerated.  °

## 2018-05-29 NOTE — Assessment & Plan Note (Signed)
con't meds  Check labs 

## 2018-05-30 DIAGNOSIS — M25552 Pain in left hip: Secondary | ICD-10-CM | POA: Diagnosis not present

## 2018-05-30 DIAGNOSIS — M6281 Muscle weakness (generalized): Secondary | ICD-10-CM | POA: Diagnosis not present

## 2018-05-30 DIAGNOSIS — R262 Difficulty in walking, not elsewhere classified: Secondary | ICD-10-CM | POA: Diagnosis not present

## 2018-05-30 DIAGNOSIS — G5702 Lesion of sciatic nerve, left lower limb: Secondary | ICD-10-CM | POA: Diagnosis not present

## 2018-06-02 DIAGNOSIS — G894 Chronic pain syndrome: Secondary | ICD-10-CM | POA: Diagnosis not present

## 2018-06-02 DIAGNOSIS — M961 Postlaminectomy syndrome, not elsewhere classified: Secondary | ICD-10-CM | POA: Diagnosis not present

## 2018-06-02 DIAGNOSIS — Z79891 Long term (current) use of opiate analgesic: Secondary | ICD-10-CM | POA: Diagnosis not present

## 2018-06-02 DIAGNOSIS — G5702 Lesion of sciatic nerve, left lower limb: Secondary | ICD-10-CM | POA: Diagnosis not present

## 2018-06-03 DIAGNOSIS — M25552 Pain in left hip: Secondary | ICD-10-CM | POA: Diagnosis not present

## 2018-06-03 DIAGNOSIS — M6281 Muscle weakness (generalized): Secondary | ICD-10-CM | POA: Diagnosis not present

## 2018-06-03 DIAGNOSIS — R262 Difficulty in walking, not elsewhere classified: Secondary | ICD-10-CM | POA: Diagnosis not present

## 2018-06-03 DIAGNOSIS — G5702 Lesion of sciatic nerve, left lower limb: Secondary | ICD-10-CM | POA: Diagnosis not present

## 2018-06-10 ENCOUNTER — Encounter: Payer: Self-pay | Admitting: Family Medicine

## 2018-06-10 DIAGNOSIS — R262 Difficulty in walking, not elsewhere classified: Secondary | ICD-10-CM | POA: Diagnosis not present

## 2018-06-10 DIAGNOSIS — M25552 Pain in left hip: Secondary | ICD-10-CM | POA: Diagnosis not present

## 2018-06-10 DIAGNOSIS — R609 Edema, unspecified: Secondary | ICD-10-CM

## 2018-06-10 DIAGNOSIS — G5702 Lesion of sciatic nerve, left lower limb: Secondary | ICD-10-CM | POA: Diagnosis not present

## 2018-06-10 DIAGNOSIS — M6281 Muscle weakness (generalized): Secondary | ICD-10-CM | POA: Diagnosis not present

## 2018-06-12 DIAGNOSIS — M6281 Muscle weakness (generalized): Secondary | ICD-10-CM | POA: Diagnosis not present

## 2018-06-12 DIAGNOSIS — M5440 Lumbago with sciatica, unspecified side: Secondary | ICD-10-CM | POA: Diagnosis not present

## 2018-06-12 DIAGNOSIS — M25552 Pain in left hip: Secondary | ICD-10-CM | POA: Diagnosis not present

## 2018-06-12 DIAGNOSIS — G5702 Lesion of sciatic nerve, left lower limb: Secondary | ICD-10-CM | POA: Diagnosis not present

## 2018-06-12 DIAGNOSIS — R262 Difficulty in walking, not elsewhere classified: Secondary | ICD-10-CM | POA: Diagnosis not present

## 2018-06-12 NOTE — Telephone Encounter (Signed)
[place] ordered.  Did not see where it was ordered when she was here.    Can you help with swelling and  medication problem?

## 2018-06-18 ENCOUNTER — Ambulatory Visit (HOSPITAL_COMMUNITY)
Admission: RE | Admit: 2018-06-18 | Discharge: 2018-06-18 | Disposition: A | Discharge status: Home / Self Care | Payer: Medicare Other | Source: Ambulatory Visit | Attending: Family Medicine | Admitting: Family Medicine

## 2018-06-18 DIAGNOSIS — R600 Localized edema: Secondary | ICD-10-CM | POA: Diagnosis not present

## 2018-06-18 DIAGNOSIS — R609 Edema, unspecified: Secondary | ICD-10-CM | POA: Insufficient documentation

## 2018-06-19 DIAGNOSIS — R262 Difficulty in walking, not elsewhere classified: Secondary | ICD-10-CM | POA: Diagnosis not present

## 2018-06-19 DIAGNOSIS — M6281 Muscle weakness (generalized): Secondary | ICD-10-CM | POA: Diagnosis not present

## 2018-06-19 DIAGNOSIS — M25552 Pain in left hip: Secondary | ICD-10-CM | POA: Diagnosis not present

## 2018-06-19 DIAGNOSIS — G5702 Lesion of sciatic nerve, left lower limb: Secondary | ICD-10-CM | POA: Diagnosis not present

## 2018-06-25 DIAGNOSIS — G5702 Lesion of sciatic nerve, left lower limb: Secondary | ICD-10-CM | POA: Diagnosis not present

## 2018-06-25 DIAGNOSIS — M6281 Muscle weakness (generalized): Secondary | ICD-10-CM | POA: Diagnosis not present

## 2018-06-25 DIAGNOSIS — R262 Difficulty in walking, not elsewhere classified: Secondary | ICD-10-CM | POA: Diagnosis not present

## 2018-06-25 DIAGNOSIS — M25552 Pain in left hip: Secondary | ICD-10-CM | POA: Diagnosis not present

## 2018-07-14 ENCOUNTER — Other Ambulatory Visit: Payer: Self-pay | Admitting: Family Medicine

## 2018-07-14 DIAGNOSIS — G4700 Insomnia, unspecified: Secondary | ICD-10-CM

## 2018-07-17 DIAGNOSIS — R030 Elevated blood-pressure reading, without diagnosis of hypertension: Secondary | ICD-10-CM | POA: Diagnosis not present

## 2018-07-17 DIAGNOSIS — M5440 Lumbago with sciatica, unspecified side: Secondary | ICD-10-CM | POA: Diagnosis not present

## 2018-07-18 ENCOUNTER — Other Ambulatory Visit: Payer: Self-pay | Admitting: Family Medicine

## 2018-07-18 ENCOUNTER — Telehealth: Payer: Self-pay | Admitting: Family Medicine

## 2018-07-18 DIAGNOSIS — R609 Edema, unspecified: Secondary | ICD-10-CM

## 2018-07-18 MED ORDER — FUROSEMIDE 20 MG PO TABS: 20.0000 mg | ORAL_TABLET | Freq: Every day | ORAL | 3 refills | Status: DC

## 2018-07-18 NOTE — Telephone Encounter (Signed)
Copied from [place] [id number]. Topic: Quick Communication - Rx Refill/Question >> Jul 18, 2018 11:19 AM Bea Graff, NT wrote: Medication: Furosemide 20 MG   Has the patient contacted their pharmacy? Yes.   (Agent: If no, request that the patient contact the pharmacy for the refill.) (Agent: If yes, when and what did the pharmacy advise?)  Preferred Pharmacy (with phone number or street name): [place], [place] - [facility] [phone] (Phone) [phone] (Fax)    Agent: Please be advised that RX refills may take up to 3 business days. We ask that you follow-up with your pharmacy.

## 2018-07-18 NOTE — Telephone Encounter (Signed)
Yes -- she was taking it Not sure why it was taken off list

## 2018-07-18 NOTE — Telephone Encounter (Signed)
Last refill was 12/30/17  Routing back to office. Med is not on active medication list.

## 2018-07-22 NOTE — Telephone Encounter (Signed)
Medication sent in. 

## 2018-07-23 DIAGNOSIS — G5702 Lesion of sciatic nerve, left lower limb: Secondary | ICD-10-CM | POA: Diagnosis not present

## 2018-07-23 DIAGNOSIS — M6281 Muscle weakness (generalized): Secondary | ICD-10-CM | POA: Diagnosis not present

## 2018-07-23 DIAGNOSIS — M25552 Pain in left hip: Secondary | ICD-10-CM | POA: Diagnosis not present

## 2018-07-23 DIAGNOSIS — R262 Difficulty in walking, not elsewhere classified: Secondary | ICD-10-CM | POA: Diagnosis not present

## 2018-07-28 ENCOUNTER — Other Ambulatory Visit: Payer: Self-pay | Admitting: Family Medicine

## 2018-07-28 DIAGNOSIS — E785 Hyperlipidemia, unspecified: Secondary | ICD-10-CM

## 2018-07-28 DIAGNOSIS — M961 Postlaminectomy syndrome, not elsewhere classified: Secondary | ICD-10-CM | POA: Diagnosis not present

## 2018-07-28 DIAGNOSIS — G894 Chronic pain syndrome: Secondary | ICD-10-CM | POA: Diagnosis not present

## 2018-07-28 DIAGNOSIS — G5702 Lesion of sciatic nerve, left lower limb: Secondary | ICD-10-CM | POA: Diagnosis not present

## 2018-07-28 DIAGNOSIS — Z79891 Long term (current) use of opiate analgesic: Secondary | ICD-10-CM | POA: Diagnosis not present

## 2018-07-30 DIAGNOSIS — R262 Difficulty in walking, not elsewhere classified: Secondary | ICD-10-CM | POA: Diagnosis not present

## 2018-07-30 DIAGNOSIS — M6281 Muscle weakness (generalized): Secondary | ICD-10-CM | POA: Diagnosis not present

## 2018-07-30 DIAGNOSIS — M25552 Pain in left hip: Secondary | ICD-10-CM | POA: Diagnosis not present

## 2018-07-30 DIAGNOSIS — G5702 Lesion of sciatic nerve, left lower limb: Secondary | ICD-10-CM | POA: Diagnosis not present

## 2018-08-06 DIAGNOSIS — M25552 Pain in left hip: Secondary | ICD-10-CM | POA: Diagnosis not present

## 2018-08-06 DIAGNOSIS — M6281 Muscle weakness (generalized): Secondary | ICD-10-CM | POA: Diagnosis not present

## 2018-08-06 DIAGNOSIS — R262 Difficulty in walking, not elsewhere classified: Secondary | ICD-10-CM | POA: Diagnosis not present

## 2018-08-06 DIAGNOSIS — G5702 Lesion of sciatic nerve, left lower limb: Secondary | ICD-10-CM | POA: Diagnosis not present

## 2018-08-13 DIAGNOSIS — M6281 Muscle weakness (generalized): Secondary | ICD-10-CM | POA: Diagnosis not present

## 2018-08-13 DIAGNOSIS — R262 Difficulty in walking, not elsewhere classified: Secondary | ICD-10-CM | POA: Diagnosis not present

## 2018-08-13 DIAGNOSIS — M25552 Pain in left hip: Secondary | ICD-10-CM | POA: Diagnosis not present

## 2018-08-13 DIAGNOSIS — G5702 Lesion of sciatic nerve, left lower limb: Secondary | ICD-10-CM | POA: Diagnosis not present

## 2018-08-14 DIAGNOSIS — Z471 Aftercare following joint replacement surgery: Secondary | ICD-10-CM | POA: Diagnosis not present

## 2018-08-14 DIAGNOSIS — Z96651 Presence of right artificial knee joint: Secondary | ICD-10-CM | POA: Diagnosis not present

## 2018-08-20 DIAGNOSIS — M25552 Pain in left hip: Secondary | ICD-10-CM | POA: Diagnosis not present

## 2018-08-20 DIAGNOSIS — G5702 Lesion of sciatic nerve, left lower limb: Secondary | ICD-10-CM | POA: Diagnosis not present

## 2018-08-20 DIAGNOSIS — R262 Difficulty in walking, not elsewhere classified: Secondary | ICD-10-CM | POA: Diagnosis not present

## 2018-08-20 DIAGNOSIS — M6281 Muscle weakness (generalized): Secondary | ICD-10-CM | POA: Diagnosis not present

## 2018-08-26 ENCOUNTER — Other Ambulatory Visit: Payer: Self-pay | Admitting: Family Medicine

## 2018-09-01 ENCOUNTER — Other Ambulatory Visit: Payer: Self-pay | Admitting: Family Medicine

## 2018-09-01 NOTE — Telephone Encounter (Signed)
Copied from [place] [id number]. Topic: Quick Communication - Rx Refill/Question >> Sep 01, 2018  9:25 AM Percell Belt A wrote: Medication: levothyroxine (SYNTHROID, LEVOTHROID) 88 MCG tablet [[id number]]- pt needs a 10 day supply sent to local pharmacy until the mail order ones come in.  Has the patient contacted their pharmacy? No. (Agent: If no, request that the patient contact the pharmacy for the refill.) (Agent: If yes, when and what did the pharmacy advise?)  Preferred Pharmacy (with phone number or street name): [place], [place] - [address] [phone] (Phone)   Agent: Please be advised that RX refills may take up to 3 business days. We ask that you follow-up with your pharmacy.

## 2018-09-02 NOTE — Telephone Encounter (Signed)
LOV--07/18/2018  HCTZ 25mg  ---is not med list please advise

## 2018-09-04 ENCOUNTER — Telehealth: Payer: Self-pay | Admitting: Family Medicine

## 2018-09-04 MED ORDER — LEVOTHYROXINE SODIUM 88 MCG PO TABS: ORAL_TABLET | ORAL | 0 refills | Status: DC

## 2018-09-04 NOTE — Addendum Note (Signed)
Addended by: Fransisca Connors A on: 09/04/2018 09:40 AM   Modules accepted: Orders

## 2018-09-04 NOTE — Telephone Encounter (Signed)
Pt requested 10 day supply until mail order refills are delivered Requested Prescriptions  Pending Prescriptions Disp Refills  . levothyroxine (SYNTHROID, LEVOTHROID) 88 MCG tablet 10 tablet 0    Sig: TAKE 1 TABLET BY MOUTH  DAILY BEFORE BREAKFAST     Endocrinology:  Hypothyroid Agents Failed - 09/04/2018  9:40 AM      Failed - TSH needs to be rechecked within 3 months after an abnormal result. Refill until TSH is due.      Passed - TSH in normal range and within 360 days    TSH  Date Value Ref Range Status  05/29/2018 3.59 0.35 - 4.50 uIU/mL Final         Passed - Valid encounter within last 12 months    Recent Outpatient Visits          3 months ago Edema, unspecified type   [facility], DO   6 months ago Lower extremity edema   [facility] R, DO   7 months ago Lower extremity edema   [facility] [place], DO   8 months ago Lower extremity edema   [facility] [place], DO   1 year ago Preventative health care   [facility], DO      Future Appointments            In 3 months [place], [place] at [employer] [facility], [facility]

## 2018-09-04 NOTE — Telephone Encounter (Signed)
Pt checking status on her med refill.  

## 2018-09-04 NOTE — Telephone Encounter (Signed)
Copied from [place] [id number]. Topic: Quick Communication - See Telephone Encounter >> Sep 04, 2018  9:31 AM Bea Graff, NT wrote: CRM for notification. See Telephone encounter for: 09/04/18. Pt would like to see if she is suppose to be still taking the hydrochlorothiazide (HYDRODIURIL) 25 MG tablet and if so she needs a refill sent to [facility]. Please advise.

## 2018-09-08 ENCOUNTER — Other Ambulatory Visit: Payer: Self-pay | Admitting: Family Medicine

## 2018-09-08 DIAGNOSIS — E785 Hyperlipidemia, unspecified: Secondary | ICD-10-CM

## 2018-09-08 DIAGNOSIS — R600 Localized edema: Secondary | ICD-10-CM

## 2018-09-08 MED ORDER — HYDROCHLOROTHIAZIDE 25 MG PO TABS: 25.0000 mg | ORAL_TABLET | Freq: Every day | ORAL | 3 refills | Status: DC

## 2018-09-08 NOTE — Telephone Encounter (Signed)
done

## 2018-09-08 NOTE — Telephone Encounter (Signed)
Spoke with patient she said that she was taken off of hctz because you had thought I was breaking out her legs and switched her to spironolactone on 12/31/17.  She stated that was not working back on 01/20/18 and you changed her to chlorthalidone.  She is not stating that this one is now not working and would like to be back on the hctz.  She is also taking furosemide as well.  She stated that she has been doing the furosemide and hctz for a while.  Can you please advise on this refill request.

## 2018-09-09 ENCOUNTER — Other Ambulatory Visit: Payer: Self-pay | Admitting: *Deleted

## 2018-09-09 DIAGNOSIS — M25561 Pain in right knee: Secondary | ICD-10-CM | POA: Diagnosis not present

## 2018-09-09 DIAGNOSIS — Z96651 Presence of right artificial knee joint: Secondary | ICD-10-CM | POA: Diagnosis not present

## 2018-09-09 DIAGNOSIS — M659 Synovitis and tenosynovitis, unspecified: Secondary | ICD-10-CM | POA: Diagnosis not present

## 2018-09-09 DIAGNOSIS — M1711 Unilateral primary osteoarthritis, right knee: Secondary | ICD-10-CM | POA: Diagnosis not present

## 2018-09-09 DIAGNOSIS — M25861 Other specified joint disorders, right knee: Secondary | ICD-10-CM | POA: Diagnosis not present

## 2018-09-09 MED ORDER — HYDROCHLOROTHIAZIDE 25 MG PO TABS
25.0000 mg | ORAL_TABLET | Freq: Every day | ORAL | 3 refills | Status: DC
Start: 2018-09-09 — End: 2019-02-10

## 2018-09-09 NOTE — Telephone Encounter (Signed)
Rx was sent to [facility].  Rx cancelled at [place] and sent to [facility].  Patient notified that rx was sent in.

## 2018-09-09 NOTE — Addendum Note (Signed)
Addended by: Kem Boroughs D on: 09/09/2018 04:45 PM   Modules accepted: Orders

## 2018-09-15 DIAGNOSIS — M25869 Other specified joint disorders, unspecified knee: Secondary | ICD-10-CM | POA: Insufficient documentation

## 2018-09-22 DIAGNOSIS — Z79891 Long term (current) use of opiate analgesic: Secondary | ICD-10-CM | POA: Diagnosis not present

## 2018-09-22 DIAGNOSIS — G894 Chronic pain syndrome: Secondary | ICD-10-CM | POA: Diagnosis not present

## 2018-09-22 DIAGNOSIS — M961 Postlaminectomy syndrome, not elsewhere classified: Secondary | ICD-10-CM | POA: Diagnosis not present

## 2018-09-22 DIAGNOSIS — G5702 Lesion of sciatic nerve, left lower limb: Secondary | ICD-10-CM | POA: Diagnosis not present

## 2018-10-21 ENCOUNTER — Other Ambulatory Visit: Payer: Self-pay | Admitting: *Deleted

## 2018-10-21 DIAGNOSIS — R609 Edema, unspecified: Secondary | ICD-10-CM

## 2018-10-21 MED ORDER — FUROSEMIDE 20 MG PO TABS: 20.0000 mg | ORAL_TABLET | Freq: Every day | ORAL | 1 refills | Status: DC

## 2018-11-02 ENCOUNTER — Other Ambulatory Visit: Payer: Self-pay | Admitting: Family Medicine

## 2018-12-02 DIAGNOSIS — M25552 Pain in left hip: Secondary | ICD-10-CM | POA: Diagnosis not present

## 2018-12-02 DIAGNOSIS — M25562 Pain in left knee: Secondary | ICD-10-CM | POA: Diagnosis not present

## 2018-12-02 DIAGNOSIS — M6281 Muscle weakness (generalized): Secondary | ICD-10-CM | POA: Diagnosis not present

## 2018-12-02 DIAGNOSIS — M79604 Pain in right leg: Secondary | ICD-10-CM | POA: Diagnosis not present

## 2018-12-04 DIAGNOSIS — M79604 Pain in right leg: Secondary | ICD-10-CM | POA: Diagnosis not present

## 2018-12-04 DIAGNOSIS — M25552 Pain in left hip: Secondary | ICD-10-CM | POA: Diagnosis not present

## 2018-12-04 DIAGNOSIS — M25562 Pain in left knee: Secondary | ICD-10-CM | POA: Diagnosis not present

## 2018-12-04 DIAGNOSIS — M6281 Muscle weakness (generalized): Secondary | ICD-10-CM | POA: Diagnosis not present

## 2018-12-08 DIAGNOSIS — M25552 Pain in left hip: Secondary | ICD-10-CM | POA: Diagnosis not present

## 2018-12-08 DIAGNOSIS — M6281 Muscle weakness (generalized): Secondary | ICD-10-CM | POA: Diagnosis not present

## 2018-12-08 DIAGNOSIS — M25562 Pain in left knee: Secondary | ICD-10-CM | POA: Diagnosis not present

## 2018-12-08 DIAGNOSIS — M79604 Pain in right leg: Secondary | ICD-10-CM | POA: Diagnosis not present

## 2018-12-09 ENCOUNTER — Encounter: Payer: Medicare Other | Admitting: Family Medicine

## 2018-12-11 DIAGNOSIS — M6281 Muscle weakness (generalized): Secondary | ICD-10-CM | POA: Diagnosis not present

## 2018-12-11 DIAGNOSIS — M25552 Pain in left hip: Secondary | ICD-10-CM | POA: Diagnosis not present

## 2018-12-11 DIAGNOSIS — M25562 Pain in left knee: Secondary | ICD-10-CM | POA: Diagnosis not present

## 2018-12-11 DIAGNOSIS — M79604 Pain in right leg: Secondary | ICD-10-CM | POA: Diagnosis not present

## 2018-12-15 DIAGNOSIS — M6281 Muscle weakness (generalized): Secondary | ICD-10-CM | POA: Diagnosis not present

## 2018-12-15 DIAGNOSIS — M79604 Pain in right leg: Secondary | ICD-10-CM | POA: Diagnosis not present

## 2018-12-15 DIAGNOSIS — M25552 Pain in left hip: Secondary | ICD-10-CM | POA: Diagnosis not present

## 2018-12-15 DIAGNOSIS — M25562 Pain in left knee: Secondary | ICD-10-CM | POA: Diagnosis not present

## 2018-12-18 DIAGNOSIS — M961 Postlaminectomy syndrome, not elsewhere classified: Secondary | ICD-10-CM | POA: Diagnosis not present

## 2018-12-18 DIAGNOSIS — Z79891 Long term (current) use of opiate analgesic: Secondary | ICD-10-CM | POA: Diagnosis not present

## 2018-12-18 DIAGNOSIS — M6281 Muscle weakness (generalized): Secondary | ICD-10-CM | POA: Diagnosis not present

## 2018-12-18 DIAGNOSIS — M25562 Pain in left knee: Secondary | ICD-10-CM | POA: Diagnosis not present

## 2018-12-18 DIAGNOSIS — G5702 Lesion of sciatic nerve, left lower limb: Secondary | ICD-10-CM | POA: Diagnosis not present

## 2018-12-18 DIAGNOSIS — G894 Chronic pain syndrome: Secondary | ICD-10-CM | POA: Diagnosis not present

## 2018-12-18 DIAGNOSIS — M79604 Pain in right leg: Secondary | ICD-10-CM | POA: Diagnosis not present

## 2018-12-18 DIAGNOSIS — M25552 Pain in left hip: Secondary | ICD-10-CM | POA: Diagnosis not present

## 2018-12-23 DIAGNOSIS — M79604 Pain in right leg: Secondary | ICD-10-CM | POA: Diagnosis not present

## 2018-12-23 DIAGNOSIS — M25552 Pain in left hip: Secondary | ICD-10-CM | POA: Diagnosis not present

## 2018-12-23 DIAGNOSIS — M25562 Pain in left knee: Secondary | ICD-10-CM | POA: Diagnosis not present

## 2018-12-23 DIAGNOSIS — M6281 Muscle weakness (generalized): Secondary | ICD-10-CM | POA: Diagnosis not present

## 2018-12-25 DIAGNOSIS — M6281 Muscle weakness (generalized): Secondary | ICD-10-CM | POA: Diagnosis not present

## 2018-12-25 DIAGNOSIS — M79604 Pain in right leg: Secondary | ICD-10-CM | POA: Diagnosis not present

## 2018-12-25 DIAGNOSIS — M25562 Pain in left knee: Secondary | ICD-10-CM | POA: Diagnosis not present

## 2018-12-25 DIAGNOSIS — M25552 Pain in left hip: Secondary | ICD-10-CM | POA: Diagnosis not present

## 2018-12-30 DIAGNOSIS — M6281 Muscle weakness (generalized): Secondary | ICD-10-CM | POA: Diagnosis not present

## 2018-12-30 DIAGNOSIS — M25552 Pain in left hip: Secondary | ICD-10-CM | POA: Diagnosis not present

## 2018-12-30 DIAGNOSIS — M25562 Pain in left knee: Secondary | ICD-10-CM | POA: Diagnosis not present

## 2018-12-30 DIAGNOSIS — M79604 Pain in right leg: Secondary | ICD-10-CM | POA: Diagnosis not present

## 2019-01-01 DIAGNOSIS — M6281 Muscle weakness (generalized): Secondary | ICD-10-CM | POA: Diagnosis not present

## 2019-01-01 DIAGNOSIS — M25552 Pain in left hip: Secondary | ICD-10-CM | POA: Diagnosis not present

## 2019-01-01 DIAGNOSIS — M25562 Pain in left knee: Secondary | ICD-10-CM | POA: Diagnosis not present

## 2019-01-01 DIAGNOSIS — M79604 Pain in right leg: Secondary | ICD-10-CM | POA: Diagnosis not present

## 2019-01-06 DIAGNOSIS — M25552 Pain in left hip: Secondary | ICD-10-CM | POA: Diagnosis not present

## 2019-01-06 DIAGNOSIS — M79604 Pain in right leg: Secondary | ICD-10-CM | POA: Diagnosis not present

## 2019-01-06 DIAGNOSIS — M6281 Muscle weakness (generalized): Secondary | ICD-10-CM | POA: Diagnosis not present

## 2019-01-06 DIAGNOSIS — M25562 Pain in left knee: Secondary | ICD-10-CM | POA: Diagnosis not present

## 2019-01-08 DIAGNOSIS — M25552 Pain in left hip: Secondary | ICD-10-CM | POA: Diagnosis not present

## 2019-01-08 DIAGNOSIS — M6281 Muscle weakness (generalized): Secondary | ICD-10-CM | POA: Diagnosis not present

## 2019-01-08 DIAGNOSIS — M79604 Pain in right leg: Secondary | ICD-10-CM | POA: Diagnosis not present

## 2019-01-08 DIAGNOSIS — M25562 Pain in left knee: Secondary | ICD-10-CM | POA: Diagnosis not present

## 2019-01-13 DIAGNOSIS — M25552 Pain in left hip: Secondary | ICD-10-CM | POA: Diagnosis not present

## 2019-01-13 DIAGNOSIS — M79604 Pain in right leg: Secondary | ICD-10-CM | POA: Diagnosis not present

## 2019-01-13 DIAGNOSIS — M6281 Muscle weakness (generalized): Secondary | ICD-10-CM | POA: Diagnosis not present

## 2019-01-13 DIAGNOSIS — M25562 Pain in left knee: Secondary | ICD-10-CM | POA: Diagnosis not present

## 2019-01-15 ENCOUNTER — Other Ambulatory Visit: Payer: Self-pay | Admitting: Family Medicine

## 2019-01-15 DIAGNOSIS — E785 Hyperlipidemia, unspecified: Secondary | ICD-10-CM

## 2019-01-15 DIAGNOSIS — M25562 Pain in left knee: Secondary | ICD-10-CM | POA: Diagnosis not present

## 2019-01-15 DIAGNOSIS — M79604 Pain in right leg: Secondary | ICD-10-CM | POA: Diagnosis not present

## 2019-01-15 DIAGNOSIS — M6281 Muscle weakness (generalized): Secondary | ICD-10-CM | POA: Diagnosis not present

## 2019-01-15 DIAGNOSIS — M25552 Pain in left hip: Secondary | ICD-10-CM | POA: Diagnosis not present

## 2019-01-17 ENCOUNTER — Other Ambulatory Visit: Payer: Self-pay | Admitting: Family Medicine

## 2019-01-17 DIAGNOSIS — G4700 Insomnia, unspecified: Secondary | ICD-10-CM

## 2019-01-20 DIAGNOSIS — M79604 Pain in right leg: Secondary | ICD-10-CM | POA: Diagnosis not present

## 2019-01-20 DIAGNOSIS — M6281 Muscle weakness (generalized): Secondary | ICD-10-CM | POA: Diagnosis not present

## 2019-01-20 DIAGNOSIS — M25552 Pain in left hip: Secondary | ICD-10-CM | POA: Diagnosis not present

## 2019-01-20 DIAGNOSIS — M25562 Pain in left knee: Secondary | ICD-10-CM | POA: Diagnosis not present

## 2019-01-20 NOTE — Telephone Encounter (Signed)
Pt stated she is completely out of the medication and she would like to pick it up today because she does not travel at night.

## 2019-01-22 DIAGNOSIS — M25552 Pain in left hip: Secondary | ICD-10-CM | POA: Diagnosis not present

## 2019-01-22 DIAGNOSIS — M79604 Pain in right leg: Secondary | ICD-10-CM | POA: Diagnosis not present

## 2019-01-22 DIAGNOSIS — M25562 Pain in left knee: Secondary | ICD-10-CM | POA: Diagnosis not present

## 2019-01-22 DIAGNOSIS — M6281 Muscle weakness (generalized): Secondary | ICD-10-CM | POA: Diagnosis not present

## 2019-01-27 DIAGNOSIS — M79604 Pain in right leg: Secondary | ICD-10-CM | POA: Diagnosis not present

## 2019-01-27 DIAGNOSIS — M6281 Muscle weakness (generalized): Secondary | ICD-10-CM | POA: Diagnosis not present

## 2019-01-27 DIAGNOSIS — M25552 Pain in left hip: Secondary | ICD-10-CM | POA: Diagnosis not present

## 2019-01-27 DIAGNOSIS — M25562 Pain in left knee: Secondary | ICD-10-CM | POA: Diagnosis not present

## 2019-01-29 DIAGNOSIS — M25552 Pain in left hip: Secondary | ICD-10-CM | POA: Diagnosis not present

## 2019-01-29 DIAGNOSIS — M25562 Pain in left knee: Secondary | ICD-10-CM | POA: Diagnosis not present

## 2019-01-29 DIAGNOSIS — M6281 Muscle weakness (generalized): Secondary | ICD-10-CM | POA: Diagnosis not present

## 2019-01-29 DIAGNOSIS — M79604 Pain in right leg: Secondary | ICD-10-CM | POA: Diagnosis not present

## 2019-02-03 DIAGNOSIS — M25552 Pain in left hip: Secondary | ICD-10-CM | POA: Diagnosis not present

## 2019-02-03 DIAGNOSIS — M79604 Pain in right leg: Secondary | ICD-10-CM | POA: Diagnosis not present

## 2019-02-03 DIAGNOSIS — M6281 Muscle weakness (generalized): Secondary | ICD-10-CM | POA: Diagnosis not present

## 2019-02-03 DIAGNOSIS — M25562 Pain in left knee: Secondary | ICD-10-CM | POA: Diagnosis not present

## 2019-02-05 DIAGNOSIS — M79604 Pain in right leg: Secondary | ICD-10-CM | POA: Diagnosis not present

## 2019-02-05 DIAGNOSIS — M6281 Muscle weakness (generalized): Secondary | ICD-10-CM | POA: Diagnosis not present

## 2019-02-05 DIAGNOSIS — M25552 Pain in left hip: Secondary | ICD-10-CM | POA: Diagnosis not present

## 2019-02-05 DIAGNOSIS — M25562 Pain in left knee: Secondary | ICD-10-CM | POA: Diagnosis not present

## 2019-02-06 DIAGNOSIS — M1612 Unilateral primary osteoarthritis, left hip: Secondary | ICD-10-CM | POA: Diagnosis not present

## 2019-02-06 DIAGNOSIS — Z96651 Presence of right artificial knee joint: Secondary | ICD-10-CM | POA: Diagnosis not present

## 2019-02-06 DIAGNOSIS — M7062 Trochanteric bursitis, left hip: Secondary | ICD-10-CM | POA: Diagnosis not present

## 2019-02-09 ENCOUNTER — Other Ambulatory Visit: Payer: Self-pay | Admitting: Family Medicine

## 2019-02-09 ENCOUNTER — Other Ambulatory Visit: Payer: Self-pay

## 2019-02-10 ENCOUNTER — Encounter: Payer: Self-pay | Admitting: Family Medicine

## 2019-02-10 ENCOUNTER — Other Ambulatory Visit: Payer: Self-pay | Admitting: *Deleted

## 2019-02-10 ENCOUNTER — Ambulatory Visit (INDEPENDENT_AMBULATORY_CARE_PROVIDER_SITE_OTHER): Payer: Medicare Other | Admitting: Family Medicine

## 2019-02-10 VITALS — BP 128/68 | HR 65 | Temp 97.6°F | Resp 18 | Ht 61.0 in | Wt 169.8 lb

## 2019-02-10 DIAGNOSIS — R600 Localized edema: Secondary | ICD-10-CM

## 2019-02-10 DIAGNOSIS — E039 Hypothyroidism, unspecified: Secondary | ICD-10-CM | POA: Diagnosis not present

## 2019-02-10 DIAGNOSIS — E785 Hyperlipidemia, unspecified: Secondary | ICD-10-CM | POA: Diagnosis not present

## 2019-02-10 DIAGNOSIS — L03119 Cellulitis of unspecified part of limb: Secondary | ICD-10-CM

## 2019-02-10 MED ORDER — LEVOTHYROXINE SODIUM 88 MCG PO TABS: 88.0000 ug | ORAL_TABLET | Freq: Every day | ORAL | 0 refills | Status: DC

## 2019-02-10 MED ORDER — SIMVASTATIN 20 MG PO TABS: 20.0000 mg | ORAL_TABLET | Freq: Every day | ORAL | 1 refills | Status: DC

## 2019-02-10 MED ORDER — FUROSEMIDE 40 MG PO TABS: 40.0000 mg | ORAL_TABLET | Freq: Every day | ORAL | 3 refills | Status: DC

## 2019-02-10 MED ORDER — CEPHALEXIN 500 MG PO CAPS: 500.0000 mg | ORAL_CAPSULE | Freq: Two times a day (BID) | ORAL | 0 refills | Status: DC

## 2019-02-10 NOTE — Assessment & Plan Note (Signed)
Elevate legs Increase lasix to 40 mg daily  con't with PT Refer to cardiology  Check echo

## 2019-02-10 NOTE — Progress Notes (Signed)
Patient ID: Joan [name], female    DOB: 1946/03/11  Age: 73 y.o. MRN: [id number]    Subjective:  Subjective  HPI Joan [name] presents for f/u edema.  It is getting worse  No sob, no cp.  The rash on her legs is back as well.  Her pt has been putting "boots on her that pump and help the swelling"  She has to pay out of pocket but she states it really helps    She has been on hctz, lasix and chlorthalidone---- they will work a little but than it comes right back  Review of Systems  Constitutional: Negative for appetite change, diaphoresis, fatigue and unexpected weight change.  Eyes: Negative for pain, redness and visual disturbance.  Respiratory: Negative for cough, chest tightness, shortness of breath and wheezing.   Cardiovascular: Positive for leg swelling. Negative for chest pain and palpitations.  Endocrine: Negative for cold intolerance, heat intolerance, polydipsia, polyphagia and polyuria.  Genitourinary: Negative for difficulty urinating, dysuria and frequency.  Skin: Positive for color change and rash.  Neurological: Negative for dizziness, light-headedness, numbness and headaches.    History Past Medical History:  Diagnosis Date  . Arthritis    "shoulders; wrist; probably spine" (06/24/2013)  . Chronic low back pain    followed by Dr Hardin Negus pain mgt  . Colon polyp   . Constipation   . Esophageal stricture   . Finger pain, left    2 fingers on left hand since wrist surgery  . GERD (gastroesophageal reflux disease)   . Heart murmur    "slight; not on RX" (06/24/2013)  . History of cardiac arrhythmia    cardiologist- Traci Turner  . Hx of colonic polyps 08/28/2004  . Hyperlipemia   . Hypertension   . Hypothyroidism   . Osteoarthritis of left knee    advanced  . PONV (postoperative nausea and vomiting)    Pt reports symptoms are the result of gallbladder and cholecystectomy, not anesthesia  . PVC's (premature ventricular contractions)   . Sleep apnea  1990's   "tested; tried mask; couldn't stand it; told me as long as I slept on my side I'd be ok" (06/24/2013)  . Thyroid nodule   . Tobacco abuse     She has a past surgical history that includes Lumbar fusion; Cesarean section (1972); Ankle surgery (Left, 1995); Infusion pump implantation (1990's); Knee arthroscopy (Left, 1991; ~ 1993); Elbow surgery (1990's); Thyroidectomy (2012); Cervical spine surgery (2012); Foot surgery (Right, 2012); Shoulder arthroscopy (Left, 09/2011); Lumbar laminectomy/decompression microdiscectomy (N/A, 11/28/2012); Back surgery; Cholecystectomy (N/A, 04/16/2013); Laparoscopic lysis of adhesions (N/A, 04/16/2013); Posterior fusion lumbar spine (06/24/2013); Abdominal hysterectomy (1988); ORIF distal radius fracture (Left, 12/30/2013); ORIF wrist fracture (Left, 12/30/2013); Robotic assisted salpingo oophorectomy (Bilateral, 08/20/2014); Abdominal hysterectomy; and Total knee arthroplasty (Right, 09/09/2017).   Her family history includes Bone cancer in her sister; Breast cancer in her maternal aunt and sister; Cancer in her father; Coronary artery disease (age of onset: 83) in her mother; Diabetes in her mother and sister; Diabetes type II in her mother; Hypertension in her brother, mother, and sister; Pancreatic cancer in her father; Skin cancer in her mother; Thyroid cancer in her mother.She reports that she quit smoking about 3 years ago. Her smoking use included cigarettes. She has a 35.00 pack-year smoking history. She has never used smokeless tobacco. She reports that she does not drink alcohol or use drugs.  Current Outpatient Medications on File Prior to Visit  Medication Sig Dispense Refill  .  bisacodyl (DULCOLAX) 5 MG EC tablet Take 5 mg by mouth at bedtime.  30 tablet   . carisoprodol (SOMA) 350 MG tablet Take 175 mg by mouth 3 (three) times daily. For muscle spasms    . diclofenac sodium (VOLTAREN) 1 % GEL Apply 4 g topically 4 (four) times daily.  2  .  dorzolamide-timolol (COSOPT) 22.3-6.8 MG/ML ophthalmic solution Place 1 drop into the left eye 2 (two) times daily.    Marland Kitchen estradiol (ESTRACE) 0.5 MG tablet Take 0.5 mg by mouth daily.  4  . fenofibrate 160 MG tablet TAKE 1 TABLET BY MOUTH ONCE DAILY 90 tablet 1  . HYDROcodone-acetaminophen (NORCO) 10-325 MG tablet Take 1 tablet by mouth every 4 (four) hours as needed.  0  . potassium chloride SA (K-DUR,KLOR-CON) 20 MEQ tablet TAKE 3 TABLETS BY MOUTH  DAILY 270 tablet 1  . traZODone (DESYREL) 50 MG tablet Take 1 tablet (50 mg total) by mouth at bedtime. 90 tablet 1   No current facility-administered medications on file prior to visit.      Objective:  Objective  Physical Exam Vitals signs and nursing note reviewed.  Constitutional:      Appearance: She is well-developed.  HENT:     Head: Normocephalic and atraumatic.  Eyes:     Conjunctiva/sclera: Conjunctivae normal.  Neck:     Musculoskeletal: Normal range of motion and neck supple.     Thyroid: No thyromegaly.     Vascular: No carotid bruit or JVD.  Cardiovascular:     Rate and Rhythm: Normal rate and regular rhythm.     Heart sounds: Normal heart sounds. No murmur.  Pulmonary:     Effort: Pulmonary effort is normal. No respiratory distress.     Breath sounds: Normal breath sounds. No wheezing or rales.  Chest:     Chest wall: No tenderness.  Musculoskeletal:        General: Swelling present. No tenderness.     Right lower leg: Edema present.     Left lower leg: Edema present.  Skin:    Findings: Ecchymosis and rash present. Rash is papular and vesicular.       Neurological:     Mental Status: She is alert and oriented to person, place, and time.    BP 128/68 (BP Location: Left Arm, Patient Position: Sitting, Cuff Size: Normal)   Pulse 65   Temp 97.6 F (36.4 C) (Oral)   Resp 18   Ht 5\' 1"  (1.549 m)   Wt 169 lb 12.8 oz (77 kg)   SpO2 97%   BMI 32.08 kg/m  Wt Readings from Last 3 Encounters:  02/10/19 169 lb  12.8 oz (77 kg)  05/29/18 159 lb 12.8 oz (72.5 kg)  02/25/18 155 lb 12.8 oz (70.7 kg)     Lab Results  Component Value Date   WBC 7.8 09/11/2017   HGB 9.5 (L) 09/11/2017   HCT 27.9 (L) 09/11/2017   PLT 197 09/11/2017   GLUCOSE 110 (H) 05/29/2018   CHOL 142 05/29/2018   TRIG 66.0 05/29/2018   HDL 59.90 05/29/2018   LDLDIRECT 106.0 11/29/2015   LDLCALC 69 05/29/2018   ALT 7 05/29/2018   AST 15 05/29/2018   NA 138 05/29/2018   K 3.3 (L) 05/29/2018   CL 100 05/29/2018   CREATININE 1.00 05/29/2018   BUN 22 05/29/2018   CO2 31 05/29/2018   TSH 3.59 05/29/2018   INR 0.97 09/03/2017   HGBA1C 6.0 12/30/2017    US  Arterial Abi (screening Lower Extremity)  Result Date: 06/18/2018 CLINICAL DATA:  73 year old female with lower extremity edema EXAM: NONINVASIVE PHYSIOLOGIC VASCULAR STUDY OF BILATERAL LOWER EXTREMITIES TECHNIQUE: Evaluation of both lower extremities were performed at rest, including calculation of ankle-brachial indices with single level Doppler, pressure and pulse volume recording. COMPARISON:  None. FINDINGS: Right ABI:  1.23 Left ABI:  1.22 Right Lower Extremity:  Normal arterial waveforms at the ankle. Left Lower Extremity:  Normal arterial waveforms at the ankle. 1.0-1.4 Normal IMPRESSION: Normal examination. No evidence of hemodynamically significant peripheral arterial disease. Signed, Criselda Peaches, MD, [facility] Radiology Electronically Signed   By: Jacqulynn Cadet M.D.   On: 06/18/2018 12:30     Assessment & Plan:  Plan  I have discontinued Ryan Palermo. Savarese's hydrochlorothiazide and furosemide. I am also having her start on furosemide. Additionally, I am having her maintain her carisoprodol, bisacodyl, dorzolamide-timolol, diclofenac sodium, estradiol, HYDROcodone-acetaminophen, potassium chloride SA, fenofibrate, traZODone, levothyroxine, simvastatin, and cephALEXin.  Meds ordered this encounter   Medications  . furosemide (LASIX) 40 MG tablet    Sig: Take 1 tablet (40 mg total) by mouth daily.    Dispense:  30 tablet    Refill:  3  . cephALEXin (KEFLEX) 500 MG capsule    Sig: Take 1 capsule (500 mg total) by mouth 2 (two) times daily.    Dispense:  20 capsule    Refill:  0    Problem List Items Addressed This Visit      Unprioritized   Hyperlipidemia    Tolerating statin, encouraged heart healthy diet, avoid trans fats, minimize simple carbs and saturated fats. Increase exercise as tolerated      Relevant Medications   furosemide (LASIX) 40 MG tablet   Other Relevant Orders   Lipid panel   Comprehensive metabolic panel   Hypothyroidism   Relevant Orders   TSH    Other Visit Diagnoses    Lower extremity edema    -  Primary   Relevant Medications   furosemide (LASIX) 40 MG tablet   Other Relevant Orders   ECHOCARDIOGRAM COMPLETE   Ambulatory referral to Cardiology   Comprehensive metabolic panel   Cellulitis of lower extremity, unspecified laterality       Relevant Medications   cephALEXin (KEFLEX) 500 MG capsule      Follow-up: Return in about 6 months (around 08/13/2019), or if symptoms worsen or fail to improve.  [name], DO

## 2019-02-10 NOTE — Assessment & Plan Note (Signed)
Tolerating statin, encouraged heart healthy diet, avoid trans fats, minimize simple carbs and saturated fats. Increase exercise as tolerated 

## 2019-02-11 LAB — LIPID PANEL
Cholesterol: 157 mg/dL (ref 0–200)
HDL: 55.6 mg/dL (ref >39.00)
LDL Cholesterol: 79 mg/dL (ref 0–99)
NonHDL: 101.11
Total CHOL/HDL Ratio: 3
Triglycerides: 113 mg/dL (ref 0.0–149.0)
VLDL: 22.6 mg/dL (ref 0.0–40.0)

## 2019-02-11 LAB — COMPREHENSIVE METABOLIC PANEL
ALT: 8 U/L (ref 0–35)
AST: 15 U/L (ref 0–37)
Albumin: 4.3 g/dL (ref 3.5–5.2)
Alkaline Phosphatase: 43 U/L (ref 39–117)
BUN: 18 mg/dL (ref 6–23)
CO2: 31 mEq/L (ref 19–32)
Calcium: 9 mg/dL (ref 8.4–10.5)
Chloride: 101 mEq/L (ref 96–112)
Creatinine, Ser: 1.04 mg/dL (ref 0.40–1.20)
GFR: 51.76 mL/min — ABNORMAL LOW (ref >60.00)
Glucose, Bld: 104 mg/dL — ABNORMAL HIGH (ref 70–99)
Potassium: 3.7 mEq/L (ref 3.5–5.1)
Sodium: 139 mEq/L (ref 135–145)
Total Bilirubin: 0.4 mg/dL (ref 0.2–1.2)
Total Protein: 6.4 g/dL (ref 6.0–8.3)

## 2019-02-11 LAB — TSH: TSH: 4.23 u[IU]/mL (ref 0.35–4.50)

## 2019-02-17 ENCOUNTER — Other Ambulatory Visit: Payer: Self-pay

## 2019-02-17 ENCOUNTER — Ambulatory Visit (HOSPITAL_COMMUNITY): Payer: Medicare Other | Attending: Cardiology

## 2019-02-17 DIAGNOSIS — R600 Localized edema: Secondary | ICD-10-CM | POA: Diagnosis not present

## 2019-03-03 DIAGNOSIS — M1612 Unilateral primary osteoarthritis, left hip: Secondary | ICD-10-CM | POA: Diagnosis not present

## 2019-03-10 ENCOUNTER — Other Ambulatory Visit: Payer: Self-pay | Admitting: Family Medicine

## 2019-03-10 DIAGNOSIS — E785 Hyperlipidemia, unspecified: Secondary | ICD-10-CM

## 2019-03-12 ENCOUNTER — Encounter: Payer: Self-pay | Admitting: Cardiovascular Disease

## 2019-03-12 ENCOUNTER — Ambulatory Visit (HOSPITAL_COMMUNITY)
Admission: RE | Admit: 2019-03-12 | Discharge: 2019-03-12 | Disposition: A | Discharge status: Home / Self Care | Payer: Medicare Other | Source: Ambulatory Visit | Attending: Cardiovascular Disease | Admitting: Cardiovascular Disease

## 2019-03-12 ENCOUNTER — Other Ambulatory Visit: Payer: Self-pay

## 2019-03-12 ENCOUNTER — Ambulatory Visit (INDEPENDENT_AMBULATORY_CARE_PROVIDER_SITE_OTHER): Payer: Medicare Other | Admitting: Cardiovascular Disease

## 2019-03-12 VITALS — BP 98/70 | HR 74 | Temp 97.1°F | Ht 60.0 in | Wt 165.0 lb

## 2019-03-12 DIAGNOSIS — I872 Venous insufficiency (chronic) (peripheral): Secondary | ICD-10-CM | POA: Diagnosis not present

## 2019-03-12 DIAGNOSIS — R0602 Shortness of breath: Secondary | ICD-10-CM

## 2019-03-12 DIAGNOSIS — Z01810 Encounter for preprocedural cardiovascular examination: Secondary | ICD-10-CM

## 2019-03-12 DIAGNOSIS — R600 Localized edema: Secondary | ICD-10-CM

## 2019-03-12 DIAGNOSIS — F17200 Nicotine dependence, unspecified, uncomplicated: Secondary | ICD-10-CM

## 2019-03-12 NOTE — Patient Instructions (Signed)
Medication Instructions:  Continue same medications   Lab work: BNP today   Testing/Procedures: Schedule lower ext venous dopplers  Follow-Up: At [facility], you and your health needs are our priority.  As part of our continuing mission to provide you with exceptional heart care, we have created designated Provider Care Teams.  These Care Teams include your primary Cardiologist (physician) and Advanced Practice Providers (APPs -  Physician Assistants and Nurse Practitioners) who all work together to provide you with the care you need, when you need it. . Schedule follow up appointment in 6 months   Call in Dec to schedule March appointment

## 2019-03-12 NOTE — Progress Notes (Addendum)
Cardiology Office Note:   Date:  03/12/2019  NAME:  Joan [name]    MRN: [id number] DOB:  06/15/1946   PCP:  [name], DO  Cardiologist:  Fransico Him, MD  Electrophysiologist:  None   Referring MD: Carollee Herter, Alferd Apa, *   Chief Complaint  Patient presents with   Leg Swelling   History of Present Illness:   Joan [name] is a 73 y.o. female with a hx of hypertension, tobacco abuse, arthritis who is being seen today for the evaluation of lower extremity edema at the request of [name], DO.  She reports for the past 3 to 4 months has had worsening lower extremity edema.  She is also had open wounds on her lower extremities bilaterally.  She reports this occurred after her most recent knee surgery in March of this year.  She reports a period of immobility and worsening lower extremity edema.  She does report that the left lower extremity swells more than the right.  Her primary care physician ordered an echocardiogram which showed on my review a low normal ejection fraction of 50 to 55%.  It was reviewed as mildly reduced at 4 5 to 50%.  She has been taking Lasix with improvement in her lower extreme edema.  She has tried compression stockings and to elevate them as much as possible.  She is very limited by multiple knee and hip surgeries it appears.  She states she is planning for a left hip surgery here in the near future.  Review of her results shows a normal nuclear medicine stress test in 2018 that was performed prior to her most recent surgery.  She reports no real change in her symptoms, and has the same functional capacity as previously.  She does get short of breath and winded with exercise, but this appears to be stable.  She reports no chest pressure or chest pain with exertion.  She is mainly limited to activity around her house.  She smokes 1/2 packs/day and has done so for nearly 50+ years.  She has no history of hypertension.  Lab work reveals LDL  cholesterol 79, A1c 6.0, TSH 4.2.  Past Medical History: Past Medical History:  Diagnosis Date   Arthritis    "shoulders; wrist; probably spine" (06/24/2013)   Chronic low back pain    followed by Dr Hardin Negus pain mgt   Colon polyp    Constipation    Esophageal stricture    Finger pain, left    2 fingers on left hand since wrist surgery   GERD (gastroesophageal reflux disease)    Heart murmur    "slight; not on RX" (06/24/2013)   History of cardiac arrhythmia    cardiologist- Traci Turner   Hx of colonic polyps 08/28/2004   Hyperlipemia    Hypertension    Hypothyroidism    Osteoarthritis of left knee    advanced   PONV (postoperative nausea and vomiting)    Pt reports symptoms are the result of gallbladder and cholecystectomy, not anesthesia   PVC's (premature ventricular contractions)    Sleep apnea 1990's   "tested; tried mask; couldn't stand it; told me as long as I slept on my side I'd be ok" (06/24/2013)   Thyroid nodule    Tobacco abuse     Past Surgical History: Past Surgical History:  Procedure Laterality Date   ABDOMINAL HYSTERECTOMY  1988   "partial" (06/24/2013)   ABDOMINAL HYSTERECTOMY  partial in [place]   "tendon repair" (06/24/2013)   BACK SURGERY     "think today was my 8th back OR" (06/24/2013)   CERVICAL SPINE SURGERY  2012   CESAREAN SECTION  1972   CHOLECYSTECTOMY N/A 04/16/2013   Procedure: LAPAROSCOPIC CHOLECYSTECTOMY ;  Surgeon: Imogene Burn. Tsuei, MD;  Location: [facility];  Service: General;  Laterality: N/A;   ELBOW SURGERY  1990's   FOOT SURGERY Right 2012   SPUR REMOVED   INFUSION PUMP IMPLANTATION  1990's   "implantablet morphine pump; took it out w/in 11 months   KNEE ARTHROSCOPY Left 1991; ~ [place] N/A 04/16/2013   Procedure: LAPAROSCOPIC LYSIS OF ADHESIONS;  Surgeon: Imogene Burn. Georgette Dover, MD;  Location: [facility];  Service: General;  Laterality: N/A;    LUMBAR FUSION     and rods   LUMBAR LAMINECTOMY/DECOMPRESSION MICRODISCECTOMY N/A 11/28/2012   Procedure: DECOMPRESSIVE LUMBAR LAMINECTOMY LEVEL 1;  Surgeon: Elaina Hoops, MD;  Location: [facility];  Service: Neurosurgery;  Laterality: N/A;  DECOMPRESSIVE LUMBAR LAMINECTOMY LEVEL 1   ORIF DISTAL RADIUS FRACTURE Left 12/30/2013   dr Caralyn Guile   ORIF WRIST FRACTURE Left 12/30/2013   Procedure: OPEN REDUCTION INTERNAL FIXATION (ORIF) LEFT WRIST FRACTURE AND REPAIR AS INDICATED;  Surgeon: Linna Hoff, MD;  Location: [facility];  Service: Orthopedics;  Laterality: Left;   POSTERIOR FUSION LUMBAR SPINE  06/24/2013   ROBOTIC ASSISTED SALPINGO OOPHERECTOMY Bilateral 08/20/2014   Procedure: ROBOTIC ASSISTED SALPINGO OOPHORECTOMY;  Surgeon: Daria Pastures, MD;  Location: [facility];  Service: Gynecology;  Laterality: Bilateral;   SHOULDER ARTHROSCOPY Left 09/2011   THYROIDECTOMY  2012   TOTAL KNEE ARTHROPLASTY Right 09/09/2017   Procedure: RIGHT TOTAL KNEE ARTHROPLASTY;  Surgeon: Gaynelle Arabian, MD;  Location: [facility];  Service: Orthopedics;  Laterality: Right;    Current Medications: Current Meds  Medication Sig   bisacodyl (DULCOLAX) 5 MG EC tablet Take 5 mg by mouth at bedtime.    carisoprodol (SOMA) 350 MG tablet Take 175 mg by mouth 3 (three) times daily. For muscle spasms   dorzolamide-timolol (COSOPT) 22.3-6.8 MG/ML ophthalmic solution Place 1 drop into the left eye 2 (two) times daily.   estradiol (ESTRACE) 0.5 MG tablet Take 0.5 mg by mouth daily.   fenofibrate 160 MG tablet Take 1 tablet by mouth once daily   furosemide (LASIX) 40 MG tablet Take 1 tablet (40 mg total) by mouth daily.   hydrochlorothiazide (HYDRODIURIL) 25 MG tablet hydrochlorothiazide 25 mg tablet   HYDROcodone-acetaminophen (NORCO) 10-325 MG tablet Take 1 tablet by mouth every 4 (four) hours as needed.   levothyroxine (SYNTHROID) 88 MCG tablet Take 1 tablet (88 mcg total) by mouth daily before breakfast.    potassium chloride SA (K-DUR) 20 MEQ tablet TAKE 3 TABLETS BY MOUTH  DAILY   simvastatin (ZOCOR) 20 MG tablet Take 1 tablet (20 mg total) by mouth at bedtime.   traZODone (DESYREL) 50 MG tablet Take 1 tablet (50 mg total) by mouth at bedtime.     Allergies:    Morphine and related, 5-alpha reductase inhibitors, Amitriptyline, Gabapentin, and Triamterene   Social History: Social History   Socioeconomic History   Marital status: Divorced    Spouse name: Not on file   Number of children: Not on file   Years of education: Not on file   Highest education level: Not on file  Occupational History   Not on file  Social  Needs   Financial resource strain: Not on file   Food insecurity    Worry: Not on file    Inability: Not on file   Transportation needs    Medical: Not on file    Non-medical: Not on file  Tobacco Use   Smoking status: Former Smoker    Packs/day: 1.50    Years: 50.00    Pack years: 75.00    Types: Cigarettes    Quit date: 11/22/2015    Years since quitting: 3.3   Smokeless tobacco: Never Used  Substance and Sexual Activity   Alcohol use: No    Alcohol/week: 0.0 standard drinks   Drug use: No   Sexual activity: Not Currently  Lifestyle   Physical activity    Days per week: Not on file    Minutes per session: Not on file   Stress: Not on file  Relationships   Social connections    Talks on phone: Not on file    Gets together: Not on file    Attends religious service: Not on file    Active member of club or organization: Not on file    Attends meetings of clubs or organizations: Not on file    Relationship status: Not on file  Other Topics Concern   Not on file  Social History Narrative   Divorced   Current Smoker  1 ppd -  20 yrs      Alcohol use-no       [employer] - laid off       Physician roster:   Dr. Elta Guadeloupe Philips - pain management   Dr. Philis Pique - GYN   Dr. Saintclair Halsted - Neurosurgery   Dr. Ronnald Ramp - dermatology    Dr. Caralyn Guile - orthopedics   Dr. Fransico Him - cardiology   Dr. Miller-ophthalmology     Family History: The patient's family history includes Bone cancer in her sister; Breast cancer in her maternal aunt and sister; Cancer in her father; Coronary artery disease (age of onset: 26) in her mother; Diabetes in her mother and sister; Diabetes type II in her mother; Hypertension in her brother, mother, and sister; Pancreatic cancer in her father; Skin cancer in her mother; Thyroid cancer in her mother. There is no history of Other.  ROS:   All other ROS reviewed and negative. Pertinent positives noted in the HPI.     EKGs/Labs/Other Studies Reviewed:   The following studies were personally reviewed by me today: A1c 6.0, total cholesterol 157, LDL cholesterol 79, HDL 56, triglycerides 113, hemoglobin 9.5, creatinine 1.04, TSH 4.23  EKG:  EKG is ordered today.  The ekg ordered today demonstrates normal sinus rhythm, heart rate 64, normal intervals, nonspecific ST-T wave abnormalities, no evidence of prior infarction, and was personally reviewed by me.   Recent Labs: 02/10/2019: ALT 8; BUN 18; Creatinine, Ser 1.04; Potassium 3.7; Sodium 139; TSH 4.23   Recent Lipid Panel    Component Value Date/Time   CHOL 157 02/10/2019 1610   TRIG 113.0 02/10/2019 1610   HDL 55.60 02/10/2019 1610   CHOLHDL 3 02/10/2019 1610   VLDL 22.6 02/10/2019 1610   LDLCALC 79 02/10/2019 1610   LDLDIRECT 106.0 11/29/2015 1515    Physical Exam:   VS:  BP 98/70    Pulse 74    Temp (!) 97.1 F (36.2 C)    Ht 5' (1.524 m)    Wt 165 lb (74.8 kg)    SpO2 97%    BMI  32.22 kg/m    Wt Readings from Last 3 Encounters:  03/12/19 165 lb (74.8 kg)  02/10/19 169 lb 12.8 oz (77 kg)  05/29/18 159 lb 12.8 oz (72.5 kg)    General: Well nourished, well developed, in no acute distress Heart: Atraumatic, normal size  Eyes: PEERLA, EOMI  Neck: Supple, no JVD Endocrine: No thryomegaly Cardiac: Normal S1, S2; RRR; no murmurs, rubs,  or gallops Lungs: Clear to auscultation bilaterally, no wheezing, rhonchi or rales  Abd: Soft, nontender, no hepatomegaly  Ext: Lower extremities with changes classic for venous insufficiency she has no open ulcers or infection today, she has trace edema in her feet Musculoskeletal: No deformities, BUE and BLE strength normal and equal Skin: Warm and dry, no rashes   Neuro: Alert and oriented to person, place, time, and situation, CNII-XII grossly intact, no focal deficits  Psych: Normal mood and affect   ASSESSMENT:   NAME@ is a 73 y.o. female who presents for the following: 1. Leg edema   2. Venous insufficiency   3. SOB (shortness of breath) on exertion   4. TOBACCO ABUSE   5. Preop cardiovascular exam     PLAN:   1. Leg edema 2. Venous insufficiency -She has evidence of venous insufficiency, is likely explanation for her lower extremity edema.  She has no elevated neck veins or findings consistent with clinical heart failure.  On review of her echocardiogram the EF is likely low normal 50 to 55%, and this requires no treatment.  Her EKG is normal without any prior infarction.  She also had a recent nuclear medicine stress test in 2018 that was normal.  She is had recent arterial duplexes performed in [place], that showed no significant PAD. -I think it is reasonable given her immobility to exclude DVT is a possibility, we will obtain bilateral venous duplexes to exclude this -In the interim she will pursue supportive care including leg elevation, her Lasix, and her compression stockings that she is able to tolerate them.  I have also encouraged her to increase her mobility as this will help  3. SOB (shortness of breath) on exertion -This appears to be stable consistent with her everyday life.  She has had a recent nuclear medicine stress test in 2010 that was normal.  I do not suspect she will need another stress test at this time.  We will obtain a BNP to further exclude the  likelihood of congestive heart failure as etiology for symptoms.  She was encouraged to stop smoking as this will not help.  I suspect she has underlying COPD.  4. TOBACCO ABUSE -Counseled on the importance of smoking cessation  5. Preop Exam -We will obtain the venous ultrasound as described above to exclude DVT.  She has had normal ABIs.  She had a recent nuclear medicine stress test in 2010 that was normal.  Her most recent echocardiogram shows an ejection fraction of likely 50 to 55%.  She also reports no real significant change in her shortness of breath or activity level.  I think at this time I would not recommend further testing other than the above test we have plan prior to surgery.  If her venous duplex looks okay we will let her go to surgery. Her orthopedic surgeon is Hector Shade, MD at [facility] and we will send him our report    Disposition: Return in about 6 months (around 09/09/2019).  Medication Adjustments/Labs and Tests Ordered: Current medicines are reviewed at length with the  patient today.  Concerns regarding medicines are outlined above.  Orders Placed This Encounter  Procedures   B Nat Peptide   EKG 12-Lead   VAS [place] LOWER EXTREMITY VENOUS (DVT)   No orders of the defined types were placed in this encounter.   Patient Instructions  Medication Instructions:  Continue same medications   Lab work: BNP today   Testing/Procedures: Schedule lower ext venous dopplers  Follow-Up: At [facility], you and your health needs are our priority.  As part of our continuing mission to provide you with exceptional heart care, we have created designated Provider Care Teams.  These Care Teams include your primary Cardiologist (physician) and Advanced Practice Providers (APPs -  Physician Assistants and Nurse Practitioners) who all work together to provide you with the care you need, when you need it.  Schedule follow up appointment in 6 months   Call in Dec to  schedule March appointment       Signed, Lake Bells T. Audie Box, [place]  [address] [place], [place] [address] [phone]  03/12/2019 2:19 PM

## 2019-03-13 ENCOUNTER — Encounter: Payer: Self-pay | Admitting: Cardiovascular Disease

## 2019-03-13 LAB — BRAIN NATRIURETIC PEPTIDE: BNP: 13 pg/mL (ref 0.0–100.0)

## 2019-03-19 DIAGNOSIS — M961 Postlaminectomy syndrome, not elsewhere classified: Secondary | ICD-10-CM | POA: Diagnosis not present

## 2019-03-19 DIAGNOSIS — M25552 Pain in left hip: Secondary | ICD-10-CM | POA: Diagnosis not present

## 2019-03-19 DIAGNOSIS — M25551 Pain in right hip: Secondary | ICD-10-CM | POA: Diagnosis not present

## 2019-03-19 DIAGNOSIS — G5702 Lesion of sciatic nerve, left lower limb: Secondary | ICD-10-CM | POA: Diagnosis not present

## 2019-03-26 DIAGNOSIS — M1612 Unilateral primary osteoarthritis, left hip: Secondary | ICD-10-CM | POA: Diagnosis not present

## 2019-03-31 ENCOUNTER — Other Ambulatory Visit: Payer: Self-pay | Admitting: Family Medicine

## 2019-04-03 ENCOUNTER — Other Ambulatory Visit: Payer: Self-pay

## 2019-04-03 ENCOUNTER — Encounter: Payer: Self-pay | Admitting: Family Medicine

## 2019-04-03 ENCOUNTER — Ambulatory Visit (INDEPENDENT_AMBULATORY_CARE_PROVIDER_SITE_OTHER): Payer: Medicare Other | Admitting: Family Medicine

## 2019-04-03 VITALS — BP 118/78 | HR 72 | Temp 97.4°F | Resp 18 | Ht 60.0 in | Wt 170.0 lb

## 2019-04-03 DIAGNOSIS — Z23 Encounter for immunization: Secondary | ICD-10-CM | POA: Diagnosis not present

## 2019-04-03 DIAGNOSIS — Z01818 Encounter for other preprocedural examination: Secondary | ICD-10-CM | POA: Diagnosis not present

## 2019-04-03 NOTE — Patient Instructions (Signed)
Preparing for Hip Replacement Getting prepared before hip replacement surgery can make your recovery easier and more comfortable. This document provides some tips and guidelines that will help you prepare for your surgery. Talk with your health care provider so you can learn what to expect before, during, and after surgery. Ask questions if you do not understand something. To ease concerns about your financial responsibilities, call your insurance company as soon as you decide to have surgery. Ask how much of your surgery and hospital stay will be covered. Also ask about coverage for medical equipment, rehabilitation facilities, and home care. How should I arrange for help? In the first couple weeks after surgery, it will likely be harder for you to do some of your regular activities. You may get tired easily, and you may have limited movement in your leg. Follow these guidelines to make sure you have all the help you need after your surgery:  Plan to have someone take you home from the hospital. Your health care provider will tell you how many days you can expect to be in the hospital.  Cancel all your work, caregiving, and volunteer responsibilities for at least 4-6 weeks after surgery.  Plan to have someone stay with you day and night for the first week. This person should be someone you are comfortable with. You may need this person to help you with your exercises and personal care, such as bathing and using the toilet.  If you live alone, arrange for someone to take care of your home and pets for the first 4-6 weeks after surgery.  Arrange for drivers to take you to and from follow-up appointments, the grocery store, and other places you may need to go for at least 4-6 weeks.  Consider applying for a disabled parking permit. To get an application, contact the Department of Motor Vehicles or your health care provider's office. How should I prepare my home?      Pick a recovery spot, but do  not plan on recovering in bed. Sitting more upright is better for your health. You may want to use a recliner with a small table nearby. Choose a chair with a firm seat that will not allow you to sink down into it. Chairs and sofas that are too soft can allow your hip to bend at an angle greater than 90 degrees. This could put you at risk for dislocating your new hip joint.  Place the items you use most frequently on the small table next to your chair. These items may include the TV remote, a cordless phone, a cell phone, a book or laptop computer, and a water glass.  To see if you will be able to move around in your home with a walker, hold your hands out about 6 inches (15 cm) from your sides, and walk from your recovery spot to your kitchen and bathroom. Then walk from your bed to the bathroom. If you do not hit anything with your hands, you will have enough room for a walker.  Minimize the use of stairs after you return home to reduce your risk of falling or tripping.  Remove all clutter from your floors. Also remove any throw rugs. This will help you avoid tripping after your surgery.  Move the items you use most often in your kitchen, bathroom, and bedroom to shelves and drawers that are at countertop height.  Prepare a few meals to freeze and reheat later.  Consider getting safety equipment that will be  helpful during your recovery, such as: ? Grab bars added in the shower and near the toilet. ? A raised toilet seat to help you get on and off the toilet more easily. ? A tub or shower bench. How should I prepare my body?  Have a preoperative exam. ? During the exam, your health care provider will make sure that your body is healthy enough to safely have the surgery. ? When you go to the exam, bring a complete list of all your medicines and supplements, including herbs and vitamins. ? You may need to have additional tests to ensure your safety.  Have elective dental care and routine  cleanings done before your surgery. Germs from anywhere in your body, including your mouth, can travel to your new joint and infect it. It is important that you do not have any dental work done for at least 3 months after your surgery.  Maintain a healthy diet. Do not change your diet before surgery unless your health care provider tells you to do that.  Do not use any products that contain nicotine or tobacco, such as cigarettes and e-cigarettes. These can delay bone healing after surgery. If you need help quitting, ask your health care provider.  Tell your health care provider if: ? You develop any skin infections or skin irritations. You may need to improve the condition of your skin before surgery. ? You have a fever, a cold, or any other illness in the week before your surgery.  Do not drink any alcohol for at least 48 hours before surgery.  The day before your surgery, follow instructions from your health care provider about showering, eating, drinking, and taking medicines. These directions are for your safety.  Talk to your health care provider about doing exercises before your surgery. ? Be sure to follow the exercise program only as directed by your health care provider. ? Doing these exercises in the weeks before your surgery may help reduce pain and improve function after surgery. Summary  Getting prepared before hip replacement surgery can make your recovery easier and more comfortable.  Prepare your home and arrange for help at home.  Keep all of your preoperative appointments to ensure that you are ready for your surgery.  Plan to have someone take you home from the hospital and stay with you day and night for the first week. This information is not intended to replace advice given to you by your health care provider. Make sure you discuss any questions you have with your health care provider. Document Released: 09/29/2010 Document Revised: 04/13/2018 Document Reviewed:  08/12/2017 Elsevier Patient Education  2020 [employer].

## 2019-04-03 NOTE — Progress Notes (Signed)
Subjective:    Joan [name] is a 73 y.o. female who presents to the office today for a preoperative consultation at the request of surgeon dr Wynelle Link  who plans on performing left total hip on October 19. This consultation is requested for the specific conditions prompting preoperative evaluation (i.e. because of potential affect on operative risk): low. Planned anesthesia: general. The patient has the following known anesthesia issues: no hx of trouble. Patients bleeding risk: no recent abnormal bleeding. Patient does not have objections to receiving blood products if needed.  The following portions of the patient's history were reviewed and updated as appropriate:  She  has a past medical history of Arthritis, Chronic low back pain, Colon polyp, Constipation, Esophageal stricture, Finger pain, left, GERD (gastroesophageal reflux disease), Heart murmur, History of cardiac arrhythmia, colonic polyps (08/28/2004), Hyperlipemia, Hypertension, Hypothyroidism, Osteoarthritis of left knee, PONV (postoperative nausea and vomiting), PVC's (premature ventricular contractions), Sleep apnea (1990's), Thyroid nodule, and Tobacco abuse. She does not have any pertinent problems on file. She  has a past surgical history that includes Lumbar fusion; Cesarean section (1972); Ankle surgery (Left, 1995); Infusion pump implantation (1990's); Knee arthroscopy (Left, 1991; ~ 1993); Elbow surgery (1990's); Thyroidectomy (2012); Cervical spine surgery (2012); Foot surgery (Right, 2012); Shoulder arthroscopy (Left, 09/2011); Lumbar laminectomy/decompression microdiscectomy (N/A, 11/28/2012); Back surgery; Cholecystectomy (N/A, 04/16/2013); Laparoscopic lysis of adhesions (N/A, 04/16/2013); Posterior fusion lumbar spine (06/24/2013); Abdominal hysterectomy (1988); ORIF distal radius fracture (Left, 12/30/2013); ORIF wrist fracture (Left, 12/30/2013); Robotic assisted salpingo oophorectomy (Bilateral, 08/20/2014); Abdominal  hysterectomy; and Total knee arthroplasty (Right, 09/09/2017). Her family history includes Bone cancer in her sister; Breast cancer in her maternal aunt and sister; Cancer in her father; Coronary artery disease (age of onset: 23) in her mother; Diabetes in her mother and sister; Diabetes type II in her mother; Hypertension in her brother, mother, and sister; Pancreatic cancer in her father; Skin cancer in her mother; Thyroid cancer in her mother. She  reports that she quit smoking about 3 years ago. Her smoking use included cigarettes. She has a 75.00 pack-year smoking history. She has never used smokeless tobacco. She reports that she does not drink alcohol or use drugs. She has a current medication list which includes the following prescription(s): bisacodyl, carisoprodol, dorzolamide-timolol, estradiol, fenofibrate, furosemide, hydrochlorothiazide, levothyroxine, oxycodone-acetaminophen, potassium chloride sa, simvastatin, trazodone, and hydrocodone-acetaminophen. Current Outpatient Medications on File Prior to Visit  Medication Sig Dispense Refill  . bisacodyl (DULCOLAX) 5 MG EC tablet Take 5 mg by mouth at bedtime.  30 tablet   . carisoprodol (SOMA) 350 MG tablet Take 175 mg by mouth 3 (three) times daily. For muscle spasms    . dorzolamide-timolol (COSOPT) 22.3-6.8 MG/ML ophthalmic solution Place 1 drop into the left eye 2 (two) times daily.    Marland Kitchen estradiol (ESTRACE) 0.5 MG tablet Take 0.5 mg by mouth daily.  4  . fenofibrate 160 MG tablet Take 1 tablet by mouth once daily 90 tablet 1  . furosemide (LASIX) 40 MG tablet Take 1 tablet (40 mg total) by mouth daily. 30 tablet 3  . hydrochlorothiazide (HYDRODIURIL) 25 MG tablet hydrochlorothiazide 25 mg tablet    . levothyroxine (SYNTHROID) 88 MCG tablet TAKE 1 TABLET BY MOUTH  DAILY BEFORE BREAKFAST 90 tablet 3  . oxyCODONE-acetaminophen (PERCOCET) 10-325 MG tablet Take 1 tablet by mouth every 4 (four) hours as needed. for pain    . potassium chloride  SA (K-DUR) 20 MEQ tablet TAKE 3 TABLETS BY MOUTH  DAILY 270 tablet 3  .  simvastatin (ZOCOR) 20 MG tablet Take 1 tablet (20 mg total) by mouth at bedtime. 90 tablet 1  . traZODone (DESYREL) 50 MG tablet Take 1 tablet (50 mg total) by mouth at bedtime. 90 tablet 1  . HYDROcodone-acetaminophen (NORCO) 10-325 MG tablet Take 1 tablet by mouth every 4 (four) hours as needed.  0   No current facility-administered medications on file prior to visit.    She is allergic to morphine and related; 5-alpha reductase inhibitors; amitriptyline; gabapentin; and triamterene..  Review of Systems Review of Systems  Constitutional: Negative for activity change, appetite change and fatigue.  HENT: Negative for hearing loss, congestion, tinnitus and ear discharge.  dentist q60m Eyes: Negative for visual disturbance (see optho q1y -- vision corrected to 20/20 with glasses).  Respiratory: Negative for cough, chest tightness and shortness of breath.   Cardiovascular: Negative for chest pain, palpitations and leg swelling.  Gastrointestinal: Negative for abdominal pain, diarrhea, constipation and abdominal distention.  Genitourinary: Negative for urgency, frequency, decreased urine volume and difficulty urinating.  Musculoskeletal: Negative for back pain, arthralgias and gait problem.  Skin: Negative for color change, pallor and rash.  Neurological: Negative for dizziness, light-headedness, numbness and headaches.  Hematological: Negative for adenopathy. Does not bruise/bleed easily.  Psychiatric/Behavioral: Negative for suicidal ideas, confusion, sleep disturbance, self-injury, dysphoric mood, decreased concentration and agitation.        Objective:    BP 118/78 (BP Location: Left Arm, Patient Position: Sitting, Cuff Size: Normal)   Pulse 72   Temp (!) 97.4 F (36.3 C) (Temporal)   Resp 18   Ht 5' (1.524 m)   Wt 170 lb (77.1 kg)   SpO2 98%   BMI 33.20 kg/m   General Appearance:    Alert, cooperative,  no distress, appears stated age  Head:    Normocephalic, without obvious abnormality, atraumatic  Eyes:    PERRL, conjunctiva/corneas clear, EOM's intact, fundi    benign, both eyes  Ears:    Normal TM's and external ear canals, both ears  Nose:   Nares normal, septum midline, mucosa normal, no drainage    or sinus tenderness  Throat:   Lips, mucosa, and tongue normal; teeth and gums normal  Neck:   Supple, symmetrical, trachea midline, no adenopathy;    thyroid:  no enlargement/tenderness/nodules; no carotid   bruit or JVD  Back:     Symmetric, no curvature, ROM normal, no CVA tenderness  Lungs:     Clear to auscultation bilaterally, respirations unlabored  Chest Wall:    No tenderness or deformity   Heart:    Regular rate and rhythm, S1 and S2 normal, no murmur, rub   or gallop  Breast Exam:    No tenderness, masses, or nipple abnormality  Abdomen:     Soft, non-tender, bowel sounds active all four quadrants,    no masses, no organomegaly  Genitalia:    Normal female without lesion, discharge or tenderness  Rectal:    Normal tone, normal prostate, no masses or tenderness;   guaiac negative stool  Extremities:   Extremities normal, atraumatic, no cyanosis or edema  Pulses:   2+ and symmetric all extremities  Skin:   Skin color, texture, turgor normal, no rashes or lesions  Lymph nodes:   Cervical, supraclavicular, and axillary nodes normal  Neurologic:   CNII-XII intact, normal strength, sensation and reflexes    throughout    Predictors of intubation difficulty:  Morbid obesity? yes - bmi 33    Cardiographics ECG:  per cardiologyEchocardiogram: per cardiology   Lab Review  Office Visit on 03/12/2019  Component Date Value  . BNP 03/12/2019 13.0   Office Visit on 02/10/2019  Component Date Value  . Cholesterol 02/10/2019 157   . Triglycerides 02/10/2019 113.0   . HDL 02/10/2019 55.60   . VLDL 02/10/2019 22.6   . LDL Cholesterol 02/10/2019 79   . Total CHOL/HDL Ratio  02/10/2019 3   . NonHDL 02/10/2019 101.11   . TSH 02/10/2019 4.23   . Sodium 02/10/2019 139   . Potassium 02/10/2019 3.7   . Chloride 02/10/2019 101   . CO2 02/10/2019 31   . Glucose, Bld 02/10/2019 104*  . BUN 02/10/2019 18   . Creatinine, Ser 02/10/2019 1.04   . Total Bilirubin 02/10/2019 0.4   . Alkaline Phosphatase 02/10/2019 43   . AST 02/10/2019 15   . ALT 02/10/2019 8   . Total Protein 02/10/2019 6.4   . Albumin 02/10/2019 4.3   . Calcium 02/10/2019 9.0   . GFR 02/10/2019 51.76*      Assessment:      73 y.o. female with planned surgery as above.   Known risk factors for perioperative complications: low ext edema , hyperlipidemia    Difficulty with intubation is not anticipated.      Plan:    1. Preoperative workup as follows scheduled already, cardiac clearance done . 2. Change in medication regimen before surgery: per surgeical team instructions and cardiology. 3. Deep vein thrombosis prophylaxis postoperatively:regimen to be chosen by surgical team. 4. Surveillance for postoperative MI with ECG immediately postoperatively and on postoperative days 1 and 2 AND troponin levels 24 hours postoperatively and on day 4 or hospital discharge (whichever comes first): at the discretion of anesthesiologist. 5. Other measures: consult hospitalist if needed

## 2019-04-22 NOTE — Progress Notes (Signed)
PCP - Lyndal Pulley with surgical clearance on 04-03-19   Cardiologist - Eleonore Chiquito w/cardiac clearance in 03-12-19 doppler note under CV procedure  Chest x-ray -  EKG -  Stress Test -  ECHO - 02-17-19  Cardiac Cath -   Sleep Study -  CPAP -   Fasting Blood Sugar -  Checks Blood Sugar _____ times a day  Blood Thinner Instructions: Aspirin Instructions: Last Dose:  Anesthesia review:   Patient denies shortness of breath, fever, cough and chest pain at PAT appointment   Patient verbalized understanding of instructions that were given to them at the PAT appointment. Patient was also instructed that they will need to review over the PAT instructions again at home before surgery.

## 2019-04-22 NOTE — Patient Instructions (Addendum)
DUE TO COVID-19 ONLY ONE VISITOR IS ALLOWED TO COME WITH YOU AND STAY IN THE WAITING ROOM ONLY DURING PRE OP AND PROCEDURE DAY OF SURGERY. THE 1 VISITOR MAY VISIT WITH YOU AFTER SURGERY IN YOUR PRIVATE ROOM DURING VISITING HOURS ONLY!  YOU NEED TO HAVE A COVID 19 TEST ON 04/23/2019 @  10:20 am, THIS TEST MUST BE DONE BEFORE SURGERY, COME  [place], [place] , [address].  ([place]) ONCE YOUR COVID TEST IS COMPLETED, PLEASE BEGIN THE QUARANTINE INSTRUCTIONS AS OUTLINED IN YOUR HANDOUT.                Joan [name]  04/22/2019   Your procedure is scheduled on: 04-27-19    Report to [facility] Main  Entrance    Report to Admitting at 12:20 PM     Call this number if you have problems the morning of surgery [phone]    Remember: AFTER MIDNIGHT THE NIGHT PRIOR TO SURGERY. NOTHING EXCEPT CLEAR LIQUIDS UNTIL 11:50 AM . PLEASE FINISH ENSURE DRINK PER SURGEON ORDER  WHICH NEEDS TO BE COMPLETED AT 11:50 AM .   CLEAR LIQUID DIET   Foods Allowed                                                                     Foods Excluded  Coffee and tea, regular and decaf                             liquids that you cannot  Plain Jell-O any favor except red or purple                                           see through such as: Fruit ices (not with fruit pulp)                                     milk, soups, orange juice  Iced Popsicles                                    All solid food Carbonated beverages, regular and diet                                    Cranberry, grape and apple juices Sports drinks like Gatorade Lightly seasoned clear broth or consume(fat free) Sugar, honey syrup  Sample Menu Breakfast                                Lunch                                     Supper Cranberry juice  Beef broth                            Chicken broth Jell-O                                     Grape juice                            Apple juice Coffee or tea                        Jell-O                                      Popsicle                                                Coffee or tea                        Coffee or tea  _____________________________________________________________________       Take these medicines the morning of surgery with A SIP OF WATER: Fenofibrate, Levothyroxine (Synthroid), Oxycodone-Acetaminophen if needed. You may also use your eyedrops  BRUSH YOUR TEETH MORNING OF SURGERY AND RINSE YOUR MOUTH OUT, NO CHEWING GUM CANDY OR MINTS                                You may not have any metal on your body including hair pins and              piercings    Do not wear jewelry, make-up, lotions, powders or perfumes, deodorant             Do not wear nail polish on your fingernails.  Do not shave  48 hours prior to surgery.               Do not bring valuables to the hospital. Joan [name].  Contacts, dentures or bridgework may not be worn into surgery.  Leave suitcase in the car. After surgery it may be brought to your room.                  Please read over the following fact sheets you were given:MRSA Facts Sheet  _____________________________________________________________________             [facility] [address] - Preparing for Surgery Before surgery, you can play an important role.  Because skin is not sterile, your skin needs to be as free of germs as possible.  You can reduce the number of germs on your skin by washing with CHG (chlorahexidine gluconate) soap before surgery.  CHG is an antiseptic cleaner which kills germs and bonds with the skin to continue killing germs even after washing. Please DO NOT use if you have an allergy to CHG or antibacterial soaps.  If your skin becomes reddened/irritated stop using the CHG and inform  your nurse when you arrive at Short Stay. Do not shave (including legs and underarms) for at least 48 hours  prior to the first CHG shower.  You may shave your face/neck. Please follow these instructions carefully:  1.  Shower with CHG Soap the night before surgery and the  morning of Surgery.  2.  If you choose to wash your hair, wash your hair first as usual with your  normal  shampoo.  3.  After you shampoo, rinse your hair and body thoroughly to remove the  shampoo.                           4.  Use CHG as you would any other liquid soap.  You can apply chg directly  to the skin and wash                       Gently with a scrungie or clean washcloth.  5.  Apply the CHG Soap to your body ONLY FROM THE NECK DOWN.   Do not use on face/ open                           Wound or open sores. Avoid contact with eyes, ears mouth and genitals (private parts).                       Wash face,  Genitals (private parts) with your normal soap.             6.  Wash thoroughly, paying special attention to the area where your surgery  will be performed.  7.  Thoroughly rinse your body with warm water from the neck down.  8.  DO NOT shower/wash with your normal soap after using and rinsing off  the CHG Soap.                9.  Pat yourself dry with a clean towel.            10.  Wear clean pajamas.            11.  Place clean sheets on your bed the night of your first shower and do not  sleep with pets. Day of Surgery : Do not apply any lotions/deodorants the morning of surgery.  Please wear clean clothes to the hospital/surgery center.  FAILURE TO FOLLOW THESE INSTRUCTIONS MAY RESULT IN THE CANCELLATION OF YOUR SURGERY PATIENT SIGNATURE_________________________________  NURSE SIGNATURE__________________________________  ________________________________________________________________________   Joan [name]  An incentive spirometer is a tool that can help keep your lungs clear and active. This tool measures how well you are filling your lungs with each breath. Taking long deep breaths may help reverse  or decrease the chance of developing breathing (pulmonary) problems (especially infection) following:  A long period of time when you are unable to move or be active. BEFORE THE PROCEDURE   If the spirometer includes an indicator to show your best effort, your nurse or respiratory therapist will set it to a desired goal.  If possible, sit up straight or lean slightly forward. Try not to slouch.  Hold the incentive spirometer in an upright position. INSTRUCTIONS FOR USE  1. Sit on the edge of your bed if possible, or sit up as far as you can in bed or on a chair. 2. Hold the incentive spirometer in an upright position.  3. Breathe out normally. 4. Place the mouthpiece in your mouth and seal your lips tightly around it. 5. Breathe in slowly and as deeply as possible, raising the piston or the ball toward the top of the column. 6. Hold your breath for 3-5 seconds or for as long as possible. Allow the piston or ball to fall to the bottom of the column. 7. Remove the mouthpiece from your mouth and breathe out normally. 8. Rest for a few seconds and repeat Steps 1 through 7 at least 10 times every 1-2 hours when you are awake. Take your time and take a few normal breaths between deep breaths. 9. The spirometer may include an indicator to show your best effort. Use the indicator as a goal to work toward during each repetition. 10. After each set of 10 deep breaths, practice coughing to be sure your lungs are clear. If you have an incision (the cut made at the time of surgery), support your incision when coughing by placing a pillow or rolled up towels firmly against it. Once you are able to get out of bed, walk around indoors and cough well. You may stop using the incentive spirometer when instructed by your caregiver.  RISKS AND COMPLICATIONS  Take your time so you do not get dizzy or light-headed.  If you are in pain, you may need to take or ask for pain medication before doing incentive  spirometry. It is harder to take a deep breath if you are having pain. AFTER USE  Rest and breathe slowly and easily.  It can be helpful to keep track of a log of your progress. Your caregiver can provide you with a simple table to help with this. If you are using the spirometer at home, follow these instructions: [place] IF:   You are having difficultly using the spirometer.  You have trouble using the spirometer as often as instructed.  Your pain medication is not giving enough relief while using the spirometer.  You develop fever of 100.5 F (38.1 C) or higher. SEEK IMMEDIATE MEDICAL CARE IF:   You cough up bloody sputum that had not been present before.  You develop fever of 102 F (38.9 C) or greater.  You develop worsening pain at or near the incision site. MAKE SURE YOU:   Understand these instructions.  Will watch your condition.  Will get help right away if you are not doing well or get worse. Document Released: 11/05/2006 Document Revised: 09/17/2011 Document Reviewed: 01/06/2007 ExitCare Patient Information 2014 [employer], [place].   ________________________________________________________________________  WHAT IS A BLOOD TRANSFUSION? Blood Transfusion Information  A transfusion is the replacement of blood or some of its parts. Blood is made up of multiple cells which provide different functions.  Red blood cells carry oxygen and are used for blood loss replacement.  White blood cells fight against infection.  Platelets control bleeding.  Plasma helps clot blood.  Other blood products are available for specialized needs, such as hemophilia or other clotting disorders. BEFORE THE TRANSFUSION  Who gives blood for transfusions?   Healthy volunteers who are fully evaluated to make sure their blood is safe. This is blood bank blood. Transfusion therapy is the safest it has ever been in the practice of medicine. Before blood is taken from a donor, a  complete history is taken to make sure that person has no history of diseases nor engages in risky social behavior (examples are intravenous drug use or sexual activity with multiple partners).  The donor's travel history is screened to minimize risk of transmitting infections, such as malaria. The donated blood is tested for signs of infectious diseases, such as HIV and hepatitis. The blood is then tested to be sure it is compatible with you in order to minimize the chance of a transfusion reaction. If you or a relative donates blood, this is often done in anticipation of surgery and is not appropriate for emergency situations. It takes many days to process the donated blood. RISKS AND COMPLICATIONS Although transfusion therapy is very safe and saves many lives, the main dangers of transfusion include:   Getting an infectious disease.  Developing a transfusion reaction. This is an allergic reaction to something in the blood you were given. Every precaution is taken to prevent this. The decision to have a blood transfusion has been considered carefully by your caregiver before blood is given. Blood is not given unless the benefits outweigh the risks. AFTER THE TRANSFUSION  Right after receiving a blood transfusion, you will usually feel much better and more energetic. This is especially true if your red blood cells have gotten low (anemic). The transfusion raises the level of the red blood cells which carry oxygen, and this usually causes an energy increase.  The nurse administering the transfusion will monitor you carefully for complications. HOME CARE INSTRUCTIONS  No special instructions are needed after a transfusion. You may find your energy is better. Speak with your caregiver about any limitations on activity for underlying diseases you may have. SEEK MEDICAL CARE IF:   Your condition is not improving after your transfusion.  You develop redness or irritation at the intravenous (IV)  site. SEEK IMMEDIATE MEDICAL CARE IF:  Any of the following symptoms occur over the next 12 hours:  Shaking chills.  You have a temperature by mouth above 102 F (38.9 C), not controlled by medicine.  Chest, back, or muscle pain.  People around you feel you are not acting correctly or are confused.  Shortness of breath or difficulty breathing.  Dizziness and fainting.  You get a rash or develop hives.  You have a decrease in urine output.  Your urine turns a dark color or changes to pink, red, or brown. Any of the following symptoms occur over the next 10 days:  You have a temperature by mouth above 102 F (38.9 C), not controlled by medicine.  Shortness of breath.  Weakness after normal activity.  The white part of the eye turns yellow (jaundice).  You have a decrease in the amount of urine or are urinating less often.  Your urine turns a dark color or changes to pink, red, or brown. Document Released: 06/22/2000 Document Revised: 09/17/2011 Document Reviewed: 02/09/2008 [facility] Patient Information 2014 [place], [place].  _______________________________________________________________________

## 2019-04-23 ENCOUNTER — Encounter (HOSPITAL_COMMUNITY): Payer: Self-pay

## 2019-04-23 ENCOUNTER — Other Ambulatory Visit (HOSPITAL_COMMUNITY)
Admission: RE | Admit: 2019-04-23 | Discharge: 2019-04-23 | Disposition: A | Discharge status: Home / Self Care | Payer: Medicare Other | Source: Ambulatory Visit | Attending: Orthopedic Surgery | Admitting: Orthopedic Surgery

## 2019-04-23 ENCOUNTER — Encounter (HOSPITAL_COMMUNITY)
Admission: RE | Admit: 2019-04-23 | Discharge: 2019-04-23 | Disposition: A | Discharge status: Home / Self Care | Payer: Medicare Other | Source: Ambulatory Visit | Attending: Orthopedic Surgery | Admitting: Orthopedic Surgery

## 2019-04-23 ENCOUNTER — Other Ambulatory Visit: Payer: Self-pay

## 2019-04-23 DIAGNOSIS — F17210 Nicotine dependence, cigarettes, uncomplicated: Secondary | ICD-10-CM | POA: Insufficient documentation

## 2019-04-23 DIAGNOSIS — E785 Hyperlipidemia, unspecified: Secondary | ICD-10-CM | POA: Insufficient documentation

## 2019-04-23 DIAGNOSIS — Z20828 Contact with and (suspected) exposure to other viral communicable diseases: Secondary | ICD-10-CM | POA: Insufficient documentation

## 2019-04-23 DIAGNOSIS — Z79899 Other long term (current) drug therapy: Secondary | ICD-10-CM | POA: Diagnosis not present

## 2019-04-23 DIAGNOSIS — Z7901 Long term (current) use of anticoagulants: Secondary | ICD-10-CM | POA: Insufficient documentation

## 2019-04-23 DIAGNOSIS — E039 Hypothyroidism, unspecified: Secondary | ICD-10-CM | POA: Diagnosis not present

## 2019-04-23 DIAGNOSIS — M1712 Unilateral primary osteoarthritis, left knee: Secondary | ICD-10-CM | POA: Insufficient documentation

## 2019-04-23 DIAGNOSIS — I493 Ventricular premature depolarization: Secondary | ICD-10-CM | POA: Insufficient documentation

## 2019-04-23 DIAGNOSIS — Z01812 Encounter for preprocedural laboratory examination: Secondary | ICD-10-CM | POA: Insufficient documentation

## 2019-04-23 DIAGNOSIS — M1612 Unilateral primary osteoarthritis, left hip: Secondary | ICD-10-CM | POA: Diagnosis not present

## 2019-04-23 LAB — CBC
HCT: 39.9 % (ref 36.0–46.0)
Hemoglobin: 12.9 g/dL (ref 12.0–15.0)
MCH: 30.1 pg (ref 26.0–34.0)
MCHC: 32.3 g/dL (ref 30.0–36.0)
MCV: 93.2 fL (ref 80.0–100.0)
Platelets: 231 10*3/uL (ref 150–400)
RBC: 4.28 MIL/uL (ref 3.87–5.11)
RDW: 13.2 % (ref 11.5–15.5)
WBC: 4.7 10*3/uL (ref 4.0–10.5)
nRBC: 0 % (ref 0.0–0.2)

## 2019-04-23 LAB — COMPREHENSIVE METABOLIC PANEL
ALT: 17 U/L (ref 0–44)
AST: 22 U/L (ref 15–41)
Albumin: 4.4 g/dL (ref 3.5–5.0)
Alkaline Phosphatase: 44 U/L (ref 38–126)
Anion gap: 10 (ref 5–15)
BUN: 21 mg/dL (ref 8–23)
CO2: 31 mmol/L (ref 22–32)
Calcium: 8.8 mg/dL — ABNORMAL LOW (ref 8.9–10.3)
Chloride: 98 mmol/L (ref 98–111)
Creatinine, Ser: 1.13 mg/dL — ABNORMAL HIGH (ref 0.44–1.00)
GFR calc Af Amer: 55 mL/min — ABNORMAL LOW (ref >60)
GFR calc non Af Amer: 48 mL/min — ABNORMAL LOW (ref >60)
Glucose, Bld: 110 mg/dL — ABNORMAL HIGH (ref 70–99)
Potassium: 2.9 mmol/L — ABNORMAL LOW (ref 3.5–5.1)
Sodium: 139 mmol/L (ref 135–145)
Total Bilirubin: 0.8 mg/dL (ref 0.3–1.2)
Total Protein: 7.2 g/dL (ref 6.5–8.1)

## 2019-04-23 LAB — APTT: aPTT: 30 seconds (ref 24–36)

## 2019-04-23 LAB — PROTIME-INR
INR: 0.9 (ref 0.8–1.2)
Prothrombin Time: 12.2 seconds (ref 11.4–15.2)

## 2019-04-23 LAB — SURGICAL PCR SCREEN
MRSA, PCR: NEGATIVE
Staphylococcus aureus: NEGATIVE

## 2019-04-23 NOTE — H&P (Signed)
TOTAL HIP ADMISSION H&P  Patient is admitted for left total hip arthroplasty.  Subjective:  Chief Complaint: left hip pain  HPI: Joan [name], 73 y.o. female, has a history of pain and functional disability in the left hip(s) due to arthritis and patient has failed non-surgical conservative treatments for greater than 12 weeks to include NSAID's and/or analgesics, corticosteriod injections, flexibility and strengthening excercises, use of assistive devices and activity modification.  Onset of symptoms was gradual starting 3 years ago with gradually worsening course since that time.The patient noted no past surgery on the left hip(s).  Patient currently rates pain in the left hip at 7 out of 10 with activity. Patient has night pain, worsening of pain with activity and weight bearing, pain that interfers with activities of daily living and pain with passive range of motion. Patient has evidence of periarticular osteophytes and joint space narrowing by imaging studies. This condition presents safety issues increasing the risk of falls. There is no current active infection.  Patient Active Problem List   Diagnosis Date Noted  . Insomnia 12/13/2016  . Preventative health care 09/08/2014  . Ovarian cyst 08/13/2014  . Preoperative cardiovascular examination 08/13/2014  . Abnormal EKG 08/13/2014  . PVC's (premature ventricular contractions)   . Fracture of left distal radius 12/30/2013  . Spinal stenosis of lumbar region 06/24/2013  . Atherosclerosis of aorta ([place]) 05/06/2013  . Osteopenia 03/06/2013  . Edema 11/05/2012  . Myalgia 04/11/2011  . Night sweats 12/27/2010  . Contact lens/glasses fitting 12/27/2010  . Menopause 12/27/2010  . Family history of breast cancer 12/27/2010  . ADJUSTMENT DISORDER WITH ANXIOUS MOOD 05/26/2010  . PULMONARY NODULE, LEFT UPPER LOBE 12/15/2009  . CONSTIPATION, DRUG INDUCED 08/19/2009  . KNEE PAIN, LEFT 09/13/2008  . THYROID NODULE, LEFT 05/26/2008  .  GERD 05/26/2008  . INSOMNIA 05/06/2008  . Anemia 10/24/2007  . Hypothyroidism 08/12/2007  . Hyperlipidemia 08/12/2007  . TOBACCO ABUSE 08/12/2007  . Essential hypertension 08/12/2007  . OA (osteoarthritis) of knee 08/12/2007  . Hx of colonic polyps 08/28/2004   Past Medical History:  Diagnosis Date  . Arthritis    "shoulders; wrist; probably spine" (06/24/2013)  . Chronic low back pain    followed by Dr Hardin Negus pain mgt  . Colon polyp   . Constipation   . Esophageal stricture   . Finger pain, left    2 fingers on left hand since wrist surgery  . GERD (gastroesophageal reflux disease)   . Heart murmur    "slight; not on RX" (06/24/2013)  . History of cardiac arrhythmia    cardiologist- Traci Turner  . Hx of colonic polyps 08/28/2004  . Hyperlipemia   . Hypertension   . Hypothyroidism   . Osteoarthritis of left knee    advanced  . PONV (postoperative nausea and vomiting)    Pt reports symptoms are the result of gallbladder and cholecystectomy, not anesthesia  . PVC's (premature ventricular contractions)   . Sleep apnea 1990's   "tested; tried mask; couldn't stand it; told me as long as I slept on my side I'd be ok" (06/24/2013)  . Thyroid nodule   . Tobacco abuse     Past Surgical History:  Procedure Laterality Date  . ABDOMINAL HYSTERECTOMY  1988   "partial" (06/24/2013)  . ABDOMINAL HYSTERECTOMY     partial in 1988  . ANKLE SURGERY Left 1995   "tendon repair" (06/24/2013)  . BACK SURGERY     "think today was my 8th back OR" (06/24/2013)  .  CERVICAL SPINE SURGERY  2012  . [place]  . CHOLECYSTECTOMY N/A 04/16/2013   Procedure: LAPAROSCOPIC CHOLECYSTECTOMY ;  Surgeon: Imogene Burn. Georgette Dover, MD;  Location: [facility];  Service: General;  Laterality: N/A;  . ELBOW SURGERY  1990's  . FOOT SURGERY Right 2012   SPUR REMOVED  . INFUSION PUMP IMPLANTATION  1990's   "implantablet morphine pump; took it out w/in 11 months  . KNEE ARTHROSCOPY Left 1991; ~ 1993  .  LAPAROSCOPIC LYSIS OF ADHESIONS N/A 04/16/2013   Procedure: LAPAROSCOPIC LYSIS OF ADHESIONS;  Surgeon: Imogene Burn. Georgette Dover, MD;  Location: [facility];  Service: General;  Laterality: N/A;  . LUMBAR FUSION     and rods  . LUMBAR LAMINECTOMY/DECOMPRESSION MICRODISCECTOMY N/A 11/28/2012   Procedure: DECOMPRESSIVE LUMBAR LAMINECTOMY LEVEL 1;  Surgeon: Elaina Hoops, MD;  Location: [place] [facility];  Service: Neurosurgery;  Laterality: N/A;  DECOMPRESSIVE LUMBAR LAMINECTOMY LEVEL 1  . ORIF DISTAL RADIUS FRACTURE Left 12/30/2013   dr Caralyn Guile  . ORIF WRIST FRACTURE Left 12/30/2013   Procedure: OPEN REDUCTION INTERNAL FIXATION (ORIF) LEFT WRIST FRACTURE AND REPAIR AS INDICATED;  Surgeon: Linna Hoff, MD;  Location: [facility];  Service: Orthopedics;  Laterality: Left;  . POSTERIOR FUSION LUMBAR SPINE  06/24/2013  . ROBOTIC ASSISTED SALPINGO OOPHERECTOMY Bilateral 08/20/2014   Procedure: ROBOTIC ASSISTED SALPINGO OOPHORECTOMY;  Surgeon: Daria Pastures, MD;  Location: [facility];  Service: Gynecology;  Laterality: Bilateral;  . SHOULDER ARTHROSCOPY Left 09/2011  . THYROIDECTOMY  2012  . TOTAL KNEE ARTHROPLASTY Right 09/09/2017   Procedure: RIGHT TOTAL KNEE ARTHROPLASTY;  Surgeon: Gaynelle Arabian, MD;  Location: [facility];  Service: Orthopedics;  Laterality: Right;       Current Outpatient Medications  Medication Sig Dispense Refill Last Dose  . bisacodyl (DULCOLAX) 5 MG EC tablet Take 5 mg by mouth daily as needed for mild constipation.  30 tablet    . carisoprodol (SOMA) 350 MG tablet Take 175 mg by mouth 3 (three) times daily.      . dorzolamide-timolol (COSOPT) 22.3-6.8 MG/ML ophthalmic solution Place 1 drop into the left eye 2 (two) times daily.     Marland Kitchen estradiol (ESTRACE) 0.5 MG tablet Take 0.5 mg by mouth at bedtime.   4   . fenofibrate 160 MG tablet Take 1 tablet by mouth once daily (Patient taking differently: Take 160 mg by mouth daily. ) 90 tablet 1   . furosemide (LASIX) 40 MG tablet Take 1 tablet (40 mg total) by  mouth daily. 30 tablet 3   . hydrochlorothiazide (HYDRODIURIL) 25 MG tablet Take 25 mg by mouth daily.      Marland Kitchen levothyroxine (SYNTHROID) 88 MCG tablet TAKE 1 TABLET BY MOUTH  DAILY BEFORE BREAKFAST (Patient taking differently: Take 88 mcg by mouth daily before breakfast. ) 90 tablet 3   . oxyCODONE-acetaminophen (PERCOCET) 10-325 MG tablet Take 1 tablet by mouth every 4 (four) hours as needed. for pain     . potassium chloride SA (K-DUR) 20 MEQ tablet TAKE 3 TABLETS BY MOUTH  DAILY (Patient taking differently: Take 60 mEq by mouth daily. ) 270 tablet 3   . simvastatin (ZOCOR) 20 MG tablet Take 1 tablet (20 mg total) by mouth at bedtime. 90 tablet 1   . traZODone (DESYREL) 50 MG tablet Take 1 tablet (50 mg total) by mouth at bedtime. 90 tablet 1    Allergies  Allergen Reactions  . Morphine And Related Swelling  . 5-Alpha Reductase Inhibitors   .  Amitriptyline Swelling    Leg swelling  . Gabapentin Itching  . Triamterene Itching and Rash    Social History   Tobacco Use  . Smoking status: Current Every Day Smoker    Packs/day: 1.00    Years: 50.00    Pack years: 50.00    Types: Cigarettes  . Smokeless tobacco: Never Used  Substance Use Topics  . Alcohol use: No    Alcohol/week: 0.0 standard drinks    Family History  Problem Relation Age of Onset  . Pancreatic cancer Father   . Cancer Father   . Diabetes type II Mother   . Hypertension Mother   . Coronary artery disease Mother 48  . Diabetes Mother   . Thyroid cancer Mother   . Skin cancer Mother   . Diabetes Sister   . Hypertension Sister   . Bone cancer Sister   . Breast cancer Sister   . Hypertension Brother   . Breast cancer Maternal Aunt   . Other Neg Hx        No family history of  colon cancer     Review of Systems  Constitutional: Negative.   HENT: Negative.   Eyes: Negative.   Respiratory: Negative.   Cardiovascular: Negative.   Gastrointestinal: Negative.   Genitourinary: Negative.   Musculoskeletal:  Positive for back pain, joint pain and myalgias. Negative for falls and neck pain.  Skin: Negative.   Neurological: Negative.   Endo/Heme/Allergies: Negative.   Psychiatric/Behavioral: Negative.     Objective:  Physical Exam  Constitutional: She is oriented to person, place, and time. She appears well-developed and well-nourished. No distress.  Obese  HENT:  Head: Normocephalic and atraumatic.  Right Ear: External ear normal.  Left Ear: External ear normal.  Nose: Nose normal.  Mouth/Throat: Oropharynx is clear and moist.  Eyes: Conjunctivae and EOM are normal.  Neck: Normal range of motion. Neck supple.  Cardiovascular: Normal rate, regular rhythm, normal heart sounds and intact distal pulses.  No murmur heard. Respiratory: Effort normal and breath sounds normal. No respiratory distress. She has no wheezes.  GI: Soft. Bowel sounds are normal. She exhibits no distension. There is no abdominal tenderness.  Musculoskeletal:     Comments: Significant antalgic gait with the use of a cane.  Left Hip Exam: ROM: Flexion to 90, Internal Rotation 0, External Rotation 5, and Abduction 20 degrees. There is no tenderness over the greater trochanter bursa.  Neurological: She is alert and oriented to person, place, and time. She has normal strength. No sensory deficit.  Skin: No rash noted. She is not diaphoretic. No erythema.  Psychiatric: She has a normal mood and affect. Her behavior is normal.    Vital signs in last 24 hours: Temp:  [97.7 F (36.5 C)] 97.7 F (36.5 C) (10/15 0837) Pulse Rate:  [62] 62 (10/15 0837) Resp:  [18] 18 (10/15 0837) BP: (132)/(63) 132/63 (10/15 0837) SpO2:  [99 %] 99 % (10/15 0837) Weight:  [76.7 kg] 76.7 kg (10/15 0837)  Labs:   Estimated body mass index is 33.03 kg/m as calculated from the following:   Height as of 04/23/19: 5' (1.524 m).   Weight as of 04/23/19: 76.7 kg.   Imaging Review Plain radiographs demonstrate severe degenerative joint  disease of the left hip(s). The bone quality appears to be good for age and reported activity level.    Assessment/Plan:  End stage primary osteoarthritis, left hip(s)  The patient history, physical examination, clinical judgement of the provider and  imaging studies are consistent with end stage degenerative joint disease of the left hip(s) and total hip arthroplasty is deemed medically necessary. The treatment options including medical management, injection therapy, arthroscopy and arthroplasty were discussed at length. The risks and benefits of total hip arthroplasty were presented and reviewed. The risks due to aseptic loosening, infection, stiffness, dislocation/subluxation,  thromboembolic complications and other imponderables were discussed.  The patient acknowledged the explanation, agreed to proceed with the plan and consent was signed. Patient is being admitted for inpatient treatment for surgery, pain control, PT, OT, prophylactic antibiotics, VTE prophylaxis, progressive ambulation and ADL's and discharge planning.The patient is planning to be discharged home.   Risks and benefits of the surgery were discussed with the patient and Dr.Aluisio at their previous office visit, and the patient has elected to move forward with the aforementioned surgery. Post-operative care plans were discussed with the patient today.  Therapy Plans: HEP Disposition: Home with son Planned DVT prophylaxis: aspirin 325mg  BID DME needed: none needed PCP: Dr. Garnet Koyanagi- clearance received Other: ok with codeine Has had EIGHT back surgeries and has hardware- spinal may be difficult  Instructed patient on meds to stop prior to surgery   Ardeen Jourdain, PA-C

## 2019-04-24 NOTE — Progress Notes (Addendum)
Anesthesia Chart Review   Case: [id number] Date/Time: 04/27/19 1435   Procedure: TOTAL HIP ARTHROPLASTY ANTERIOR APPROACH (Left Hip) - 158min   Anesthesia type: Choice   Pre-op diagnosis: Left hip osteoarthritis   [facility]: [facility]OM 10 / [facility]   Surgeon: Gaynelle Arabian, MD      DISCUSSION:73 y.o. current every day smoker (50 pack years) with h/o PONV, hypothyroidism, GERD, HTN, PVCs, left hip OA scheduled for above procedure 04/27/2019 with Dr. Gaynelle Arabian.   Pt seen by cardiologist, Dr. Eleonore Chiquito, 03/12/2019 for preoperative evaluation.  Per OV note, "We will obtain the venous ultrasound as described above to exclude DVT.  She has had normal ABIs.  She had a recent nuclear medicine stress test in 2010 that was normal.  Her most recent echocardiogram shows an ejection fraction of likely 50 to 55%.  She also reports no real significant change in her shortness of breath or activity level.  I think at this time I would not recommend further testing other than the above test we have plan prior to surgery.  If her venous duplex looks okay we will let her go to surgery. Her orthopedic surgeon is Hector Shade, MD at [facility] and we will send him our report".  Lower extremity US 03/12/2019 with no sign of DVT.  Per Dr. Audie Box on results, "Please let Mrs. Pavy know she does not have a DVT. I will send a letter to Dr. Maureen Ralphs that she can proceed with surgery."  Cleared by PCP after preoperative evaluation 04/03/2019.   Potassium 2.9, surgeon made aware.  Will recheck DOS.   Anticipate pt can proceed with planned procedure barring acute status change.   VS: BP 132/63 (BP Location: Left Arm)   Pulse 62   Temp 36.5 C (Oral)   Resp 18   Ht 5' (1.524 m)   Wt 76.7 kg   SpO2 99%   BMI 33.03 kg/m   PROVIDERS: [name], DO is PCP   Eleonore Chiquito, MD is Cardiologist  LABS: Labs reviewed: Acceptable for surgery. (all labs ordered are listed, but only abnormal results are  displayed)  Labs Reviewed  COMPREHENSIVE METABOLIC PANEL - Abnormal; Notable for the following components:      Result Value   Potassium 2.9 (*)    Glucose, Bld 110 (*)    Creatinine, Ser 1.13 (*)    Calcium 8.8 (*)    GFR calc non Af Amer 48 (*)    GFR calc Af Amer 55 (*)    All other components within normal limits  SURGICAL PCR SCREEN  APTT  CBC  PROTIME-INR  TYPE AND SCREEN     IMAGES:   EKG: 03/12/2019 Rate 64 bpm Normal sinus rhythm  Nonspecific ST and T wave abnormality   CV: Echo 02/17/2019 IMPRESSIONS   1. The left ventricle has mildly reduced systolic function, with an ejection fraction of 45-50%. The cavity size was normal. Left ventricular diastolic Doppler parameters are consistent with impaired relaxation. Left ventricular diffuse hypokinesis.  Cannot rule out apical wall motion abnormality or thrombus as apex is not adequately visualized.  2. The right ventricle has normal systolic function. The cavity was normal. There is no increase in right ventricular wall thickness. Right ventricular systolic pressure could not be assessed.  3. The aortic valve was not well visualized. Mild sclerosis of the aortic valve. Aortic valve regurgitation was not assessed by color flow Doppler.  4. The aorta is normal in size and structure.  5. Recommend limited study with definity contrast to assess wall motion.  Myocardial Perfusion 01/24/17  Nuclear stress EF: 64%.  The left ventricular ejection fraction is normal (55-65%).  There was no ST segment deviation noted during stress.  No T wave inversion was noted during stress.  This is a low risk study. Past Medical History:  Diagnosis Date  . Arthritis    "shoulders; wrist; probably spine" (06/24/2013)  . Chronic low back pain    followed by Dr Hardin Negus pain mgt  . Colon polyp   . Constipation   . Esophageal stricture   . Finger pain, left    2 fingers on left hand since wrist surgery  . GERD (gastroesophageal  reflux disease)   . Heart murmur    "slight; not on RX" (06/24/2013)  . History of cardiac arrhythmia    cardiologist- Traci Turner  . Hx of colonic polyps 08/28/2004  . Hyperlipemia   . Hypertension   . Hypothyroidism   . Osteoarthritis of left knee    advanced  . PONV (postoperative nausea and vomiting)    Pt reports symptoms are the result of gallbladder and cholecystectomy, not anesthesia  . PVC's (premature ventricular contractions)   . Sleep apnea 1990's   "tested; tried mask; couldn't stand it; told me as long as I slept on my side I'd be ok" (06/24/2013)  . Thyroid nodule   . Tobacco abuse     Past Surgical History:  Procedure Laterality Date  . ABDOMINAL HYSTERECTOMY  1988   "partial" (06/24/2013)  . ABDOMINAL HYSTERECTOMY     partial in 1988  . ANKLE SURGERY Left 1995   "tendon repair" (06/24/2013)  . BACK SURGERY     "think today was my 8th back OR" (06/24/2013)  . CERVICAL SPINE SURGERY  2012  . [place]  . CHOLECYSTECTOMY N/A 04/16/2013   Procedure: LAPAROSCOPIC CHOLECYSTECTOMY ;  Surgeon: Imogene Burn. Georgette Dover, MD;  Location[facility];  Service: General;  Laterality: N/A;  . ELBOW SURGERY  1990's  . FOOT SURGERY Right 2012   SPUR REMOVED  . INFUSION PUMP IMPLANTATION  1990's   "implantablet morphine pump; took it out w/in 11 months  . KNEE ARTHROSCOPY Left 1991; ~ 1993  . LAPAROSCOPIC LYSIS OF ADHESIONS N/A 04/16/2013   Procedure: LAPAROSCOPIC LYSIS OF ADHESIONS;  Surgeon: Imogene Burn. Georgette Dover, MD;  [facility];  Service: General;  Laterality: N/A;  . LUMBAR FUSION     and rods  . LUMBAR LAMINECTOMY/DECOMPRESSION MICRODISCECTOMY N/A 11/28/2012   Procedure: DECOMPRESSIVE LUMBAR LAMINECTOMY LEVEL 1;  Surgeon: Elaina Hoops, MD;  Location: [place] [facility];  Service: Neurosurgery;  Laterality: N/A;  DECOMPRESSIVE LUMBAR LAMINECTOMY LEVEL 1  . ORIF DISTAL RADIUS FRACTURE Left 12/30/2013   dr Caralyn Guile  . ORIF WRIST FRACTURE Left 12/30/2013   Procedure: OPEN  REDUCTION INTERNAL FIXATION (ORIF) LEFT WRIST FRACTURE AND REPAIR AS INDICATED;  Surgeon: Linna Hoff, MD;  Location: [place];  Service: Orthopedics;  Laterality: Left;  . POSTERIOR FUSION LUMBAR SPINE  06/24/2013  . ROBOTIC ASSISTED SALPINGO OOPHERECTOMY Bilateral 08/20/2014   Procedure: ROBOTIC ASSISTED SALPINGO OOPHORECTOMY;  Surgeon: Daria Pastures, MD;  Location: [place] [facility];  Service: Gynecology;  Laterality: Bilateral;  . SHOULDER ARTHROSCOPY Left 09/2011  . THYROIDECTOMY  2012  . TOTAL KNEE ARTHROPLASTY Right 09/09/2017   Procedure: RIGHT TOTAL KNEE ARTHROPLASTY;  Surgeon: Gaynelle Arabian, MD;  [facility];  Service: Orthopedics;  Laterality: Right;    MEDICATIONS: . bisacodyl (DULCOLAX)  5 MG EC tablet  . carisoprodol (SOMA) 350 MG tablet  . dorzolamide-timolol (COSOPT) 22.3-6.8 MG/ML ophthalmic solution  . estradiol (ESTRACE) 0.5 MG tablet  . fenofibrate 160 MG tablet  . furosemide (LASIX) 40 MG tablet  . hydrochlorothiazide (HYDRODIURIL) 25 MG tablet  . levothyroxine (SYNTHROID) 88 MCG tablet  . oxyCODONE-acetaminophen (PERCOCET) 10-325 MG tablet  . potassium chloride SA (K-DUR) 20 MEQ tablet  . simvastatin (ZOCOR) 20 MG tablet  . traZODone (DESYREL) 50 MG tablet   No current facility-administered medications for this encounter.      Maia Plan [facility] Pre-Surgical Testing [phone] 04/24/19  12:38 PM

## 2019-04-24 NOTE — Anesthesia Preprocedure Evaluation (Addendum)
Anesthesia Evaluation  Patient identified by MRN, date of birth, ID band Patient awake    Reviewed: Allergy & Precautions, NPO status , Patient's Chart, lab work & pertinent test results  History of Anesthesia Complications (+) PONV and history of anesthetic complications  Airway Mallampati: II  TM Distance: >3 FB Neck ROM: Full    Dental no notable dental hx.    Pulmonary Current Smoker and Patient abstained from smoking.,    Pulmonary exam normal breath sounds clear to auscultation       Cardiovascular hypertension, Pt. on medications Normal cardiovascular exam Rhythm:Regular Rate:Normal  ECG: NSR, rate 64  ECHO: 1. The left ventricle has mildly reduced systolic function, with an ejection fraction of 45-50%. The cavity size was normal. Left ventricular diastolic Doppler parameters are consistent with impaired relaxation. Left ventricular diffuse hypokinesis. Cannot rule out apical wall motion abnormality or thrombus as apex is not adequately visualized. 2. The right ventricle has normal systolic function. The cavity was normal. There is no increase in right ventricular wall thickness. Right ventricular systolic pressure could not be assessed. 3. The aortic valve was not well visualized. Mild sclerosis of the aortic valve. Aortic valve regurgitation was not assessed by color flow Doppler. 4. The aorta is normal in size and structure. 5. Recommend limited study with definity contrast to assess wall motion.   Neuro/Psych PSYCHIATRIC DISORDERS negative neurological ROS     GI/Hepatic negative GI ROS, Neg liver ROS,   Endo/Other  Hypothyroidism   Renal/GU negative Renal ROS     Musculoskeletal  (+) Arthritis , Osteoarthritis,  Chronic low back pain Ambulates with cane   Abdominal (+) + obese,   Peds  Hematology negative hematology ROS (+) HLD   Anesthesia Other Findings Left hip osteoarthritis  Reproductive/Obstetrics                           Anesthesia Physical Anesthesia Plan  ASA: II  Anesthesia Plan: Spinal   Post-op Pain Management:    Induction: Intravenous  PONV Risk Score and Plan: 2 and Ondansetron, Dexamethasone and Treatment may vary due to age or medical condition  Airway Management Planned: Simple Face Mask  Additional Equipment:   Intra-op Plan:   Post-operative Plan:   Informed Consent: I have reviewed the patients History and Physical, chart, labs and discussed the procedure including the risks, benefits and alternatives for the proposed anesthesia with the patient or authorized representative who has indicated his/her understanding and acceptance.     Dental advisory given  Plan Discussed with: CRNA  Anesthesia Plan Comments: (Reviewed PAT note 04/23/2019, Konrad Felix, PA-C)       Anesthesia Quick Evaluation

## 2019-04-25 LAB — NOVEL CORONAVIRUS, NAA (HOSP ORDER, SEND-OUT TO REF LAB; TAT 18-24 HRS): SARS-CoV-2, NAA: NOT DETECTED

## 2019-04-27 ENCOUNTER — Inpatient Hospital Stay (HOSPITAL_COMMUNITY): Payer: Medicare Other | Admitting: Physician Assistant

## 2019-04-27 ENCOUNTER — Other Ambulatory Visit: Payer: Self-pay

## 2019-04-27 ENCOUNTER — Encounter (HOSPITAL_COMMUNITY): Payer: Self-pay | Admitting: *Deleted

## 2019-04-27 ENCOUNTER — Inpatient Hospital Stay (HOSPITAL_COMMUNITY): Payer: Medicare Other

## 2019-04-27 ENCOUNTER — Encounter (HOSPITAL_COMMUNITY)
Admission: RE | Disposition: A | Discharge status: Home Health | Payer: Self-pay | Source: Home / Self Care | Attending: Orthopedic Surgery

## 2019-04-27 ENCOUNTER — Inpatient Hospital Stay (HOSPITAL_COMMUNITY)
Admission: RE | Admit: 2019-04-27 | Discharge: 2019-04-30 | DRG: 470 | Disposition: A | Discharge status: Home Health | Payer: Medicare Other | Attending: Orthopedic Surgery | Admitting: Orthopedic Surgery

## 2019-04-27 DIAGNOSIS — F17210 Nicotine dependence, cigarettes, uncomplicated: Secondary | ICD-10-CM | POA: Diagnosis present

## 2019-04-27 DIAGNOSIS — E669 Obesity, unspecified: Secondary | ICD-10-CM | POA: Diagnosis present

## 2019-04-27 DIAGNOSIS — M25752 Osteophyte, left hip: Secondary | ICD-10-CM | POA: Diagnosis not present

## 2019-04-27 DIAGNOSIS — Z888 Allergy status to other drugs, medicaments and biological substances status: Secondary | ICD-10-CM | POA: Diagnosis not present

## 2019-04-27 DIAGNOSIS — Z885 Allergy status to narcotic agent status: Secondary | ICD-10-CM | POA: Diagnosis not present

## 2019-04-27 DIAGNOSIS — M25552 Pain in left hip: Secondary | ICD-10-CM | POA: Diagnosis present

## 2019-04-27 DIAGNOSIS — I10 Essential (primary) hypertension: Secondary | ICD-10-CM | POA: Diagnosis not present

## 2019-04-27 DIAGNOSIS — Z79890 Hormone replacement therapy (postmenopausal): Secondary | ICD-10-CM

## 2019-04-27 DIAGNOSIS — Z96649 Presence of unspecified artificial hip joint: Secondary | ICD-10-CM

## 2019-04-27 DIAGNOSIS — M5410 Radiculopathy, site unspecified: Secondary | ICD-10-CM | POA: Diagnosis not present

## 2019-04-27 DIAGNOSIS — G8929 Other chronic pain: Secondary | ICD-10-CM | POA: Diagnosis present

## 2019-04-27 DIAGNOSIS — Z90710 Acquired absence of both cervix and uterus: Secondary | ICD-10-CM

## 2019-04-27 DIAGNOSIS — M169 Osteoarthritis of hip, unspecified: Secondary | ICD-10-CM | POA: Diagnosis present

## 2019-04-27 DIAGNOSIS — M8580 Other specified disorders of bone density and structure, unspecified site: Secondary | ICD-10-CM | POA: Diagnosis present

## 2019-04-27 DIAGNOSIS — R011 Cardiac murmur, unspecified: Secondary | ICD-10-CM | POA: Diagnosis present

## 2019-04-27 DIAGNOSIS — G4700 Insomnia, unspecified: Secondary | ICD-10-CM | POA: Diagnosis not present

## 2019-04-27 DIAGNOSIS — E785 Hyperlipidemia, unspecified: Secondary | ICD-10-CM | POA: Diagnosis not present

## 2019-04-27 DIAGNOSIS — Z86010 Personal history of colonic polyps: Secondary | ICD-10-CM | POA: Diagnosis not present

## 2019-04-27 DIAGNOSIS — K219 Gastro-esophageal reflux disease without esophagitis: Secondary | ICD-10-CM | POA: Diagnosis not present

## 2019-04-27 DIAGNOSIS — F4322 Adjustment disorder with anxiety: Secondary | ICD-10-CM | POA: Diagnosis present

## 2019-04-27 DIAGNOSIS — M1612 Unilateral primary osteoarthritis, left hip: Principal | ICD-10-CM | POA: Diagnosis present

## 2019-04-27 DIAGNOSIS — Z8249 Family history of ischemic heart disease and other diseases of the circulatory system: Secondary | ICD-10-CM | POA: Diagnosis not present

## 2019-04-27 DIAGNOSIS — Z6833 Body mass index (BMI) 33.0-33.9, adult: Secondary | ICD-10-CM | POA: Diagnosis not present

## 2019-04-27 DIAGNOSIS — E039 Hypothyroidism, unspecified: Secondary | ICD-10-CM | POA: Diagnosis present

## 2019-04-27 DIAGNOSIS — Z96651 Presence of right artificial knee joint: Secondary | ICD-10-CM | POA: Diagnosis not present

## 2019-04-27 DIAGNOSIS — Z96642 Presence of left artificial hip joint: Secondary | ICD-10-CM | POA: Diagnosis not present

## 2019-04-27 DIAGNOSIS — G4730 Sleep apnea, unspecified: Secondary | ICD-10-CM | POA: Diagnosis not present

## 2019-04-27 DIAGNOSIS — Z471 Aftercare following joint replacement surgery: Secondary | ICD-10-CM | POA: Diagnosis not present

## 2019-04-27 DIAGNOSIS — Z79899 Other long term (current) drug therapy: Secondary | ICD-10-CM | POA: Diagnosis not present

## 2019-04-27 DIAGNOSIS — Z419 Encounter for procedure for purposes other than remedying health state, unspecified: Secondary | ICD-10-CM

## 2019-04-27 HISTORY — PX: TOTAL HIP ARTHROPLASTY: SHX124

## 2019-04-27 LAB — COMPREHENSIVE METABOLIC PANEL
ALT: 15 U/L (ref 0–44)
AST: 20 U/L (ref 15–41)
Albumin: 3.9 g/dL (ref 3.5–5.0)
Alkaline Phosphatase: 40 U/L (ref 38–126)
Anion gap: 11 (ref 5–15)
BUN: 21 mg/dL (ref 8–23)
CO2: 25 mmol/L (ref 22–32)
Calcium: 8.4 mg/dL — ABNORMAL LOW (ref 8.9–10.3)
Chloride: 101 mmol/L (ref 98–111)
Creatinine, Ser: 1.1 mg/dL — ABNORMAL HIGH (ref 0.44–1.00)
GFR calc Af Amer: 57 mL/min — ABNORMAL LOW (ref >60)
GFR calc non Af Amer: 49 mL/min — ABNORMAL LOW (ref >60)
Glucose, Bld: 94 mg/dL (ref 70–99)
Potassium: 3.1 mmol/L — ABNORMAL LOW (ref 3.5–5.1)
Sodium: 137 mmol/L (ref 135–145)
Total Bilirubin: 0.6 mg/dL (ref 0.3–1.2)
Total Protein: 6.6 g/dL (ref 6.5–8.1)

## 2019-04-27 LAB — TYPE AND SCREEN
ABO/RH(D): A POS
Antibody Screen: NEGATIVE

## 2019-04-27 SURGERY — ARTHROPLASTY, HIP, TOTAL, ANTERIOR APPROACH
Anesthesia: Spinal | Site: Hip | Laterality: Left

## 2019-04-27 MED ORDER — FLEET ENEMA 7-19 GM/118ML RE ENEM: 1.0000 | ENEMA | Freq: Once | RECTAL | Status: DC | PRN

## 2019-04-27 MED ORDER — DEXAMETHASONE SODIUM PHOSPHATE 10 MG/ML IJ SOLN
10.0000 mg | Freq: Once | INTRAMUSCULAR | Status: AC
  Administered 2019-04-28: 10 mg via INTRAVENOUS
  Filled 2019-04-27: qty 1

## 2019-04-27 MED ORDER — PHENOL 1.4 % MT LIQD: 1.0000 | OROMUCOSAL | Status: DC | PRN

## 2019-04-27 MED ORDER — ASPIRIN EC 325 MG PO TBEC
325.0000 mg | DELAYED_RELEASE_TABLET | Freq: Two times a day (BID) | ORAL | Status: DC
  Administered 2019-04-28 – 2019-04-30 (×5): 325 mg via ORAL
  Filled 2019-04-27 (×5): qty 1

## 2019-04-27 MED ORDER — FENTANYL CITRATE (PF) 100 MCG/2ML IJ SOLN
INTRAMUSCULAR | Status: DC | PRN
  Administered 2019-04-27: 100 ug via INTRAVENOUS
  Administered 2019-04-27 (×6): 50 ug via INTRAVENOUS

## 2019-04-27 MED ORDER — ROCURONIUM BROMIDE 10 MG/ML (PF) SYRINGE
PREFILLED_SYRINGE | INTRAVENOUS | Status: AC
  Filled 2019-04-27: qty 10

## 2019-04-27 MED ORDER — CEFAZOLIN SODIUM-DEXTROSE 2-4 GM/100ML-% IV SOLN
2.0000 g | INTRAVENOUS | Status: AC
  Administered 2019-04-27: 2 g via INTRAVENOUS
  Filled 2019-04-27: qty 100

## 2019-04-27 MED ORDER — SODIUM CHLORIDE 0.9 % IV SOLN
INTRAVENOUS | Status: DC
  Administered 2019-04-27 – 2019-04-28 (×2): via INTRAVENOUS

## 2019-04-27 MED ORDER — ACETAMINOPHEN 10 MG/ML IV SOLN
1000.0000 mg | Freq: Four times a day (QID) | INTRAVENOUS | Status: DC
  Administered 2019-04-27: 1000 mg via INTRAVENOUS
  Filled 2019-04-27: qty 100

## 2019-04-27 MED ORDER — OXYCODONE HCL 5 MG PO TABS
10.0000 mg | ORAL_TABLET | ORAL | Status: DC | PRN
  Administered 2019-04-28: 10 mg via ORAL
  Administered 2019-04-28 – 2019-04-30 (×9): 15 mg via ORAL
  Filled 2019-04-27 (×5): qty 3
  Filled 2019-04-27: qty 2
  Filled 2019-04-27 (×4): qty 3
  Filled 2019-04-27: qty 2

## 2019-04-27 MED ORDER — METOCLOPRAMIDE HCL 5 MG/ML IJ SOLN: 5.0000 mg | Freq: Three times a day (TID) | INTRAMUSCULAR | Status: DC | PRN

## 2019-04-27 MED ORDER — ACETAMINOPHEN 325 MG PO TABS
325.0000 mg | ORAL_TABLET | Freq: Four times a day (QID) | ORAL | Status: DC | PRN
  Administered 2019-04-29: 650 mg via ORAL
  Filled 2019-04-27: qty 2

## 2019-04-27 MED ORDER — DORZOLAMIDE HCL-TIMOLOL MAL 2-0.5 % OP SOLN
1.0000 [drp] | Freq: Two times a day (BID) | OPHTHALMIC | Status: DC
  Administered 2019-04-28 – 2019-04-30 (×4): 1 [drp] via OPHTHALMIC
  Filled 2019-04-27: qty 10

## 2019-04-27 MED ORDER — ONDANSETRON HCL 4 MG/2ML IJ SOLN
INTRAMUSCULAR | Status: DC | PRN
  Administered 2019-04-27: 4 mg via INTRAVENOUS

## 2019-04-27 MED ORDER — ACETAMINOPHEN 500 MG PO TABS
1000.0000 mg | ORAL_TABLET | Freq: Four times a day (QID) | ORAL | Status: AC
  Administered 2019-04-27 – 2019-04-28 (×4): 1000 mg via ORAL
  Filled 2019-04-27 (×4): qty 2

## 2019-04-27 MED ORDER — FENTANYL CITRATE (PF) 100 MCG/2ML IJ SOLN
INTRAMUSCULAR | Status: AC
  Filled 2019-04-27: qty 2

## 2019-04-27 MED ORDER — ONDANSETRON HCL 4 MG/2ML IJ SOLN: 4.0000 mg | Freq: Four times a day (QID) | INTRAMUSCULAR | Status: DC | PRN

## 2019-04-27 MED ORDER — METHOCARBAMOL 500 MG PO TABS
500.0000 mg | ORAL_TABLET | Freq: Four times a day (QID) | ORAL | Status: DC | PRN
  Administered 2019-04-27 – 2019-04-30 (×7): 500 mg via ORAL
  Filled 2019-04-27 (×7): qty 1

## 2019-04-27 MED ORDER — SIMVASTATIN 20 MG PO TABS
20.0000 mg | ORAL_TABLET | Freq: Every day | ORAL | Status: DC
  Administered 2019-04-27 – 2019-04-29 (×3): 20 mg via ORAL
  Filled 2019-04-27 (×3): qty 1

## 2019-04-27 MED ORDER — BUPIVACAINE HCL (PF) 0.25 % IJ SOLN
INTRAMUSCULAR | Status: AC
  Filled 2019-04-27: qty 30

## 2019-04-27 MED ORDER — DIPHENHYDRAMINE HCL 12.5 MG/5ML PO ELIX: 12.5000 mg | ORAL_SOLUTION | ORAL | Status: DC | PRN

## 2019-04-27 MED ORDER — FUROSEMIDE 40 MG PO TABS
40.0000 mg | ORAL_TABLET | Freq: Every day | ORAL | Status: DC
  Administered 2019-04-30: 40 mg via ORAL
  Filled 2019-04-27: qty 1

## 2019-04-27 MED ORDER — WATER FOR IRRIGATION, STERILE IR SOLN
Status: DC | PRN
  Administered 2019-04-27: 2000 mL

## 2019-04-27 MED ORDER — LABETALOL HCL 5 MG/ML IV SOLN
INTRAVENOUS | Status: AC
  Filled 2019-04-27: qty 4

## 2019-04-27 MED ORDER — SUGAMMADEX SODIUM 200 MG/2ML IV SOLN
INTRAVENOUS | Status: DC | PRN
  Administered 2019-04-27: 150 mg via INTRAVENOUS

## 2019-04-27 MED ORDER — HYDROMORPHONE HCL 1 MG/ML IJ SOLN
0.5000 mg | INTRAMUSCULAR | Status: DC | PRN
  Administered 2019-04-27: 0.5 mg via INTRAVENOUS
  Administered 2019-04-28 – 2019-04-29 (×3): 1 mg via INTRAVENOUS
  Filled 2019-04-27 (×4): qty 1

## 2019-04-27 MED ORDER — CEFAZOLIN SODIUM-DEXTROSE 2-4 GM/100ML-% IV SOLN
2.0000 g | Freq: Four times a day (QID) | INTRAVENOUS | Status: AC
  Administered 2019-04-27 – 2019-04-28 (×2): 2 g via INTRAVENOUS
  Filled 2019-04-27 (×2): qty 100

## 2019-04-27 MED ORDER — ONDANSETRON HCL 4 MG/2ML IJ SOLN: 4.0000 mg | Freq: Once | INTRAMUSCULAR | Status: DC | PRN

## 2019-04-27 MED ORDER — POLYETHYLENE GLYCOL 3350 17 G PO PACK
17.0000 g | PACK | Freq: Every day | ORAL | Status: DC | PRN
  Administered 2019-04-30: 17 g via ORAL
  Filled 2019-04-27: qty 1

## 2019-04-27 MED ORDER — METHOCARBAMOL 500 MG IVPB - SIMPLE MED
INTRAVENOUS | Status: AC
  Filled 2019-04-27: qty 50

## 2019-04-27 MED ORDER — TRAZODONE HCL 50 MG PO TABS
50.0000 mg | ORAL_TABLET | Freq: Every day | ORAL | Status: DC
  Administered 2019-04-27 – 2019-04-29 (×3): 50 mg via ORAL
  Filled 2019-04-27 (×3): qty 1

## 2019-04-27 MED ORDER — BUPIVACAINE HCL 0.25 % IJ SOLN
INTRAMUSCULAR | Status: DC | PRN
  Administered 2019-04-27: 30 mL

## 2019-04-27 MED ORDER — OXYCODONE HCL 5 MG PO TABS
5.0000 mg | ORAL_TABLET | ORAL | Status: DC | PRN
  Administered 2019-04-27: 5 mg via ORAL
  Administered 2019-04-27 – 2019-04-29 (×5): 10 mg via ORAL
  Administered 2019-04-29: 5 mg via ORAL
  Filled 2019-04-27 (×4): qty 2
  Filled 2019-04-27: qty 1
  Filled 2019-04-27: qty 2
  Filled 2019-04-27 (×2): qty 1

## 2019-04-27 MED ORDER — KETOROLAC TROMETHAMINE 15 MG/ML IJ SOLN
15.0000 mg | Freq: Once | INTRAMUSCULAR | Status: AC | PRN
  Administered 2019-04-27: 15 mg via INTRAVENOUS

## 2019-04-27 MED ORDER — SUGAMMADEX SODIUM 500 MG/5ML IV SOLN
INTRAVENOUS | Status: AC
  Filled 2019-04-27: qty 5

## 2019-04-27 MED ORDER — METOCLOPRAMIDE HCL 5 MG PO TABS: 5.0000 mg | ORAL_TABLET | Freq: Three times a day (TID) | ORAL | Status: DC | PRN

## 2019-04-27 MED ORDER — HYDROCHLOROTHIAZIDE 25 MG PO TABS
25.0000 mg | ORAL_TABLET | Freq: Every day | ORAL | Status: DC
  Filled 2019-04-27: qty 1

## 2019-04-27 MED ORDER — FENTANYL CITRATE (PF) 100 MCG/2ML IJ SOLN
25.0000 ug | INTRAMUSCULAR | Status: DC | PRN
  Administered 2019-04-27 (×3): 50 ug via INTRAVENOUS

## 2019-04-27 MED ORDER — FENOFIBRATE 160 MG PO TABS
160.0000 mg | ORAL_TABLET | Freq: Every day | ORAL | Status: DC
  Administered 2019-04-28 – 2019-04-30 (×3): 160 mg via ORAL
  Filled 2019-04-27 (×3): qty 1

## 2019-04-27 MED ORDER — ONDANSETRON HCL 4 MG PO TABS: 4.0000 mg | ORAL_TABLET | Freq: Four times a day (QID) | ORAL | Status: DC | PRN

## 2019-04-27 MED ORDER — BISACODYL 10 MG RE SUPP: 10.0000 mg | Freq: Every day | RECTAL | Status: DC | PRN

## 2019-04-27 MED ORDER — PROPOFOL 500 MG/50ML IV EMUL
INTRAVENOUS | Status: AC
  Filled 2019-04-27: qty 50

## 2019-04-27 MED ORDER — 0.9 % SODIUM CHLORIDE (POUR BTL) OPTIME
TOPICAL | Status: DC | PRN
  Administered 2019-04-27: 1000 mL

## 2019-04-27 MED ORDER — KETOROLAC TROMETHAMINE 15 MG/ML IJ SOLN
INTRAMUSCULAR | Status: AC
  Filled 2019-04-27: qty 1

## 2019-04-27 MED ORDER — ROCURONIUM BROMIDE 50 MG/5ML IV SOSY
PREFILLED_SYRINGE | INTRAVENOUS | Status: DC | PRN
  Administered 2019-04-27: 40 mg via INTRAVENOUS

## 2019-04-27 MED ORDER — TRANEXAMIC ACID-NACL 1000-0.7 MG/100ML-% IV SOLN
1000.0000 mg | INTRAVENOUS | Status: AC
  Administered 2019-04-27: 1000 mg via INTRAVENOUS
  Filled 2019-04-27: qty 100

## 2019-04-27 MED ORDER — POVIDONE-IODINE 10 % EX SWAB
2.0000 "application " | Freq: Once | CUTANEOUS | Status: AC
  Administered 2019-04-27: 2 via TOPICAL

## 2019-04-27 MED ORDER — PROPOFOL 10 MG/ML IV BOLUS
INTRAVENOUS | Status: AC
  Filled 2019-04-27: qty 20

## 2019-04-27 MED ORDER — PROPOFOL 10 MG/ML IV BOLUS
INTRAVENOUS | Status: DC | PRN
  Administered 2019-04-27: 150 mg via INTRAVENOUS

## 2019-04-27 MED ORDER — DOCUSATE SODIUM 100 MG PO CAPS
100.0000 mg | ORAL_CAPSULE | Freq: Two times a day (BID) | ORAL | Status: DC
  Administered 2019-04-27 – 2019-04-30 (×6): 100 mg via ORAL
  Filled 2019-04-27 (×6): qty 1

## 2019-04-27 MED ORDER — LEVOTHYROXINE SODIUM 88 MCG PO TABS
88.0000 ug | ORAL_TABLET | Freq: Every day | ORAL | Status: DC
  Administered 2019-04-28 – 2019-04-30 (×3): 88 ug via ORAL
  Filled 2019-04-27 (×3): qty 1

## 2019-04-27 MED ORDER — DEXAMETHASONE SODIUM PHOSPHATE 10 MG/ML IJ SOLN
8.0000 mg | Freq: Once | INTRAMUSCULAR | Status: AC
  Administered 2019-04-27: 8 mg via INTRAVENOUS

## 2019-04-27 MED ORDER — MENTHOL 3 MG MT LOZG: 1.0000 | LOZENGE | OROMUCOSAL | Status: DC | PRN

## 2019-04-27 MED ORDER — POTASSIUM CHLORIDE CRYS ER 20 MEQ PO TBCR
60.0000 meq | EXTENDED_RELEASE_TABLET | Freq: Every day | ORAL | Status: DC
  Administered 2019-04-28 – 2019-04-30 (×3): 60 meq via ORAL
  Filled 2019-04-27 (×4): qty 3

## 2019-04-27 MED ORDER — LACTATED RINGERS IV SOLN
INTRAVENOUS | Status: DC
  Administered 2019-04-27: 13:00:00 via INTRAVENOUS

## 2019-04-27 MED ORDER — CHLORHEXIDINE GLUCONATE 4 % EX LIQD: 60.0000 mL | Freq: Once | CUTANEOUS | Status: DC

## 2019-04-27 MED ORDER — METHOCARBAMOL 500 MG IVPB - SIMPLE MED
500.0000 mg | Freq: Four times a day (QID) | INTRAVENOUS | Status: DC | PRN
  Administered 2019-04-27: 500 mg via INTRAVENOUS
  Filled 2019-04-27: qty 50

## 2019-04-27 MED ORDER — LIDOCAINE 2% (20 MG/ML) 5 ML SYRINGE
INTRAMUSCULAR | Status: DC | PRN
  Administered 2019-04-27: 50 mg via INTRAVENOUS

## 2019-04-27 SURGICAL SUPPLY — 51 items
BAG DECANTER FOR FLEXI CONT (MISCELLANEOUS) IMPLANT
BAG ZIPLOCK 12X15 (MISCELLANEOUS) IMPLANT
BALL HIP CERAMIC (Hips) ×1 IMPLANT
BLADE SAG 18X100X1.27 (BLADE) ×3 IMPLANT
CLOSURE STERI-STRIP 1/2X4 (GAUZE/BANDAGES/DRESSINGS) ×2
CLOSURE WOUND 1/2 X4 (GAUZE/BANDAGES/DRESSINGS) ×1
CLSR STERI-STRIP ANTIMIC 1/2X4 (GAUZE/BANDAGES/DRESSINGS) ×4 IMPLANT
COVER PERINEAL POST (MISCELLANEOUS) ×3 IMPLANT
COVER SURGICAL LIGHT HANDLE (MISCELLANEOUS) ×3 IMPLANT
COVER WAND RF STERILE (DRAPES) ×3 IMPLANT
CUP ACET PINNACLE SECTR 50MM (Hips) ×1 IMPLANT
DECANTER SPIKE VIAL GLASS SM (MISCELLANEOUS) ×3 IMPLANT
DRAPE STERI IOBAN 125X83 (DRAPES) ×3 IMPLANT
DRAPE U-SHAPE 47X51 STRL (DRAPES) ×6 IMPLANT
DRSG ADAPTIC 3X8 NADH LF (GAUZE/BANDAGES/DRESSINGS) ×3 IMPLANT
DRSG MEPILEX BORDER 4X4 (GAUZE/BANDAGES/DRESSINGS) ×3 IMPLANT
DRSG MEPILEX BORDER 4X8 (GAUZE/BANDAGES/DRESSINGS) ×3 IMPLANT
DURAPREP 26ML APPLICATOR (WOUND CARE) ×3 IMPLANT
ELECT REM PT RETURN 15FT ADLT (MISCELLANEOUS) ×3 IMPLANT
EVACUATOR 1/8 PVC DRAIN (DRAIN) ×3 IMPLANT
GAUZE SPONGE 4X4 12PLY STRL (GAUZE/BANDAGES/DRESSINGS) ×3 IMPLANT
GLOVE BIO SURGEON STRL SZ 6 (GLOVE) ×3 IMPLANT
GLOVE BIO SURGEON STRL SZ7 (GLOVE) IMPLANT
GLOVE BIO SURGEON STRL SZ8 (GLOVE) ×3 IMPLANT
GLOVE BIOGEL PI IND STRL 6.5 (GLOVE) ×1 IMPLANT
GLOVE BIOGEL PI IND STRL 7.0 (GLOVE) IMPLANT
GLOVE BIOGEL PI IND STRL 8 (GLOVE) ×1 IMPLANT
GLOVE BIOGEL PI INDICATOR 6.5 (GLOVE) ×2
GLOVE BIOGEL PI INDICATOR 7.0 (GLOVE)
GLOVE BIOGEL PI INDICATOR 8 (GLOVE) ×2
GOWN STRL REUS W/TWL LRG LVL3 (GOWN DISPOSABLE) ×3 IMPLANT
GOWN STRL REUS W/TWL XL LVL3 (GOWN DISPOSABLE) IMPLANT
HEAD FEMORAL 32 CERAMIC (Hips) IMPLANT
HIP BALL CERAMIC (Hips) ×3 IMPLANT
HOLDER FOLEY CATH W/STRAP (MISCELLANEOUS) ×3 IMPLANT
KIT TURNOVER KIT A (KITS) IMPLANT
LINER MARATHON 32 50 (Hips) ×3 IMPLANT
MANIFOLD NEPTUNE II (INSTRUMENTS) ×3 IMPLANT
PACK ANTERIOR HIP CUSTOM (KITS) ×3 IMPLANT
PINNACLE SECTOR CUP 50MM (Hips) ×3 IMPLANT
STEM FEMORAL SZ 5MM STD ACTIS (Stem) ×3 IMPLANT
STRIP CLOSURE SKIN 1/2X4 (GAUZE/BANDAGES/DRESSINGS) ×2 IMPLANT
SUT ETHIBOND NAB CT1 #1 30IN (SUTURE) ×3 IMPLANT
SUT MNCRL AB 4-0 PS2 18 (SUTURE) ×3 IMPLANT
SUT STRATAFIX 0 PDS 27 VIOLET (SUTURE) ×3
SUT VIC AB 2-0 CT1 27 (SUTURE) ×4
SUT VIC AB 2-0 CT1 TAPERPNT 27 (SUTURE) ×2 IMPLANT
SUTURE STRATFX 0 PDS 27 VIOLET (SUTURE) ×1 IMPLANT
SYR 50ML LL SCALE MARK (SYRINGE) IMPLANT
TRAY FOLEY MTR SLVR 14FR STAT (SET/KITS/TRAYS/PACK) ×3 IMPLANT
YANKAUER SUCT BULB TIP 10FT TU (MISCELLANEOUS) ×3 IMPLANT

## 2019-04-27 NOTE — Op Note (Signed)
OPERATIVE REPORT- TOTAL HIP ARTHROPLASTY   PREOPERATIVE DIAGNOSIS: Osteoarthritis of the Left hip.   POSTOPERATIVE DIAGNOSIS: Osteoarthritis of the Left  hip.   PROCEDURE: Left total hip arthroplasty, anterior approach.   SURGEON: Gaynelle Arabian, MD   ASSISTANT: Ardeen Jourdain, PA-C  ANESTHESIA:  General  ESTIMATED BLOOD LOSS:-700 mL    DRAINS: Hemovac x1.   COMPLICATIONS: None   CONDITION: PACU - hemodynamically stable.   BRIEF CLINICAL NOTE: Joan [name] is a 73 y.o. female who has advanced end-  stage arthritis of their Left  hip with progressively worsening pain and  dysfunction.The patient has failed nonoperative management and presents for  total hip arthroplasty.   PROCEDURE IN DETAIL: After successful administration of spinal  anesthetic, the traction boots for the [facility] bed were placed on both  feet and the patient was placed onto the [facility] bed, boots placed into the leg  holders. The Left hip was then isolated from the perineum with plastic  drapes and prepped and draped in the usual sterile fashion. ASIS and  greater trochanter were marked and a oblique incision was made, starting  at about 1 cm lateral and 2 cm distal to the ASIS and coursing towards  the anterior cortex of the femur. The skin was cut with a 10 blade  through subcutaneous tissue to the level of the fascia overlying the  tensor fascia lata muscle. The fascia was then incised in line with the  incision at the junction of the anterior third and posterior 2/3rd. The  muscle was teased off the fascia and then the interval between the TFL  and the rectus was developed. The Hohmann retractor was then placed at  the top of the femoral neck over the capsule. The vessels overlying the  capsule were cauterized and the fat on top of the capsule was removed.  A Hohmann retractor was then placed anterior underneath the rectus  femoris to give exposure to the entire anterior capsule. A T-shaped   capsulotomy was performed. The edges were tagged and the femoral head  was identified.       Osteophytes are removed off the superior acetabulum.  The femoral neck was then cut in situ with an oscillating saw. Traction  was then applied to the left lower extremity utilizing the [facility]  traction. The femoral head was then removed. Retractors were placed  around the acetabulum and then circumferential removal of the labrum was  performed. Osteophytes were also removed. Reaming starts at 47 mm to  medialize and  Increased in 2 mm increments to 49 mm. We reamed in  approximately 40 degrees of abduction, 20 degrees anteversion. A 50 mm  pinnacle acetabular shell was then impacted in anatomic position under  fluoroscopic guidance with excellent purchase. We did not need to place  any additional dome screws. A 32 mm neutral + 4 marathon liner was then  placed into the acetabular shell.       The femoral lift was then placed along the lateral aspect of the femur  just distal to the vastus ridge. The leg was  externally rotated and capsule  was stripped off the inferior aspect of the femoral neck down to the  level of the lesser trochanter, this was done with electrocautery. The femur was lifted after this was performed. The  leg was then placed in an extended and adducted position essentially delivering the femur. We also removed the capsule superiorly and the piriformis from the piriformis  fossa to gain excellent exposure of the  proximal femur. Rongeur was used to remove some cancellous bone to get  into the lateral portion of the proximal femur for placement of the  initial starter reamer. The starter broaches was placed  the starter broach  and was shown to go down the center of the canal. Broaching  with the Actis system was then performed starting at size 0  coursing  Up to size 5. A size 5 had excellent torsional and rotational  and axial stability. The trial standard offset neck was then  placed  with a 32 + 5 trial head. The hip was then reduced. We confirmed that  the stem was in the canal both on AP and lateral x-rays. It also has excellent sizing. The hip was reduced with outstanding stability through full extension and full external rotation.. AP pelvis was taken and the leg lengths were measured and found to be equal. Hip was then dislocated again and the femoral head and neck removed. The  femoral broach was removed. Size 5 Actis stem with a standard offset  neck was then impacted into the femur following native anteversion. Has  excellent purchase in the canal. Excellent torsional and rotational and  axial stability. It is confirmed to be in the canal on AP and lateral  fluoroscopic views. The 32 + 5 ceramic head was placed and the hip  reduced with outstanding stability. Again AP pelvis was taken and it  confirmed that the leg lengths were equal. The wound was then copiously  irrigated with saline solution and the capsule reattached and repaired  with Ethibond suture. 30 ml of .25% Bupivicaine was  injected into the capsule and into the edge of the tensor fascia lata as well as subcutaneous tissue. The fascia overlying the tensor fascia lata was then closed with a running #1 V-Loc. Subcu was closed with interrupted 2-0 Vicryl and subcuticular running 4-0 Monocryl. Incision was cleaned  and dried. Steri-Strips and a bulky sterile dressing applied. Hemovac  drain was hooked to suction and then the patient was awakened and transported to  recovery in stable condition.        Please note that a surgical assistant was a medical necessity for this procedure to perform it in a safe and expeditious manner. Assistant was necessary to provide appropriate retraction of vital neurovascular structures and to prevent femoral fracture and allow for anatomic placement of the prosthesis.  Gaynelle Arabian, M.D.

## 2019-04-27 NOTE — Discharge Instructions (Addendum)
Dr. Gaynelle Arabian Total Joint Specialist [facility] [address], [place], [place] [address] [phone]  ANTERIOR APPROACH TOTAL HIP REPLACEMENT POSTOPERATIVE DIRECTIONS   Hip Rehabilitation, Guidelines Following Surgery  The results of a hip operation are greatly improved after range of motion and muscle strengthening exercises. Follow all safety measures which are given to protect your hip. If any of these exercises cause increased pain or swelling in your joint, decrease the amount until you are comfortable again. Then slowly increase the exercises. Call your caregiver if you have problems or questions.   HOME CARE INSTRUCTIONS  Remove items at home which could result in a fall. This includes throw rugs or furniture in walking pathways.   ICE to the affected hip every three hours for 30 minutes at a time and then as needed for pain and swelling.  Continue to use ice on the hip for pain and swelling from surgery. You may notice swelling that will progress down to the foot and ankle.  This is normal after surgery.  Elevate the leg when you are not up walking on it.    Continue to use the breathing machine which will help keep your temperature down.  It is common for your temperature to cycle up and down following surgery, especially at night when you are not up moving around and exerting yourself.  The breathing machine keeps your lungs expanded and your temperature down.   DIET You may resume your previous home diet once your are discharged from the hospital.  DRESSING / WOUND CARE / SHOWERING You may shower 3 days after surgery, but keep the wounds dry during showering.  You may use an occlusive plastic wrap (Press'n Seal for example), NO SOAKING/SUBMERGING IN THE BATHTUB.  If the bandage gets wet, change with a clean dry gauze.  If the incision gets wet, pat the wound dry with a clean towel. You may start showering once you are discharged home but do not submerge  the incision under water. Just pat the incision dry and apply a dry gauze dressing on daily. Change the surgical dressing daily and reapply a dry dressing each time.  ACTIVITY Walk with your walker as instructed. Use walker as long as suggested by your caregivers. Avoid periods of inactivity such as sitting longer than an hour when not asleep. This helps prevent blood clots.  You may resume a sexual relationship in one month or when given the OK by your doctor.  You may return to work once you are cleared by your doctor.  Do not drive a car for 6 weeks or until released by you surgeon.  Do not drive while taking narcotics.  WEIGHT BEARING Weight bearing as tolerated with assist device (walker, cane, etc) as directed, use it as long as suggested by your surgeon or therapist, typically at least 4-6 weeks.  POSTOPERATIVE CONSTIPATION PROTOCOL Constipation - defined medically as fewer than three stools per week and severe constipation as less than one stool per week.  One of the most common issues patients have following surgery is constipation.  Even if you have a regular bowel pattern at home, your normal regimen is likely to be disrupted due to multiple reasons following surgery.  Combination of anesthesia, postoperative narcotics, change in appetite and fluid intake all can affect your bowels.  In order to avoid complications following surgery, here are some recommendations in order to help you during your recovery period.  Colace (docusate) - Pick up an over-the-counter  form of Colace or another stool softener and take twice a day as long as you are requiring postoperative pain medications.  Take with a full glass of water daily.  If you experience loose stools or diarrhea, hold the colace until you stool forms back up.  If your symptoms do not get better within 1 week or if they get worse, check with your doctor.  Dulcolax (bisacodyl) - Pick up over-the-counter and take as directed by the  product packaging as needed to assist with the movement of your bowels.  Take with a full glass of water.  Use this product as needed if not relieved by Colace only.   MiraLax (polyethylene glycol) - Pick up over-the-counter to have on hand.  MiraLax is a solution that will increase the amount of water in your bowels to assist with bowel movements.  Take as directed and can mix with a glass of water, juice, soda, coffee, or tea.  Take if you go more than two days without a movement. Do not use MiraLax more than once per day. Call your doctor if you are still constipated or irregular after using this medication for 7 days in a row.  If you continue to have problems with postoperative constipation, please contact the office for further assistance and recommendations.  If you experience "the worst abdominal pain ever" or develop nausea or vomiting, please contact the office immediatly for further recommendations for treatment.  ITCHING  If you experience itching with your medications, try taking only a single pain pill, or even half a pain pill at a time.  You can also use Benadryl over the counter for itching or also to help with sleep.   TED HOSE STOCKINGS Wear the elastic stockings on both legs for three weeks following surgery during the day but you may remove then at night for sleeping.  MEDICATIONS See your medication summary on the After Visit Summary that the nursing staff will review with you prior to discharge.  You may have some home medications which will be placed on hold until you complete the course of blood thinner medication.  It is important for you to complete the blood thinner medication as prescribed by your surgeon.  Continue your approved medications as instructed at time of discharge.  Continue prednisone taper as prescribed in the hospital and given to your by RN  PRECAUTIONS If you experience chest pain or shortness of breath - call 911 immediately for transfer to the  hospital emergency department.  If you develop a fever greater that 101 F, purulent drainage from wound, increased redness or drainage from wound, foul odor from the wound/dressing, or calf pain - CONTACT YOUR SURGEON.                                                   FOLLOW-UP APPOINTMENTS Make sure you keep all of your appointments after your operation with your surgeon and caregivers. You should call the office at the above phone number and make an appointment for approximately two weeks after the date of your surgery or on the date instructed by your surgeon outlined in the "After Visit Summary".  RANGE OF MOTION AND STRENGTHENING EXERCISES  These exercises are designed to help you keep full movement of your hip joint. Follow your caregiver's or physical therapist's instructions. Perform all exercises about  fifteen times, three times per day or as directed. Exercise both hips, even if you have had only one joint replacement. These exercises can be done on a training (exercise) mat, on the floor, on a table or on a bed. Use whatever works the best and is most comfortable for you. Use music or television while you are exercising so that the exercises are a pleasant break in your day. This will make your life better with the exercises acting as a break in routine you can look forward to.  Lying on your back, slowly slide your foot toward your buttocks, raising your knee up off the floor. Then slowly slide your foot back down until your leg is straight again.  Lying on your back spread your legs as far apart as you can without causing discomfort.  Lying on your side, raise your upper leg and foot straight up from the floor as far as is comfortable. Slowly lower the leg and repeat.  Lying on your back, tighten up the muscle in the front of your thigh (quadriceps muscles). You can do this by keeping your leg straight and trying to raise your heel off the floor. This helps strengthen the largest muscle  supporting your knee.  Lying on your back, tighten up the muscles of your buttocks both with the legs straight and with the knee bent at a comfortable angle while keeping your heel on the floor.   IF YOU ARE TRANSFERRED TO A SKILLED REHAB FACILITY If the patient is transferred to a skilled rehab facility following release from the hospital, a list of the current medications will be sent to the facility for the patient to continue.  When discharged from the skilled rehab facility, please have the facility set up the patient's [place] prior to being released. Also, the skilled facility will be responsible for providing the patient with their medications at time of release from the facility to include their pain medication, the muscle relaxants, and their blood thinner medication. If the patient is still at the rehab facility at time of the two week follow up appointment, the skilled rehab facility will also need to assist the patient in arranging follow up appointment in our office and any transportation needs.  MAKE SURE YOU:  Understand these instructions.  Get help right away if you are not doing well or get worse.    Pick up stool softner and laxative for home use following surgery while on pain medications. Do not submerge incision under water. Please use good hand washing techniques while changing dressing each day. May shower starting three days after surgery. Please use a clean towel to pat the incision dry following showers. Continue to use ice for pain and swelling after surgery. Do not use any lotions or creams on the incision until instructed by your surgeon.

## 2019-04-27 NOTE — Plan of Care (Signed)
Plan of care 

## 2019-04-27 NOTE — Anesthesia Postprocedure Evaluation (Signed)
Anesthesia Post Note  Patient: KIEL EQUIHUA  Procedure(s) Performed: TOTAL HIP ARTHROPLASTY ANTERIOR APPROACH (Left Hip)     Patient location during evaluation: PACU Anesthesia Type: General Level of consciousness: awake and alert Pain management: pain level controlled Vital Signs Assessment: post-procedure vital signs reviewed and stable Respiratory status: spontaneous breathing, nonlabored ventilation, respiratory function stable and patient connected to nasal cannula oxygen Cardiovascular status: blood pressure returned to baseline and stable Postop Assessment: no apparent nausea or vomiting Anesthetic complications: no    Last Vitals:  Vitals:   04/27/19 1745 04/27/19 1801  BP: 117/62 124/71  Pulse: 93 83  Resp: 16 16  Temp: (!) 36.3 C 36.6 C  SpO2: 100% 100%    Last Pain:  Vitals:   04/27/19 1801  TempSrc: Axillary  PainSc:                  Karyl Kinnier Swati Granberry

## 2019-04-27 NOTE — Care Plan (Signed)
Ortho Bundle Case Management Note  Patient Details  Name: Joan [name] MRN: [id number] Date of Birth: 1946-05-03  L THA on 04-27-19 DCP:  Home with son.  1 story home with 1 ste. DME:  No needs.  Has a RW, elevated toilets and a grab bar. PT: HEP                   DME Arranged:  N/A DME Agency:  NA  HH Arranged:  NA HH Agency:  NA  Additional Comments: Please contact me with any questions of if this plan should need to change.  Marianne Sofia, RN,CCM [facility]  [phone] 04/27/2019, 1:30 PM

## 2019-04-27 NOTE — Plan of Care (Signed)
Plan of care, including pain management, Incentive Spirometry, discussed and reviewed with the patient.

## 2019-04-27 NOTE — Interval H&P Note (Signed)
History and Physical Interval Note:  04/27/2019 1:28 PM  Joan [name]  has presented today for surgery, with the diagnosis of Left hip osteoarthritis.  The various methods of treatment have been discussed with the patient and family. After consideration of risks, benefits and other options for treatment, the patient has consented to  Procedure(s) with comments: [place] (Left) - 172min as a surgical intervention.  The patient's history has been reviewed, patient examined, no change in status, stable for surgery.  I have reviewed the patient's chart and labs.  Questions were answered to the patient's satisfaction.     Pilar Plate Alejo Beamer

## 2019-04-27 NOTE — Anesthesia Procedure Notes (Signed)

## 2019-04-27 NOTE — Transfer of Care (Signed)
Immediate Anesthesia Transfer of Care Note  Patient: Joan [name]  Procedure(s) Performed: Procedure(s) with comments: TOTAL HIP ARTHROPLASTY ANTERIOR APPROACH (Left) - 175min  Patient Location: PACU  Anesthesia Type:General  Level of Consciousness:  sedated, patient cooperative and responds to stimulation  Airway & Oxygen Therapy:Patient Spontanous Breathing and Patient connected to face mask oxgen  Post-op Assessment:  Report given to PACU RN and Post -op Vital signs reviewed and stable  Post vital signs:  Reviewed and stable  Last Vitals:  Vitals:   04/27/19 1244 04/27/19 1647  BP: 132/62   Pulse: 65 88  Resp: 18 14  Temp: 37 C 36.4 C  SpO2: [id number]    Complications: No apparent anesthesia complications

## 2019-04-28 ENCOUNTER — Encounter (HOSPITAL_COMMUNITY): Payer: Self-pay | Admitting: Orthopedic Surgery

## 2019-04-28 LAB — CBC
HCT: 27.4 % — ABNORMAL LOW (ref 36.0–46.0)
Hemoglobin: 8.9 g/dL — ABNORMAL LOW (ref 12.0–15.0)
MCH: 30.5 pg (ref 26.0–34.0)
MCHC: 32.5 g/dL (ref 30.0–36.0)
MCV: 93.8 fL (ref 80.0–100.0)
Platelets: 171 10*3/uL (ref 150–400)
RBC: 2.92 MIL/uL — ABNORMAL LOW (ref 3.87–5.11)
RDW: 12.7 % (ref 11.5–15.5)
WBC: 7.6 10*3/uL (ref 4.0–10.5)
nRBC: 0 % (ref 0.0–0.2)

## 2019-04-28 LAB — BASIC METABOLIC PANEL
Anion gap: 7 (ref 5–15)
BUN: 16 mg/dL (ref 8–23)
CO2: 26 mmol/L (ref 22–32)
Calcium: 7.5 mg/dL — ABNORMAL LOW (ref 8.9–10.3)
Chloride: 101 mmol/L (ref 98–111)
Creatinine, Ser: 1.06 mg/dL — ABNORMAL HIGH (ref 0.44–1.00)
GFR calc Af Amer: 60 mL/min — ABNORMAL LOW (ref >60)
GFR calc non Af Amer: 52 mL/min — ABNORMAL LOW (ref >60)
Glucose, Bld: 205 mg/dL — ABNORMAL HIGH (ref 70–99)
Potassium: 3.4 mmol/L — ABNORMAL LOW (ref 3.5–5.1)
Sodium: 134 mmol/L — ABNORMAL LOW (ref 135–145)

## 2019-04-28 MED ORDER — SODIUM CHLORIDE 0.9 % IV BOLUS
250.0000 mL | Freq: Once | INTRAVENOUS | Status: AC
  Administered 2019-04-28: 250 mL via INTRAVENOUS

## 2019-04-28 NOTE — Progress Notes (Signed)
Alert x4, assessment had not been charted yet. Md notified of bp.

## 2019-04-28 NOTE — Progress Notes (Signed)
Physical Therapy Treatment Patient Details Name: Joan [name] MRN: [id number] DOB: October 13, 1945 Today's Date: 04/28/2019    History of Present Illness 73 yo female s/p L THA-direct anterior 10/19.    PT Comments    Progressing slowly with mobility. Activity limited by pain and fatigue. Pt c/o some lightheadedness towards end of session. Will continue to progress activity as tolerated.    Follow Up Recommendations  Follow surgeon's recommendation for DC plan and follow-up therapies;Supervision/Assistance - 24 hour (may need HHPT)     Equipment Recommendations  3in1 (PT)    Recommendations for Other Services       Precautions / Restrictions Precautions Precautions: Fall Restrictions Weight Bearing Restrictions: No Other Position/Activity Restrictions: WBAT    Mobility  Bed Mobility Overal bed mobility: Needs Assistance             General bed mobility comments: Assist for L LE. Increased time. Pt relied heavily on bedrail  Transfers Overall transfer level: Needs assistance Equipment used: Rolling walker (2 wheeled) Transfers: Sit to/from Stand Sit to Stand: Min assist         General transfer comment: Assist to rise, stabilize control descent. VCs safety, technique, hand placement. Increased time.  Ambulation/Gait Ambulation/Gait assistance: Min assist Gait Distance (Feet): 40 Feet   Gait Pattern/deviations: Step-to pattern;Antalgic;Trunk flexed     General Gait Details: VCs safety, technique, sequence, distance from RW, posture. Assist to stabilize pt throughout distance. Several standing rest breaks due to fatigue, pain.   Stairs             Wheelchair Mobility    Modified Rankin (Stroke Patients Only)       Balance Overall balance assessment: Needs assistance         Standing balance support: Bilateral upper extremity supported Standing balance-Leahy Scale: Poor                              Cognition  Arousal/Alertness: Awake/alert Behavior During Therapy: WFL for tasks assessed/performed Overall Cognitive Status: Within Functional Limits for tasks assessed                                        Exercises      General Comments        Pertinent Vitals/Pain Pain Assessment: 0-10 Pain Score: 8  Pain Location: L hip/thigh Pain Descriptors / Indicators: Burning;Sharp;Radiating Pain Intervention(s): Monitored during session;Repositioned    Home Living                      Prior Function            PT Goals (current goals can now be found in the care plan section) Progress towards PT goals: Progressing toward goals    Frequency    7X/week      PT Plan Current plan remains appropriate    Co-evaluation              AM-PAC PT "6 Clicks" Mobility   Outcome Measure  Help needed turning from your back to your side while in a flat bed without using bedrails?: A Lot Help needed moving from lying on your back to sitting on the side of a flat bed without using bedrails?: A Little Help needed moving to and from a bed to a chair (including a wheelchair)?: A Little Help  needed standing up from a chair using your arms (e.g., wheelchair or bedside chair)?: A Little Help needed to walk in hospital room?: A Little Help needed climbing 3-5 steps with a railing? : A Lot 6 Click Score: 16    End of Session Equipment Utilized During Treatment: Gait belt Activity Tolerance: Patient limited by fatigue;Patient limited by pain Patient left: in chair;with call bell/phone within reach   PT Visit Diagnosis: Pain;Other abnormalities of gait and mobility (R26.89) Pain - Right/Left: Left Pain - part of body: Hip;Leg     Time: [id number] PT Time Calculation (min) (ACUTE ONLY): 29 min  Charges:  $Gait Training: 8-22 mins $Therapeutic Activity: 8-22 mins                       Weston Anna, PT Acute Rehabilitation Services Pager: [phone] Office:  [phone]

## 2019-04-28 NOTE — Evaluation (Signed)
Physical Therapy Evaluation Patient Details Name: Joan [name] MRN: [id number] DOB: 1945/09/28 Today's Date: 04/28/2019   History of Present Illness  73 yo female s/p L THA-direct anterior 10/19.  Clinical Impression  Bed eval only this a.m. RN reported pt had low BP readings. Will defer OOB with PT until this afternoon. Pt performed a few ROM exercises in bed but activity as limited by pain. Plan is for d/c home with HEP when ready.     Follow Up Recommendations Follow surgeon's recommendation for DC plan and follow-up therapies    Equipment Recommendations  None recommended by PT    Recommendations for Other Services       Precautions / Restrictions Precautions Precautions: Fall Restrictions Weight Bearing Restrictions: No Other Position/Activity Restrictions: WBAT      Mobility  Bed Mobility               General bed mobility comments: NT-RN reported pt has low BP this am. Will defer OOB until this afternoon  Transfers                    Ambulation/Gait                Stairs            Wheelchair Mobility    Modified Rankin (Stroke Patients Only)       Balance                                             Pertinent Vitals/Pain Pain Assessment: 0-10 Pain Score: 8  Pain Location: L hip Pain Descriptors / Indicators: Aching;Sore;Discomfort;Grimacing Pain Intervention(s): Patient requesting pain meds-RN notified;Ice applied    Home Living Family/patient expects to be discharged to:: Private residence Living Arrangements: Alone Available Help at Discharge: Family(son will stay for a few days) Type of Home: Apartment Home Access: Stairs to enter Entrance Stairs-Rails: None Entrance Stairs-Number of Steps: 1 Home Layout: One level Home Equipment: Grab bars - tub/shower;Grab bars - toilet      Prior Function Level of Independence: Independent               Hand Dominance         Extremity/Trunk Assessment   Upper Extremity Assessment Upper Extremity Assessment: Generalized weakness    Lower Extremity Assessment Lower Extremity Assessment: Generalized weakness    Cervical / Trunk Assessment Cervical / Trunk Assessment: Normal  Communication   Communication: No difficulties  Cognition Arousal/Alertness: Awake/alert Behavior During Therapy: WFL for tasks assessed/performed Overall Cognitive Status: Within Functional Limits for tasks assessed                                        General Comments      Exercises Total Joint Exercises Ankle Circles/Pumps: AROM;Both;10 reps;Supine Quad Sets: AROM;Left;5 reps;Supine Heel Slides: AAROM;Left;5 reps;Supine   Assessment/Plan    PT Assessment Patient needs continued PT services  PT Problem List Decreased strength;Decreased range of motion;Decreased mobility;Decreased balance;Decreased activity tolerance;Decreased knowledge of use of DME;Pain       PT Treatment Interventions DME instruction;Gait training;Therapeutic exercise;Therapeutic activities;Patient/family education;Balance training;Functional mobility training;Stair training    PT Goals (Current goals can be found in the Care Plan section)  Acute Rehab PT Goals Patient Stated Goal: less pain.  OOB. PT Goal Formulation: With patient Time For Goal Achievement: 05/12/19 Potential to Achieve Goals: Good    Frequency 7X/week   Barriers to discharge        Co-evaluation               AM-PAC PT "6 Clicks" Mobility  Outcome Measure Help needed turning from your back to your side while in a flat bed without using bedrails?: A Lot Help needed moving from lying on your back to sitting on the side of a flat bed without using bedrails?: A Lot Help needed moving to and from a bed to a chair (including a wheelchair)?: A Lot Help needed standing up from a chair using your arms (e.g., wheelchair or bedside chair)?: A Lot Help needed  to walk in hospital room?: A Lot Help needed climbing 3-5 steps with a railing? : A Lot 6 Click Score: 12    End of Session   Activity Tolerance: Patient limited by pain Patient left: in bed;with call bell/phone within reach   PT Visit Diagnosis: Pain;Other abnormalities of gait and mobility (R26.89) Pain - Right/Left: Left Pain - part of body: Hip    Time: [id number] PT Time Calculation (min) (ACUTE ONLY): 15 min   Charges:   PT Evaluation $PT Eval Low Complexity: [place], PT Acute Rehabilitation Services Pager: [phone] Office: [phone]

## 2019-04-28 NOTE — Progress Notes (Signed)
Subjective: 1 Day Post-Op Procedure(s) (LRB): TOTAL HIP ARTHROPLASTY ANTERIOR APPROACH (Left) Patient reports pain as mild.   Patient seen in rounds by Dr. Wynelle Link. Patient is well, and has had no acute complaints or problems other than discomfort in the left hip. No acute events overnight. Voiding without difficulty, positive flatus. Denies CP, SHOB. We will start therapy today.   Objective: Vital signs in last 24 hours: Temp:  [97.4 F (36.3 C)-98.6 F (37 C)] 98.6 F (37 C) (10/20 0541) Pulse Rate:  [65-93] 75 (10/20 0541) Resp:  [8-18] 17 (10/20 0541) BP: (100-164)/(54-106) 101/55 (10/20 0541) SpO2:  [95 %-100 %] 99 % (10/20 0541) Weight:  [76.7 kg] 76.7 kg (10/19 1801)  Intake/Output from previous day:  Intake/Output Summary (Last 24 hours) at 04/28/2019 0743 Last data filed at 04/28/2019 0600 Gross per 24 hour  Intake 4233.53 ml  Output 1790 ml  Net 2443.53 ml     Intake/Output this shift: No intake/output data recorded.  Labs: Recent Labs    04/28/19 0302  HGB 8.9*   Recent Labs    04/28/19 0302  WBC 7.6  RBC 2.92*  HCT 27.4*  PLT 171   Recent Labs    04/27/19 1240 04/28/19 0302  NA 137 134*  K 3.1* 3.4*  CL 101 101  CO2 25 26  BUN 21 16  CREATININE 1.10* 1.06*  GLUCOSE 94 205*  CALCIUM 8.4* 7.5*   No results for input(s): LABPT, INR in the last 72 hours.  Exam: General - Patient is Alert and Oriented Extremity - Neurologically intact Sensation intact distally Intact pulses distally Dorsiflexion/Plantar flexion intact Dressing - dressing C/D/I Motor Function - intact, moving foot and toes well on exam.   Past Medical History:  Diagnosis Date  . Arthritis    "shoulders; wrist; probably spine" (06/24/2013)  . Chronic low back pain    followed by Dr Hardin Negus pain mgt  . Colon polyp   . Constipation   . Esophageal stricture   . Finger pain, left    2 fingers on left hand since wrist surgery  . GERD (gastroesophageal reflux  disease)   . Heart murmur    "slight; not on RX" (06/24/2013)  . History of cardiac arrhythmia    cardiologist- Traci Turner  . Hx of colonic polyps 08/28/2004  . Hyperlipemia   . Hypertension   . Hypothyroidism   . Osteoarthritis of left knee    advanced  . PONV (postoperative nausea and vomiting)    Pt reports symptoms are the result of gallbladder and cholecystectomy, not anesthesia  . PVC's (premature ventricular contractions)   . Sleep apnea 1990's   "tested; tried mask; couldn't stand it; told me as long as I slept on my side I'd be ok" (06/24/2013)  . Thyroid nodule   . Tobacco abuse     Assessment/Plan: 1 Day Post-Op Procedure(s) (LRB): TOTAL HIP ARTHROPLASTY ANTERIOR APPROACH (Left) Principal Problem:   OA (osteoarthritis) of hip  Estimated body mass index is 33.02 kg/m as calculated from the following:   Height as of this encounter: 5' (1.524 m).   Weight as of this encounter: 76.7 kg. Advance diet Up with therapy  DVT Prophylaxis - Aspirin Weight bearing as tolerated. D/C O2 and pulse ox and try on room air. Hemovac pulled without difficulty, will begin therapy.  Plan is to go Home after hospital stay. Plan to work with therapy today in order to progress towards safe discharge home tomorrow. Patient was not able to  work with PT yesterday due to late afternoon surgery timing. History of hypokalemia, on regular potassium supplements at home.   Griffith Citron, PA-C Orthopedic Surgery [phone] 04/28/2019, 7:43 AM

## 2019-04-29 LAB — CBC
HCT: 24.9 % — ABNORMAL LOW (ref 36.0–46.0)
Hemoglobin: 8.1 g/dL — ABNORMAL LOW (ref 12.0–15.0)
MCH: 31.2 pg (ref 26.0–34.0)
MCHC: 32.5 g/dL (ref 30.0–36.0)
MCV: 95.8 fL (ref 80.0–100.0)
Platelets: 138 10*3/uL — ABNORMAL LOW (ref 150–400)
RBC: 2.6 MIL/uL — ABNORMAL LOW (ref 3.87–5.11)
RDW: 13 % (ref 11.5–15.5)
WBC: 7.4 10*3/uL (ref 4.0–10.5)
nRBC: 0 % (ref 0.0–0.2)

## 2019-04-29 LAB — BASIC METABOLIC PANEL
Anion gap: 6 (ref 5–15)
BUN: 13 mg/dL (ref 8–23)
CO2: 23 mmol/L (ref 22–32)
Calcium: 7.6 mg/dL — ABNORMAL LOW (ref 8.9–10.3)
Chloride: 108 mmol/L (ref 98–111)
Creatinine, Ser: 0.78 mg/dL (ref 0.44–1.00)
GFR calc Af Amer: >60 mL/min (ref >60)
GFR calc non Af Amer: >60 mL/min (ref >60)
Glucose, Bld: 148 mg/dL — ABNORMAL HIGH (ref 70–99)
Potassium: 3.8 mmol/L (ref 3.5–5.1)
Sodium: 137 mmol/L (ref 135–145)

## 2019-04-29 MED ORDER — PREDNISONE 5 MG (21) PO TBPK: 5.0000 mg | ORAL_TABLET | Freq: Four times a day (QID) | ORAL | Status: DC

## 2019-04-29 MED ORDER — FERROUS SULFATE 325 (65 FE) MG PO TABS: 325.0000 mg | ORAL_TABLET | Freq: Two times a day (BID) | ORAL | 0 refills | Status: DC

## 2019-04-29 MED ORDER — ASPIRIN 325 MG PO TBEC: 325.0000 mg | DELAYED_RELEASE_TABLET | Freq: Two times a day (BID) | ORAL | 0 refills | Status: DC

## 2019-04-29 MED ORDER — FERROUS SULFATE 325 (65 FE) MG PO TABS
325.0000 mg | ORAL_TABLET | Freq: Two times a day (BID) | ORAL | Status: DC
  Administered 2019-04-29 – 2019-04-30 (×3): 325 mg via ORAL
  Filled 2019-04-29 (×3): qty 1

## 2019-04-29 MED ORDER — PREDNISONE 5 MG (21) PO TBPK
10.0000 mg | ORAL_TABLET | Freq: Every morning | ORAL | Status: AC
  Administered 2019-04-29: 10 mg via ORAL
  Filled 2019-04-29: qty 21

## 2019-04-29 MED ORDER — OXYCODONE HCL 5 MG PO TABS: 5.0000 mg | ORAL_TABLET | Freq: Four times a day (QID) | ORAL | 0 refills | Status: DC | PRN

## 2019-04-29 MED ORDER — PREDNISONE 5 MG (21) PO TBPK
5.0000 mg | ORAL_TABLET | ORAL | Status: AC
  Administered 2019-04-29: 5 mg via ORAL

## 2019-04-29 MED ORDER — PREDNISONE 5 MG (21) PO TBPK: 10.0000 mg | ORAL_TABLET | Freq: Every evening | ORAL | Status: DC

## 2019-04-29 MED ORDER — METHOCARBAMOL 500 MG PO TABS: 500.0000 mg | ORAL_TABLET | Freq: Four times a day (QID) | ORAL | 1 refills | Status: DC | PRN

## 2019-04-29 MED ORDER — PREDNISONE 5 MG (21) PO TBPK
5.0000 mg | ORAL_TABLET | Freq: Three times a day (TID) | ORAL | Status: DC
  Administered 2019-04-30 (×2): 5 mg via ORAL

## 2019-04-29 MED ORDER — PREDNISONE 5 MG (21) PO TBPK
10.0000 mg | ORAL_TABLET | Freq: Every evening | ORAL | Status: AC
  Administered 2019-04-29: 10 mg via ORAL

## 2019-04-29 NOTE — Plan of Care (Signed)
Continue with current POC

## 2019-04-29 NOTE — Plan of Care (Signed)
Care plan reviewed and discussed with the patient. 

## 2019-04-29 NOTE — Progress Notes (Signed)
Physical Therapy Treatment Patient Details Name: Joan [name] MRN: [id number] DOB: 1945/10/08 Today's Date: 04/29/2019    History of Present Illness 73 yo female s/p L THA-direct anterior 10/19.    PT Comments    Mobility remains limited by pain, fatigue, general weakness. Pt nearly collapsed after walking ~35 feet this afternoon. Pt has not been able to meet PT goals. She is unable to d/c home on today. Will continue to follow and progress activity as tolerated.     Follow Up Recommendations  Follow surgeon's recommendation for DC plan and follow-up therapies;Supervision/Assistance - 24 hour (may need HHPT)     Equipment Recommendations  3in1 (PT)    Recommendations for Other Services       Precautions / Restrictions Precautions Precautions: Fall Restrictions Weight Bearing Restrictions: No Other Position/Activity Restrictions: WBAT    Mobility  Bed Mobility Overal bed mobility: Needs Assistance Bed Mobility: Supine to Sit;Sit to Supine     Supine to sit: Min assist;HOB elevated Sit to supine: Min assist;HOB elevated   General bed mobility comments: Assist for L LE. Increased time. Pt relied heavily on bedrail. HOB highly elevated (pt states she sleeps on multiple pillows that raise head at home)  Transfers Overall transfer level: Needs assistance Equipment used: Rolling walker (2 wheeled) Transfers: Sit to/from Stand Sit to Stand: Min assist         General transfer comment: Assist to rise, stabilize control descent. VCs safety, technique, hand placement. Increased time.  Ambulation/Gait Ambulation/Gait assistance: Min assist; +2 safety/equipment Gait Distance (Feet): 35 Feet Assistive device: Rolling walker (2 wheeled) Gait Pattern/deviations: Step-to pattern;Antalgic;Trunk flexed     General Gait Details: VCs safety, technique, sequence, distance from RW, posture. Assist to stabilize pt throughout distance. Several standing rest breaks due to  fatigue, pain. Pt nearly collapsed just outside of room door-unsure of reason (dizzy?, fatigue?). Chair emergently brought up for pt to sit in.   Stairs             Wheelchair Mobility    Modified Rankin (Stroke Patients Only)       Balance Overall balance assessment: Needs assistance         Standing balance support: Bilateral upper extremity supported Standing balance-Leahy Scale: Poor                              Cognition Arousal/Alertness: Awake/alert Behavior During Therapy: WFL for tasks assessed/performed Overall Cognitive Status: Within Functional Limits for tasks assessed                                        Exercises Total Joint Exercises Ankle Circles/Pumps: AROM;Both;10 reps;Supine Quad Sets: AROM;Left;Supine;10 reps Heel Slides: AAROM;Left;Supine;10 reps Hip ABduction/ADduction: AAROM;Left;10 reps;Supine    General Comments        Pertinent Vitals/Pain Pain Assessment: Faces Pain Score: 8  Faces Pain Scale: Hurts whole lot Pain Location: L hip/thigh, knee Pain Descriptors / Indicators: Aching;Sore;Discomfort Pain Intervention(s): Monitored during session;Limited activity within patient's tolerance;Repositioned    Home Living                      Prior Function            PT Goals (current goals can now be found in the care plan section) Progress towards PT goals: Progressing toward goals  Frequency    7X/week      PT Plan Current plan remains appropriate    Co-evaluation              AM-PAC PT "6 Clicks" Mobility   Outcome Measure  Help needed turning from your back to your side while in a flat bed without using bedrails?: A Little Help needed moving from lying on your back to sitting on the side of a flat bed without using bedrails?: A Little Help needed moving to and from a bed to a chair (including a wheelchair)?: A Little Help needed standing up from a chair using your  arms (e.g., wheelchair or bedside chair)?: A Little Help needed to walk in hospital room?: A Little Help needed climbing 3-5 steps with a railing? : A Lot 6 Click Score: 17    End of Session Equipment Utilized During Treatment: Gait belt Activity Tolerance: Patient limited by fatigue;Patient limited by pain Patient left: in bed;with call bell/phone within reach   PT Visit Diagnosis: Pain;Other abnormalities of gait and mobility (R26.89) Pain - Right/Left: Left Pain - part of body: Hip     Time: [id number] PT Time Calculation (min) (ACUTE ONLY): 23 min  Charges:  $Gait Training: 23-37 mins $Therapeutic Exercise: 8-22 mins                       Weston Anna, PT Acute Rehabilitation Services Pager: [phone] Office: [phone]

## 2019-04-29 NOTE — Progress Notes (Signed)
Physical Therapy Treatment Patient Details Name: Joan [name] MRN: [id number] DOB: 24-Feb-1946 Today's Date: 04/29/2019    History of Present Illness 73 yo female s/p L THA-direct anterior 10/19.    PT Comments    Progressing slowly with mobility. Mobility remains limited by pain and fatigue. Will continue to progress activity as tolerated. Do not anticipate pt will be ready to d/c home on today-made RN aware.    Follow Up Recommendations  Follow surgeon's recommendation for DC plan and follow-up therapies;Supervision/Assistance - 24 hour(may need HHPT)     Equipment Recommendations  3in1 (PT)    Recommendations for Other Services       Precautions / Restrictions Precautions Precautions: Fall Restrictions Weight Bearing Restrictions: No Other Position/Activity Restrictions: WBAT    Mobility  Bed Mobility Overal bed mobility: Needs Assistance Bed Mobility: Supine to Sit;Sit to Supine     Supine to sit: Min assist;HOB elevated Sit to supine: Min assist;HOB elevated   General bed mobility comments: Assist for L LE. Increased time. Pt relied heavily on bedrail  Transfers Overall transfer level: Needs assistance Equipment used: Rolling walker (2 wheeled) Transfers: Sit to/from Stand Sit to Stand: Min assist         General transfer comment: Assist to rise, stabilize control descent. VCs safety, technique, hand placement. Increased time.  Ambulation/Gait Ambulation/Gait assistance: Min assist Gait Distance (Feet): 50 Feet Assistive device: Rolling walker (2 wheeled) Gait Pattern/deviations: Step-to pattern;Antalgic;Trunk flexed     General Gait Details: VCs safety, technique, sequence, distance from RW, posture. Assist to stabilize pt throughout distance. Several standing rest breaks due to fatigue, pain.   Stairs             Wheelchair Mobility    Modified Rankin (Stroke Patients Only)       Balance Overall balance assessment: Needs  assistance         Standing balance support: Bilateral upper extremity supported Standing balance-Leahy Scale: Poor                              Cognition Arousal/Alertness: Awake/alert Behavior During Therapy: WFL for tasks assessed/performed Overall Cognitive Status: Within Functional Limits for tasks assessed                                        Exercises Total Joint Exercises Ankle Circles/Pumps: AROM;Both;10 reps;Supine Quad Sets: AROM;Left;Supine;10 reps Heel Slides: AAROM;Left;Supine;10 reps Hip ABduction/ADduction: AAROM;Left;10 reps;Supine    General Comments        Pertinent Vitals/Pain Pain Assessment: 0-10 Pain Score: 8  Pain Location: L hip/thigh Pain Descriptors / Indicators: Burning;Sharp;Radiating Pain Intervention(s): Monitored during session;Ice applied;Repositioned    Home Living                      Prior Function            PT Goals (current goals can now be found in the care plan section) Progress towards PT goals: Progressing toward goals    Frequency    7X/week      PT Plan Current plan remains appropriate    Co-evaluation              AM-PAC PT "6 Clicks" Mobility   Outcome Measure  Help needed turning from your back to your side while in a flat bed without using bedrails?:  A Little Help needed moving from lying on your back to sitting on the side of a flat bed without using bedrails?: A Little Help needed moving to and from a bed to a chair (including a wheelchair)?: A Little Help needed standing up from a chair using your arms (e.g., wheelchair or bedside chair)?: A Little Help needed to walk in hospital room?: A Little Help needed climbing 3-5 steps with a railing? : A Lot 6 Click Score: 17    End of Session Equipment Utilized During Treatment: Gait belt Activity Tolerance: Patient limited by fatigue;Patient limited by pain Patient left: in bed;with call bell/phone within  reach   PT Visit Diagnosis: Pain;Other abnormalities of gait and mobility (R26.89) Pain - Right/Left: Left Pain - part of body: Hip     Time: [id number] PT Time Calculation (min) (ACUTE ONLY): 37 min  Charges:  $Gait Training: 8-22 mins $Therapeutic Exercise: 8-22 mins                        Weston Anna, PT Acute Rehabilitation Services Pager: [phone] Office: [phone]

## 2019-04-29 NOTE — Progress Notes (Signed)
Subjective: 2 Days Post-Op Procedure(s) (LRB): TOTAL HIP ARTHROPLASTY ANTERIOR APPROACH (Left) Patient reports pain as moderate.   Patient seen in rounds for Dr. Wynelle Link. Patient is having problems with burning discomfort over the left hip and nerve pain into her left knee. She reports having a sharp pain radiate to her knee during the spinal attempt and says that the pain as remained. She denies SOB and chest pain. Denies dizziness at this time. Voiding well. No BM but positive flatus. Reports that "all nerve pills make" her itch and get a rash.  We will resume therapy today.  Plan is to go Home after hospital stay.  Objective: Vital signs in last 24 hours: Temp:  [97.7 F (36.5 C)-98.8 F (37.1 C)] 98.3 F (36.8 C) (10/21 0607) Pulse Rate:  [66-78] 78 (10/21 0607) Resp:  [16] 16 (10/21 0607) BP: (94-113)/(47-62) 107/53 (10/21 0607) SpO2:  [96 %-100 %] 96 % (10/21 0607)  Intake/Output from previous day:  Intake/Output Summary (Last 24 hours) at 04/29/2019 0729 Last data filed at 04/29/2019 0607 Gross per 24 hour  Intake 2717.42 ml  Output 600 ml  Net 2117.42 ml     Labs: Recent Labs    04/28/19 0302 04/29/19 0244  HGB 8.9* 8.1*   Recent Labs    04/28/19 0302 04/29/19 0244  WBC 7.6 7.4  RBC 2.92* 2.60*  HCT 27.4* 24.9*  PLT 171 138*   Recent Labs    04/28/19 0302 04/29/19 0244  NA 134* 137  K 3.4* 3.8  CL 101 108  CO2 26 23  BUN 16 13  CREATININE 1.06* 0.78  GLUCOSE 205* 148*  CALCIUM 7.5* 7.6*    EXAM General - Patient is Alert and Oriented Extremity - Neurologically intact Intact pulses distally Dorsiflexion/Plantar flexion intact No cellulitis present Compartment soft Dressing - no drainage Motor Function - intact, moving foot and toes well on exam.  No effusion or specific tenderness about the left knee.   Past Medical History:  Diagnosis Date  . Arthritis    "shoulders; wrist; probably spine" (06/24/2013)  . Chronic low back pain    followed by Dr Hardin Negus pain mgt  . Colon polyp   . Constipation   . Esophageal stricture   . Finger pain, left    2 fingers on left hand since wrist surgery  . GERD (gastroesophageal reflux disease)   . Heart murmur    "slight; not on RX" (06/24/2013)  . History of cardiac arrhythmia    cardiologist- Traci Turner  . Hx of colonic polyps 08/28/2004  . Hyperlipemia   . Hypertension   . Hypothyroidism   . Osteoarthritis of left knee    advanced  . PONV (postoperative nausea and vomiting)    Pt reports symptoms are the result of gallbladder and cholecystectomy, not anesthesia  . PVC's (premature ventricular contractions)   . Sleep apnea 1990's   "tested; tried mask; couldn't stand it; told me as long as I slept on my side I'd be ok" (06/24/2013)  . Thyroid nodule   . Tobacco abuse     Assessment/Plan: 2 Days Post-Op Procedure(s) (LRB): TOTAL HIP ARTHROPLASTY ANTERIOR APPROACH (Left) Principal Problem:   OA (osteoarthritis) of hip  Estimated body mass index is 33.02 kg/m as calculated from the following:   Height as of this encounter: 5' (1.524 m).   Weight as of this encounter: 76.7 kg. Advance diet Up with therapy D/C IV fluids  DVT Prophylaxis - Aspirin Weight Bearing As Tolerated  We will continue with therapy today for two sessions. Left knee pain appears to be secondary to traction and/or nerve related. Unable to place no gabapentin for nerve pain due to itching/rash allergy. Encouraged fluids. Will hold lasix and HCTZ today to avoid pushing hypotension down farther. Will discuss nerve pain issue with Dr. Wynelle Link. Hopeful for DC home this afternoon if she is progressing with therapy. Follow up in 2 weeks. Discharge instructions given.   Ardeen Jourdain, PA-C Orthopaedic Surgery 04/29/2019, 7:29 AM

## 2019-04-30 LAB — CBC
HCT: 26.9 % — ABNORMAL LOW (ref 36.0–46.0)
Hemoglobin: 8.6 g/dL — ABNORMAL LOW (ref 12.0–15.0)
MCH: 30.7 pg (ref 26.0–34.0)
MCHC: 32 g/dL (ref 30.0–36.0)
MCV: 96.1 fL (ref 80.0–100.0)
Platelets: 144 10*3/uL — ABNORMAL LOW (ref 150–400)
RBC: 2.8 MIL/uL — ABNORMAL LOW (ref 3.87–5.11)
RDW: 13.2 % (ref 11.5–15.5)
WBC: 7.2 10*3/uL (ref 4.0–10.5)
nRBC: 0 % (ref 0.0–0.2)

## 2019-04-30 NOTE — Plan of Care (Signed)
  Problem: Clinical Measurements: Goal: Will remain free from infection 04/30/2019 2040 by Hubert Azure, RN Outcome: Adequate for Discharge 04/30/2019 1217 by Hubert Azure, RN Outcome: Adequate for Discharge Goal: Respiratory complications will improve 04/30/2019 2040 by Hubert Azure, RN Outcome: Adequate for Discharge 04/30/2019 1217 by Hubert Azure, RN Outcome: Adequate for Discharge   Problem: Activity: Goal: Risk for activity intolerance will decrease 04/30/2019 2040 by Hubert Azure, RN Outcome: Adequate for Discharge 04/30/2019 1217 by Hubert Azure, RN Outcome: Adequate for Discharge   Problem: Coping: Goal: Level of anxiety will decrease 04/30/2019 2040 by Hubert Azure, RN Outcome: Adequate for Discharge 04/30/2019 1217 by Hubert Azure, RN Outcome: Adequate for Discharge   Problem: Elimination: Goal: Will not experience complications related to bowel motility 04/30/2019 2040 by Hubert Azure, RN Outcome: Adequate for Discharge 04/30/2019 1217 by Hubert Azure, RN Outcome: Adequate for Discharge   Problem: Pain Managment: Goal: General experience of comfort will improve 04/30/2019 2040 by Hubert Azure, RN Outcome: Adequate for Discharge 04/30/2019 1217 by Hubert Azure, RN Outcome: Adequate for Discharge   Problem: Education: Goal: Understanding of discharge needs will improve 04/30/2019 2040 by Hubert Azure, RN Outcome: Adequate for Discharge 04/30/2019 1217 by Hubert Azure, RN Outcome: Adequate for Discharge   Problem: Activity: Goal: Ability to avoid complications of mobility impairment will improve 04/30/2019 2040 by Hubert Azure, RN Outcome: Adequate for Discharge 04/30/2019 1217 by Hubert Azure, RN Outcome: Adequate for Discharge Goal: Ability to tolerate increased activity will improve 04/30/2019 2040 by Hubert Azure, RN Outcome: Adequate for Discharge 04/30/2019 1217 by Hubert Azure, RN Outcome:  Adequate for Discharge   Problem: Clinical Measurements: Goal: Postoperative complications will be avoided or minimized 04/30/2019 2040 by Hubert Azure, RN Outcome: Adequate for Discharge 04/30/2019 1217 by Hubert Azure, RN Outcome: Adequate for Discharge   Problem: Pain Management: Goal: Pain level will decrease with appropriate interventions 04/30/2019 2040 by Hubert Azure, RN Outcome: Adequate for Discharge 04/30/2019 1217 by Hubert Azure, RN Outcome: Adequate for Discharge

## 2019-04-30 NOTE — Progress Notes (Signed)
   Subjective: 3 Days Post-Op Procedure(s) (LRB): TOTAL HIP ARTHROPLASTY ANTERIOR APPROACH (Left) Patient reports pain as mild.   Patient seen in rounds with Dr. Wynelle Link. Patient is well, and has had no acute complaints or problems other than discomfort in the left hip and low back. She is having trouble getting around with therapy due to some underlying radicular pain. No SOB or chest pain.  We will resume therapy today.  Plan is to go Home after hospital stay.  Objective: Vital signs in last 24 hours: Temp:  [97.9 F (36.6 C)-98.2 F (36.8 C)] 97.9 F (36.6 C) (10/22 0621) Pulse Rate:  [63-84] 63 (10/22 0621) Resp:  [15-18] 16 (10/22 0621) BP: (98-116)/(42-63) 98/54 (10/22 0621) SpO2:  [95 %-97 %] 95 % (10/22 0621)  Intake/Output from previous day:  Intake/Output Summary (Last 24 hours) at 04/30/2019 0728 Last data filed at 04/30/2019 0630 Gross per 24 hour  Intake 720 ml  Output 1200 ml  Net -480 ml     Labs: Recent Labs    04/28/19 0302 04/29/19 0244 04/30/19 0242  HGB 8.9* 8.1* 8.6*   Recent Labs    04/29/19 0244 04/30/19 0242  WBC 7.4 7.2  RBC 2.60* 2.80*  HCT 24.9* 26.9*  PLT 138* 144*   Recent Labs    04/28/19 0302 04/29/19 0244  NA 134* 137  K 3.4* 3.8  CL 101 108  CO2 26 23  BUN 16 13  CREATININE 1.06* 0.78  GLUCOSE 205* 148*  CALCIUM 7.5* 7.6*    EXAM General - Patient is Alert and Oriented Extremity - Neurologically intact Intact pulses distally Dorsiflexion/Plantar flexion intact No cellulitis present Compartment soft Dressing - dressing C/D/I Motor Function - intact, moving foot and toes well on exam.   Past Medical History:  Diagnosis Date  . Arthritis    "shoulders; wrist; probably spine" (06/24/2013)  . Chronic low back pain    followed by Dr Hardin Negus pain mgt  . Colon polyp   . Constipation   . Esophageal stricture   . Finger pain, left    2 fingers on left hand since wrist surgery  . GERD (gastroesophageal reflux  disease)   . Heart murmur    "slight; not on RX" (06/24/2013)  . History of cardiac arrhythmia    cardiologist- Traci Turner  . Hx of colonic polyps 08/28/2004  . Hyperlipemia   . Hypertension   . Hypothyroidism   . Osteoarthritis of left knee    advanced  . PONV (postoperative nausea and vomiting)    Pt reports symptoms are the result of gallbladder and cholecystectomy, not anesthesia  . PVC's (premature ventricular contractions)   . Sleep apnea 1990's   "tested; tried mask; couldn't stand it; told me as long as I slept on my side I'd be ok" (06/24/2013)  . Thyroid nodule   . Tobacco abuse     Assessment/Plan: 3 Days Post-Op Procedure(s) (LRB): TOTAL HIP ARTHROPLASTY ANTERIOR APPROACH (Left) Principal Problem:   OA (osteoarthritis) of hip  Estimated body mass index is 33.02 kg/m as calculated from the following:   Height as of this encounter: 5' (1.524 m).   Weight as of this encounter: 76.7 kg. Advance diet Up with therapy Discharge home with home health  DVT Prophylaxis - Aspirin Weight Bearing As Tolerated   Continue therapy today. DC home with HHPT. Follow up in office in 2 weeks. Discharge instructions given.    Joan Jourdain, PA-C Orthopaedic Surgery 04/30/2019, 7:28 AM

## 2019-04-30 NOTE — Care Plan (Signed)
HHC orders given to Fritzi Mandes with [facility]

## 2019-04-30 NOTE — Progress Notes (Signed)
Physical Therapy Treatment Patient Details Name: Joan [name] MRN: [id number] DOB: 1946/04/14 Today's Date: 04/30/2019    History of Present Illness 73 yo female s/p L THA-direct anterior 10/19.    PT Comments    Pt assisted with ambulating in hallway and able to progress distance.  Pt denies any dizziness today.  Pt also assisted to bathroom and then to recliner.  Pt anticipates d/c home later today after second PT session.    Follow Up Recommendations  Follow surgeon's recommendation for DC plan and follow-up therapies;Supervision/Assistance - 24 hour(HHPT)     Equipment Recommendations  3in1 (PT)    Recommendations for Other Services       Precautions / Restrictions Precautions Precautions: Fall Restrictions Weight Bearing Restrictions: No Other Position/Activity Restrictions: WBAT    Mobility  Bed Mobility Overal bed mobility: Needs Assistance Bed Mobility: Supine to Sit     Supine to sit: HOB elevated;Min guard     General bed mobility comments: increased time and effort, pt self assisted L LE over EOB  Transfers Overall transfer level: Needs assistance Equipment used: Rolling walker (2 wheeled) Transfers: Sit to/from Stand Sit to Stand: Min guard         General transfer comment: verbal cues for safety, technique, hand placement  Ambulation/Gait Ambulation/Gait assistance: Min guard Gait Distance (Feet): 80 Feet(total) Assistive device: Rolling walker (2 wheeled) Gait Pattern/deviations: Step-to pattern;Antalgic;Trunk flexed     General Gait Details: verbal cues for posture, step length, RW positioning, pt required 3 standing rest breaks and one seated rest break   Stairs             Wheelchair Mobility    Modified Rankin (Stroke Patients Only)       Balance                                            Cognition Arousal/Alertness: Awake/alert Behavior During Therapy: WFL for tasks assessed/performed Overall  Cognitive Status: Within Functional Limits for tasks assessed                                        Exercises      General Comments        Pertinent Vitals/Pain Pain Assessment: 0-10 Pain Score: 7  Pain Location: L anterior hip "burning" Pain Descriptors / Indicators: Aching;Sore;Discomfort Pain Intervention(s): Monitored during session;Repositioned    Home Living                      Prior Function            PT Goals (current goals can now be found in the care plan section) Progress towards PT goals: Progressing toward goals    Frequency    7X/week      PT Plan Current plan remains appropriate    Co-evaluation              AM-PAC PT "6 Clicks" Mobility   Outcome Measure  Help needed turning from your back to your side while in a flat bed without using bedrails?: A Little Help needed moving from lying on your back to sitting on the side of a flat bed without using bedrails?: A Little Help needed moving to and from a bed to a chair (including a  wheelchair)?: A Little Help needed standing up from a chair using your arms (e.g., wheelchair or bedside chair)?: A Little Help needed to walk in hospital room?: A Little Help needed climbing 3-5 steps with a railing? : A Lot 6 Click Score: 17    End of Session Equipment Utilized During Treatment: Gait belt Activity Tolerance: Patient tolerated treatment well Patient left: in chair;with call bell/phone within reach;with nursing/sitter in room   PT Visit Diagnosis: Other abnormalities of gait and mobility (R26.89)     Time: [id number] PT Time Calculation (min) (ACUTE ONLY): 24 min  Charges:  $Gait Training: 23-37 mins                    Carmelia Bake, PT, DPT Acute Rehabilitation Services Office: [phone] Pager: [phone]   Trena Platt 04/30/2019, 12:46 PM

## 2019-04-30 NOTE — Plan of Care (Signed)
  Problem: Clinical Measurements: Goal: Will remain free from infection Outcome: Adequate for Discharge Goal: Respiratory complications will improve Outcome: Adequate for Discharge   Problem: Activity: Goal: Risk for activity intolerance will decrease Outcome: Adequate for Discharge   Problem: Coping: Goal: Level of anxiety will decrease Outcome: Adequate for Discharge   Problem: Elimination: Goal: Will not experience complications related to bowel motility Outcome: Adequate for Discharge   Problem: Pain Managment: Goal: General experience of comfort will improve Outcome: Adequate for Discharge   Problem: Education: Goal: Understanding of discharge needs will improve Outcome: Adequate for Discharge   Problem: Activity: Goal: Ability to avoid complications of mobility impairment will improve Outcome: Adequate for Discharge Goal: Ability to tolerate increased activity will improve Outcome: Adequate for Discharge   Problem: Clinical Measurements: Goal: Postoperative complications will be avoided or minimized Outcome: Adequate for Discharge   Problem: Pain Management: Goal: Pain level will decrease with appropriate interventions Outcome: Adequate for Discharge

## 2019-04-30 NOTE — Care Management Important Message (Signed)
Important Message  Patient Details IM Letter given to Velva Harman RN to present to the Patient Name: Joan [name] MRN: [id number] Date of Birth: 1946-05-12   Medicare Important Message Given:  Yes     Kerin Salen 04/30/2019, 3:31 PM

## 2019-04-30 NOTE — Progress Notes (Signed)
Physical Therapy Treatment Patient Details Name: Joan [name] MRN: [id number] DOB: Dec 17, 1945 Today's Date: 04/30/2019    History of Present Illness 73 yo female s/p L THA-direct anterior 10/19.    PT Comments    Pt assisted to bathroom and then ambulated in hallway again.  Pt agreeable to have son assist with mobility for safety upon d/c.  Pt also performed a few exercises upon return to bed.  Pt has HEP handout and aware not to perform standing exercises until with next therapist for safety.  Pt plans to d/c home with assist from son later today and had no further questions.    Follow Up Recommendations  Follow surgeon's recommendation for DC plan and follow-up therapies;Supervision/Assistance - 24 hour     Equipment Recommendations  3in1 (PT)    Recommendations for Other Services       Precautions / Restrictions Precautions Precautions: Fall Restrictions Other Position/Activity Restrictions: WBAT    Mobility  Bed Mobility Overal bed mobility: Needs Assistance Bed Mobility: Sit to Supine     Supine to sit: HOB elevated;Min guard Sit to supine: Min assist   General bed mobility comments: increased time and effort,required assist for L LE  Transfers Overall transfer level: Needs assistance Equipment used: Rolling walker (2 wheeled) Transfers: Sit to/from Stand Sit to Stand: Min guard         General transfer comment: verbal cues for safety, technique, hand placement  Ambulation/Gait Ambulation/Gait assistance: Min guard Gait Distance (Feet): 60 Feet Assistive device: Rolling walker (2 wheeled) Gait Pattern/deviations: Step-to pattern;Antalgic;Trunk flexed     General Gait Details: verbal cues for posture, step length, RW positioning, pt required 3 standing rest breaks   Stairs             Wheelchair Mobility    Modified Rankin (Stroke Patients Only)       Balance                                             Cognition Arousal/Alertness: Awake/alert Behavior During Therapy: WFL for tasks assessed/performed Overall Cognitive Status: Within Functional Limits for tasks assessed                                        Exercises Total Joint Exercises Ankle Circles/Pumps: AROM;Both;10 reps;Supine Quad Sets: AROM;Left;Supine;10 reps Heel Slides: AAROM;Left;Supine;10 reps Hip ABduction/ADduction: AAROM;Left;10 reps;Supine    General Comments        Pertinent Vitals/Pain Pain Assessment: 0-10 Pain Score: 7  Pain Location: L anterior hip "burning" Pain Descriptors / Indicators: Aching;Sore;Discomfort Pain Intervention(s): Monitored during session;Repositioned;Ice applied    Home Living                      Prior Function            PT Goals (current goals can now be found in the care plan section) Progress towards PT goals: Progressing toward goals    Frequency    7X/week      PT Plan Current plan remains appropriate    Co-evaluation              AM-PAC PT "6 Clicks" Mobility   Outcome Measure  Help needed turning from your back to your side while in a flat bed  without using bedrails?: A Little Help needed moving from lying on your back to sitting on the side of a flat bed without using bedrails?: A Little Help needed moving to and from a bed to a chair (including a wheelchair)?: A Little Help needed standing up from a chair using your arms (e.g., wheelchair or bedside chair)?: A Little Help needed to walk in hospital room?: A Little Help needed climbing 3-5 steps with a railing? : A Little 6 Click Score: 18    End of Session Equipment Utilized During Treatment: Gait belt Activity Tolerance: Patient tolerated treatment well Patient left: in bed;with call bell/phone within reach   PT Visit Diagnosis: Other abnormalities of gait and mobility (R26.89)     Time: [id number] PT Time Calculation (min) (ACUTE ONLY): 23 min  Charges:  $Gait  Training: 8-22 mins $Therapeutic Exercise: 8-22 mins                     Carmelia Bake, PT, DPT Acute Rehabilitation Services Office: [phone] Pager: [phone]  Trena Platt 04/30/2019, 3:20 PM

## 2019-05-03 DIAGNOSIS — M48061 Spinal stenosis, lumbar region without neurogenic claudication: Secondary | ICD-10-CM | POA: Diagnosis not present

## 2019-05-03 DIAGNOSIS — E041 Nontoxic single thyroid nodule: Secondary | ICD-10-CM | POA: Diagnosis not present

## 2019-05-03 DIAGNOSIS — G4700 Insomnia, unspecified: Secondary | ICD-10-CM | POA: Diagnosis not present

## 2019-05-03 DIAGNOSIS — Z471 Aftercare following joint replacement surgery: Secondary | ICD-10-CM | POA: Diagnosis not present

## 2019-05-03 DIAGNOSIS — Z86010 Personal history of colonic polyps: Secondary | ICD-10-CM | POA: Diagnosis not present

## 2019-05-03 DIAGNOSIS — E039 Hypothyroidism, unspecified: Secondary | ICD-10-CM | POA: Diagnosis not present

## 2019-05-03 DIAGNOSIS — K5903 Drug induced constipation: Secondary | ICD-10-CM | POA: Diagnosis not present

## 2019-05-03 DIAGNOSIS — R911 Solitary pulmonary nodule: Secondary | ICD-10-CM | POA: Diagnosis not present

## 2019-05-03 DIAGNOSIS — I10 Essential (primary) hypertension: Secondary | ICD-10-CM | POA: Diagnosis not present

## 2019-05-03 DIAGNOSIS — K219 Gastro-esophageal reflux disease without esophagitis: Secondary | ICD-10-CM | POA: Diagnosis not present

## 2019-05-03 DIAGNOSIS — M19012 Primary osteoarthritis, left shoulder: Secondary | ICD-10-CM | POA: Diagnosis not present

## 2019-05-03 DIAGNOSIS — D649 Anemia, unspecified: Secondary | ICD-10-CM | POA: Diagnosis not present

## 2019-05-03 DIAGNOSIS — Z96642 Presence of left artificial hip joint: Secondary | ICD-10-CM | POA: Diagnosis not present

## 2019-05-03 DIAGNOSIS — M19011 Primary osteoarthritis, right shoulder: Secondary | ICD-10-CM | POA: Diagnosis not present

## 2019-05-03 DIAGNOSIS — M1711 Unilateral primary osteoarthritis, right knee: Secondary | ICD-10-CM | POA: Diagnosis not present

## 2019-05-03 DIAGNOSIS — E785 Hyperlipidemia, unspecified: Secondary | ICD-10-CM | POA: Diagnosis not present

## 2019-05-03 DIAGNOSIS — I700 Atherosclerosis of aorta: Secondary | ICD-10-CM | POA: Diagnosis not present

## 2019-05-03 DIAGNOSIS — G4730 Sleep apnea, unspecified: Secondary | ICD-10-CM | POA: Diagnosis not present

## 2019-05-04 NOTE — Discharge Summary (Signed)
Physician Discharge Summary   Patient ID: Joan [name] MRN: [id number] DOB/AGE: 1946-04-04 73 y.o.  Admit date: 04/27/2019 Discharge date: 04/30/2019  Primary Diagnosis: Primary osteoarthritis left hip  Admission Diagnoses:  Past Medical History:  Diagnosis Date  . Arthritis    "shoulders; wrist; probably spine" (06/24/2013)  . Chronic low back pain    followed by Dr Hardin Negus pain mgt  . Colon polyp   . Constipation   . Esophageal stricture   . Finger pain, left    2 fingers on left hand since wrist surgery  . GERD (gastroesophageal reflux disease)   . Heart murmur    "slight; not on RX" (06/24/2013)  . History of cardiac arrhythmia    cardiologist- Traci Turner  . Hx of colonic polyps 08/28/2004  . Hyperlipemia   . Hypertension   . Hypothyroidism   . Osteoarthritis of left knee    advanced  . PONV (postoperative nausea and vomiting)    Pt reports symptoms are the result of gallbladder and cholecystectomy, not anesthesia  . PVC's (premature ventricular contractions)   . Sleep apnea 1990's   "tested; tried mask; couldn't stand it; told me as long as I slept on my side I'd be ok" (06/24/2013)  . Thyroid nodule   . Tobacco abuse    Discharge Diagnoses:   Principal Problem:   OA (osteoarthritis) of hip  Estimated body mass index is 33.02 kg/m as calculated from the following:   Height as of this encounter: 5' (1.524 m).   Weight as of this encounter: 76.7 kg.  Procedure(s) (LRB): TOTAL HIP ARTHROPLASTY ANTERIOR APPROACH (Left)   Consults: None  HPI: Joan [name], 73 y.o. female, has a history of pain and functional disability in the left hip(s) due to arthritis and patient has failed non-surgical conservative treatments for greater than 12 weeks to include NSAID's and/or analgesics, corticosteriod injections, flexibility and strengthening excercises, use of assistive devices and activity modification.  Onset of symptoms was gradual starting 3 years ago with  gradually worsening course since that time.The patient noted no past surgery on the left hip(s).  Patient currently rates pain in the left hip at 7 out of 10 with activity. Patient has night pain, worsening of pain with activity and weight bearing, pain that interfers with activities of daily living and pain with passive range of motion. Patient has evidence of periarticular osteophytes and joint space narrowing by imaging studies. This condition presents safety issues increasing the risk of falls. There is no current active infection.  Laboratory Data: Admission on 04/27/2019, Discharged on 04/30/2019  Component Date Value Ref Range Status  . Sodium 04/27/2019 137  135 - 145 mmol/L Final  . Potassium 04/27/2019 3.1* 3.5 - 5.1 mmol/L Final  . Chloride 04/27/2019 101  98 - 111 mmol/L Final  . CO2 04/27/2019 25  22 - 32 mmol/L Final  . Glucose, Bld 04/27/2019 94  70 - 99 mg/dL Final  . BUN 04/27/2019 21  8 - 23 mg/dL Final  . Creatinine, Ser 04/27/2019 1.10* 0.44 - 1.00 mg/dL Final  . Calcium 04/27/2019 8.4* 8.9 - 10.3 mg/dL Final  . Total Protein 04/27/2019 6.6  6.5 - 8.1 g/dL Final  . Albumin 04/27/2019 3.9  3.5 - 5.0 g/dL Final  . AST 04/27/2019 20  15 - 41 U/L Final  . ALT 04/27/2019 15  0 - 44 U/L Final  . Alkaline Phosphatase 04/27/2019 40  38 - 126 U/L Final  . Total Bilirubin 04/27/2019 0.6  0.3 - 1.2 mg/dL Final  . GFR calc non Af Amer 04/27/2019 49* >60 mL/min Final  . GFR calc Af Amer 04/27/2019 57* >60 mL/min Final  . Anion gap 04/27/2019 11  5 - 15 Final   Performed at [facility], [place] [address], [place], [place] [address]  . WBC 04/28/2019 7.6  4.0 - 10.5 K/uL Final  . RBC 04/28/2019 2.92* 3.87 - 5.11 MIL/uL Final  . Hemoglobin 04/28/2019 8.9* 12.0 - 15.0 g/dL Final  . HCT 04/28/2019 27.4* 36.0 - 46.0 % Final  . MCV 04/28/2019 93.8  80.0 - 100.0 fL Final  . MCH 04/28/2019 30.5  26.0 - 34.0 pg Final  . MCHC 04/28/2019 32.5  30.0 - 36.0 g/dL Final  . RDW  04/28/2019 12.7  11.5 - 15.5 % Final  . Platelets 04/28/2019 171  150 - 400 K/uL Final  . nRBC 04/28/2019 0.0  0.0 - 0.2 % Final   Performed at [facility], [place] [address] [place], [place] [address]  . Sodium 04/28/2019 134* 135 - 145 mmol/L Final  . Potassium 04/28/2019 3.4* 3.5 - 5.1 mmol/L Final  . Chloride 04/28/2019 101  98 - 111 mmol/L Final  . CO2 04/28/2019 26  22 - 32 mmol/L Final  . Glucose, Bld 04/28/2019 205* 70 - 99 mg/dL Final  . BUN 04/28/2019 16  8 - 23 mg/dL Final  . Creatinine, Ser 04/28/2019 1.06* 0.44 - 1.00 mg/dL Final  . Calcium 04/28/2019 7.5* 8.9 - 10.3 mg/dL Final  . GFR calc non Af Amer 04/28/2019 52* >60 mL/min Final  . GFR calc Af Amer 04/28/2019 60* >60 mL/min Final  . Anion gap 04/28/2019 7  5 - 15 Final   Performed at [facility], [place] [address], [place], [place] [address]  . WBC 04/29/2019 7.4  4.0 - 10.5 K/uL Final  . RBC 04/29/2019 2.60* 3.87 - 5.11 MIL/uL Final  . Hemoglobin 04/29/2019 8.1* 12.0 - 15.0 g/dL Final  . HCT 04/29/2019 24.9* 36.0 - 46.0 % Final  . MCV 04/29/2019 95.8  80.0 - 100.0 fL Final  . MCH 04/29/2019 31.2  26.0 - 34.0 pg Final  . MCHC 04/29/2019 32.5  30.0 - 36.0 g/dL Final  . RDW 04/29/2019 13.0  11.5 - 15.5 % Final  . Platelets 04/29/2019 138* 150 - 400 K/uL Final  . nRBC 04/29/2019 0.0  0.0 - 0.2 % Final   Performed at [facility], [place] [address], [place], [place] [address]  . Sodium 04/29/2019 137  135 - 145 mmol/L Final  . Potassium 04/29/2019 3.8  3.5 - 5.1 mmol/L Final  . Chloride 04/29/2019 108  98 - 111 mmol/L Final  . CO2 04/29/2019 23  22 - 32 mmol/L Final  . Glucose, Bld 04/29/2019 148* 70 - 99 mg/dL Final  . BUN 04/29/2019 13  8 - 23 mg/dL Final  . Creatinine, Ser 04/29/2019 0.78  0.44 - 1.00 mg/dL Final  . Calcium 04/29/2019 7.6* 8.9 - 10.3 mg/dL Final  . GFR calc non Af Amer 04/29/2019 >60  >60 mL/min Final  . GFR calc Af Amer 04/29/2019 >60  >60 mL/min  Final  . Anion gap 04/29/2019 6  5 - 15 Final   Performed at [facility], [place] [address] [place],  [address]  . WBC 04/30/2019 7.2  4.0 - 10.5 K/uL Final  . RBC 04/30/2019 2.80* 3.87 - 5.11 MIL/uL Final  . Hemoglobin 04/30/2019 8.6* 12.0 -  15.0 g/dL Final  . HCT 04/30/2019 26.9* 36.0 - 46.0 % Final  . MCV 04/30/2019 96.1  80.0 - 100.0 fL Final  . MCH 04/30/2019 30.7  26.0 - 34.0 pg Final  . MCHC 04/30/2019 32.0  30.0 - 36.0 g/dL Final  . RDW 04/30/2019 13.2  11.5 - 15.5 % Final  . Platelets 04/30/2019 144* 150 - 400 K/uL Final  . nRBC 04/30/2019 0.0  0.0 - 0.2 % Final   Performed at [facility], [place] [address] [place], [place] [address]  Hospital Outpatient Visit on 04/23/2019  Component Date Value Ref Range Status  . SARS-CoV-2, NAA 04/23/2019 NOT DETECTED  NOT DETECTED Final   Comment: (NOTE) This nucleic acid amplification test was developed and its performance characteristics determined by [employer]. Nucleic acid amplification tests include PCR and TMA. This test has not been FDA cleared or approved. This test has been authorized by FDA under an Emergency Use Authorization (EUA). This test is only authorized for the duration of time the declaration that circumstances exist justifying the authorization of the emergency use of in vitro diagnostic tests for detection of SARS-CoV-2 virus and/or diagnosis of COVID-19 infection under section 564(b)(1) of the Act, 21 U.S.C. [id number]) (1), unless the authorization is terminated or revoked sooner. When diagnostic testing is negative, the possibility of a false negative result should be considered in the context of a patient's recent exposures and the presence of clinical signs and symptoms consistent with COVID-19. An individual without symptoms of COVID- 19 and who is not shedding SARS-CoV-2 vi                          rus would expect to have a negative (not detected)  result in this assay. Performed At: [facility] [place], [place] [id number] Rush Farmer MD [id number]   . Coronavirus Source 04/23/2019 NASOPHARYNGEAL   Final   Performed at [facility], [place] [address] [place], [place] [address]  Hospital Outpatient Visit on 04/23/2019  Component Date Value Ref Range Status  . aPTT 04/23/2019 30  24 - 36 seconds Final   Performed at [facility], [place] [address] [place], [place] [address]  . WBC 04/23/2019 4.7  4.0 - 10.5 K/uL Final  . RBC 04/23/2019 4.28  3.87 - 5.11 MIL/uL Final  . Hemoglobin 04/23/2019 12.9  12.0 - 15.0 g/dL Final  . HCT 04/23/2019 39.9  36.0 - 46.0 % Final  . MCV 04/23/2019 93.2  80.0 - 100.0 fL Final  . MCH 04/23/2019 30.1  26.0 - 34.0 pg Final  . MCHC 04/23/2019 32.3  30.0 - 36.0 g/dL Final  . RDW 04/23/2019 13.2  11.5 - 15.5 % Final  . Platelets 04/23/2019 231  150 - 400 K/uL Final  . nRBC 04/23/2019 0.0  0.0 - 0.2 % Final   Performed at [facility], [place] [address] [place], [place] [address]  . Sodium 04/23/2019 139  135 - 145 mmol/L Final  . Potassium 04/23/2019 2.9* 3.5 - 5.1 mmol/L Final  . Chloride 04/23/2019 98  98 - 111 mmol/L Final  . CO2 04/23/2019 31  22 - 32 mmol/L Final  . Glucose, Bld 04/23/2019 110* 70 - 99 mg/dL Final  . BUN 04/23/2019 21  8 - 23 mg/dL Final  . Creatinine, Ser 04/23/2019 1.13* 0.44 - 1.00 mg/dL Final  . Calcium 04/23/2019 8.8* 8.9 - 10.3 mg/dL Final  .  Total Protein 04/23/2019 7.2  6.5 - 8.1 g/dL Final  . Albumin 04/23/2019 4.4  3.5 - 5.0 g/dL Final  . AST 04/23/2019 22  15 - 41 U/L Final  . ALT 04/23/2019 17  0 - 44 U/L Final  . Alkaline Phosphatase 04/23/2019 44  38 - 126 U/L Final  . Total Bilirubin 04/23/2019 0.8  0.3 - 1.2 mg/dL Final  . GFR calc non Af Amer 04/23/2019 48* >60 mL/min Final  . GFR calc Af Amer 04/23/2019 55* >60 mL/min Final  . Anion gap 04/23/2019 10  5 - 15 Final   Performed at [facility], [place] [address] [place], [place] [address]  . Prothrombin Time 04/23/2019 12.2  11.4 - 15.2 seconds Final  . INR 04/23/2019 0.9  0.8 - 1.2 Final   Comment: (NOTE) INR goal varies based on device and disease states. Performed at [facility], [place] [address] [place], [place] [address]   . ABO/RH(D) 04/23/2019 A POS   Final  . Antibody Screen 04/23/2019 NEG   Final  . Sample Expiration 04/23/2019 04/30/2019,2359   Final  . Extend sample reason 04/23/2019    Final                   Value:NO TRANSFUSIONS OR PREGNANCY IN THE PAST 3 MONTHS Performed at [place] [address] [place], [place] [address]   . MRSA, PCR 04/23/2019 NEGATIVE  NEGATIVE Final  . Staphylococcus aureus 04/23/2019 NEGATIVE  NEGATIVE Final   Comment: (NOTE) The Xpert SA Assay (FDA approved for NASAL specimens in patients 12 years of age and older), is one component of a comprehensive surveillance program. It is not intended to diagnose infection nor to guide or monitor treatment. Performed at [facility], [place] [address], [place], [place] [address]   Office Visit on 03/12/2019  Component Date Value Ref Range Status  . BNP 03/12/2019 13.0  0.0 - 100.0 pg/mL Final     X-Rays:Dg Pelvis Portable  Result Date: 04/27/2019 CLINICAL DATA:  Osteoarthritis of the left hip. Status post left total hip arthroplasty. EXAM: PORTABLE PELVIS 1-2 VIEWS COMPARISON:  Radiographs dated 02/27/2007 FINDINGS: The acetabular and femoral components of the left total hip prosthesis appear in excellent position in the AP projection. Soft tissue drain in place. No fractures. Previous lower lumbar surgery. No other significant abnormalities. : Satisfactory appearance of the left hip after total hip replacement. Electronically Signed   By: Lorriane Shire M.D.   On: 04/27/2019 17:14   Dg C-arm 1-60 Min-no Report  Result Date: 04/27/2019 Fluoroscopy was utilized  by the requesting physician.  No radiographic interpretation.   Dg Hip Operative Unilat W Or W/o Pelvis Left  Result Date: 04/27/2019 CLINICAL DATA:  Status post left total hip replacement. EXAM: OPERATIVE left HIP (WITH PELVIS IF PERFORMED) 2 VIEWS TECHNIQUE: Fluoroscopic spot image(s) were submitted for interpretation post-operatively. Radiation exposure index: 2.0722 mGy. COMPARISON:  February 27, 2007. FINDINGS: Two intraoperative fluoroscopic images were obtained of the left hip. These demonstrate the left femoral and acetabular components to be well situated. Expected postoperative changes are noted in the surrounding soft tissues. IMPRESSION: Fluoroscopic guidance provided during left total hip arthroplasty. Electronically Signed   By: Marijo Conception M.D.   On: 04/27/2019 17:26    EKG: Orders placed or performed in visit on 03/12/19  . EKG 12-Lead     Hospital Course: Patient was admitted to [facility] and taken  to the OR and underwent the above state procedure without complications.  Patient tolerated the procedure well and was later transferred to the recovery room and then to the orthopaedic floor for postoperative care.  They were given PO and IV analgesics for pain control following their surgery.  They were given 24 hours of postoperative antibiotics of  Anti-infectives (From admission, onward)   Start     Dose/Rate Route Frequency Ordered Stop   04/27/19 2100  ceFAZolin (ANCEF) IVPB 2g/100 mL premix     2 g 200 mL/hr over 30 Minutes Intravenous Every 6 hours 04/27/19 1811 04/28/19 0253   04/27/19 1230  ceFAZolin (ANCEF) IVPB 2g/100 mL premix     2 g 200 mL/hr over 30 Minutes Intravenous On call to O.R. 04/27/19 1217 04/27/19 1447     and started on DVT prophylaxis in the form of Aspirin.   PT and OT were ordered for total hip protocol.  The patient was allowed to be WBAT with therapy. Discharge planning was consulted to help with postop disposition and equipment needs.   Patient had a fair night on the evening of surgery.  They started to get up OOB with therapy on day one.  Hemovac drain was pulled without difficulty.  Continued to work with therapy into day two.  Dressing was changed on day two and the incision was clean and dry.  By day three, the patient had progressed with therapy and meeting their goals.  Incision was healing well.  Patient was seen in rounds and was ready to go home with HHPT.  Diet: Cardiac diet Activity:WBAT Follow-up:in 2 weeks Disposition - Home Discharged Condition: stable   Discharge Instructions    Call MD / Call 911   Complete by: As directed    If you experience chest pain or shortness of breath, CALL 911 and be transported to the hospital emergency room.  If you develope a fever above 101 F, pus (white drainage) or increased drainage or redness at the wound, or calf pain, call your surgeon's office.   Constipation Prevention   Complete by: As directed    Drink plenty of fluids.  Prune juice may be helpful.  You may use a stool softener, such as Colace (over the counter) 100 mg twice a day.  Use MiraLax (over the counter) for constipation as needed.   Diet - low sodium heart healthy   Complete by: As directed    Discharge instructions   Complete by: As directed    Dr. Gaynelle Arabian Total Joint Specialist [facility] [address] [place], [place] [address] [phone]  ANTERIOR APPROACH TOTAL HIP REPLACEMENT POSTOPERATIVE DIRECTIONS   Hip Rehabilitation, Guidelines Following Surgery  The results of a hip operation are greatly improved after range of motion and muscle strengthening exercises. Follow all safety measures which are given to protect your hip. If any of these exercises cause increased pain or swelling in your joint, decrease the amount until you are comfortable again. Then slowly increase the exercises. Call your caregiver if you have problems or questions.   HOME CARE INSTRUCTIONS  Remove  items at home which could result in a fall. This includes throw rugs or furniture in walking pathways.  ICE to the affected hip every three hours for 30 minutes at a time and then as needed for pain and swelling.  Continue to use ice on the hip for pain and swelling from surgery. You may notice swelling that will progress down to the  foot and ankle.  This is normal after surgery.  Elevate the leg when you are not up walking on it.   Continue to use the breathing machine which will help keep your temperature down.  It is common for your temperature to cycle up and down following surgery, especially at night when you are not up moving around and exerting yourself.  The breathing machine keeps your lungs expanded and your temperature down.   DIET You may resume your previous home diet once your are discharged from the hospital.  DRESSING / WOUND CARE / SHOWERING You may shower 3 days after surgery, but keep the wounds dry during showering.  You may use an occlusive plastic wrap (Press'n Seal for example), NO SOAKING/SUBMERGING IN THE BATHTUB.  If the bandage gets wet, change with a clean dry gauze.  If the incision gets wet, pat the wound dry with a clean towel. You may start showering once you are discharged home but do not submerge the incision under water. Just pat the incision dry and apply a dry gauze dressing on daily. Change the surgical dressing daily and reapply a dry dressing each time.  ACTIVITY Walk with your walker as instructed. Use walker as long as suggested by your caregivers. Avoid periods of inactivity such as sitting longer than an hour when not asleep. This helps prevent blood clots.  You may resume a sexual relationship in one month or when given the OK by your doctor.  You may return to work once you are cleared by your doctor.  Do not drive a car for 6 weeks or until released by you surgeon.  Do not drive while taking narcotics.  WEIGHT BEARING Weight bearing as tolerated  with assist device (walker, cane, etc) as directed, use it as long as suggested by your surgeon or therapist, typically at least 4-6 weeks.  POSTOPERATIVE CONSTIPATION PROTOCOL Constipation - defined medically as fewer than three stools per week and severe constipation as less than one stool per week.  One of the most common issues patients have following surgery is constipation.  Even if you have a regular bowel pattern at home, your normal regimen is likely to be disrupted due to multiple reasons following surgery.  Combination of anesthesia, postoperative narcotics, change in appetite and fluid intake all can affect your bowels.  In order to avoid complications following surgery, here are some recommendations in order to help you during your recovery period.  Colace (docusate) - Pick up an over-the-counter form of Colace or another stool softener and take twice a day as long as you are requiring postoperative pain medications.  Take with a full glass of water daily.  If you experience loose stools or diarrhea, hold the colace until you stool forms back up.  If your symptoms do not get better within 1 week or if they get worse, check with your doctor.  Dulcolax (bisacodyl) - Pick up over-the-counter and take as directed by the product packaging as needed to assist with the movement of your bowels.  Take with a full glass of water.  Use this product as needed if not relieved by Colace only.   MiraLax (polyethylene glycol) - Pick up over-the-counter to have on hand.  MiraLax is a solution that will increase the amount of water in your bowels to assist with bowel movements.  Take as directed and can mix with a glass of water, juice, soda, coffee, or tea.  Take if you go more than two days without  a movement. Do not use MiraLax more than once per day. Call your doctor if you are still constipated or irregular after using this medication for 7 days in a row.  If you continue to have problems with  postoperative constipation, please contact the office for further assistance and recommendations.  If you experience "the worst abdominal pain ever" or develop nausea or vomiting, please contact the office immediatly for further recommendations for treatment.  ITCHING  If you experience itching with your medications, try taking only a single pain pill, or even half a pain pill at a time.  You can also use Benadryl over the counter for itching or also to help with sleep.   TED HOSE STOCKINGS Wear the elastic stockings on both legs for three weeks following surgery during the day but you may remove then at night for sleeping.  MEDICATIONS See your medication summary on the "After Visit Summary" that the nursing staff will review with you prior to discharge.  You may have some home medications which will be placed on hold until you complete the course of blood thinner medication.  It is important for you to complete the blood thinner medication as prescribed by your surgeon.  Continue your approved medications as instructed at time of discharge.  PRECAUTIONS If you experience chest pain or shortness of breath - call 911 immediately for transfer to the hospital emergency department.  If you develop a fever greater that 101 F, purulent drainage from wound, increased redness or drainage from wound, foul odor from the wound/dressing, or calf pain - CONTACT YOUR SURGEON.                                                   FOLLOW-UP APPOINTMENTS Make sure you keep all of your appointments after your operation with your surgeon and caregivers. You should call the office at the above phone number and make an appointment for approximately two weeks after the date of your surgery or on the date instructed by your surgeon outlined in the "After Visit Summary".  RANGE OF MOTION AND STRENGTHENING EXERCISES  These exercises are designed to help you keep full movement of your hip joint. Follow your caregiver's or  physical therapist's instructions. Perform all exercises about fifteen times, three times per day or as directed. Exercise both hips, even if you have had only one joint replacement. These exercises can be done on a training (exercise) mat, on the floor, on a table or on a bed. Use whatever works the best and is most comfortable for you. Use music or television while you are exercising so that the exercises are a pleasant break in your day. This will make your life better with the exercises acting as a break in routine you can look forward to.  Lying on your back, slowly slide your foot toward your buttocks, raising your knee up off the floor. Then slowly slide your foot back down until your leg is straight again.  Lying on your back spread your legs as far apart as you can without causing discomfort.  Lying on your side, raise your upper leg and foot straight up from the floor as far as is comfortable. Slowly lower the leg and repeat.  Lying on your back, tighten up the muscle in the front of your thigh (quadriceps muscles). You can do  this by keeping your leg straight and trying to raise your heel off the floor. This helps strengthen the largest muscle supporting your knee.  Lying on your back, tighten up the muscles of your buttocks both with the legs straight and with the knee bent at a comfortable angle while keeping your heel on the floor.   IF YOU ARE TRANSFERRED TO A SKILLED REHAB FACILITY If the patient is transferred to a skilled rehab facility following release from the hospital, a list of the current medications will be sent to the facility for the patient to continue.  When discharged from the skilled rehab facility, please have the facility set up the patient's [place] prior to being released. Also, the skilled facility will be responsible for providing the patient with their medications at time of release from the facility to include their pain medication, the muscle  relaxants, and their blood thinner medication. If the patient is still at the rehab facility at time of the two week follow up appointment, the skilled rehab facility will also need to assist the patient in arranging follow up appointment in our office and any transportation needs.  MAKE SURE YOU:  Understand these instructions.  Get help right away if you are not doing well or get worse.    Pick up stool softner and laxative for home use following surgery while on pain medications. Do not submerge incision under water. Please use good hand washing techniques while changing dressing each day. May shower starting three days after surgery. Please use a clean towel to pat the incision dry following showers. Continue to use ice for pain and swelling after surgery. Do not use any lotions or creams on the incision until instructed by your surgeon.   Increase activity slowly as tolerated   Complete by: As directed      Allergies as of 04/30/2019      Reactions   Morphine And Related Swelling   5-alpha Reductase Inhibitors    Amitriptyline Swelling   Leg swelling   Gabapentin Itching   Triamterene Itching, Rash      Medication List    STOP taking these medications   oxyCODONE-acetaminophen 10-325 MG tablet Commonly known as: PERCOCET     TAKE these medications   aspirin 325 MG EC tablet Take 1 tablet (325 mg total) by mouth 2 (two) times daily. Notes to patient: Blood thinner   bisacodyl 5 MG EC tablet Commonly known as: DULCOLAX Take 5 mg by mouth daily as needed for mild constipation.   carisoprodol 350 MG tablet Commonly known as: SOMA Take 175 mg by mouth 3 (three) times daily.   dorzolamide-timolol 22.3-6.8 MG/ML ophthalmic solution Commonly known as: COSOPT Place 1 drop into the left eye 2 (two) times daily.   estradiol 0.5 MG tablet Commonly known as: ESTRACE Take 0.5 mg by mouth at bedtime.   fenofibrate 160 MG tablet Take 1 tablet by mouth once daily    ferrous sulfate 325 (65 FE) MG tablet Take 1 tablet (325 mg total) by mouth 2 (two) times daily with a meal.   furosemide 40 MG tablet Commonly known as: LASIX Take 1 tablet (40 mg total) by mouth daily.   hydrochlorothiazide 25 MG tablet Commonly known as: HYDRODIURIL Take 25 mg by mouth daily.   levothyroxine 88 MCG tablet Commonly known as: SYNTHROID TAKE 1 TABLET BY MOUTH  DAILY BEFORE BREAKFAST What changed: See the new instructions.   potassium chloride SA 20 MEQ tablet Commonly  known as: KLOR-CON TAKE 3 TABLETS BY MOUTH  DAILY   simvastatin 20 MG tablet Commonly known as: ZOCOR Take 1 tablet (20 mg total) by mouth at bedtime.   traZODone 50 MG tablet Commonly known as: DESYREL Take 1 tablet (50 mg total) by mouth at bedtime.      Follow-up Information    Gaynelle Arabian, MD. Go on 05/12/2019.   Specialty: Orthopedic Surgery Why: You are scheduled for a post-operative appointment on 05-12-19 at 2:45 pm.  Contact information: [address] [place] [address] [phone]        [facility] Follow up.   Specialty: [facility] Contact information: [address] [place] [address] [phone]           Signed: Ardeen Jourdain, PA-C Orthopaedic Surgery 05/04/2019, 7:45 AM

## 2019-05-06 DIAGNOSIS — E785 Hyperlipidemia, unspecified: Secondary | ICD-10-CM | POA: Diagnosis not present

## 2019-05-06 DIAGNOSIS — Z471 Aftercare following joint replacement surgery: Secondary | ICD-10-CM | POA: Diagnosis not present

## 2019-05-06 DIAGNOSIS — M19012 Primary osteoarthritis, left shoulder: Secondary | ICD-10-CM | POA: Diagnosis not present

## 2019-05-06 DIAGNOSIS — Z96642 Presence of left artificial hip joint: Secondary | ICD-10-CM | POA: Diagnosis not present

## 2019-05-06 DIAGNOSIS — I10 Essential (primary) hypertension: Secondary | ICD-10-CM | POA: Diagnosis not present

## 2019-05-06 DIAGNOSIS — D649 Anemia, unspecified: Secondary | ICD-10-CM | POA: Diagnosis not present

## 2019-05-06 DIAGNOSIS — K219 Gastro-esophageal reflux disease without esophagitis: Secondary | ICD-10-CM | POA: Diagnosis not present

## 2019-05-06 DIAGNOSIS — M1711 Unilateral primary osteoarthritis, right knee: Secondary | ICD-10-CM | POA: Diagnosis not present

## 2019-05-06 DIAGNOSIS — M48061 Spinal stenosis, lumbar region without neurogenic claudication: Secondary | ICD-10-CM | POA: Diagnosis not present

## 2019-05-06 DIAGNOSIS — I700 Atherosclerosis of aorta: Secondary | ICD-10-CM | POA: Diagnosis not present

## 2019-05-06 DIAGNOSIS — G4730 Sleep apnea, unspecified: Secondary | ICD-10-CM | POA: Diagnosis not present

## 2019-05-06 DIAGNOSIS — M19011 Primary osteoarthritis, right shoulder: Secondary | ICD-10-CM | POA: Diagnosis not present

## 2019-05-06 DIAGNOSIS — G4700 Insomnia, unspecified: Secondary | ICD-10-CM | POA: Diagnosis not present

## 2019-05-06 DIAGNOSIS — E039 Hypothyroidism, unspecified: Secondary | ICD-10-CM | POA: Diagnosis not present

## 2019-05-06 DIAGNOSIS — Z86010 Personal history of colonic polyps: Secondary | ICD-10-CM | POA: Diagnosis not present

## 2019-05-06 DIAGNOSIS — E041 Nontoxic single thyroid nodule: Secondary | ICD-10-CM | POA: Diagnosis not present

## 2019-05-06 DIAGNOSIS — R911 Solitary pulmonary nodule: Secondary | ICD-10-CM | POA: Diagnosis not present

## 2019-05-06 DIAGNOSIS — K5903 Drug induced constipation: Secondary | ICD-10-CM | POA: Diagnosis not present

## 2019-05-08 DIAGNOSIS — Z86010 Personal history of colonic polyps: Secondary | ICD-10-CM | POA: Diagnosis not present

## 2019-05-08 DIAGNOSIS — Z471 Aftercare following joint replacement surgery: Secondary | ICD-10-CM | POA: Diagnosis not present

## 2019-05-08 DIAGNOSIS — E041 Nontoxic single thyroid nodule: Secondary | ICD-10-CM | POA: Diagnosis not present

## 2019-05-08 DIAGNOSIS — E785 Hyperlipidemia, unspecified: Secondary | ICD-10-CM | POA: Diagnosis not present

## 2019-05-08 DIAGNOSIS — I10 Essential (primary) hypertension: Secondary | ICD-10-CM | POA: Diagnosis not present

## 2019-05-08 DIAGNOSIS — K5903 Drug induced constipation: Secondary | ICD-10-CM | POA: Diagnosis not present

## 2019-05-08 DIAGNOSIS — G4700 Insomnia, unspecified: Secondary | ICD-10-CM | POA: Diagnosis not present

## 2019-05-08 DIAGNOSIS — M19012 Primary osteoarthritis, left shoulder: Secondary | ICD-10-CM | POA: Diagnosis not present

## 2019-05-08 DIAGNOSIS — K219 Gastro-esophageal reflux disease without esophagitis: Secondary | ICD-10-CM | POA: Diagnosis not present

## 2019-05-08 DIAGNOSIS — M48061 Spinal stenosis, lumbar region without neurogenic claudication: Secondary | ICD-10-CM | POA: Diagnosis not present

## 2019-05-08 DIAGNOSIS — D649 Anemia, unspecified: Secondary | ICD-10-CM | POA: Diagnosis not present

## 2019-05-08 DIAGNOSIS — Z96642 Presence of left artificial hip joint: Secondary | ICD-10-CM | POA: Diagnosis not present

## 2019-05-08 DIAGNOSIS — M19011 Primary osteoarthritis, right shoulder: Secondary | ICD-10-CM | POA: Diagnosis not present

## 2019-05-08 DIAGNOSIS — I700 Atherosclerosis of aorta: Secondary | ICD-10-CM | POA: Diagnosis not present

## 2019-05-08 DIAGNOSIS — R911 Solitary pulmonary nodule: Secondary | ICD-10-CM | POA: Diagnosis not present

## 2019-05-08 DIAGNOSIS — M1711 Unilateral primary osteoarthritis, right knee: Secondary | ICD-10-CM | POA: Diagnosis not present

## 2019-05-08 DIAGNOSIS — E039 Hypothyroidism, unspecified: Secondary | ICD-10-CM | POA: Diagnosis not present

## 2019-05-08 DIAGNOSIS — G4730 Sleep apnea, unspecified: Secondary | ICD-10-CM | POA: Diagnosis not present

## 2019-05-12 ENCOUNTER — Other Ambulatory Visit: Payer: Self-pay | Admitting: *Deleted

## 2019-05-12 DIAGNOSIS — I700 Atherosclerosis of aorta: Secondary | ICD-10-CM | POA: Diagnosis not present

## 2019-05-12 DIAGNOSIS — K219 Gastro-esophageal reflux disease without esophagitis: Secondary | ICD-10-CM | POA: Diagnosis not present

## 2019-05-12 DIAGNOSIS — R600 Localized edema: Secondary | ICD-10-CM

## 2019-05-12 DIAGNOSIS — I10 Essential (primary) hypertension: Secondary | ICD-10-CM | POA: Diagnosis not present

## 2019-05-12 DIAGNOSIS — M48061 Spinal stenosis, lumbar region without neurogenic claudication: Secondary | ICD-10-CM | POA: Diagnosis not present

## 2019-05-12 DIAGNOSIS — M1711 Unilateral primary osteoarthritis, right knee: Secondary | ICD-10-CM | POA: Diagnosis not present

## 2019-05-12 DIAGNOSIS — G4730 Sleep apnea, unspecified: Secondary | ICD-10-CM | POA: Diagnosis not present

## 2019-05-12 DIAGNOSIS — G4700 Insomnia, unspecified: Secondary | ICD-10-CM | POA: Diagnosis not present

## 2019-05-12 DIAGNOSIS — Z86010 Personal history of colonic polyps: Secondary | ICD-10-CM | POA: Diagnosis not present

## 2019-05-12 DIAGNOSIS — M19012 Primary osteoarthritis, left shoulder: Secondary | ICD-10-CM | POA: Diagnosis not present

## 2019-05-12 DIAGNOSIS — E041 Nontoxic single thyroid nodule: Secondary | ICD-10-CM | POA: Diagnosis not present

## 2019-05-12 DIAGNOSIS — Z96642 Presence of left artificial hip joint: Secondary | ICD-10-CM | POA: Diagnosis not present

## 2019-05-12 DIAGNOSIS — E039 Hypothyroidism, unspecified: Secondary | ICD-10-CM | POA: Diagnosis not present

## 2019-05-12 DIAGNOSIS — R911 Solitary pulmonary nodule: Secondary | ICD-10-CM | POA: Diagnosis not present

## 2019-05-12 DIAGNOSIS — M19011 Primary osteoarthritis, right shoulder: Secondary | ICD-10-CM | POA: Diagnosis not present

## 2019-05-12 DIAGNOSIS — K5903 Drug induced constipation: Secondary | ICD-10-CM | POA: Diagnosis not present

## 2019-05-12 DIAGNOSIS — D649 Anemia, unspecified: Secondary | ICD-10-CM | POA: Diagnosis not present

## 2019-05-12 DIAGNOSIS — Z471 Aftercare following joint replacement surgery: Secondary | ICD-10-CM | POA: Diagnosis not present

## 2019-05-12 DIAGNOSIS — E785 Hyperlipidemia, unspecified: Secondary | ICD-10-CM | POA: Diagnosis not present

## 2019-05-12 MED ORDER — FUROSEMIDE 40 MG PO TABS: 40.0000 mg | ORAL_TABLET | Freq: Every day | ORAL | 3 refills | Status: DC

## 2019-05-14 DIAGNOSIS — M25562 Pain in left knee: Secondary | ICD-10-CM | POA: Diagnosis not present

## 2019-05-15 DIAGNOSIS — E041 Nontoxic single thyroid nodule: Secondary | ICD-10-CM | POA: Diagnosis not present

## 2019-05-15 DIAGNOSIS — K5903 Drug induced constipation: Secondary | ICD-10-CM | POA: Diagnosis not present

## 2019-05-15 DIAGNOSIS — M19012 Primary osteoarthritis, left shoulder: Secondary | ICD-10-CM | POA: Diagnosis not present

## 2019-05-15 DIAGNOSIS — Z471 Aftercare following joint replacement surgery: Secondary | ICD-10-CM | POA: Diagnosis not present

## 2019-05-15 DIAGNOSIS — M19011 Primary osteoarthritis, right shoulder: Secondary | ICD-10-CM | POA: Diagnosis not present

## 2019-05-15 DIAGNOSIS — Z96642 Presence of left artificial hip joint: Secondary | ICD-10-CM | POA: Diagnosis not present

## 2019-05-15 DIAGNOSIS — Z86010 Personal history of colonic polyps: Secondary | ICD-10-CM | POA: Diagnosis not present

## 2019-05-15 DIAGNOSIS — R911 Solitary pulmonary nodule: Secondary | ICD-10-CM | POA: Diagnosis not present

## 2019-05-15 DIAGNOSIS — K219 Gastro-esophageal reflux disease without esophagitis: Secondary | ICD-10-CM | POA: Diagnosis not present

## 2019-05-15 DIAGNOSIS — E039 Hypothyroidism, unspecified: Secondary | ICD-10-CM | POA: Diagnosis not present

## 2019-05-15 DIAGNOSIS — I700 Atherosclerosis of aorta: Secondary | ICD-10-CM | POA: Diagnosis not present

## 2019-05-15 DIAGNOSIS — M1711 Unilateral primary osteoarthritis, right knee: Secondary | ICD-10-CM | POA: Diagnosis not present

## 2019-05-15 DIAGNOSIS — G4700 Insomnia, unspecified: Secondary | ICD-10-CM | POA: Diagnosis not present

## 2019-05-15 DIAGNOSIS — E785 Hyperlipidemia, unspecified: Secondary | ICD-10-CM | POA: Diagnosis not present

## 2019-05-15 DIAGNOSIS — I10 Essential (primary) hypertension: Secondary | ICD-10-CM | POA: Diagnosis not present

## 2019-05-15 DIAGNOSIS — M48061 Spinal stenosis, lumbar region without neurogenic claudication: Secondary | ICD-10-CM | POA: Diagnosis not present

## 2019-05-15 DIAGNOSIS — D649 Anemia, unspecified: Secondary | ICD-10-CM | POA: Diagnosis not present

## 2019-05-15 DIAGNOSIS — G4730 Sleep apnea, unspecified: Secondary | ICD-10-CM | POA: Diagnosis not present

## 2019-05-26 ENCOUNTER — Other Ambulatory Visit: Payer: Self-pay | Admitting: Family Medicine

## 2019-05-26 MED ORDER — POTASSIUM CHLORIDE CRYS ER 20 MEQ PO TBCR: 60.0000 meq | EXTENDED_RELEASE_TABLET | Freq: Every day | ORAL | 0 refills | Status: DC

## 2019-05-26 NOTE — Telephone Encounter (Signed)
Medication Refill - Medication:  potassium chloride SA (K-DUR) 20 MEQ tablet   Has the patient contacted their pharmacy? Yes advised to call office. Pt only has enough till tomorrow morning. Pt is requesting 7 day supply till [facility] can fill in a week.   Preferred Pharmacy (with phone number or street name):  [place], [place] - [id number] [phone] (Phone) [phone] (Fax)   Agent: Please be advised that RX refills may take up to 3 business days. We ask that you follow-up with your pharmacy.

## 2019-05-26 NOTE — Telephone Encounter (Signed)
7 day supply sent to pharmacy

## 2019-05-27 ENCOUNTER — Other Ambulatory Visit: Payer: Self-pay

## 2019-05-28 ENCOUNTER — Encounter: Payer: Self-pay | Admitting: Family Medicine

## 2019-05-28 ENCOUNTER — Ambulatory Visit (INDEPENDENT_AMBULATORY_CARE_PROVIDER_SITE_OTHER): Payer: Medicare Other | Admitting: Family Medicine

## 2019-05-28 VITALS — BP 120/62 | HR 64 | Temp 97.0°F | Resp 16 | Ht 60.0 in | Wt 162.0 lb

## 2019-05-28 DIAGNOSIS — L089 Local infection of the skin and subcutaneous tissue, unspecified: Secondary | ICD-10-CM | POA: Diagnosis not present

## 2019-05-28 DIAGNOSIS — T148XXA Other injury of unspecified body region, initial encounter: Secondary | ICD-10-CM

## 2019-05-28 DIAGNOSIS — E876 Hypokalemia: Secondary | ICD-10-CM | POA: Diagnosis not present

## 2019-05-28 LAB — BASIC METABOLIC PANEL
BUN: 18 mg/dL (ref 6–23)
CO2: 33 mEq/L — ABNORMAL HIGH (ref 19–32)
Calcium: 8.9 mg/dL (ref 8.4–10.5)
Chloride: 101 mEq/L (ref 96–112)
Creatinine, Ser: 1.07 mg/dL (ref 0.40–1.20)
GFR: 50.05 mL/min — ABNORMAL LOW (ref >60.00)
Glucose, Bld: 114 mg/dL — ABNORMAL HIGH (ref 70–99)
Potassium: 3.6 mEq/L (ref 3.5–5.1)
Sodium: 141 mEq/L (ref 135–145)

## 2019-05-28 MED ORDER — CEPHALEXIN 500 MG PO CAPS: 500.0000 mg | ORAL_CAPSULE | Freq: Four times a day (QID) | ORAL | 0 refills | Status: DC

## 2019-05-28 NOTE — Assessment & Plan Note (Signed)
abx per orders and f/u with ortho if no improvement

## 2019-05-28 NOTE — Progress Notes (Signed)
Patient ID: Joan [name], female    DOB: 1946-02-27  Age: 73 y.o. MRN: [id number]    Subjective:  Subjective  HPIf TEVIS VARISCO presents for f/u low potassium in hospital --- she is s/p L hip replacement .  She also c/o wound at surgical site.   Ortho saw it and put streri strips on but the pt does not think it is getting better.  No fevers   No inc in pain  Review of Systems  Constitutional: Negative for appetite change, diaphoresis, fatigue and unexpected weight change.  Eyes: Negative for pain, redness and visual disturbance.  Respiratory: Negative for cough, chest tightness, shortness of breath and wheezing.   Cardiovascular: Negative for chest pain, palpitations and leg swelling.  Endocrine: Negative for cold intolerance, heat intolerance, polydipsia, polyphagia and polyuria.  Genitourinary: Negative for difficulty urinating, dysuria and frequency.  Skin: Positive for wound. Negative for rash.  Neurological: Negative for dizziness, light-headedness, numbness and headaches.    History Past Medical History:  Diagnosis Date   Arthritis    "shoulders; wrist; probably spine" (06/24/2013)   Chronic low back pain    followed by Dr Hardin Negus pain mgt   Colon polyp    Constipation    Esophageal stricture    Finger pain, left    2 fingers on left hand since wrist surgery   GERD (gastroesophageal reflux disease)    Heart murmur    "slight; not on RX" (06/24/2013)   History of cardiac arrhythmia    cardiologist- Traci Turner   Hx of colonic polyps 08/28/2004   Hyperlipemia    Hypertension    Hypothyroidism    Osteoarthritis of left knee    advanced   PONV (postoperative nausea and vomiting)    Pt reports symptoms are the result of gallbladder and cholecystectomy, not anesthesia   PVC's (premature ventricular contractions)    Sleep apnea 1990's   "tested; tried mask; couldn't stand it; told me as long as I slept on my side I'd be ok" (06/24/2013)    Thyroid nodule    Tobacco abuse     She has a past surgical history that includes Lumbar fusion; Cesarean section (1972); Ankle surgery (Left, 1995); Infusion pump implantation (1990's); Knee arthroscopy (Left, 1991; ~ 1993); Elbow surgery (1990's); Thyroidectomy (2012); Cervical spine surgery (2012); Foot surgery (Right, 2012); Shoulder arthroscopy (Left, 09/2011); Lumbar laminectomy/decompression microdiscectomy (N/A, 11/28/2012); Back surgery; Cholecystectomy (N/A, 04/16/2013); Laparoscopic lysis of adhesions (N/A, 04/16/2013); Posterior fusion lumbar spine (06/24/2013); Abdominal hysterectomy (1988); ORIF distal radius fracture (Left, 12/30/2013); ORIF wrist fracture (Left, 12/30/2013); Robotic assisted salpingo oophorectomy (Bilateral, 08/20/2014); Abdominal hysterectomy; Total knee arthroplasty (Right, 09/09/2017); and Total hip arthroplasty (Left, 04/27/2019).   Her family history includes Bone cancer in her sister; Breast cancer in her maternal aunt and sister; Cancer in her father; Coronary artery disease (age of onset: 68) in her mother; Diabetes in her mother and sister; Diabetes type II in her mother; Hypertension in her brother, mother, and sister; Pancreatic cancer in her father; Skin cancer in her mother; Thyroid cancer in her mother.She reports that she has been smoking cigarettes. She has a 50.00 pack-year smoking history. She has never used smokeless tobacco. She reports that she does not drink alcohol or use drugs.  Current Outpatient Medications on File Prior to Visit  Medication Sig Dispense Refill   aspirin EC 325 MG EC tablet Take 1 tablet (325 mg total) by mouth 2 (two) times daily. 40 tablet 0   bisacodyl (DULCOLAX) 5  MG EC tablet Take 5 mg by mouth daily as needed for mild constipation.  30 tablet    carisoprodol (SOMA) 350 MG tablet Take 175 mg by mouth 3 (three) times daily.      dorzolamide-timolol (COSOPT) 22.3-6.8 MG/ML ophthalmic solution Place 1 drop into the left eye 2  (two) times daily.     estradiol (ESTRACE) 0.5 MG tablet Take 0.5 mg by mouth at bedtime.   4   fenofibrate 160 MG tablet Take 1 tablet by mouth once daily (Patient taking differently: Take 160 mg by mouth daily. ) 90 tablet 1   furosemide (LASIX) 40 MG tablet Take 1 tablet (40 mg total) by mouth daily. 30 tablet 3   hydrochlorothiazide (HYDRODIURIL) 25 MG tablet Take 25 mg by mouth daily.      levothyroxine (SYNTHROID) 88 MCG tablet TAKE 1 TABLET BY MOUTH  DAILY BEFORE BREAKFAST (Patient taking differently: Take 88 mcg by mouth daily before breakfast. ) 90 tablet 3   oxyCODONE-acetaminophen (PERCOCET) 10-325 MG tablet Take 1 tablet by mouth every 4 (four) hours as needed.     potassium chloride SA (KLOR-CON) 20 MEQ tablet Take 3 tablets (60 mEq total) by mouth daily. 7 tablet 0   simvastatin (ZOCOR) 20 MG tablet Take 1 tablet (20 mg total) by mouth at bedtime. 90 tablet 1   traZODone (DESYREL) 50 MG tablet Take 1 tablet (50 mg total) by mouth at bedtime. 90 tablet 1   No current facility-administered medications on file prior to visit.      Objective:  Objective  Physical Exam Vitals signs and nursing note reviewed.  Constitutional:      Appearance: She is well-developed.  HENT:     Head: Normocephalic and atraumatic.  Eyes:     Conjunctiva/sclera: Conjunctivae normal.  Neck:     Musculoskeletal: Normal range of motion and neck supple.     Thyroid: No thyromegaly.     Vascular: No carotid bruit or JVD.  Cardiovascular:     Rate and Rhythm: Normal rate and regular rhythm.     Heart sounds: Normal heart sounds. No murmur.  Pulmonary:     Effort: Pulmonary effort is normal. No respiratory distress.     Breath sounds: Normal breath sounds. No wheezing or rales.  Chest:     Chest wall: No tenderness.  Skin:    Findings: Lesion present.       Neurological:     Mental Status: She is alert and oriented to person, place, and time.    BP 120/62 (BP Location: Left Arm,  Patient Position: Sitting, Cuff Size: Small)    Pulse 64    Temp (!) 97 F (36.1 C) (Temporal)    Resp 16    Ht 5' (1.524 m)    Wt 162 lb (73.5 kg)    SpO2 99%    BMI 31.64 kg/m  Wt Readings from Last 3 Encounters:  05/28/19 162 lb (73.5 kg)  04/27/19 169 lb 1.5 oz (76.7 kg)  04/23/19 169 lb 2 oz (76.7 kg)     Lab Results  Component Value Date   WBC 7.2 04/30/2019   HGB 8.6 (L) 04/30/2019   HCT 26.9 (L) 04/30/2019   PLT 144 (L) 04/30/2019   GLUCOSE 148 (H) 04/29/2019   CHOL 157 02/10/2019   TRIG 113.0 02/10/2019   HDL 55.60 02/10/2019   LDLDIRECT 106.0 11/29/2015   LDLCALC 79 02/10/2019   ALT 15 04/27/2019   AST 20 04/27/2019   NA 137  04/29/2019   K 3.8 04/29/2019   CL 108 04/29/2019   CREATININE 0.78 04/29/2019   BUN 13 04/29/2019   CO2 23 04/29/2019   TSH 4.23 02/10/2019   INR 0.9 04/23/2019   HGBA1C 6.0 12/30/2017    No results found.   Assessment & Plan:  Plan  I have discontinued Eira M. Kenedy's ferrous sulfate. I am also having her start on cephALEXin. Additionally, I am having her maintain her carisoprodol, bisacodyl, dorzolamide-timolol, estradiol, traZODone, simvastatin, fenofibrate, hydrochlorothiazide, levothyroxine, aspirin, furosemide, potassium chloride SA, and oxyCODONE-acetaminophen.  Meds ordered this encounter  Medications   cephALEXin (KEFLEX) 500 MG capsule    Sig: Take 1 capsule (500 mg total) by mouth 4 (four) times daily.    Dispense:  20 capsule    Refill:  0    Problem List Items Addressed This Visit      Unprioritized   Hypokalemia - Primary    Recheck today      Relevant Orders   Basic metabolic panel   Wound infection    abx per orders and f/u with ortho if no improvement      Relevant Medications   cephALEXin (KEFLEX) 500 MG capsule      Follow-up: Return in about 3 months (around 08/28/2019), or if symptoms worsen or fail to improve, for hypertension, hyperlipidemia.  [name], DO

## 2019-05-28 NOTE — Patient Instructions (Signed)

## 2019-05-28 NOTE — Assessment & Plan Note (Signed)
Recheck today. 

## 2019-06-09 DIAGNOSIS — Z96642 Presence of left artificial hip joint: Secondary | ICD-10-CM | POA: Diagnosis not present

## 2019-06-09 DIAGNOSIS — Z471 Aftercare following joint replacement surgery: Secondary | ICD-10-CM | POA: Diagnosis not present

## 2019-06-16 DIAGNOSIS — G5702 Lesion of sciatic nerve, left lower limb: Secondary | ICD-10-CM | POA: Diagnosis not present

## 2019-06-16 DIAGNOSIS — M25552 Pain in left hip: Secondary | ICD-10-CM | POA: Diagnosis not present

## 2019-06-16 DIAGNOSIS — M961 Postlaminectomy syndrome, not elsewhere classified: Secondary | ICD-10-CM | POA: Diagnosis not present

## 2019-06-16 DIAGNOSIS — G894 Chronic pain syndrome: Secondary | ICD-10-CM | POA: Diagnosis not present

## 2019-06-16 DIAGNOSIS — Z79891 Long term (current) use of opiate analgesic: Secondary | ICD-10-CM | POA: Diagnosis not present

## 2019-06-16 DIAGNOSIS — M25551 Pain in right hip: Secondary | ICD-10-CM | POA: Diagnosis not present

## 2019-07-14 ENCOUNTER — Other Ambulatory Visit: Payer: Self-pay | Admitting: Family Medicine

## 2019-07-14 DIAGNOSIS — E785 Hyperlipidemia, unspecified: Secondary | ICD-10-CM

## 2019-07-24 ENCOUNTER — Other Ambulatory Visit: Payer: Self-pay | Admitting: Family Medicine

## 2019-07-24 DIAGNOSIS — G4700 Insomnia, unspecified: Secondary | ICD-10-CM

## 2019-08-03 ENCOUNTER — Other Ambulatory Visit: Payer: Self-pay | Admitting: Family Medicine

## 2019-08-09 ENCOUNTER — Other Ambulatory Visit: Payer: Self-pay | Admitting: Family Medicine

## 2019-08-09 DIAGNOSIS — R600 Localized edema: Secondary | ICD-10-CM

## 2019-08-12 ENCOUNTER — Other Ambulatory Visit: Payer: Self-pay

## 2019-08-12 DIAGNOSIS — Z471 Aftercare following joint replacement surgery: Secondary | ICD-10-CM | POA: Diagnosis not present

## 2019-08-12 DIAGNOSIS — M1611 Unilateral primary osteoarthritis, right hip: Secondary | ICD-10-CM | POA: Diagnosis not present

## 2019-08-12 DIAGNOSIS — M1712 Unilateral primary osteoarthritis, left knee: Secondary | ICD-10-CM | POA: Diagnosis not present

## 2019-08-12 DIAGNOSIS — M25551 Pain in right hip: Secondary | ICD-10-CM | POA: Diagnosis not present

## 2019-08-12 DIAGNOSIS — Z96651 Presence of right artificial knee joint: Secondary | ICD-10-CM | POA: Diagnosis not present

## 2019-08-13 ENCOUNTER — Encounter: Payer: Self-pay | Admitting: Family Medicine

## 2019-08-13 ENCOUNTER — Ambulatory Visit (INDEPENDENT_AMBULATORY_CARE_PROVIDER_SITE_OTHER): Payer: Medicare Other | Admitting: Family Medicine

## 2019-08-13 ENCOUNTER — Other Ambulatory Visit: Payer: Self-pay

## 2019-08-13 VITALS — BP 118/70 | HR 69 | Temp 97.5°F | Resp 18 | Ht 60.0 in | Wt 171.6 lb

## 2019-08-13 DIAGNOSIS — E039 Hypothyroidism, unspecified: Secondary | ICD-10-CM | POA: Diagnosis not present

## 2019-08-13 DIAGNOSIS — E876 Hypokalemia: Secondary | ICD-10-CM | POA: Diagnosis not present

## 2019-08-13 DIAGNOSIS — I10 Essential (primary) hypertension: Secondary | ICD-10-CM

## 2019-08-13 DIAGNOSIS — E785 Hyperlipidemia, unspecified: Secondary | ICD-10-CM

## 2019-08-13 LAB — COMPREHENSIVE METABOLIC PANEL
ALT: 14 U/L (ref 0–35)
AST: 18 U/L (ref 0–37)
Albumin: 4.4 g/dL (ref 3.5–5.2)
Alkaline Phosphatase: 57 U/L (ref 39–117)
BUN: 21 mg/dL (ref 6–23)
CO2: 33 mEq/L — ABNORMAL HIGH (ref 19–32)
Calcium: 9.2 mg/dL (ref 8.4–10.5)
Chloride: 98 mEq/L (ref 96–112)
Creatinine, Ser: 1.12 mg/dL (ref 0.40–1.20)
GFR: 47.46 mL/min — ABNORMAL LOW (ref >60.00)
Glucose, Bld: 101 mg/dL — ABNORMAL HIGH (ref 70–99)
Potassium: 3.9 mEq/L (ref 3.5–5.1)
Sodium: 137 mEq/L (ref 135–145)
Total Bilirubin: 0.4 mg/dL (ref 0.2–1.2)
Total Protein: 6.8 g/dL (ref 6.0–8.3)

## 2019-08-13 LAB — LIPID PANEL
Cholesterol: 171 mg/dL (ref 0–200)
HDL: 62.6 mg/dL (ref >39.00)
LDL Cholesterol: 87 mg/dL (ref 0–99)
NonHDL: 108.04
Total CHOL/HDL Ratio: 3
Triglycerides: 104 mg/dL (ref 0.0–149.0)
VLDL: 20.8 mg/dL (ref 0.0–40.0)

## 2019-08-13 NOTE — Assessment & Plan Note (Signed)
Check labs 

## 2019-08-13 NOTE — Assessment & Plan Note (Signed)
Tolerating statin, encouraged heart healthy diet, avoid trans fats, minimize simple carbs and saturated fats. Increase exercise as tolerated 

## 2019-08-13 NOTE — Patient Instructions (Signed)
COVID-19 Vaccine Information can be found at: ShippingScam.co.uk For questions related to vaccine distribution or appointments, please email [email or web address] or call [phone].    Edema  Edema is an abnormal buildup of fluids in the body tissues and under the skin. Swelling of the legs, feet, and ankles is a common symptom that becomes more likely as you get older. Swelling is also common in looser tissues, like around the eyes. When the affected area is squeezed, the fluid may move out of that spot and leave a dent for a few moments. This dent is called pitting edema. There are many possible causes of edema. Eating too much salt (sodium) and being on your feet or sitting for a long time can cause edema in your legs, feet, and ankles. Hot weather may make edema worse. Common causes of edema include:  Heart failure.  Liver or kidney disease.  Weak leg blood vessels.  Cancer.  An injury.  Pregnancy.  Medicines.  Being obese.  Low protein levels in the blood. Edema is usually painless. Your skin may look swollen or shiny. Follow these instructions at home:  Keep the affected body part raised (elevated) above the level of your heart when you are sitting or lying down.  Do not sit still or stand for long periods of time.  Do not wear tight clothing. Do not wear garters on your upper legs.  Exercise your legs to get your circulation going. This helps to move the fluid back into your blood vessels, and it may help the swelling go down.  Wear elastic bandages or support stockings to reduce swelling as told by your health care provider.  Eat a low-salt (low-sodium) diet to reduce fluid as told by your health care provider.  Depending on the cause of your swelling, you may need to limit how much fluid you drink (fluid restriction).  Take over-the-counter and prescription medicines only as told by your health care  provider. Contact a health care provider if:  Your edema does not get better with treatment.  You have heart, liver, or kidney disease and have symptoms of edema.  You have sudden and unexplained weight gain. Get help right away if:  You develop shortness of breath or chest pain.  You cannot breathe when you lie down.  You develop pain, redness, or warmth in the swollen areas.  You have heart, liver, or kidney disease and suddenly get edema.  You have a fever and your symptoms suddenly get worse. Summary  Edema is an abnormal buildup of fluids in the body tissues and under the skin.  Eating too much salt (sodium) and being on your feet or sitting for a long time can cause edema in your legs, feet, and ankles.  Keep the affected body part raised (elevated) above the level of your heart when you are sitting or lying down. This information is not intended to replace advice given to you by your health care provider. Make sure you discuss any questions you have with your health care provider. Document Revised: 11/12/2018 Document Reviewed: 07/28/2016 Elsevier Patient Education  [place].

## 2019-08-13 NOTE — Progress Notes (Signed)
Patient ID: DERESA MULUGETA, female    DOB: [birth year]  Age: 74 y.o. MRN: [id number]    Subjective:  Subjective  HPI  JHANIYAH BONES presents for f/u potassium and bp.  She is getting ready for a hip replacement No other complaints   Review of Systems  Constitutional: Negative for appetite change, diaphoresis, fatigue and unexpected weight change.  Eyes: Negative for pain, redness and visual disturbance.  Respiratory: Negative for cough, chest tightness, shortness of breath and wheezing.   Cardiovascular: Negative for chest pain, palpitations and leg swelling.  Endocrine: Negative for cold intolerance, heat intolerance, polydipsia, polyphagia and polyuria.  Genitourinary: Negative for difficulty urinating, dysuria and frequency.  Musculoskeletal: Positive for arthralgias.  Neurological: Negative for dizziness, light-headedness, numbness and headaches.    History Past Medical History:  Diagnosis Date  . Arthritis    "shoulders; wrist; probably spine" (06/24/2013)  . Chronic low back pain    followed by Dr Hardin Negus pain mgt  . Colon polyp   . Constipation   . Esophageal stricture   . Finger pain, left    2 fingers on left hand since wrist surgery  . GERD (gastroesophageal reflux disease)   . Heart murmur    "slight; not on RX" (06/24/2013)  . History of cardiac arrhythmia    cardiologist- Traci Turner  . Hx of colonic polyps 08/28/2004  . Hyperlipemia   . Hypertension   . Hypothyroidism   . Osteoarthritis of left knee    advanced  . PONV (postoperative nausea and vomiting)    Pt reports symptoms are the result of gallbladder and cholecystectomy, not anesthesia  . PVC's (premature ventricular contractions)   . Sleep apnea 1990's   "tested; tried mask; couldn't stand it; told me as long as I slept on my side I'd be ok" (06/24/2013)  . Thyroid nodule   . Tobacco abuse     She has a past surgical history that includes Lumbar fusion; Cesarean section (1972); Ankle  surgery (Left, 1995); Infusion pump implantation (1990's); Knee arthroscopy (Left, 1991; ~ 1993); Elbow surgery (1990's); Thyroidectomy (2012); Cervical spine surgery (2012); Foot surgery (Right, 2012); Shoulder arthroscopy (Left, 09/2011); Lumbar laminectomy/decompression microdiscectomy (N/A, 11/28/2012); Back surgery; Cholecystectomy (N/A, 04/16/2013); Laparoscopic lysis of adhesions (N/A, 04/16/2013); Posterior fusion lumbar spine (06/24/2013); Abdominal hysterectomy (1988); ORIF distal radius fracture (Left, 12/30/2013); ORIF wrist fracture (Left, 12/30/2013); Robotic assisted salpingo oophorectomy (Bilateral, 08/20/2014); Abdominal hysterectomy; Total knee arthroplasty (Right, 09/09/2017); and Total hip arthroplasty (Left, 04/27/2019).   Her family history includes Bone cancer in her sister; Breast cancer in her maternal aunt and sister; Cancer in her father; Coronary artery disease (age of onset: 52) in her mother; Diabetes in her mother and sister; Diabetes type II in her mother; Hypertension in her brother, mother, and sister; Pancreatic cancer in her father; Skin cancer in her mother; Thyroid cancer in her mother.She reports that she has been smoking cigarettes. She has a 50.00 pack-year smoking history. She has never used smokeless tobacco. She reports that she does not drink alcohol or use drugs.  Current Outpatient Medications on File Prior to Visit  Medication Sig Dispense Refill  . aspirin EC 325 MG EC tablet Take 1 tablet (325 mg total) by mouth 2 (two) times daily. 40 tablet 0  . bisacodyl (DULCOLAX) 5 MG EC tablet Take 5 mg by mouth daily as needed for mild constipation.  30 tablet   . carisoprodol (SOMA) 350 MG tablet Take 175 mg by mouth 3 (three) times daily.     Marland Kitchen  dorzolamide-timolol (COSOPT) 22.3-6.8 MG/ML ophthalmic solution Place 1 drop into the left eye 2 (two) times daily.    Marland Kitchen estradiol (ESTRACE) 0.5 MG tablet Take 0.5 mg by mouth at bedtime.   4  . fenofibrate 160 MG tablet Take 1  tablet by mouth once daily (Patient taking differently: Take 160 mg by mouth daily. ) 90 tablet 1  . furosemide (LASIX) 40 MG tablet Take 1 tablet by mouth once daily 90 tablet 0  . hydrochlorothiazide (HYDRODIURIL) 25 MG tablet TAKE 1 TABLET BY MOUTH  DAILY 90 tablet 3  . levothyroxine (SYNTHROID) 88 MCG tablet TAKE 1 TABLET BY MOUTH  DAILY BEFORE BREAKFAST (Patient taking differently: Take 88 mcg by mouth daily before breakfast. ) 90 tablet 3  . oxyCODONE-acetaminophen (PERCOCET) 10-325 MG tablet Take 1 tablet by mouth every 4 (four) hours as needed.    . potassium chloride SA (KLOR-CON) 20 MEQ tablet Take 3 tablets (60 mEq total) by mouth daily. 7 tablet 0  . simvastatin (ZOCOR) 20 MG tablet TAKE 1 TABLET BY MOUTH AT  BEDTIME 90 tablet 1  . traZODone (DESYREL) 50 MG tablet TAKE 1 TABLET BY MOUTH AT BEDTIME 90 tablet 0   No current facility-administered medications on file prior to visit.     Objective:  Objective  Physical Exam Vitals and nursing note reviewed.  Constitutional:      Appearance: She is well-developed.  HENT:     Head: Normocephalic and atraumatic.  Eyes:     Conjunctiva/sclera: Conjunctivae normal.  Neck:     Thyroid: No thyromegaly.     Vascular: No carotid bruit or JVD.  Cardiovascular:     Rate and Rhythm: Normal rate and regular rhythm.     Heart sounds: Normal heart sounds. No murmur.  Pulmonary:     Effort: Pulmonary effort is normal. No respiratory distress.     Breath sounds: Normal breath sounds. No wheezing or rales.  Chest:     Chest wall: No tenderness.  Musculoskeletal:        General: Swelling present.     Cervical back: Normal range of motion and neck supple.     Right lower leg: Edema present.     Left lower leg: Edema present.  Neurological:     Mental Status: She is alert and oriented to person, place, and time.    BP 118/70 (BP Location: Right Arm, Patient Position: Sitting, Cuff Size: Large)   Pulse 69   Temp (!) 97.5 F (36.4 C)  (Temporal)   Resp 18   Ht 5' (1.524 m)   Wt 171 lb 9.6 oz (77.8 kg)   SpO2 96%   BMI 33.51 kg/m  Wt Readings from Last 3 Encounters:  08/13/19 171 lb 9.6 oz (77.8 kg)  05/28/19 162 lb (73.5 kg)  04/27/19 169 lb 1.5 oz (76.7 kg)     Lab Results  Component Value Date   WBC 7.2 04/30/2019   HGB 8.6 (L) 04/30/2019   HCT 26.9 (L) 04/30/2019   PLT 144 (L) 04/30/2019   GLUCOSE 101 (H) 08/13/2019   CHOL 171 08/13/2019   TRIG 104.0 08/13/2019   HDL 62.60 08/13/2019   LDLDIRECT 106.0 11/29/2015   LDLCALC 87 08/13/2019   ALT 14 08/13/2019   AST 18 08/13/2019   NA 137 08/13/2019   K 3.9 08/13/2019   CL 98 08/13/2019   CREATININE 1.12 08/13/2019   BUN 21 08/13/2019   CO2 33 (H) 08/13/2019   TSH 4.23 02/10/2019  INR 0.9 04/23/2019   HGBA1C 6.0 12/30/2017    No results found.   Assessment & Plan:  Plan  I have discontinued Quincey M. Delancy's cephALEXin. I am also having her maintain her carisoprodol, bisacodyl, dorzolamide-timolol, estradiol, fenofibrate, levothyroxine, aspirin, potassium chloride SA, oxyCODONE-acetaminophen, simvastatin, traZODone, hydrochlorothiazide, and furosemide.  No orders of the defined types were placed in this encounter.   Problem List Items Addressed This Visit      Unprioritized   Essential hypertension    Well controlled, no changes to meds. Encouraged heart healthy diet such as the DASH diet and exercise as tolerated.       Relevant Orders   Comprehensive metabolic panel (Completed)   Hyperlipidemia    Tolerating statin, encouraged heart healthy diet, avoid trans fats, minimize simple carbs and saturated fats. Increase exercise as tolerated      Relevant Orders   Lipid panel (Completed)   Comprehensive metabolic panel (Completed)   Hypokalemia    Check labs       Hypothyroidism - Primary    Check labs       Relevant Orders   Thyroid Panel With TSH      Follow-up: Return if symptoms worsen or fail to improve.  [name], DO

## 2019-08-13 NOTE — Assessment & Plan Note (Signed)
Well controlled, no changes to meds. Encouraged heart healthy diet such as the DASH diet and exercise as tolerated.  °

## 2019-08-14 ENCOUNTER — Telehealth: Payer: Self-pay | Admitting: Family Medicine

## 2019-08-14 ENCOUNTER — Telehealth: Payer: Self-pay | Admitting: Cardiovascular Disease

## 2019-08-14 ENCOUNTER — Other Ambulatory Visit: Payer: Self-pay | Admitting: Family Medicine

## 2019-08-14 DIAGNOSIS — R946 Abnormal results of thyroid function studies: Secondary | ICD-10-CM

## 2019-08-14 DIAGNOSIS — E039 Hypothyroidism, unspecified: Secondary | ICD-10-CM

## 2019-08-14 LAB — THYROID PANEL WITH TSH
Free Thyroxine Index: 3.6 (ref 1.4–3.8)
T3 Uptake: 27 % (ref 22–35)
T4, Total: 13.2 ug/dL — ABNORMAL HIGH (ref 5.1–11.9)
TSH: 4.66 mIU/L — ABNORMAL HIGH (ref 0.40–4.50)

## 2019-08-14 NOTE — Telephone Encounter (Signed)
Please advise 

## 2019-08-14 NOTE — Telephone Encounter (Signed)
   [facility] Pre-operative Risk Assessment    Request for surgical clearance:  1. What type of surgery is being performed? Right total hip arthroplasty   2. When is this surgery scheduled? 09/09/19   3. What type of clearance is required (medical clearance vs. Pharmacy clearance to hold med vs. Both)? Medical  4. Are there any medications that need to be held prior to surgery and how long?Not specified   5. Practice name and name of physician performing surgery?   [facility] Dr. Pilar Plate Aluisio   6. What is your office phone number (336) [phone]    7.   What is your office fax number [phone] ATTN: [place] [facility]  8.   Anesthesia type (None, local, MAC, general) ? Choice   Therisa Doyne 08/14/2019, 1:52 PM  _________________________________________________________________   (provider comments below)

## 2019-08-14 NOTE — Telephone Encounter (Signed)
Caller Name: Akyia Phone: [phone]   Pt called stating she had a call from Endocrinology and doesn't need to see them. She said that she had thyroid removed years ago. She is asking if Dr. Etter Sjogren can and adjust dosage as needed. Pt declined referral.

## 2019-08-14 NOTE — Telephone Encounter (Signed)
She needs endo---- tsh is hypothyroid but t4 is hyperthyroid

## 2019-08-14 NOTE — Telephone Encounter (Signed)
Fransico Him, MD  Eleonore Chiquito, MD  03/12/2019 - LE dopplers w/ no DVT, no further workup needed prior to hip surgery.  Left a voicemail asking her to contact us to make sure still cardiac stable for surgery.   Rosaria Ferries, PA-C 08/14/2019 3:46 PM

## 2019-08-14 NOTE — Telephone Encounter (Signed)
   Primary Cardiologist: Fransico Him, MD  Dr Audie Box, 03/12/2019  Chart reviewed as part of pre-operative protocol coverage. Patient was contacted 08/14/2019 in reference to pre-operative risk assessment for pending surgery as outlined below.  Joan [name] was last seen on 03/12/2019 by Dr Audie Box.  Since that day, Joan [name] has done well from a cardiac standpoint. Significant limitations related to hip pain, no chest pain or SOB. DASI score 3.97.  Therefore, based on ACC/AHA guidelines, the patient would be at acceptable risk for the planned procedure without further cardiovascular testing.   I will route this recommendation to the requesting party via Epic fax function and remove from pre-op pool.  She is no longer on ASA.   Please call with questions.  Rosaria Ferries, PA-C 08/14/2019, 4:08 PM

## 2019-08-17 DIAGNOSIS — H52223 Regular astigmatism, bilateral: Secondary | ICD-10-CM | POA: Diagnosis not present

## 2019-08-17 DIAGNOSIS — H401121 Primary open-angle glaucoma, left eye, mild stage: Secondary | ICD-10-CM | POA: Diagnosis not present

## 2019-08-18 ENCOUNTER — Other Ambulatory Visit: Payer: Self-pay | Admitting: Family Medicine

## 2019-08-18 DIAGNOSIS — Z1231 Encounter for screening mammogram for malignant neoplasm of breast: Secondary | ICD-10-CM

## 2019-08-18 NOTE — Telephone Encounter (Signed)
New referral placed.

## 2019-08-18 NOTE — Telephone Encounter (Signed)
Pt called back today. She has agreed to see Endo. Please resubmit referral. Patient declined previous order. Thanks

## 2019-08-24 ENCOUNTER — Ambulatory Visit: Payer: Medicare Other | Admitting: Internal Medicine

## 2019-08-24 ENCOUNTER — Other Ambulatory Visit: Payer: Self-pay

## 2019-08-26 ENCOUNTER — Other Ambulatory Visit: Payer: Self-pay

## 2019-08-26 ENCOUNTER — Ambulatory Visit: Payer: Medicare Other | Admitting: Internal Medicine

## 2019-08-26 ENCOUNTER — Encounter: Payer: Self-pay | Admitting: Internal Medicine

## 2019-08-26 VITALS — BP 128/68 | HR 76 | Temp 97.7°F | Ht 60.0 in | Wt 171.4 lb

## 2019-08-26 DIAGNOSIS — E890 Postprocedural hypothyroidism: Secondary | ICD-10-CM

## 2019-08-26 LAB — TSH: TSH: 8.05 u[IU]/mL — ABNORMAL HIGH (ref 0.35–4.50)

## 2019-08-26 LAB — T4, FREE: Free T4: 1.49 ng/dL (ref 0.60–1.60)

## 2019-08-26 NOTE — Patient Instructions (Signed)

## 2019-08-26 NOTE — Progress Notes (Signed)
Name: Joan [name]  MRN/ DOB: [id number], 08/24/1945    Age/ Sex: 74 y.o., female    PCP: Carollee Herter, Alferd Apa, DO   Reason for Endocrinology Evaluation: Hypothyroidism     Date of Initial Endocrinology Evaluation: 08/27/2019     HPI: Joan [name] is a 74 y.o. female with a past medical history of HTN, GERD and hypothyroidism . The patient presented for initial endocrinology clinic visit on 08/27/2019 for consultative assistance with her hypothyroidism.   She has been diagnosed with left thyroid nodule in 2010 . She is S/P FNA on the left nodule in 2012 with atypia. She is s/p total thyroidectomy .  Has been on LT-4 replacement since 1982 , her dose has been gradually reduced, currently on 88 mcg daily which as been on since ~ 2012.   She is compliant with it. She drinks coffee within 15 minutes of taking . She is not on MVI.    Left hip replacement 04/2019  Pending right hip surgery.   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 Arthritis    "shoulders; wrist; probably spine" (06/24/2013)  . Chronic low back pain    followed by Dr Hardin Negus pain mgt  . Colon polyp   . Constipation   . Esophageal stricture   . Finger pain, left    2 fingers on left hand since wrist surgery  . GERD (gastroesophageal reflux disease)   . Heart murmur    "slight; not on RX" (06/24/2013)  . History of cardiac arrhythmia    cardiologist- Traci Turner  . Hx of colonic polyps 08/28/2004  . Hyperlipemia   . Hypertension   . Hypothyroidism   . Osteoarthritis of left knee    advanced  . PONV (postoperative nausea and vomiting)    Pt reports symptoms are the result of gallbladder and cholecystectomy, not anesthesia  . PVC's (premature ventricular contractions)   . Sleep apnea 1990's   "tested; tried mask; couldn't stand it; told me as long as I slept on my side I'd be  ok" (06/24/2013)  . Thyroid nodule   . Tobacco abuse    Past Surgical History:  Past Surgical History:  Procedure Laterality Date  . ABDOMINAL HYSTERECTOMY  1988   "partial" (06/24/2013)  . ABDOMINAL HYSTERECTOMY     partial in 1988  . ANKLE SURGERY Left 1995   "tendon repair" (06/24/2013)  . BACK SURGERY     "think today was my 8th back OR" (06/24/2013)  . CERVICAL SPINE SURGERY  2012  . [facility]  . CHOLECYSTECTOMY N/A 04/16/2013   Procedure: LAPAROSCOPIC CHOLECYSTECTOMY ;  Surgeon: Imogene Burn. Georgette Dover, MD;  Location[facility];  Service: General;  Laterality: N/A;  . ELBOW SURGERY  1990's  . FOOT SURGERY Right 2012   SPUR REMOVED  . INFUSION PUMP IMPLANTATION  1990's   "implantablet morphine pump; took it out w/in 11 months  . KNEE ARTHROSCOPY Left 1991; ~ 1993  . LAPAROSCOPIC LYSIS OF ADHESIONS N/A 04/16/2013   Procedure: LAPAROSCOPIC LYSIS OF ADHESIONS;  Surgeon: Imogene Burn. Georgette Dover, MD;  [facility];  Service: General;  Laterality: N/A;  . LUMBAR FUSION     and rods  . LUMBAR LAMINECTOMY/DECOMPRESSION MICRODISCECTOMY N/A 11/28/2012   Procedure: DECOMPRESSIVE LUMBAR LAMINECTOMY LEVEL 1;  Surgeon: Elaina Hoops, MD;  Location: [facility];  Service: Neurosurgery;  Laterality: N/A;  DECOMPRESSIVE LUMBAR LAMINECTOMY LEVEL 1  . ORIF DISTAL RADIUS FRACTURE Left 12/30/2013   dr Caralyn Guile  . ORIF WRIST FRACTURE Left 12/30/2013   Procedure: OPEN REDUCTION INTERNAL FIXATION (ORIF) LEFT WRIST FRACTURE AND REPAIR AS INDICATED;  Surgeon: Linna Hoff, MD;  Location: [place];  Service: Orthopedics;  Laterality: Left;  . POSTERIOR FUSION LUMBAR SPINE  06/24/2013  . ROBOTIC ASSISTED SALPINGO OOPHERECTOMY Bilateral 08/20/2014   Procedure: ROBOTIC ASSISTED SALPINGO OOPHORECTOMY;  Surgeon: Daria Pastures, MD;  Location: [facility];  Service: Gynecology;  Laterality: Bilateral;  . SHOULDER ARTHROSCOPY Left 09/2011  . THYROIDECTOMY  2012  . TOTAL HIP ARTHROPLASTY Left 04/27/2019   Procedure:  TOTAL HIP ARTHROPLASTY ANTERIOR APPROACH;  Surgeon: Gaynelle Arabian, MD;  [facility];  Service: Orthopedics;  Laterality: Left;  120min  . TOTAL KNEE ARTHROPLASTY Right 09/09/2017   Procedure: RIGHT TOTAL KNEE ARTHROPLASTY;  Surgeon: Gaynelle Arabian, MD;  [facility];  Service: Orthopedics;  Laterality: Right;      Social History:  reports that she has been smoking cigarettes. She has a 50.00 pack-year smoking history. She has never used smokeless tobacco. She reports that she does not drink alcohol or use drugs.  Family History: family history includes Bone cancer in her sister; Breast cancer in her maternal aunt and sister; Cancer in her father; Coronary artery disease (age of onset: 39) in her mother; Diabetes in her mother and sister; Diabetes type II in her mother; Hypertension in her brother, mother, and sister; Pancreatic cancer in her father; Skin cancer in her mother; Thyroid cancer in her mother.   HOME MEDICATIONS: Allergies as of 08/26/2019      Reactions   Morphine And Related Swelling   5-alpha Reductase Inhibitors    Amitriptyline Swelling   Leg swelling   Gabapentin Itching   Triamterene Itching, Rash      Medication List       Accurate as of August 26, 2019 11:59 PM. If you have any questions, ask your nurse or doctor.        aspirin 325 MG EC tablet Take 1 tablet (325 mg total) by mouth 2 (two) times daily.   bisacodyl 5 MG EC tablet Commonly known as: DULCOLAX Take 5-10 mg by mouth daily as needed for mild constipation.   carisoprodol 350 MG tablet Commonly known as: SOMA Take 175 mg by mouth 3 (three) times daily.   dorzolamide-timolol 22.3-6.8 MG/ML ophthalmic solution Commonly known as: COSOPT Place 1 drop into the left eye 2 (two) times daily.   estradiol 0.5 MG tablet Commonly known as: ESTRACE Take 0.5 mg by mouth at bedtime.   fenofibrate 160 MG tablet Take 1 tablet by mouth once daily   furosemide 40 MG tablet Commonly known as:  LASIX Take 1 tablet by mouth once daily   hydrochlorothiazide 25 MG tablet Commonly known as: HYDRODIURIL TAKE 1 TABLET BY MOUTH  DAILY   levothyroxine 88 MCG tablet Commonly known as: SYNTHROID TAKE 1 TABLET BY MOUTH  DAILY BEFORE BREAKFAST What changed: See the new instructions.   oxyCODONE-acetaminophen 10-325 MG tablet Commonly known as: PERCOCET Take 1 tablet by mouth every 4 (four) hours as needed for pain.   potassium chloride SA 20 MEQ tablet Commonly known as: KLOR-CON Take 3 tablets (60 mEq total) by mouth daily.   simvastatin 20 MG tablet Commonly known as: ZOCOR TAKE 1 TABLET BY MOUTH AT  BEDTIME   traZODone 50 MG tablet Commonly known  as: DESYREL TAKE 1 TABLET BY MOUTH AT BEDTIME         REVIEW OF SYSTEMS: A comprehensive ROS was conducted with the patient and is negative except as per HPI     OBJECTIVE:  VS: BP 128/68 (BP Location: Right Arm, Patient Position: Sitting, Cuff Size: Normal)   Pulse 76   Temp 97.7 F (36.5 C)   Ht 5' (1.524 m)   Wt 171 lb 6.4 oz (77.7 kg)   SpO2 98%   BMI 33.47 kg/m    Wt Readings from Last 3 Encounters:  08/26/19 171 lb 6.4 oz (77.7 kg)  08/13/19 171 lb 9.6 oz (77.8 kg)  05/28/19 162 lb (73.5 kg)     EXAM: General: Pt appears well and is in NAD  Neck: General: Supple without adenopathy. Thyroid:  No goiter or nodules appreciated. No thyroid bruit.  Lungs: Clear with good BS bilat with no rales, rhonchi, or wheezes  Heart: Auscultation: RRR.  Abdomen: Normoactive bowel sounds, soft, nontender, without masses or organomegaly palpable  Extremities:  BL LE: Trace pretibial edema  Neuro: Cranial nerves: II - XII grossly intact  Motor: Normal strength throughout  Mental Status: Judgment, insight: Intact Orientation: Oriented to time, place, and person Mood and affect: No depression, anxiety, or agitation     DATA REVIEWED: Results for SAVANAH, LECHTENBERG (MRN [id number]) as of 08/27/2019 10:48  Ref. Range  08/13/2019 14:16 08/26/2019 11:22  TSH Latest Ref Range: 0.35 - 4.50 uIU/mL 4.66 (H) 8.05 (H)  T4,Free(Direct) Latest Ref Range: 0.60 - 1.60 ng/dL  1.49  Thyroxine (T4) Latest Ref Range: 5.1 - 11.9 mcg/dL 13.2 (H)       ASSESSMENT/PLAN/RECOMMENDATIONS:   1. Post-Surgical Hypothyroidism :   -She is clinically euthyroid -No local neck symptoms - Pt educated extensively on the correct way to take levothyroxine (first thing in the morning with water, 30 minutes before eating or taking other medications). - Pt encouraged to double dose the following day if she were to miss a dose given long half-life of levothyroxine. -It is interesting to me that her TSH is elevated despite a free T4 level on the upper cutoff of normal.  This is usually due to imperfect adherence, but the patient assures me of compliance.  Another explanation for this is that there is variable absorption, I have advised her to hold off on drinking the coffee at least 30 minutes after levothyroxine use.  Medications : Increase levothyroxine 88 MCG to 2 tablet on Sundays, continue 1 tablet Monday through Saturday.   Follow-up in 4 months Labs in 8 weeks Signed electronically by: Mack Guise, MD  [facility]  [address] [place], [place] [address] Phone: [phone] FAX: [phone]   CC: [name], DO [address] [place] [address] Phone: [phone] Fax: [phone]   Return to Endocrinology clinic as below: Future Appointments  Date Time Provider [place]  09/02/2019  1:00 PM [facility] PAT 2 [facility] None  09/03/2019  1:00 PM [facility] PAT 6 [facility] None  10/21/2019  1:45 PM [facility] LAB [facility] None  10/23/2019  1:00 PM [facility] MM 3 GI-BCGMM GI-BRE[facility] CE  10/29/2019  1:20 PM O'Neal, Cassie Freer, MD [facility]  12/28/2019  1:40 PM Letcher Schweikert, Melanie Crazier, MD [facility]O None

## 2019-08-27 ENCOUNTER — Ambulatory Visit: Payer: Medicare Other | Admitting: Internal Medicine

## 2019-08-27 ENCOUNTER — Encounter: Payer: Self-pay | Admitting: Internal Medicine

## 2019-08-27 MED ORDER — LEVOTHYROXINE SODIUM 88 MCG PO TABS: 88.0000 ug | ORAL_TABLET | ORAL | 1 refills | Status: DC

## 2019-08-28 NOTE — Patient Instructions (Addendum)
DUE TO COVID-19 ONLY ONE VISITOR IS ALLOWED TO COME WITH YOU AND STAY IN THE WAITING ROOM ONLY DURING PRE OP AND PROCEDURE DAY OF SURGERY. THE 1 VISITOR MAY VISIT WITH YOU AFTER SURGERY IN YOUR PRIVATE ROOM DURING VISITING HOURS ONLY!  YOU NEED TO HAVE A COVID 19 TEST ON 09-05-19 @ 12:30 PM, THIS TEST MUST BE DONE BEFORE SURGERY, COME  [address], [place] , [address].  ([place]) ONCE YOUR COVID TEST IS COMPLETED, PLEASE BEGIN THE QUARANTINE INSTRUCTIONS AS OUTLINED IN YOUR HANDOUT.                LATORIE VECELLIO  08/28/2019   Your procedure is scheduled on: 09-09-19   Report to [facility] Main  Entrance    Report to Admitting at 6:00 AM     Call this number if you have problems the morning of surgery [phone]    Remember: NO SOLID FOOD AFTER MIDNIGHT THE NIGHT PRIOR TO SURGERY. NOTHING BY MOUTH EXCEPT CLEAR LIQUIDS UNTIL 5:30 AM . PLEASE FINISH ENSURE DRINK PER SURGEON ORDER  WHICH NEEDS TO BE COMPLETED AT 5:30 AM .   CLEAR LIQUID DIET   Foods Allowed                                                                     Foods Excluded  Coffee and tea, regular and decaf                             liquids that you cannot  Plain Jell-O any favor except red or purple                                           see through such as: Fruit ices (not with fruit pulp)                                     milk, soups, orange juice  Iced Popsicles                                    All solid food Carbonated beverages, regular and diet                                    Cranberry, grape and apple juices Sports drinks like Gatorade Lightly seasoned clear broth or consume(fat free) Sugar, honey syrup  Sample Menu Breakfast                                Lunch                                     Supper Cranberry juice  Beef broth                            Chicken broth Jell-O                                     Grape juice                            Apple juice Coffee or tea                        Jell-O                                      Popsicle                                                Coffee or tea                        Coffee or tea  _____________________________________________________________________     Take these medicines the morning of surgery with A SIP OF WATER: Levothryoxine (Synthroid), and Percocet, prn, and Eyedrops  BRUSH YOUR TEETH MORNING OF SURGERY AND RINSE YOUR MOUTH OUT, NO CHEWING GUM CANDY OR MINTS.                                You may not have any metal on your body including hair pins and              piercings     Do not wear jewelry, make-up, lotions, powders or perfumes, deodorant              Do not wear nail polish on your fingernails.  Do not shave  48 hours prior to surgery.               Do not bring valuables to the hospital. [place].  Contacts, dentures or bridgework may not be worn into surgery.  You may bring an overnight bag       Special Instructions: N/A              Please read over the following fact sheets you were given: _____________________________________________________________________             [facility] - Preparing for Surgery Before surgery, you can play an important role.  Because skin is not sterile, your skin needs to be as free of germs as possible.  You can reduce the number of germs on your skin by washing with CHG (chlorahexidine gluconate) soap before surgery.  CHG is an antiseptic cleaner which kills germs and bonds with the skin to continue killing germs even after washing. Please DO NOT use if you have an allergy to CHG or antibacterial soaps.  If your skin becomes reddened/irritated stop using the CHG and inform your nurse when you arrive at Short Stay. Do not shave (  including legs and underarms) for at least 48 hours prior to the first CHG shower.  You may shave your  face/neck. Please follow these instructions carefully:  1.  Shower with CHG Soap the night before surgery and the  morning of Surgery.  2.  If you choose to wash your hair, wash your hair first as usual with your  normal  shampoo.  3.  After you shampoo, rinse your hair and body thoroughly to remove the  shampoo.                           4.  Use CHG as you would any other liquid soap.  You can apply chg directly  to the skin and wash                       Gently with a scrungie or clean washcloth.  5.  Apply the CHG Soap to your body ONLY FROM THE NECK DOWN.   Do not use on face/ open                           Wound or open sores. Avoid contact with eyes, ears mouth and genitals (private parts).                       Wash face,  Genitals (private parts) with your normal soap.             6.  Wash thoroughly, paying special attention to the area where your surgery  will be performed.  7.  Thoroughly rinse your body with warm water from the neck down.  8.  DO NOT shower/wash with your normal soap after using and rinsing off  the CHG Soap.                9.  Pat yourself dry with a clean towel.            10.  Wear clean pajamas.            11.  Place clean sheets on your bed the night of your first shower and do not  sleep with pets. Day of Surgery : Do not apply any lotions/deodorants the morning of surgery.  Please wear clean clothes to the hospital/surgery center.  FAILURE TO FOLLOW THESE INSTRUCTIONS MAY RESULT IN THE CANCELLATION OF YOUR SURGERY PATIENT SIGNATURE_________________________________  NURSE SIGNATURE__________________________________  ________________________________________________________________________   Adam Phenix  An incentive spirometer is a tool that can help keep your lungs clear and active. This tool measures how well you are filling your lungs with each breath. Taking long deep breaths may help reverse or decrease the chance of developing breathing  (pulmonary) problems (especially infection) following:  A long period of time when you are unable to move or be active. BEFORE THE PROCEDURE   If the spirometer includes an indicator to show your best effort, your nurse or respiratory therapist will set it to a desired goal.  If possible, sit up straight or lean slightly forward. Try not to slouch.  Hold the incentive spirometer in an upright position. INSTRUCTIONS FOR USE  1. Sit on the edge of your bed if possible, or sit up as far as you can in bed or on a chair. 2. Hold the incentive spirometer in an upright position. 3. Breathe out normally. 4. Place the mouthpiece in your mouth  and seal your lips tightly around it. 5. Breathe in slowly and as deeply as possible, raising the piston or the ball toward the top of the column. 6. Hold your breath for 3-5 seconds or for as long as possible. Allow the piston or ball to fall to the bottom of the column. 7. Remove the mouthpiece from your mouth and breathe out normally. 8. Rest for a few seconds and repeat Steps 1 through 7 at least 10 times every 1-2 hours when you are awake. Take your time and take a few normal breaths between deep breaths. 9. The spirometer may include an indicator to show your best effort. Use the indicator as a goal to work toward during each repetition. 10. After each set of 10 deep breaths, practice coughing to be sure your lungs are clear. If you have an incision (the cut made at the time of surgery), support your incision when coughing by placing a pillow or rolled up towels firmly against it. Once you are able to get out of bed, walk around indoors and cough well. You may stop using the incentive spirometer when instructed by your caregiver.  RISKS AND COMPLICATIONS  Take your time so you do not get dizzy or light-headed.  If you are in pain, you may need to take or ask for pain medication before doing incentive spirometry. It is harder to take a deep breath if you  are having pain. AFTER USE  Rest and breathe slowly and easily.  It can be helpful to keep track of a log of your progress. Your caregiver can provide you with a simple table to help with this. If you are using the spirometer at home, follow these instructions: [place] IF:   You are having difficultly using the spirometer.  You have trouble using the spirometer as often as instructed.  Your pain medication is not giving enough relief while using the spirometer.  You develop fever of 100.5 F (38.1 C) or higher. SEEK IMMEDIATE MEDICAL CARE IF:   You cough up bloody sputum that had not been present before.  You develop fever of 102 F (38.9 C) or greater.  You develop worsening pain at or near the incision site. MAKE SURE YOU:   Understand these instructions.  Will watch your condition.  Will get help right away if you are not doing well or get worse. Document Released: 11/05/2006 Document Revised: 09/17/2011 Document Reviewed: 01/06/2007 ExitCare Patient Information 2014 ExitCare, [place].   ________________________________________________________________________  WHAT IS A BLOOD TRANSFUSION? Blood Transfusion Information  A transfusion is the replacement of blood or some of its parts. Blood is made up of multiple cells which provide different functions.  Red blood cells carry oxygen and are used for blood loss replacement.  White blood cells fight against infection.  Platelets control bleeding.  Plasma helps clot blood.  Other blood products are available for specialized needs, such as hemophilia or other clotting disorders. BEFORE THE TRANSFUSION  Who gives blood for transfusions?   Healthy volunteers who are fully evaluated to make sure their blood is safe. This is blood bank blood. Transfusion therapy is the safest it has ever been in the practice of medicine. Before blood is taken from a donor, a complete history is taken to make sure that person has  no history of diseases nor engages in risky social behavior (examples are intravenous drug use or sexual activity with multiple partners). The donor's travel history is screened to minimize risk of transmitting  infections, such as malaria. The donated blood is tested for signs of infectious diseases, such as HIV and hepatitis. The blood is then tested to be sure it is compatible with you in order to minimize the chance of a transfusion reaction. If you or a relative donates blood, this is often done in anticipation of surgery and is not appropriate for emergency situations. It takes many days to process the donated blood. RISKS AND COMPLICATIONS Although transfusion therapy is very safe and saves many lives, the main dangers of transfusion include:   Getting an infectious disease.  Developing a transfusion reaction. This is an allergic reaction to something in the blood you were given. Every precaution is taken to prevent this. The decision to have a blood transfusion has been considered carefully by your caregiver before blood is given. Blood is not given unless the benefits outweigh the risks. AFTER THE TRANSFUSION  Right after receiving a blood transfusion, you will usually feel much better and more energetic. This is especially true if your red blood cells have gotten low (anemic). The transfusion raises the level of the red blood cells which carry oxygen, and this usually causes an energy increase.  The nurse administering the transfusion will monitor you carefully for complications. HOME CARE INSTRUCTIONS  No special instructions are needed after a transfusion. You may find your energy is better. Speak with your caregiver about any limitations on activity for underlying diseases you may have. SEEK MEDICAL CARE IF:   Your condition is not improving after your transfusion.  You develop redness or irritation at the intravenous (IV) site. SEEK IMMEDIATE MEDICAL CARE IF:  Any of the following  symptoms occur over the next 12 hours:  Shaking chills.  You have a temperature by mouth above 102 F (38.9 C), not controlled by medicine.  Chest, back, or muscle pain.  People around you feel you are not acting correctly or are confused.  Shortness of breath or difficulty breathing.  Dizziness and fainting.  You get a rash or develop hives.  You have a decrease in urine output.  Your urine turns a dark color or changes to pink, red, or brown. Any of the following symptoms occur over the next 10 days:  You have a temperature by mouth above 102 F (38.9 C), not controlled by medicine.  Shortness of breath.  Weakness after normal activity.  The white part of the eye turns yellow (jaundice).  You have a decrease in the amount of urine or are urinating less often.  Your urine turns a dark color or changes to pink, red, or brown. Document Released: 06/22/2000 Document Revised: 09/17/2011 Document Reviewed: 02/09/2008 [facility] Patient Information 2014 [place], [place].  _______________________________________________________________________

## 2019-09-02 ENCOUNTER — Other Ambulatory Visit: Payer: Self-pay

## 2019-09-02 ENCOUNTER — Encounter (HOSPITAL_COMMUNITY)
Admission: RE | Admit: 2019-09-02 | Discharge: 2019-09-02 | Disposition: A | Discharge status: Home / Self Care | Payer: Medicare Other | Source: Ambulatory Visit | Attending: Orthopedic Surgery | Admitting: Orthopedic Surgery

## 2019-09-02 ENCOUNTER — Encounter (HOSPITAL_COMMUNITY): Payer: Self-pay

## 2019-09-02 DIAGNOSIS — M1611 Unilateral primary osteoarthritis, right hip: Secondary | ICD-10-CM | POA: Insufficient documentation

## 2019-09-02 DIAGNOSIS — Z79899 Other long term (current) drug therapy: Secondary | ICD-10-CM | POA: Insufficient documentation

## 2019-09-02 DIAGNOSIS — E039 Hypothyroidism, unspecified: Secondary | ICD-10-CM | POA: Insufficient documentation

## 2019-09-02 DIAGNOSIS — F17210 Nicotine dependence, cigarettes, uncomplicated: Secondary | ICD-10-CM | POA: Insufficient documentation

## 2019-09-02 DIAGNOSIS — Z7982 Long term (current) use of aspirin: Secondary | ICD-10-CM | POA: Insufficient documentation

## 2019-09-02 DIAGNOSIS — I10 Essential (primary) hypertension: Secondary | ICD-10-CM | POA: Insufficient documentation

## 2019-09-02 DIAGNOSIS — Z01812 Encounter for preprocedural laboratory examination: Secondary | ICD-10-CM | POA: Insufficient documentation

## 2019-09-02 DIAGNOSIS — Z01818 Encounter for other preprocedural examination: Secondary | ICD-10-CM | POA: Insufficient documentation

## 2019-09-03 ENCOUNTER — Encounter (HOSPITAL_COMMUNITY)
Admission: RE | Admit: 2019-09-03 | Discharge: 2019-09-03 | Disposition: A | Discharge status: Home / Self Care | Payer: Medicare Other | Source: Ambulatory Visit | Attending: Orthopedic Surgery | Admitting: Orthopedic Surgery

## 2019-09-03 DIAGNOSIS — F17210 Nicotine dependence, cigarettes, uncomplicated: Secondary | ICD-10-CM | POA: Diagnosis not present

## 2019-09-03 DIAGNOSIS — E039 Hypothyroidism, unspecified: Secondary | ICD-10-CM | POA: Diagnosis not present

## 2019-09-03 DIAGNOSIS — M1611 Unilateral primary osteoarthritis, right hip: Secondary | ICD-10-CM | POA: Diagnosis not present

## 2019-09-03 DIAGNOSIS — Z01818 Encounter for other preprocedural examination: Secondary | ICD-10-CM | POA: Diagnosis not present

## 2019-09-03 DIAGNOSIS — Z7982 Long term (current) use of aspirin: Secondary | ICD-10-CM | POA: Diagnosis not present

## 2019-09-03 DIAGNOSIS — I10 Essential (primary) hypertension: Secondary | ICD-10-CM | POA: Diagnosis not present

## 2019-09-03 DIAGNOSIS — Z01812 Encounter for preprocedural laboratory examination: Secondary | ICD-10-CM | POA: Diagnosis not present

## 2019-09-03 DIAGNOSIS — Z79899 Other long term (current) drug therapy: Secondary | ICD-10-CM | POA: Diagnosis not present

## 2019-09-03 LAB — COMPREHENSIVE METABOLIC PANEL
ALT: 19 U/L (ref 0–44)
AST: 23 U/L (ref 15–41)
Albumin: 4.3 g/dL (ref 3.5–5.0)
Alkaline Phosphatase: 51 U/L (ref 38–126)
Anion gap: 9 (ref 5–15)
BUN: 22 mg/dL (ref 8–23)
CO2: 32 mmol/L (ref 22–32)
Calcium: 9 mg/dL (ref 8.9–10.3)
Chloride: 99 mmol/L (ref 98–111)
Creatinine, Ser: 1.18 mg/dL — ABNORMAL HIGH (ref 0.44–1.00)
GFR calc Af Amer: 53 mL/min — ABNORMAL LOW (ref >60)
GFR calc non Af Amer: 45 mL/min — ABNORMAL LOW (ref >60)
Glucose, Bld: 113 mg/dL — ABNORMAL HIGH (ref 70–99)
Potassium: 3.5 mmol/L (ref 3.5–5.1)
Sodium: 140 mmol/L (ref 135–145)
Total Bilirubin: 0.4 mg/dL (ref 0.3–1.2)
Total Protein: 7.4 g/dL (ref 6.5–8.1)

## 2019-09-03 LAB — APTT: aPTT: 30 seconds (ref 24–36)

## 2019-09-03 LAB — CBC
HCT: 40.3 % (ref 36.0–46.0)
Hemoglobin: 13.3 g/dL (ref 12.0–15.0)
MCH: 29.8 pg (ref 26.0–34.0)
MCHC: 33 g/dL (ref 30.0–36.0)
MCV: 90.4 fL (ref 80.0–100.0)
Platelets: 267 10*3/uL (ref 150–400)
RBC: 4.46 MIL/uL (ref 3.87–5.11)
RDW: 13.8 % (ref 11.5–15.5)
WBC: 4.4 10*3/uL (ref 4.0–10.5)
nRBC: 0 % (ref 0.0–0.2)

## 2019-09-03 LAB — SURGICAL PCR SCREEN
MRSA, PCR: NEGATIVE
Staphylococcus aureus: NEGATIVE

## 2019-09-03 LAB — PROTIME-INR
INR: 1 (ref 0.8–1.2)
Prothrombin Time: 12.9 seconds (ref 11.4–15.2)

## 2019-09-04 NOTE — Progress Notes (Signed)
Anesthesia Chart Review   Case: [id number] Date/Time: 09/09/19 0815   Procedure: TOTAL HIP ARTHROPLASTY ANTERIOR APPROACH (Right Hip) - 170min   Anesthesia type: Choice   Pre-op diagnosis: Right hip osteoarthritis   Location: [facility]OM 10 / [facility]   Surgeons: Gaynelle Arabian, MD      DISCUSSION:73 y.o. current every day smoker with h/o PONV, hypothyroidism, GERD, PVCs, right hip OA scheduled for above procedure 09/09/19 with Dr. Gaynelle Arabian.   Cleared by cardiology 08/14/2019 08/14/2019.  Per Rosaria Ferries, PA-C, "Chart reviewed as part of pre-operative protocol coverage. Patient was contacted 08/14/2019 in reference to pre-operative risk assessment for pending surgery as outlined below.  Joan [name] was last seen on 03/12/2019 by Dr Audie Box.  Since that day, Joan [name] has done well from a cardiac standpoint. Significant limitations related to hip pain, no chest pain or SOB. DASI score 3.97. Therefore, based on ACC/AHA guidelines, the patient would be at acceptable risk for the planned procedure without further cardiovascular testing."  Anticipate pt can proceed with planned procedure barring acute status change.   VS: BP 117/72   Pulse 75   Temp 36.7 C (Oral)   Resp 18   Ht 5' (1.524 m)   Wt 77.9 kg   SpO2 98%   BMI 33.55 kg/m   PROVIDERS: [name], DO is PCP   Fransico Him, MD is Cardiologist   [facility], Mammie Lorenzo, MD is Endocrinologist last seen 08/26/2019, stable at this visit LABS: Labs reviewed: Acceptable for surgery. (all labs ordered are listed, but only abnormal results are displayed)  Labs Reviewed  COMPREHENSIVE METABOLIC PANEL - Abnormal; Notable for the following components:      Result Value   Glucose, Bld 113 (*)    Creatinine, Ser 1.18 (*)    GFR calc non Af Amer 45 (*)    GFR calc Af Amer 53 (*)    All other components within normal limits  SURGICAL PCR SCREEN  APTT  CBC  PROTIME-INR  TYPE AND SCREEN      IMAGES:   EKG: 03/12/2019 Rate 64 bpm  Normal sinus rhythm  Nonspecific ST and T wave abnormality   CV: Echo 02/17/2019 IMPRESSIONS    1. The left ventricle has mildly reduced systolic function, with an  ejection fraction of 45-50%. The cavity size was normal. Left ventricular  diastolic Doppler parameters are consistent with impaired relaxation. Left  ventricular diffuse hypokinesis.  Cannot rule out apical wall motion abnormality or thrombus as apex is not  adequately visualized.  2. The right ventricle has normal systolic function. The cavity was  normal. There is no increase in right ventricular wall thickness. Right  ventricular systolic pressure could not be assessed.  3. The aortic valve was not well visualized. Mild sclerosis of the aortic  valve. Aortic valve regurgitation was not assessed by color flow Doppler.  4. The aorta is normal in size and structure.  5. Recommend limited study with definity contrast to assess wall motion.  Myocardial Perfusion 01/24/2017  Nuclear stress EF: 64%.  The left ventricular ejection fraction is normal (55-65%).  There was no ST segment deviation noted during stress.  No T wave inversion was noted during stress.  This is a low risk study. Past Medical History:  Diagnosis Date  . Arthritis    "shoulders; wrist; probably spine" (06/24/2013)  . Chronic low back pain    followed by Dr Hardin Negus pain mgt  . Colon polyp   . Constipation   .  Esophageal stricture   . Finger pain, left    2 fingers on left hand since wrist surgery  . GERD (gastroesophageal reflux disease)   . Heart murmur    "slight; not on RX" (06/24/2013)  . History of cardiac arrhythmia    cardiologist- Traci Turner  . Hx of colonic polyps 08/28/2004  . Hyperlipemia   . Hypertension   . Hypothyroidism   . Osteoarthritis of left knee    advanced  . PONV (postoperative nausea and vomiting)    Pt reports symptoms are the result of gallbladder and  cholecystectomy, not anesthesia  . PVC's (premature ventricular contractions)   . Sleep apnea 1990's   "tested; tried mask; couldn't stand it; told me as long as I slept on my side I'd be ok" (06/24/2013)  . Thyroid nodule   . Tobacco abuse     Past Surgical History:  Procedure Laterality Date  . ABDOMINAL HYSTERECTOMY  1988   "partial" (06/24/2013)  . ABDOMINAL HYSTERECTOMY     partial in 1988  . ANKLE SURGERY Left 1995   "tendon repair" (06/24/2013)  . BACK SURGERY     "think today was my 8th back OR" (06/24/2013)  . CERVICAL SPINE SURGERY  2012  . [place]  . CHOLECYSTECTOMY N/A 04/16/2013   Procedure: LAPAROSCOPIC CHOLECYSTECTOMY ;  Surgeon: Imogene Burn. Georgette Dover, MD;  [facility];  Service: General;  Laterality: N/A;  . ELBOW SURGERY  1990's  . FOOT SURGERY Right 2012   SPUR REMOVED  . INFUSION PUMP IMPLANTATION  1990's   "implantablet morphine pump; took it out w/in 11 months  . KNEE ARTHROSCOPY Left 1991; ~ 1993  . LAPAROSCOPIC LYSIS OF ADHESIONS N/A 04/16/2013   Procedure: LAPAROSCOPIC LYSIS OF ADHESIONS;  Surgeon: Imogene Burn. Georgette Dover, MD;  [facility];  Service: General;  Laterality: N/A;  . LUMBAR FUSION     and rods  . LUMBAR LAMINECTOMY/DECOMPRESSION MICRODISCECTOMY N/A 11/28/2012   Procedure: DECOMPRESSIVE LUMBAR LAMINECTOMY LEVEL 1;  Surgeon: Elaina Hoops, MD;  Location: [place] [facility];  Service: Neurosurgery;  Laterality: N/A;  DECOMPRESSIVE LUMBAR LAMINECTOMY LEVEL 1  . ORIF DISTAL RADIUS FRACTURE Left 12/30/2013   dr Caralyn Guile  . ORIF WRIST FRACTURE Left 12/30/2013   Procedure: OPEN REDUCTION INTERNAL FIXATION (ORIF) LEFT WRIST FRACTURE AND REPAIR AS INDICATED;  Surgeon: Linna Hoff, MD;  Location: [place];  Service: Orthopedics;  Laterality: Left;  . POSTERIOR FUSION LUMBAR SPINE  06/24/2013  . ROBOTIC ASSISTED SALPINGO OOPHERECTOMY Bilateral 08/20/2014   Procedure: ROBOTIC ASSISTED SALPINGO OOPHORECTOMY;  Surgeon: Daria Pastures, MD;  Location: [place]  [facility];  Service: Gynecology;  Laterality: Bilateral;  . SHOULDER ARTHROSCOPY Left 09/2011  . THYROIDECTOMY  2012  . TOTAL HIP ARTHROPLASTY Left 04/27/2019   Procedure: TOTAL HIP ARTHROPLASTY ANTERIOR APPROACH;  Surgeon: Gaynelle Arabian, MD;  [facility];  Service: Orthopedics;  Laterality: Left;  159min  . TOTAL KNEE ARTHROPLASTY Right 09/09/2017   Procedure: RIGHT TOTAL KNEE ARTHROPLASTY;  Surgeon: Gaynelle Arabian, MD;  [facility];  Service: Orthopedics;  Laterality: Right;    MEDICATIONS: . aspirin EC 325 MG EC tablet  . bisacodyl (DULCOLAX) 5 MG EC tablet  . carisoprodol (SOMA) 350 MG tablet  . dorzolamide-timolol (COSOPT) 22.3-6.8 MG/ML ophthalmic solution  . estradiol (ESTRACE) 0.5 MG tablet  . fenofibrate 160 MG tablet  . furosemide (LASIX) 40 MG tablet  . hydrochlorothiazide (HYDRODIURIL) 25 MG tablet  . levothyroxine (SYNTHROID) 88 MCG tablet  . oxyCODONE-acetaminophen (PERCOCET) 10-325  MG tablet  . potassium chloride SA (KLOR-CON) 20 MEQ tablet  . simvastatin (ZOCOR) 20 MG tablet  . traZODone (DESYREL) 50 MG tablet   No current facility-administered medications for this encounter.     Konrad Felix, PA-C [facility] [phone] 1:48 PM

## 2019-09-05 ENCOUNTER — Other Ambulatory Visit (HOSPITAL_COMMUNITY)
Admission: RE | Admit: 2019-09-05 | Discharge: 2019-09-05 | Disposition: A | Discharge status: Home / Self Care | Payer: Medicare Other | Source: Ambulatory Visit | Attending: Orthopedic Surgery | Admitting: Orthopedic Surgery

## 2019-09-05 DIAGNOSIS — Z20822 Contact with and (suspected) exposure to covid-19: Secondary | ICD-10-CM | POA: Insufficient documentation

## 2019-09-05 DIAGNOSIS — Z01812 Encounter for preprocedural laboratory examination: Secondary | ICD-10-CM | POA: Insufficient documentation

## 2019-09-05 LAB — SARS CORONAVIRUS 2 (TAT 6-24 HRS): SARS Coronavirus 2: NEGATIVE

## 2019-09-07 NOTE — H&P (Signed)
TOTAL HIP ADMISSION H&P  Patient is admitted for right total hip arthroplasty.  Subjective:  Chief Complaint: Right hip pain  HPI: Joan [name], 74 y.o. female, has a history of pain and functional disability in the right hip due to arthritis and patient has failed non-surgical conservative treatments for greater than 12 weeks to include use of assistive devices and activity modification. Onset of symptoms was gradual, starting several years ago with gradually worsening course since that time. The patient noted no past surgery on the right hip. Patient currently rates pain in the right hip at 7 out of 10 with activity. Patient has worsening of pain with activity and weight bearing, pain that interfers with activities of daily living and instability. Patient has evidence of bone on bone arthritis with subchondral cystic formation by imaging studies. This condition presents safety issues increasing the risk of falls. There is no current active infection.  Patient Active Problem List   Diagnosis Date Noted  . Wound infection 05/28/2019  . Hypokalemia 05/28/2019  . OA (osteoarthritis) of hip 04/27/2019  . Insomnia 12/13/2016  . Preventative health care 09/08/2014  . Ovarian cyst 08/13/2014  . Preoperative cardiovascular examination 08/13/2014  . Abnormal EKG 08/13/2014  . PVC's (premature ventricular contractions)   . Fracture of left distal radius 12/30/2013  . Spinal stenosis of lumbar region 06/24/2013  . Atherosclerosis of aorta ([place]) 05/06/2013  . Osteopenia 03/06/2013  . Edema 11/05/2012  . Myalgia 04/11/2011  . Night sweats 12/27/2010  . Contact lens/glasses fitting 12/27/2010  . Menopause 12/27/2010  . Family history of breast cancer 12/27/2010  . ADJUSTMENT DISORDER WITH ANXIOUS MOOD 05/26/2010  . PULMONARY NODULE, LEFT UPPER LOBE 12/15/2009  . CONSTIPATION, DRUG INDUCED 08/19/2009  . KNEE PAIN, LEFT 09/13/2008  . THYROID NODULE, LEFT 05/26/2008  . GERD 05/26/2008  .  INSOMNIA 05/06/2008  . Anemia 10/24/2007  . Hypothyroidism 08/12/2007  . Hyperlipidemia 08/12/2007  . TOBACCO ABUSE 08/12/2007  . Essential hypertension 08/12/2007  . OA (osteoarthritis) of knee 08/12/2007  . Hx of colonic polyps 08/28/2004    Past Medical History:  Diagnosis Date  . Arthritis    "shoulders; wrist; probably spine" (06/24/2013)  . Chronic low back pain    followed by Dr Hardin Negus pain mgt  . Colon polyp   . Constipation   . Esophageal stricture   . Finger pain, left    2 fingers on left hand since wrist surgery  . GERD (gastroesophageal reflux disease)   . Heart murmur    "slight; not on RX" (06/24/2013)  . History of cardiac arrhythmia    cardiologist- Traci Turner  . Hx of colonic polyps 08/28/2004  . Hyperlipemia   . Hypertension   . Hypothyroidism   . Osteoarthritis of left knee    advanced  . PONV (postoperative nausea and vomiting)    Pt reports symptoms are the result of gallbladder and cholecystectomy, not anesthesia  . PVC's (premature ventricular contractions)   . Sleep apnea 1990's   "tested; tried mask; couldn't stand it; told me as long as I slept on my side I'd be ok" (06/24/2013)  . Thyroid nodule   . Tobacco abuse     Past Surgical History:  Procedure Laterality Date  . ABDOMINAL HYSTERECTOMY  1988   "partial" (06/24/2013)  . ABDOMINAL HYSTERECTOMY     partial in 1988  . ANKLE SURGERY Left 1995   "tendon repair" (06/24/2013)  . BACK SURGERY     "think today was my 8th back  OR" (06/24/2013)  . CERVICAL SPINE SURGERY  2012  . [place]  . CHOLECYSTECTOMY N/A 04/16/2013   Procedure: LAPAROSCOPIC CHOLECYSTECTOMY ;  Surgeon: Imogene Burn. Georgette Dover, MD;  [facility];  Service: General;  Laterality: N/A;  . ELBOW SURGERY  1990's  . FOOT SURGERY Right 2012   SPUR REMOVED  . INFUSION PUMP IMPLANTATION  1990's   "implantablet morphine pump; took it out w/in 11 months  . KNEE ARTHROSCOPY Left 1991; ~ 1993  . LAPAROSCOPIC LYSIS  OF ADHESIONS N/A 04/16/2013   Procedure: LAPAROSCOPIC LYSIS OF ADHESIONS;  Surgeon: Imogene Burn. Georgette Dover, MD;  [facility];  Service: General;  Laterality: N/A;  . LUMBAR FUSION     and rods  . LUMBAR LAMINECTOMY/DECOMPRESSION MICRODISCECTOMY N/A 11/28/2012   Procedure: DECOMPRESSIVE LUMBAR LAMINECTOMY LEVEL 1;  Surgeon: Elaina Hoops, MD;  Location: [place] [facility];  Service: Neurosurgery;  Laterality: N/A;  DECOMPRESSIVE LUMBAR LAMINECTOMY LEVEL 1  . ORIF DISTAL RADIUS FRACTURE Left 12/30/2013   dr Caralyn Guile  . ORIF WRIST FRACTURE Left 12/30/2013   Procedure: OPEN REDUCTION INTERNAL FIXATION (ORIF) LEFT WRIST FRACTURE AND REPAIR AS INDICATED;  Surgeon: Linna Hoff, MD;  Location: [place];  Service: Orthopedics;  Laterality: Left;  . POSTERIOR FUSION LUMBAR SPINE  06/24/2013  . ROBOTIC ASSISTED SALPINGO OOPHERECTOMY Bilateral 08/20/2014   Procedure: ROBOTIC ASSISTED SALPINGO OOPHORECTOMY;  Surgeon: Daria Pastures, MD;  Location: [place] [facility];  Service: Gynecology;  Laterality: Bilateral;  . SHOULDER ARTHROSCOPY Left 09/2011  . THYROIDECTOMY  2012  . TOTAL HIP ARTHROPLASTY Left 04/27/2019   Procedure: TOTAL HIP ARTHROPLASTY ANTERIOR APPROACH;  Surgeon: Gaynelle Arabian, MD;  [facility];  Service: Orthopedics;  Laterality: Left;  118min  . TOTAL KNEE ARTHROPLASTY Right 09/09/2017   Procedure: RIGHT TOTAL KNEE ARTHROPLASTY;  Surgeon: Gaynelle Arabian, MD;  [facility];  Service: Orthopedics;  Laterality: Right;    Prior to Admission medications   Medication Sig Start Date End Date Taking? Authorizing Provider  bisacodyl (DULCOLAX) 5 MG EC tablet Take 5-10 mg by mouth daily as needed for mild constipation.  03/06/13  Yes Yoo, Doe-Hyun R, DO  carisoprodol (SOMA) 350 MG tablet Take 175 mg by mouth 3 (three) times daily.    Yes [provider]  dorzolamide-timolol (COSOPT) 22.3-6.8 MG/ML ophthalmic solution Place 1 drop into the left eye 2 (two) times daily. 08/16/15  Yes [provider]   estradiol (ESTRACE) 0.5 MG tablet Take 0.5 mg by mouth at bedtime.  12/21/17  Yes [provider]  fenofibrate 160 MG tablet Take 1 tablet by mouth once daily Patient taking differently: Take 160 mg by mouth daily.  03/11/19  Yes Roma Schanz R, DO  furosemide (LASIX) 40 MG tablet Take 1 tablet by mouth once daily Patient taking differently: Take 40 mg by mouth daily.  08/10/19  Yes Roma Schanz R, DO  hydrochlorothiazide (HYDRODIURIL) 25 MG tablet TAKE 1 TABLET BY MOUTH  DAILY Patient taking differently: Take 25 mg by mouth daily.  08/03/19  Yes [name], DO  oxyCODONE-acetaminophen (PERCOCET) 10-325 MG tablet Take 1 tablet by mouth every 4 (four) hours as needed for pain.  05/20/19  Yes [provider]  potassium chloride SA (KLOR-CON) 20 MEQ tablet Take 3 tablets (60 mEq total) by mouth daily. 05/26/19  Yes Roma Schanz R, DO  simvastatin (ZOCOR) 20 MG tablet TAKE 1 TABLET BY MOUTH AT  BEDTIME Patient taking differently: Take 20 mg by  mouth at bedtime.  07/15/19  Yes Roma Schanz R, DO  traZODone (DESYREL) 50 MG tablet TAKE 1 TABLET BY MOUTH AT BEDTIME Patient taking differently: Take 50 mg by mouth at bedtime.  07/24/19  Yes [name], DO  aspirin EC 325 MG EC tablet Take 1 tablet (325 mg total) by mouth 2 (two) times daily. Patient not taking: Reported on 08/25/2019 04/29/19   Ardeen Jourdain, PA-C  levothyroxine (SYNTHROID) 88 MCG tablet Take 1 tablet (88 mcg total) by mouth as directed. 2 tablets on Sundays, 1 tablet Monday through Saturday 08/27/19   Shamleffer, Melanie Crazier, MD    Allergies  Allergen Reactions  . Morphine And Related Swelling  . 5-Alpha Reductase Inhibitors   . Amitriptyline Swelling    Leg swelling  . Gabapentin Itching  . Triamterene Itching and Rash    Social History   Socioeconomic History  . Marital status: Divorced    Spouse name: Not on file  . Number of children: Not on file  . Years  of education: Not on file  . Highest education level: Not on file  Occupational History  . Not on file  Tobacco Use  . Smoking status: Current Every Day Smoker    Packs/day: 1.00    Years: 50.00    Pack years: 50.00    Types: Cigarettes  . Smokeless tobacco: Never Used  Substance and Sexual Activity  . Alcohol use: No    Alcohol/week: 0.0 standard drinks  . Drug use: No  . Sexual activity: Not Currently  Other Topics Concern  . Not on file  Social History Narrative   Divorced   Current Smoker  1 ppd -  20 yrs      Alcohol use-no       [employer] - laid off       Physician roster:   Dr. Elta Guadeloupe Philips - pain management   Dr. Philis Pique - GYN   Dr. Saintclair Halsted - Neurosurgery   Dr. Ronnald Ramp - dermatology   Dr. Caralyn Guile - orthopedics   Dr. Fransico Him - cardiology   Dr. Miller-ophthalmology   Social Determinants of Health   Financial Resource Strain:   . Difficulty of Paying Living Expenses: Not on file  Food Insecurity:   . Worried About Charity fundraiser in the Last Year: Not on file  . Ran Out of Food in the Last Year: Not on file  Transportation Needs:   . Lack of Transportation (Medical): Not on file  . Lack of Transportation (Non-Medical): Not on file  Physical Activity:   . Days of Exercise per Week: Not on file  . Minutes of Exercise per Session: Not on file  Stress:   . Feeling of Stress : Not on file  Social Connections:   . Frequency of Communication with Friends and Family: Not on file  . Frequency of Social Gatherings with Friends and Family: Not on file  . Attends Religious Services: Not on file  . Active Member of Clubs or Organizations: Not on file  . Attends Archivist Meetings: Not on file  . Marital Status: Not on file  Intimate Partner Violence:   . Fear of Current or Ex-Partner: Not on file  . Emotionally Abused: Not on file  . Physically Abused: Not on file  . Sexually Abused: Not on file      Tobacco Use: High Risk  .  Smoking Tobacco Use: Current Every Day Smoker  . Smokeless Tobacco Use:  Never Used   Social History   Substance and Sexual Activity  Alcohol Use No  . Alcohol/week: 0.0 standard drinks    Family History  Problem Relation Age of Onset  . Pancreatic cancer Father   . Cancer Father   . Diabetes type II Mother   . Hypertension Mother   . Coronary artery disease Mother 53  . Diabetes Mother   . Thyroid cancer Mother   . Skin cancer Mother   . Diabetes Sister   . Hypertension Sister   . Bone cancer Sister   . Breast cancer Sister   . Hypertension Brother   . Breast cancer Maternal Aunt   . Other Neg Hx        No family history of  colon cancer    Review of Systems  Constitutional: Negative for chills and fever.  HENT: Negative for congestion, sore throat and tinnitus.   Eyes: Negative for double vision, photophobia and pain.  Respiratory: Negative for cough, shortness of breath and wheezing.   Cardiovascular: Negative for chest pain, palpitations and orthopnea.  Gastrointestinal: Negative for heartburn, nausea and vomiting.  Genitourinary: Negative for dysuria, frequency and urgency.  Musculoskeletal: Positive for joint pain.  Neurological: Negative for dizziness, weakness and headaches.     Objective:  Physical Exam: Well nourished and well developed.  General: Alert and oriented x3, cooperative and pleasant, no acute distress.  Head: normocephalic, atraumatic, neck supple.  Eyes: EOMI.  Respiratory: breath sounds clear in all fields, no wheezing, rales, or rhonchi. Cardiovascular: Regular rate and rhythm, no murmurs, gallops or rubs.  Abdomen: non-tender to palpation and soft, normoactive bowel sounds. Musculoskeletal:  Right Hip Exam: Mild tenderness to palpation about the right greater trochanteric bursa. No tenderness to palpation about the left greater trochanteric bursa. No masses or tumors palpated about the hips. Pain with passive and active motion of the  right hip. ROM right hip 120 degrees flexion, 20 degrees internal rotation, 30 degrees external rotation, and 30 degrees abduction. Motion of the right hip causes pain into the right knee.  Calves soft and nontender. Motor function intact in LE. Strength 5/5 LE bilaterally. Neuro: Distal pulses 2+. Sensation to light touch intact in LE.   Vital signs in last 24 hours:  Blood pressure: 118/70 mmHg  Imaging Review Plain radiographs demonstrate severe degenerative joint disease of the right hip. The bone quality appears to be adequate for age and reported activity level.  Assessment/Plan:  End stage arthritis, right hip  The patient history, physical examination, clinical judgement of the provider and imaging studies are consistent with end stage degenerative joint disease of the right hip and total hip arthroplasty is deemed medically necessary. The treatment options including medical management, injection therapy, arthroscopy and arthroplasty were discussed at length. The risks and benefits of total hip arthroplasty were presented and reviewed. The risks due to aseptic loosening, infection, stiffness, dislocation/subluxation, thromboembolic complications and other imponderables were discussed. The patient acknowledged the explanation, agreed to proceed with the plan and consent was signed. Patient is being admitted for inpatient treatment for surgery, pain control, PT, OT, prophylactic antibiotics, VTE prophylaxis, progressive ambulation and ADLs and discharge planning.The patient is planning to be discharged home.  Anticipated LOS equal to or greater than 2 midnights due to - Age 6 and older with one or more of the following:  - Obesity  - Expected need for hospital services (PT, OT, Nursing) required for safe  discharge  - Anticipated need for postoperative skilled  nursing care or inpatient rehab  - Active co-morbidities: Chronic pain requiring opiods OR   - Unanticipated findings  during/Post Surgery: None  - Patient is a high risk of re-admission due to: None   Therapy Plans: HEP Disposition: Home with son Planned DVT Prophylaxis: Aspirin 325 mg BID DME Needed: None PCP: Roma Schanz, DO  Cardiologist: Fransico Him, MD TXA: IV Allergies: Morphine (edema), gabapentin (itching) Anesthesia Concerns: None BMI: 33.4  Other: Does not want spinal anesthesia** Patient's son will not be able to come stay until Friday Post-operative pain management will be complicated by chronic pain medications. Currently taking Percocet 10-325 mg Q4-5 hours.   - Patient was instructed on what medications to stop prior to surgery. - Follow-up visit in 2 weeks with Dr. Wynelle Link - Begin physical therapy following surgery - Pre-operative lab work as pre-surgical testing - Prescriptions will be provided in hospital at time of discharge  Theresa Duty, PA-C Orthopedic Surgery [facility] [place]

## 2019-09-08 NOTE — Anesthesia Preprocedure Evaluation (Addendum)
Anesthesia Evaluation  Patient identified by MRN, date of birth, ID band Patient awake    Reviewed: Allergy & Precautions, Patient's Chart, lab work & pertinent test results  History of Anesthesia Complications (+) PONV  Airway Mallampati: II  TM Distance: >3 FB Neck ROM: Full    Dental no notable dental hx. (+) Implants, Caps, Teeth Intact, Dental Advisory Given   Pulmonary Current SmokerPatient did not abstain from smoking.,    Pulmonary exam normal breath sounds clear to auscultation       Cardiovascular hypertension, Pt. on medications Normal cardiovascular exam Rhythm:Regular Rate:Normal     Neuro/Psych negative neurological ROS  negative psych ROS   GI/Hepatic Neg liver ROS, GERD  ,  Endo/Other  Hypothyroidism   Renal/GU negative Renal ROSK+ 3.5 Cr 1.18     Musculoskeletal  (+) Arthritis ,   Abdominal (+) + obese,   Peds  Hematology  (+) anemia , Hgb 13.3   Anesthesia Other Findings   Reproductive/Obstetrics                           Anesthesia Physical Anesthesia Plan  ASA: III  Anesthesia Plan: General   Post-op Pain Management:    Induction: Intravenous  PONV Risk Score and Plan: 3 and Treatment may vary due to age or medical condition, Ondansetron and Dexamethasone  Airway Management Planned: Oral ETT  Additional Equipment: None  Intra-op Plan:   Post-operative Plan:   Informed Consent: I have reviewed the patients History and Physical, chart, labs and discussed the procedure including the risks, benefits and alternatives for the proposed anesthesia with the patient or authorized representative who has indicated his/her understanding and acceptance.     Dental advisory given  Plan Discussed with: CRNA  Anesthesia Plan Comments:        Anesthesia Quick Evaluation

## 2019-09-09 ENCOUNTER — Inpatient Hospital Stay (HOSPITAL_COMMUNITY): Payer: Medicare Other | Admitting: Physician Assistant

## 2019-09-09 ENCOUNTER — Inpatient Hospital Stay (HOSPITAL_COMMUNITY): Payer: Medicare Other

## 2019-09-09 ENCOUNTER — Inpatient Hospital Stay (HOSPITAL_COMMUNITY)
Admission: RE | Admit: 2019-09-09 | Discharge: 2019-09-11 | DRG: 470 | Disposition: A | Discharge status: Home / Self Care | Payer: Medicare Other | Source: Other Acute Inpatient Hospital | Attending: Orthopedic Surgery | Admitting: Orthopedic Surgery

## 2019-09-09 ENCOUNTER — Other Ambulatory Visit: Payer: Self-pay

## 2019-09-09 ENCOUNTER — Inpatient Hospital Stay (HOSPITAL_COMMUNITY): Payer: Medicare Other | Admitting: Anesthesiology

## 2019-09-09 ENCOUNTER — Encounter (HOSPITAL_COMMUNITY): Payer: Self-pay | Admitting: Orthopedic Surgery

## 2019-09-09 ENCOUNTER — Encounter (HOSPITAL_COMMUNITY)
Admission: RE | Disposition: A | Discharge status: Home / Self Care | Payer: Self-pay | Source: Other Acute Inpatient Hospital | Attending: Orthopedic Surgery

## 2019-09-09 DIAGNOSIS — Z471 Aftercare following joint replacement surgery: Secondary | ICD-10-CM | POA: Diagnosis not present

## 2019-09-09 DIAGNOSIS — E785 Hyperlipidemia, unspecified: Secondary | ICD-10-CM | POA: Diagnosis not present

## 2019-09-09 DIAGNOSIS — M62838 Other muscle spasm: Secondary | ICD-10-CM | POA: Diagnosis present

## 2019-09-09 DIAGNOSIS — I10 Essential (primary) hypertension: Secondary | ICD-10-CM | POA: Diagnosis present

## 2019-09-09 DIAGNOSIS — Z96649 Presence of unspecified artificial hip joint: Secondary | ICD-10-CM

## 2019-09-09 DIAGNOSIS — M1611 Unilateral primary osteoarthritis, right hip: Principal | ICD-10-CM | POA: Diagnosis present

## 2019-09-09 DIAGNOSIS — Z96641 Presence of right artificial hip joint: Secondary | ICD-10-CM | POA: Diagnosis not present

## 2019-09-09 DIAGNOSIS — Z79899 Other long term (current) drug therapy: Secondary | ICD-10-CM

## 2019-09-09 DIAGNOSIS — F17210 Nicotine dependence, cigarettes, uncomplicated: Secondary | ICD-10-CM | POA: Diagnosis present

## 2019-09-09 DIAGNOSIS — K219 Gastro-esophageal reflux disease without esophagitis: Secondary | ICD-10-CM | POA: Diagnosis present

## 2019-09-09 DIAGNOSIS — E669 Obesity, unspecified: Secondary | ICD-10-CM | POA: Diagnosis present

## 2019-09-09 DIAGNOSIS — Z96651 Presence of right artificial knee joint: Secondary | ICD-10-CM | POA: Diagnosis present

## 2019-09-09 DIAGNOSIS — Z79890 Hormone replacement therapy (postmenopausal): Secondary | ICD-10-CM | POA: Diagnosis not present

## 2019-09-09 DIAGNOSIS — Z20822 Contact with and (suspected) exposure to covid-19: Secondary | ICD-10-CM | POA: Diagnosis not present

## 2019-09-09 DIAGNOSIS — G8929 Other chronic pain: Secondary | ICD-10-CM | POA: Diagnosis not present

## 2019-09-09 DIAGNOSIS — Z6833 Body mass index (BMI) 33.0-33.9, adult: Secondary | ICD-10-CM

## 2019-09-09 DIAGNOSIS — M1712 Unilateral primary osteoarthritis, left knee: Secondary | ICD-10-CM | POA: Diagnosis present

## 2019-09-09 DIAGNOSIS — E039 Hypothyroidism, unspecified: Secondary | ICD-10-CM | POA: Diagnosis not present

## 2019-09-09 DIAGNOSIS — Z419 Encounter for procedure for purposes other than remedying health state, unspecified: Secondary | ICD-10-CM

## 2019-09-09 DIAGNOSIS — Z90710 Acquired absence of both cervix and uterus: Secondary | ICD-10-CM

## 2019-09-09 DIAGNOSIS — M25551 Pain in right hip: Secondary | ICD-10-CM | POA: Diagnosis present

## 2019-09-09 DIAGNOSIS — E876 Hypokalemia: Secondary | ICD-10-CM | POA: Diagnosis not present

## 2019-09-09 DIAGNOSIS — I493 Ventricular premature depolarization: Secondary | ICD-10-CM | POA: Diagnosis not present

## 2019-09-09 HISTORY — PX: TOTAL HIP ARTHROPLASTY: SHX124

## 2019-09-09 LAB — TYPE AND SCREEN
ABO/RH(D): A POS
Antibody Screen: NEGATIVE

## 2019-09-09 SURGERY — ARTHROPLASTY, HIP, TOTAL, ANTERIOR APPROACH
Anesthesia: General | Site: Hip | Laterality: Right

## 2019-09-09 MED ORDER — ASPIRIN EC 325 MG PO TBEC: 325.0000 mg | DELAYED_RELEASE_TABLET | Freq: Two times a day (BID) | ORAL | Status: DC

## 2019-09-09 MED ORDER — CHLORHEXIDINE GLUCONATE 4 % EX LIQD: 60.0000 mL | Freq: Once | CUTANEOUS | Status: DC

## 2019-09-09 MED ORDER — FENTANYL CITRATE (PF) 100 MCG/2ML IJ SOLN
INTRAMUSCULAR | Status: AC
  Filled 2019-09-09: qty 2

## 2019-09-09 MED ORDER — ONDANSETRON HCL 4 MG/2ML IJ SOLN
INTRAMUSCULAR | Status: AC
  Filled 2019-09-09: qty 2

## 2019-09-09 MED ORDER — KETAMINE HCL 10 MG/ML IJ SOLN
INTRAMUSCULAR | Status: AC
  Filled 2019-09-09: qty 1

## 2019-09-09 MED ORDER — METOCLOPRAMIDE HCL 5 MG/ML IJ SOLN: 5.0000 mg | Freq: Three times a day (TID) | INTRAMUSCULAR | Status: DC | PRN

## 2019-09-09 MED ORDER — POTASSIUM CHLORIDE CRYS ER 20 MEQ PO TBCR
60.0000 meq | EXTENDED_RELEASE_TABLET | Freq: Every day | ORAL | Status: DC
  Administered 2019-09-10 – 2019-09-11 (×2): 60 meq via ORAL
  Filled 2019-09-09 (×2): qty 3

## 2019-09-09 MED ORDER — CARISOPRODOL 350 MG PO TABS
175.0000 mg | ORAL_TABLET | Freq: Three times a day (TID) | ORAL | Status: DC
  Administered 2019-09-09 – 2019-09-11 (×6): 175 mg via ORAL
  Filled 2019-09-09 (×6): qty 1

## 2019-09-09 MED ORDER — OXYCODONE HCL 5 MG PO TABS
5.0000 mg | ORAL_TABLET | ORAL | Status: DC | PRN
  Administered 2019-09-09: 10 mg via ORAL
  Administered 2019-09-11: 5 mg via ORAL
  Administered 2019-09-11: 10 mg via ORAL
  Filled 2019-09-09 (×3): qty 2

## 2019-09-09 MED ORDER — MIDAZOLAM HCL 2 MG/2ML IJ SOLN
INTRAMUSCULAR | Status: AC
  Filled 2019-09-09: qty 2

## 2019-09-09 MED ORDER — LIDOCAINE 2% (20 MG/ML) 5 ML SYRINGE
INTRAMUSCULAR | Status: DC | PRN
  Administered 2019-09-09: 100 mg via INTRAVENOUS

## 2019-09-09 MED ORDER — DOCUSATE SODIUM 100 MG PO CAPS
100.0000 mg | ORAL_CAPSULE | Freq: Two times a day (BID) | ORAL | Status: DC
  Administered 2019-09-09 – 2019-09-11 (×5): 100 mg via ORAL
  Filled 2019-09-09 (×5): qty 1

## 2019-09-09 MED ORDER — TRANEXAMIC ACID-NACL 1000-0.7 MG/100ML-% IV SOLN
1000.0000 mg | INTRAVENOUS | Status: AC
  Administered 2019-09-09: 09:00:00 1000 mg via INTRAVENOUS
  Filled 2019-09-09: qty 100

## 2019-09-09 MED ORDER — POLYETHYLENE GLYCOL 3350 17 G PO PACK: 17.0000 g | PACK | Freq: Every day | ORAL | Status: DC | PRN

## 2019-09-09 MED ORDER — SUGAMMADEX SODIUM 200 MG/2ML IV SOLN
INTRAVENOUS | Status: DC | PRN
  Administered 2019-09-09: 170 mg via INTRAVENOUS

## 2019-09-09 MED ORDER — TRAZODONE HCL 50 MG PO TABS
50.0000 mg | ORAL_TABLET | Freq: Every day | ORAL | Status: DC
  Administered 2019-09-09 – 2019-09-11 (×2): 50 mg via ORAL
  Filled 2019-09-09 (×3): qty 1

## 2019-09-09 MED ORDER — BUPIVACAINE HCL 0.25 % IJ SOLN
INTRAMUSCULAR | Status: AC
  Filled 2019-09-09: qty 1

## 2019-09-09 MED ORDER — MENTHOL 3 MG MT LOZG: 1.0000 | LOZENGE | OROMUCOSAL | Status: DC | PRN

## 2019-09-09 MED ORDER — OXYCODONE HCL 5 MG PO TABS
10.0000 mg | ORAL_TABLET | ORAL | Status: DC | PRN
  Administered 2019-09-09 – 2019-09-10 (×8): 15 mg via ORAL
  Administered 2019-09-11: 10 mg via ORAL
  Filled 2019-09-09 (×9): qty 3

## 2019-09-09 MED ORDER — ACETAMINOPHEN 10 MG/ML IV SOLN: 1000.0000 mg | Freq: Once | INTRAVENOUS | Status: DC | PRN

## 2019-09-09 MED ORDER — FUROSEMIDE 40 MG PO TABS
40.0000 mg | ORAL_TABLET | Freq: Every day | ORAL | Status: DC
  Administered 2019-09-11: 40 mg via ORAL
  Filled 2019-09-09 (×3): qty 1

## 2019-09-09 MED ORDER — DIPHENHYDRAMINE HCL 12.5 MG/5ML PO ELIX: 12.5000 mg | ORAL_SOLUTION | ORAL | Status: DC | PRN

## 2019-09-09 MED ORDER — DEXAMETHASONE SODIUM PHOSPHATE 10 MG/ML IJ SOLN: 8.0000 mg | Freq: Once | INTRAMUSCULAR | Status: DC

## 2019-09-09 MED ORDER — FENOFIBRATE 160 MG PO TABS
160.0000 mg | ORAL_TABLET | Freq: Every day | ORAL | Status: DC
  Administered 2019-09-10 – 2019-09-11 (×2): 160 mg via ORAL
  Filled 2019-09-09 (×2): qty 1

## 2019-09-09 MED ORDER — BISACODYL 5 MG PO TBEC: 5.0000 mg | DELAYED_RELEASE_TABLET | Freq: Every day | ORAL | Status: DC | PRN

## 2019-09-09 MED ORDER — ONDANSETRON HCL 4 MG/2ML IJ SOLN
INTRAMUSCULAR | Status: DC | PRN
  Administered 2019-09-09: 4 mg via INTRAVENOUS

## 2019-09-09 MED ORDER — HYDROMORPHONE HCL 1 MG/ML IJ SOLN
0.2500 mg | INTRAMUSCULAR | Status: DC | PRN
  Administered 2019-09-09 (×3): 0.5 mg via INTRAVENOUS

## 2019-09-09 MED ORDER — DEXAMETHASONE SODIUM PHOSPHATE 10 MG/ML IJ SOLN
INTRAMUSCULAR | Status: AC
  Filled 2019-09-09: qty 1

## 2019-09-09 MED ORDER — 0.9 % SODIUM CHLORIDE (POUR BTL) OPTIME
TOPICAL | Status: DC | PRN
  Administered 2019-09-09: 1000 mL

## 2019-09-09 MED ORDER — METOCLOPRAMIDE HCL 5 MG PO TABS: 5.0000 mg | ORAL_TABLET | Freq: Three times a day (TID) | ORAL | Status: DC | PRN

## 2019-09-09 MED ORDER — WATER FOR IRRIGATION, STERILE IR SOLN
Status: DC | PRN
  Administered 2019-09-09 (×2): 1000 mL

## 2019-09-09 MED ORDER — HYDROMORPHONE HCL 1 MG/ML IJ SOLN
INTRAMUSCULAR | Status: AC
  Administered 2019-09-09: 0.5 mg via INTRAVENOUS
  Filled 2019-09-09: qty 2

## 2019-09-09 MED ORDER — MIDAZOLAM HCL 2 MG/2ML IJ SOLN
INTRAMUSCULAR | Status: DC | PRN
  Administered 2019-09-09: 2 mg via INTRAVENOUS

## 2019-09-09 MED ORDER — POVIDONE-IODINE 10 % EX SWAB: 2.0000 "application " | Freq: Once | CUTANEOUS | Status: DC

## 2019-09-09 MED ORDER — ACETAMINOPHEN 325 MG PO TABS
325.0000 mg | ORAL_TABLET | Freq: Four times a day (QID) | ORAL | Status: DC | PRN
  Administered 2019-09-10 (×2): 650 mg via ORAL
  Filled 2019-09-09 (×2): qty 2

## 2019-09-09 MED ORDER — CEFAZOLIN SODIUM-DEXTROSE 2-4 GM/100ML-% IV SOLN
2.0000 g | Freq: Four times a day (QID) | INTRAVENOUS | Status: AC
  Administered 2019-09-09 (×2): 2 g via INTRAVENOUS
  Filled 2019-09-09 (×2): qty 100

## 2019-09-09 MED ORDER — PHENOL 1.4 % MT LIQD
1.0000 | OROMUCOSAL | Status: DC | PRN
  Filled 2019-09-09: qty 177

## 2019-09-09 MED ORDER — NICOTINE 14 MG/24HR TD PT24
14.0000 mg | MEDICATED_PATCH | Freq: Every day | TRANSDERMAL | Status: DC
  Administered 2019-09-09 – 2019-09-11 (×3): 14 mg via TRANSDERMAL
  Filled 2019-09-09 (×3): qty 1

## 2019-09-09 MED ORDER — ONDANSETRON HCL 4 MG/2ML IJ SOLN: 4.0000 mg | Freq: Once | INTRAMUSCULAR | Status: DC | PRN

## 2019-09-09 MED ORDER — BISACODYL 10 MG RE SUPP: 10.0000 mg | Freq: Every day | RECTAL | Status: DC | PRN

## 2019-09-09 MED ORDER — LACTATED RINGERS IV SOLN: INTRAVENOUS | Status: DC

## 2019-09-09 MED ORDER — DEXAMETHASONE SODIUM PHOSPHATE 10 MG/ML IJ SOLN
INTRAMUSCULAR | Status: DC | PRN
  Administered 2019-09-09: 10 mg via INTRAVENOUS

## 2019-09-09 MED ORDER — FENTANYL CITRATE (PF) 100 MCG/2ML IJ SOLN
25.0000 ug | INTRAMUSCULAR | Status: DC | PRN
  Administered 2019-09-09 (×3): 25 ug via INTRAVENOUS

## 2019-09-09 MED ORDER — PROPOFOL 10 MG/ML IV BOLUS
INTRAVENOUS | Status: DC | PRN
  Administered 2019-09-09: 120 mg via INTRAVENOUS

## 2019-09-09 MED ORDER — KETAMINE HCL 10 MG/ML IJ SOLN
INTRAMUSCULAR | Status: DC | PRN
  Administered 2019-09-09: 30 mg via INTRAVENOUS
  Administered 2019-09-09 (×3): 10 mg via INTRAVENOUS

## 2019-09-09 MED ORDER — PROPOFOL 10 MG/ML IV BOLUS
INTRAVENOUS | Status: AC
  Filled 2019-09-09: qty 20

## 2019-09-09 MED ORDER — SIMVASTATIN 20 MG PO TABS
20.0000 mg | ORAL_TABLET | Freq: Every day | ORAL | Status: DC
  Administered 2019-09-09 – 2019-09-10 (×2): 20 mg via ORAL
  Filled 2019-09-09 (×2): qty 1

## 2019-09-09 MED ORDER — CEFAZOLIN SODIUM-DEXTROSE 2-4 GM/100ML-% IV SOLN
2.0000 g | INTRAVENOUS | Status: AC
  Administered 2019-09-09: 08:00:00 2 g via INTRAVENOUS
  Filled 2019-09-09: qty 100

## 2019-09-09 MED ORDER — BUPIVACAINE HCL 0.25 % IJ SOLN
INTRAMUSCULAR | Status: DC | PRN
  Administered 2019-09-09: 30 mL

## 2019-09-09 MED ORDER — ACETAMINOPHEN 10 MG/ML IV SOLN
1000.0000 mg | Freq: Four times a day (QID) | INTRAVENOUS | Status: DC
  Administered 2019-09-09: 1000 mg via INTRAVENOUS
  Filled 2019-09-09: qty 100

## 2019-09-09 MED ORDER — DORZOLAMIDE HCL-TIMOLOL MAL 2-0.5 % OP SOLN
1.0000 [drp] | Freq: Two times a day (BID) | OPHTHALMIC | Status: DC
  Administered 2019-09-09 – 2019-09-11 (×4): 1 [drp] via OPHTHALMIC
  Filled 2019-09-09: qty 10

## 2019-09-09 MED ORDER — METHOCARBAMOL 500 MG IVPB - SIMPLE MED
INTRAVENOUS | Status: AC
  Filled 2019-09-09: qty 50

## 2019-09-09 MED ORDER — LEVOTHYROXINE SODIUM 88 MCG PO TABS
88.0000 ug | ORAL_TABLET | ORAL | Status: DC
  Administered 2019-09-10 – 2019-09-11 (×2): 88 ug via ORAL
  Filled 2019-09-09 (×2): qty 1

## 2019-09-09 MED ORDER — HYDROMORPHONE HCL 1 MG/ML IJ SOLN
0.5000 mg | INTRAMUSCULAR | Status: DC | PRN
  Administered 2019-09-09 – 2019-09-10 (×6): 1 mg via INTRAVENOUS
  Filled 2019-09-09 (×7): qty 1

## 2019-09-09 MED ORDER — HYDROCHLOROTHIAZIDE 25 MG PO TABS
25.0000 mg | ORAL_TABLET | Freq: Every day | ORAL | Status: DC
  Administered 2019-09-10 – 2019-09-11 (×2): 25 mg via ORAL
  Filled 2019-09-09 (×3): qty 1

## 2019-09-09 MED ORDER — ESTRADIOL 1 MG PO TABS
0.5000 mg | ORAL_TABLET | Freq: Every day | ORAL | Status: DC
  Administered 2019-09-09 – 2019-09-10 (×2): 0.5 mg via ORAL
  Filled 2019-09-09 (×3): qty 0.5

## 2019-09-09 MED ORDER — FENTANYL CITRATE (PF) 250 MCG/5ML IJ SOLN
INTRAMUSCULAR | Status: DC | PRN
  Administered 2019-09-09: 75 ug via INTRAVENOUS
  Administered 2019-09-09: 50 ug via INTRAVENOUS
  Administered 2019-09-09: 25 ug via INTRAVENOUS
  Administered 2019-09-09 (×5): 50 ug via INTRAVENOUS

## 2019-09-09 MED ORDER — FENTANYL CITRATE (PF) 100 MCG/2ML IJ SOLN
INTRAMUSCULAR | Status: AC
  Administered 2019-09-09: 25 ug via INTRAVENOUS
  Filled 2019-09-09: qty 4

## 2019-09-09 MED ORDER — ONDANSETRON HCL 4 MG PO TABS: 4.0000 mg | ORAL_TABLET | Freq: Four times a day (QID) | ORAL | Status: DC | PRN

## 2019-09-09 MED ORDER — ONDANSETRON HCL 4 MG/2ML IJ SOLN: 4.0000 mg | Freq: Four times a day (QID) | INTRAMUSCULAR | Status: DC | PRN

## 2019-09-09 MED ORDER — ASPIRIN EC 325 MG PO TBEC
325.0000 mg | DELAYED_RELEASE_TABLET | Freq: Two times a day (BID) | ORAL | Status: DC
  Administered 2019-09-10 – 2019-09-11 (×3): 325 mg via ORAL
  Filled 2019-09-09 (×3): qty 1

## 2019-09-09 MED ORDER — LEVOTHYROXINE SODIUM 88 MCG PO TABS: 176.0000 ug | ORAL_TABLET | ORAL | Status: DC

## 2019-09-09 MED ORDER — ROCURONIUM BROMIDE 10 MG/ML (PF) SYRINGE
PREFILLED_SYRINGE | INTRAVENOUS | Status: DC | PRN
  Administered 2019-09-09: 10 mg via INTRAVENOUS
  Administered 2019-09-09: 55 mg via INTRAVENOUS

## 2019-09-09 MED ORDER — SODIUM CHLORIDE 0.9 % IV SOLN: INTRAVENOUS | Status: DC

## 2019-09-09 MED ORDER — LIDOCAINE 2% (20 MG/ML) 5 ML SYRINGE
INTRAMUSCULAR | Status: AC
  Filled 2019-09-09: qty 5

## 2019-09-09 MED ORDER — MAGNESIUM CITRATE PO SOLN: 1.0000 | Freq: Once | ORAL | Status: DC | PRN

## 2019-09-09 SURGICAL SUPPLY — 45 items
BAG DECANTER FOR FLEXI CONT (MISCELLANEOUS) IMPLANT
BAG ZIPLOCK 12X15 (MISCELLANEOUS) IMPLANT
BLADE SAG 18X100X1.27 (BLADE) ×2 IMPLANT
COVER PERINEAL POST (MISCELLANEOUS) ×2 IMPLANT
COVER SURGICAL LIGHT HANDLE (MISCELLANEOUS) ×2 IMPLANT
COVER WAND RF STERILE (DRAPES) IMPLANT
CUP ACETBLR 48 OD SECTOR II (Hips) ×2 IMPLANT
DECANTER SPIKE VIAL GLASS SM (MISCELLANEOUS) ×2 IMPLANT
DRAPE STERI IOBAN 125X83 (DRAPES) ×2 IMPLANT
DRAPE U-SHAPE 47X51 STRL (DRAPES) ×4 IMPLANT
DRSG ADAPTIC 3X8 NADH LF (GAUZE/BANDAGES/DRESSINGS) ×2 IMPLANT
DRSG MEPILEX BORDER 4X4 (GAUZE/BANDAGES/DRESSINGS) ×2 IMPLANT
DRSG MEPILEX BORDER 4X8 (GAUZE/BANDAGES/DRESSINGS) ×2 IMPLANT
DURAPREP 26ML APPLICATOR (WOUND CARE) ×2 IMPLANT
ELECT REM PT RETURN 15FT ADLT (MISCELLANEOUS) ×2 IMPLANT
EVACUATOR 1/8 PVC DRAIN (DRAIN) ×2 IMPLANT
GLOVE BIO SURGEON STRL SZ 6 (GLOVE) IMPLANT
GLOVE BIO SURGEON STRL SZ7 (GLOVE) IMPLANT
GLOVE BIO SURGEON STRL SZ8 (GLOVE) ×2 IMPLANT
GLOVE BIOGEL PI IND STRL 6.5 (GLOVE) IMPLANT
GLOVE BIOGEL PI IND STRL 7.0 (GLOVE) IMPLANT
GLOVE BIOGEL PI IND STRL 8 (GLOVE) ×1 IMPLANT
GLOVE BIOGEL PI INDICATOR 6.5 (GLOVE)
GLOVE BIOGEL PI INDICATOR 7.0 (GLOVE)
GLOVE BIOGEL PI INDICATOR 8 (GLOVE) ×1
GOWN STRL REUS W/TWL LRG LVL3 (GOWN DISPOSABLE) ×2 IMPLANT
GOWN STRL REUS W/TWL XL LVL3 (GOWN DISPOSABLE) IMPLANT
HEAD CERAMIC DELTA 28 P1.5 HIP (Head) ×2 IMPLANT
HOLDER FOLEY CATH W/STRAP (MISCELLANEOUS) ×2 IMPLANT
KIT TURNOVER KIT A (KITS) IMPLANT
LINER MARATHON 28 48 (Hips) ×2 IMPLANT
MANIFOLD NEPTUNE II (INSTRUMENTS) ×2 IMPLANT
PACK ANTERIOR HIP CUSTOM (KITS) ×2 IMPLANT
PENCIL SMOKE EVACUATOR COATED (MISCELLANEOUS) ×2 IMPLANT
STEM FEMORAL SZ 5MM STD ACTIS (Stem) ×2 IMPLANT
STRIP CLOSURE SKIN 1/2X4 (GAUZE/BANDAGES/DRESSINGS) ×4 IMPLANT
SUT ETHIBOND NAB CT1 #1 30IN (SUTURE) ×2 IMPLANT
SUT MNCRL AB 4-0 PS2 18 (SUTURE) ×2 IMPLANT
SUT STRATAFIX 0 PDS 27 VIOLET (SUTURE) ×2
SUT VIC AB 2-0 CT1 27 (SUTURE) ×4
SUT VIC AB 2-0 CT1 TAPERPNT 27 (SUTURE) ×2 IMPLANT
SUTURE STRATFX 0 PDS 27 VIOLET (SUTURE) ×1 IMPLANT
SYR 50ML LL SCALE MARK (SYRINGE) IMPLANT
TRAY FOLEY MTR SLVR 16FR STAT (SET/KITS/TRAYS/PACK) ×2 IMPLANT
YANKAUER SUCT BULB TIP 10FT TU (MISCELLANEOUS) ×2 IMPLANT

## 2019-09-09 NOTE — Transfer of Care (Signed)
Immediate Anesthesia Transfer of Care Note  Patient: Joan [name]  Procedure(s) Performed: TOTAL HIP ARTHROPLASTY ANTERIOR APPROACH (Right Hip)  Patient Location: PACU  Anesthesia Type:General  Level of Consciousness: awake and oriented  Airway & Oxygen Therapy: Patient Spontanous Breathing and Patient connected to face mask oxygen  Post-op Assessment: Report given to RN and Post -op Vital signs reviewed and stable  Post vital signs: Reviewed and stable  Last Vitals:  Vitals Value Taken Time  BP    Temp    Pulse    Resp    SpO2      Last Pain:  Vitals:   09/09/19 0717  TempSrc: Oral  PainSc: 8          Complications: No apparent anesthesia complications

## 2019-09-09 NOTE — Interval H&P Note (Signed)
History and Physical Interval Note:  09/09/2019 7:26 AM  Joan [name]  has presented today for surgery, with the diagnosis of Right hip osteoarthritis.  The various methods of treatment have been discussed with the patient and family. After consideration of risks, benefits and other options for treatment, the patient has consented to  Procedure(s) with comments: [place] (Right) - 162min as a surgical intervention.  The patient's history has been reviewed, patient examined, no change in status, stable for surgery.  I have reviewed the patient's chart and labs.  Questions were answered to the patient's satisfaction.     Pilar Plate Myshawn [place]

## 2019-09-09 NOTE — Evaluation (Signed)
Physical Therapy Evaluation Patient Details Name: Joan [name] MRN: [id number] DOB: [birth year] Today's Date: 09/09/2019   History of Present Illness  s/p R THA. PMH: L THA, TKA, multiple back surgeries (see chart for full Hx)  Clinical Impression  Pt is s/p THA resulting in the deficits listed below (see PT Problem List).  Pt stating her pain was elevated despite meds but agreeable to OOB. Amb 30' with RW and min assist.   Pt will benefit from skilled PT to increase their independence and safety with mobility to allow discharge to the venue listed below.      Follow Up Recommendations Follow surgeon's recommendation for DC plan and follow-up therapies    Equipment Recommendations  None recommended by PT    Recommendations for Other Services       Precautions / Restrictions Precautions Precautions: Fall Restrictions Weight Bearing Restrictions: No RLE Weight Bearing: Weight bearing as tolerated      Mobility  Bed Mobility Overal bed mobility: Needs Assistance Bed Mobility: Supine to Sit     Supine to sit: Min assist;HOB elevated     General bed mobility comments: assist with RLE, HOB elevated ~ 70 degrees  per pt request, incr time  Transfers Overall transfer level: Needs assistance Equipment used: Rolling walker (2 wheeled) Transfers: Sit to/from Stand Sit to Stand: Min assist;From elevated surface         General transfer comment: cues for hand placement and safety  Ambulation/Gait Ambulation/Gait assistance: Min guard Gait Distance (Feet): 30 Feet Assistive device: Rolling walker (2 wheeled) Gait Pattern/deviations: Step-to pattern;Decreased weight shift to right;Decreased stance time - right     General Gait Details: cues for sequence and RW position. pt states not to tell her to stand up straight be she cannot at her baseline (does not have full trunk extension at baseline)  Stairs            Wheelchair Mobility    Modified Rankin (Stroke  Patients Only)       Balance                                             Pertinent Vitals/Pain Pain Assessment: 0-10 Pain Score: 8  Pain Location: right hip Pain Descriptors / Indicators: Aching;Grimacing;Constant Pain Intervention(s): Limited activity within patient's tolerance;Monitored during session    [place] expects to be discharged to:: Private residence Living Arrangements: Alone Available Help at Discharge: Family Type of Home: Apartment Home Access: Stairs to enter Entrance Stairs-Rails: None Technical brewer of Steps: 1 Home Layout: One level        Prior Function Level of Independence: Independent               Hand Dominance        Extremity/Trunk Assessment   Upper Extremity Assessment Upper Extremity Assessment: Overall WFL for tasks assessed    Lower Extremity Assessment Lower Extremity Assessment: RLE deficits/detail RLE Deficits / Details: ankle WFL; AAROM grossly WFL RLE: Unable to fully assess due to pain       Communication   Communication: No difficulties  Cognition Arousal/Alertness: Awake/alert Behavior During Therapy: WFL for tasks assessed/performed Overall Cognitive Status: Within Functional Limits for tasks assessed  General Comments      Exercises     Assessment/Plan    PT Assessment Patient needs continued PT services  PT Problem List Decreased strength;Decreased mobility;Decreased activity tolerance;Decreased balance;Decreased knowledge of use of DME;Pain       PT Treatment Interventions DME instruction;Gait training;Functional mobility training;Therapeutic activities;Patient/family education;Therapeutic exercise;Stair training    PT Goals (Current goals can be found in the Care Plan section)  Acute Rehab PT Goals PT Goal Formulation: With patient Time For Goal Achievement: 09/16/19 Potential to Achieve Goals:  Good    Frequency 7X/week   Barriers to discharge        Co-evaluation               AM-PAC PT "6 Clicks" Mobility  Outcome Measure Help needed turning from your back to your side while in a flat bed without using bedrails?: A Little Help needed moving from lying on your back to sitting on the side of a flat bed without using bedrails?: A Little Help needed moving to and from a bed to a chair (including a wheelchair)?: A Little Help needed standing up from a chair using your arms (e.g., wheelchair or bedside chair)?: A Little Help needed to walk in hospital room?: A Little Help needed climbing 3-5 steps with a railing? : A Lot 6 Click Score: 17    End of Session Equipment Utilized During Treatment: Gait belt Activity Tolerance: Patient tolerated treatment well Patient left: in chair;with call bell/phone within reach;with chair alarm set   PT Visit Diagnosis: Difficulty in walking, not elsewhere classified (R26.2)    Time: [id number] PT Time Calculation (min) (ACUTE ONLY): 23 min   Charges:   PT Evaluation $PT Eval Low Complexity: 1 Low PT Treatments $Gait Training: 8-22 mins        Baxter Flattery, PT   Acute Rehab Dept [facility]): [id number]   09/09/2019   [facility] 09/09/2019, 2:51 PM

## 2019-09-09 NOTE — Anesthesia Procedure Notes (Signed)
Procedure Name: Intubation Date/Time: 09/09/2019 8:23 AM Performed by: Sharlette Dense, CRNA Patient Re-evaluated:Patient Re-evaluated prior to induction Oxygen Delivery Method: Circle system utilized Preoxygenation: Pre-oxygenation with 100% oxygen Induction Type: IV induction Ventilation: Mask ventilation without difficulty and Oral airway inserted - appropriate to patient size Laryngoscope Size: Sabra Heck and 2 Grade View: Grade I Tube type: Oral Tube size: 7.5 mm Number of attempts: 1 Airway Equipment and Method: Stylet Placement Confirmation: ETT inserted through vocal cords under direct vision,  positive ETCO2 and breath sounds checked- equal and bilateral Secured at: 20 cm Tube secured with: Tape Dental Injury: Teeth and Oropharynx as per pre-operative assessment

## 2019-09-09 NOTE — Care Plan (Signed)
Ortho Bundle Case Management Note  Patient Details  Name: Joan [name] MRN: [id number] Date of Birth: June 30, 1946  R THA on 09-09-19 DCP:  Home with son.  1 story home with 1 ste. DME:  No needs.  Has a RW and elevated toilets.  PT:  HEP                   DME Arranged:  N/A DME Agency:  NA  HH Arranged:  NA HH Agency:  NA  Additional Comments: Please contact me with any questions of if this plan should need to change.  Marianne Sofia, RN,CCM [facility]  [phone] 09/09/2019, 10:14 AM

## 2019-09-09 NOTE — Anesthesia Postprocedure Evaluation (Signed)
Anesthesia Post Note  Patient: Joan [name]  Procedure(s) Performed: TOTAL HIP ARTHROPLASTY ANTERIOR APPROACH (Right Hip)     Patient location during evaluation: PACU Anesthesia Type: General Level of consciousness: awake and alert Pain management: pain level controlled Vital Signs Assessment: post-procedure vital signs reviewed and stable Respiratory status: spontaneous breathing, nonlabored ventilation, respiratory function stable and patient connected to nasal cannula oxygen Cardiovascular status: blood pressure returned to baseline and stable Postop Assessment: no apparent nausea or vomiting Anesthetic complications: no    Last Vitals:  Vitals:   09/09/19 1158 09/09/19 1304  BP: 105/67 101/61  Pulse: 71 75  Resp: 18 16  Temp: 36.4 C (!) 36.4 C  SpO2: 99% 98%    Last Pain:  Vitals:   09/09/19 1304  TempSrc: Oral  PainSc:                  Barnet Glasgow

## 2019-09-09 NOTE — Discharge Instructions (Addendum)
Gaynelle Arabian, MD Total Joint Specialist [facility] [place] [address] [place], [place] [address] [phone]  ANTERIOR APPROACH TOTAL HIP REPLACEMENT POSTOPERATIVE DIRECTIONS     Hip Rehabilitation, Guidelines Following Surgery  The results of a hip operation are greatly improved after range of motion and muscle strengthening exercises. Follow all safety measures which are given to protect your hip. If any of these exercises cause increased pain or swelling in your joint, decrease the amount until you are comfortable again. Then slowly increase the exercises. Call your caregiver if you have problems or questions.   BLOOD CLOT PREVENTION . Take a 325 mg Aspirin two times a day for three weeks following surgery. Then take an 81 mg Aspirin once a day for three weeks. Then discontinue Aspirin. Dennis Bast may resume your vitamins/supplements upon discharge from the hospital. . Do not take any NSAIDs (Advil, Aleve, Ibuprofen, Meloxicam, etc.) until you have discontinued the 325 mg Aspirin.  HOME CARE INSTRUCTIONS  . Remove items at home which could result in a fall. This includes throw rugs or furniture in walking pathways.   ICE to the affected hip as frequently as 20-30 minutes an hour and then as needed for pain and swelling. Continue to use ice on the hip for pain and swelling from surgery. You may notice swelling that will progress down to the foot and ankle. This is normal after surgery. Elevate the leg when you are not up walking on it.    Continue to use the breathing machine which will help keep your temperature down.  It is common for your temperature to cycle up and down following surgery, especially at night when you are not up moving around and exerting yourself.  The breathing machine keeps your lungs expanded and your temperature down.  DIET You may resume your previous home diet once your are discharged from the hospital.  DRESSING / WOUND CARE /  SHOWERING . You may remove the adhesive bandage 2 days following surgery. Leave the tape strips across the incision in place until your first follow-up appointment. Cover the incision with a 4x4 gauze and paper tape. Change the dressing daily with new gauze and paper tape for 7-10 days following surgery. . You may begin showering 3 days following surgery, but do not submerge the incision under water. . Allow water and soap to run over the incision, pat dry, and apply a fresh gauze dressing.  ACTIVITY . For the first 3-5 days, it is important to rest and keep the operative leg elevated. You should, as a general rule, rest for 50 minutes and walk/stretch for 10 minutes per hour. After 5 days, you may slowly increase activity as tolerated.  Marland Kitchen Perform the exercises you were provided twice a day for about 15-20 minutes each session. Begin these 2 days following surgery. . Walk with your walker as instructed. Use the walker until you are comfortable transitioning to a cane. Walk with the cane in the opposite hand of the operative leg. You may discontinue the cane once you are comfortable and walking steadily. . Avoid periods of inactivity such as sitting longer than an hour when not asleep. This helps prevent blood clots.  . Do not drive a car for 6 weeks or until released by your surgeon.  . Do not drive while taking narcotics.  TED HOSE STOCKINGS Wear the elastic stockings on both legs for three weeks following surgery during the day. You may remove them at night while sleeping.  WEIGHT BEARING Weight bearing as tolerated with assist device (walker, cane, etc) as directed, use it as long as suggested by your surgeon or therapist, typically at least 4-6 weeks.  POSTOPERATIVE CONSTIPATION PROTOCOL Constipation - defined medically as fewer than three stools per week and severe constipation as less than one stool per week.  One of the most common issues patients have following surgery is constipation.   Even if you have a regular bowel pattern at home, your normal regimen is likely to be disrupted due to multiple reasons following surgery.  Combination of anesthesia, postoperative narcotics, change in appetite and fluid intake all can affect your bowels.  In order to avoid complications following surgery, here are some recommendations in order to help you during your recovery period.  . Colace (docusate) - Pick up an over-the-counter form of Colace or another stool softener and take twice a day as long as you are requiring postoperative pain medications.  Take with a full glass of water daily.  If you experience loose stools or diarrhea, hold the colace until you stool forms back up.  If your symptoms do not get better within 1 week or if they get worse, check with your doctor. . Dulcolax (bisacodyl) - Pick up over-the-counter and take as directed by the product packaging as needed to assist with the movement of your bowels.  Take with a full glass of water.  Use this product as needed if not relieved by Colace only.  . MiraLax (polyethylene glycol) - Pick up over-the-counter to have on hand.  MiraLax is a solution that will increase the amount of water in your bowels to assist with bowel movements.  Take as directed and can mix with a glass of water, juice, soda, coffee, or tea.  Take if you go more than two days without a movement.Do not use MiraLax more than once per day. Call your doctor if you are still constipated or irregular after using this medication for 7 days in a row.  If you continue to have problems with postoperative constipation, please contact the office for further assistance and recommendations.  If you experience "the worst abdominal pain ever" or develop nausea or vomiting, please contact the office immediatly for further recommendations for treatment.  ITCHING  If you experience itching with your medications, try taking only a single pain pill, or even half a pain pill at a time.   You can also use Benadryl over the counter for itching or also to help with sleep.   MEDICATIONS See your medication summary on the "After Visit Summary" that the nursing staff will review with you prior to discharge.  You may have some home medications which will be placed on hold until you complete the course of blood thinner medication.  It is important for you to complete the blood thinner medication as prescribed by your surgeon.  Continue your approved medications as instructed at time of discharge.  PRECAUTIONS If you experience chest pain or shortness of breath - call 911 immediately for transfer to the hospital emergency department.  If you develop a fever greater that 101 F, purulent drainage from wound, increased redness or drainage from wound, foul odor from the wound/dressing, or calf pain - CONTACT YOUR SURGEON.  FOLLOW-UP APPOINTMENTS Make sure you keep all of your appointments after your operation with your surgeon and caregivers. You should call the office at the above phone number and make an appointment for approximately two weeks after the date of your surgery or on the date instructed by your surgeon outlined in the "After Visit Summary".  RANGE OF MOTION AND STRENGTHENING EXERCISES  These exercises are designed to help you keep full movement of your hip joint. Follow your caregiver's or physical therapist's instructions. Perform all exercises about fifteen times, three times per day or as directed. Exercise both hips, even if you have had only one joint replacement. These exercises can be done on a training (exercise) mat, on the floor, on a table or on a bed. Use whatever works the best and is most comfortable for you. Use music or television while you are exercising so that the exercises are a pleasant break in your day. This will make your life better with the exercises acting as a break in routine you can look forward to.   . Lying on your back, slowly slide your foot toward your buttocks, raising your knee up off the floor. Then slowly slide your foot back down until your leg is straight again.  . Lying on your back spread your legs as far apart as you can without causing discomfort.  . Lying on your side, raise your upper leg and foot straight up from the floor as far as is comfortable. Slowly lower the leg and repeat.  . Lying on your back, tighten up the muscle in the front of your thigh (quadriceps muscles). You can do this by keeping your leg straight and trying to raise your heel off the floor. This helps strengthen the largest muscle supporting your knee.  . Lying on your back, tighten up the muscles of your buttocks both with the legs straight and with the knee bent at a comfortable angle while keeping your heel on the floor.   IF YOU ARE TRANSFERRED TO A SKILLED REHAB FACILITY If the patient is transferred to a skilled rehab facility following release from the hospital, a list of the current medications will be sent to the facility for the patient to continue.  When discharged from the skilled rehab facility, please have the facility set up the patient's [place] prior to being released. Also, the skilled facility will be responsible for providing the patient with their medications at time of release from the facility to include their pain medication, the muscle relaxants, and their blood thinner medication. If the patient is still at the rehab facility at time of the two week follow up appointment, the skilled rehab facility will also need to assist the patient in arranging follow up appointment in our office and any transportation needs.  MAKE SURE YOU:  . Understand these instructions.  . Get help right away if you are not doing well or get worse.    Pick up stool softner and laxative for home use following surgery while on pain medications. Do not submerge incision under water. Please  use good hand washing techniques while changing dressing each day. May shower starting three days after surgery. Please use a clean towel to pat the incision dry following showers. Continue to use ice for pain and swelling after surgery. Do not use any lotions or creams on the incision until instructed by your surgeon.

## 2019-09-09 NOTE — Op Note (Signed)
OPERATIVE REPORT- TOTAL HIP ARTHROPLASTY   PREOPERATIVE DIAGNOSIS: Osteoarthritis of the Right hip.   POSTOPERATIVE DIAGNOSIS: Osteoarthritis of the Right  hip.   PROCEDURE: Right total hip arthroplasty, anterior approach.   SURGEON: Gaynelle Arabian, MD   ASSISTANT: Theresa Duty, PA-C  ANESTHESIA:  General  ESTIMATED BLOOD LOSS:-450 mL    DRAINS: Hemovac x1.   COMPLICATIONS: None   CONDITION: PACU - hemodynamically stable.   BRIEF CLINICAL NOTE: Joan [name] is a 74 y.o. female who has advanced end-  stage arthritis of their Right  hip with progressively worsening pain and  dysfunction.The patient has failed nonoperative management and presents for  total hip arthroplasty.   PROCEDURE IN DETAIL: After successful administration of spinal  anesthetic, the traction boots for the [facility] bed were placed on both  feet and the patient was placed onto the [facility] bed, boots placed into the leg  holders. The Right hip was then isolated from the perineum with plastic  drapes and prepped and draped in the usual sterile fashion. ASIS and  greater trochanter were marked and a oblique incision was made, starting  at about 1 cm lateral and 2 cm distal to the ASIS and coursing towards  the anterior cortex of the femur. The skin was cut with a 10 blade  through subcutaneous tissue to the level of the fascia overlying the  tensor fascia lata muscle. The fascia was then incised in line with the  incision at the junction of the anterior third and posterior 2/3rd. The  muscle was teased off the fascia and then the interval between the TFL  and the rectus was developed. The Hohmann retractor was then placed at  the top of the femoral neck over the capsule. The vessels overlying the  capsule were cauterized and the fat on top of the capsule was removed.  A Hohmann retractor was then placed anterior underneath the rectus  femoris to give exposure to the entire anterior capsule. A T-shaped   capsulotomy was performed. The edges were tagged and the femoral head  was identified.       Osteophytes are removed off the superior acetabulum.  The femoral neck was then cut in situ with an oscillating saw. Traction  was then applied to the left lower extremity utilizing the [facility]  traction. The femoral head was then removed. Retractors were placed  around the acetabulum and then circumferential removal of the labrum was  performed. Osteophytes were also removed. Reaming starts at 45 mm to  medialize and  Increased in 2 mm increments to 47 mm. We reamed in  approximately 40 degrees of abduction, 20 degrees anteversion. A 48 mm  pinnacle acetabular shell was then impacted in anatomic position under  fluoroscopic guidance with excellent purchase. We did not need to place  any additional dome screws. A 28 mm neutral + 4 marathon liner was then  placed into the acetabular shell.       The femoral lift was then placed along the lateral aspect of the femur  just distal to the vastus ridge. The leg was  externally rotated and capsule  was stripped off the inferior aspect of the femoral neck down to the  level of the lesser trochanter, this was done with electrocautery. The femur was lifted after this was performed. The  leg was then placed in an extended and adducted position essentially delivering the femur. We also removed the capsule superiorly and the piriformis from the piriformis fossa to gain excellent  exposure of the  proximal femur. Rongeur was used to remove some cancellous bone to get  into the lateral portion of the proximal femur for placement of the  initial starter reamer. The starter broaches was placed  the starter broach  and was shown to go down the center of the canal. Broaching  with the Corail system was then performed starting at size 8  coursing  Up to size 5. A size 5 had excellent torsional and rotational  and axial stability. The trial standard offset neck was then  placed  with a 28 + 1.5 trial head. The hip was then reduced. We confirmed that  the stem was in the canal both on AP and lateral x-rays. It also has excellent sizing. The hip was reduced with outstanding stability through full extension and full external rotation.. AP pelvis was taken and the leg lengths were measured and found to be equal. Hip was then dislocated again and the femoral head and neck removed. The  femoral broach was removed. Size 5 Corail stem with a standard offset  neck was then impacted into the femur following native anteversion. Has  excellent purchase in the canal. Excellent torsional and rotational and  axial stability. It is confirmed to be in the canal on AP and lateral  fluoroscopic views. The 28 + 1.5 ceramic head was placed and the hip  reduced with outstanding stability. Again AP pelvis was taken and it  confirmed that the leg lengths were equal. The wound was then copiously  irrigated with saline solution and the capsule reattached and repaired  with Ethibond suture. 30 ml of .25% Bupivicaine was  injected into the capsule and into the edge of the tensor fascia lata as well as subcutaneous tissue. The fascia overlying the tensor fascia lata was then closed with a running #1 V-Loc. Subcu was closed with interrupted 2-0 Vicryl and subcuticular running 4-0 Monocryl. Incision was cleaned  and dried. Steri-Strips and a bulky sterile dressing applied. Hemovac  drain was hooked to suction and then the patient was awakened and transported to  recovery in stable condition.        Please note that a surgical assistant was a medical necessity for this procedure to perform it in a safe and expeditious manner. Assistant was necessary to provide appropriate retraction of vital neurovascular structures and to prevent femoral fracture and allow for anatomic placement of the prosthesis.  Gaynelle Arabian, M.D.

## 2019-09-10 ENCOUNTER — Encounter: Payer: Self-pay | Admitting: *Deleted

## 2019-09-10 LAB — BASIC METABOLIC PANEL
Anion gap: 8 (ref 5–15)
BUN: 13 mg/dL (ref 8–23)
CO2: 28 mmol/L (ref 22–32)
Calcium: 8.3 mg/dL — ABNORMAL LOW (ref 8.9–10.3)
Chloride: 100 mmol/L (ref 98–111)
Creatinine, Ser: 0.77 mg/dL (ref 0.44–1.00)
GFR calc Af Amer: >60 mL/min (ref >60)
GFR calc non Af Amer: >60 mL/min (ref >60)
Glucose, Bld: 171 mg/dL — ABNORMAL HIGH (ref 70–99)
Potassium: 3.3 mmol/L — ABNORMAL LOW (ref 3.5–5.1)
Sodium: 136 mmol/L (ref 135–145)

## 2019-09-10 LAB — CBC
HCT: 29.8 % — ABNORMAL LOW (ref 36.0–46.0)
Hemoglobin: 9.8 g/dL — ABNORMAL LOW (ref 12.0–15.0)
MCH: 30 pg (ref 26.0–34.0)
MCHC: 32.9 g/dL (ref 30.0–36.0)
MCV: 91.1 fL (ref 80.0–100.0)
Platelets: 166 10*3/uL (ref 150–400)
RBC: 3.27 MIL/uL — ABNORMAL LOW (ref 3.87–5.11)
RDW: 13.8 % (ref 11.5–15.5)
WBC: 8.4 10*3/uL (ref 4.0–10.5)
nRBC: 0 % (ref 0.0–0.2)

## 2019-09-10 MED ORDER — PREDNISONE 5 MG (21) PO TBPK
5.0000 mg | ORAL_TABLET | ORAL | Status: AC
  Administered 2019-09-10: 5 mg via ORAL

## 2019-09-10 MED ORDER — PREDNISONE 5 MG (21) PO TBPK: 5.0000 mg | ORAL_TABLET | Freq: Four times a day (QID) | ORAL | Status: DC

## 2019-09-10 MED ORDER — PREDNISONE 5 MG (21) PO TBPK: 10.0000 mg | ORAL_TABLET | Freq: Every evening | ORAL | Status: DC

## 2019-09-10 MED ORDER — PROPOFOL 1000 MG/100ML IV EMUL
INTRAVENOUS | Status: AC
  Filled 2019-09-10: qty 100

## 2019-09-10 MED ORDER — PREDNISONE 5 MG (21) PO TBPK
5.0000 mg | ORAL_TABLET | Freq: Three times a day (TID) | ORAL | Status: DC
  Administered 2019-09-11: 5 mg via ORAL

## 2019-09-10 MED ORDER — PREDNISONE 5 MG (21) PO TBPK
10.0000 mg | ORAL_TABLET | Freq: Every evening | ORAL | Status: AC
  Administered 2019-09-10: 10 mg via ORAL

## 2019-09-10 MED ORDER — PREDNISONE 5 MG (21) PO TBPK
10.0000 mg | ORAL_TABLET | Freq: Every morning | ORAL | Status: AC
  Administered 2019-09-10: 10 mg via ORAL
  Filled 2019-09-10: qty 21

## 2019-09-10 NOTE — Progress Notes (Signed)
Physical Therapy Treatment Patient Details Name: Joan [name] MRN: [id number] DOB: 04/10/1946 Today's Date: 09/10/2019    History of Present Illness s/p R THA. PMH: L THA, TKA, multiple back surgeries (see chart for full Hx)    PT Comments    POD # 1 am session Assisted OOB to amb in hallway.  General bed mobility comments: increased time and use of bed pad to complete scooting to EOB.  Increased assist needed back to bed. General transfer comment: cues for hand placement and safety plus increased time.  General Gait Details: increased time with 25% VC's on proper walker to self distance and upright posture.  Distance limited by pain. Pt declined to stay up inrecliner, so assisted back to bed.  Applied ICE to hip and heat to R knee.  Positioned to comfort.    Follow Up Recommendations  Follow surgeon's recommendation for DC plan and follow-up therapies     Equipment Recommendations  None recommended by PT    Recommendations for Other Services       Precautions / Restrictions Precautions Precautions: Fall Restrictions Weight Bearing Restrictions: No RLE Weight Bearing: Weight bearing as tolerated    Mobility  Bed Mobility Overal bed mobility: Needs Assistance Bed Mobility: Supine to Sit;Sit to Supine     Supine to sit: Mod assist Sit to supine: Mod assist;Max assist   General bed mobility comments: increased time and use of bed pad to complete scooting to EOB.  Increased assist needed back to bed.  Transfers Overall transfer level: Needs assistance Equipment used: Rolling walker (2 wheeled) Transfers: Sit to/from Stand Sit to Stand: Min assist;From elevated surface;Mod assist         General transfer comment: cues for hand placement and safety plus increased time  Ambulation/Gait Ambulation/Gait assistance: Min guard Gait Distance (Feet): 35 Feet Assistive device: Rolling walker (2 wheeled) Gait Pattern/deviations: Step-to pattern;Decreased weight shift to  right;Decreased stance time - right Gait velocity: decreased   General Gait Details: increased time with 25% VC's on proper walker to self distance and upright posture.  Distance limited by pain.   Stairs             Wheelchair Mobility    Modified Rankin (Stroke Patients Only)       Balance                                            Cognition Arousal/Alertness: Awake/alert Behavior During Therapy: WFL for tasks assessed/performed Overall Cognitive Status: Within Functional Limits for tasks assessed                                        Exercises      General Comments        Pertinent Vitals/Pain Pain Assessment: 0-10 Pain Score: 6  Pain Location: right hip Pain Descriptors / Indicators: Aching;Grimacing;Constant Pain Intervention(s): Monitored during session;Repositioned;Premedicated before session;Ice applied;Heat applied    Home Living                      Prior Function            PT Goals (current goals can now be found in the care plan section) Progress towards PT goals: Progressing toward goals    Frequency  7X/week      PT Plan Current plan remains appropriate    Co-evaluation              AM-PAC PT "6 Clicks" Mobility   Outcome Measure  Help needed turning from your back to your side while in a flat bed without using bedrails?: A Little Help needed moving from lying on your back to sitting on the side of a flat bed without using bedrails?: A Little Help needed moving to and from a bed to a chair (including a wheelchair)?: A Little Help needed standing up from a chair using your arms (e.g., wheelchair or bedside chair)?: A Little Help needed to walk in hospital room?: A Little Help needed climbing 3-5 steps with a railing? : A Lot 6 Click Score: 17    End of Session Equipment Utilized During Treatment: Gait belt Activity Tolerance: Patient limited by pain Patient left: in  bed;with bed alarm set;with call bell/phone within reach Nurse Communication: Mobility status PT Visit Diagnosis: Difficulty in walking, not elsewhere classified (R26.2)     Time: 1030-1055 PT Time Calculation (min) (ACUTE ONLY): 25 min  Charges:  $Gait Training: 8-22 mins $Therapeutic Activity: 8-22 mins                     {Ruta Capece  PTA Acute  Rehabilitation Services Pager      [phone] Office      [phone]

## 2019-09-10 NOTE — Progress Notes (Signed)
Physical Therapy Treatment Patient Details Name: Joan [name] MRN: [id number] DOB: 05-Oct-1945 Today's Date: 09/10/2019    History of Present Illness s/p R THA. PMH: L THA, TKA, multiple back surgeries (see chart for full Hx)    PT Comments    POD # 1 pm session Assisted OOB to amb a second time in hallway.  Pt c/o "fatigue" and increased pain despite given IV Dilaudid earlier.  Tolerated a slight increased distance and amb to bathroom.  Assisted back to bed per pt request to rest.  Pt plans to D/C to home tomorrow.  Stated her son can stay with her the first 2 days.    Follow Up Recommendations  Follow surgeon's recommendation for DC plan and follow-up therapies     Equipment Recommendations  None recommended by PT    Recommendations for Other Services       Precautions / Restrictions Precautions Precautions: Fall Restrictions Weight Bearing Restrictions: No RLE Weight Bearing: Weight bearing as tolerated    Mobility  Bed Mobility Overal bed mobility: Needs Assistance Bed Mobility: Supine to Sit;Sit to Supine     Supine to sit: Mod assist Sit to supine: Mod assist;Max assist   General bed mobility comments: increased time and use of bed pad to complete scooting to EOB.  Increased assist needed back to bed.  Transfers Overall transfer level: Needs assistance Equipment used: Rolling walker (2 wheeled) Transfers: Sit to/from Stand Sit to Stand: Min assist;From elevated surface;Mod assist         General transfer comment: cues for hand placement and safety plus increased time.  Also assisted with a toilet transfer.  Ambulation/Gait Ambulation/Gait assistance: Min guard Gait Distance (Feet): 38 Feet Assistive device: Rolling walker (2 wheeled) Gait Pattern/deviations: Step-to pattern;Decreased weight shift to right;Decreased stance time - right Gait velocity: decreased   General Gait Details: increased time with 25% VC's on proper walker to self distance and  upright posture.  Distance limited by pain.   Stairs             Wheelchair Mobility    Modified Rankin (Stroke Patients Only)       Balance                                            Cognition Arousal/Alertness: Awake/alert Behavior During Therapy: WFL for tasks assessed/performed Overall Cognitive Status: Within Functional Limits for tasks assessed                                        Exercises      General Comments        Pertinent Vitals/Pain Pain Assessment: 0-10 Pain Score: 6  Pain Location: right hip Pain Descriptors / Indicators: Aching;Grimacing;Constant Pain Intervention(s): Monitored during session;Repositioned;Premedicated before session;Ice applied;Heat applied    Home Living                      Prior Function            PT Goals (current goals can now be found in the care plan section) Progress towards PT goals: Progressing toward goals    Frequency    7X/week      PT Plan Current plan remains appropriate    Co-evaluation  AM-PAC PT "6 Clicks" Mobility   Outcome Measure  Help needed turning from your back to your side while in a flat bed without using bedrails?: A Little Help needed moving from lying on your back to sitting on the side of a flat bed without using bedrails?: A Little Help needed moving to and from a bed to a chair (including a wheelchair)?: A Little Help needed standing up from a chair using your arms (e.g., wheelchair or bedside chair)?: A Little Help needed to walk in hospital room?: A Little Help needed climbing 3-5 steps with a railing? : A Lot 6 Click Score: 17    End of Session Equipment Utilized During Treatment: Gait belt Activity Tolerance: Patient limited by pain Patient left: in bed;with bed alarm set;with call bell/phone within reach Nurse Communication: Mobility status PT Visit Diagnosis: Difficulty in walking, not elsewhere  classified (R26.2)     Time: [id number] PT Time Calculation (min) (ACUTE ONLY): 32 min  Charges:  $Gait Training: 8-22 mins $Therapeutic Activity: 8-22 mins                     {Teri Legacy  PTA Acute  Rehabilitation Services Pager      [phone] Office      [phone]

## 2019-09-10 NOTE — Progress Notes (Signed)
Subjective: 1 Day Post-Op Procedure(s) (LRB): TOTAL HIP ARTHROPLASTY ANTERIOR APPROACH (Right) Patient reports pain as moderate.   Patient seen in rounds with Dr. Wynelle Link. Patient is having problems with pain in the right hip and knee. She reports that she did not get any rest last night because of the pain. She has been icing and palcing heat over the right knee. Denies SOB and chest pain. Good urine output from foley. Catheter to be removed this morning. Positive flatus.  Plan is to go Home after hospital stay.  Objective: Vital signs in last 24 hours: Temp:  [97.5 F (36.4 C)-98.3 F (36.8 C)] 98.1 F (36.7 C) (03/04 0547) Pulse Rate:  [61-97] 97 (03/04 0547) Resp:  [10-18] 16 (03/04 0547) BP: (93-141)/(49-78) 141/69 (03/04 0547) SpO2:  [93 %-100 %] 100 % (03/04 0547)  Intake/Output from previous day:  Intake/Output Summary (Last 24 hours) at 09/10/2019 0726 Last data filed at 09/10/2019 0600 Gross per 24 hour  Intake 4507.33 ml  Output 2120 ml  Net 2387.33 ml     Labs: Recent Labs    09/10/19 0421  HGB 9.8*   Recent Labs    09/10/19 0421  WBC 8.4  RBC 3.27*  HCT 29.8*  PLT 166   Recent Labs    09/10/19 0421  NA 136  K 3.3*  CL 100  CO2 28  BUN 13  CREATININE 0.77  GLUCOSE 171*  CALCIUM 8.3*    EXAM General - Patient is Alert and Oriented Extremity - Neurologically intact Intact pulses distally Dorsiflexion/Plantar flexion intact No cellulitis present Compartment soft Dressing - dressing C/D/I Motor Function - intact, moving foot and toes well on exam.  Hemovac pulled without difficulty.  Past Medical History:  Diagnosis Date  . Arthritis    "shoulders; wrist; probably spine" (06/24/2013)  . Chronic low back pain    followed by Dr Hardin Negus pain mgt  . Colon polyp   . Constipation   . Esophageal stricture   . Finger pain, left    2 fingers on left hand since wrist surgery  . GERD (gastroesophageal reflux disease)   . Heart murmur    "slight; not on RX" (06/24/2013)  . History of cardiac arrhythmia    cardiologist- Traci Turner  . Hx of colonic polyps 08/28/2004  . Hyperlipemia   . Hypertension   . Hypothyroidism   . Osteoarthritis of left knee    advanced  . PONV (postoperative nausea and vomiting)    Pt reports symptoms are the result of gallbladder and cholecystectomy, not anesthesia  . PVC's (premature ventricular contractions)   . Sleep apnea 1990's   "tested; tried mask; couldn't stand it; told me as long as I slept on my side I'd be ok" (06/24/2013)  . Thyroid nodule   . Tobacco abuse     Assessment/Plan: 1 Day Post-Op Procedure(s) (LRB): TOTAL HIP ARTHROPLASTY ANTERIOR APPROACH (Right) Active Problems:   S/P total hip arthroplasty  Estimated body mass index is 33.54 kg/m as calculated from the following:   Height as of this encounter: 5' (1.524 m).   Weight as of this encounter: 77.9 kg. Advance diet Up with therapy D/C IV fluids and saline lock if she is tolerating POs well Plan for discharge tomorrow  DVT Prophylaxis - Aspirin Weight Bearing As Tolerated Hemovac Pulled  Will add prednisone taper for her right leg and knee pain. She can continue to use ice or heat over the knee, whichever is more comfortable. Continue to ice the  right hip. Will have her up with therapy twice today. Potassium 3.3 this morning. Potassium supplementation already scheduled this morning. Having some trouble with muscle spasms but already on Soma so we will not add Flexeril or Robaxin.  Hopeful for DC home tomorrow pending progress and meeting her goals.   Ardeen Jourdain, PA-C Orthopaedic Surgery 09/10/2019, 7:26 AM

## 2019-09-10 NOTE — Plan of Care (Signed)

## 2019-09-10 NOTE — Progress Notes (Signed)
Patient smokes 1 pk of cigarettes per day, asking for nicotine patch. MD on call paged, verbal order given for 14 mg nicotine patch for 24 hr period. Patch placed at 2003, patient stable. Dawson Bills, RN

## 2019-09-11 LAB — BASIC METABOLIC PANEL
Anion gap: 6 (ref 5–15)
BUN: 11 mg/dL (ref 8–23)
CO2: 26 mmol/L (ref 22–32)
Calcium: 8.2 mg/dL — ABNORMAL LOW (ref 8.9–10.3)
Chloride: 103 mmol/L (ref 98–111)
Creatinine, Ser: 0.76 mg/dL (ref 0.44–1.00)
GFR calc Af Amer: >60 mL/min (ref >60)
GFR calc non Af Amer: >60 mL/min (ref >60)
Glucose, Bld: 166 mg/dL — ABNORMAL HIGH (ref 70–99)
Potassium: 4.2 mmol/L (ref 3.5–5.1)
Sodium: 135 mmol/L (ref 135–145)

## 2019-09-11 MED ORDER — ASPIRIN 325 MG PO TBEC: 325.0000 mg | DELAYED_RELEASE_TABLET | Freq: Two times a day (BID) | ORAL | 0 refills | Status: AC

## 2019-09-11 NOTE — TOC Transition Note (Signed)
Transition of Care [facility]) - CM/SW Discharge Note   Patient Details  Name: Joan [name] MRN: [id number] Date of Birth: 20-Nov-1945  Transition of Care [facility]) CM/SW Contact:  Leeroy Cha, RN Phone Number: 09/11/2019, 10:14 AM   Clinical Narrative:    dcd to home with self care   Final next level of care: Home/Self Care Barriers to Discharge: No Barriers Identified   Patient Goals and CMS Choice Patient states their goals for this hospitalization and ongoing recovery are:: to go home      Discharge Placement                       Discharge Plan and Services                DME Arranged: N/A DME Agency: NA       HH Arranged: NA HH Agency: NA        Social Determinants of Health (SDOH) Interventions     Readmission Risk Interventions No flowsheet data found.

## 2019-09-11 NOTE — Progress Notes (Signed)
Physical Therapy Treatment Patient Details Name: Joan [name] MRN: [id number] DOB: August 22, 1945 Today's Date: 09/11/2019    History of Present Illness s/p R THA. PMH: L THA, TKA, multiple back surgeries (see chart for full Hx)    PT Comments    POD # 2 Assisted with amb and practiced one step.  Pt ready for D/C to home.  Son coming at noon to pick.  Follow Up Recommendations  Follow surgeon's recommendation for DC plan and follow-up therapies     Equipment Recommendations  None recommended by PT(has all from prior)    Recommendations for Other Services       Precautions / Restrictions Precautions Precautions: Fall Restrictions Weight Bearing Restrictions: No RLE Weight Bearing: Weight bearing as tolerated    Mobility  Bed Mobility               General bed mobility comments: OOB dressed and in recliner  Transfers Overall transfer level: Needs assistance Equipment used: Rolling walker (2 wheeled) Transfers: Sit to/from Stand Sit to Stand: Supervision         General transfer comment: good safety cognition and use of hands to steady self.  Pt also able to self perform peri care standing after toilet use.  Ambulation/Gait Ambulation/Gait assistance: Supervision Gait Distance (Feet): 46 Feet Assistive device: Rolling walker (2 wheeled) Gait Pattern/deviations: Step-to pattern;Decreased weight shift to right;Decreased stance time - right Gait velocity: decreased   General Gait Details: tolerated an increased distance with good safety with turns.  C/O "stifness" hip but "my knee is better".   Stairs Stairs: Yes Stairs assistance: Supervision;Min guard Stair Management: No rails;Step to pattern;Forwards;With walker Number of Stairs: 1 General stair comments: No VC's needed as pt was able to self recall "up with a good" and "down with the bad".  Pt has one curb step to enter her home.   Wheelchair Mobility    Modified Rankin (Stroke Patients Only)        Balance                                            Cognition Arousal/Alertness: Awake/alert Behavior During Therapy: WFL for tasks assessed/performed Overall Cognitive Status: Within Functional Limits for tasks assessed                                        Exercises      General Comments        Pertinent Vitals/Pain Pain Assessment: 0-10 Pain Score: 7  Pain Location: right hip Pain Descriptors / Indicators: Aching;Grimacing;Constant;Operative site guarding Pain Intervention(s): Monitored during session;Premedicated before session;Repositioned;Ice applied    Home Living                      Prior Function            PT Goals (current goals can now be found in the care plan section) Progress towards PT goals: Progressing toward goals    Frequency    7X/week      PT Plan Current plan remains appropriate    Co-evaluation              AM-PAC PT "6 Clicks" Mobility   Outcome Measure  Help needed turning from your back to your side while in  a flat bed without using bedrails?: None Help needed moving from lying on your back to sitting on the side of a flat bed without using bedrails?: None Help needed moving to and from a bed to a chair (including a wheelchair)?: None Help needed standing up from a chair using your arms (e.g., wheelchair or bedside chair)?: None Help needed to walk in hospital room?: None Help needed climbing 3-5 steps with a railing? : A Little 6 Click Score: 23    End of Session Equipment Utilized During Treatment: Gait belt Activity Tolerance: Patient tolerated treatment well Patient left: in chair;with call bell/phone within reach   PT Visit Diagnosis: Difficulty in walking, not elsewhere classified (R26.2)     Time: 1040-1105 PT Time Calculation (min) (ACUTE ONLY): 25 min  Charges:  $Gait Training: 8-22 mins $Therapeutic Activity: 8-22 mins                     Rica Koyanagi   PTA Acute  Rehabilitation Services Pager      [phone] Office      [phone]

## 2019-09-11 NOTE — Progress Notes (Addendum)
Subjective: 2 Days Post-Op Procedure(s) (LRB): TOTAL HIP ARTHROPLASTY ANTERIOR APPROACH (Right) Patient reports pain as mild.   Patient seen in rounds by Dr. Wynelle Link. Patient is well, and has had no acute complaints or problems. Pain is improved today, did well with therapy yesterday. Denies chest pain or SOB. Voiding without difficulty, positive flatus. No issues overnight.  Plan is to go Home with son after hospital stay.  Objective: Vital signs in last 24 hours: Temp:  [98.2 F (36.8 C)-99.4 F (37.4 C)] 98.7 F (37.1 C) (03/05 0622) Pulse Rate:  [77-88] 82 (03/05 0622) Resp:  [16-18] 18 (03/05 0622) BP: (106-136)/(53-57) 106/57 (03/05 0622) SpO2:  [93 %-97 %] 94 % (03/05 0622)  Intake/Output from previous day:  Intake/Output Summary (Last 24 hours) at 09/11/2019 0759 Last data filed at 09/11/2019 0600 Gross per 24 hour  Intake 1022.83 ml  Output 1350 ml  Net -327.17 ml    Intake/Output this shift: No intake/output data recorded.  Labs: Recent Labs    09/10/19 0421  HGB 9.8*   Recent Labs    09/10/19 0421  WBC 8.4  RBC 3.27*  HCT 29.8*  PLT 166   Recent Labs    09/10/19 0421 09/11/19 0516  NA 136 135  K 3.3* 4.2  CL 100 103  CO2 28 26  BUN 13 11  CREATININE 0.77 0.76  GLUCOSE 171* 166*  CALCIUM 8.3* 8.2*   No results for input(s): LABPT, INR in the last 72 hours.  Exam: General - Patient is Alert and Oriented Extremity - Neurologically intact Neurovascular intact Sensation intact distally Dorsiflexion/Plantar flexion intact Dressing/Incision - clean, dry, no drainage Motor Function - intact, moving foot and toes well on exam.   Past Medical History:  Diagnosis Date  . Arthritis    "shoulders; wrist; probably spine" (06/24/2013)  . Chronic low back pain    followed by Dr Hardin Negus pain mgt  . Colon polyp   . Constipation   . Esophageal stricture   . Finger pain, left    2 fingers on left hand since wrist surgery  . GERD  (gastroesophageal reflux disease)   . Heart murmur    "slight; not on RX" (06/24/2013)  . History of cardiac arrhythmia    cardiologist- Traci Turner  . Hx of colonic polyps 08/28/2004  . Hyperlipemia   . Hypertension   . Hypothyroidism   . Osteoarthritis of left knee    advanced  . PONV (postoperative nausea and vomiting)    Pt reports symptoms are the result of gallbladder and cholecystectomy, not anesthesia  . PVC's (premature ventricular contractions)   . Sleep apnea 1990's   "tested; tried mask; couldn't stand it; told me as long as I slept on my side I'd be ok" (06/24/2013)  . Thyroid nodule   . Tobacco abuse     Assessment/Plan: 2 Days Post-Op Procedure(s) (LRB): TOTAL HIP ARTHROPLASTY ANTERIOR APPROACH (Right) Active Problems:   S/P total hip arthroplasty  Estimated body mass index is 33.54 kg/m as calculated from the following:   Height as of this encounter: 5' (1.524 m).   Weight as of this encounter: 77.9 kg. Up with therapy D/C IV fluids  DVT Prophylaxis - Aspirin Weight-bearing as tolerated  Will continue dose pack upon discharge. Prescription sent through office EMR (not Epic).  Plan for discharge after one session of therapy this AM if meeting her goals with HEP. Follow-up in the office in 2 weeks.   Theresa Duty, PA-C Orthopedic Surgery [phone])  [id number] 09/11/2019, 7:59 AM

## 2019-09-11 NOTE — Progress Notes (Signed)
D/C instructions given to patient. Patient had no questions. NT or writer will wheel patient out once her family comes in

## 2019-09-12 ENCOUNTER — Other Ambulatory Visit: Payer: Self-pay | Admitting: Family Medicine

## 2019-09-12 DIAGNOSIS — E785 Hyperlipidemia, unspecified: Secondary | ICD-10-CM

## 2019-09-14 NOTE — Discharge Summary (Signed)
Physician Discharge Summary   Patient ID: Joan [name] MRN: [id number] DOB/AGE: 1945-08-31 74 y.o.  Admit date: 09/09/2019 Discharge date: 09/11/2019  Primary Diagnosis: Osteoarthritis, right hip   Admission Diagnoses:  Past Medical History:  Diagnosis Date  . Arthritis    "shoulders; wrist; probably spine" (06/24/2013)  . Chronic low back pain    followed by Dr Hardin Negus pain mgt  . Colon polyp   . Constipation   . Esophageal stricture   . Finger pain, left    2 fingers on left hand since wrist surgery  . GERD (gastroesophageal reflux disease)   . Heart murmur    "slight; not on RX" (06/24/2013)  . History of cardiac arrhythmia    cardiologist- Traci Turner  . Hx of colonic polyps 08/28/2004  . Hyperlipemia   . Hypertension   . Hypothyroidism   . Osteoarthritis of left knee    advanced  . PONV (postoperative nausea and vomiting)    Pt reports symptoms are the result of gallbladder and cholecystectomy, not anesthesia  . PVC's (premature ventricular contractions)   . Sleep apnea 1990's   "tested; tried mask; couldn't stand it; told me as long as I slept on my side I'd be ok" (06/24/2013)  . Thyroid nodule   . Tobacco abuse    Discharge Diagnoses:   Active Problems:   S/P total hip arthroplasty  Estimated body mass index is 33.54 kg/m as calculated from the following:   Height as of this encounter: 5' (1.524 m).   Weight as of this encounter: 77.9 kg.  Procedure:  Procedure(s) (LRB): TOTAL HIP ARTHROPLASTY ANTERIOR APPROACH (Right)   Consults: None  HPI: Joan [name] is a 74 y.o. female who has advanced end-stage arthritis of their Right  hip with progressively worsening pain and dysfunction.The patient has failed nonoperative management and presents for total hip arthroplasty.   Laboratory Data: Admission on 09/09/2019, Discharged on 09/11/2019  Component Date Value Ref Range Status  . WBC 09/10/2019 8.4  4.0 - 10.5 K/uL Final  . RBC 09/10/2019 3.27*  3.87 - 5.11 MIL/uL Final  . Hemoglobin 09/10/2019 9.8* 12.0 - 15.0 g/dL Final  . HCT 09/10/2019 29.8* 36.0 - 46.0 % Final  . MCV 09/10/2019 91.1  80.0 - 100.0 fL Final  . MCH 09/10/2019 30.0  26.0 - 34.0 pg Final  . MCHC 09/10/2019 32.9  30.0 - 36.0 g/dL Final  . RDW 09/10/2019 13.8  11.5 - 15.5 % Final  . Platelets 09/10/2019 166  150 - 400 K/uL Final  . nRBC 09/10/2019 0.0  0.0 - 0.2 % Final   Performed at [facility], [place] [address] [place], [place] [address]  . Sodium 09/10/2019 136  135 - 145 mmol/L Final  . Potassium 09/10/2019 3.3* 3.5 - 5.1 mmol/L Final  . Chloride 09/10/2019 100  98 - 111 mmol/L Final  . CO2 09/10/2019 28  22 - 32 mmol/L Final  . Glucose, Bld 09/10/2019 171* 70 - 99 mg/dL Final   Glucose reference range applies only to samples taken after fasting for at least 8 hours.  . BUN 09/10/2019 13  8 - 23 mg/dL Final  . Creatinine, Ser 09/10/2019 0.77  0.44 - 1.00 mg/dL Final  . Calcium 09/10/2019 8.3* 8.9 - 10.3 mg/dL Final  . GFR calc non Af Amer 09/10/2019 >60  >60 mL/min Final  . GFR calc Af Amer 09/10/2019 >60  >60 mL/min Final  . Anion gap 09/10/2019 8  5 - 15 Final  Performed at [facility], [place] [address], [place], [place] [address]  . Sodium 09/11/2019 135  135 - 145 mmol/L Final  . Potassium 09/11/2019 4.2  3.5 - 5.1 mmol/L Final   Comment: DELTA CHECK NOTED NO VISIBLE HEMOLYSIS   . Chloride 09/11/2019 103  98 - 111 mmol/L Final  . CO2 09/11/2019 26  22 - 32 mmol/L Final  . Glucose, Bld 09/11/2019 166* 70 - 99 mg/dL Final   Glucose reference range applies only to samples taken after fasting for at least 8 hours.  . BUN 09/11/2019 11  8 - 23 mg/dL Final  . Creatinine, Ser 09/11/2019 0.76  0.44 - 1.00 mg/dL Final  . Calcium 09/11/2019 8.2* 8.9 - 10.3 mg/dL Final  . GFR calc non Af Amer 09/11/2019 >60  >60 mL/min Final  . GFR calc Af Amer 09/11/2019 >60  >60 mL/min Final  . Anion gap 09/11/2019 6  5 - 15 Final    Performed at [facility], [place] [address], [place], [place] [address]  Hospital Outpatient Visit on 09/05/2019  Component Date Value Ref Range Status  . SARS Coronavirus 2 09/05/2019 NEGATIVE  NEGATIVE Final   Comment: (NOTE) SARS-CoV-2 target nucleic acids are NOT DETECTED. The SARS-CoV-2 RNA is generally detectable in upper and lower respiratory specimens during the acute phase of infection. Negative results do not preclude SARS-CoV-2 infection, do not rule out co-infections with other pathogens, and should not be used as the sole basis for treatment or other patient management decisions. Negative results must be combined with clinical observations, patient history, and epidemiological information. The expected result is Negative. Fact Sheet for Patients: SugarRoll.be Fact Sheet for Healthcare Providers: https://www.woods-mathews.com/ This test is not yet approved or cleared by the [place] FDA and  has been authorized for detection and/or diagnosis of SARS-CoV-2 by FDA under an Emergency Use Authorization (EUA). This EUA will remain  in effect (meaning this test can be used) for the duration of the COVID-19 declaration under Section 56                          4(b)(1) of the Act, 21 U.S.C. section 360bbb-3(b)(1), unless the authorization is terminated or revoked sooner. Performed at [facility], [place] [address], [place], [place] [address]   Hospital Outpatient Visit on 09/03/2019  Component Date Value Ref Range Status  . MRSA, PCR 09/03/2019 NEGATIVE  NEGATIVE Final  . Staphylococcus aureus 09/03/2019 NEGATIVE  NEGATIVE Final   Comment: (NOTE) The Xpert SA Assay (FDA approved for NASAL specimens in patients 45 years of age and older), is one component of a comprehensive surveillance program. It is not intended to diagnose infection nor to guide or monitor treatment. Performed at [facility], [place] [address] [place], [place] [address]   . aPTT 09/03/2019 30  24 - 36 seconds Final   Performed at [facility], [place] [address] [place], [place] [address]  . WBC 09/03/2019 4.4  4.0 - 10.5 K/uL Final  . RBC 09/03/2019 4.46  3.87 - 5.11 MIL/uL Final  . Hemoglobin 09/03/2019 13.3  12.0 - 15.0 g/dL Final  . HCT 09/03/2019 40.3  36.0 - 46.0 % Final  . MCV 09/03/2019 90.4  80.0 - 100.0 fL Final  . MCH 09/03/2019 29.8  26.0 - 34.0 pg Final  . MCHC 09/03/2019 33.0  30.0 - 36.0 g/dL Final  . RDW 09/03/2019 13.8  11.5 -  15.5 % Final  . Platelets 09/03/2019 267  150 - 400 K/uL Final  . nRBC 09/03/2019 0.0  0.0 - 0.2 % Final   Performed at [facility], [place] [address] [place], [place] [address]  . Sodium 09/03/2019 140  135 - 145 mmol/L Final  . Potassium 09/03/2019 3.5  3.5 - 5.1 mmol/L Final  . Chloride 09/03/2019 99  98 - 111 mmol/L Final  . CO2 09/03/2019 32  22 - 32 mmol/L Final  . Glucose, Bld 09/03/2019 113* 70 - 99 mg/dL Final   Glucose reference range applies only to samples taken after fasting for at least 8 hours.  . BUN 09/03/2019 22  8 - 23 mg/dL Final  . Creatinine, Ser 09/03/2019 1.18* 0.44 - 1.00 mg/dL Final  . Calcium 09/03/2019 9.0  8.9 - 10.3 mg/dL Final  . Total Protein 09/03/2019 7.4  6.5 - 8.1 g/dL Final  . Albumin 09/03/2019 4.3  3.5 - 5.0 g/dL Final  . AST 09/03/2019 23  15 - 41 U/L Final  . ALT 09/03/2019 19  0 - 44 U/L Final  . Alkaline Phosphatase 09/03/2019 51  38 - 126 U/L Final  . Total Bilirubin 09/03/2019 0.4  0.3 - 1.2 mg/dL Final  . GFR calc non Af Amer 09/03/2019 45* >60 mL/min Final  . GFR calc Af Amer 09/03/2019 53* >60 mL/min Final  . Anion gap 09/03/2019 9  5 - 15 Final   Performed at [facility], [place] [address] [place], [place] [address]  . Prothrombin Time 09/03/2019 12.9  11.4 - 15.2 seconds Final  . INR 09/03/2019 1.0  0.8 - 1.2 Final   Comment: (NOTE) INR goal varies  based on device and disease states. Performed at [facility], [place] [address], [place], [place] [address]   . ABO/RH(D) 09/03/2019 A POS   Final  . Antibody Screen 09/03/2019 NEG   Final  . Sample Expiration 09/03/2019 09/12/2019,2359   Final  . Extend sample reason 09/03/2019    Final                   Value:NO TRANSFUSIONS OR PREGNANCY IN THE PAST 3 MONTHS Performed at [place] [address], [place], [place] [address]   Office Visit on 08/26/2019  Component Date Value Ref Range Status  . TSH 08/26/2019 8.05* 0.35 - 4.50 uIU/mL Final  . Free T4 08/26/2019 1.49  0.60 - 1.60 ng/dL Final   Comment: Specimens from patients who are undergoing biotin therapy and /or ingesting biotin supplements may contain high levels of biotin.  The higher biotin concentration in these specimens interferes with this Free T4 assay.  Specimens that contain high levels  of biotin may cause false high results for this Free T4 assay.  Please interpret results in light of the total clinical presentation of the patient.    Office Visit on 08/13/2019  Component Date Value Ref Range Status  . Cholesterol 08/13/2019 171  0 - 200 mg/dL Final   ATP III Classification       Desirable:  < 200 mg/dL               Borderline High:  200 - 239 mg/dL          High:  > = 240 mg/dL  . Triglycerides 08/13/2019 104.0  0.0 - 149.0 mg/dL Final   Normal:  <150 mg/dLBorderline High:  150 - 199 mg/dL  . HDL 08/13/2019 62.60  >  39.00 mg/dL Final  . VLDL 08/13/2019 20.8  0.0 - 40.0 mg/dL Final  . LDL Cholesterol 08/13/2019 87  0 - 99 mg/dL Final  . Total CHOL/HDL Ratio 08/13/2019 3   Final                  Men          Women1/2 Average Risk     3.4          3.3Average Risk          5.0          4.42X Average Risk          9.6          7.13X Average Risk          15.0          11.0                      . NonHDL 08/13/2019 108.04   Final   NOTE:  Non-HDL goal should be 30 mg/dL higher than  patient's LDL goal (i.e. LDL goal of < 70 mg/dL, would have non-HDL goal of < 100 mg/dL)  . Sodium 08/13/2019 137  135 - 145 mEq/L Final  . Potassium 08/13/2019 3.9  3.5 - 5.1 mEq/L Final  . Chloride 08/13/2019 98  96 - 112 mEq/L Final  . CO2 08/13/2019 33* 19 - 32 mEq/L Final  . Glucose, Bld 08/13/2019 101* 70 - 99 mg/dL Final  . BUN 08/13/2019 21  6 - 23 mg/dL Final  . Creatinine, Ser 08/13/2019 1.12  0.40 - 1.20 mg/dL Final  . Total Bilirubin 08/13/2019 0.4  0.2 - 1.2 mg/dL Final  . Alkaline Phosphatase 08/13/2019 57  39 - 117 U/L Final  . AST 08/13/2019 18  0 - 37 U/L Final  . ALT 08/13/2019 14  0 - 35 U/L Final  . Total Protein 08/13/2019 6.8  6.0 - 8.3 g/dL Final  . Albumin 08/13/2019 4.4  3.5 - 5.2 g/dL Final  . GFR 08/13/2019 47.46* >60.00 mL/min Final  . Calcium 08/13/2019 9.2  8.4 - 10.5 mg/dL Final  . T3 Uptake 08/13/2019 27  22 - 35 % Final  . T4, Total 08/13/2019 13.2* 5.1 - 11.9 mcg/dL Final  . Free Thyroxine Index 08/13/2019 3.6  1.4 - 3.8 Final  . TSH 08/13/2019 4.66* 0.40 - 4.50 mIU/L Final     X-Rays:DG Pelvis Portable  Result Date: 09/09/2019 CLINICAL DATA:  Post RIGHT total hip replacement EXAM: PORTABLE PELVIS 1-2 VIEWS COMPARISON:  Portable exam [id number] hours compared to 04/27/2019 FINDINGS: BILATERAL hip prostheses identified. No fracture, dislocation or bone destruction. Surgical drain overlies the RIGHT hip. Bones appear demineralized. Prior lumbar and lumbosacral fusions. IMPRESSION: RIGHT hip prosthesis without acute complication. Electronically Signed   By: Lavonia Dana M.D.   On: 09/09/2019 10:51   DG C-Arm 1-60 Min-No Report  Result Date: 09/09/2019 CLINICAL DATA:  Right hip arthroplasty EXAM: OPERATIVE RIGHT HIP (WITH PELVIS IF PERFORMED) AP VIEWS TECHNIQUE: Fluoroscopic spot image(s) were submitted for interpretation post-operatively. COMPARISON:  04/27/2019 FINDINGS: 5 C-arm fluoroscopic images were obtained intraoperatively and submitted for post operative  interpretation. Fluoroscopic images demonstrate subsequent placement of right total hip arthroplasty hardware. Hardware components appear in their expected alignment. No obvious intraoperative complication. 7 seconds of fluoroscopy time was utilized. Please see the performing provider's procedural report for further detail. IMPRESSION: As above. Electronically Signed   By: Davina Poke D.O.  On: 09/09/2019 11:30   DG HIP OPERATIVE UNILAT W OR W/O PELVIS RIGHT  Result Date: 09/09/2019 CLINICAL DATA:  Right hip arthroplasty EXAM: OPERATIVE RIGHT HIP (WITH PELVIS IF PERFORMED) AP VIEWS TECHNIQUE: Fluoroscopic spot image(s) were submitted for interpretation post-operatively. COMPARISON:  04/27/2019 FINDINGS: 5 C-arm fluoroscopic images were obtained intraoperatively and submitted for post operative interpretation. Fluoroscopic images demonstrate subsequent placement of right total hip arthroplasty hardware. Hardware components appear in their expected alignment. No obvious intraoperative complication. 7 seconds of fluoroscopy time was utilized. Please see the performing provider's procedural report for further detail. IMPRESSION: As above. Electronically Signed   By: Davina Poke D.O.   On: 09/09/2019 11:30    EKG: Orders placed or performed in visit on 03/12/19  . EKG 12-Lead     Hospital Course: Joan [name] is a 74 y.o. who was admitted to [facility]. They were brought to the operating room on 09/09/2019 and underwent Procedure(s): [place].  Patient tolerated the procedure well and was later transferred to the recovery room and then to the orthopaedic floor for postoperative care. They were given PO and IV analgesics for pain control following their surgery. They were given 24 hours of postoperative antibiotics of  Anti-infectives (From admission, onward)   Start     Dose/Rate Route Frequency Ordered Stop   09/09/19 1400  ceFAZolin (ANCEF) IVPB  2g/100 mL premix     2 g 200 mL/hr over 30 Minutes Intravenous Every 6 hours 09/09/19 1159 09/09/19 2024   09/09/19 0645  ceFAZolin (ANCEF) IVPB 2g/100 mL premix     2 g 200 mL/hr over 30 Minutes Intravenous On call to O.R. 09/09/19 [id number] 09/09/19 0837     and started on DVT prophylaxis in the form of Aspirin.   PT and OT were ordered for total joint protocol. Discharge planning consulted to help with postop disposition and equipment needs. Patient had a poor night on the evening of surgery. Had difficulty sleeping due to pain, reported pain in the right knee as well as the right hip. Prednisone taper was added due to pain. They started to get up OOB with therapy on POD #0. Hemovac drain was pulled without difficulty on day one. Continued to work with therapy into POD #2. Pt was seen during rounds on day two and was ready to go home pending progress with therapy. Pain was much improved. Dressing was changed and the incision was clean, dry, and intact with no drainage. Pt worked with therapy for one additional session and was meeting their goals. She was discharged to home later that day in stable condition.  Diet: Regular diet Activity: WBAT Follow-up: in 2 weeks Disposition: Home with HEP Discharged Condition: stable   Discharge Instructions    Call MD / Call 911   Complete by: As directed    If you experience chest pain or shortness of breath, CALL 911 and be transported to the hospital emergency room.  If you develope a fever above 101 F, pus (white drainage) or increased drainage or redness at the wound, or calf pain, call your surgeon's office.   Change dressing   Complete by: As directed    Change the dressing daily with sterile 4 x 4 inch gauze dressing and paper tape.   Constipation Prevention   Complete by: As directed    Drink plenty of fluids.  Prune juice may be helpful.  You may use a stool softener, such as Colace (over the counter)  100 mg twice a day.  Use MiraLax (over the  counter) for constipation as needed.   Diet - low sodium heart healthy   Complete by: As directed    Do not sit on low chairs, stoools or toilet seats, as it may be difficult to get up from low surfaces   Complete by: As directed    Driving restrictions   Complete by: As directed    No driving for two weeks   TED hose   Complete by: As directed    Use stockings (TED hose) for three weeks on both leg(s).  You may remove them at night for sleeping.   Weight bearing as tolerated   Complete by: As directed      Allergies as of 09/11/2019      Reactions   Morphine And Related Swelling   5-alpha Reductase Inhibitors    Amitriptyline Swelling   Leg swelling   Gabapentin Itching   Triamterene Itching, Rash      Medication List    TAKE these medications   aspirin 325 MG EC tablet Take 1 tablet (325 mg total) by mouth 2 (two) times daily for 19 days. Then take one 81 mg aspirin once a day for three weeks. Then discontinue aspirin. What changed:   when to take this  additional instructions   bisacodyl 5 MG EC tablet Commonly known as: DULCOLAX Take 5-10 mg by mouth daily as needed for mild constipation.   carisoprodol 350 MG tablet Commonly known as: SOMA Take 175 mg by mouth 3 (three) times daily.   dorzolamide-timolol 22.3-6.8 MG/ML ophthalmic solution Commonly known as: COSOPT Place 1 drop into the left eye 2 (two) times daily.   estradiol 0.5 MG tablet Commonly known as: ESTRACE Take 0.5 mg by mouth at bedtime.   fenofibrate 160 MG tablet Take 1 tablet by mouth once daily   furosemide 40 MG tablet Commonly known as: LASIX Take 1 tablet by mouth once daily   hydrochlorothiazide 25 MG tablet Commonly known as: HYDRODIURIL TAKE 1 TABLET BY MOUTH  DAILY   levothyroxine 88 MCG tablet Commonly known as: SYNTHROID Take 1 tablet (88 mcg total) by mouth as directed. 2 tablets on Sundays, 1 tablet Monday through Saturday   oxyCODONE-acetaminophen 10-325 MG tablet  Commonly known as: PERCOCET Take 1 tablet by mouth every 4 (four) hours as needed for pain.   potassium chloride SA 20 MEQ tablet Commonly known as: KLOR-CON Take 3 tablets (60 mEq total) by mouth daily.   simvastatin 20 MG tablet Commonly known as: ZOCOR TAKE 1 TABLET BY MOUTH AT  BEDTIME   traZODone 50 MG tablet Commonly known as: DESYREL TAKE 1 TABLET BY MOUTH AT BEDTIME            Discharge Care Instructions  (From admission, onward)         Start     Ordered   09/11/19 0000  Weight bearing as tolerated     03 /05/21 0805   09/11/19 0000  Change dressing    Comments: Change the dressing daily with sterile 4 x 4 inch gauze dressing and paper tape.   09/11/19 0805         Follow-up Information    Gaynelle Arabian, MD. Go on 09/22/2019.   Specialty: Orthopedic Surgery Why: You are scheduled for a post-operative appointment on 09-22-19 at 5:00 pm. Contact information: [address] [place] [address] [phone]           Signed:  Theresa Duty, PA-C Orthopedic Surgery 09/14/2019, 7:27 AM

## 2019-10-08 NOTE — Progress Notes (Signed)
Nurse connected with patient 10/12/19 at  1:45 PM EDT by a telephone enabled telemedicine application and verified that I am speaking with the correct person using two identifiers. Patient stated full name and DOB. Patient gave permission to continue with virtual visit. Patient's location was at home and Nurse's location was at [place] office.   Subjective:   Joan [name] is a 74 y.o. female who presents for Medicare Annual (Subsequent) preventive examination.  Review of Systems:  Home Safety/Smoke Alarms: Feels safe in home. Smoke alarms in place.  Lives alone in 1 story home.   Female:      Mammo-  Scheduled 10/23/19     Dexa scan-  Declines at this time.      CCS-03/28/15. Recall 5 yrs.    Objective:     Vitals: Unable to assess. This visit is enabled though telemedicine due to Covid 19.   Advanced Directives 10/12/2019 09/09/2019 09/09/2019 09/02/2019 04/27/2019 04/27/2019 04/23/2019  Does Patient Have a Medical Advance Directive? No No No No No No No  Would patient like information on creating a medical advance directive? No - Patient declined No - Patient declined No - Patient declined No - Patient declined No - Patient declined No - Patient declined No - Patient declined  Pre-existing out of facility DNR order (yellow form or pink MOST form) - - - - - - -    Tobacco Social History   Tobacco Use  Smoking Status Current Every Day Smoker  . Packs/day: 1.50  . Years: 50.00  . Pack years: 75.00  . Types: Cigarettes  Smokeless Tobacco Never Used     Ready to quit: No Counseling given: No   Clinical Intake:     Pain : No/denies pain                 Past Medical History:  Diagnosis Date  . Arthritis    "shoulders; wrist; probably spine" (06/24/2013)  . Chronic low back pain    followed by Dr Hardin Negus pain mgt  . Colon polyp   . Constipation   . Esophageal stricture   . Finger pain, left    2 fingers on left hand since wrist surgery  . GERD  (gastroesophageal reflux disease)   . Heart murmur    "slight; not on RX" (06/24/2013)  . History of cardiac arrhythmia    cardiologist- Traci Turner  . Hx of colonic polyps 08/28/2004  . Hyperlipemia   . Hypertension   . Hypothyroidism   . Osteoarthritis of left knee    advanced  . PONV (postoperative nausea and vomiting)    Pt reports symptoms are the result of gallbladder and cholecystectomy, not anesthesia  . PVC's (premature ventricular contractions)   . Sleep apnea 1990's   "tested; tried mask; couldn't stand it; told me as long as I slept on my side I'd be ok" (06/24/2013)  . Thyroid nodule   . Tobacco abuse    Past Surgical History:  Procedure Laterality Date  . ABDOMINAL HYSTERECTOMY  1988   "partial" (06/24/2013)  . ABDOMINAL HYSTERECTOMY     partial in 1988  . ANKLE SURGERY Left 1995   "tendon repair" (06/24/2013)  . BACK SURGERY     "think today was my 8th back OR" (06/24/2013)  . CERVICAL SPINE SURGERY  2012  . [place]  . CHOLECYSTECTOMY N/A 04/16/2013   Procedure: LAPAROSCOPIC CHOLECYSTECTOMY ;  Surgeon: Imogene Burn. Georgette Dover, MD;  Location: [facility];  Service: General;  Laterality: N/A;  . ELBOW SURGERY  1990's  . FOOT SURGERY Right 2012   SPUR REMOVED  . INFUSION PUMP IMPLANTATION  1990's   "implantablet morphine pump; took it out w/in 11 months  . KNEE ARTHROSCOPY Left 1991; ~ 1993  . LAPAROSCOPIC LYSIS OF ADHESIONS N/A 04/16/2013   Procedure: LAPAROSCOPIC LYSIS OF ADHESIONS;  Surgeon: Imogene Burn. Georgette Dover, MD;  Location: [facility];  Service: General;  Laterality: N/A;  . LUMBAR FUSION     and rods  . LUMBAR LAMINECTOMY/DECOMPRESSION MICRODISCECTOMY N/A 11/28/2012   Procedure: DECOMPRESSIVE LUMBAR LAMINECTOMY LEVEL 1;  Surgeon: Elaina Hoops, MD;  Location: [facility];  Service: Neurosurgery;  Laterality: N/A;  DECOMPRESSIVE LUMBAR LAMINECTOMY LEVEL 1  . ORIF DISTAL RADIUS FRACTURE Left 12/30/2013   dr Caralyn Guile  . ORIF WRIST FRACTURE Left 12/30/2013    Procedure: OPEN REDUCTION INTERNAL FIXATION (ORIF) LEFT WRIST FRACTURE AND REPAIR AS INDICATED;  Surgeon: Linna Hoff, MD;  Location: [facility];  Service: Orthopedics;  Laterality: Left;  . POSTERIOR FUSION LUMBAR SPINE  06/24/2013  . ROBOTIC ASSISTED SALPINGO OOPHERECTOMY Bilateral 08/20/2014   Procedure: ROBOTIC ASSISTED SALPINGO OOPHORECTOMY;  Surgeon: Daria Pastures, MD;  Location: [facility];  Service: Gynecology;  Laterality: Bilateral;  . SHOULDER ARTHROSCOPY Left 09/2011  . THYROIDECTOMY  2012  . TOTAL HIP ARTHROPLASTY Left 04/27/2019   Procedure: TOTAL HIP ARTHROPLASTY ANTERIOR APPROACH;  Surgeon: Gaynelle Arabian, MD;  Location: [facility];  Service: Orthopedics;  Laterality: Left;  116min  . TOTAL HIP ARTHROPLASTY Right 09/09/2019   Procedure: TOTAL HIP ARTHROPLASTY ANTERIOR APPROACH;  Surgeon: Gaynelle Arabian, MD;  Location: [facility];  Service: Orthopedics;  Laterality: Right;  172min  . TOTAL KNEE ARTHROPLASTY Right 09/09/2017   Procedure: RIGHT TOTAL KNEE ARTHROPLASTY;  Surgeon: Gaynelle Arabian, MD;  Location: [facility];  Service: Orthopedics;  Laterality: Right;   Family History  Problem Relation Age of Onset  . Pancreatic cancer Father   . Cancer Father   . Diabetes type II Mother   . Hypertension Mother   . Coronary artery disease Mother 33  . Diabetes Mother   . Thyroid cancer Mother   . Skin cancer Mother   . Diabetes Sister   . Hypertension Sister   . Bone cancer Sister   . Breast cancer Sister   . Hypertension Brother   . Breast cancer Maternal Aunt   . Other Neg Hx        No family history of  colon cancer   Social History   Socioeconomic History  . Marital status: Divorced    Spouse name: Not on file  . Number of children: Not on file  . Years of education: Not on file  . Highest education level: Not on file  Occupational History  . Not on file  Tobacco Use  . Smoking status: Current Every Day Smoker    Packs/day: 1.50    Years: 50.00    Pack years: 75.00    Types:  Cigarettes  . Smokeless tobacco: Never Used  Substance and Sexual Activity  . Alcohol use: No    Alcohol/week: 0.0 standard drinks  . Drug use: No  . Sexual activity: Not Currently  Other Topics Concern  . Not on file  Social History Narrative   Divorced   Current Smoker  1 ppd -  20 yrs      Alcohol use-no       [employer] - laid off       Physician  roster:   Dr. Elta Guadeloupe Philips - pain management   Dr. Philis Pique - GYN   Dr. Saintclair Halsted - Neurosurgery   Dr. Ronnald Ramp - dermatology   Dr. Caralyn Guile - orthopedics   Dr. Fransico Him - cardiology   Dr. Miller-ophthalmology   Social Determinants of Health   Financial Resource Strain:   . Difficulty of Paying Living Expenses:   Food Insecurity:   . Worried About Charity fundraiser in the Last Year:   . Arboriculturist in the Last Year:   Transportation Needs:   . Film/video editor (Medical):   Marland Kitchen Lack of Transportation (Non-Medical):   Physical Activity:   . Days of Exercise per Week:   . Minutes of Exercise per Session:   Stress:   . Feeling of Stress :   Social Connections:   . Frequency of Communication with Friends and Family:   . Frequency of Social Gatherings with Friends and Family:   . Attends Religious Services:   . Active Member of Clubs or Organizations:   . Attends Archivist Meetings:   Marland Kitchen Marital Status:     Outpatient Encounter Medications as of 10/12/2019  Medication Sig  . bisacodyl (DULCOLAX) 5 MG EC tablet Take 5-10 mg by mouth daily as needed for mild constipation.   . carisoprodol (SOMA) 350 MG tablet Take 175 mg by mouth 3 (three) times daily.   . dorzolamide-timolol (COSOPT) 22.3-6.8 MG/ML ophthalmic solution Place 1 drop into the left eye 2 (two) times daily.  Marland Kitchen estradiol (ESTRACE) 0.5 MG tablet Take 0.5 mg by mouth at bedtime.   . fenofibrate 160 MG tablet Take 1 tablet by mouth once daily  . furosemide (LASIX) 40 MG tablet Take 1 tablet by mouth once daily (Patient taking  differently: Take 40 mg by mouth daily. )  . hydrochlorothiazide (HYDRODIURIL) 25 MG tablet TAKE 1 TABLET BY MOUTH  DAILY (Patient taking differently: Take 25 mg by mouth daily. )  . levothyroxine (SYNTHROID) 88 MCG tablet Take 1 tablet (88 mcg total) by mouth as directed. 2 tablets on Sundays, 1 tablet Monday through Saturday  . oxyCODONE-acetaminophen (PERCOCET) 10-325 MG tablet Take 1 tablet by mouth every 4 (four) hours as needed for pain.   . potassium chloride SA (KLOR-CON) 20 MEQ tablet Take 3 tablets (60 mEq total) by mouth daily.  . simvastatin (ZOCOR) 20 MG tablet TAKE 1 TABLET BY MOUTH AT  BEDTIME (Patient taking differently: Take 20 mg by mouth at bedtime. )  . traZODone (DESYREL) 50 MG tablet TAKE 1 TABLET BY MOUTH AT BEDTIME (Patient taking differently: Take 50 mg by mouth at bedtime. )   No facility-administered encounter medications on file as of 10/12/2019.    Activities of Daily Living In your present state of health, do you have any difficulty performing the following activities: 10/12/2019 09/09/2019  Hearing? N N  Vision? N N  Difficulty concentrating or making decisions? N N  Walking or climbing stairs? N Y  Dressing or bathing? N N  Doing errands, shopping? N N  Preparing Food and eating ? N -  Using the Toilet? N -  In the past six months, have you accidently leaked urine? N -  Do you have problems with loss of bowel control? N -  Managing your Medications? N -  Managing your Finances? N -  Housekeeping or managing your Housekeeping? N -  Some recent data might be hidden    Patient Care Team: Carollee Herter, Alferd Apa,  DO as PCP - General (Family Medicine) Sueanne Margarita, MD as PCP - Cardiology (Cardiology) Nicholaus Bloom, MD as Consulting Physician (Anesthesiology) Kary Kos, MD as Consulting Physician (Neurosurgery) Iran Planas, MD as Consulting Physician (Orthopedic Surgery) Justice Britain, MD as Consulting Physician (Orthopedic Surgery) Jarome Matin, MD as  Consulting Physician (Dermatology) Bobbye Charleston, MD as Consulting Physician (Obstetrics and Gynecology) Barbaraann Cao, OD as Referring Physician (Optometry) Gatha Mayer, MD as Consulting Physician (Gastroenterology) Gaynelle Arabian, MD as Consulting Physician (Orthopedic Surgery) Gaynelle Arabian, MD as Consulting Physician (Orthopedic Surgery)    Assessment:   This is a routine wellness examination for [place]. Physical assessment deferred to PCP.  Exercise Activities and Dietary recommendations Current Exercise Habits: The patient does not participate in regular exercise at present, Exercise limited by: None identified   Diet (meal preparation, eat out, water intake, caffeinated beverages, dairy products, fruits and vegetables): in general, an "unhealthy" diet Education provided.      Goals    . Increase physical activity     Start back doing the exercises for back.        Fall Risk Fall Risk  10/12/2019 07/01/2017 05/22/2016 09/28/2015 09/02/2015  Falls in the past year? 0 No Yes No No  Number falls in past yr: 0 - 1 - -  Injury with Fall? 0 - Yes - -  Risk Factor Category  - - High Fall Risk - -  Risk for fall due to : - - Impaired balance/gait Impaired mobility;Impaired balance/gait -  Follow up Education provided;Falls prevention discussed - Falls prevention discussed - -   Depression Screen PHQ 2/9 Scores 10/12/2019 07/01/2017 05/22/2016 09/28/2015  PHQ - 2 Score 0 0 0 3  PHQ- 9 Score - - - 8  Exception Documentation - - Patient refusal -     Cognitive Function Ad8 score reviewed for issues:  Issues making decisions:no  Less interest in hobbies / activities:no  Repeats questions, stories (family complaining):no  Trouble using ordinary gadgets (microwave, computer, phone):no  Forgets the month or year: no  Mismanaging finances: no  Remembering appts:no  Daily problems with thinking and/or memory:no Ad8 score is=0     MMSE - Mini Mental State  Exam 05/22/2016 09/28/2015  Orientation to time 5 5  Orientation to Place 5 5  Registration 3 3  Attention/ Calculation 5 5  Recall 3 3  Language- name 2 objects 2 2  Language- repeat 1 1  Language- follow 3 step command 3 3  Language- read & follow direction 1 1  Write a sentence 1 1  Copy design 1 1  Total score 30 30        Immunization History  Administered Date(s) Administered  . Fluad Quad(high Dose 65+) 04/03/2019  . Influenza Split 03/28/2012  . Influenza Whole 04/28/2007, 05/25/2008, 04/19/2009, 04/18/2010  . Influenza, High Dose Seasonal PF 04/13/2014, 04/04/2015, 04/30/2016, 03/14/2017  . Influenza,inj,Quad PF,6+ Mos 03/06/2013  . Influenza-Unspecified 04/08/2018  . Pneumococcal Conjugate-13 06/07/2014  . Pneumococcal Polysaccharide-23 08/12/2007, 10/18/2015  . Td 10/23/2004, 09/08/2014  . Zoster 03/28/2012   Screening Tests Health Maintenance  Topic Date Due  . MAMMOGRAM  04/26/2019  . INFLUENZA VACCINE  02/07/2020  . COLONOSCOPY  03/27/2020  . TETANUS/TDAP  09/07/2024  . DEXA SCAN  Completed  . Hepatitis C Screening  Completed  . PNA vac Low Risk Adult  Completed       Plan:    Please schedule your next medicare wellness visit with me in 1 yr.  Continue to eat heart healthy diet (full of fruits, vegetables, whole grains, lean protein, water--limit salt, fat, and sugar intake) and increase physical activity as tolerated.  Continue doing brain stimulating activities (puzzles, reading, adult coloring books, staying active) to keep memory sharp.     I have personally reviewed and noted the following in the patient's chart:   . Medical and social history . Use of alcohol, tobacco or illicit drugs  . Current medications and supplements . Functional ability and status . Nutritional status . Physical activity . Advanced directives . List of other physicians . Hospitalizations, surgeries, and ER visits in previous 12 months . Vitals . Screenings to  include cognitive, depression, and falls . Referrals and appointments  In addition, I have reviewed and discussed with patient certain preventive protocols, quality metrics, and best practice recommendations. A written personalized care plan for preventive services as well as general preventive health recommendations were provided to patient.     Shela Nevin, [place]  10/12/2019

## 2019-10-12 ENCOUNTER — Ambulatory Visit (INDEPENDENT_AMBULATORY_CARE_PROVIDER_SITE_OTHER): Payer: Medicare Other | Admitting: *Deleted

## 2019-10-12 ENCOUNTER — Other Ambulatory Visit: Payer: Self-pay

## 2019-10-12 ENCOUNTER — Encounter: Payer: Self-pay | Admitting: *Deleted

## 2019-10-12 DIAGNOSIS — Z0000 Encounter for general adult medical examination without abnormal findings: Secondary | ICD-10-CM | POA: Diagnosis not present

## 2019-10-12 NOTE — Patient Instructions (Signed)
Please schedule your next medicare wellness visit with me in 1 yr.  Continue to eat heart healthy diet (full of fruits, vegetables, whole grains, lean protein, water--limit salt, fat, and sugar intake) and increase physical activity as tolerated.  Continue doing brain stimulating activities (puzzles, reading, adult coloring books, staying active) to keep memory sharp.    Joan [name] , Thank you for taking time to come for your Medicare Wellness Visit. I appreciate your ongoing commitment to your health goals. Please review the following plan we discussed and let me know if I can assist you in the future.   These are the goals we discussed: Goals    . Increase physical activity     Start back doing the exercises for back.        This is a list of the screening recommended for you and due dates:  Health Maintenance  Topic Date Due  . Mammogram  04/26/2019  . Flu Shot  02/07/2020  . Colon Cancer Screening  03/27/2020  . Tetanus Vaccine  09/07/2024  . DEXA scan (bone density measurement)  Completed  .  Hepatitis C: One time screening is recommended by Center for Disease Control  (CDC) for  adults born from [birth year] through 1966.   Completed  . Pneumonia vaccines  Completed    Preventive Care 36 Years and Older, Female Preventive care refers to lifestyle choices and visits with your health care provider that can promote health and wellness. This includes:  A yearly physical exam. This is also called an annual well check.  Regular dental and eye exams.  Immunizations.  Screening for certain conditions.  Healthy lifestyle choices, such as diet and exercise. What can I expect for my preventive care visit? Physical exam Your health care provider will check:  Height and weight. These may be used to calculate body mass index (BMI), which is a measurement that tells if you are at a healthy weight.  Heart rate and blood pressure.  Your skin for abnormal spots. Counseling Your  health care provider may ask you questions about:  Alcohol, tobacco, and drug use.  Emotional well-being.  Home and relationship well-being.  Sexual activity.  Eating habits.  History of falls.  Memory and ability to understand (cognition).  Work and work Statistician.  Pregnancy and menstrual history. What immunizations do I need?  Influenza (flu) vaccine  This is recommended every year. Tetanus, diphtheria, and pertussis (Tdap) vaccine  You may need a Td booster every 10 years. Varicella (chickenpox) vaccine  You may need this vaccine if you have not already been vaccinated. Zoster (shingles) vaccine  You may need this after age 59. Pneumococcal conjugate (PCV13) vaccine  One dose is recommended after age 55. Pneumococcal polysaccharide (PPSV23) vaccine  One dose is recommended after age 52. Measles, mumps, and rubella (MMR) vaccine  You may need at least one dose of MMR if you were born in 1957 or later. You may also need a second dose. Meningococcal conjugate (MenACWY) vaccine  You may need this if you have certain conditions. Hepatitis A vaccine  You may need this if you have certain conditions or if you travel or work in places where you may be exposed to hepatitis A. Hepatitis B vaccine  You may need this if you have certain conditions or if you travel or work in places where you may be exposed to hepatitis B. Haemophilus influenzae type b (Hib) vaccine  You may need this if you have certain conditions. You may  receive vaccines as individual doses or as more than one vaccine together in one shot (combination vaccines). Talk with your health care provider about the risks and benefits of combination vaccines. What tests do I need? Blood tests  Lipid and cholesterol levels. These may be checked every 5 years, or more frequently depending on your overall health.  Hepatitis C test.  Hepatitis B test. Screening  Lung cancer screening. You may have this  screening every year starting at age 38 if you have a 30-pack-year history of smoking and currently smoke or have quit within the past 15 years.  Colorectal cancer screening. All adults should have this screening starting at age 9 and continuing until age 24. Your health care provider may recommend screening at age 18 if you are at increased risk. You will have tests every 1-10 years, depending on your results and the type of screening test.  Diabetes screening. This is done by checking your blood sugar (glucose) after you have not eaten for a while (fasting). You may have this done every 1-3 years.  Mammogram. This may be done every 1-2 years. Talk with your health care provider about how often you should have regular mammograms.  BRCA-related cancer screening. This may be done if you have a family history of breast, ovarian, tubal, or peritoneal cancers. Other tests  Sexually transmitted disease (STD) testing.  Bone density scan. This is done to screen for osteoporosis. You may have this done starting at age 41. Follow these instructions at home: Eating and drinking  Eat a diet that includes fresh fruits and vegetables, whole grains, lean protein, and low-fat dairy products. Limit your intake of foods with high amounts of sugar, saturated fats, and salt.  Take vitamin and mineral supplements as recommended by your health care provider.  Do not drink alcohol if your health care provider tells you not to drink.  If you drink alcohol: ? Limit how much you have to 0-1 drink a day. ? Be aware of how much alcohol is in your drink. In the [place] one drink equals one 12 oz bottle of beer (355 mL), one 5 oz glass of wine (148 mL), or one 1 oz glass of hard liquor (44 mL). Lifestyle  Take daily care of your teeth and gums.  Stay active. Exercise for at least 30 minutes on 5 or more days each week.  Do not use any products that contain nicotine or tobacco, such as cigarettes, e-cigarettes,  and chewing tobacco. If you need help quitting, ask your health care provider.  If you are sexually active, practice safe sex. Use a condom or other form of protection in order to prevent STIs (sexually transmitted infections).  Talk with your health care provider about taking a low-dose aspirin or statin. What's next?  Go to your health care provider once a year for a well check visit.  Ask your health care provider how often you should have your eyes and teeth checked.  Stay up to date on all vaccines. This information is not intended to replace advice given to you by your health care provider. Make sure you discuss any questions you have with your health care provider. Document Revised: 06/19/2018 Document Reviewed: 06/19/2018 Elsevier Patient Education  2020 [employer].

## 2019-10-13 DIAGNOSIS — Z471 Aftercare following joint replacement surgery: Secondary | ICD-10-CM | POA: Diagnosis not present

## 2019-10-13 DIAGNOSIS — Z96641 Presence of right artificial hip joint: Secondary | ICD-10-CM | POA: Diagnosis not present

## 2019-10-21 ENCOUNTER — Other Ambulatory Visit (INDEPENDENT_AMBULATORY_CARE_PROVIDER_SITE_OTHER): Payer: Medicare Other

## 2019-10-21 ENCOUNTER — Other Ambulatory Visit: Payer: Self-pay

## 2019-10-21 DIAGNOSIS — E890 Postprocedural hypothyroidism: Secondary | ICD-10-CM | POA: Diagnosis not present

## 2019-10-21 LAB — T4, FREE: Free T4: 1.78 ng/dL — ABNORMAL HIGH (ref 0.60–1.60)

## 2019-10-21 LAB — TSH: TSH: 1.04 u[IU]/mL (ref 0.35–4.50)

## 2019-10-23 ENCOUNTER — Other Ambulatory Visit: Payer: Self-pay

## 2019-10-23 ENCOUNTER — Ambulatory Visit
Admission: RE | Admit: 2019-10-23 | Discharge: 2019-10-23 | Disposition: A | Discharge status: Home / Self Care | Payer: Medicare Other | Source: Ambulatory Visit | Attending: Family Medicine | Admitting: Family Medicine

## 2019-10-23 DIAGNOSIS — Z1231 Encounter for screening mammogram for malignant neoplasm of breast: Secondary | ICD-10-CM

## 2019-10-25 ENCOUNTER — Encounter: Payer: Self-pay | Admitting: Internal Medicine

## 2019-10-27 ENCOUNTER — Other Ambulatory Visit: Payer: Self-pay | Admitting: Family Medicine

## 2019-10-27 DIAGNOSIS — G4700 Insomnia, unspecified: Secondary | ICD-10-CM

## 2019-10-28 NOTE — Progress Notes (Signed)
Cardiology Office Note:   Date:  10/29/2019  NAME:  Joan [name]    MRN: [id number] DOB:  [birth year]   PCP:  [name], DO  Cardiologist:  Fransico Him, MD   Referring MD: Carollee Herter, Alferd Apa, *   Chief Complaint  Patient presents with  . Follow-up   History of Present Illness:   Joan [name] is a 74 y.o. female with a hx of HTN, tobacco abuse, venous insufficiency who presents for follow-up. Evaluated in September prior to hip replacement of LE edema and SOB. BNP normal Echo on my review, 50-55% without WMA.   She reports she is had both hips replaced.  She still not that active and has quite a bit of pain in her right knee.  Apparently her right knee had to be replaced and then had to have a revision surgery done shortly after.  This was in 2018.  She does report still worsening lower extremity edema.  Appears to be improved with Lasix as well as leg elevation.  I did review her recent testing which showed a normal BNP as well as echocardiogram which was normal on my review.  She reports that she is also chronically fatigued.  Energy has not returned after her surgery.  Review of recent laboratory data shows she is anemic with a hemoglobin of 9.8.  In February her hemoglobin was around 13.  I am a little concerned she is anemic to explain her symptoms of fatigue.  Blood pressure well controlled today.  EKG unremarkable.  I also reviewed her recent lipid profile which shows a total cholesterol 171, HDL 62, LDL 87, triglycerides 107.  She remains on fenofibrate which I see is unnecessary.  I think she can stop this for now.  She denies any chest pain or shortness of breath.  She just reports fatigue is her major issue.  She is not doing any structured exercise mainly sitting around the house.  Hopefully this will change in the summer months.  Problem List 1. HTN 2. Tobacco abuse -50 pack year history  3. Venous insufficiency  -negative DVT study 03/2019 -BNP 13 4.   Hyperlipidemia -Total cholesterol 171, HDL 62, LDL 87, triglycerides 104  Past Medical History: Past Medical History:  Diagnosis Date  . Arthritis    "shoulders; wrist; probably spine" (06/24/2013)  . Chronic low back pain    followed by Dr Hardin Negus pain mgt  . Colon polyp   . Constipation   . Esophageal stricture   . Finger pain, left    2 fingers on left hand since wrist surgery  . GERD (gastroesophageal reflux disease)   . Heart murmur    "slight; not on RX" (06/24/2013)  . History of cardiac arrhythmia    cardiologist- Traci Turner  . Hx of colonic polyps 08/28/2004  . Hyperlipemia   . Hypertension   . Hypothyroidism   . Osteoarthritis of left knee    advanced  . PONV (postoperative nausea and vomiting)    Pt reports symptoms are the result of gallbladder and cholecystectomy, not anesthesia  . PVC's (premature ventricular contractions)   . Sleep apnea 1990's   "tested; tried mask; couldn't stand it; told me as long as I slept on my side I'd be ok" (06/24/2013)  . Thyroid nodule   . Tobacco abuse     Past Surgical History: Past Surgical History:  Procedure Laterality Date  . ABDOMINAL HYSTERECTOMY  1988   "partial" (06/24/2013)  . ABDOMINAL  HYSTERECTOMY     partial in 1988  . ANKLE SURGERY Left 1995   "tendon repair" (06/24/2013)  . BACK SURGERY     "think today was my 8th back OR" (06/24/2013)  . CERVICAL SPINE SURGERY  2012  . [place]  . CHOLECYSTECTOMY N/A 04/16/2013   Procedure: LAPAROSCOPIC CHOLECYSTECTOMY ;  Surgeon: Imogene Burn. Georgette Dover, MD;  Location: [facility];  Service: General;  Laterality: N/A;  . ELBOW SURGERY  1990's  . FOOT SURGERY Right 2012   SPUR REMOVED  . INFUSION PUMP IMPLANTATION  1990's   "implantablet morphine pump; took it out w/in 11 months  . KNEE ARTHROSCOPY Left 1991; ~ 1993  . LAPAROSCOPIC LYSIS OF ADHESIONS N/A 04/16/2013   Procedure: LAPAROSCOPIC LYSIS OF ADHESIONS;  Surgeon: Imogene Burn. Georgette Dover, MD;  Location: [facility];   Service: General;  Laterality: N/A;  . LUMBAR FUSION     and rods  . LUMBAR LAMINECTOMY/DECOMPRESSION MICRODISCECTOMY N/A 11/28/2012   Procedure: DECOMPRESSIVE LUMBAR LAMINECTOMY LEVEL 1;  Surgeon: Elaina Hoops, MD;  Location: [facility];  Service: Neurosurgery;  Laterality: N/A;  DECOMPRESSIVE LUMBAR LAMINECTOMY LEVEL 1  . ORIF DISTAL RADIUS FRACTURE Left 12/30/2013   dr Caralyn Guile  . ORIF WRIST FRACTURE Left 12/30/2013   Procedure: OPEN REDUCTION INTERNAL FIXATION (ORIF) LEFT WRIST FRACTURE AND REPAIR AS INDICATED;  Surgeon: Linna Hoff, MD;  Location: [facility];  Service: Orthopedics;  Laterality: Left;  . POSTERIOR FUSION LUMBAR SPINE  06/24/2013  . ROBOTIC ASSISTED SALPINGO OOPHERECTOMY Bilateral 08/20/2014   Procedure: ROBOTIC ASSISTED SALPINGO OOPHORECTOMY;  Surgeon: Daria Pastures, MD;  Location: [facility];  Service: Gynecology;  Laterality: Bilateral;  . SHOULDER ARTHROSCOPY Left 09/2011  . THYROIDECTOMY  2012  . TOTAL HIP ARTHROPLASTY Left 04/27/2019   Procedure: TOTAL HIP ARTHROPLASTY ANTERIOR APPROACH;  Surgeon: Gaynelle Arabian, MD;  Location: [facility];  Service: Orthopedics;  Laterality: Left;  174min  . TOTAL HIP ARTHROPLASTY Right 09/09/2019   Procedure: TOTAL HIP ARTHROPLASTY ANTERIOR APPROACH;  Surgeon: Gaynelle Arabian, MD;  Location: [facility];  Service: Orthopedics;  Laterality: Right;  158min  . TOTAL KNEE ARTHROPLASTY Right 09/09/2017   Procedure: RIGHT TOTAL KNEE ARTHROPLASTY;  Surgeon: Gaynelle Arabian, MD;  Location: [facility];  Service: Orthopedics;  Laterality: Right;    Current Medications: Current Meds  Medication Sig  . bisacodyl (DULCOLAX) 5 MG EC tablet Take 5-10 mg by mouth daily as needed for mild constipation.   . carisoprodol (SOMA) 350 MG tablet Take 175 mg by mouth 3 (three) times daily.   . dorzolamide-timolol (COSOPT) 22.3-6.8 MG/ML ophthalmic solution Place 1 drop into the left eye 2 (two) times daily.  Marland Kitchen estradiol (ESTRACE) 0.5 MG tablet Take 0.5 mg by mouth at bedtime.    . furosemide (LASIX) 40 MG tablet Take 1 tablet by mouth once daily (Patient taking differently: Take 40 mg by mouth daily. )  . hydrochlorothiazide (HYDRODIURIL) 25 MG tablet TAKE 1 TABLET BY MOUTH  DAILY (Patient taking differently: Take 25 mg by mouth daily. )  . levothyroxine (SYNTHROID) 88 MCG tablet Take 1 tablet (88 mcg total) by mouth as directed. 2 tablets on Sundays, 1 tablet Monday through Saturday  . oxyCODONE-acetaminophen (PERCOCET) 10-325 MG tablet Take 1 tablet by mouth every 4 (four) hours as needed for pain.   . potassium chloride SA (KLOR-CON) 20 MEQ tablet Take 3 tablets (60 mEq total) by mouth daily.  . simvastatin (ZOCOR) 20 MG tablet TAKE 1 TABLET BY MOUTH AT  BEDTIME (  Patient taking differently: Take 20 mg by mouth at bedtime. )  . traZODone (DESYREL) 50 MG tablet TAKE 1 TABLET BY MOUTH AT BEDTIME  . [DISCONTINUED] fenofibrate 160 MG tablet Take 1 tablet by mouth once daily     Allergies:    Morphine and related, 5-alpha reductase inhibitors, Amitriptyline, Gabapentin, and Triamterene   Social History: Social History   Socioeconomic History  . Marital status: Divorced    Spouse name: Not on file  . Number of children: Not on file  . Years of education: Not on file  . Highest education level: Not on file  Occupational History  . Not on file  Tobacco Use  . Smoking status: Current Every Day Smoker    Packs/day: 1.50    Years: 50.00    Pack years: 75.00    Types: Cigarettes  . Smokeless tobacco: Never Used  Substance and Sexual Activity  . Alcohol use: No    Alcohol/week: 0.0 standard drinks  . Drug use: No  . Sexual activity: Not Currently  Other Topics Concern  . Not on file  Social History Narrative   Divorced   Current Smoker  1 ppd -  20 yrs      Alcohol use-no       [employer] - laid off       Physician roster:   Dr. Elta Guadeloupe Philips - pain management   Dr. Philis Pique - GYN   Dr. Saintclair Halsted - Neurosurgery   Dr. Ronnald Ramp - dermatology    Dr. Caralyn Guile - orthopedics   Dr. Fransico Him - cardiology   Dr. Miller-ophthalmology   Social Determinants of Health   Financial Resource Strain:   . Difficulty of Paying Living Expenses:   Food Insecurity:   . Worried About Charity fundraiser in the Last Year:   . Arboriculturist in the Last Year:   Transportation Needs:   . Film/video editor (Medical):   Marland Kitchen Lack of Transportation (Non-Medical):   Physical Activity:   . Days of Exercise per Week:   . Minutes of Exercise per Session:   Stress:   . Feeling of Stress :   Social Connections:   . Frequency of Communication with Friends and Family:   . Frequency of Social Gatherings with Friends and Family:   . Attends Religious Services:   . Active Member of Clubs or Organizations:   . Attends Archivist Meetings:   Marland Kitchen Marital Status:     Family History: The patient's family history includes Bone cancer in her sister; Breast cancer in her maternal aunt and sister; Cancer in her father; Coronary artery disease (age of onset: 56) in her mother; Diabetes in her mother and sister; Diabetes type II in her mother; Hypertension in her brother, mother, and sister; Pancreatic cancer in her father; Skin cancer in her mother; Thyroid cancer in her mother. There is no history of Other.  ROS:   All other ROS reviewed and negative. Pertinent positives noted in the HPI.     EKGs/Labs/Other Studies Reviewed:   The following studies were personally reviewed by me today:  EKG:  EKG is ordered today.  The ekg ordered today demonstrates normal sinus rhythm, heart rate 64, nonspecific ST-T changes, no evidence of prior infarction, and was personally reviewed by me.   TTE 02/17/2019 1. The left ventricle has mildly reduced systolic function, with an  ejection fraction of 45-50%. The cavity size was normal. Left ventricular  diastolic Doppler parameters  are consistent with impaired relaxation. Left  ventricular diffuse hypokinesis.    Cannot rule out apical wall motion abnormality or thrombus as apex is not  adequately visualized.  2. The right ventricle has normal systolic function. The cavity was  normal. There is no increase in right ventricular wall thickness. Right  ventricular systolic pressure could not be assessed.  3. The aortic valve was not well visualized. Mild sclerosis of the aortic  valve. Aortic valve regurgitation was not assessed by color flow Doppler.  4. The aorta is normal in size and structure.  5. Recommend limited study with definity contrast to assess wall motion.   NM Stress 2018  Nuclear stress EF: 64%.  The left ventricular ejection fraction is normal (55-65%).  There was no ST segment deviation noted during stress.  No T wave inversion was noted during stress.  This is a low risk study.   Recent Labs: 03/12/2019: BNP 13.0 09/03/2019: ALT 19 09/10/2019: Hemoglobin 9.8; Platelets 166 09/11/2019: BUN 11; Creatinine, Ser 0.76; Potassium 4.2; Sodium 135 10/21/2019: TSH 1.04   Recent Lipid Panel    Component Value Date/Time   CHOL 171 08/13/2019 1416   TRIG 104.0 08/13/2019 1416   HDL 62.60 08/13/2019 1416   CHOLHDL 3 08/13/2019 1416   VLDL 20.8 08/13/2019 1416   LDLCALC 87 08/13/2019 1416   LDLDIRECT 106.0 11/29/2015 1515    Physical Exam:   VS:  BP 122/74   Pulse 64   Ht 5' (1.524 m)   Wt 168 lb 9.6 oz (76.5 kg)   SpO2 98%   BMI 32.93 kg/m    Wt Readings from Last 3 Encounters:  10/29/19 168 lb 9.6 oz (76.5 kg)  09/09/19 171 lb 11.8 oz (77.9 kg)  09/03/19 171 lb 12.8 oz (77.9 kg)    General: well nourished, well developed, in no acute distress Heart: Atraumatic, normal size  Eyes: PEERLA, EOMI  Neck: Supple, no JVD Endocrine: No thryomegaly Cardiac: Normal S1, S2; RRR; no murmurs, rubs, or gallops Lungs: Clear to auscultation bilaterally, no wheezing, rhonchi or rales  Abd: Soft, nontender, no hepatomegaly  Ext: Lower extremities with chronic venous  insufficiency, 1+ edema Musculoskeletal: No deformities, BUE and BLE strength normal and equal Skin: Warm and dry, no rashes   Neuro: Alert and oriented to person, place, time, and situation, CNII-XII grossly intact, no focal deficits  Psych: Normal mood and affect   ASSESSMENT:   Joan [name] is a 74 y.o. female who presents for the following: 1. Fatigue, unspecified type   2. Venous insufficiency   3. Leg edema   4. Mixed hyperlipidemia   5. SOB (shortness of breath) on exertion   6. TOBACCO ABUSE     PLAN:   1. Fatigue, unspecified type -She reports she is fatigued.  Not really short of breath.  Echocardiogram recently normal my review.  She has no evidence of congestive heart failure.  BNP was 19.  She does have lower extremity edema but this is venous insufficiency. -Most recent hemoglobin was 9.8.  This is a considerable drop from February value of 13.  I think we should repeat this that she has recently had bilateral hip replacements.  I do wonder about persistent anemia to explain her symptoms.  2. Venous insufficiency 3. Leg edema -Profound venous insufficiency changes.  ABIs in 2019 were normal.  She has no evidence of DVT on a recent ultrasound of her legs. -I recommended to continue Lasix as well as compression stockings.  She will also elevate her legs much as possible moving forward.  4. Mixed hyperlipidemia -Most recent LDL cholesterol 87 and triglycerides 104.  Really no need for fenofibrate at this point.  We will stop this.  5. SOB (shortness of breath) on exertion 6. TOBACCO ABUSE -Continues to smoke.  I suspect much of this is possible COPD.  Counseled on smoking cessation today.  Disposition: Return in about 6 months (around 04/29/2020).  Medication Adjustments/Labs and Tests Ordered: Current medicines are reviewed at length with the patient today.  Concerns regarding medicines are outlined above.  Orders Placed This Encounter  Procedures  . CBC  .  EKG 12-Lead   No orders of the defined types were placed in this encounter.   Patient Instructions  Medication Instructions:  Stop Fenofibrate   *If you need a refill on your cardiac medications before your next appointment, please call your pharmacy*   Lab Work: CBC today  If you have labs (blood work) drawn today and your tests are completely normal, you will receive your results only by: Marland Kitchen MyChart Message (if you have MyChart) OR . A paper copy in the mail If you have any lab test that is abnormal or we need to change your treatment, we will call you to review the results.   Follow-Up: At [facility], you and your health needs are our priority.  As part of our continuing mission to provide you with exceptional heart care, we have created designated Provider Care Teams.  These Care Teams include your primary Cardiologist (physician) and Advanced Practice Providers (APPs -  Physician Assistants and Nurse Practitioners) who all work together to provide you with the care you need, when you need it.  We recommend signing up for the patient portal called "MyChart".  Sign up information is provided on this After Visit Summary.  MyChart is used to connect with patients for Virtual Visits (Telemedicine).  Patients are able to view lab/test results, encounter notes, upcoming appointments, etc.  Non-urgent messages can be sent to your provider as well.   To learn more about what you can do with MyChart, go to [email or web address].    Your next appointment:   6 month(s)  The format for your next appointment:   In Person  Provider:   Eleonore Chiquito, MD      Time Spent with Patient: I have spent a total of 25 minutes with patient reviewing hospital notes, telemetry, EKGs, labs and examining the patient as well as establishing an assessment and plan that was discussed with the patient.  > 50% of time was spent in direct patient care.  Signed, Addison Naegeli. Audie Box, [place]  [address], [place], [place] [address] [phone]  10/29/2019 1:48 PM

## 2019-10-29 ENCOUNTER — Encounter: Payer: Self-pay | Admitting: Cardiovascular Disease

## 2019-10-29 ENCOUNTER — Other Ambulatory Visit: Payer: Self-pay

## 2019-10-29 ENCOUNTER — Ambulatory Visit: Payer: Medicare Other | Admitting: Cardiovascular Disease

## 2019-10-29 VITALS — BP 122/74 | HR 64 | Ht 60.0 in | Wt 168.6 lb

## 2019-10-29 DIAGNOSIS — E782 Mixed hyperlipidemia: Secondary | ICD-10-CM | POA: Diagnosis not present

## 2019-10-29 DIAGNOSIS — M961 Postlaminectomy syndrome, not elsewhere classified: Secondary | ICD-10-CM | POA: Diagnosis not present

## 2019-10-29 DIAGNOSIS — R600 Localized edema: Secondary | ICD-10-CM

## 2019-10-29 DIAGNOSIS — R0602 Shortness of breath: Secondary | ICD-10-CM | POA: Diagnosis not present

## 2019-10-29 DIAGNOSIS — I872 Venous insufficiency (chronic) (peripheral): Secondary | ICD-10-CM

## 2019-10-29 DIAGNOSIS — R5383 Other fatigue: Secondary | ICD-10-CM | POA: Diagnosis not present

## 2019-10-29 DIAGNOSIS — F17200 Nicotine dependence, unspecified, uncomplicated: Secondary | ICD-10-CM

## 2019-10-29 DIAGNOSIS — G5702 Lesion of sciatic nerve, left lower limb: Secondary | ICD-10-CM | POA: Diagnosis not present

## 2019-10-29 DIAGNOSIS — M25552 Pain in left hip: Secondary | ICD-10-CM | POA: Diagnosis not present

## 2019-10-29 DIAGNOSIS — M25551 Pain in right hip: Secondary | ICD-10-CM | POA: Diagnosis not present

## 2019-10-29 NOTE — Patient Instructions (Signed)
Medication Instructions:  Stop Fenofibrate   *If you need a refill on your cardiac medications before your next appointment, please call your pharmacy*   Lab Work: CBC today  If you have labs (blood work) drawn today and your tests are completely normal, you will receive your results only by: Marland Kitchen MyChart Message (if you have MyChart) OR . A paper copy in the mail If you have any lab test that is abnormal or we need to change your treatment, we will call you to review the results.   Follow-Up: At [facility], you and your health needs are our priority.  As part of our continuing mission to provide you with exceptional heart care, we have created designated Provider Care Teams.  These Care Teams include your primary Cardiologist (physician) and Advanced Practice Providers (APPs -  Physician Assistants and Nurse Practitioners) who all work together to provide you with the care you need, when you need it.  We recommend signing up for the patient portal called "MyChart".  Sign up information is provided on this After Visit Summary.  MyChart is used to connect with patients for Virtual Visits (Telemedicine).  Patients are able to view lab/test results, encounter notes, upcoming appointments, etc.  Non-urgent messages can be sent to your provider as well.   To learn more about what you can do with MyChart, go to [email or web address].    Your next appointment:   6 month(s)  The format for your next appointment:   In Person  Provider:   Eleonore Chiquito, MD

## 2019-10-30 LAB — CBC
Hematocrit: 39.7 % (ref 34.0–46.6)
Hemoglobin: 13.2 g/dL (ref 11.1–15.9)
MCH: 30 pg (ref 26.6–33.0)
MCHC: 33.2 g/dL (ref 31.5–35.7)
MCV: 90 fL (ref 79–97)
Platelets: 237 10*3/uL (ref 150–450)
RBC: 4.4 x10E6/uL (ref 3.77–5.28)
RDW: 12.7 % (ref 11.7–15.4)
WBC: 4.1 10*3/uL (ref 3.4–10.8)

## 2019-11-13 ENCOUNTER — Other Ambulatory Visit: Payer: Self-pay | Admitting: Family Medicine

## 2019-11-13 DIAGNOSIS — R600 Localized edema: Secondary | ICD-10-CM

## 2019-11-19 IMAGING — RF DG HIP (WITH PELVIS) OPERATIVE*L*
1 series · 1 of 1 positions shown · non-contrast
Comparison: February 27, 2007.

CLINICAL DATA: Status post left total hip replacement.

EXAM:
OPERATIVE left HIP (WITH PELVIS IF PERFORMED) 2 VIEWS
TECHNIQUE: Fluoroscopic spot image(s) were submitted for interpretation
post-operatively.
Radiation exposure index: 2.0722 mGy.

[Series 1: unknown protocol · 0.20mm/px · 1 of 1 slices shown]
[im 1/1]
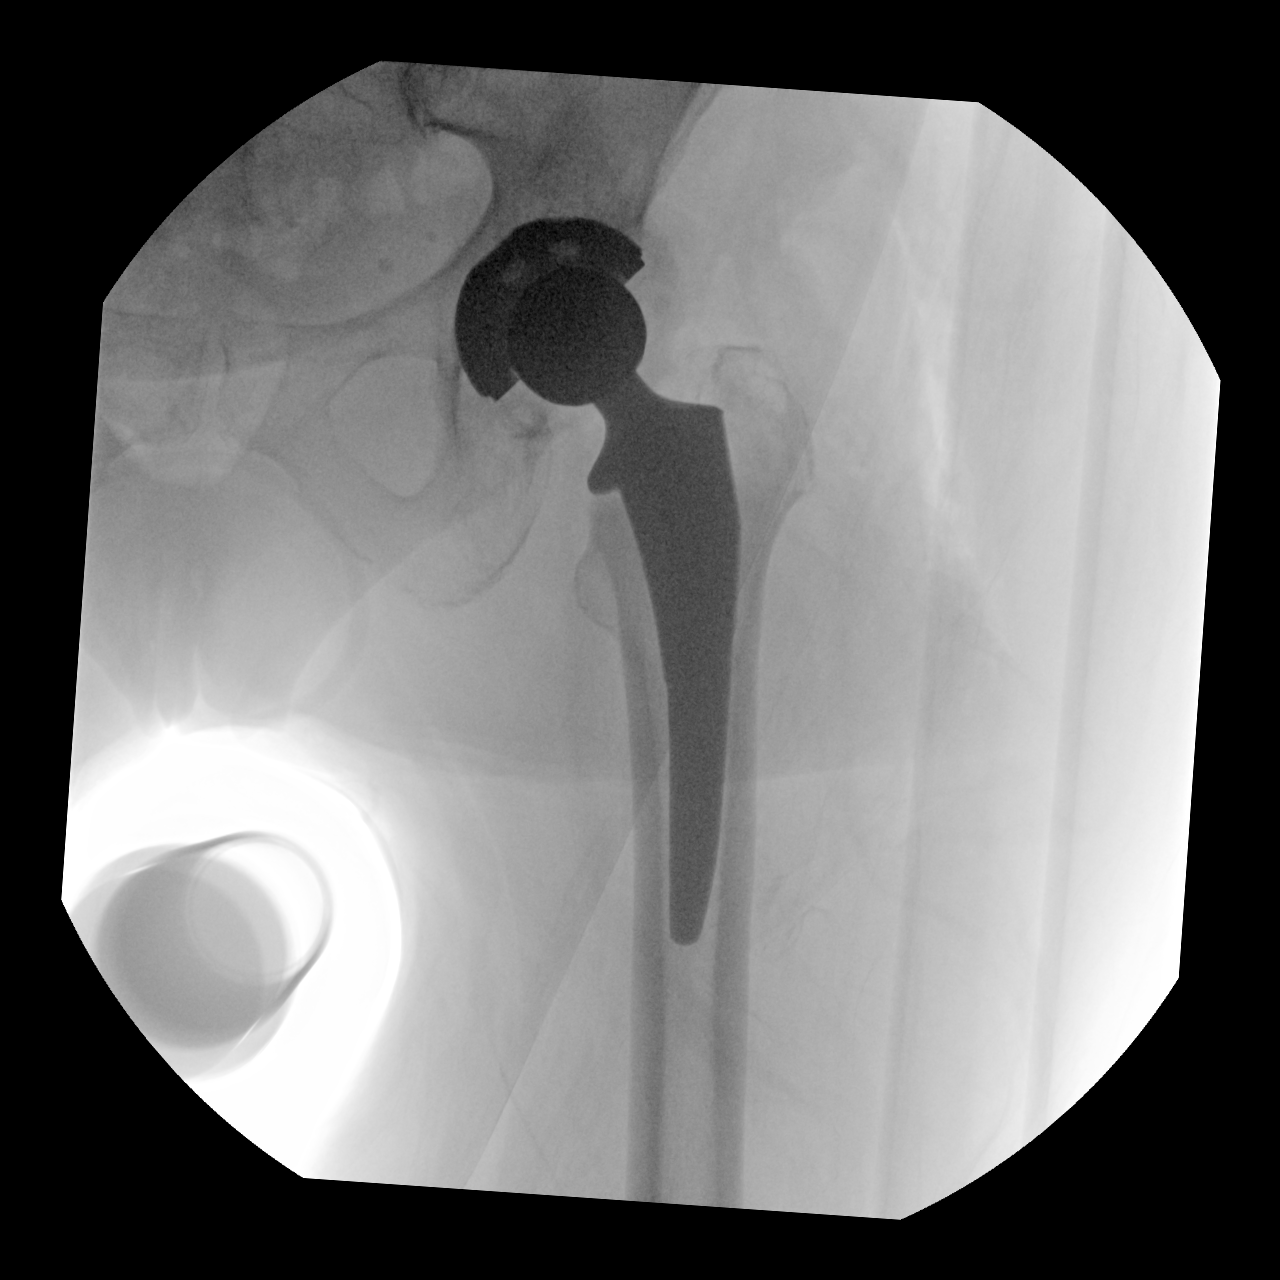

[1 of 1 positions shown; findings below may reference images not displayed]

FINDINGS: Two intraoperative fluoroscopic images were obtained of the left
hip. These demonstrate the left femoral and acetabular components to
be well situated. Expected postoperative changes are noted in the
surrounding soft tissues.
IMPRESSION: Fluoroscopic guidance provided during left total hip arthroplasty.

## 2019-11-19 IMAGING — DX DG PORTABLE PELVIS
1 series · 1 of 1 positions shown · non-contrast
Comparison: Radiographs dated 02/27/2007

CLINICAL DATA: Osteoarthritis of the left hip. Status post left
total hip arthroplasty.

EXAM:
PORTABLE PELVIS 1-2 VIEWS

[pelvis ap]
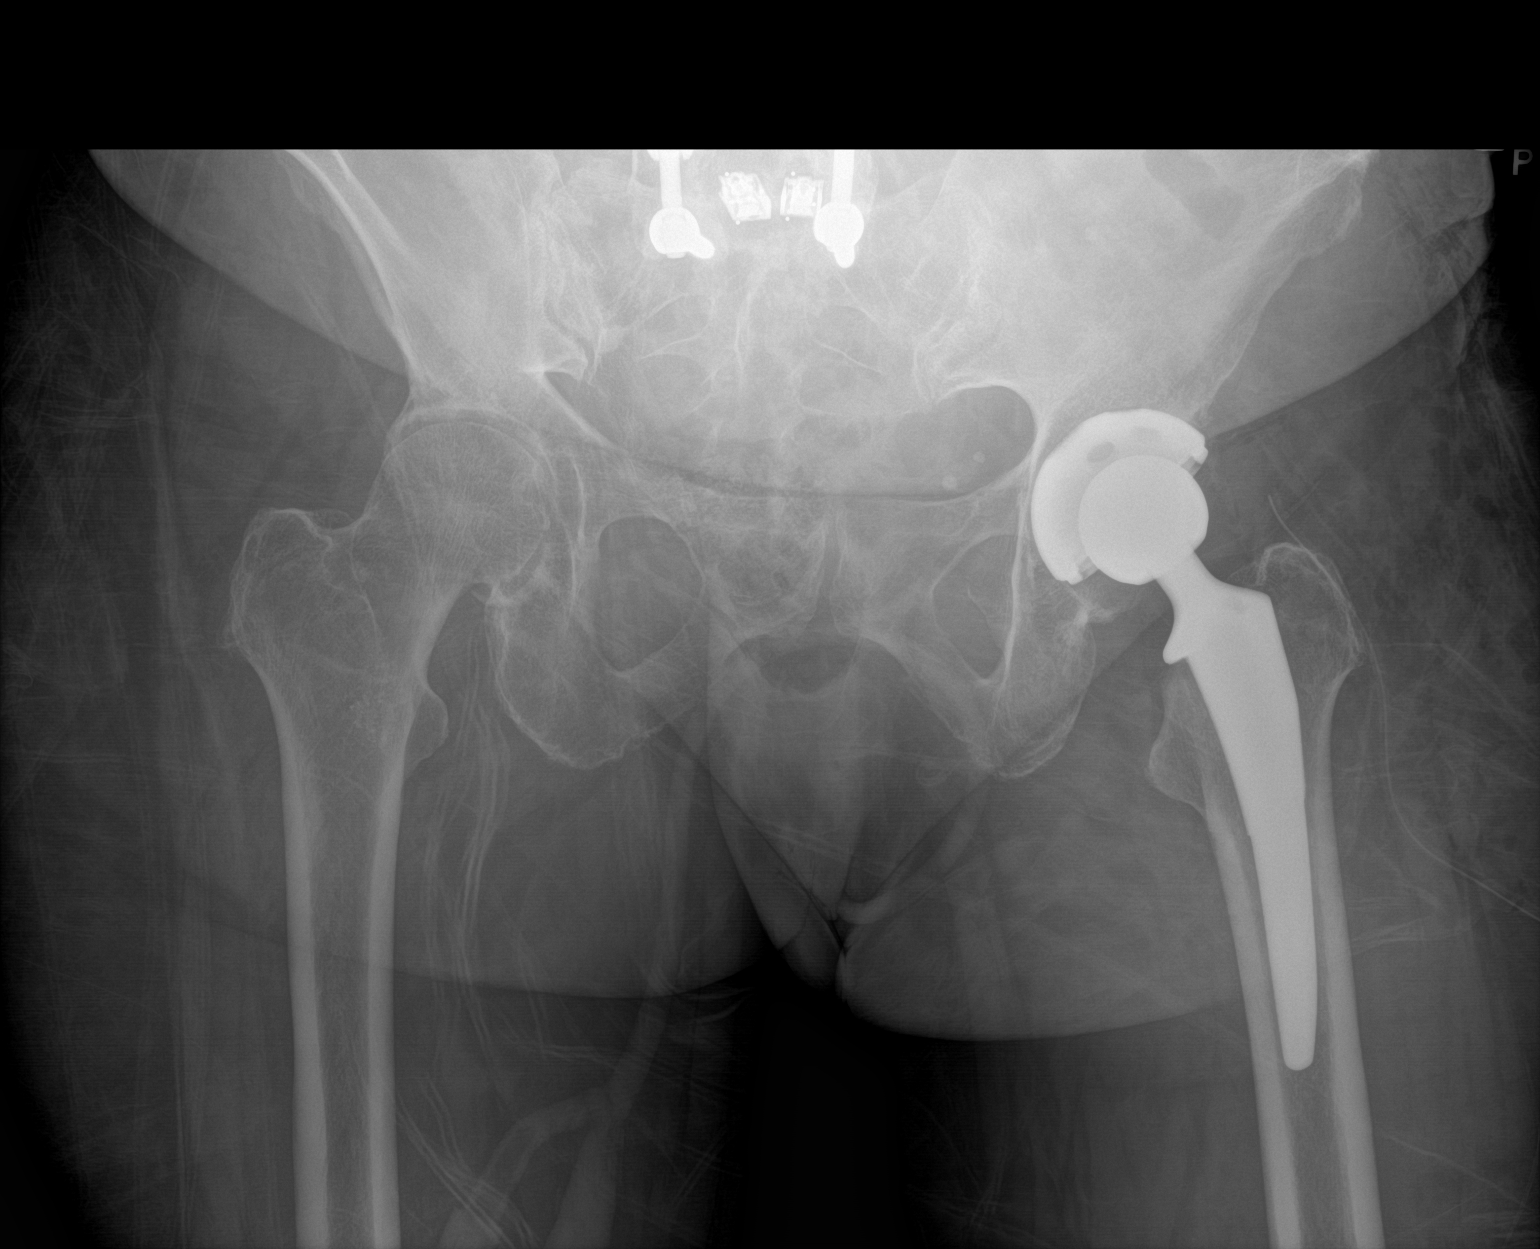

[1 of 1 positions shown; findings below may reference images not displayed]

FINDINGS: The acetabular and femoral components of the left total hip
prosthesis appear in excellent position in the AP projection. Soft
tissue drain in place. No fractures.

Previous lower lumbar surgery.

No other significant abnormalities.

:
Satisfactory appearance of the left hip after total hip replacement.

## 2019-11-26 ENCOUNTER — Telehealth: Payer: Self-pay | Admitting: Family Medicine

## 2019-11-26 NOTE — Telephone Encounter (Signed)
Left vm for patient to switch appt to [place] [facility] to see her ENDO.

## 2019-12-01 ENCOUNTER — Other Ambulatory Visit: Payer: Self-pay

## 2019-12-01 ENCOUNTER — Ambulatory Visit (INDEPENDENT_AMBULATORY_CARE_PROVIDER_SITE_OTHER): Payer: Medicare Other | Admitting: Family Medicine

## 2019-12-01 ENCOUNTER — Encounter: Payer: Self-pay | Admitting: Family Medicine

## 2019-12-01 VITALS — BP 120/80 | HR 77 | Temp 97.0°F | Resp 18 | Ht 60.0 in | Wt 162.0 lb

## 2019-12-01 DIAGNOSIS — R609 Edema, unspecified: Secondary | ICD-10-CM | POA: Diagnosis not present

## 2019-12-01 DIAGNOSIS — I10 Essential (primary) hypertension: Secondary | ICD-10-CM

## 2019-12-01 DIAGNOSIS — D500 Iron deficiency anemia secondary to blood loss (chronic): Secondary | ICD-10-CM

## 2019-12-01 DIAGNOSIS — E039 Hypothyroidism, unspecified: Secondary | ICD-10-CM | POA: Diagnosis not present

## 2019-12-01 DIAGNOSIS — E876 Hypokalemia: Secondary | ICD-10-CM

## 2019-12-01 DIAGNOSIS — E785 Hyperlipidemia, unspecified: Secondary | ICD-10-CM

## 2019-12-01 DIAGNOSIS — R600 Localized edema: Secondary | ICD-10-CM

## 2019-12-01 MED ORDER — FUROSEMIDE 40 MG PO TABS: 40.0000 mg | ORAL_TABLET | Freq: Two times a day (BID) | ORAL | 1 refills | Status: DC

## 2019-12-01 NOTE — Progress Notes (Signed)
Patient ID: Joan [name], female    DOB: [birth year]  Age: 74 y.o. MRN: [id number]    Subjective:  Subjective  HPI NELIAH DEBELLO presents for f/u edema--- -she is taking lasix and hctz and her legs are worse.  No sob , no cp Pt did just have knee surgery No calf pain   Review of Systems  Constitutional: Negative for appetite change, diaphoresis, fatigue and unexpected weight change.  Eyes: Negative for pain, redness and visual disturbance.  Respiratory: Negative for cough, chest tightness, shortness of breath and wheezing.   Cardiovascular: Positive for leg swelling. Negative for chest pain and palpitations.  Endocrine: Negative for cold intolerance, heat intolerance, polydipsia, polyphagia and polyuria.  Genitourinary: Negative for difficulty urinating, dysuria and frequency.  Neurological: Negative for dizziness, light-headedness, numbness and headaches.    History Past Medical History:  Diagnosis Date  . Arthritis    "shoulders; wrist; probably spine" (06/24/2013)  . Chronic low back pain    followed by Dr Hardin Negus pain mgt  . Colon polyp   . Constipation   . Esophageal stricture   . Finger pain, left    2 fingers on left hand since wrist surgery  . GERD (gastroesophageal reflux disease)   . Heart murmur    "slight; not on RX" (06/24/2013)  . History of cardiac arrhythmia    cardiologist- Traci Turner  . Hx of colonic polyps 08/28/2004  . Hyperlipemia   . Hypertension   . Hypothyroidism   . Osteoarthritis of left knee    advanced  . PONV (postoperative nausea and vomiting)    Pt reports symptoms are the result of gallbladder and cholecystectomy, not anesthesia  . PVC's (premature ventricular contractions)   . Sleep apnea 1990's   "tested; tried mask; couldn't stand it; told me as long as I slept on my side I'd be ok" (06/24/2013)  . Thyroid nodule   . Tobacco abuse     She has a past surgical history that includes Lumbar fusion; Cesarean section (1972);  Ankle surgery (Left, 1995); Infusion pump implantation (1990's); Knee arthroscopy (Left, 1991; ~ 1993); Elbow surgery (1990's); Thyroidectomy (2012); Cervical spine surgery (2012); Foot surgery (Right, 2012); Shoulder arthroscopy (Left, 09/2011); Lumbar laminectomy/decompression microdiscectomy (N/A, 11/28/2012); Back surgery; Cholecystectomy (N/A, 04/16/2013); Laparoscopic lysis of adhesions (N/A, 04/16/2013); Posterior fusion lumbar spine (06/24/2013); Abdominal hysterectomy (1988); ORIF distal radius fracture (Left, 12/30/2013); ORIF wrist fracture (Left, 12/30/2013); Robotic assisted salpingo oophorectomy (Bilateral, 08/20/2014); Abdominal hysterectomy; Total knee arthroplasty (Right, 09/09/2017); Total hip arthroplasty (Left, 04/27/2019); and Total hip arthroplasty (Right, 09/09/2019).   Her family history includes Bone cancer in her sister; Breast cancer in her maternal aunt and sister; Cancer in her father; Coronary artery disease (age of onset: 43) in her mother; Diabetes in her mother and sister; Diabetes type II in her mother; Hypertension in her brother, mother, and sister; Pancreatic cancer in her father; Skin cancer in her mother; Thyroid cancer in her mother.She reports that she has been smoking cigarettes. She has a 75.00 pack-year smoking history. She has never used smokeless tobacco. She reports that she does not drink alcohol or use drugs.  Current Outpatient Medications on File Prior to Visit  Medication Sig Dispense Refill  . bisacodyl (DULCOLAX) 5 MG EC tablet Take 5-10 mg by mouth daily as needed for mild constipation.  30 tablet   . carisoprodol (SOMA) 350 MG tablet Take 175 mg by mouth 3 (three) times daily.     . dorzolamide-timolol (COSOPT) 22.3-6.8 MG/ML ophthalmic solution  Place 1 drop into the left eye 2 (two) times daily.    Marland Kitchen estradiol (ESTRACE) 0.5 MG tablet Take 0.5 mg by mouth at bedtime.   4  . hydrochlorothiazide (HYDRODIURIL) 25 MG tablet TAKE 1 TABLET BY MOUTH  DAILY (Patient  taking differently: Take 25 mg by mouth daily. ) 90 tablet 3  . levothyroxine (SYNTHROID) 88 MCG tablet Take 1 tablet (88 mcg total) by mouth as directed. 2 tablets on Sundays, 1 tablet Monday through Saturday 104 tablet 1  . oxyCODONE-acetaminophen (PERCOCET) 10-325 MG tablet Take 1 tablet by mouth every 4 (four) hours as needed for pain.     . potassium chloride SA (KLOR-CON) 20 MEQ tablet Take 3 tablets (60 mEq total) by mouth daily. 7 tablet 0  . simvastatin (ZOCOR) 20 MG tablet TAKE 1 TABLET BY MOUTH AT  BEDTIME (Patient taking differently: Take 20 mg by mouth at bedtime. ) 90 tablet 1  . traZODone (DESYREL) 50 MG tablet TAKE 1 TABLET BY MOUTH AT BEDTIME 90 tablet 0   No current facility-administered medications on file prior to visit.     Objective:  Objective  Physical Exam Vitals and nursing note reviewed.  Constitutional:      Appearance: She is well-developed.  HENT:     Head: Normocephalic and atraumatic.  Eyes:     Conjunctiva/sclera: Conjunctivae normal.  Neck:     Thyroid: No thyromegaly.     Vascular: No carotid bruit or JVD.  Cardiovascular:     Rate and Rhythm: Normal rate and regular rhythm.     Heart sounds: Normal heart sounds. No murmur.  Pulmonary:     Effort: Pulmonary effort is normal. No respiratory distress.     Breath sounds: Normal breath sounds. No wheezing or rales.  Chest:     Chest wall: No tenderness.  Musculoskeletal:        General: Swelling present.     Cervical back: Normal range of motion and neck supple.     Right lower leg: 1+ Pitting Edema present.     Left lower leg: 1+ Pitting Edema present.     Right ankle: Swelling present.     Left ankle: Swelling present.  Neurological:     Mental Status: She is alert and oriented to person, place, and time.    BP 120/80 (BP Location: Left Arm, Patient Position: Sitting, Cuff Size: Large)   Pulse 77   Temp (!) 97 F (36.1 C) (Temporal)   Resp 18   Ht 5' (1.524 m)   Wt 162 lb (73.5 kg)    SpO2 95%   BMI 31.64 kg/m  Wt Readings from Last 3 Encounters:  12/01/19 162 lb (73.5 kg)  10/29/19 168 lb 9.6 oz (76.5 kg)  09/09/19 171 lb 11.8 oz (77.9 kg)     Lab Results  Component Value Date   WBC 4.1 10/29/2019   HGB 13.2 10/29/2019   HCT 39.7 10/29/2019   PLT 237 10/29/2019   GLUCOSE 166 (H) 09/11/2019   CHOL 171 08/13/2019   TRIG 104.0 08/13/2019   HDL 62.60 08/13/2019   LDLDIRECT 106.0 11/29/2015   LDLCALC 87 08/13/2019   ALT 19 09/03/2019   AST 23 09/03/2019   NA 135 09/11/2019   K 4.2 09/11/2019   CL 103 09/11/2019   CREATININE 0.76 09/11/2019   BUN 11 09/11/2019   CO2 26 09/11/2019   TSH 1.04 10/21/2019   INR 1.0 09/03/2019   HGBA1C 6.0 12/30/2017    MM 3D  SCREEN BREAST BILATERAL  Result Date: 10/23/2019 CLINICAL DATA:  Screening. EXAM: DIGITAL SCREENING BILATERAL MAMMOGRAM WITH TOMO AND CAD COMPARISON:  Previous exam(s). ACR Breast Density Category c: The breast tissue is heterogeneously dense, which may obscure small masses. FINDINGS: There are no findings suspicious for malignancy. Images were processed with CAD. IMPRESSION: No mammographic evidence of malignancy. A result letter of this screening mammogram will be mailed directly to the patient. RECOMMENDATION: Screening mammogram in one year. (Code:SM-B-01Y) BI-RADS CATEGORY  1: Negative. Electronically Signed   By: Abelardo Diesel M.D.   On: 10/23/2019 13:55     Assessment & Plan:  Plan  I have changed Olen Cordial. Loberg's furosemide. I am also having her maintain her carisoprodol, bisacodyl, dorzolamide-timolol, estradiol, potassium chloride SA, oxyCODONE-acetaminophen, simvastatin, hydrochlorothiazide, levothyroxine, and traZODone.  Meds ordered this encounter  Medications  . furosemide (LASIX) 40 MG tablet    Sig: Take 1 tablet (40 mg total) by mouth 2 (two) times daily.    Dispense:  180 tablet    Refill:  1    Problem List Items Addressed This Visit      Unprioritized   Anemia   Relevant  Orders   CBC with Differential/Platelet   Edema    Elevate legs [employer] lasix to bid  F/u 2 weeks --- may need cardio f/u       Essential hypertension    Well controlled, no changes to meds. Encouraged heart healthy diet such as the DASH diet and exercise as tolerated.       Relevant Medications   furosemide (LASIX) 40 MG tablet   Hyperlipidemia    Encouraged heart healthy diet, increase exercise, avoid trans fats, consider a krill oil cap daily      Relevant Medications   furosemide (LASIX) 40 MG tablet   Other Relevant Orders   Lipid panel   Comprehensive metabolic panel   Hypokalemia    Check labs today      Hypothyroidism - Primary    Check labs and con't meds      Relevant Orders   Comprehensive metabolic panel    Other Visit Diagnoses    Lower extremity edema       Relevant Medications   furosemide (LASIX) 40 MG tablet      Follow-up: Return in about 2 weeks (around 12/15/2019), or if symptoms worsen or fail to improve, for edema.  [name], DO

## 2019-12-01 NOTE — Patient Instructions (Signed)

## 2019-12-01 NOTE — Assessment & Plan Note (Signed)
Check labs today.

## 2019-12-01 NOTE — Assessment & Plan Note (Signed)
Elevate legs [employer] lasix to bid  F/u 2 weeks --- may need cardio f/u

## 2019-12-01 NOTE — Assessment & Plan Note (Signed)
Check labs and con't meds

## 2019-12-01 NOTE — Assessment & Plan Note (Signed)
Well controlled, no changes to meds. Encouraged heart healthy diet such as the DASH diet and exercise as tolerated.  °

## 2019-12-01 NOTE — Assessment & Plan Note (Signed)
Encouraged heart healthy diet, increase exercise, avoid trans fats, consider a krill oil cap daily 

## 2019-12-02 LAB — COMPREHENSIVE METABOLIC PANEL
ALT: 14 U/L (ref 0–35)
AST: 18 U/L (ref 0–37)
Albumin: 4.1 g/dL (ref 3.5–5.2)
Alkaline Phosphatase: 84 U/L (ref 39–117)
BUN: 16 mg/dL (ref 6–23)
CO2: 31 mEq/L (ref 19–32)
Calcium: 8.9 mg/dL (ref 8.4–10.5)
Chloride: 97 mEq/L (ref 96–112)
Creatinine, Ser: 0.95 mg/dL (ref 0.40–1.20)
GFR: 57.34 mL/min — ABNORMAL LOW (ref >60.00)
Glucose, Bld: 116 mg/dL — ABNORMAL HIGH (ref 70–99)
Potassium: 3 mEq/L — ABNORMAL LOW (ref 3.5–5.1)
Sodium: 139 mEq/L (ref 135–145)
Total Bilirubin: 0.3 mg/dL (ref 0.2–1.2)
Total Protein: 6.3 g/dL (ref 6.0–8.3)

## 2019-12-02 LAB — CBC WITH DIFFERENTIAL/PLATELET
Basophils Absolute: 0.1 10*3/uL (ref 0.0–0.1)
Basophils Relative: 1.1 % (ref 0.0–3.0)
Eosinophils Absolute: 0.1 10*3/uL (ref 0.0–0.7)
Eosinophils Relative: 1.8 % (ref 0.0–5.0)
HCT: 37.9 % (ref 36.0–46.0)
Hemoglobin: 12.8 g/dL (ref 12.0–15.0)
Lymphocytes Relative: 30.4 % (ref 12.0–46.0)
Lymphs Abs: 1.6 10*3/uL (ref 0.7–4.0)
MCHC: 33.8 g/dL (ref 30.0–36.0)
MCV: 87.6 fl (ref 78.0–100.0)
Monocytes Absolute: 0.7 10*3/uL (ref 0.1–1.0)
Monocytes Relative: 13.9 % — ABNORMAL HIGH (ref 3.0–12.0)
Neutro Abs: 2.7 10*3/uL (ref 1.4–7.7)
Neutrophils Relative %: 52.8 % (ref 43.0–77.0)
Platelets: 206 10*3/uL (ref 150.0–400.0)
RBC: 4.33 Mil/uL (ref 3.87–5.11)
RDW: 13.6 % (ref 11.5–15.5)
WBC: 5.2 10*3/uL (ref 4.0–10.5)

## 2019-12-02 LAB — LIPID PANEL
Cholesterol: 186 mg/dL (ref 0–200)
HDL: 60.1 mg/dL (ref >39.00)
LDL Cholesterol: 94 mg/dL (ref 0–99)
NonHDL: 126.04
Total CHOL/HDL Ratio: 3
Triglycerides: 159 mg/dL — ABNORMAL HIGH (ref 0.0–149.0)
VLDL: 31.8 mg/dL (ref 0.0–40.0)

## 2019-12-08 ENCOUNTER — Telehealth: Payer: Self-pay

## 2019-12-08 NOTE — Telephone Encounter (Signed)
Ok -- recheck 6 months

## 2019-12-08 NOTE — Telephone Encounter (Signed)
-----   Message from [name], DO sent at 12/06/2019 11:20 AM EDT ----- Potassium Is low ---  is she taking 3 potassium tab daily?  If yes we will need to increase to 4 a day or change the diuretic

## 2019-12-09 DIAGNOSIS — M25551 Pain in right hip: Secondary | ICD-10-CM | POA: Diagnosis not present

## 2019-12-09 DIAGNOSIS — R262 Difficulty in walking, not elsewhere classified: Secondary | ICD-10-CM | POA: Diagnosis not present

## 2019-12-09 DIAGNOSIS — M25561 Pain in right knee: Secondary | ICD-10-CM | POA: Diagnosis not present

## 2019-12-09 NOTE — Telephone Encounter (Signed)
We were f/u with edema----- so she should keep it unless she is seeing cardiolgy about it

## 2019-12-09 NOTE — Telephone Encounter (Signed)
Pt has f/u scheduled for 06/08. Do you want her to keep?

## 2019-12-11 NOTE — Telephone Encounter (Signed)
Spoke with patient. Pt states wanting to keep appt on 12/15/19

## 2019-12-14 DIAGNOSIS — M25561 Pain in right knee: Secondary | ICD-10-CM | POA: Diagnosis not present

## 2019-12-14 DIAGNOSIS — M25551 Pain in right hip: Secondary | ICD-10-CM | POA: Diagnosis not present

## 2019-12-14 DIAGNOSIS — R262 Difficulty in walking, not elsewhere classified: Secondary | ICD-10-CM | POA: Diagnosis not present

## 2019-12-15 ENCOUNTER — Ambulatory Visit (INDEPENDENT_AMBULATORY_CARE_PROVIDER_SITE_OTHER): Payer: Medicare Other | Admitting: Family Medicine

## 2019-12-15 ENCOUNTER — Other Ambulatory Visit: Payer: Self-pay

## 2019-12-15 VITALS — BP 100/60 | HR 78 | Temp 97.0°F | Resp 18 | Ht 60.0 in | Wt 165.0 lb

## 2019-12-15 DIAGNOSIS — R600 Localized edema: Secondary | ICD-10-CM | POA: Diagnosis not present

## 2019-12-15 DIAGNOSIS — E876 Hypokalemia: Secondary | ICD-10-CM | POA: Diagnosis not present

## 2019-12-15 DIAGNOSIS — L03119 Cellulitis of unspecified part of limb: Secondary | ICD-10-CM

## 2019-12-15 DIAGNOSIS — R609 Edema, unspecified: Secondary | ICD-10-CM

## 2019-12-15 LAB — BASIC METABOLIC PANEL
BUN: 12 mg/dL (ref 6–23)
CO2: 32 mEq/L (ref 19–32)
Calcium: 8.8 mg/dL (ref 8.4–10.5)
Chloride: 96 mEq/L (ref 96–112)
Creatinine, Ser: 0.9 mg/dL (ref 0.40–1.20)
GFR: 61.02 mL/min (ref >60.00)
Glucose, Bld: 106 mg/dL — ABNORMAL HIGH (ref 70–99)
Potassium: 3.3 mEq/L — ABNORMAL LOW (ref 3.5–5.1)
Sodium: 137 mEq/L (ref 135–145)

## 2019-12-15 MED ORDER — FUROSEMIDE 40 MG PO TABS: 40.0000 mg | ORAL_TABLET | Freq: Two times a day (BID) | ORAL | 1 refills | Status: DC

## 2019-12-15 MED ORDER — POTASSIUM CHLORIDE CRYS ER 20 MEQ PO TBCR: 60.0000 meq | EXTENDED_RELEASE_TABLET | Freq: Every day | ORAL | 1 refills | Status: DC

## 2019-12-15 MED ORDER — CEPHALEXIN 500 MG PO CAPS: 500.0000 mg | ORAL_CAPSULE | Freq: Two times a day (BID) | ORAL | 0 refills | Status: DC

## 2019-12-15 NOTE — Patient Instructions (Signed)

## 2019-12-15 NOTE — Progress Notes (Signed)
Patient ID: Joan [name], female    DOB: [birth year]  Age: 74 y.o. MRN: [id number]    Subjective:  Subjective  HPI Joan [name] presents for f/u edema ---- it is better but her low legs are not red with sores from when they were swollen and red === warm to touch  Review of Systems  Constitutional: Negative for appetite change, diaphoresis, fatigue and unexpected weight change.  Eyes: Negative for pain, redness and visual disturbance.  Respiratory: Negative for cough, chest tightness, shortness of breath and wheezing.   Cardiovascular: Positive for leg swelling. Negative for chest pain and palpitations.  Endocrine: Negative for cold intolerance, heat intolerance, polydipsia, polyphagia and polyuria.  Genitourinary: Negative for difficulty urinating, dysuria and frequency.  Neurological: Negative for dizziness, light-headedness, numbness and headaches.    History Past Medical History:  Diagnosis Date  . Arthritis    "shoulders; wrist; probably spine" (06/24/2013)  . Chronic low back pain    followed by Dr Hardin Negus pain mgt  . Colon polyp   . Constipation   . Esophageal stricture   . Finger pain, left    2 fingers on left hand since wrist surgery  . GERD (gastroesophageal reflux disease)   . Heart murmur    "slight; not on RX" (06/24/2013)  . History of cardiac arrhythmia    cardiologist- Traci Turner  . Hx of colonic polyps 08/28/2004  . Hyperlipemia   . Hypertension   . Hypothyroidism   . Osteoarthritis of left knee    advanced  . PONV (postoperative nausea and vomiting)    Pt reports symptoms are the result of gallbladder and cholecystectomy, not anesthesia  . PVC's (premature ventricular contractions)   . Sleep apnea 1990's   "tested; tried mask; couldn't stand it; told me as long as I slept on my side I'd be ok" (06/24/2013)  . Thyroid nodule   . Tobacco abuse     She has a past surgical history that includes Lumbar fusion; Cesarean section (1972); Ankle  surgery (Left, 1995); Infusion pump implantation (1990's); Knee arthroscopy (Left, 1991; ~ 1993); Elbow surgery (1990's); Thyroidectomy (2012); Cervical spine surgery (2012); Foot surgery (Right, 2012); Shoulder arthroscopy (Left, 09/2011); Lumbar laminectomy/decompression microdiscectomy (N/A, 11/28/2012); Back surgery; Cholecystectomy (N/A, 04/16/2013); Laparoscopic lysis of adhesions (N/A, 04/16/2013); Posterior fusion lumbar spine (06/24/2013); Abdominal hysterectomy (1988); ORIF distal radius fracture (Left, 12/30/2013); ORIF wrist fracture (Left, 12/30/2013); Robotic assisted salpingo oophorectomy (Bilateral, 08/20/2014); Abdominal hysterectomy; Total knee arthroplasty (Right, 09/09/2017); Total hip arthroplasty (Left, 04/27/2019); and Total hip arthroplasty (Right, 09/09/2019).   Her family history includes Bone cancer in her sister; Breast cancer in her maternal aunt and sister; Cancer in her father; Coronary artery disease (age of onset: 37) in her mother; Diabetes in her mother and sister; Diabetes type II in her mother; Hypertension in her brother, mother, and sister; Pancreatic cancer in her father; Skin cancer in her mother; Thyroid cancer in her mother.She reports that she has been smoking cigarettes. She has a 75.00 pack-year smoking history. She has never used smokeless tobacco. She reports that she does not drink alcohol or use drugs.  Current Outpatient Medications on File Prior to Visit  Medication Sig Dispense Refill  . bisacodyl (DULCOLAX) 5 MG EC tablet Take 5-10 mg by mouth daily as needed for mild constipation.  30 tablet   . carisoprodol (SOMA) 350 MG tablet Take 175 mg by mouth 3 (three) times daily.     . dorzolamide-timolol (COSOPT) 22.3-6.8 MG/ML ophthalmic solution Place 1 drop  into the left eye 2 (two) times daily.    Marland Kitchen estradiol (ESTRACE) 0.5 MG tablet Take 0.5 mg by mouth at bedtime.   4  . hydrochlorothiazide (HYDRODIURIL) 25 MG tablet TAKE 1 TABLET BY MOUTH  DAILY (Patient taking  differently: Take 25 mg by mouth daily. ) 90 tablet 3  . levothyroxine (SYNTHROID) 88 MCG tablet Take 1 tablet (88 mcg total) by mouth as directed. 2 tablets on Sundays, 1 tablet Monday through Saturday 104 tablet 1  . oxyCODONE-acetaminophen (PERCOCET) 10-325 MG tablet Take 1 tablet by mouth every 4 (four) hours as needed for pain.     . simvastatin (ZOCOR) 20 MG tablet TAKE 1 TABLET BY MOUTH AT  BEDTIME (Patient taking differently: Take 20 mg by mouth at bedtime. ) 90 tablet 1  . traZODone (DESYREL) 50 MG tablet TAKE 1 TABLET BY MOUTH AT BEDTIME 90 tablet 0   No current facility-administered medications on file prior to visit.     Objective:  Objective  Physical Exam Vitals and nursing note reviewed.  Constitutional:      Appearance: She is well-developed.  HENT:     Head: Normocephalic and atraumatic.  Eyes:     Conjunctiva/sclera: Conjunctivae normal.  Neck:     Thyroid: No thyromegaly.     Vascular: No carotid bruit or JVD.  Cardiovascular:     Rate and Rhythm: Normal rate and regular rhythm.     Heart sounds: Normal heart sounds. No murmur.  Pulmonary:     Effort: Pulmonary effort is normal. No respiratory distress.     Breath sounds: Normal breath sounds. No wheezing or rales.  Chest:     Chest wall: No tenderness.  Musculoskeletal:     Cervical back: Normal range of motion and neck supple.     Right lower leg: Edema present.     Left lower leg: Edema present.  Skin:    General: Skin is warm.     Findings: Erythema and lesion present.       Neurological:     Mental Status: She is alert and oriented to person, place, and time.    BP 100/60 (BP Location: Right Arm, Patient Position: Sitting, Cuff Size: Large)   Pulse 78   Temp (!) 97 F (36.1 C) (Temporal)   Resp 18   Ht 5' (1.524 m)   Wt 165 lb (74.8 kg)   SpO2 95%   BMI 32.22 kg/m  Wt Readings from Last 3 Encounters:  12/15/19 165 lb (74.8 kg)  12/01/19 162 lb (73.5 kg)  10/29/19 168 lb 9.6 oz (76.5 kg)       Lab Results  Component Value Date   WBC 5.2 12/01/2019   HGB 12.8 12/01/2019   HCT 37.9 12/01/2019   PLT 206.0 12/01/2019   GLUCOSE 106 (H) 12/15/2019   CHOL 186 12/01/2019   TRIG 159.0 (H) 12/01/2019   HDL 60.10 12/01/2019   LDLDIRECT 106.0 11/29/2015   LDLCALC 94 12/01/2019   ALT 14 12/01/2019   AST 18 12/01/2019   NA 137 12/15/2019   K 3.3 (L) 12/15/2019   CL 96 12/15/2019   CREATININE 0.90 12/15/2019   BUN 12 12/15/2019   CO2 32 12/15/2019   TSH 1.04 10/21/2019   INR 1.0 09/03/2019   HGBA1C 6.0 12/30/2017    MM 3D SCREEN BREAST BILATERAL  Result Date: 10/23/2019 CLINICAL DATA:  Screening. EXAM: DIGITAL SCREENING BILATERAL MAMMOGRAM WITH TOMO AND CAD COMPARISON:  Previous exam(s). ACR Breast Density Category c: The  breast tissue is heterogeneously dense, which may obscure small masses. FINDINGS: There are no findings suspicious for malignancy. Images were processed with CAD. IMPRESSION: No mammographic evidence of malignancy. A result letter of this screening mammogram will be mailed directly to the patient. RECOMMENDATION: Screening mammogram in one year. (Code:SM-B-01Y) BI-RADS CATEGORY  1: Negative. Electronically Signed   By: Abelardo Diesel M.D.   On: 10/23/2019 13:55     Assessment & Plan:  Plan  I have changed Olen Cordial. Delany's potassium chloride SA. I am also having her start on cephALEXin. Additionally, I am having her maintain her carisoprodol, bisacodyl, dorzolamide-timolol, estradiol, oxyCODONE-acetaminophen, simvastatin, hydrochlorothiazide, levothyroxine, traZODone, and furosemide.  Meds ordered this encounter  Medications  . furosemide (LASIX) 40 MG tablet    Sig: Take 1 tablet (40 mg total) by mouth 2 (two) times daily.    Dispense:  180 tablet    Refill:  1  . potassium chloride SA (KLOR-CON) 20 MEQ tablet    Sig: Take 3 tablets (60 mEq total) by mouth daily. Take 4 tablets by mouth daily    Dispense:  360 tablet    Refill:  1    Requesting 1  year supply  . cephALEXin (KEFLEX) 500 MG capsule    Sig: Take 1 capsule (500 mg total) by mouth 2 (two) times daily.    Dispense:  20 capsule    Refill:  0    Problem List Items Addressed This Visit      Unprioritized   Cellulitis of lower extremity    abx per orders F/u prn       Relevant Medications   cephALEXin (KEFLEX) 500 MG capsule   Edema    Improved with diuretics F/u cardiology      Hypokalemia - Primary    Repeat bmp con't kcl       Relevant Medications   potassium chloride SA (KLOR-CON) 20 MEQ tablet   Other Relevant Orders   Basic metabolic panel (Completed)   Lower extremity edema   Relevant Medications   furosemide (LASIX) 40 MG tablet      Follow-up: Return in about 3 months (around 03/16/2020), or if symptoms worsen or fail to improve.  [name], DO

## 2019-12-16 ENCOUNTER — Encounter: Payer: Self-pay | Admitting: Family Medicine

## 2019-12-16 ENCOUNTER — Telehealth: Payer: Self-pay | Admitting: Cardiovascular Disease

## 2019-12-16 DIAGNOSIS — R0602 Shortness of breath: Secondary | ICD-10-CM

## 2019-12-16 DIAGNOSIS — R600 Localized edema: Secondary | ICD-10-CM | POA: Insufficient documentation

## 2019-12-16 DIAGNOSIS — I872 Venous insufficiency (chronic) (peripheral): Secondary | ICD-10-CM

## 2019-12-16 NOTE — Telephone Encounter (Signed)
New message   Pt c/o swelling: STAT is pt has developed SOB within 24 hours  How much weight have you gained and in what time span? For about 5 weeks, no weight gain  1) If swelling, where is the swelling located? Both lower legs and feet   2) Are you currently taking a fluid pill? Yes   Are you currently SOB? No   3) Do you have a log of your daily weights (if so, list)?no   Have you gained 3 pounds in a day or 5 pounds in a week? No   4) Have you traveled recently? No

## 2019-12-16 NOTE — Telephone Encounter (Signed)
Spoke with patient. Patient has had swelling in her legs and feet for about 5 weeks. The swelling is from her feet to her knees. Patient's socks and shoes are not tighter than usual and pants are not tighter either. Patient does not weigh self daily. She tries to elevate her legs when sitting and she wears her compression stockings on and off. Patient has not been taking lasix as prescribed because it causes dizziness in the afternoon. She says it makes her blood pressure run low but she doesn't check it at home. She knows it's low because when she went to her PCP it was 100/60. She does have a blood pressure cuff she can use at her house.   Advised patient to take her lasix as prescribed, elevate lower extremities, wear compression stockings, weigh daily at the same time in the same way and to monitor blood pressure daily to help [place] assess whether the medication is affecting her blood pressure. Patient should keep a log of her weights and call back if she gains 3lbs in a night or 5lbs in a week. Patient states "do I need an appointment with the doctor or not? Dr. Etter Sjogren said I may need an echo to see if I have fluid around my heart." Appointment made for 12/29/2019 with Dr. Audie Box.

## 2019-12-16 NOTE — Assessment & Plan Note (Signed)
Improved with diuretics F/u cardiology

## 2019-12-16 NOTE — Assessment & Plan Note (Signed)
abx per orders F/u prn

## 2019-12-16 NOTE — Assessment & Plan Note (Signed)
Repeat bmp con't kcl

## 2019-12-16 NOTE — Telephone Encounter (Signed)
She has bad venous insufficiency. Lets go ahead and repeat an echo before she sees me.   [place] T. Audie Box, [place]  [address] [place], [place] [address] [phone]  6:42 PM

## 2019-12-16 NOTE — Telephone Encounter (Signed)
Left message for patient to call back  

## 2019-12-17 ENCOUNTER — Telehealth: Payer: Self-pay | Admitting: *Deleted

## 2019-12-17 DIAGNOSIS — H401121 Primary open-angle glaucoma, left eye, mild stage: Secondary | ICD-10-CM | POA: Diagnosis not present

## 2019-12-17 NOTE — Telephone Encounter (Signed)
Spoke with patient regarding appointment scheduled Friday 12/25/19 T 11:00 am at [facility] for the Echo ordered by Dr. Laury Axon time is 10:15 am at entrance C.  Patient voiced her understanding.

## 2019-12-17 NOTE — Telephone Encounter (Signed)
Order placed for echo and message sent to scheduling. Patient notified of new orders and that someone would be calling to get echocardiogram set up. Patient verbalized understanding.

## 2019-12-21 DIAGNOSIS — M25561 Pain in right knee: Secondary | ICD-10-CM | POA: Diagnosis not present

## 2019-12-21 DIAGNOSIS — R262 Difficulty in walking, not elsewhere classified: Secondary | ICD-10-CM | POA: Diagnosis not present

## 2019-12-21 DIAGNOSIS — M25551 Pain in right hip: Secondary | ICD-10-CM | POA: Diagnosis not present

## 2019-12-23 DIAGNOSIS — M25551 Pain in right hip: Secondary | ICD-10-CM | POA: Diagnosis not present

## 2019-12-23 DIAGNOSIS — R262 Difficulty in walking, not elsewhere classified: Secondary | ICD-10-CM | POA: Diagnosis not present

## 2019-12-23 DIAGNOSIS — M25561 Pain in right knee: Secondary | ICD-10-CM | POA: Diagnosis not present

## 2019-12-24 DIAGNOSIS — Z79891 Long term (current) use of opiate analgesic: Secondary | ICD-10-CM | POA: Diagnosis not present

## 2019-12-24 DIAGNOSIS — G894 Chronic pain syndrome: Secondary | ICD-10-CM | POA: Diagnosis not present

## 2019-12-24 DIAGNOSIS — M62830 Muscle spasm of back: Secondary | ICD-10-CM | POA: Diagnosis not present

## 2019-12-24 DIAGNOSIS — M961 Postlaminectomy syndrome, not elsewhere classified: Secondary | ICD-10-CM | POA: Diagnosis not present

## 2019-12-25 ENCOUNTER — Ambulatory Visit (HOSPITAL_COMMUNITY)
Admission: RE | Admit: 2019-12-25 | Discharge: 2019-12-25 | Disposition: A | Discharge status: Home / Self Care | Payer: Medicare Other | Source: Ambulatory Visit | Attending: Cardiovascular Disease | Admitting: Cardiovascular Disease

## 2019-12-25 ENCOUNTER — Other Ambulatory Visit: Payer: Self-pay

## 2019-12-25 DIAGNOSIS — F17200 Nicotine dependence, unspecified, uncomplicated: Secondary | ICD-10-CM | POA: Insufficient documentation

## 2019-12-25 DIAGNOSIS — I493 Ventricular premature depolarization: Secondary | ICD-10-CM | POA: Insufficient documentation

## 2019-12-25 DIAGNOSIS — E785 Hyperlipidemia, unspecified: Secondary | ICD-10-CM | POA: Insufficient documentation

## 2019-12-25 DIAGNOSIS — I10 Essential (primary) hypertension: Secondary | ICD-10-CM | POA: Insufficient documentation

## 2019-12-25 DIAGNOSIS — I872 Venous insufficiency (chronic) (peripheral): Secondary | ICD-10-CM

## 2019-12-25 DIAGNOSIS — R0602 Shortness of breath: Secondary | ICD-10-CM | POA: Insufficient documentation

## 2019-12-25 NOTE — Progress Notes (Signed)
  Echocardiogram 2D Echocardiogram has been performed.  Joan [name] 12/25/2019, 11:46 AM

## 2019-12-27 NOTE — Progress Notes (Signed)
Cardiology Office Note:   Date:  12/29/2019  NAME:  Joan [name]    MRN: [id number] DOB:  1946/03/12   PCP:  Joan Held, DO  Cardiologist:  Joan Him, MD  Electrophysiologist:  None   Referring MD: Joan [name], Joan [name], *   Chief Complaint  Patient presents with  . Edema    BILATERAL LEG/FEET  . Shortness of Breath   History of Present Illness:   Joan [name] is a 74 y.o. female with a hx of venous insufficiency, tobacco abuse, HLD who presents for follow-up of LE edema. Evaluated in May and LE edema consistent with venous insufficiency. BNP was normal. Echo repeat shows normal LVEF on my review without WMA.  She reports she has had worsening lower extremity edema and shortness of breath.  She is apparently had pretty significant weight loss over the past few weeks.  This is coincided with her being diagnosed with hyperthyroid state.  Her primary care physician is backing off on her Synthroid.  She is taking Lasix 40 mg twice daily.  She reports this does help with the lower extremity edema.  She has known venous insufficiency.  Her most recent BNP was 13.  Her echocardiogram is basically unchanged and normal.  She has no evidence of right heart failure on her echocardiogram and I suspect her lower extreme edema is all related to venous insufficiency.  She does have a considerable smoking history and has never been tested for COPD.  I think this is the next step as she has rhonchi on exam and a very significant smoking history.  She denies chest pain today.  Symptoms are mainly lower extremity edema and shortness of breath.  Problem List 1. HTN 2. Tobacco abuse -50 pack year history  3. Venous insufficiency  -negative DVT study 03/2019 -BNP 13 -EF 55-60% no WMA on my review  4.  Hyperlipidemia -Total cholesterol 171, HDL 62, LDL 87, triglycerides 104  Past Medical History: Past Medical History:  Diagnosis Date  . Arthritis    "shoulders; wrist; probably  spine" (06/24/2013)  . Chronic low back pain    followed by Dr Hardin Negus pain mgt  . Colon polyp   . Constipation   . Esophageal stricture   . Finger pain, left    2 fingers on left hand since wrist surgery  . GERD (gastroesophageal reflux disease)   . Heart murmur    "slight; not on RX" (06/24/2013)  . History of cardiac arrhythmia    cardiologist- Traci Turner  . Hx of colonic polyps 08/28/2004  . Hyperlipemia   . Hypertension   . Hypothyroidism   . Osteoarthritis of left knee    advanced  . PONV (postoperative nausea and vomiting)    Pt reports symptoms are the result of gallbladder and cholecystectomy, not anesthesia  . PVC's (premature ventricular contractions)   . Sleep apnea 1990's   "tested; tried mask; couldn't stand it; told me as long as I slept on my side I'd be ok" (06/24/2013)  . Thyroid nodule   . Tobacco abuse     Past Surgical History: Past Surgical History:  Procedure Laterality Date  . ABDOMINAL HYSTERECTOMY  1988   "partial" (06/24/2013)  . ABDOMINAL HYSTERECTOMY     partial in 1988  . ANKLE SURGERY Left 1995   "tendon repair" (06/24/2013)  . BACK SURGERY     "think today was my 8th back OR" (06/24/2013)  . CERVICAL SPINE SURGERY  2012  .  [place]  . CHOLECYSTECTOMY N/A 04/16/2013   Procedure: LAPAROSCOPIC CHOLECYSTECTOMY ;  Surgeon: Imogene Burn. Georgette Dover, MD;  Location: [facility];  Service: General;  Laterality: N/A;  . ELBOW SURGERY  1990's  . FOOT SURGERY Right 2012   SPUR REMOVED  . INFUSION PUMP IMPLANTATION  1990's   "implantablet morphine pump; took it out w/in 11 months  . KNEE ARTHROSCOPY Left 1991; ~ 1993  . LAPAROSCOPIC LYSIS OF ADHESIONS N/A 04/16/2013   Procedure: LAPAROSCOPIC LYSIS OF ADHESIONS;  Surgeon: Imogene Burn. Georgette Dover, MD;  Location: [facility];  Service: General;  Laterality: N/A;  . LUMBAR FUSION     and rods  . LUMBAR LAMINECTOMY/DECOMPRESSION MICRODISCECTOMY N/A 11/28/2012   Procedure: DECOMPRESSIVE LUMBAR LAMINECTOMY LEVEL  1;  Surgeon: Elaina Hoops, MD;  Location: [facility];  Service: Neurosurgery;  Laterality: N/A;  DECOMPRESSIVE LUMBAR LAMINECTOMY LEVEL 1  . ORIF DISTAL RADIUS FRACTURE Left 12/30/2013   dr Caralyn Guile  . ORIF WRIST FRACTURE Left 12/30/2013   Procedure: OPEN REDUCTION INTERNAL FIXATION (ORIF) LEFT WRIST FRACTURE AND REPAIR AS INDICATED;  Surgeon: Linna Hoff, MD;  Location: [facility];  Service: Orthopedics;  Laterality: Left;  . POSTERIOR FUSION LUMBAR SPINE  06/24/2013  . ROBOTIC ASSISTED SALPINGO OOPHERECTOMY Bilateral 08/20/2014   Procedure: ROBOTIC ASSISTED SALPINGO OOPHORECTOMY;  Surgeon: Daria Pastures, MD;  Location: [facility];  Service: Gynecology;  Laterality: Bilateral;  . SHOULDER ARTHROSCOPY Left 09/2011  . THYROIDECTOMY  2012  . TOTAL HIP ARTHROPLASTY Left 04/27/2019   Procedure: TOTAL HIP ARTHROPLASTY ANTERIOR APPROACH;  Surgeon: Gaynelle Arabian, MD;  Location: [facility];  Service: Orthopedics;  Laterality: Left;  135min  . TOTAL HIP ARTHROPLASTY Right 09/09/2019   Procedure: TOTAL HIP ARTHROPLASTY ANTERIOR APPROACH;  Surgeon: Gaynelle Arabian, MD;  Location: [facility];  Service: Orthopedics;  Laterality: Right;  149min  . TOTAL KNEE ARTHROPLASTY Right 09/09/2017   Procedure: RIGHT TOTAL KNEE ARTHROPLASTY;  Surgeon: Gaynelle Arabian, MD;  Location: [facility];  Service: Orthopedics;  Laterality: Right;    Current Medications: Current Meds  Medication Sig  . bisacodyl (DULCOLAX) 5 MG EC tablet Take 5-10 mg by mouth daily as needed for mild constipation.   . carisoprodol (SOMA) 350 MG tablet Take 175 mg by mouth 3 (three) times daily.   . cephALEXin (KEFLEX) 500 MG capsule Take 1 capsule (500 mg total) by mouth 2 (two) times daily.  . dorzolamide-timolol (COSOPT) 22.3-6.8 MG/ML ophthalmic solution Place 1 drop into the left eye 2 (two) times daily.  Marland Kitchen estradiol (ESTRACE) 0.5 MG tablet Take 0.5 mg by mouth at bedtime.   . furosemide (LASIX) 40 MG tablet Take 1 tablet (40 mg total) by mouth 2 (two) times  daily.  . hydrochlorothiazide (HYDRODIURIL) 25 MG tablet TAKE 1 TABLET BY MOUTH  DAILY (Patient taking differently: Take 25 mg by mouth daily. )  . levothyroxine (SYNTHROID) 88 MCG tablet Take 1 tablet (88 mcg total) by mouth as directed. 1.5 tablets on Sundays, 1 tablet Monday through Saturday  . oxyCODONE-acetaminophen (PERCOCET) 10-325 MG tablet Take 1 tablet by mouth every 4 (four) hours as needed for pain.   . potassium chloride SA (KLOR-CON) 20 MEQ tablet Take 3 tablets (60 mEq total) by mouth daily. Take 4 tablets by mouth daily  . simvastatin (ZOCOR) 20 MG tablet TAKE 1 TABLET BY MOUTH AT  BEDTIME (Patient taking differently: Take 20 mg by mouth at bedtime. )  . traZODone (DESYREL) 50 MG tablet TAKE 1 TABLET BY MOUTH AT  BEDTIME     Allergies:    Morphine and related, 5-alpha reductase inhibitors, Amitriptyline, Gabapentin, and Triamterene   Social History: Social History   Socioeconomic History  . Marital status: Divorced    Spouse name: Not on file  . Number of children: Not on file  . Years of education: Not on file  . Highest education level: Not on file  Occupational History  . Not on file  Tobacco Use  . Smoking status: Current Every Day Smoker    Packs/day: 1.50    Years: 50.00    Pack years: 75.00    Types: Cigarettes  . Smokeless tobacco: Never Used  Vaping Use  . Vaping Use: Never used  Substance and Sexual Activity  . Alcohol use: No    Alcohol/week: 0.0 standard drinks  . Drug use: No  . Sexual activity: Not Currently  Other Topics Concern  . Not on file  Social History Narrative   Divorced   Current Smoker  1 ppd -  20 yrs      Alcohol use-no       [employer] - laid off       Physician roster:   Dr. Elta Guadeloupe Philips - pain management   Dr. Philis Pique - GYN   Dr. Saintclair Halsted - Neurosurgery   Dr. Ronnald Ramp - dermatology   Dr. Caralyn Guile - orthopedics   Dr. Fransico [name] - cardiology   Dr. Miller-ophthalmology   Social Determinants of Health    Financial Resource Strain:   . Difficulty of Paying Living Expenses:   Food Insecurity:   . Worried About Charity fundraiser in the Last Year:   . Arboriculturist in the Last Year:   Transportation Needs:   . Film/video editor (Medical):   Marland Kitchen Lack of Transportation (Non-Medical):   Physical Activity:   . Days of Exercise per Week:   . Minutes of Exercise per Session:   Stress:   . Feeling of Stress :   Social Connections:   . Frequency of Communication with Friends and Family:   . Frequency of Social Gatherings with Friends and Family:   . Attends Religious Services:   . Active Member of Clubs or Organizations:   . Attends Archivist Meetings:   Marland Kitchen Marital Status:      Family History: The patient's family history includes Bone cancer in her sister; Breast cancer in her maternal aunt and sister; Cancer in her father; Coronary artery disease (age of onset: 6) in her mother; Diabetes in her mother and sister; Diabetes type II in her mother; Hypertension in her brother, mother, and sister; Pancreatic cancer in her father; Skin cancer in her mother; Thyroid cancer in her mother. There is no history of Other.  ROS:   All other ROS reviewed and negative. Pertinent positives noted in the HPI.     EKGs/Labs/Other Studies Reviewed:   The following studies were personally reviewed by me today:  TTE 12/25/2019  1. MIld inferior basal hypokinesis Prominent epicaridal fat. Left  ventricular ejection fraction, by estimation, is 50 to 55%. The left  ventricle has low normal function. The left ventricle demonstrates  regional wall motion abnormalities (see scoring  diagram/findings for description). Left ventricular diastolic parameters  are consistent with Grade I diastolic dysfunction (impaired relaxation).  2. Right ventricular systolic function is normal. The right ventricular  size is normal.  3. The mitral valve is normal in structure. No evidence of mitral valve   regurgitation.  No evidence of mitral stenosis.  4. The aortic valve was not well visualized. Aortic valve regurgitation  is not visualized. Mild aortic valve sclerosis is present, with no  evidence of aortic valve stenosis.  5. The inferior vena cava is normal in size with greater than 50%  respiratory variability, suggesting right atrial pressure of 3 mmHg.   Recent Labs: 03/12/2019: BNP 13.0 12/01/2019: ALT 14; Hemoglobin 12.8; Platelets 206.0 12/15/2019: BUN 12; Creatinine, Ser 0.90; Potassium 3.3; Sodium 137 12/28/2019: TSH 0.18   Recent Lipid Panel    Component Value Date/Time   CHOL 186 12/01/2019 1459   TRIG 159.0 (H) 12/01/2019 1459   HDL 60.10 12/01/2019 1459   CHOLHDL 3 12/01/2019 1459   VLDL 31.8 12/01/2019 1459   LDLCALC 94 12/01/2019 1459   LDLDIRECT 106.0 11/29/2015 1515    Physical Exam:   VS:  BP 124/80 (BP Location: Left Arm, Patient Position: Sitting, Cuff Size: Normal)   Pulse 86   Temp 97.8 F (36.6 C)   Ht 5' (1.524 m)   Wt 161 lb (73 kg)   SpO2 94%   BMI 31.44 kg/m    Wt Readings from Last 3 Encounters:  12/29/19 161 lb (73 kg)  12/28/19 161 lb 6.4 oz (73.2 kg)  12/15/19 165 lb (74.8 kg)    General: Well nourished, well developed, in no acute distress Heart: Atraumatic, normal size  Eyes: PEERLA, EOMI  Neck: Supple, no JVD Endocrine: No thryomegaly Cardiac: Normal S1, S2; RRR; no murmurs, rubs, or gallops Lungs: Diffuse rhonchi bilaterally Abd: Soft, nontender, no hepatomegaly  Ext: Venous insufficiency changes noted on exam, 1+ pitting edema Musculoskeletal: No deformities, BUE and BLE strength normal and equal Skin: Warm and dry, no rashes   Neuro: Alert and oriented to person, place, time, and situation, CNII-XII grossly intact, no focal deficits  Psych: Normal mood and affect   ASSESSMENT:   RHYLIE STEHR is a 74 y.o. female who presents for the following: 1. Shortness of breath   2. Leg edema   3. Venous insufficiency   4.  Essential hypertension   5. Hyperlipidemia, unspecified hyperlipidemia type     PLAN:   1. Shortness of breath -She has noticeable rhonchi on examination.  She gets short of breath with exertion.  She has a 50-pack-year history.  I highly suspect she has underlying COPD.  I would like to get a chest x-ray and pulmonary function testing.  We will go ahead and start her on albuterol as needed.  Her BNP was normal on last examination.  We will go ahead and repeat that today.  Her overall physical exam is inconsistent with congestive heart failure.  She does have venous insufficiency lower extremity edema but this is not related to elevated right heart pressure.  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  She will continue her Lasix 40 mg twice daily for now.  This is also coincided with weight loss and hypothyroid state.  I do wonder how much of this is related to her high thyroid state.  Her primary care physician has changed medications on this.  2. Leg edema 3. Venous insufficiency -She does have venous insufficiency on examination today.  Symptoms are more consistent with venous insufficiency versus congestive heart failure.  We will recheck a BMP today.  I did review her echocardiogram which is normal in my opinion.  It is basically unchanged from previously and she has no evidence of elevated right heart pressure.  She  will continue with compression stockings and Lasix.  4. Essential hypertension -Well-controlled.  No change in medication.  5. Hyperlipidemia, unspecified hyperlipidemia type -Continue Zocor.   Disposition: Return in about 3 months (around 03/30/2020).  Medication Adjustments/Labs and Tests Ordered: Current medicines are reviewed at length with the patient today.  Concerns regarding medicines are outlined above.  Orders Placed This Encounter  Procedures  . DG Chest 2 View  . Brain natriuretic peptide  . Basic metabolic panel  . Pulmonary function test   Meds  ordered this encounter  Medications  . albuterol (VENTOLIN HFA) 108 (90 Base) MCG/ACT inhaler    Sig: Inhale 2 puffs into the lungs every 6 (six) hours as needed for wheezing or shortness of breath.    Dispense:  6.7 g    Refill:  2    Patient Instructions  Medication Instructions:  Start Albuterol inhaler use every 6 hours as needed.   *If you need a refill on your cardiac medications before your next appointment, please call your pharmacy*   Lab Work: BMET, BNP today   If you have labs (blood work) drawn today and your tests are completely normal, you will receive your results only by: Marland Kitchen MyChart Message (if you have MyChart) OR . A paper copy in the mail If you have any lab test that is abnormal or we need to change your treatment, we will call you to review the results.   Testing/Procedures: Your physician has recommended that you have a pulmonary function test. Pulmonary Function Tests are a group of tests that measure how well air moves in and out of your lungs.  Chest xray - Your physician has requested that you have a chest xray, is a fast and painless imaging test that uses certain electromagnetic waves to create pictures of the structures in and around your chest. This test can help diagnose and monitor conditions such as pneumonia and other lung issues his will be done at [facility] [place], [place]. If you should need to call them their phone number is [phone].    Follow-Up: At [facility], you and your health needs are our priority.  As part of our continuing mission to provide you with exceptional heart care, we have created designated Provider Care Teams.  These Care Teams include your primary Cardiologist (physician) and Advanced Practice Providers (APPs -  Physician Assistants and Nurse Practitioners) who all work together to provide you with the care you need, when you need it.  We recommend signing up for the patient portal called  "MyChart".  Sign up information is provided on this After Visit Summary.  MyChart is used to connect with patients for Virtual Visits (Telemedicine).  Patients are able to view lab/test results, encounter notes, upcoming appointments, etc.  Non-urgent messages can be sent to your provider as well.   To learn more about what you can do with MyChart, go to [email or web address].    Your next appointment:   3 month(s)  The format for your next appointment:   In Person  Provider:   Eleonore Chiquito, MD         Time Spent with Patient: I have spent a total of 35 minutes with patient reviewing hospital notes, telemetry, EKGs, labs and examining the patient as well as establishing an assessment and plan that was discussed with the patient.  > 50% of time was spent in direct patient care.  Signed, Addison Naegeli. Audie Box, [place]  [address], [place], [place] [address] [phone]  12/29/2019 3:37 PM

## 2019-12-28 ENCOUNTER — Ambulatory Visit: Payer: Medicare Other | Admitting: Internal Medicine

## 2019-12-28 ENCOUNTER — Other Ambulatory Visit: Payer: Self-pay

## 2019-12-28 ENCOUNTER — Encounter: Payer: Self-pay | Admitting: Internal Medicine

## 2019-12-28 VITALS — BP 104/60 | HR 98 | Ht 60.0 in | Wt 161.4 lb

## 2019-12-28 DIAGNOSIS — E890 Postprocedural hypothyroidism: Secondary | ICD-10-CM

## 2019-12-28 LAB — TSH: TSH: 0.18 u[IU]/mL — ABNORMAL LOW (ref 0.35–4.50)

## 2019-12-28 LAB — T4, FREE: Free T4: 1.87 ng/dL — ABNORMAL HIGH (ref 0.60–1.60)

## 2019-12-28 MED ORDER — LEVOTHYROXINE SODIUM 88 MCG PO TABS: 88.0000 ug | ORAL_TABLET | ORAL | 1 refills | Status: DC

## 2019-12-28 NOTE — Patient Instructions (Signed)

## 2019-12-28 NOTE — Progress Notes (Signed)
Name: Joan [name]  MRN/ DOB: [id number], Sep 27, 1945    Age/ Sex: 74 y.o., female     PCP: Carollee Herter, Alferd Apa, DO   Reason for Endocrinology Evaluation: Hypothyroidism     Initial Endocrinology Clinic Visit: 08/27/2019    PATIENT IDENTIFIER: Joan [name] is a 74 y.o., female with a past medical history of HTN and hypothyroidism. She has followed with [facility] since 08/27/2019 for consultative assistance with management of her Hypothyroidism   HISTORICAL SUMMARY:    She has been diagnosed with left thyroid nodule in 2010 . She is S/P FNA on the left nodule in 2012 with atypia. She is s/p total thyroidectomy .  Has been on LT-4 replacement since 1982 , her dose has been gradually reduced SUBJECTIVE:    Today (12/28/2019):  [name] is here for a follow up on post-operative hypothyroidism.   She is on Levothyroxine 88 mcg , 2 tabs on Sundays and 1 tab rest of the week    Pt has been noted with weight loss   No MVI or Biotin   Has to increase lasix due to  Has chronic constipation due to    ROS:  As per HPI.   HISTORY:  Past Medical History:  Past Medical History:  Diagnosis Date  . Arthritis    "shoulders; wrist; probably spine" (06/24/2013)  . Chronic low back pain    followed by Dr Hardin Negus pain mgt  . Colon polyp   . Constipation   . Esophageal stricture   . Finger pain, left    2 fingers on left hand since wrist surgery  . GERD (gastroesophageal reflux disease)   . Heart murmur    "slight; not on RX" (06/24/2013)  . History of cardiac arrhythmia    cardiologist- Traci Turner  . Hx of colonic polyps 08/28/2004  . Hyperlipemia   . Hypertension   . Hypothyroidism   . Osteoarthritis of left knee    advanced  . PONV (postoperative nausea and vomiting)    Pt reports symptoms are the result of gallbladder and cholecystectomy, not anesthesia  . PVC's (premature ventricular contractions)   . Sleep apnea 1990's   "tested;  tried mask; couldn't stand it; told me as long as I slept on my side I'd be ok" (06/24/2013)  . Thyroid nodule   . Tobacco abuse    Past Surgical History:  Past Surgical History:  Procedure Laterality Date  . ABDOMINAL HYSTERECTOMY  1988   "partial" (06/24/2013)  . ABDOMINAL HYSTERECTOMY     partial in 1988  . ANKLE SURGERY Left 1995   "tendon repair" (06/24/2013)  . BACK SURGERY     "think today was my 8th back OR" (06/24/2013)  . CERVICAL SPINE SURGERY  2012  . [place]  . CHOLECYSTECTOMY N/A 04/16/2013   Procedure: LAPAROSCOPIC CHOLECYSTECTOMY ;  Surgeon: Imogene Burn. Georgette Dover, MD;  Location: [facility];  Service: General;  Laterality: N/A;  . ELBOW SURGERY  1990's  . FOOT SURGERY Right 2012   SPUR REMOVED  . INFUSION PUMP IMPLANTATION  1990's   "implantablet morphine pump; took it out w/in 11 months  . KNEE ARTHROSCOPY Left 1991; ~ 1993  . LAPAROSCOPIC LYSIS OF ADHESIONS N/A 04/16/2013   Procedure: LAPAROSCOPIC LYSIS OF ADHESIONS;  Surgeon: Imogene Burn. Georgette Dover, MD;  Location: [facility];  Service: General;  Laterality: N/A;  . LUMBAR FUSION     and rods  . LUMBAR LAMINECTOMY/DECOMPRESSION MICRODISCECTOMY N/A 11/28/2012  Procedure: DECOMPRESSIVE LUMBAR LAMINECTOMY LEVEL 1;  Surgeon: Elaina Hoops, MD;  Location: [facility];  Service: Neurosurgery;  Laterality: N/A;  DECOMPRESSIVE LUMBAR LAMINECTOMY LEVEL 1  . ORIF DISTAL RADIUS FRACTURE Left 12/30/2013   dr Caralyn Guile  . ORIF WRIST FRACTURE Left 12/30/2013   Procedure: OPEN REDUCTION INTERNAL FIXATION (ORIF) LEFT WRIST FRACTURE AND REPAIR AS INDICATED;  Surgeon: Linna Hoff, MD;  Location: [facility];  Service: Orthopedics;  Laterality: Left;  . POSTERIOR FUSION LUMBAR SPINE  06/24/2013  . ROBOTIC ASSISTED SALPINGO OOPHERECTOMY Bilateral 08/20/2014   Procedure: ROBOTIC ASSISTED SALPINGO OOPHORECTOMY;  Surgeon: Daria Pastures, MD;  Location: [facility];  Service: Gynecology;  Laterality: Bilateral;  . SHOULDER ARTHROSCOPY Left 09/2011  .  THYROIDECTOMY  2012  . TOTAL HIP ARTHROPLASTY Left 04/27/2019   Procedure: TOTAL HIP ARTHROPLASTY ANTERIOR APPROACH;  Surgeon: Gaynelle Arabian, MD;  [facility];  Service: Orthopedics;  Laterality: Left;  176min  . TOTAL HIP ARTHROPLASTY Right 09/09/2019   Procedure: TOTAL HIP ARTHROPLASTY ANTERIOR APPROACH;  Surgeon: Gaynelle Arabian, MD;  [facility];  Service: Orthopedics;  Laterality: Right;  139min  . TOTAL KNEE ARTHROPLASTY Right 09/09/2017   Procedure: RIGHT TOTAL KNEE ARTHROPLASTY;  Surgeon: Gaynelle Arabian, MD;  [facility];  Service: Orthopedics;  Laterality: Right;    Social History:  reports that she has been smoking cigarettes. She has a 75.00 pack-year smoking history. She has never used smokeless tobacco. She reports that she does not drink alcohol and does not use drugs. Family History:  Family History  Problem Relation Age of Onset  . Pancreatic cancer Father   . Cancer Father   . Diabetes type II Mother   . Hypertension Mother   . Coronary artery disease Mother 68  . Diabetes Mother   . Thyroid cancer Mother   . Skin cancer Mother   . Diabetes Sister   . Hypertension Sister   . Bone cancer Sister   . Breast cancer Sister   . Hypertension Brother   . Breast cancer Maternal Aunt   . Other Neg Hx        No family history of  colon cancer     HOME MEDICATIONS: Allergies as of 12/28/2019      Reactions   Morphine And Related Swelling   5-alpha Reductase Inhibitors    Amitriptyline Swelling   Leg swelling   Gabapentin Itching   Triamterene Itching, Rash      Medication List       Accurate as of December 28, 2019  1:54 PM. If you have any questions, ask your nurse or doctor.        bisacodyl 5 MG EC tablet Commonly known as: DULCOLAX Take 5-10 mg by mouth daily as needed for mild constipation.   carisoprodol 350 MG tablet Commonly known as: SOMA Take 175 mg by mouth 3 (three) times daily.   cephALEXin 500 MG capsule Commonly known as: KEFLEX  Take 1 capsule (500 mg total) by mouth 2 (two) times daily.   dorzolamide-timolol 22.3-6.8 MG/ML ophthalmic solution Commonly known as: COSOPT Place 1 drop into the left eye 2 (two) times daily.   estradiol 0.5 MG tablet Commonly known as: ESTRACE Take 0.5 mg by mouth at bedtime.   furosemide 40 MG tablet Commonly known as: LASIX Take 1 tablet (40 mg total) by mouth 2 (two) times daily.   hydrochlorothiazide 25 MG tablet Commonly known as: HYDRODIURIL TAKE 1 TABLET BY MOUTH  DAILY   levothyroxine  88 MCG tablet Commonly known as: SYNTHROID Take 1 tablet (88 mcg total) by mouth as directed. 2 tablets on Sundays, 1 tablet Monday through Saturday   oxyCODONE-acetaminophen 10-325 MG tablet Commonly known as: PERCOCET Take 1 tablet by mouth every 4 (four) hours as needed for pain.   potassium chloride SA 20 MEQ tablet Commonly known as: KLOR-CON Take 3 tablets (60 mEq total) by mouth daily. Take 4 tablets by mouth daily   simvastatin 20 MG tablet Commonly known as: ZOCOR TAKE 1 TABLET BY MOUTH AT  BEDTIME   traZODone 50 MG tablet Commonly known as: DESYREL TAKE 1 TABLET BY MOUTH AT BEDTIME         OBJECTIVE:   PHYSICAL EXAM: VS: BP 104/60   Pulse 98   Ht 5' (1.524 m)   Wt 161 lb 6.4 oz (73.2 kg)   SpO2 96%   BMI 31.52 kg/m    EXAM: General: Pt appears well and is in NAD  Neck: General: Supple without adenopathy. Thyroid: Surgically removed. No goiter or nodules appreciated.   Lungs: Clear with good BS bilat with no rales, rhonchi, or wheezes  Heart: Auscultation: RRR.  Extremities:  BL LE: Trace pretibial edema normal ROM and strength.  Mental Status: Judgment, insight: Intact Orientation: Oriented to time, place, and person Mood and affect: No depression, anxiety, or agitation     DATA REVIEWED: Results for KAROL, SKARZYNSKI (MRN [id number]) as of 12/28/2019 16:21  Ref. Range 12/28/2019 14:02  TSH Latest Ref Range: 0.35 - 4.50 uIU/mL 0.18 (L)   T4,Free(Direct) Latest Ref Range: 0.60 - 1.60 ng/dL 1.87 (H)      ASSESSMENT / PLAN / RECOMMENDATIONS:   1. Postsurgical Hypothyroidism:   - Pt with fatigue  - No local neck symptoms  - TFTs show low TSH, will reduce the dose as below     Medications  Levothyroxine 88 mcg, 1.5 tabs on Sunday and 1 tab rest of the week     F/U in 4 months Labs in 8 weeks    Signed electronically by: Mack Guise, MD  [facility]  [facility] [address], [place] [address] Phone: [phone] FAX: [phone]      CC: [name], DO [address] [place] [address] Phone: [phone]  Fax: [phone]   Return to Endocrinology clinic as below: Future Appointments  Date Time Provider [place]  12/29/2019  3:00 PM O'Neal, Cassie Freer, MD [facility]  03/17/2020  1:40 PM [name], DO [facility]

## 2019-12-29 ENCOUNTER — Ambulatory Visit: Payer: Medicare Other | Admitting: Cardiovascular Disease

## 2019-12-29 ENCOUNTER — Encounter: Payer: Self-pay | Admitting: Cardiovascular Disease

## 2019-12-29 VITALS — BP 124/80 | HR 86 | Temp 97.8°F | Ht 60.0 in | Wt 161.0 lb

## 2019-12-29 DIAGNOSIS — E785 Hyperlipidemia, unspecified: Secondary | ICD-10-CM | POA: Diagnosis not present

## 2019-12-29 DIAGNOSIS — I10 Essential (primary) hypertension: Secondary | ICD-10-CM | POA: Diagnosis not present

## 2019-12-29 DIAGNOSIS — R600 Localized edema: Secondary | ICD-10-CM

## 2019-12-29 DIAGNOSIS — I872 Venous insufficiency (chronic) (peripheral): Secondary | ICD-10-CM | POA: Diagnosis not present

## 2019-12-29 DIAGNOSIS — R0602 Shortness of breath: Secondary | ICD-10-CM | POA: Diagnosis not present

## 2019-12-29 MED ORDER — ALBUTEROL SULFATE HFA 108 (90 BASE) MCG/ACT IN AERS
2.0000 | INHALATION_SPRAY | Freq: Four times a day (QID) | RESPIRATORY_TRACT | 2 refills | Status: DC | PRN
Start: 2019-12-29 — End: 2021-10-17

## 2019-12-29 NOTE — Patient Instructions (Signed)
Medication Instructions:  Start Albuterol inhaler use every 6 hours as needed.   *If you need a refill on your cardiac medications before your next appointment, please call your pharmacy*   Lab Work: BMET, BNP today   If you have labs (blood work) drawn today and your tests are completely normal, you will receive your results only by: Marland Kitchen MyChart Message (if you have MyChart) OR . A paper copy in the mail If you have any lab test that is abnormal or we need to change your treatment, we will call you to review the results.   Testing/Procedures: Your physician has recommended that you have a pulmonary function test. Pulmonary Function Tests are a group of tests that measure how well air moves in and out of your lungs.  Chest xray - Your physician has requested that you have a chest xray, is a fast and painless imaging test that uses certain electromagnetic waves to create pictures of the structures in and around your chest. This test can help diagnose and monitor conditions such as pneumonia and other lung issues his will be done at [facility], [place]. If you should need to call them their phone number is [phone].    Follow-Up: At [facility], you and your health needs are our priority.  As part of our continuing mission to provide you with exceptional heart care, we have created designated Provider Care Teams.  These Care Teams include your primary Cardiologist (physician) and Advanced Practice Providers (APPs -  Physician Assistants and Nurse Practitioners) who all work together to provide you with the care you need, when you need it.  We recommend signing up for the patient portal called "MyChart".  Sign up information is provided on this After Visit Summary.  MyChart is used to connect with patients for Virtual Visits (Telemedicine).  Patients are able to view lab/test results, encounter notes, upcoming appointments, etc.  Non-urgent messages can be sent  to your provider as well.   To learn more about what you can do with MyChart, go to [email or web address].    Your next appointment:   3 month(s)  The format for your next appointment:   In Person  Provider:   Eleonore Chiquito, MD

## 2019-12-30 ENCOUNTER — Other Ambulatory Visit: Payer: Self-pay

## 2019-12-30 ENCOUNTER — Telehealth: Payer: Self-pay | Admitting: *Deleted

## 2019-12-30 LAB — BASIC METABOLIC PANEL
BUN/Creatinine Ratio: 15 (ref 12–28)
BUN: 14 mg/dL (ref 8–27)
CO2: 32 mmol/L — ABNORMAL HIGH (ref 20–29)
Calcium: 9.3 mg/dL (ref 8.7–10.3)
Chloride: 94 mmol/L — ABNORMAL LOW (ref 96–106)
Creatinine, Ser: 0.91 mg/dL (ref 0.57–1.00)
GFR calc Af Amer: 71 mL/min/{1.73_m2} (ref >59)
GFR calc non Af Amer: 62 mL/min/{1.73_m2} (ref >59)
Glucose: 103 mg/dL — ABNORMAL HIGH (ref 65–99)
Potassium: 3.4 mmol/L — ABNORMAL LOW (ref 3.5–5.2)
Sodium: 139 mmol/L (ref 134–144)

## 2019-12-30 LAB — BRAIN NATRIURETIC PEPTIDE: BNP: 9 pg/mL (ref 0.0–100.0)

## 2019-12-30 NOTE — Telephone Encounter (Signed)
Spoke with patient regarding appointment for PFT ordered by Dr. Angela Nevin 01/28/20 at 2:00pm at [facility]----arrival time is 1:45 pm 1st floor admissions office at [facility]---COVID screening scheduled Monday 01/25/20 at 2:45 pm at the [facility].  Patient voiced her understanding and I will mail information to her.

## 2020-01-01 DIAGNOSIS — M25561 Pain in right knee: Secondary | ICD-10-CM | POA: Diagnosis not present

## 2020-01-01 DIAGNOSIS — M25551 Pain in right hip: Secondary | ICD-10-CM | POA: Diagnosis not present

## 2020-01-01 DIAGNOSIS — R262 Difficulty in walking, not elsewhere classified: Secondary | ICD-10-CM | POA: Diagnosis not present

## 2020-01-12 ENCOUNTER — Other Ambulatory Visit: Payer: Self-pay

## 2020-01-12 ENCOUNTER — Ambulatory Visit
Admission: RE | Admit: 2020-01-12 | Discharge: 2020-01-12 | Disposition: A | Discharge status: Home / Self Care | Payer: Medicare Other | Source: Ambulatory Visit | Attending: Cardiovascular Disease | Admitting: Cardiovascular Disease

## 2020-01-12 DIAGNOSIS — R0602 Shortness of breath: Secondary | ICD-10-CM | POA: Diagnosis not present

## 2020-01-12 DIAGNOSIS — M25561 Pain in right knee: Secondary | ICD-10-CM | POA: Diagnosis not present

## 2020-01-12 DIAGNOSIS — R262 Difficulty in walking, not elsewhere classified: Secondary | ICD-10-CM | POA: Diagnosis not present

## 2020-01-12 DIAGNOSIS — M25551 Pain in right hip: Secondary | ICD-10-CM | POA: Diagnosis not present

## 2020-01-12 DIAGNOSIS — R05 Cough: Secondary | ICD-10-CM | POA: Diagnosis not present

## 2020-01-13 ENCOUNTER — Telehealth: Payer: Self-pay | Admitting: Family Medicine

## 2020-01-13 ENCOUNTER — Other Ambulatory Visit: Payer: Self-pay

## 2020-01-13 DIAGNOSIS — R911 Solitary pulmonary nodule: Secondary | ICD-10-CM

## 2020-01-13 NOTE — Telephone Encounter (Signed)
I called patient to clarify her potassium prescription-request from [facility] We were not clear if the patient was taking 60 or 80 mEq daily.  The patient reports that she is taking 80 mEq or 4 x 20 mg daily-her dose was increased because of low potassium per patient report  I will update prescription for [facility], will also confirm with PCP-I am not certain of potassium dosage change from last office visit note  Ok to leave this note for Dr Carollee Herter for when she returns from vacation

## 2020-01-15 NOTE — Telephone Encounter (Signed)
It was meant to be temporary but she has struggled with swelling in her legs and has been unable to dec diuretic and was unable to tolerate spironolactone

## 2020-01-19 ENCOUNTER — Other Ambulatory Visit: Payer: Self-pay | Admitting: Family Medicine

## 2020-01-19 DIAGNOSIS — E785 Hyperlipidemia, unspecified: Secondary | ICD-10-CM

## 2020-01-19 DIAGNOSIS — M25561 Pain in right knee: Secondary | ICD-10-CM | POA: Diagnosis not present

## 2020-01-19 DIAGNOSIS — R262 Difficulty in walking, not elsewhere classified: Secondary | ICD-10-CM | POA: Diagnosis not present

## 2020-01-19 DIAGNOSIS — M25551 Pain in right hip: Secondary | ICD-10-CM | POA: Diagnosis not present

## 2020-01-22 ENCOUNTER — Ambulatory Visit
Admission: RE | Admit: 2020-01-22 | Discharge: 2020-01-22 | Disposition: A | Discharge status: Home / Self Care | Payer: Medicare Other | Source: Ambulatory Visit | Attending: Cardiovascular Disease | Admitting: Cardiovascular Disease

## 2020-01-22 DIAGNOSIS — J439 Emphysema, unspecified: Secondary | ICD-10-CM | POA: Diagnosis not present

## 2020-01-22 DIAGNOSIS — R911 Solitary pulmonary nodule: Secondary | ICD-10-CM

## 2020-01-25 ENCOUNTER — Other Ambulatory Visit (HOSPITAL_COMMUNITY)
Admission: RE | Admit: 2020-01-25 | Discharge: 2020-01-25 | Disposition: A | Discharge status: Home / Self Care | Payer: Medicare Other | Source: Ambulatory Visit | Attending: Cardiovascular Disease | Admitting: Cardiovascular Disease

## 2020-01-25 ENCOUNTER — Other Ambulatory Visit: Payer: Self-pay | Admitting: Family Medicine

## 2020-01-25 ENCOUNTER — Other Ambulatory Visit: Payer: Self-pay

## 2020-01-25 DIAGNOSIS — Z01812 Encounter for preprocedural laboratory examination: Secondary | ICD-10-CM | POA: Insufficient documentation

## 2020-01-25 DIAGNOSIS — Z20822 Contact with and (suspected) exposure to covid-19: Secondary | ICD-10-CM | POA: Insufficient documentation

## 2020-01-25 DIAGNOSIS — G4700 Insomnia, unspecified: Secondary | ICD-10-CM

## 2020-01-26 ENCOUNTER — Other Ambulatory Visit: Payer: Self-pay

## 2020-01-26 ENCOUNTER — Telehealth: Payer: Self-pay | Admitting: Cardiovascular Disease

## 2020-01-26 DIAGNOSIS — R0602 Shortness of breath: Secondary | ICD-10-CM

## 2020-01-26 DIAGNOSIS — R918 Other nonspecific abnormal finding of lung field: Secondary | ICD-10-CM

## 2020-01-26 LAB — SARS CORONAVIRUS 2 (TAT 6-24 HRS): SARS Coronavirus 2: NEGATIVE

## 2020-01-26 NOTE — Telephone Encounter (Signed)
Hey, can you take a look at her CT results. Thanks!

## 2020-01-26 NOTE — Telephone Encounter (Signed)
I just responded to you in result message. Can you call her? Inflammatory changes and we just need to repeat a non-contrast chest CT in 3 months.   [place] T. Audie Box, [place]  [address], [place], [place] [address] [phone]  11:50 AM

## 2020-01-26 NOTE — Telephone Encounter (Signed)
Patient calling for results of her CT

## 2020-01-26 NOTE — Addendum Note (Signed)
Addended by: Caprice Beaver T on: 01/26/2020 11:56 AM   Modules accepted: Orders

## 2020-01-26 NOTE — Telephone Encounter (Signed)
Called patient, gave results.  CT ordered for 3 months.

## 2020-01-28 ENCOUNTER — Other Ambulatory Visit: Payer: Self-pay

## 2020-01-28 ENCOUNTER — Telehealth: Payer: Self-pay | Admitting: *Deleted

## 2020-01-28 ENCOUNTER — Ambulatory Visit (HOSPITAL_COMMUNITY)
Admission: RE | Admit: 2020-01-28 | Discharge: 2020-01-28 | Disposition: A | Discharge status: Home / Self Care | Payer: Medicare Other | Source: Ambulatory Visit | Attending: Cardiovascular Disease | Admitting: Cardiovascular Disease

## 2020-01-28 DIAGNOSIS — R0602 Shortness of breath: Secondary | ICD-10-CM

## 2020-01-28 LAB — PULMONARY FUNCTION TEST
DL/VA % pred: 87 %
DL/VA: 3.72 ml/min/mmHg/L
DLCO unc % pred: 76 %
DLCO unc: 12.71 ml/min/mmHg
FEF 25-75 Post: 1.01 L/sec
FEF 25-75 Pre: 0.73 L/sec
FEF2575-%Change-Post: 38 %
FEF2575-%Pred-Post: 70 %
FEF2575-%Pred-Pre: 50 %
FEV1-%Change-Post: 11 %
FEV1-%Pred-Post: 75 %
FEV1-%Pred-Pre: 67 %
FEV1-Post: 1.32 L
FEV1-Pre: 1.18 L
FEV1FVC-%Change-Post: 2 %
FEV1FVC-%Pred-Pre: 90 %
FEV6-%Change-Post: 8 %
FEV6-%Pred-Post: 85 %
FEV6-%Pred-Pre: 78 %
FEV6-Post: 1.88 L
FEV6-Pre: 1.73 L
FEV6FVC-%Change-Post: 0 %
FEV6FVC-%Pred-Post: 105 %
FEV6FVC-%Pred-Pre: 105 %
FVC-%Change-Post: 8 %
FVC-%Pred-Post: 80 %
FVC-%Pred-Pre: 74 %
FVC-Post: 1.88 L
FVC-Pre: 1.74 L
Post FEV1/FVC ratio: 70 %
Post FEV6/FVC ratio: 100 %
Pre FEV1/FVC ratio: 68 %
Pre FEV6/FVC Ratio: 99 %

## 2020-01-28 MED ORDER — ALBUTEROL SULFATE (2.5 MG/3ML) 0.083% IN NEBU
2.5000 mg | INHALATION_SOLUTION | Freq: Once | RESPIRATORY_TRACT | Status: AC
  Administered 2020-01-28: 2.5 mg via RESPIRATORY_TRACT

## 2020-01-28 NOTE — Telephone Encounter (Addendum)
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 Needs referral to Rodman Pickle, MD at [facility].  Joan [name], [place]  [address], [place], [place] [address] [phone]  3:08 PM

## 2020-01-29 ENCOUNTER — Other Ambulatory Visit: Payer: Self-pay

## 2020-01-29 ENCOUNTER — Telehealth: Payer: Self-pay | Admitting: Cardiovascular Disease

## 2020-01-29 DIAGNOSIS — E876 Hypokalemia: Secondary | ICD-10-CM

## 2020-01-29 MED ORDER — POTASSIUM CHLORIDE CRYS ER 20 MEQ PO TBCR: 60.0000 meq | EXTENDED_RELEASE_TABLET | Freq: Every day | ORAL | 1 refills | Status: DC

## 2020-01-29 NOTE — Telephone Encounter (Signed)
Spoke with patient regarding appointment for pulmonary consult---scheduled 03/16/20 at 11:15 am with Dr. [address], [place], [place] Phone [phone].  Will also mail information to patient

## 2020-02-02 ENCOUNTER — Other Ambulatory Visit: Payer: Self-pay | Admitting: Family Medicine

## 2020-02-02 DIAGNOSIS — E876 Hypokalemia: Secondary | ICD-10-CM

## 2020-02-02 MED ORDER — POTASSIUM CHLORIDE CRYS ER 20 MEQ PO TBCR: 60.0000 meq | EXTENDED_RELEASE_TABLET | Freq: Every day | ORAL | 0 refills | Status: DC

## 2020-02-02 MED ORDER — POTASSIUM CHLORIDE CRYS ER 20 MEQ PO TBCR: 60.0000 meq | EXTENDED_RELEASE_TABLET | Freq: Every day | ORAL | 3 refills | Status: DC

## 2020-02-02 NOTE — Telephone Encounter (Signed)
90DAY SUPPLY REQUEST   10DAY SUPPLY REQUEST sent to [facility] so patient will have enough pill until they come from [facility].   Per patient, please be sure to send updated prescription to pharmacies, patient take medication 4 tablets daily. Per last visit     Medication: potassium chloride SA (KLOR-CON) 20 MEQ tablet [[id number]  Has the patient contacted their pharmacy? No. (If no, request that the patient contact the pharmacy for the refill.) (If yes, when and what did the pharmacy advise?)  Preferred Pharmacy (with phone number or street name): [facility] [address], [place], [place], [place] [address]  Phone:  [phone] Fax:  [phone]  DEA #:  --  Agent: Please be advised that RX refills may take up to 3 business days. We ask that you follow-up with your pharmacy.

## 2020-02-02 NOTE — Telephone Encounter (Signed)
Medications refilled to pharmacy for mail and short supply.

## 2020-02-03 ENCOUNTER — Telehealth: Payer: Self-pay | Admitting: Family Medicine

## 2020-02-03 DIAGNOSIS — E876 Hypokalemia: Secondary | ICD-10-CM

## 2020-02-03 NOTE — Telephone Encounter (Signed)
New message:    Joan [name] is calling and states they are needing clarification for potassium chloride SA (KLOR-CON) 20 MEQ tablet. They need to know the form and directions for this medication. Ref # [id number]. Please advise.

## 2020-02-04 MED ORDER — POTASSIUM CHLORIDE CRYS ER 20 MEQ PO TBCR: 80.0000 meq | EXTENDED_RELEASE_TABLET | Freq: Every day | ORAL | 1 refills | Status: DC

## 2020-02-04 NOTE — Telephone Encounter (Signed)
Per last lab results- Pt to be on 4 tablets daily of Klor-con 40meq. Tried calling pharmacy- after long wait call dropped. Will resend correct sig.

## 2020-02-04 NOTE — Telephone Encounter (Signed)
New Message:   Pt is calling and states she is needing the prescription instructions changed for potassium chloride SA (KLOR-CON) 20 MEQ tablet and sent to the local pharmacy because she is almost out. [place], [place] [address]

## 2020-02-18 DIAGNOSIS — M62830 Muscle spasm of back: Secondary | ICD-10-CM | POA: Diagnosis not present

## 2020-02-18 DIAGNOSIS — Z79891 Long term (current) use of opiate analgesic: Secondary | ICD-10-CM | POA: Diagnosis not present

## 2020-02-18 DIAGNOSIS — M961 Postlaminectomy syndrome, not elsewhere classified: Secondary | ICD-10-CM | POA: Diagnosis not present

## 2020-02-18 DIAGNOSIS — G894 Chronic pain syndrome: Secondary | ICD-10-CM | POA: Diagnosis not present

## 2020-02-23 ENCOUNTER — Other Ambulatory Visit (INDEPENDENT_AMBULATORY_CARE_PROVIDER_SITE_OTHER): Payer: Medicare Other

## 2020-02-23 ENCOUNTER — Other Ambulatory Visit: Payer: Self-pay

## 2020-02-23 DIAGNOSIS — E890 Postprocedural hypothyroidism: Secondary | ICD-10-CM

## 2020-02-23 LAB — TSH: TSH: 0.97 u[IU]/mL (ref 0.35–4.50)

## 2020-02-23 LAB — T4, FREE: Free T4: 1.08 ng/dL (ref 0.60–1.60)

## 2020-03-16 ENCOUNTER — Ambulatory Visit: Payer: Medicare Other | Admitting: Internal Medicine

## 2020-03-16 ENCOUNTER — Encounter: Payer: Self-pay | Admitting: Internal Medicine

## 2020-03-16 ENCOUNTER — Other Ambulatory Visit: Payer: Self-pay

## 2020-03-16 DIAGNOSIS — J449 Chronic obstructive pulmonary disease, unspecified: Secondary | ICD-10-CM | POA: Insufficient documentation

## 2020-03-16 DIAGNOSIS — F17210 Nicotine dependence, cigarettes, uncomplicated: Secondary | ICD-10-CM | POA: Diagnosis not present

## 2020-03-16 DIAGNOSIS — R918 Other nonspecific abnormal finding of lung field: Secondary | ICD-10-CM | POA: Insufficient documentation

## 2020-03-16 NOTE — Assessment & Plan Note (Signed)
Counseled re importance of smoking cessation but did not meet time criteria for separate billing           Each maintenance medication was reviewed in detail including emphasizing most importantly the difference between maintenance and prns and under what circumstances the prns are to be triggered using an action plan format where appropriate.  Total time for H and P, chart review, counseling, and generating customized AVS unique to this office visit / charting =  > 60 min

## 2020-03-16 NOTE — Patient Instructions (Addendum)
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

## 2020-03-16 NOTE — Assessment & Plan Note (Signed)
Active smoker - PFT's  01/28/20   FEV1 1.18 (75 % ) ratio 0.70 (0.62 if use fev1/VC)   p 11 % improvement from saba p nothing  prior to study with DLCO  12.71 (76%) corrects to 3.72 (87%)  for alv volume and FV curve classically concave   > 3 min discussion I reviewed the Fletcher curve with the patient that basically indicates  if you quit smoking when your best day FEV1 is still well preserved (as is   the case here)  it is highly unlikely you will progress to severe disease and informed the patient there was  no medication on the market that has proven to alter the curve/ its downward trajectory  or the likelihood of progression of their disease(unlike other chronic medical conditions such as atheroclerosis where we do think we can change the natural hx with risk reducing meds)    Therefore stopping smoking and maintaining abstinence are  the most important aspects of care, not choice of inhalers or for that matter, doctors.   Treatment other than smoking cessation  is entirely directed by severity of symptoms and focused also on reducing exacerbations, not attempting to change the natural history of the disease.   Since not really limited by sob or having aecopd,  rx is directed at stop smoking and f/u prn progression/flares.

## 2020-03-16 NOTE — Assessment & Plan Note (Signed)
See CT chest  01/22/20 MPNs largest RUL x 7 mm > f/u rec 3-6 m   CT results reviewed with pt >>> Too small for PET or bx, not suspicious enough for excisional bx > really only option for now is follow the Fleischner society guidelines as rec by radiology as above/ defer to Dr Etter Sjogren as also should be considered for Low dose screening program for the next 5 years or so.   Discussed in detail all the  indications, usual  risks and alternatives  relative to the benefits with patient who agrees to proceed with conservative f/u as outlined

## 2020-03-16 NOTE — Progress Notes (Addendum)
Joan [name], female    DOB: 1945-10-14,    MRN: [id number]   Brief patient profile:  37 yowf active smoker breathing fine 10/2019 p multiple orhto surgeries could still walk from her door to the dumpster and back and still able to walk at [place]  But much more limited by legs than breathing referred to pulmonary clinic in [place]  03/16/2020 by Dr    Etter Sjogren for GOLD II criteria by PFTs 01/28/2020     History of Present Illness  03/16/2020  Pulmonary/ 1st office eval/Joan [name] re GOLD II (barelly)  Chief Complaint  Patient presents with   Consult    shortness of breath with exertion  Dyspnea:  Denies it being a limited  problem vs legs  Cough: not really bothering a little bit of phlegm/ slt brown  Sleep: 45 degrees x 10 years  SABA use:  Not using / not sure when to do so   No obvious day to day or daytime variability or assoc  mucus plugs or hemoptysis or cp or chest tightness, subjective wheeze or overt sinus or hb symptoms.   Sleeping as above  without nocturnal  or early am exacerbation  of respiratory  c/o's or need for noct saba. Also denies any obvious fluctuation of symptoms with weather or environmental changes or other aggravating or alleviating factors except as outlined above   No unusual exposure hx or h/o childhood pna/ asthma or knowledge of premature birth.  Current Allergies, Complete Past Medical History, Past Surgical History, Family History, and Social History were reviewed in [employer] record.  ROS  The following are not active complaints unless bolded Hoarseness, sore throat, dysphagia, dental problems, itching, sneezing,  nasal congestion or discharge of excess mucus or purulent secretions, ear ache,   fever, chills, sweats, unintended wt loss or wt gain, classically pleuritic or exertional cp,  orthopnea pnd or arm/hand swelling  or leg swelling, presyncope, palpitations, abdominal pain, anorexia, nausea, vomiting, diarrhea  or change in  bowel habits or change in bladder habits, change in stools or change in urine, dysuria, hematuria,  rash, arthralgias, visual complaints, headache, numbness, weakness or ataxia or problems with walking or coordination,  change in mood or  memory.             Past Medical History:  Diagnosis Date   Arthritis    "shoulders; wrist; probably spine" (06/24/2013)   Chronic low back pain    followed by Dr Hardin Negus pain mgt   Colon polyp    Constipation    Esophageal stricture    Finger pain, left    2 fingers on left hand since wrist surgery   GERD (gastroesophageal reflux disease)    Heart murmur    "slight; not on RX" (06/24/2013)   History of cardiac arrhythmia    cardiologist- Traci Turner   Hx of colonic polyps 08/28/2004   Hyperlipemia    Hypertension    Hypothyroidism    Osteoarthritis of left knee    advanced   PONV (postoperative nausea and vomiting)    Pt reports symptoms are the result of gallbladder and cholecystectomy, not anesthesia   PVC's (premature ventricular contractions)    Sleep apnea 1990's   "tested; tried mask; couldn't stand it; told me as long as I slept on my side I'd be ok" (06/24/2013)   Thyroid nodule    Tobacco abuse     Outpatient Medications Prior to Visit  Medication Sig Dispense Refill  albuterol (VENTOLIN HFA) 108 (90 Base) MCG/ACT inhaler Inhale 2 puffs into the lungs every 6 (six) hours as needed for wheezing or shortness of breath. 6.7 g 2   bisacodyl (DULCOLAX) 5 MG EC tablet Take 5-10 mg by mouth daily as needed for mild constipation.  30 tablet    carisoprodol (SOMA) 350 MG tablet Take 175 mg by mouth 3 (three) times daily.      dorzolamide-timolol (COSOPT) 22.3-6.8 MG/ML ophthalmic solution Place 1 drop into the left eye 2 (two) times daily.     estradiol (ESTRACE) 0.5 MG tablet Take 0.5 mg by mouth at bedtime.   4   furosemide (LASIX) 40 MG tablet Take 1 tablet (40 mg total) by mouth 2 (two) times daily. 180  tablet 1   hydrochlorothiazide (HYDRODIURIL) 25 MG tablet TAKE 1 TABLET BY MOUTH  DAILY (Patient taking differently: Take 25 mg by mouth daily. ) 90 tablet 3   levothyroxine (SYNTHROID) 88 MCG tablet Take 1 tablet (88 mcg total) by mouth as directed. 1.5 tablets on Sundays, 1 tablet Monday through Saturday 64 tablet 1   oxyCODONE-acetaminophen (PERCOCET) 10-325 MG tablet Take 1 tablet by mouth every 4 (four) hours as needed for pain.      potassium chloride SA (KLOR-CON) 20 MEQ tablet Take 4 tablets (80 mEq total) by mouth daily. 120 tablet 1   simvastatin (ZOCOR) 20 MG tablet TAKE 1 TABLET BY MOUTH AT  BEDTIME 90 tablet 3   traZODone (DESYREL) 50 MG tablet TAKE 1 TABLET BY MOUTH AT BEDTIME 90 tablet 0   cephALEXin (KEFLEX) 500 MG capsule Take 1 capsule (500 mg total) by mouth 2 (two) times daily. (Patient not taking: Reported on 03/16/2020) 20 capsule 0   No facility-administered medications prior to visit.     Objective:     BP 124/76 (BP Location: Left Arm, Cuff Size: Normal)    Pulse 74    Temp (!) 97.2 F (36.2 C) (Other (Comment)) Comment (Src): wrist   Ht 5' (1.524 m)    Wt 165 lb 12.8 oz (75.2 kg)    SpO2 98% Comment: Room air   BMI 32.38 kg/m   SpO2: 98 % (Room air)   amb wf nad    HEENT : pt wearing mask not removed for exam due to covid -19 concerns.    NECK :  without JVD/Nodes/TM/ nl carotid upstrokes bilaterally   LUNGS: no acc muscle use,  Nl contour chest which is clear to A and P bilaterally without cough on insp or exp maneuvers   CV:  RRR  no s3 or murmur or increase in P2, and trace pitting edema  both LE's  ABD:  soft and nontender with nl inspiratory excursion in the supine position. No bruits or organomegaly appreciated, bowel sounds nl  MS:  Nl gait/ ext warm without deformities, calf tenderness, cyanosis or clubbing No obvious joint restrictions   SKIN: warm and dry with chronic venous stasis changes both LEs  NEURO:  alert, approp, nl sensorium  with  no motor or cerebellar deficits apparent.     I personally reviewed images and agree with radiology impression as follows:   Chest CT w/o contrast  01/22/20  Multiple bilateral pulmonary nodules are noted, the largest measuring 7 mm in the right upper lobe. There is a cluster of nodule seen laterally in the right upper lobe which most likely are inflammatory in etiology, but neoplasm cannot be excluded. Non-contrast chest CT at 3-6 months is recommended. If  the nodules are stable at time of repeat CT, then future CT at 18-24 months (from today's scan) is considered optional for low-risk patients, but is recommended for high-risk patients.      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 Active smoker - PFT's  01/28/20   FEV1 1.18 (75 % ) ratio 0.70 (0.62 if use fev1/VC)   p 11 % improvement from saba p nothing  prior to study with DLCO  12.71 (76%) corrects to 3.72 (87%)  for alv volume and FV curve classically concave   > 3 min discussion I reviewed the Fletcher curve with the patient that basically indicates  if you quit smoking when your best day FEV1 is still well preserved (as is   the case here)  it is highly unlikely you will progress to severe disease and informed the patient there was  no medication on the market that has proven to alter the curve/ its downward trajectory  or the likelihood of progression of their disease(unlike other chronic medical conditions such as atheroclerosis where we do think we can change the natural hx with risk reducing meds)    Therefore stopping smoking and maintaining abstinence are  the most important aspects of care, not choice of inhalers or for that matter, doctors.   Treatment other than smoking cessation  is entirely directed by severity of symptoms and focused also on reducing exacerbations, not attempting to change the natural history of the disease.   Since not really limited by sob or having aecopd,  rx is directed at stop  smoking and f/u prn progression/flares.    Multiple pulmonary nodules determined by computed tomography of lung See CT chest  01/22/20 MPNs largest RUL x 7 mm > f/u rec 3-6 m   CT results reviewed with pt >>> Too small for PET or bx, not suspicious enough for excisional bx > really only option for now is follow the Fleischner society guidelines as rec by radiology as above/ defer to Dr Etter Sjogren as also should be considered for Low dose screening program for the next 5 years or so.  Discussed in detail all the  indications, usual  risks and alternatives  relative to the benefits with patient who agrees to proceed with conservative f/u as outlined       Cigarette smoker Counseled re importance of smoking cessation but did not meet time criteria for separate billing         Each maintenance medication was reviewed in detail including emphasizing most importantly the difference between maintenance and prns and under what circumstances the prns are to be triggered using an action plan format where appropriate.  Total time for H and P, chart review, counseling, and generating customized AVS unique to this office visit / charting =  > 60 min        Christinia Gully, MD 03/16/2020

## 2020-03-17 ENCOUNTER — Other Ambulatory Visit: Payer: Self-pay

## 2020-03-17 ENCOUNTER — Ambulatory Visit (INDEPENDENT_AMBULATORY_CARE_PROVIDER_SITE_OTHER): Payer: Medicare Other | Admitting: Family Medicine

## 2020-03-17 ENCOUNTER — Encounter: Payer: Self-pay | Admitting: Family Medicine

## 2020-03-17 VITALS — BP 126/70 | HR 79 | Temp 98.3°F | Resp 18 | Ht 60.0 in | Wt 167.0 lb

## 2020-03-17 DIAGNOSIS — D229 Melanocytic nevi, unspecified: Secondary | ICD-10-CM

## 2020-03-17 DIAGNOSIS — R600 Localized edema: Secondary | ICD-10-CM | POA: Diagnosis not present

## 2020-03-17 DIAGNOSIS — E876 Hypokalemia: Secondary | ICD-10-CM | POA: Diagnosis not present

## 2020-03-17 DIAGNOSIS — I10 Essential (primary) hypertension: Secondary | ICD-10-CM

## 2020-03-17 DIAGNOSIS — R609 Edema, unspecified: Secondary | ICD-10-CM

## 2020-03-17 MED ORDER — NONFORMULARY OR COMPOUNDED ITEM: 1 refills | Status: AC

## 2020-03-18 LAB — COMPREHENSIVE METABOLIC PANEL
AG Ratio: 2 (calc) (ref 1.0–2.5)
ALT: 8 U/L (ref 6–29)
AST: 14 U/L (ref 10–35)
Albumin: 4.1 g/dL (ref 3.6–5.1)
Alkaline phosphatase (APISO): 95 U/L (ref 37–153)
BUN: 13 mg/dL (ref 7–25)
CO2: 32 mmol/L (ref 20–32)
Calcium: 8.4 mg/dL — ABNORMAL LOW (ref 8.6–10.4)
Chloride: 98 mmol/L (ref 98–110)
Creat: 0.76 mg/dL (ref 0.60–0.93)
Globulin: 2.1 g/dL (calc) (ref 1.9–3.7)
Glucose, Bld: 126 mg/dL — ABNORMAL HIGH (ref 65–99)
Potassium: 3 mmol/L — ABNORMAL LOW (ref 3.5–5.3)
Sodium: 140 mmol/L (ref 135–146)
Total Bilirubin: 0.4 mg/dL (ref 0.2–1.2)
Total Protein: 6.2 g/dL (ref 6.1–8.1)

## 2020-03-18 LAB — LIPID PANEL
Cholesterol: 174 mg/dL (ref <200)
HDL: 54 mg/dL (ref >=50)
LDL Cholesterol (Calc): 89 mg/dL (calc)
Non-HDL Cholesterol (Calc): 120 mg/dL (calc) (ref <130)
Total CHOL/HDL Ratio: 3.2 (calc) (ref <5.0)
Triglycerides: 211 mg/dL — ABNORMAL HIGH (ref <150)

## 2020-03-20 ENCOUNTER — Other Ambulatory Visit: Payer: Self-pay | Admitting: Family Medicine

## 2020-03-20 DIAGNOSIS — R739 Hyperglycemia, unspecified: Secondary | ICD-10-CM

## 2020-03-20 DIAGNOSIS — E785 Hyperlipidemia, unspecified: Secondary | ICD-10-CM

## 2020-03-20 NOTE — Progress Notes (Signed)
Patient ID: Joan [name], female    DOB: 1945-08-12  Age: 74 y.o. MRN: [id number]    Subjective:  Subjective  HPI LINDELL TUSSEY presents for f/u edema b/l low ext and her legs are breaking out again from the swelling  Pt states cardiology said the swelling is not from her heart   Review of Systems  Constitutional: Negative for appetite change, diaphoresis, fatigue and unexpected weight change.  Eyes: Negative for pain, redness and visual disturbance.  Respiratory: Negative for cough, chest tightness, shortness of breath and wheezing.   Cardiovascular: Negative for chest pain, palpitations and leg swelling.  Endocrine: Negative for cold intolerance, heat intolerance, polydipsia, polyphagia and polyuria.  Genitourinary: Negative for difficulty urinating, dysuria and frequency.  Neurological: Negative for dizziness, light-headedness, numbness and headaches.    History Past Medical History:  Diagnosis Date  . Arthritis    "shoulders; wrist; probably spine" (06/24/2013)  . Chronic low back pain    followed by Dr Hardin Negus pain mgt  . Colon polyp   . Constipation   . Esophageal stricture   . Finger pain, left    2 fingers on left hand since wrist surgery  . GERD (gastroesophageal reflux disease)   . Heart murmur    "slight; not on RX" (06/24/2013)  . History of cardiac arrhythmia    cardiologist- Traci Turner  . Hx of colonic polyps 08/28/2004  . Hyperlipemia   . Hypertension   . Hypothyroidism   . Osteoarthritis of left knee    advanced  . PONV (postoperative nausea and vomiting)    Pt reports symptoms are the result of gallbladder and cholecystectomy, not anesthesia  . PVC's (premature ventricular contractions)   . Sleep apnea 1990's   "tested; tried mask; couldn't stand it; told me as long as I slept on my side I'd be ok" (06/24/2013)  . Thyroid nodule   . Tobacco abuse     She has a past surgical history that includes Lumbar fusion; Cesarean section (1972); Ankle  surgery (Left, 1995); Infusion pump implantation (1990's); Knee arthroscopy (Left, 1991; ~ 1993); Elbow surgery (1990's); Thyroidectomy (2012); Cervical spine surgery (2012); Foot surgery (Right, 2012); Shoulder arthroscopy (Left, 09/2011); Lumbar laminectomy/decompression microdiscectomy (N/A, 11/28/2012); Back surgery; Cholecystectomy (N/A, 04/16/2013); Laparoscopic lysis of adhesions (N/A, 04/16/2013); Posterior fusion lumbar spine (06/24/2013); Abdominal hysterectomy (1988); ORIF distal radius fracture (Left, 12/30/2013); ORIF wrist fracture (Left, 12/30/2013); Robotic assisted salpingo oophorectomy (Bilateral, 08/20/2014); Abdominal hysterectomy; Total knee arthroplasty (Right, 09/09/2017); Total hip arthroplasty (Left, 04/27/2019); and Total hip arthroplasty (Right, 09/09/2019).   Her family history includes Bone cancer in her sister; Breast cancer in her maternal aunt and sister; Cancer in her father; Coronary artery disease (age of onset: 14) in her mother; Diabetes in her mother and sister; Diabetes type II in her mother; Hypertension in her brother, mother, and sister; Pancreatic cancer in her father; Skin cancer in her mother; Thyroid cancer in her mother.She reports that she has been smoking cigarettes. She has a 75.00 pack-year smoking history. She has never used smokeless tobacco. She reports that she does not drink alcohol and does not use drugs.  Current Outpatient Medications on File Prior to Visit  Medication Sig Dispense Refill  . albuterol (VENTOLIN HFA) 108 (90 Base) MCG/ACT inhaler Inhale 2 puffs into the lungs every 6 (six) hours as needed for wheezing or shortness of breath. 6.7 g 2  . bisacodyl (DULCOLAX) 5 MG EC tablet Take 5-10 mg by mouth daily as needed for mild constipation.  30 tablet   . carisoprodol (SOMA) 350 MG tablet Take 175 mg by mouth 3 (three) times daily.     . dorzolamide-timolol (COSOPT) 22.3-6.8 MG/ML ophthalmic solution Place 1 drop into the left eye 2 (two) times daily.      Marland Kitchen estradiol (ESTRACE) 0.5 MG tablet Take 0.5 mg by mouth at bedtime.   4  . furosemide (LASIX) 40 MG tablet Take 1 tablet (40 mg total) by mouth 2 (two) times daily. 180 tablet 1  . levothyroxine (SYNTHROID) 88 MCG tablet Take 1 tablet (88 mcg total) by mouth as directed. 1.5 tablets on Sundays, 1 tablet Monday through Saturday 64 tablet 1  . oxyCODONE-acetaminophen (PERCOCET) 10-325 MG tablet Take 1 tablet by mouth every 4 (four) hours as needed for pain.     . potassium chloride SA (KLOR-CON) 20 MEQ tablet Take 4 tablets (80 mEq total) by mouth daily. 120 tablet 1  . simvastatin (ZOCOR) 20 MG tablet TAKE 1 TABLET BY MOUTH AT  BEDTIME 90 tablet 3  . traZODone (DESYREL) 50 MG tablet TAKE 1 TABLET BY MOUTH AT BEDTIME 90 tablet 0   No current facility-administered medications on file prior to visit.     Objective:  Objective  Physical Exam Vitals and nursing note reviewed.  Constitutional:      Appearance: She is well-developed.  HENT:     Head: Normocephalic and atraumatic.  Eyes:     Conjunctiva/sclera: Conjunctivae normal.  Neck:     Thyroid: No thyromegaly.     Vascular: No carotid bruit or JVD.  Cardiovascular:     Rate and Rhythm: Normal rate and regular rhythm.     Heart sounds: Normal heart sounds. No murmur heard.   Pulmonary:     Effort: Pulmonary effort is normal. No respiratory distress.     Breath sounds: Normal breath sounds. No wheezing or rales.  Chest:     Chest wall: No tenderness.  Musculoskeletal:        General: Swelling present. No tenderness.     Cervical back: Normal range of motion and neck supple.     Right lower leg: No tenderness. 2+ Edema present.     Left lower leg: No tenderness. 2+ Edema present.  Neurological:     Mental Status: She is alert and oriented to person, place, and time.    BP 126/70 (BP Location: Left Arm, Patient Position: Sitting, Cuff Size: Large)   Pulse 79   Temp 98.3 F (36.8 C) (Oral)   Resp 18   Ht 5' (1.524 m)   Wt  167 lb (75.8 kg)   SpO2 96%   BMI 32.61 kg/m  Wt Readings from Last 3 Encounters:  03/17/20 167 lb (75.8 kg)  03/16/20 165 lb 12.8 oz (75.2 kg)  12/29/19 161 lb (73 kg)     Lab Results  Component Value Date   WBC 5.2 12/01/2019   HGB 12.8 12/01/2019   HCT 37.9 12/01/2019   PLT 206.0 12/01/2019   GLUCOSE 126 (H) 03/17/2020   CHOL 174 03/17/2020   TRIG 211 (H) 03/17/2020   HDL 54 03/17/2020   LDLDIRECT 106.0 11/29/2015   LDLCALC 89 03/17/2020   ALT 8 03/17/2020   AST 14 03/17/2020   NA 140 03/17/2020   K 3.0 (L) 03/17/2020   CL 98 03/17/2020   CREATININE 0.76 03/17/2020   BUN 13 03/17/2020   CO2 32 03/17/2020   TSH 0.97 02/23/2020   INR 1.0 09/03/2019   HGBA1C 6.0 12/30/2017  No results found.   Assessment & Plan:  Plan  I have discontinued Dao M. Dimitrov's hydrochlorothiazide. I am also having her start on NONFORMULARY OR COMPOUNDED ITEM. Additionally, I am having her maintain her carisoprodol, bisacodyl, dorzolamide-timolol, estradiol, oxyCODONE-acetaminophen, furosemide, levothyroxine, albuterol, simvastatin, traZODone, and potassium chloride SA.  Meds ordered this encounter  Medications  . NONFORMULARY OR COMPOUNDED ITEM    Sig: Compression socks  20-30 mm/ hg   Dx low ext edema    Dispense:  1 each    Refill:  1    Problem List Items Addressed This Visit      Unprioritized   Edema    Compression hose con't diuretics Check labs today Refer to vascular       Essential hypertension    Well controlled, no changes to meds. Encouraged heart healthy diet such as the DASH diet and exercise as tolerated.       Relevant Orders   Lipid panel (Completed)   Comprehensive metabolic panel (Completed)   Hypokalemia   Relevant Orders   Lipid panel (Completed)   Comprehensive metabolic panel (Completed)    Other Visit Diagnoses    Bilateral lower extremity edema    -  Primary   Relevant Medications   NONFORMULARY OR COMPOUNDED ITEM   Other Relevant  Orders   Ambulatory referral to Vascular Surgery   Lipid panel (Completed)   Comprehensive metabolic panel (Completed)   Suspicious nevus       Relevant Orders   Ambulatory referral to Dermatology      Follow-up: No follow-ups on file.  [name], DO

## 2020-03-20 NOTE — Patient Instructions (Signed)

## 2020-03-20 NOTE — Assessment & Plan Note (Signed)
Well controlled, no changes to meds. Encouraged heart healthy diet such as the DASH diet and exercise as tolerated.  °

## 2020-03-20 NOTE — Assessment & Plan Note (Signed)
Compression hose con't diuretics Check labs today Refer to vascular

## 2020-03-23 ENCOUNTER — Encounter: Payer: Self-pay | Admitting: Family Medicine

## 2020-03-23 NOTE — Telephone Encounter (Signed)
Please advise 

## 2020-03-24 DIAGNOSIS — I872 Venous insufficiency (chronic) (peripheral): Secondary | ICD-10-CM | POA: Diagnosis not present

## 2020-03-24 DIAGNOSIS — L820 Inflamed seborrheic keratosis: Secondary | ICD-10-CM | POA: Diagnosis not present

## 2020-03-31 ENCOUNTER — Other Ambulatory Visit: Payer: Self-pay

## 2020-03-31 ENCOUNTER — Ambulatory Visit: Payer: Medicare Other | Admitting: Cardiovascular Disease

## 2020-03-31 ENCOUNTER — Encounter: Payer: Self-pay | Admitting: Cardiovascular Disease

## 2020-03-31 VITALS — BP 132/84 | HR 76 | Ht 60.0 in | Wt 166.0 lb

## 2020-03-31 DIAGNOSIS — I10 Essential (primary) hypertension: Secondary | ICD-10-CM

## 2020-03-31 DIAGNOSIS — E785 Hyperlipidemia, unspecified: Secondary | ICD-10-CM

## 2020-03-31 DIAGNOSIS — R600 Localized edema: Secondary | ICD-10-CM | POA: Diagnosis not present

## 2020-03-31 DIAGNOSIS — Z720 Tobacco use: Secondary | ICD-10-CM

## 2020-03-31 DIAGNOSIS — I872 Venous insufficiency (chronic) (peripheral): Secondary | ICD-10-CM

## 2020-03-31 DIAGNOSIS — E876 Hypokalemia: Secondary | ICD-10-CM

## 2020-03-31 DIAGNOSIS — R0602 Shortness of breath: Secondary | ICD-10-CM | POA: Diagnosis not present

## 2020-03-31 MED ORDER — FUROSEMIDE 40 MG PO TABS: 40.0000 mg | ORAL_TABLET | Freq: Every day | ORAL | 3 refills | Status: DC

## 2020-03-31 MED ORDER — SPIRONOLACTONE 25 MG PO TABS
12.5000 mg | ORAL_TABLET | Freq: Every day | ORAL | 3 refills | Status: DC
Start: 2020-03-31 — End: 2020-11-23

## 2020-03-31 MED ORDER — POTASSIUM CHLORIDE CRYS ER 20 MEQ PO TBCR: 40.0000 meq | EXTENDED_RELEASE_TABLET | Freq: Every day | ORAL | 1 refills | Status: DC

## 2020-03-31 NOTE — Patient Instructions (Addendum)
Medication Instructions:  PLEASE DECREASE POTASSIUM TO 40MEQ ONCE DAILY PLEASE DECREASE LASIX TO 40MG  ONCE DAILY  PLEASE START ALDACTONE 12.5MG  (0.5 TABLET) ONCE DAILY *If you need a refill on your cardiac medications before your next appointment, please call your pharmacy*  Lab Work: BMET- THIS IS NEEDED IN 2 [place]  If you have labs (blood work) drawn today and your tests are completely normal, you will receive your results only by: Marland Kitchen MyChart Message (if you have MyChart) OR . A paper copy in the mail If you have any lab test that is abnormal or we need to change your treatment, we will call you to review the results.  Testing/Procedures: None Ordered At This Time.   Follow-Up: At [facility], you and your health needs are our priority.  As part of our continuing mission to provide you with exceptional heart care, we have created designated Provider Care Teams.  These Care Teams include your primary Cardiologist (physician) and Advanced Practice Providers (APPs -  Physician Assistants and Nurse Practitioners) who all work together to provide you with the care you need, when you need it.  Your next appointment:   6 month(s)  The format for your next appointment:   In Person  Provider:   Eleonore Chiquito, MD

## 2020-03-31 NOTE — Progress Notes (Signed)
Cardiology Office Note:   Date:  03/31/2020  NAME:  Joan [name]    MRN: [id number] DOB:  [birth year]   PCP:  [name], DO  Cardiologist:  Evalina Field, MD   Referring MD: Carollee Herter, Alferd Apa, *   Chief Complaint  Patient presents with  . Follow-up   History of Present Illness:   Joan [name] is a 74 y.o. female with a hx of HTN, tobacco abuse, COPD, HLD who presents for follow-up. She has been diagnosed with COPD. She has venous insufficiency.  She is not been prescribed any inhalers for her COPD.  Smoking cessation was recommended.  She reports her breathing is not worsened but she has not quit smoking.  She is still smoking 1.5 packs of cigarettes per day.  She is working on quitting.  The longer she is quit is for 4 days.  She reports she was seen by dermatology who recommended leg elevation twice daily.  This is improved her lower extremity edema dramatically.  She was also prescribed steroid cream for venous insufficiency changes.  She seems to be doing well with this but still has intermittent lower extremity edema.  Weights are stable.  She is having issues with hypokalemia.  Likely related to Lasix.  She has been on high-dose potassium supplementation for years.  We discussed cutting back on her Lasix as she does not notice much improvement with her second dose in the evening.  I have also recommended an Aldactone to help raise her potassium.  Blood pressure is 132/84.  She does have plans to meet with vascular surgery regarding venous insufficiency management.  They have plans for reflux study of the lower extremities.  Problem List 1. HTN 2. Tobacco abuse -50 pack year history  3. Venous insufficiency  -negative DVT study 03/2019 -BNP 13 -EF 55-60% no WMA on my review  4.Hyperlipidemia -Total cholesterol 171, HDL 62, LDL 87, triglycerides 104 5. COPD -moderate  Past Medical History: Past Medical History:  Diagnosis Date  . Arthritis     "shoulders; wrist; probably spine" (06/24/2013)  . Chronic low back pain    followed by Dr Hardin Negus pain mgt  . Colon polyp   . Constipation   . Esophageal stricture   . Finger pain, left    2 fingers on left hand since wrist surgery  . GERD (gastroesophageal reflux disease)   . Heart murmur    "slight; not on RX" (06/24/2013)  . History of cardiac arrhythmia    cardiologist- Traci Turner  . Hx of colonic polyps 08/28/2004  . Hyperlipemia   . Hypertension   . Hypothyroidism   . Osteoarthritis of left knee    advanced  . PONV (postoperative nausea and vomiting)    Pt reports symptoms are the result of gallbladder and cholecystectomy, not anesthesia  . PVC's (premature ventricular contractions)   . Sleep apnea 1990's   "tested; tried mask; couldn't stand it; told me as long as I slept on my side I'd be ok" (06/24/2013)  . Thyroid nodule   . Tobacco abuse     Past Surgical History: Past Surgical History:  Procedure Laterality Date  . ABDOMINAL HYSTERECTOMY  1988   "partial" (06/24/2013)  . ABDOMINAL HYSTERECTOMY     partial in 1988  . ANKLE SURGERY Left 1995   "tendon repair" (06/24/2013)  . BACK SURGERY     "think today was my 8th back OR" (06/24/2013)  . CERVICAL SPINE SURGERY  2012  .  [place]  . CHOLECYSTECTOMY N/A 04/16/2013   Procedure: LAPAROSCOPIC CHOLECYSTECTOMY ;  Surgeon: Imogene Burn. Georgette Dover, MD;  Location: [facility];  Service: General;  Laterality: N/A;  . ELBOW SURGERY  1990's  . FOOT SURGERY Right 2012   SPUR REMOVED  . INFUSION PUMP IMPLANTATION  1990's   "implantablet morphine pump; took it out w/in 11 months  . KNEE ARTHROSCOPY Left 1991; ~ 1993  . LAPAROSCOPIC LYSIS OF ADHESIONS N/A 04/16/2013   Procedure: LAPAROSCOPIC LYSIS OF ADHESIONS;  Surgeon: Imogene Burn. Georgette Dover, MD;  Location: [facility];  Service: General;  Laterality: N/A;  . LUMBAR FUSION     and rods  . LUMBAR LAMINECTOMY/DECOMPRESSION MICRODISCECTOMY N/A 11/28/2012   Procedure:  DECOMPRESSIVE LUMBAR LAMINECTOMY LEVEL 1;  Surgeon: Elaina Hoops, MD;  Location: [facility];  Service: Neurosurgery;  Laterality: N/A;  DECOMPRESSIVE LUMBAR LAMINECTOMY LEVEL 1  . ORIF DISTAL RADIUS FRACTURE Left 12/30/2013   dr Caralyn Guile  . ORIF WRIST FRACTURE Left 12/30/2013   Procedure: OPEN REDUCTION INTERNAL FIXATION (ORIF) LEFT WRIST FRACTURE AND REPAIR AS INDICATED;  Surgeon: Linna Hoff, MD;  Location: [facility];  Service: Orthopedics;  Laterality: Left;  . POSTERIOR FUSION LUMBAR SPINE  06/24/2013  . ROBOTIC ASSISTED SALPINGO OOPHERECTOMY Bilateral 08/20/2014   Procedure: ROBOTIC ASSISTED SALPINGO OOPHORECTOMY;  Surgeon: Daria Pastures, MD;  Location: [facility];  Service: Gynecology;  Laterality: Bilateral;  . SHOULDER ARTHROSCOPY Left 09/2011  . THYROIDECTOMY  2012  . TOTAL HIP ARTHROPLASTY Left 04/27/2019   Procedure: TOTAL HIP ARTHROPLASTY ANTERIOR APPROACH;  Surgeon: Gaynelle Arabian, MD;  Location: [facility];  Service: Orthopedics;  Laterality: Left;  171min  . TOTAL HIP ARTHROPLASTY Right 09/09/2019   Procedure: TOTAL HIP ARTHROPLASTY ANTERIOR APPROACH;  Surgeon: Gaynelle Arabian, MD;  Location: [facility];  Service: Orthopedics;  Laterality: Right;  133min  . TOTAL KNEE ARTHROPLASTY Right 09/09/2017   Procedure: RIGHT TOTAL KNEE ARTHROPLASTY;  Surgeon: Gaynelle Arabian, MD;  Location: [facility];  Service: Orthopedics;  Laterality: Right;    Current Medications: Current Meds  Medication Sig  . albuterol (VENTOLIN HFA) 108 (90 Base) MCG/ACT inhaler Inhale 2 puffs into the lungs every 6 (six) hours as needed for wheezing or shortness of breath.  Marland Kitchen augmented betamethasone dipropionate (DIPROLENE-AF) 0.05 % cream Apply topically.  . bisacodyl (DULCOLAX) 5 MG EC tablet Take 5-10 mg by mouth daily as needed for mild constipation.   . carisoprodol (SOMA) 350 MG tablet Take 175 mg by mouth 3 (three) times daily.   . dorzolamide-timolol (COSOPT) 22.3-6.8 MG/ML ophthalmic solution Place 1 drop into the left eye  2 (two) times daily.  Marland Kitchen estradiol (ESTRACE) 0.5 MG tablet Take 0.5 mg by mouth at bedtime.   . furosemide (LASIX) 40 MG tablet Take 1 tablet (40 mg total) by mouth daily.  Marland Kitchen levothyroxine (SYNTHROID) 88 MCG tablet Take 1 tablet (88 mcg total) by mouth as directed. 1.5 tablets on Sundays, 1 tablet Monday through Saturday  . NONFORMULARY OR COMPOUNDED ITEM Compression socks  20-30 mm/ hg   Dx low ext edema  . oxyCODONE-acetaminophen (PERCOCET) 10-325 MG tablet Take 1 tablet by mouth every 4 (four) hours as needed for pain.   . potassium chloride SA (KLOR-CON) 20 MEQ tablet Take 2 tablets (40 mEq total) by mouth daily.  . simvastatin (ZOCOR) 20 MG tablet TAKE 1 TABLET BY MOUTH AT  BEDTIME  . traZODone (DESYREL) 50 MG tablet TAKE 1 TABLET BY MOUTH AT BEDTIME  . [DISCONTINUED] furosemide (LASIX)  40 MG tablet Take 1 tablet (40 mg total) by mouth 2 (two) times daily.  . [DISCONTINUED] potassium chloride SA (KLOR-CON) 20 MEQ tablet Take 4 tablets (80 mEq total) by mouth daily.     Allergies:    Morphine and related, 5-alpha reductase inhibitors, Amitriptyline, Gabapentin, and Triamterene   Social History: Social History   Socioeconomic History  . Marital status: Divorced    Spouse name: Not on file  . Number of children: Not on file  . Years of education: Not on file  . Highest education level: Not on file  Occupational History  . Not on file  Tobacco Use  . Smoking status: Current Every Day Smoker    Packs/day: 1.50    Years: 50.00    Pack years: 75.00    Types: Cigarettes  . Smokeless tobacco: Never Used  Vaping Use  . Vaping Use: Never used  Substance and Sexual Activity  . Alcohol use: No    Alcohol/week: 0.0 standard drinks  . Drug use: No  . Sexual activity: Not Currently  Other Topics Concern  . Not on file  Social History Narrative   Divorced   Current Smoker  1 ppd -  20 yrs      Alcohol use-no       [employer] - laid off       Physician roster:    Dr. Elta Guadeloupe Philips - pain management   Dr. Philis Pique - GYN   Dr. Saintclair Halsted - Neurosurgery   Dr. Ronnald Ramp - dermatology   Dr. Caralyn Guile - orthopedics   Dr. Fransico Him - cardiology   Dr. Miller-ophthalmology   Social Determinants of Health   Financial Resource Strain:   . Difficulty of Paying Living Expenses: Not on file  Food Insecurity:   . Worried About Charity fundraiser in the Last Year: Not on file  . Ran Out of Food in the Last Year: Not on file  Transportation Needs:   . Lack of Transportation (Medical): Not on file  . Lack of Transportation (Non-Medical): Not on file  Physical Activity:   . Days of Exercise per Week: Not on file  . Minutes of Exercise per Session: Not on file  Stress:   . Feeling of Stress : Not on file  Social Connections:   . Frequency of Communication with Friends and Family: Not on file  . Frequency of Social Gatherings with Friends and Family: Not on file  . Attends Religious Services: Not on file  . Active Member of Clubs or Organizations: Not on file  . Attends Archivist Meetings: Not on file  . Marital Status: Not on file     Family History: The patient's family history includes Bone cancer in her sister; Breast cancer in her maternal aunt and sister; Cancer in her father; Coronary artery disease (age of onset: 51) in her mother; Diabetes in her mother and sister; Diabetes type II in her mother; Hypertension in her brother, mother, and sister; Pancreatic cancer in her father; Skin cancer in her mother; Thyroid cancer in her mother. There is no history of Other.  ROS:   All other ROS reviewed and negative. Pertinent positives noted in the HPI.     EKGs/Labs/Other Studies Reviewed:   The following studies were personally reviewed by me today:  EKG:  EKG is ordered today.  The ekg ordered today demonstrates normal sinus rhythm, heart rate 76, low voltage, and was personally reviewed by me.   TTE 12/25/2019  1. MIld inferior basal hypokinesis  Prominent epicaridal fat. Left  ventricular ejection fraction, by estimation, is 50 to 55%. The left  ventricle has low normal function. The left ventricle demonstrates  regional wall motion abnormalities (see scoring  diagram/findings for description). Left ventricular diastolic parameters  are consistent with Grade I diastolic dysfunction (impaired relaxation).  2. Right ventricular systolic function is normal. The right ventricular  size is normal.  3. The mitral valve is normal in structure. No evidence of mitral valve  regurgitation. No evidence of mitral stenosis.  4. The aortic valve was not well visualized. Aortic valve regurgitation  is not visualized. Mild aortic valve sclerosis is present, with no  evidence of aortic valve stenosis.  5. The inferior vena cava is normal in size with greater than 50%  respiratory variability, suggesting right atrial pressure of 3 mmHg.   Recent Labs: 12/01/2019: Hemoglobin 12.8; Platelets 206.0 12/29/2019: BNP 9.0 02/23/2020: TSH 0.97 03/17/2020: ALT 8; BUN 13; Creat 0.76; Potassium 3.0; Sodium 140   Recent Lipid Panel    Component Value Date/Time   CHOL 174 03/17/2020 1416   TRIG 211 (H) 03/17/2020 1416   HDL 54 03/17/2020 1416   CHOLHDL 3.2 03/17/2020 1416   VLDL 31.8 12/01/2019 1459   LDLCALC 89 03/17/2020 1416   LDLDIRECT 106.0 11/29/2015 1515    Physical Exam:   VS:  BP 132/84   Pulse 76   Ht 5' (1.524 m)   Wt 166 lb (75.3 kg)   SpO2 96%   BMI 32.42 kg/m    Wt Readings from Last 3 Encounters:  03/31/20 166 lb (75.3 kg)  03/17/20 167 lb (75.8 kg)  03/16/20 165 lb 12.8 oz (75.2 kg)    General: Well nourished, well developed, in no acute distress Heart: Atraumatic, normal size  Eyes: PEERLA, EOMI  Neck: Supple, no JVD Endocrine: No thryomegaly Cardiac: Normal S1, S2; RRR; no murmurs, rubs, or gallops Lungs: Clear to auscultation bilaterally, no wheezing, rhonchi or rales  Abd: Soft, nontender, no hepatomegaly  Ext: 1+  lower extremity edema, chronic venous insufficiency changes noted, lichenification of the skin Musculoskeletal: No deformities, BUE and BLE strength normal and equal Skin: Warm and dry, no rashes   Neuro: Alert and oriented to person, place, time, and situation, CNII-XII grossly intact, no focal deficits  Psych: Normal mood and affect   ASSESSMENT:   Joan [name] is a 74 y.o. female who presents for the following: 1. Shortness of breath   2. Venous insufficiency   3. Leg edema   4. Essential hypertension   5. Hyperlipidemia, unspecified hyperlipidemia type   6. Tobacco abuse   7. Hypokalemia   8. Lower extremity edema     PLAN:   1. Shortness of breath -Echo with normal EF.  No wall motion abnormality my review.  She has grade 1 diastolic function without elevated left atrial pressure.  No evidence of volume overload.  Normal BNP.  No evidence of heart failure on my exam. -Her shortness of breath is likely COPD related.  No symptoms of angina.  2. Venous insufficiency 3. Leg edema -Lower extreme edema is venous insufficiency related.  Improving with leg elevation.  We will reduce her Lasix to 40 mg daily.  Reduce potassium to 40 mEq daily.  Add Aldactone 12.5 mg daily to help with potassium supplement.  She has had hypokalemia for years.  This will help. -She has plans to meet with vascular surgery.  She has plans for lower extremity  reflux study.  4. Essential hypertension -BP well controlled.  Continue current medications.  5. Hyperlipidemia, unspecified hyperlipidemia type -Acceptable level for her.  6. Tobacco abuse -Smoking cessation advised.  7. Hypokalemia -We will add Aldactone 12.5 mg daily to help raise her potassium.  Disposition: Return in about 6 months (around 09/28/2020).  Medication Adjustments/Labs and Tests Ordered: Current medicines are reviewed at length with the patient today.  Concerns regarding medicines are outlined above.  Orders Placed This  Encounter  Procedures  . Basic metabolic panel  . EKG 12-Lead   Meds ordered this encounter  Medications  . potassium chloride SA (KLOR-CON) 20 MEQ tablet    Sig: Take 2 tablets (40 mEq total) by mouth daily.    Dispense:  120 tablet    Refill:  1  . furosemide (LASIX) 40 MG tablet    Sig: Take 1 tablet (40 mg total) by mouth daily.    Dispense:  30 tablet    Refill:  3  . spironolactone (ALDACTONE) 25 MG tablet    Sig: Take 0.5 tablets (12.5 mg total) by mouth daily.    Dispense:  30 tablet    Refill:  3    Patient Instructions  Medication Instructions:  PLEASE DECREASE POTASSIUM 40MEQ ONCE DAILY PLEASE DECREASE LASIX 40MG  ONCE DAILY  PLEASE START ALDACTONE 12.5MG  (0.5 TABLET) ONCE DAILY *If you need a refill on your cardiac medications before your next appointment, please call your pharmacy*  Lab Work: BMET- THIS IS NEEDED IN 2 [facility]  If you have labs (blood work) drawn today and your tests are completely normal, you will receive your results only by: Marland Kitchen MyChart Message (if you have MyChart) OR . A paper copy in the mail If you have any lab test that is abnormal or we need to change your treatment, we will call you to review the results.  Testing/Procedures: None Ordered At This Time.   Follow-Up: At [facility], you and your health needs are our priority.  As part of our continuing mission to provide you with exceptional heart care, we have created designated Provider Care Teams.  These Care Teams include your primary Cardiologist (physician) and Advanced Practice Providers (APPs -  Physician Assistants and Nurse Practitioners) who all work together to provide you with the care you need, when you need it.  Your next appointment:   6 month(s)  The format for your next appointment:   In Person  Provider:   Eleonore Chiquito, MD    Time Spent with Patient: I have spent a total of 35 minutes with patient reviewing hospital notes, telemetry, EKGs,  labs and examining the patient as well as establishing an assessment and plan that was discussed with the patient.  > 50% of time was spent in direct patient care.  Signed, Addison Naegeli. Audie Box, Patrick  [address], [place], [place] [address] [phone]  03/31/2020 2:39 PM

## 2020-04-07 ENCOUNTER — Other Ambulatory Visit: Payer: Self-pay

## 2020-04-07 DIAGNOSIS — M7989 Other specified soft tissue disorders: Secondary | ICD-10-CM

## 2020-04-11 ENCOUNTER — Telehealth: Payer: Self-pay | Admitting: Cardiovascular Disease

## 2020-04-11 NOTE — Telephone Encounter (Signed)
Spoke with patient regarding appointment for CT chest w/o contrast scheduled Monday 04/18/20 at 6:00 pm at [facility] time is 5:45pm for registration.  Patient voiced her understanding.

## 2020-04-14 DIAGNOSIS — G894 Chronic pain syndrome: Secondary | ICD-10-CM | POA: Diagnosis not present

## 2020-04-14 DIAGNOSIS — M961 Postlaminectomy syndrome, not elsewhere classified: Secondary | ICD-10-CM | POA: Diagnosis not present

## 2020-04-14 DIAGNOSIS — Z79891 Long term (current) use of opiate analgesic: Secondary | ICD-10-CM | POA: Diagnosis not present

## 2020-04-14 DIAGNOSIS — M62830 Muscle spasm of back: Secondary | ICD-10-CM | POA: Diagnosis not present

## 2020-04-18 ENCOUNTER — Other Ambulatory Visit: Payer: Self-pay

## 2020-04-18 ENCOUNTER — Ambulatory Visit (HOSPITAL_COMMUNITY)
Admission: RE | Admit: 2020-04-18 | Discharge: 2020-04-18 | Disposition: A | Discharge status: Home / Self Care | Payer: Medicare Other | Source: Ambulatory Visit | Attending: Cardiovascular Disease | Admitting: Cardiovascular Disease

## 2020-04-18 DIAGNOSIS — R0602 Shortness of breath: Secondary | ICD-10-CM | POA: Diagnosis not present

## 2020-04-18 DIAGNOSIS — N6310 Unspecified lump in the right breast, unspecified quadrant: Secondary | ICD-10-CM | POA: Diagnosis not present

## 2020-04-18 DIAGNOSIS — R918 Other nonspecific abnormal finding of lung field: Secondary | ICD-10-CM | POA: Insufficient documentation

## 2020-04-18 DIAGNOSIS — I700 Atherosclerosis of aorta: Secondary | ICD-10-CM | POA: Diagnosis not present

## 2020-04-18 DIAGNOSIS — J432 Centrilobular emphysema: Secondary | ICD-10-CM | POA: Diagnosis not present

## 2020-04-20 ENCOUNTER — Ambulatory Visit: Payer: Medicare Other | Admitting: Internal Medicine

## 2020-04-20 ENCOUNTER — Other Ambulatory Visit: Payer: Self-pay

## 2020-04-20 ENCOUNTER — Encounter: Payer: Self-pay | Admitting: Internal Medicine

## 2020-04-20 VITALS — BP 126/74 | HR 74 | Ht 60.0 in | Wt 169.2 lb

## 2020-04-20 DIAGNOSIS — Z23 Encounter for immunization: Secondary | ICD-10-CM

## 2020-04-20 DIAGNOSIS — R0602 Shortness of breath: Secondary | ICD-10-CM | POA: Diagnosis not present

## 2020-04-20 DIAGNOSIS — E890 Postprocedural hypothyroidism: Secondary | ICD-10-CM

## 2020-04-20 DIAGNOSIS — I10 Essential (primary) hypertension: Secondary | ICD-10-CM | POA: Diagnosis not present

## 2020-04-20 DIAGNOSIS — I872 Venous insufficiency (chronic) (peripheral): Secondary | ICD-10-CM | POA: Diagnosis not present

## 2020-04-20 DIAGNOSIS — R600 Localized edema: Secondary | ICD-10-CM | POA: Diagnosis not present

## 2020-04-20 DIAGNOSIS — E785 Hyperlipidemia, unspecified: Secondary | ICD-10-CM | POA: Diagnosis not present

## 2020-04-20 NOTE — Patient Instructions (Signed)
-   Continue Levothyroxine 88 mcg , 1.5 tablets on Sundays and 1 tablet rest of the week

## 2020-04-20 NOTE — Progress Notes (Signed)
Name: Joan [name]  MRN/ DOB: [id number], 1945-11-18    Age/ Sex: 74 y.o., female     PCP: Carollee Herter, Alferd Apa, DO   Reason for Endocrinology Evaluation: Hypothyroidism     Initial Endocrinology Clinic Visit: 08/27/2019    PATIENT IDENTIFIER: [name] is a 74 y.o., female with a past medical history of HTN and hypothyroidism. She has followed with [facility] since 08/27/2019 for consultative assistance with management of her Hypothyroidism   HISTORICAL SUMMARY:    She has been diagnosed with left thyroid nodule in 2010 . She is S/P FNA on the left nodule in 2012 with atypia. She is s/p total thyroidectomy with benign pathology and evidence of hashimoto's thyroiditis.   Has been on LT-4 replacement since 1982 , her dose has been gradually reduced SUBJECTIVE:    Today (04/20/2020):  Joan [name] is here for a follow up on post-operative hypothyroidism.   She is on Levothyroxine 88 mcg , 1.5 tabs on Sundays and 1 tab rest of the week    No MVI or Biotin   Has noted slight worsening of LE edema  Stable constipation  Unable to sleep well at night     HISTORY:  Past Medical History:  Past Medical History:  Diagnosis Date   Arthritis    "shoulders; wrist; probably spine" (06/24/2013)   Chronic low back pain    followed by Dr Hardin Negus pain mgt   Colon polyp    Constipation    Esophageal stricture    Finger pain, left    2 fingers on left hand since wrist surgery   GERD (gastroesophageal reflux disease)    Heart murmur    "slight; not on RX" (06/24/2013)   History of cardiac arrhythmia    cardiologist- Traci Turner   Hx of colonic polyps 08/28/2004   Hyperlipemia    Hypertension    Hypothyroidism    Osteoarthritis of left knee    advanced   PONV (postoperative nausea and vomiting)    Pt reports symptoms are the result of gallbladder and cholecystectomy, not anesthesia   PVC's (premature ventricular contractions)      Sleep apnea 1990's   "tested; tried mask; couldn't stand it; told me as long as I slept on my side I'd be ok" (06/24/2013)   Thyroid nodule    Tobacco abuse    Past Surgical History:  Past Surgical History:  Procedure Laterality Date   ABDOMINAL HYSTERECTOMY  1988   "partial" (06/24/2013)   ABDOMINAL HYSTERECTOMY     partial in [place]   "tendon repair" (06/24/2013)   BACK SURGERY     "think today was my 8th back OR" (06/24/2013)   CERVICAL SPINE SURGERY  2012   CESAREAN SECTION  1972   CHOLECYSTECTOMY N/A 04/16/2013   Procedure: LAPAROSCOPIC CHOLECYSTECTOMY ;  Surgeon: Imogene Burn. Tsuei, MD;  Location: [facility];  Service: General;  Laterality: N/A;   ELBOW SURGERY  1990's   FOOT SURGERY Right 2012   SPUR REMOVED   INFUSION PUMP IMPLANTATION  1990's   "implantablet morphine pump; took it out w/in 11 months   KNEE ARTHROSCOPY Left 1991; ~ [place] N/A 04/16/2013   Procedure: LAPAROSCOPIC LYSIS OF ADHESIONS;  Surgeon: Imogene Burn. Georgette Dover, MD;  [facility]: [facility];  Service: General;  Laterality: N/A;   LUMBAR FUSION     and rods   LUMBAR LAMINECTOMY/DECOMPRESSION MICRODISCECTOMY N/A 11/28/2012  Procedure: DECOMPRESSIVE LUMBAR LAMINECTOMY LEVEL 1;  Surgeon: Elaina Hoops, MD;  Location: [facility];  Service: Neurosurgery;  Laterality: N/A;  DECOMPRESSIVE LUMBAR LAMINECTOMY LEVEL 1   ORIF DISTAL RADIUS FRACTURE Left 12/30/2013   dr Caralyn Guile   ORIF WRIST FRACTURE Left 12/30/2013   Procedure: OPEN REDUCTION INTERNAL FIXATION (ORIF) LEFT WRIST FRACTURE AND REPAIR AS INDICATED;  Surgeon: Linna Hoff, MD;  Location: [facility];  Service: Orthopedics;  Laterality: Left;   POSTERIOR FUSION LUMBAR SPINE  06/24/2013   ROBOTIC ASSISTED SALPINGO OOPHERECTOMY Bilateral 08/20/2014   Procedure: ROBOTIC ASSISTED SALPINGO OOPHORECTOMY;  Surgeon: Daria Pastures, MD;  Location: [facility];  Service: Gynecology;  Laterality: Bilateral;    SHOULDER ARTHROSCOPY Left 09/2011   THYROIDECTOMY  2012   TOTAL HIP ARTHROPLASTY Left 04/27/2019   Procedure: TOTAL HIP ARTHROPLASTY ANTERIOR APPROACH;  Surgeon: Gaynelle Arabian, MD;  [facility];  Service: Orthopedics;  Laterality: Left;  129min   TOTAL HIP ARTHROPLASTY Right 09/09/2019   Procedure: TOTAL HIP ARTHROPLASTY ANTERIOR APPROACH;  Surgeon: Gaynelle Arabian, MD;  [facility];  Service: Orthopedics;  Laterality: Right;  142min   TOTAL KNEE ARTHROPLASTY Right 09/09/2017   Procedure: RIGHT TOTAL KNEE ARTHROPLASTY;  Surgeon: Gaynelle Arabian, MD;  [facility];  Service: Orthopedics;  Laterality: Right;    Social History:  reports that she has been smoking cigarettes. She has a 75.00 pack-year smoking history. She has never used smokeless tobacco. She reports that she does not drink alcohol and does not use drugs. Family History:  Family History  Problem Relation Age of Onset   Pancreatic cancer Father    Cancer Father    Diabetes type II Mother    Hypertension Mother    Coronary artery disease Mother 8   Diabetes Mother    Thyroid cancer Mother    Skin cancer Mother    Diabetes Sister    Hypertension Sister    Bone cancer Sister    Breast cancer Sister    Hypertension Brother    Breast cancer Maternal Aunt    Other Neg Hx        No family history of  colon cancer     HOME MEDICATIONS: Allergies as of 04/20/2020      Reactions   Morphine And Related Swelling   5-alpha Reductase Inhibitors    Amitriptyline Swelling   Leg swelling   Gabapentin Itching   Triamterene Itching, Rash      Medication List       Accurate as of April 20, 2020 10:57 AM. If you have any questions, ask your nurse or doctor.        albuterol 108 (90 Base) MCG/ACT inhaler Commonly known as: VENTOLIN HFA Inhale 2 puffs into the lungs every 6 (six) hours as needed for wheezing or shortness of breath.   augmented betamethasone dipropionate 0.05 % cream Commonly  known as: DIPROLENE-AF Apply topically.   bisacodyl 5 MG EC tablet Commonly known as: DULCOLAX Take 5-10 mg by mouth daily as needed for mild constipation.   carisoprodol 350 MG tablet Commonly known as: SOMA Take 175 mg by mouth 3 (three) times daily.   dorzolamide-timolol 22.3-6.8 MG/ML ophthalmic solution Commonly known as: COSOPT Place 1 drop into the left eye 2 (two) times daily.   estradiol 0.5 MG tablet Commonly known as: ESTRACE Take 0.5 mg by mouth at bedtime.   furosemide 40 MG tablet Commonly known as: LASIX Take 1 tablet (40 mg total) by mouth daily.  levothyroxine 88 MCG tablet Commonly known as: SYNTHROID Take 1 tablet (88 mcg total) by mouth as directed. 1.5 tablets on Sundays, 1 tablet Monday through Saturday   NONFORMULARY OR COMPOUNDED ITEM Compression socks  20-30 mm/ hg   Dx low ext edema   oxyCODONE-acetaminophen 10-325 MG tablet Commonly known as: PERCOCET Take 1 tablet by mouth every 4 (four) hours as needed for pain.   potassium chloride SA 20 MEQ tablet Commonly known as: KLOR-CON Take 2 tablets (40 mEq total) by mouth daily.   simvastatin 20 MG tablet Commonly known as: ZOCOR TAKE 1 TABLET BY MOUTH AT  BEDTIME   spironolactone 25 MG tablet Commonly known as: ALDACTONE Take 0.5 tablets (12.5 mg total) by mouth daily.   traZODone 50 MG tablet Commonly known as: DESYREL TAKE 1 TABLET BY MOUTH AT BEDTIME         OBJECTIVE:   PHYSICAL EXAM: VS: There were no vitals taken for this visit.   EXAM: General: Pt appears well and is in NAD  Neck: General: Supple without adenopathy. Thyroid: Surgically removed. No goiter or nodules appreciated.   Lungs: Clear with good BS bilat with no rales, rhonchi, or wheezes  Heart: Auscultation: RRR.  Extremities:  BL LE: Trace pretibial edema normal ROM and strength.  Mental Status: Judgment, insight: Intact Orientation: Oriented to time, place, and person Mood and affect: No depression,  anxiety, or agitation     DATA REVIEWED: Results for Joan [name], Joan [name] (MRN [id number]) as of 12/28/2019 16:21  Ref. Range 12/28/2019 14:02  TSH Latest Ref Range: 0.35 - 4.50 uIU/mL 0.18 (L)  T4,Free(Direct) Latest Ref Range: 0.60 - 1.60 ng/dL 1.87 (H)   Pathology report 10/31/2010    Diagnosis 1. Thyroid, thyroidectomy - ADENOMATOID NODULE WITH HURTHLE CELL CHANGES. - HASHIMOTO'S THYROIDITIS - SEE COMMENT 2. Lymph node, biopsy, left parathyroidal - BENIGN THYROIDAL TISSUE WITH HASHIMOTO'S THYROIDITIS. - NO LYMPH NODAL TISSUE IDENTIFIED Microscopic  ASSESSMENT / PLAN / RECOMMENDATIONS:   1. Postsurgical Hypothyroidism:   -Patient is clinically euthyroid -No local neck symptoms -Patient is going to have labs today at the cardiology office, and would like to group her labs together hence will have TSH drawn at cardiology office   Medications  Levothyroxine 88 mcg, 1.5 tabs on Sunday and 1 tab rest of the week     F/U in 4 months Labs in 8 weeks    Signed electronically by: Mack Guise, MD  [facility]  [address] [place], [place] [address] Phone: [phone] FAX: [phone]      CC: [name], DO [address] [place] [address] Phone: [phone]  Fax: [phone]   Return to Endocrinology clinic as below: Future Appointments  Date Time Provider [place]  04/20/2020  1:00 PM Tricia Pledger, Melanie Crazier, MD [facility] None  04/27/2020  1:00 PM [facility]CV HS VASC 1 - [facility] VVS  04/27/2020  1:45 PM [facility] PA [facility]VS  06/16/2020  1:40 PM [name], DO [facility]

## 2020-04-21 LAB — BASIC METABOLIC PANEL
BUN/Creatinine Ratio: 15 (ref 12–28)
BUN: 15 mg/dL (ref 8–27)
CO2: 28 mmol/L (ref 20–29)
Calcium: 9 mg/dL (ref 8.7–10.3)
Chloride: 98 mmol/L (ref 96–106)
Creatinine, Ser: 0.97 mg/dL (ref 0.57–1.00)
GFR calc Af Amer: 66 mL/min/{1.73_m2} (ref >59)
GFR calc non Af Amer: 57 mL/min/{1.73_m2} — ABNORMAL LOW (ref >59)
Glucose: 106 mg/dL — ABNORMAL HIGH (ref 65–99)
Potassium: 3.8 mmol/L (ref 3.5–5.2)
Sodium: 141 mmol/L (ref 134–144)

## 2020-04-25 ENCOUNTER — Other Ambulatory Visit: Payer: Self-pay | Admitting: Family Medicine

## 2020-04-25 DIAGNOSIS — B078 Other viral warts: Secondary | ICD-10-CM | POA: Diagnosis not present

## 2020-04-25 DIAGNOSIS — I872 Venous insufficiency (chronic) (peripheral): Secondary | ICD-10-CM | POA: Diagnosis not present

## 2020-04-25 DIAGNOSIS — L820 Inflamed seborrheic keratosis: Secondary | ICD-10-CM | POA: Diagnosis not present

## 2020-04-25 DIAGNOSIS — G4700 Insomnia, unspecified: Secondary | ICD-10-CM

## 2020-04-27 ENCOUNTER — Ambulatory Visit: Payer: Medicare Other | Admitting: Physician Assistant

## 2020-04-27 ENCOUNTER — Ambulatory Visit (HOSPITAL_COMMUNITY)
Admission: RE | Admit: 2020-04-27 | Discharge: 2020-04-27 | Disposition: A | Discharge status: Home / Self Care | Payer: Medicare Other | Source: Ambulatory Visit | Attending: Physician Assistant | Admitting: Physician Assistant

## 2020-04-27 ENCOUNTER — Other Ambulatory Visit: Payer: Self-pay

## 2020-04-27 ENCOUNTER — Encounter: Payer: Self-pay | Admitting: Family Medicine

## 2020-04-27 VITALS — BP 128/72 | HR 56 | Temp 98.0°F | Resp 20 | Ht 60.0 in | Wt 168.4 lb

## 2020-04-27 DIAGNOSIS — M7989 Other specified soft tissue disorders: Secondary | ICD-10-CM

## 2020-04-27 DIAGNOSIS — I872 Venous insufficiency (chronic) (peripheral): Secondary | ICD-10-CM | POA: Diagnosis not present

## 2020-04-27 NOTE — Progress Notes (Signed)
Requested by:  Piru, DO [address] [place],  [place] [address]  Reason for consultation: bilateral lower extremity edema   History of Present Illness   Joan [name] is a 74 y.o. (1945-08-14) female who presents for evaluation of bilateral lower extremity swelling for 3-4 years. She says this initially was intermittent but now more recently over past 6 months the swelling does not subside. The swelling is slightly worse in Right leg over left. Swelling is worsened by prolonged sitting or ambulation. She had her left hip replaced 1.5 years ago and her right 6 months ago and prior to that her right knee so she does have some difficulty ambulating. She uses a cane mostly to get around. She also has extensive history of back surgeries. She was instructed by Dermatologist to elevate her legs for 1 hour twice daily and it was greatly helping her swelling but caused increased pain in her back to point that she had to stop doing so. She has tried compression before and has not really tolerated them well because she feels they cut into her knees. She has ordered a wedge to help with elevation that way she does not have to lay directly on her back. She otherwise denies any history of DVT. No family history of DVT or venous disease  Venous symptoms include: itching, swelling, skin rash Onset/duration:  3-4 years Occupation:  retired Aggravating factors: sitting, standing, prolonged ambulation Alleviating factors: elevation Compression:  no Helps:  no Pain medications:  none Previous vein procedures:  No History of DVT:  No  Past Medical History:  Diagnosis Date  . Arthritis    "shoulders; wrist; probably spine" (06/24/2013)  . Chronic low back pain    followed by Dr Hardin Negus pain mgt  . Colon polyp   . Constipation   . Esophageal stricture   . Finger pain, left    2 fingers on left hand since wrist surgery  . GERD (gastroesophageal reflux disease)   .  Heart murmur    "slight; not on RX" (06/24/2013)  . History of cardiac arrhythmia    cardiologist- Traci Turner  . Hx of colonic polyps 08/28/2004  . Hyperlipemia   . Hypertension   . Hypothyroidism   . Osteoarthritis of left knee    advanced  . PONV (postoperative nausea and vomiting)    Pt reports symptoms are the result of gallbladder and cholecystectomy, not anesthesia  . PVC's (premature ventricular contractions)   . Sleep apnea 1990's   "tested; tried mask; couldn't stand it; told me as long as I slept on my side I'd be ok" (06/24/2013)  . Thyroid nodule   . Tobacco abuse     Past Surgical History:  Procedure Laterality Date  . ABDOMINAL HYSTERECTOMY  1988   "partial" (06/24/2013)  . ABDOMINAL HYSTERECTOMY     partial in 1988  . ANKLE SURGERY Left 1995   "tendon repair" (06/24/2013)  . BACK SURGERY     "think today was my 8th back OR" (06/24/2013)  . CERVICAL SPINE SURGERY  2012  . [place]  . CHOLECYSTECTOMY N/A 04/16/2013   Procedure: LAPAROSCOPIC CHOLECYSTECTOMY ;  Surgeon: Imogene Burn. Georgette Dover, MD;  [facility];  Service: General;  Laterality: N/A;  . ELBOW SURGERY  1990's  . FOOT SURGERY Right 2012   SPUR REMOVED  . INFUSION PUMP IMPLANTATION  1990's   "implantablet morphine pump; took it out w/in 11 months  .  KNEE ARTHROSCOPY Left 1991; ~ 1993  . LAPAROSCOPIC LYSIS OF ADHESIONS N/A 04/16/2013   Procedure: LAPAROSCOPIC LYSIS OF ADHESIONS;  Surgeon: Imogene Burn. Georgette Dover, MD;  [facility];  Service: General;  Laterality: N/A;  . LUMBAR FUSION     and rods  . LUMBAR LAMINECTOMY/DECOMPRESSION MICRODISCECTOMY N/A 11/28/2012   Procedure: DECOMPRESSIVE LUMBAR LAMINECTOMY LEVEL 1;  Surgeon: Elaina Hoops, MD;  Location: [facility];  Service: Neurosurgery;  Laterality: N/A;  DECOMPRESSIVE LUMBAR LAMINECTOMY LEVEL 1  . ORIF DISTAL RADIUS FRACTURE Left 12/30/2013   dr Caralyn Guile  . ORIF WRIST FRACTURE Left 12/30/2013   Procedure: OPEN REDUCTION INTERNAL FIXATION  (ORIF) LEFT WRIST FRACTURE AND REPAIR AS INDICATED;  Surgeon: Linna Hoff, MD;  Location: [facility];  Service: Orthopedics;  Laterality: Left;  . POSTERIOR FUSION LUMBAR SPINE  06/24/2013  . ROBOTIC ASSISTED SALPINGO OOPHERECTOMY Bilateral 08/20/2014   Procedure: ROBOTIC ASSISTED SALPINGO OOPHORECTOMY;  Surgeon: Daria Pastures, MD;  Location: [facility];  Service: Gynecology;  Laterality: Bilateral;  . SHOULDER ARTHROSCOPY Left 09/2011  . THYROIDECTOMY  2012  . TOTAL HIP ARTHROPLASTY Left 04/27/2019   Procedure: TOTAL HIP ARTHROPLASTY ANTERIOR APPROACH;  Surgeon: Gaynelle Arabian, MD;  [facility];  Service: Orthopedics;  Laterality: Left;  146min  . TOTAL HIP ARTHROPLASTY Right 09/09/2019   Procedure: TOTAL HIP ARTHROPLASTY ANTERIOR APPROACH;  Surgeon: Gaynelle Arabian, MD;  [facility];  Service: Orthopedics;  Laterality: Right;  141min  . TOTAL KNEE ARTHROPLASTY Right 09/09/2017   Procedure: RIGHT TOTAL KNEE ARTHROPLASTY;  Surgeon: Gaynelle Arabian, MD;  [facility];  Service: Orthopedics;  Laterality: Right;    Social History   Socioeconomic History  . Marital status: Divorced    Spouse name: Not on file  . Number of children: Not on file  . Years of education: Not on file  . Highest education level: Not on file  Occupational History  . Not on file  Tobacco Use  . Smoking status: Current Every Day Smoker    Packs/day: 1.50    Years: 50.00    Pack years: 75.00    Types: Cigarettes  . Smokeless tobacco: Never Used  Vaping Use  . Vaping Use: Never used  Substance and Sexual Activity  . Alcohol use: No    Alcohol/week: 0.0 standard drinks  . Drug use: No  . Sexual activity: Not Currently  Other Topics Concern  . Not on file  Social History Narrative   Divorced   Current Smoker  1 ppd -  20 yrs      Alcohol use-no       [employer] - laid off       Physician roster:   Dr. Elta Guadeloupe Philips - pain management   Dr. Philis Pique - GYN   Dr. Saintclair Halsted - Neurosurgery    Dr. Ronnald Ramp - dermatology   Dr. Caralyn Guile - orthopedics   Dr. Fransico Him - cardiology   Dr. Miller-ophthalmology   Social Determinants of Health   Financial Resource Strain:   . Difficulty of Paying Living Expenses: Not on file  Food Insecurity:   . Worried About Charity fundraiser in the Last Year: Not on file  . Ran Out of Food in the Last Year: Not on file  Transportation Needs:   . Lack of Transportation (Medical): Not on file  . Lack of Transportation (Non-Medical): Not on file  Physical Activity:   . Days of Exercise per Week: Not on file  . Minutes of  Exercise per Session: Not on file  Stress:   . Feeling of Stress : Not on file  Social Connections:   . Frequency of Communication with Friends and Family: Not on file  . Frequency of Social Gatherings with Friends and Family: Not on file  . Attends Religious Services: Not on file  . Active Member of Clubs or Organizations: Not on file  . Attends Archivist Meetings: Not on file  . Marital Status: Not on file  Intimate Partner Violence:   . Fear of Current or Ex-Partner: Not on file  . Emotionally Abused: Not on file  . Physically Abused: Not on file  . Sexually Abused: Not on file    Family History  Problem Relation Age of Onset  . Pancreatic cancer Father   . Cancer Father   . Diabetes type II Mother   . Hypertension Mother   . Coronary artery disease Mother 80  . Diabetes Mother   . Thyroid cancer Mother   . Skin cancer Mother   . Diabetes Sister   . Hypertension Sister   . Bone cancer Sister   . Breast cancer Sister   . Hypertension Brother   . Breast cancer Maternal Aunt   . Other Neg Hx        No family history of  colon cancer    Current Outpatient Medications  Medication Sig Dispense Refill  . albuterol (VENTOLIN HFA) 108 (90 Base) MCG/ACT inhaler Inhale 2 puffs into the lungs every 6 (six) hours as needed for wheezing or shortness of breath. 6.7 g 2  . augmented betamethasone dipropionate  (DIPROLENE-AF) 0.05 % cream Apply topically.    . bisacodyl (DULCOLAX) 5 MG EC tablet Take 5-10 mg by mouth daily as needed for mild constipation.  30 tablet   . carisoprodol (SOMA) 350 MG tablet Take 175 mg by mouth 3 (three) times daily.     . dorzolamide-timolol (COSOPT) 22.3-6.8 MG/ML ophthalmic solution Place 1 drop into the left eye 2 (two) times daily.    Marland Kitchen estradiol (ESTRACE) 0.5 MG tablet Take 0.5 mg by mouth at bedtime.   4  . furosemide (LASIX) 40 MG tablet Take 1 tablet (40 mg total) by mouth daily. 30 tablet 3  . levothyroxine (SYNTHROID) 88 MCG tablet Take 1 tablet (88 mcg total) by mouth as directed. 1.5 tablets on Sundays, 1 tablet Monday through Saturday 64 tablet 1  . NONFORMULARY OR COMPOUNDED ITEM Compression socks  20-30 mm/ hg   Dx low ext edema 1 each 1  . oxyCODONE-acetaminophen (PERCOCET) 10-325 MG tablet Take 1 tablet by mouth every 4 (four) hours as needed for pain.     . potassium chloride SA (KLOR-CON) 20 MEQ tablet Take 2 tablets (40 mEq total) by mouth daily. 120 tablet 1  . simvastatin (ZOCOR) 20 MG tablet TAKE 1 TABLET BY MOUTH AT  BEDTIME 90 tablet 3  . spironolactone (ALDACTONE) 25 MG tablet Take 0.5 tablets (12.5 mg total) by mouth daily. 30 tablet 3  . traZODone (DESYREL) 50 MG tablet Take 1 tablet (50 mg total) by mouth at bedtime. 90 tablet 0   No current facility-administered medications for this visit.    Allergies  Allergen Reactions  . Morphine And Related Swelling  . 5-Alpha Reductase Inhibitors   . Amitriptyline Swelling    Leg swelling  . Gabapentin Itching  . Triamterene Itching and Rash    REVIEW OF SYSTEMS (negative unless checked):   Cardiac:  []  Chest pain or  chest pressure? []  Shortness of breath upon activity? []  Shortness of breath when lying flat? []  Irregular heart rhythm?  Vascular:  []  Pain in calf, thigh, or hip brought on by walking? []  Pain in feet at night that wakes you up from your sleep? []  Blood clot in your  veins? []  Leg swelling?  Pulmonary:  []  Oxygen at home? []  Productive cough? []  Wheezing?  Neurologic:  []  Sudden weakness in arms or legs? []  Sudden numbness in arms or legs? []  Sudden onset of difficult speaking or slurred speech? []  Temporary loss of vision in one eye? []  Problems with dizziness?  Gastrointestinal:  []  Blood in stool? []  Vomited blood?  Genitourinary:  []  Burning when urinating? []  Blood in urine?  Psychiatric:  []  Major depression  Hematologic:  []  Bleeding problems? []  Problems with blood clotting?  Dermatologic:  []  Rashes or ulcers?  Constitutional:  []  Fever or chills?  Ear/Nose/Throat:  []  Change in hearing? []  Nose bleeds? []  Sore throat?  Musculoskeletal:  []  Back pain? []  Joint pain? []  Muscle pain?   Physical Examination     Vitals:   04/27/20 1342  BP: 128/72  Pulse: (!) 56  Resp: 20  Temp: 98 F (36.7 C)  TempSrc: Temporal  SpO2: 95%  Weight: 168 lb 6.4 oz (76.4 kg)  Height: 5' (1.524 m)   Body mass index is 32.89 kg/m.  General:  WDWN in NAD; vital signs documented above Gait: Normal HENT: WNL, normocephalic Pulmonary: normal non-labored breathing , without wheezing Cardiac: regular HR, without  Murmurs without carotid bruit Abdomen: soft, NT, no masses Skin: with rashes bilateral lower extremities Vascular Exam/Pulses:  Right Left  Radial 2+ (normal) 2+ (normal)  Femoral 2+ (normal) 2+ (normal)  Popliteal 2+ (normal) 2+ (normal)  DP 2+ (normal) 2+ (normal)  PT 2+ (normal) 2+ (normal)   Extremities: without varicose veins, with reticular veins, with edema, with venous stasis dematitis and pigmentation, without lipodermatosclerosis, without ulcers   Musculoskeletal: no muscle wasting or atrophy  Neurologic: A&O X 3;  No focal weakness or paresthesias are detected Psychiatric:  The pt has Normal affect.  Non-invasive Vascular Imaging   BLE Venous Insufficiency Duplex (04/27/20):   RLE:   No DVT  and SVT   GSV reflux only at the knee  GSV diameter 0.223-0.626  No SSV reflux   CFV, FV deep venous reflux   LLE:  No DVT and SVT,   GSV reflux only in proximal calf   GSV diameter 0.37-0.66  SSV reflux mid calf to popliteal fossa  CFV  deep venous reflux   Medical Decision Making   TANNIA CONTINO is a 74 y.o. female who presents with: BLE chronic venous insufficiency. Duplex today shows no DVT. She does have bilateral deep reflux but minimal superficial reflux. Her veins in both legs are very small so she would not be a candidate for venous ablation. She can continue to follow up with Dermatology for her venous dermatitis but I think she will benefit for more regular use of compression stockings   Based on the patient's history and examination, I recommend conservative therapy with elevation, exercise and compression  I discussed with the patient the use of her 20-30 mm thigh high compression stockings  She will follow up as needed or if new or worsening symptoms develop  Karoline Caldwell, PA-C [facility]: [phone]  04/27/2020, 3:57 PM  Clinic MD: Dr. Oneida Alar

## 2020-04-28 ENCOUNTER — Other Ambulatory Visit: Payer: Self-pay | Admitting: Family Medicine

## 2020-04-28 DIAGNOSIS — N63 Unspecified lump in unspecified breast: Secondary | ICD-10-CM

## 2020-04-28 NOTE — Telephone Encounter (Signed)
Ct done---  Nodules seen  Diagnostic mammogram ordered

## 2020-04-29 ENCOUNTER — Ambulatory Visit: Payer: Medicare Other | Admitting: Internal Medicine

## 2020-05-03 ENCOUNTER — Other Ambulatory Visit: Payer: Self-pay

## 2020-05-03 ENCOUNTER — Other Ambulatory Visit (INDEPENDENT_AMBULATORY_CARE_PROVIDER_SITE_OTHER): Payer: Medicare Other

## 2020-05-03 DIAGNOSIS — E890 Postprocedural hypothyroidism: Secondary | ICD-10-CM

## 2020-05-03 LAB — T4, FREE: Free T4: 1.35 ng/dL (ref 0.60–1.60)

## 2020-05-03 LAB — TSH: TSH: 1.07 u[IU]/mL (ref 0.35–4.50)

## 2020-05-20 ENCOUNTER — Ambulatory Visit
Admission: RE | Admit: 2020-05-20 | Discharge: 2020-05-20 | Disposition: A | Discharge status: Home / Self Care | Payer: Medicare Other | Source: Ambulatory Visit | Attending: Family Medicine | Admitting: Family Medicine

## 2020-05-20 ENCOUNTER — Other Ambulatory Visit: Payer: Self-pay

## 2020-05-20 ENCOUNTER — Ambulatory Visit: Admission: RE | Admit: 2020-05-20 | Payer: Medicare Other | Source: Ambulatory Visit

## 2020-05-20 DIAGNOSIS — N63 Unspecified lump in unspecified breast: Secondary | ICD-10-CM

## 2020-05-20 DIAGNOSIS — R921 Mammographic calcification found on diagnostic imaging of breast: Secondary | ICD-10-CM | POA: Diagnosis not present

## 2020-06-06 DIAGNOSIS — I872 Venous insufficiency (chronic) (peripheral): Secondary | ICD-10-CM | POA: Diagnosis not present

## 2020-06-09 DIAGNOSIS — M961 Postlaminectomy syndrome, not elsewhere classified: Secondary | ICD-10-CM | POA: Diagnosis not present

## 2020-06-09 DIAGNOSIS — G894 Chronic pain syndrome: Secondary | ICD-10-CM | POA: Diagnosis not present

## 2020-06-09 DIAGNOSIS — Z79891 Long term (current) use of opiate analgesic: Secondary | ICD-10-CM | POA: Diagnosis not present

## 2020-06-09 DIAGNOSIS — M62830 Muscle spasm of back: Secondary | ICD-10-CM | POA: Diagnosis not present

## 2020-06-14 ENCOUNTER — Other Ambulatory Visit: Payer: Self-pay | Admitting: Internal Medicine

## 2020-06-16 ENCOUNTER — Ambulatory Visit (INDEPENDENT_AMBULATORY_CARE_PROVIDER_SITE_OTHER): Payer: Medicare Other | Admitting: Family Medicine

## 2020-06-16 ENCOUNTER — Encounter: Payer: Self-pay | Admitting: Family Medicine

## 2020-06-16 ENCOUNTER — Other Ambulatory Visit: Payer: Self-pay

## 2020-06-16 VITALS — BP 120/70 | HR 64 | Temp 97.9°F | Resp 18 | Ht 60.0 in | Wt 177.0 lb

## 2020-06-16 DIAGNOSIS — R609 Edema, unspecified: Secondary | ICD-10-CM | POA: Diagnosis not present

## 2020-06-16 DIAGNOSIS — K635 Polyp of colon: Secondary | ICD-10-CM | POA: Diagnosis not present

## 2020-06-16 DIAGNOSIS — E039 Hypothyroidism, unspecified: Secondary | ICD-10-CM | POA: Diagnosis not present

## 2020-06-16 DIAGNOSIS — E785 Hyperlipidemia, unspecified: Secondary | ICD-10-CM

## 2020-06-16 DIAGNOSIS — I10 Essential (primary) hypertension: Secondary | ICD-10-CM

## 2020-06-16 DIAGNOSIS — R739 Hyperglycemia, unspecified: Secondary | ICD-10-CM | POA: Diagnosis not present

## 2020-06-16 NOTE — Patient Instructions (Signed)

## 2020-06-16 NOTE — Progress Notes (Unsigned)
Patient ID: Joan [name], female    DOB: 01-03-1946  Age: 74 y.o. MRN: [id number]    Subjective:  Subjective  HPI Joan [name] presents for f/u edema---- her legs are better with compression  She will also f/u thyroid, chol and bp.   No other complaints  Review of Systems  Constitutional: Negative for appetite change, diaphoresis, fatigue and unexpected weight change.  Eyes: Negative for pain, redness and visual disturbance.  Respiratory: Negative for cough, chest tightness, shortness of breath and wheezing.   Cardiovascular: Negative for chest pain, palpitations and leg swelling.  Endocrine: Negative for cold intolerance, heat intolerance, polydipsia, polyphagia and polyuria.  Genitourinary: Negative for difficulty urinating, dysuria and frequency.  Neurological: Negative for dizziness, light-headedness, numbness and headaches.    History Past Medical History:  Diagnosis Date  . Arthritis    "shoulders; wrist; probably spine" (06/24/2013)  . Chronic low back pain    followed by Dr Hardin Negus pain mgt  . Colon polyp   . Constipation   . Esophageal stricture   . Finger pain, left    2 fingers on left hand since wrist surgery  . GERD (gastroesophageal reflux disease)   . Heart murmur    "slight; not on RX" (06/24/2013)  . History of cardiac arrhythmia    cardiologist- Traci Turner  . Hx of colonic polyps 08/28/2004  . Hyperlipemia   . Hypertension   . Hypothyroidism   . Osteoarthritis of left knee    advanced  . PONV (postoperative nausea and vomiting)    Pt reports symptoms are the result of gallbladder and cholecystectomy, not anesthesia  . PVC's (premature ventricular contractions)   . Sleep apnea 1990's   "tested; tried mask; couldn't stand it; told me as long as I slept on my side I'd be ok" (06/24/2013)  . Thyroid nodule   . Tobacco abuse     She has a past surgical history that includes Lumbar fusion; Cesarean section (1972); Ankle surgery (Left, 1995);  Infusion pump implantation (1990's); Knee arthroscopy (Left, 1991; ~ 1993); Elbow surgery (1990's); Thyroidectomy (2012); Cervical spine surgery (2012); Foot surgery (Right, 2012); Shoulder arthroscopy (Left, 09/2011); Lumbar laminectomy/decompression microdiscectomy (N/A, 11/28/2012); Back surgery; Cholecystectomy (N/A, 04/16/2013); Laparoscopic lysis of adhesions (N/A, 04/16/2013); Posterior fusion lumbar spine (06/24/2013); Abdominal hysterectomy (1988); ORIF distal radius fracture (Left, 12/30/2013); ORIF wrist fracture (Left, 12/30/2013); Robotic assisted salpingo oophorectomy (Bilateral, 08/20/2014); Abdominal hysterectomy; Total knee arthroplasty (Right, 09/09/2017); Total hip arthroplasty (Left, 04/27/2019); and Total hip arthroplasty (Right, 09/09/2019).   Her family history includes Bone cancer in her sister; Breast cancer in her maternal aunt and sister; Cancer in her father; Coronary artery disease (age of onset: 76) in her mother; Diabetes in her mother and sister; Diabetes type II in her mother; Hypertension in her brother, mother, and sister; Pancreatic cancer in her father; Skin cancer in her mother; Thyroid cancer in her mother.She reports that she has been smoking cigarettes. She has a 75.00 pack-year smoking history. She has never used smokeless tobacco. She reports that she does not drink alcohol and does not use drugs.  Current Outpatient Medications on File Prior to Visit  Medication Sig Dispense Refill  . albuterol (VENTOLIN HFA) 108 (90 Base) MCG/ACT inhaler Inhale 2 puffs into the lungs every 6 (six) hours as needed for wheezing or shortness of breath. 6.7 g 2  . augmented betamethasone dipropionate (DIPROLENE-AF) 0.05 % cream Apply topically.    . bisacodyl (DULCOLAX) 5 MG EC tablet Take 5-10 mg by mouth  daily as needed for mild constipation.  30 tablet   . carisoprodol (SOMA) 350 MG tablet Take 175 mg by mouth 3 (three) times daily.     . dorzolamide-timolol (COSOPT) 22.3-6.8 MG/ML  ophthalmic solution Place 1 drop into the left eye 2 (two) times daily.    Marland Kitchen estradiol (ESTRACE) 0.5 MG tablet Take 0.5 mg by mouth at bedtime.   4  . furosemide (LASIX) 40 MG tablet Take 1 tablet (40 mg total) by mouth daily. 30 tablet 3  . levothyroxine (SYNTHROID) 88 MCG tablet TAKE BY MOUTH AS DIRECTED. TAKE ONE TABLET BY MOUTH MONDAY THROUGH SATURDAY, TAKE 1.5 TABLETS ON SUNDAY 64 tablet 1  . NONFORMULARY OR COMPOUNDED ITEM Compression socks  20-30 mm/ hg   Dx low ext edema 1 each 1  . oxyCODONE-acetaminophen (PERCOCET) 10-325 MG tablet Take 1 tablet by mouth every 4 (four) hours as needed for pain.     . potassium chloride SA (KLOR-CON) 20 MEQ tablet Take 2 tablets (40 mEq total) by mouth daily. 120 tablet 1  . simvastatin (ZOCOR) 20 MG tablet TAKE 1 TABLET BY MOUTH AT  BEDTIME 90 tablet 3  . spironolactone (ALDACTONE) 25 MG tablet Take 0.5 tablets (12.5 mg total) by mouth daily. 30 tablet 3  . traZODone (DESYREL) 50 MG tablet Take 1 tablet (50 mg total) by mouth at bedtime. 90 tablet 0  . clobetasol cream (TEMOVATE) 3.81 % Apply 1 application topically 2 (two) times daily. 30 g 0   No current facility-administered medications on file prior to visit.     Objective:  Objective  Physical Exam Vitals and nursing note reviewed.  Constitutional:      Appearance: She is well-developed and well-nourished.  HENT:     Head: Normocephalic and atraumatic.  Eyes:     Extraocular Movements: EOM normal.     Conjunctiva/sclera: Conjunctivae normal.  Neck:     Thyroid: No thyromegaly.     Vascular: No carotid bruit or JVD.  Cardiovascular:     Rate and Rhythm: Normal rate and regular rhythm.     Heart sounds: Normal heart sounds. No murmur heard.   Pulmonary:     Effort: Pulmonary effort is normal. No respiratory distress.     Breath sounds: Normal breath sounds. No wheezing or rales.  Chest:     Chest wall: No tenderness.  Musculoskeletal:        General: Swelling present. No  tenderness.     Cervical back: Normal range of motion and neck supple.     Right lower leg: Edema present.     Left lower leg: Edema present.  Neurological:     Mental Status: She is alert and oriented to person, place, and time.  Psychiatric:        Mood and Affect: Mood and affect normal.    BP 120/70 (BP Location: Left Arm, Patient Position: Sitting, Cuff Size: Large)   Pulse 64   Temp 97.9 F (36.6 C) (Oral)   Resp 18   Ht 5' (1.524 m)   Wt 177 lb (80.3 kg)   SpO2 97%   BMI 34.57 kg/m  Wt Readings from Last 3 Encounters:  06/16/20 177 lb (80.3 kg)  04/27/20 168 lb 6.4 oz (76.4 kg)  04/20/20 169 lb 3.2 oz (76.7 kg)     Lab Results  Component Value Date   WBC 5.2 12/01/2019   HGB 12.8 12/01/2019   HCT 37.9 12/01/2019   PLT 206.0 12/01/2019   GLUCOSE 118 (  H) 06/16/2020   CHOL 155 06/16/2020   TRIG 94.0 06/16/2020   HDL 60.40 06/16/2020   LDLDIRECT 106.0 11/29/2015   LDLCALC 75 06/16/2020   ALT 13 06/16/2020   AST 18 06/16/2020   NA 138 06/16/2020   K 3.5 06/16/2020   CL 96 06/16/2020   CREATININE 1.19 06/16/2020   BUN 18 06/16/2020   CO2 35 (H) 06/16/2020   TSH 2.98 06/16/2020   INR 1.0 09/03/2019   HGBA1C 6.3 06/16/2020    MM DIAG BREAST TOMO UNI RIGHT  Result Date: 05/20/2020 CLINICAL DATA:  Patient presents for evaluation of the RIGHT breast after recent CT of the chest demonstrated 2 masses in the inferior aspect of the RIGHT breast. EXAM: DIGITAL DIAGNOSTIC UNILATERAL RIGHT MAMMOGRAM WITH TOMO AND CAD COMPARISON:  04/18/2020 CT, 10/23/2019 and earlier mammogram ACR Breast Density Category c: The breast tissue is heterogeneously dense, which may obscure small masses. FINDINGS: In the LOWER OUTER QUADRANT of the RIGHT breast there is a circumscribed oval mass measuring 2.0 centimeters showing long-term mammographic stability. In the LOWER INNER QUADRANT of the RIGHT breast, there is a circumscribed oval mass measuring 0.9 centimeters and demonstrating  long-term mammographic stability. No new or suspicious findings. Mammographic images were processed with CAD. IMPRESSION: No mammographic evidence for malignancy. Long-term stability of 2 circumscribed masses in the LOWER portion of the RIGHT breast correlating well with the CT findings. RECOMMENDATION: Screening mammogram is recommended in April 2022. I have discussed the findings and recommendations with the patient. If applicable, a reminder letter will be sent to the patient regarding the next appointment. BI-RADS CATEGORY  2: Benign. Electronically Signed   By: Nolon Nations M.D.   On: 05/20/2020 14:13     Assessment & Plan:  Plan  I am having Joan [name]. [place] maintain her carisoprodol, bisacodyl, dorzolamide-timolol, estradiol, oxyCODONE-acetaminophen, albuterol, simvastatin, NONFORMULARY OR COMPOUNDED ITEM, augmented betamethasone dipropionate, potassium chloride SA, furosemide, spironolactone, traZODone, levothyroxine, and clobetasol cream.  No orders of the defined types were placed in this encounter.   Problem List Items Addressed This Visit      Unprioritized   Edema    Improved with compression , elevation and diuretics con't        Essential hypertension    Well controlled, no changes to meds. Encouraged heart healthy diet such as the DASH diet and exercise as tolerated.       Hyperlipidemia    Tolerating statin, encouraged heart healthy diet, avoid trans fats, minimize simple carbs and saturated fats. Increase exercise as tolerated      Relevant Orders   Lipid panel (Completed)   Comprehensive metabolic panel (Completed)   Hypothyroidism    Check labs con't synthroid       Relevant Orders   TSH (Completed)    Other Visit Diagnoses    Polyp of colon, unspecified part of colon, unspecified type    -  Primary   Relevant Orders   Ambulatory referral to Gastroenterology   Hyperglycemia       Relevant Orders   Comprehensive metabolic panel (Completed)    Hemoglobin A1c (Completed)      Follow-up: Return in about 3 months (around 09/14/2020), or if symptoms worsen or fail to improve, for annual exam, fasting.  [name], DO

## 2020-06-17 LAB — COMPREHENSIVE METABOLIC PANEL
ALT: 13 U/L (ref 0–35)
AST: 18 U/L (ref 0–37)
Albumin: 4.3 g/dL (ref 3.5–5.2)
Alkaline Phosphatase: 48 U/L (ref 39–117)
BUN: 18 mg/dL (ref 6–23)
CO2: 35 mEq/L — ABNORMAL HIGH (ref 19–32)
Calcium: 8.9 mg/dL (ref 8.4–10.5)
Chloride: 96 mEq/L (ref 96–112)
Creatinine, Ser: 1.19 mg/dL (ref 0.40–1.20)
GFR: 44.7 mL/min — ABNORMAL LOW (ref >60.00)
Glucose, Bld: 118 mg/dL — ABNORMAL HIGH (ref 70–99)
Potassium: 3.5 mEq/L (ref 3.5–5.1)
Sodium: 138 mEq/L (ref 135–145)
Total Bilirubin: 0.5 mg/dL (ref 0.2–1.2)
Total Protein: 6.7 g/dL (ref 6.0–8.3)

## 2020-06-17 LAB — LIPID PANEL
Cholesterol: 155 mg/dL (ref 0–200)
HDL: 60.4 mg/dL (ref >39.00)
LDL Cholesterol: 75 mg/dL (ref 0–99)
NonHDL: 94.18
Total CHOL/HDL Ratio: 3
Triglycerides: 94 mg/dL (ref 0.0–149.0)
VLDL: 18.8 mg/dL (ref 0.0–40.0)

## 2020-06-17 LAB — TSH: TSH: 2.98 u[IU]/mL (ref 0.35–4.50)

## 2020-06-17 LAB — HEMOGLOBIN A1C: Hgb A1c MFr Bld: 6.3 % (ref 4.6–6.5)

## 2020-06-17 NOTE — Assessment & Plan Note (Signed)
Tolerating statin, encouraged heart healthy diet, avoid trans fats, minimize simple carbs and saturated fats. Increase exercise as tolerated 

## 2020-06-17 NOTE — Assessment & Plan Note (Signed)
Well controlled, no changes to meds. Encouraged heart healthy diet such as the DASH diet and exercise as tolerated.  °

## 2020-06-17 NOTE — Assessment & Plan Note (Signed)
Check labs con't synthroid 

## 2020-06-17 NOTE — Assessment & Plan Note (Signed)
Improved with compression , elevation and diuretics con't

## 2020-07-05 ENCOUNTER — Other Ambulatory Visit: Payer: Self-pay | Admitting: Family Medicine

## 2020-07-06 ENCOUNTER — Telehealth: Payer: Self-pay | Admitting: Cardiovascular Disease

## 2020-07-06 ENCOUNTER — Other Ambulatory Visit: Payer: Self-pay | Admitting: Cardiovascular Disease

## 2020-07-06 DIAGNOSIS — E876 Hypokalemia: Secondary | ICD-10-CM

## 2020-07-06 MED ORDER — POTASSIUM CHLORIDE CRYS ER 20 MEQ PO TBCR: 40.0000 meq | EXTENDED_RELEASE_TABLET | Freq: Every day | ORAL | 1 refills | Status: DC

## 2020-07-06 NOTE — Telephone Encounter (Signed)
*  STAT* If patient is at the pharmacy, call can be transferred to refill team.   1. Which medications need to be refilled? (please list name of each medication and dose if known) potassium chloride SA (KLOR-CON) 20 MEQ tablet  2. Which pharmacy/location (including street and city if local pharmacy) is medication to be sent to? [id number] - [place], [place] - [address]  3. Do they need a 30 day or 90 day supply? 90 day   Patient is out of medication

## 2020-07-11 NOTE — Telephone Encounter (Signed)
Sent 12/29.

## 2020-07-27 ENCOUNTER — Other Ambulatory Visit: Payer: Self-pay | Admitting: Family Medicine

## 2020-07-27 DIAGNOSIS — G4700 Insomnia, unspecified: Secondary | ICD-10-CM

## 2020-08-09 DIAGNOSIS — Z85828 Personal history of other malignant neoplasm of skin: Secondary | ICD-10-CM | POA: Diagnosis not present

## 2020-08-09 DIAGNOSIS — D485 Neoplasm of uncertain behavior of skin: Secondary | ICD-10-CM | POA: Diagnosis not present

## 2020-08-09 DIAGNOSIS — L308 Other specified dermatitis: Secondary | ICD-10-CM | POA: Diagnosis not present

## 2020-08-09 DIAGNOSIS — C44722 Squamous cell carcinoma of skin of right lower limb, including hip: Secondary | ICD-10-CM | POA: Diagnosis not present

## 2020-08-10 ENCOUNTER — Other Ambulatory Visit: Payer: Self-pay | Admitting: Family Medicine

## 2020-08-10 DIAGNOSIS — E785 Hyperlipidemia, unspecified: Secondary | ICD-10-CM

## 2020-08-12 DIAGNOSIS — H26493 Other secondary cataract, bilateral: Secondary | ICD-10-CM | POA: Diagnosis not present

## 2020-08-12 DIAGNOSIS — H524 Presbyopia: Secondary | ICD-10-CM | POA: Diagnosis not present

## 2020-08-17 DIAGNOSIS — G894 Chronic pain syndrome: Secondary | ICD-10-CM | POA: Diagnosis not present

## 2020-08-17 DIAGNOSIS — M62830 Muscle spasm of back: Secondary | ICD-10-CM | POA: Diagnosis not present

## 2020-08-17 DIAGNOSIS — Z79891 Long term (current) use of opiate analgesic: Secondary | ICD-10-CM | POA: Diagnosis not present

## 2020-08-17 DIAGNOSIS — M961 Postlaminectomy syndrome, not elsewhere classified: Secondary | ICD-10-CM | POA: Diagnosis not present

## 2020-08-24 DIAGNOSIS — Z85828 Personal history of other malignant neoplasm of skin: Secondary | ICD-10-CM | POA: Diagnosis not present

## 2020-08-24 DIAGNOSIS — L308 Other specified dermatitis: Secondary | ICD-10-CM | POA: Diagnosis not present

## 2020-09-15 ENCOUNTER — Encounter: Payer: Medicare Other | Admitting: Family Medicine

## 2020-09-15 ENCOUNTER — Encounter: Payer: Self-pay | Admitting: Internal Medicine

## 2020-09-19 DIAGNOSIS — D0471 Carcinoma in situ of skin of right lower limb, including hip: Secondary | ICD-10-CM | POA: Diagnosis not present

## 2020-09-19 DIAGNOSIS — L308 Other specified dermatitis: Secondary | ICD-10-CM | POA: Diagnosis not present

## 2020-09-19 DIAGNOSIS — L821 Other seborrheic keratosis: Secondary | ICD-10-CM | POA: Diagnosis not present

## 2020-09-19 DIAGNOSIS — Z85828 Personal history of other malignant neoplasm of skin: Secondary | ICD-10-CM | POA: Diagnosis not present

## 2020-09-19 DIAGNOSIS — D485 Neoplasm of uncertain behavior of skin: Secondary | ICD-10-CM | POA: Diagnosis not present

## 2020-09-20 ENCOUNTER — Ambulatory Visit: Payer: Medicare Other | Admitting: Cardiovascular Disease

## 2020-09-27 DIAGNOSIS — H26493 Other secondary cataract, bilateral: Secondary | ICD-10-CM | POA: Diagnosis not present

## 2020-09-27 DIAGNOSIS — H35341 Macular cyst, hole, or pseudohole, right eye: Secondary | ICD-10-CM | POA: Diagnosis not present

## 2020-10-05 DIAGNOSIS — Z01818 Encounter for other preprocedural examination: Secondary | ICD-10-CM | POA: Diagnosis not present

## 2020-10-05 DIAGNOSIS — H35341 Macular cyst, hole, or pseudohole, right eye: Secondary | ICD-10-CM | POA: Diagnosis not present

## 2020-10-12 DIAGNOSIS — M62830 Muscle spasm of back: Secondary | ICD-10-CM | POA: Diagnosis not present

## 2020-10-12 DIAGNOSIS — Z79891 Long term (current) use of opiate analgesic: Secondary | ICD-10-CM | POA: Diagnosis not present

## 2020-10-12 DIAGNOSIS — G894 Chronic pain syndrome: Secondary | ICD-10-CM | POA: Diagnosis not present

## 2020-10-12 DIAGNOSIS — M961 Postlaminectomy syndrome, not elsewhere classified: Secondary | ICD-10-CM | POA: Diagnosis not present

## 2020-10-16 ENCOUNTER — Other Ambulatory Visit: Payer: Self-pay | Admitting: Internal Medicine

## 2020-10-18 DIAGNOSIS — H35341 Macular cyst, hole, or pseudohole, right eye: Secondary | ICD-10-CM | POA: Diagnosis not present

## 2020-10-18 HISTORY — PX: VITRECTOMY: SHX106

## 2020-11-09 ENCOUNTER — Other Ambulatory Visit: Payer: Self-pay

## 2020-11-09 DIAGNOSIS — L308 Other specified dermatitis: Secondary | ICD-10-CM | POA: Diagnosis not present

## 2020-11-09 DIAGNOSIS — C44722 Squamous cell carcinoma of skin of right lower limb, including hip: Secondary | ICD-10-CM | POA: Diagnosis not present

## 2020-11-09 DIAGNOSIS — D485 Neoplasm of uncertain behavior of skin: Secondary | ICD-10-CM | POA: Diagnosis not present

## 2020-11-09 DIAGNOSIS — Z85828 Personal history of other malignant neoplasm of skin: Secondary | ICD-10-CM | POA: Diagnosis not present

## 2020-11-09 DIAGNOSIS — C44622 Squamous cell carcinoma of skin of right upper limb, including shoulder: Secondary | ICD-10-CM | POA: Diagnosis not present

## 2020-11-09 DIAGNOSIS — L821 Other seborrheic keratosis: Secondary | ICD-10-CM | POA: Diagnosis not present

## 2020-11-09 NOTE — Progress Notes (Signed)
Subjective:   Joan [name] is a 75 y.o. female who presents for Medicare Annual (Subsequent) preventive examination.  Review of Systems     Cardiac Risk Factors include: advanced age (>62men, >64 women);obesity (BMI >30kg/m2);sedentary lifestyle;dyslipidemia;hypertension;smoking/ tobacco exposure     Objective:    Today's Vitals   11/10/20 1458  BP: 110/70  Pulse: 67  Resp: 18  Temp: 98.1 F (36.7 C)  TempSrc: Temporal  SpO2: 98%  Weight: 170 lb (77.1 kg)   Body mass index is 33.2 kg/m.  Advanced Directives 11/10/2020 10/12/2019 09/09/2019 09/09/2019 09/02/2019 04/27/2019 04/27/2019  Does Patient Have a Medical Advance Directive? No No No No No No No  Would patient like information on creating a medical advance directive? No - Patient declined No - Patient declined No - Patient declined No - Patient declined No - Patient declined No - Patient declined No - Patient declined  Pre-existing out of facility DNR order (yellow form or pink MOST form) - - - - - - -    Current Medications (verified) Outpatient Encounter Medications as of 11/10/2020  Medication Sig  . albuterol (VENTOLIN HFA) 108 (90 Base) MCG/ACT inhaler Inhale 2 puffs into the lungs every 6 (six) hours as needed for wheezing or shortness of breath.  . bisacodyl (DULCOLAX) 5 MG EC tablet Take 5-10 mg by mouth daily as needed for mild constipation.   . carisoprodol (SOMA) 350 MG tablet Take 175 mg by mouth 3 (three) times daily.   . clobetasol cream (TEMOVATE) 6.96 % Apply 1 application topically 2 (two) times daily.  . dorzolamide-timolol (COSOPT) 22.3-6.8 MG/ML ophthalmic solution Place 1 drop into the left eye 2 (two) times daily.  Marland Kitchen estradiol (ESTRACE) 0.5 MG tablet Take 0.5 mg by mouth at bedtime.   . furosemide (LASIX) 40 MG tablet Take 1 tablet (40 mg total) by mouth daily.  . hydrochlorothiazide (HYDRODIURIL) 25 MG tablet TAKE 1 TABLET BY MOUTH  DAILY  . levothyroxine (SYNTHROID) 88 MCG tablet TAKE BY MOUTH AS  DIRECTED. TAKE ONE TABLET BY MOUTH MONDAY THROUGH SATURDAY, TAKE 1.5 TABLETS ON SUNDAY  . NONFORMULARY OR COMPOUNDED ITEM Compression socks  20-30 mm/ hg   Dx low ext edema  . oxyCODONE-acetaminophen (PERCOCET) 10-325 MG tablet Take 1 tablet by mouth every 4 (four) hours as needed for pain.   . potassium chloride SA (KLOR-CON) 20 MEQ tablet Take 2 tablets (40 mEq total) by mouth daily.  . simvastatin (ZOCOR) 20 MG tablet TAKE 1 TABLET BY MOUTH AT  BEDTIME  . traZODone (DESYREL) 50 MG tablet Take 1 tablet (50 mg total) by mouth at bedtime.  Marland Kitchen spironolactone (ALDACTONE) 25 MG tablet Take 0.5 tablets (12.5 mg total) by mouth daily.  . [DISCONTINUED] augmented betamethasone dipropionate (DIPROLENE-AF) 0.05 % cream Apply topically. (Patient not taking: Reported on 11/10/2020)   No facility-administered encounter medications on file as of 11/10/2020.    Allergies (verified) Morphine and related, 5-alpha reductase inhibitors, Amitriptyline, Gabapentin, and Triamterene   History: Past Medical History:  Diagnosis Date  . Arthritis    "shoulders; wrist; probably spine" (06/24/2013)  . Chronic low back pain    followed by Dr Hardin Negus pain mgt  . Colon polyp   . Constipation   . Esophageal stricture   . Finger pain, left    2 fingers on left hand since wrist surgery  . GERD (gastroesophageal reflux disease)   . Heart murmur    "slight; not on RX" (06/24/2013)  . History of cardiac arrhythmia  cardiologist- Fransico Him  . Hx of colonic polyps 08/28/2004  . Hyperlipemia   . Hypertension   . Hypothyroidism   . Osteoarthritis of left knee    advanced  . PONV (postoperative nausea and vomiting)    Pt reports symptoms are the result of gallbladder and cholecystectomy, not anesthesia  . PVC's (premature ventricular contractions)   . Sleep apnea 1990's   "tested; tried mask; couldn't stand it; told me as long as I slept on my side I'd be ok" (06/24/2013)  . Thyroid nodule   . Tobacco abuse     Past Surgical History:  Procedure Laterality Date  . ABDOMINAL HYSTERECTOMY  1988   "partial" (06/24/2013)  . ABDOMINAL HYSTERECTOMY     partial in 1988  . ANKLE SURGERY Left 1995   "tendon repair" (06/24/2013)  . BACK SURGERY     "think today was my 8th back OR" (06/24/2013)  . CERVICAL SPINE SURGERY  2012  . [place]  . CHOLECYSTECTOMY N/A 04/16/2013   Procedure: LAPAROSCOPIC CHOLECYSTECTOMY ;  Surgeon: Imogene Burn. Georgette Dover, MD;  Location: [facility];  Service: General;  Laterality: N/A;  . ELBOW SURGERY  1990's  . FOOT SURGERY Right 2012   SPUR REMOVED  . INFUSION PUMP IMPLANTATION  1990's   "implantablet morphine pump; took it out w/in 11 months  . KNEE ARTHROSCOPY Left 1991; ~ 1993  . LAPAROSCOPIC LYSIS OF ADHESIONS N/A 04/16/2013   Procedure: LAPAROSCOPIC LYSIS OF ADHESIONS;  Surgeon: Imogene Burn. Georgette Dover, MD;  Location: [facility];  Service: General;  Laterality: N/A;  . LUMBAR FUSION     and rods  . LUMBAR LAMINECTOMY/DECOMPRESSION MICRODISCECTOMY N/A 11/28/2012   Procedure: DECOMPRESSIVE LUMBAR LAMINECTOMY LEVEL 1;  Surgeon: Elaina Hoops, MD;  Location: [facility];  Service: Neurosurgery;  Laterality: N/A;  DECOMPRESSIVE LUMBAR LAMINECTOMY LEVEL 1  . ORIF DISTAL RADIUS FRACTURE Left 12/30/2013   dr Caralyn Guile  . ORIF WRIST FRACTURE Left 12/30/2013   Procedure: OPEN REDUCTION INTERNAL FIXATION (ORIF) LEFT WRIST FRACTURE AND REPAIR AS INDICATED;  Surgeon: Linna Hoff, MD;  Location: [facility];  Service: Orthopedics;  Laterality: Left;  . POSTERIOR FUSION LUMBAR SPINE  06/24/2013  . ROBOTIC ASSISTED SALPINGO OOPHERECTOMY Bilateral 08/20/2014   Procedure: ROBOTIC ASSISTED SALPINGO OOPHORECTOMY;  Surgeon: Daria Pastures, MD;  Location: [facility];  Service: Gynecology;  Laterality: Bilateral;  . SHOULDER ARTHROSCOPY Left 09/2011  . THYROIDECTOMY  2012  . TOTAL HIP ARTHROPLASTY Left 04/27/2019   Procedure: TOTAL HIP ARTHROPLASTY ANTERIOR APPROACH;  Surgeon: Gaynelle Arabian, MD;   Location: [facility];  Service: Orthopedics;  Laterality: Left;  139min  . TOTAL HIP ARTHROPLASTY Right 09/09/2019   Procedure: TOTAL HIP ARTHROPLASTY ANTERIOR APPROACH;  Surgeon: Gaynelle Arabian, MD;  Location: [facility];  Service: Orthopedics;  Laterality: Right;  148min  . TOTAL KNEE ARTHROPLASTY Right 09/09/2017   Procedure: RIGHT TOTAL KNEE ARTHROPLASTY;  Surgeon: Gaynelle Arabian, MD;  Location: [facility];  Service: Orthopedics;  Laterality: Right;   Family History  Problem Relation Age of Onset  . Pancreatic cancer Father   . Cancer Father   . Diabetes type II Mother   . Hypertension Mother   . Coronary artery disease Mother 71  . Diabetes Mother   . Thyroid cancer Mother   . Skin cancer Mother   . Diabetes Sister   . Hypertension Sister   . Bone cancer Sister   . Breast cancer Sister   . Hypertension Brother   . Breast cancer Maternal  Aunt   . Other Neg Hx        No family history of  colon cancer   Social History   Socioeconomic History  . Marital status: Divorced    Spouse name: Not on file  . Number of children: Not on file  . Years of education: Not on file  . Highest education level: Not on file  Occupational History  . Not on file  Tobacco Use  . Smoking status: Current Every Day Smoker    Packs/day: 1.50    Years: 50.00    Pack years: 75.00    Types: Cigarettes  . Smokeless tobacco: Never Used  . Tobacco comment: she has not been able to cut down   Vaping Use  . Vaping Use: Never used  Substance and Sexual Activity  . Alcohol use: No    Alcohol/week: 0.0 standard drinks  . Drug use: No  . Sexual activity: Not Currently  Other Topics Concern  . Not on file  Social History Narrative   Divorced   Current Smoker  1 ppd -  20 yrs      Alcohol use-no       [employer] - laid off       Physician roster:   Dr. Elta Guadeloupe Philips - pain management   Dr. Philis Pique - GYN   Dr. Saintclair Halsted - Neurosurgery   Dr. Ronnald Ramp - dermatology   Dr. Caralyn Guile - orthopedics   Dr.  Fransico Him - cardiology   Dr. Miller-ophthalmology   Social Determinants of Health   Financial Resource Strain: Low Risk   . Difficulty of Paying Living Expenses: Not hard at all  Food Insecurity: No Food Insecurity  . Worried About Charity fundraiser in the Last Year: Never true  . Ran Out of Food in the Last Year: Never true  Transportation Needs: No Transportation Needs  . Lack of Transportation (Medical): No  . Lack of Transportation (Non-Medical): No  Physical Activity: Inactive  . Days of Exercise per Week: 0 days  . Minutes of Exercise per Session: 0 min  Stress: No Stress Concern Present  . Feeling of Stress : Not at all  Social Connections: Socially Isolated  . Frequency of Communication with Friends and Family: More than three times a week  . Frequency of Social Gatherings with Friends and Family: Once a week  . Attends Religious Services: Never  . Active Member of Clubs or Organizations: No  . Attends Archivist Meetings: Never  . Marital Status: Divorced    Tobacco Counseling Ready to quit: Not Answered Counseling given: Not Answered Comment: she has not been able to cut down    Clinical Intake:  Pre-visit preparation completed: Yes        Nutritional Status: BMI > 30  Obese Nutritional Risks: None Diabetes: No  How often do you need to have someone help you when you read instructions, pamphlets, or other written materials from your doctor or pharmacy?: 1 - Never  Diabetic?No  Interpreter Needed?: No  Information entered by :: Caroleen Hamman LPN   Activities of Daily Living In your present state of health, do you have any difficulty performing the following activities: 11/10/2020 11/10/2020  Hearing? N N  Vision? N N  Difficulty concentrating or making decisions? N N  Walking or climbing stairs? N Y  Dressing or bathing? N N  Doing errands, shopping? N N  Preparing Food and eating ? N -  Using the Toilet? N -  In the past six  months, have you accidently leaked urine? N -  Do you have problems with loss of bowel control? N -  Managing your Medications? N -  Managing your Finances? N -  Housekeeping or managing your Housekeeping? N -  Some recent data might be hidden    Patient Care Team: Carollee Herter, Alferd Apa, DO as PCP - General (Family Medicine) O'Neal, Cassie Freer, MD as PCP - Cardiology (Cardiology) Nicholaus Bloom, MD as Consulting Physician (Anesthesiology) Kary Kos, MD as Consulting Physician (Neurosurgery) Iran Planas, MD as Consulting Physician (Orthopedic Surgery) Justice Britain, MD as Consulting Physician (Orthopedic Surgery) Jarome Matin, MD as Consulting Physician (Dermatology) Bobbye Charleston, MD as Consulting Physician (Obstetrics and Gynecology) Barbaraann Cao, OD as Referring Physician (Optometry) Gatha Mayer, MD as Consulting Physician (Gastroenterology) Gaynelle Arabian, MD as Consulting Physician (Orthopedic Surgery) Gaynelle Arabian, MD as Consulting Physician (Orthopedic Surgery) [place], [facility] (Ophthalmology)  Indicate any recent Medical Services you may have received from other than [facility] providers in the past year (date may be approximate).     Assessment:   This is a routine wellness examination for [place].  Hearing/Vision screen  Hearing Screening   125Hz  250Hz  500Hz  1000Hz  2000Hz  3000Hz  4000Hz  6000Hz  8000Hz   Right ear:           Left ear:           Comments: No issues  Vision Screening Comments: Wears glasses Last eye exam-2021  Dietary issues and exercise activities discussed: Current Exercise Habits: The patient does not participate in regular exercise at present, Exercise limited by: orthopedic condition(s)  Goals Addressed            This Visit's Progress   . Increase physical activity   Not on track    Start back doing the exercises for back.       Depression Screen PHQ 2/9 Scores 11/10/2020 11/10/2020 10/12/2019 07/01/2017 05/22/2016  09/28/2015 09/02/2015  PHQ - 2 Score 0 0 0 0 0 3 0  PHQ- 9 Score - - - - - 8 -  Exception Documentation - - - - Patient refusal - Patient refusal    Fall Risk Fall Risk  11/10/2020 11/10/2020 10/12/2019 07/01/2017 05/22/2016  Falls in the past year? 0 0 0 No Yes  Number falls in past yr: 0 0 0 - 1  Injury with Fall? 0 0 0 - Yes  Risk Factor Category  - - - - High Fall Risk  Risk for fall due to : - Impaired balance/gait;Impaired mobility - - Impaired balance/gait  Follow up Falls prevention discussed Falls evaluation completed Education provided;Falls prevention discussed - Falls prevention discussed    FALL RISK PREVENTION PERTAINING TO THE HOME:  Any stairs in or around the home? No  Home free of loose throw rugs in walkways, pet beds, electrical cords, etc? Yes  Adequate lighting in your home to reduce risk of falls? Yes   ASSISTIVE DEVICES UTILIZED TO PREVENT FALLS:  Life alert? No  Use of a cane, walker or w/c? Yes  Grab bars in the bathroom? Yes  Shower chair or bench in shower? No  Elevated toilet seat or a handicapped toilet? No   TIMED UP AND GO:  Was the test performed? Yes .  Length of time to ambulate 10 feet: 11 sec.   Gait slow and steady with assistive device  Cognitive Function:Normal cognitive status assessed by direct observation by this Nurse Health Advisor. No abnormalities found.   MMSE -  Mini Mental State Exam 05/22/2016 09/28/2015  Orientation to time 5 5  Orientation to Place 5 5  Registration 3 3  Attention/ Calculation 5 5  Recall 3 3  Language- name 2 objects 2 2  Language- repeat 1 1  Language- follow 3 step command 3 3  Language- read & follow direction 1 1  Write a sentence 1 1  Copy design 1 1  Total score 30 30        Immunizations Immunization History  Administered Date(s) Administered  . Fluad Quad(high Dose 65+) 04/03/2019, 04/20/2020  . Influenza Split 03/28/2012  . Influenza Whole 04/28/2007, 05/25/2008, 04/19/2009, 04/18/2010   . Influenza, High Dose Seasonal PF 04/13/2014, 04/04/2015, 04/30/2016, 03/14/2017, 04/29/2018  . Influenza,inj,Quad PF,6+ Mos 03/06/2013  . Influenza,inj,quad, With Preservative 04/08/2017  . Influenza-Unspecified 04/08/2018  . Moderna Sars-Covid-2 Vaccination 10/19/2019, 11/16/2019, 05/31/2020  . Pneumococcal Conjugate-13 06/07/2014  . Pneumococcal Polysaccharide-23 08/12/2007, 10/18/2015  . Td 10/23/2004, 09/08/2014  . Zoster 03/28/2012    TDAP status: Up to date  Flu Vaccine status: Up to date  Pneumococcal vaccine status: Up to date  Covid-19 vaccine status: Completed vaccines  Qualifies for Shingles Vaccine? Yes   Zostavax completed Yes   Shingrix Completed?: No.    Education has been provided regarding the importance of this vaccine. Patient has been advised to call insurance company to determine out of pocket expense if they have not yet received this vaccine. Advised may also receive vaccine at local pharmacy or Health Dept. Verbalized acceptance and understanding.  Screening Tests Health Maintenance  Topic Date Due  . COLONOSCOPY (Pts 45-75yrs Insurance coverage will need to be confirmed)  03/27/2020  . MAMMOGRAM  10/22/2020  . COVID-19 Vaccine (4 - Booster for Moderna series) 11/28/2020  . INFLUENZA VACCINE  02/06/2021  . TETANUS/TDAP  09/07/2024  . DEXA SCAN  Completed  . Hepatitis C Screening  Completed  . PNA vac Low Risk Adult  Completed  . HPV VACCINES  Aged Out    Health Maintenance  Health Maintenance Due  Topic Date Due  . COLONOSCOPY (Pts 45-58yrs Insurance coverage will need to be confirmed)  03/27/2020  . MAMMOGRAM  10/22/2020    Colorectal cancer screening: Scheduled for 12/02/2020  Mammogram status: Due-Patient to call & schedule  Bone Density status: Ordered today. Pt provided with contact info and advised to call to schedule appt.  Lung Cancer Screening: (Low Dose CT Chest recommended if Age 51-80 years, 30 pack-year currently smoking OR  have quit w/in 15years.) does not qualify. Chest CT done 04/18/2020    Additional Screening:  Hepatitis C Screening:  Completed 04/04/2015  Vision Screening: Recommended annual ophthalmology exams for early detection of glaucoma and other disorders of the eye. Is the patient up to date with their annual eye exam?  Yes  Who is the provider or what is the name of the office in which the patient attends annual eye exams? Unsure of name    Dental Screening: Recommended annual dental exams for proper oral hygiene  Community Resource Referral / Chronic Care Management: CRR required this visit?  No   CCM required this visit?  No      Plan:     I have personally reviewed and noted the following in the patient's chart:   . Medical and social history . Use of alcohol, tobacco or illicit drugs  . Current medications and supplements including opioid prescriptions.  . Functional ability and status . Nutritional status . Physical activity . Advanced directives .  List of other physicians . Hospitalizations, surgeries, and ER visits in previous 12 months . Vitals . Screenings to include cognitive, depression, and falls . Referrals and appointments  In addition, I have reviewed and discussed with patient certain preventive protocols, quality metrics, and best practice recommendations. A written personalized care plan for preventive services as well as general preventive health recommendations were provided to patient.   Patient to access avs on my chart  Marta Antu, LPN   [id number]  Nurse Health Advisor  Nurse Notes: None

## 2020-11-10 ENCOUNTER — Ambulatory Visit (INDEPENDENT_AMBULATORY_CARE_PROVIDER_SITE_OTHER): Payer: Medicare Other

## 2020-11-10 ENCOUNTER — Encounter: Payer: Self-pay | Admitting: Family Medicine

## 2020-11-10 ENCOUNTER — Ambulatory Visit (INDEPENDENT_AMBULATORY_CARE_PROVIDER_SITE_OTHER): Payer: Medicare Other | Admitting: Family Medicine

## 2020-11-10 VITALS — BP 110/70 | HR 67 | Temp 98.1°F | Resp 18 | Ht 60.0 in | Wt 170.8 lb

## 2020-11-10 VITALS — BP 110/70 | HR 67 | Temp 98.1°F | Resp 18 | Wt 170.0 lb

## 2020-11-10 DIAGNOSIS — F17200 Nicotine dependence, unspecified, uncomplicated: Secondary | ICD-10-CM | POA: Diagnosis not present

## 2020-11-10 DIAGNOSIS — Z0000 Encounter for general adult medical examination without abnormal findings: Secondary | ICD-10-CM

## 2020-11-10 DIAGNOSIS — R739 Hyperglycemia, unspecified: Secondary | ICD-10-CM | POA: Diagnosis not present

## 2020-11-10 DIAGNOSIS — D0471 Carcinoma in situ of skin of right lower limb, including hip: Secondary | ICD-10-CM | POA: Diagnosis not present

## 2020-11-10 DIAGNOSIS — E039 Hypothyroidism, unspecified: Secondary | ICD-10-CM

## 2020-11-10 DIAGNOSIS — E785 Hyperlipidemia, unspecified: Secondary | ICD-10-CM

## 2020-11-10 DIAGNOSIS — J449 Chronic obstructive pulmonary disease, unspecified: Secondary | ICD-10-CM | POA: Diagnosis not present

## 2020-11-10 DIAGNOSIS — R600 Localized edema: Secondary | ICD-10-CM | POA: Diagnosis not present

## 2020-11-10 DIAGNOSIS — I10 Essential (primary) hypertension: Secondary | ICD-10-CM

## 2020-11-10 DIAGNOSIS — E2839 Other primary ovarian failure: Secondary | ICD-10-CM

## 2020-11-10 NOTE — Assessment & Plan Note (Signed)
Encouraged heart healthy diet, increase exercise, avoid trans fats, consider a krill oil cap daily 

## 2020-11-10 NOTE — Assessment & Plan Note (Signed)
Well controlled, no changes to meds. Encouraged heart healthy diet such as the DASH diet and exercise as tolerated.  °

## 2020-11-10 NOTE — Patient Instructions (Signed)
Joan [name] , Thank you for taking time to come for your Medicare Wellness Visit. I appreciate your ongoing commitment to your health goals. Please review the following plan we discussed and let me know if I can assist you in the future.   Screening recommendations/referrals: Colonoscopy: Scheduled for 12/02/20 Mammogram: Per our conversation, you will call to schedule. Bone Density: Ordered today by Dr. Etter Sjogren Recommended yearly ophthalmology/optometry visit for glaucoma screening and checkup Recommended yearly dental visit for hygiene and checkup  Vaccinations: Influenza vaccine: up to date Pneumococcal vaccine: Completed vaccines Tdap vaccine: Up to date-Due-09/07/2024 Shingles vaccine: Discuss with pharmacy   Covid-19:Up to date  Advanced directives: Declined information today  Conditions/risks identified: see problem list  Next appointment: Follow up in one year for your annual wellness visit    Preventive Care 65 Years and Older, Female Preventive care refers to lifestyle choices and visits with your health care provider that can promote health and wellness. What does preventive care include?  A yearly physical exam. This is also called an annual well check.  Dental exams once or twice a year.  Routine eye exams. Ask your health care provider how often you should have your eyes checked.  Personal lifestyle choices, including:  Daily care of your teeth and gums.  Regular physical activity.  Eating a healthy diet.  Avoiding tobacco and drug use.  Limiting alcohol use.  Practicing safe sex.  Taking low-dose aspirin every day.  Taking vitamin and mineral supplements as recommended by your health care provider. What happens during an annual well check? The services and screenings done by your health care provider during your annual well check will depend on your age, overall health, lifestyle risk factors, and family history of disease. Counseling  Your health care  provider may ask you questions about your:  Alcohol use.  Tobacco use.  Drug use.  Emotional well-being.  Home and relationship well-being.  Sexual activity.  Eating habits.  History of falls.  Memory and ability to understand (cognition).  Work and work Statistician.  Reproductive health. Screening  You may have the following tests or measurements:  Height, weight, and BMI.  Blood pressure.  Lipid and cholesterol levels. These may be checked every 5 years, or more frequently if you are over 33 years old.  Skin check.  Lung cancer screening. You may have this screening every year starting at age 30 if you have a 30-pack-year history of smoking and currently smoke or have quit within the past 15 years.  Fecal occult blood test (FOBT) of the stool. You may have this test every year starting at age 40.  Flexible sigmoidoscopy or colonoscopy. You may have a sigmoidoscopy every 5 years or a colonoscopy every 10 years starting at age 99.  Hepatitis C blood test.  Hepatitis B blood test.  Sexually transmitted disease (STD) testing.  Diabetes screening. This is done by checking your blood sugar (glucose) after you have not eaten for a while (fasting). You may have this done every 1-3 years.  Bone density scan. This is done to screen for osteoporosis. You may have this done starting at age 22.  Mammogram. This may be done every 1-2 years. Talk to your health care provider about how often you should have regular mammograms. Talk with your health care provider about your test results, treatment options, and if necessary, the need for more tests. Vaccines  Your health care provider may recommend certain vaccines, such as:  Influenza vaccine. This is recommended  every year.  Tetanus, diphtheria, and acellular pertussis (Tdap, Td) vaccine. You may need a Td booster every 10 years.  Zoster vaccine. You may need this after age 19.  Pneumococcal 13-valent conjugate (PCV13)  vaccine. One dose is recommended after age 57.  Pneumococcal polysaccharide (PPSV23) vaccine. One dose is recommended after age 23. Talk to your health care provider about which screenings and vaccines you need and how often you need them. This information is not intended to replace advice given to you by your health care provider. Make sure you discuss any questions you have with your health care provider. Document Released: 07/22/2015 Document Revised: 03/14/2016 Document Reviewed: 04/26/2015 Elsevier Interactive Patient Education  2017 [place] Prevention in the Home Falls can cause injuries. They can happen to people of all ages. There are many things you can do to make your home safe and to help prevent falls. What can I do on the outside of my home?  Regularly fix the edges of walkways and driveways and fix any cracks.  Remove anything that might make you trip as you walk through a door, such as a raised step or threshold.  Trim any bushes or trees on the path to your home.  Use bright outdoor lighting.  Clear any walking paths of anything that might make someone trip, such as rocks or tools.  Regularly check to see if handrails are loose or broken. Make sure that both sides of any steps have handrails.  Any raised decks and porches should have guardrails on the edges.  Have any leaves, snow, or ice cleared regularly.  Use sand or salt on walking paths during winter.  Clean up any spills in your garage right away. This includes oil or grease spills. What can I do in the bathroom?  Use night lights.  Install grab bars by the toilet and in the tub and shower. Do not use towel bars as grab bars.  Use non-skid mats or decals in the tub or shower.  If you need to sit down in the shower, use a plastic, non-slip stool.  Keep the floor dry. Clean up any water that spills on the floor as soon as it happens.  Remove soap buildup in the tub or shower  regularly.  Attach bath mats securely with double-sided non-slip rug tape.  Do not have throw rugs and other things on the floor that can make you trip. What can I do in the bedroom?  Use night lights.  Make sure that you have a light by your bed that is easy to reach.  Do not use any sheets or blankets that are too big for your bed. They should not hang down onto the floor.  Have a firm chair that has side arms. You can use this for support while you get dressed.  Do not have throw rugs and other things on the floor that can make you trip. What can I do in the kitchen?  Clean up any spills right away.  Avoid walking on wet floors.  Keep items that you use a lot in easy-to-reach places.  If you need to reach something above you, use a strong step stool that has a grab bar.  Keep electrical cords out of the way.  Do not use floor polish or wax that makes floors slippery. If you must use wax, use non-skid floor wax.  Do not have throw rugs and other things on the floor that can make you trip. What  can I do with my stairs?  Do not leave any items on the stairs.  Make sure that there are handrails on both sides of the stairs and use them. Fix handrails that are broken or loose. Make sure that handrails are as long as the stairways.  Check any carpeting to make sure that it is firmly attached to the stairs. Fix any carpet that is loose or worn.  Avoid having throw rugs at the top or bottom of the stairs. If you do have throw rugs, attach them to the floor with carpet tape.  Make sure that you have a light switch at the top of the stairs and the bottom of the stairs. If you do not have them, ask someone to add them for you. What else can I do to help prevent falls?  Wear shoes that:  Do not have high heels.  Have rubber bottoms.  Are comfortable and fit you well.  Are closed at the toe. Do not wear sandals.  If you use a stepladder:  Make sure that it is fully  opened. Do not climb a closed stepladder.  Make sure that both sides of the stepladder are locked into place.  Ask someone to hold it for you, if possible.  Clearly mark and make sure that you can see:  Any grab bars or handrails.  First and last steps.  Where the edge of each step is.  Use tools that help you move around (mobility aids) if they are needed. These include:  Canes.  Walkers.  Scooters.  Crutches.  Turn on the lights when you go into a dark area. Replace any light bulbs as soon as they burn out.  Set up your furniture so you have a clear path. Avoid moving your furniture around.  If any of your floors are uneven, fix them.  If there are any pets around you, be aware of where they are.  Review your medicines with your doctor. Some medicines can make you feel dizzy. This can increase your chance of falling. Ask your doctor what other things that you can do to help prevent falls. This information is not intended to replace advice given to you by your health care provider. Make sure you discuss any questions you have with your health care provider. Document Released: 04/21/2009 Document Revised: 12/01/2015 Document Reviewed: 07/30/2014 Elsevier Interactive Patient Education  2017 [employer].

## 2020-11-10 NOTE — Assessment & Plan Note (Signed)
Check tsh today Sees endo

## 2020-11-10 NOTE — Patient Instructions (Signed)
Preventive Care 76 Years and Older, Female Preventive care refers to lifestyle choices and visits with your health care provider that can promote health and wellness. This includes:  A yearly physical exam. This is also called an annual wellness visit.  Regular dental and eye exams.  Immunizations.  Screening for certain conditions.  Healthy lifestyle choices, such as: ? Eating a healthy diet. ? Getting regular exercise. ? Not using drugs or products that contain nicotine and tobacco. ? Limiting alcohol use. What can I expect for my preventive care visit? Physical exam Your health care provider will check your:  Height and weight. These may be used to calculate your BMI (body mass index). BMI is a measurement that tells if you are at a healthy weight.  Heart rate and blood pressure.  Body temperature.  Skin for abnormal spots. Counseling Your health care provider may ask you questions about your:  Past medical problems.  Family's medical history.  Alcohol, tobacco, and drug use.  Emotional well-being.  Home life and relationship well-being.  Sexual activity.  Diet, exercise, and sleep habits.  History of falls.  Memory and ability to understand (cognition).  Work and work environment.  Pregnancy and menstrual history.  Access to firearms. What immunizations do I need? Vaccines are usually given at various ages, according to a schedule. Your health care provider will recommend vaccines for you based on your age, medical history, and lifestyle or other factors, such as travel or where you work.   What tests do I need? Blood tests  Lipid and cholesterol levels. These may be checked every 5 years, or more often depending on your overall health.  Hepatitis C test.  Hepatitis B test. Screening  Lung cancer screening. You may have this screening every year starting at age 76 if you have a 30-pack-year history of smoking and currently smoke or have quit within  the past 15 years.  Colorectal cancer screening. ? All adults should have this screening starting at age 76 and continuing until age 76. ? Your health care provider may recommend screening at age 76 if you are at increased risk. ? You will have tests every 1-10 years, depending on your results and the type of screening test.  Diabetes screening. ? This is done by checking your blood sugar (glucose) after you have not eaten for a while (fasting). ? You may have this done every 1-3 years.  Mammogram. ? This may be done every 1-2 years. ? Talk with your health care provider about how often you should have regular mammograms.  Abdominal aortic aneurysm (AAA) screening. You may need this if you are a current or former smoker.  BRCA-related cancer screening. This may be done if you have a family history of breast, ovarian, tubal, or peritoneal cancers. Other tests  STD (sexually transmitted disease) testing, if you are at risk.  Bone density scan. This is done to screen for osteoporosis. You may have this done starting at age 76. Talk with your health care provider about your test results, treatment options, and if necessary, the need for more tests. Follow these instructions at home: Eating and drinking  Eat a diet that includes fresh fruits and vegetables, whole grains, lean protein, and low-fat dairy products. Limit your intake of foods with high amounts of sugar, saturated fats, and salt.  Take vitamin and mineral supplements as recommended by your health care provider.  Do not drink alcohol if your health care provider tells you not to drink.    If you drink alcohol: ? Limit how much you have to 0-1 drink a day. ? Be aware of how much alcohol is in your drink. In the [place] one drink equals one 12 oz bottle of beer (355 mL), one 5 oz glass of wine (148 mL), or one 1 oz glass of hard liquor (44 mL).   Lifestyle  Take daily care of your teeth and gums. Brush your teeth every morning  and night with fluoride toothpaste. Floss one time each day.  Stay active. Exercise for at least 30 minutes 5 or more days each week.  Do not use any products that contain nicotine or tobacco, such as cigarettes, e-cigarettes, and chewing tobacco. If you need help quitting, ask your health care provider.  Do not use drugs.  If you are sexually active, practice safe sex. Use a condom or other form of protection in order to prevent STIs (sexually transmitted infections).  Talk with your health care provider about taking a low-dose aspirin or statin.  Find healthy ways to cope with stress, such as: ? Meditation, yoga, or listening to music. ? Journaling. ? Talking to a trusted person. ? Spending time with friends and family. Safety  Always wear your seat belt while driving or riding in a vehicle.  Do not drive: ? If you have been drinking alcohol. Do not ride with someone who has been drinking. ? When you are tired or distracted. ? While texting.  Wear a helmet and other protective equipment during sports activities.  If you have firearms in your house, make sure you follow all gun safety procedures. What's next?  Visit your health care provider once a year for an annual wellness visit.  Ask your health care provider how often you should have your eyes and teeth checked.  Stay up to date on all vaccines. This information is not intended to replace advice given to you by your health care provider. Make sure you discuss any questions you have with your health care provider. Document Revised: 06/15/2020 Document Reviewed: 06/19/2018 Elsevier Patient Education  2021 [employer]  

## 2020-11-10 NOTE — Progress Notes (Signed)
Subjective:   By signing my name below, I, [name], attest that this documentation has been prepared under the direction and in the presence of Dr. Roma Schanz, DO. 11/10/2020    Patient ID: Joan [name], female    DOB: 02/28/1946, 75 y.o.   MRN: [id number]  Chief Complaint  Patient presents with  . Annual Exam    Pt states fasting     HPI Patient is in today for a comprehensive physical exam. She is requesting to have her blood sugar checked because her last 2 measurements showed higher than 140. She recently had eye surgery and complains of unclear vision. She is also experiencing headache secondary to her eye pain. Her eye issues is being managed by her optometrist at this time. 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 Her skin issues is being managed by her dermatologist.  She is also complaining of knee pain. She is delaying her knee replacement surgery until her eye has healed well. She denies having any fever, ear pain, congestion, sinus pain, sore throat, eye pain, chest pain, palpations, cough, shortness of breath, wheezing, nausea, vomiting, diarrhea, constipation, blood in stool, dysuria, frequency, hematuria, dizziness, or headaches at this time. She has had 3 Covid 19 vaccinations and is willing to get the fourth vaccination. She continues to smoke one and half packs of cigarettes daily. She is willing to quit smoking but is not willing to receive help to manage it at this time.    Past Medical History:  Diagnosis Date  . Arthritis    "shoulders; wrist; probably spine" (06/24/2013)  . Chronic low back pain    followed by Dr Hardin Negus pain mgt  . Colon polyp   . Constipation   . Esophageal stricture   . Finger pain, left    2 fingers on left hand since wrist surgery  . GERD (gastroesophageal reflux disease)   . Heart murmur    "slight; not on RX" (06/24/2013)  . History of cardiac arrhythmia    cardiologist-  Traci Turner  . Hx of colonic polyps 08/28/2004  . Hyperlipemia   . Hypertension   . Hypothyroidism   . Osteoarthritis of left knee    advanced  . PONV (postoperative nausea and vomiting)    Pt reports symptoms are the result of gallbladder and cholecystectomy, not anesthesia  . PVC's (premature ventricular contractions)   . Sleep apnea 1990's   "tested; tried mask; couldn't stand it; told me as long as I slept on my side I'd be ok" (06/24/2013)  . Thyroid nodule   . Tobacco abuse     Past Surgical History:  Procedure Laterality Date  . ABDOMINAL HYSTERECTOMY  1988   "partial" (06/24/2013)  . ABDOMINAL HYSTERECTOMY     partial in 1988  . ANKLE SURGERY Left 1995   "tendon repair" (06/24/2013)  . BACK SURGERY     "think today was my 8th back OR" (06/24/2013)  . CERVICAL SPINE SURGERY  2012  . [facility]  . CHOLECYSTECTOMY N/A 04/16/2013   Procedure: LAPAROSCOPIC CHOLECYSTECTOMY ;  Surgeon: Imogene Burn. Georgette Dover, MD;  Location: [facility];  Service: General;  Laterality: N/A;  . ELBOW SURGERY  1990's  . FOOT SURGERY Right 2012   SPUR REMOVED  . INFUSION PUMP IMPLANTATION  1990's   "implantablet morphine pump; took it out w/in 11 months  . KNEE ARTHROSCOPY Left 1991; ~ 1993  . LAPAROSCOPIC LYSIS OF ADHESIONS N/A 04/16/2013  Procedure: LAPAROSCOPIC LYSIS OF ADHESIONS;  Surgeon: Imogene Burn. Georgette Dover, MD;  Location: [facility];  Service: General;  Laterality: N/A;  . LUMBAR FUSION     and rods  . LUMBAR LAMINECTOMY/DECOMPRESSION MICRODISCECTOMY N/A 11/28/2012   Procedure: DECOMPRESSIVE LUMBAR LAMINECTOMY LEVEL 1;  Surgeon: Elaina Hoops, MD;  Location: [facility];  Service: Neurosurgery;  Laterality: N/A;  DECOMPRESSIVE LUMBAR LAMINECTOMY LEVEL 1  . ORIF DISTAL RADIUS FRACTURE Left 12/30/2013   dr Caralyn Guile  . ORIF WRIST FRACTURE Left 12/30/2013   Procedure: OPEN REDUCTION INTERNAL FIXATION (ORIF) LEFT WRIST FRACTURE AND REPAIR AS INDICATED;  Surgeon: Linna Hoff, MD;  Location: [facility];   Service: Orthopedics;  Laterality: Left;  . POSTERIOR FUSION LUMBAR SPINE  06/24/2013  . ROBOTIC ASSISTED SALPINGO OOPHERECTOMY Bilateral 08/20/2014   Procedure: ROBOTIC ASSISTED SALPINGO OOPHORECTOMY;  Surgeon: Daria Pastures, MD;  Location: [facility];  Service: Gynecology;  Laterality: Bilateral;  . SHOULDER ARTHROSCOPY Left 09/2011  . THYROIDECTOMY  2012  . TOTAL HIP ARTHROPLASTY Left 04/27/2019   Procedure: TOTAL HIP ARTHROPLASTY ANTERIOR APPROACH;  Surgeon: Gaynelle Arabian, MD;  Location: [facility];  Service: Orthopedics;  Laterality: Left;  1100min  . TOTAL HIP ARTHROPLASTY Right 09/09/2019   Procedure: TOTAL HIP ARTHROPLASTY ANTERIOR APPROACH;  Surgeon: Gaynelle Arabian, MD;  Location: [facility];  Service: Orthopedics;  Laterality: Right;  172min  . TOTAL KNEE ARTHROPLASTY Right 09/09/2017   Procedure: RIGHT TOTAL KNEE ARTHROPLASTY;  Surgeon: Gaynelle Arabian, MD;  Location: [facility];  Service: Orthopedics;  Laterality: Right;    Family History  Problem Relation Age of Onset  . Pancreatic cancer Father   . Cancer Father   . Diabetes type II Mother   . Hypertension Mother   . Coronary artery disease Mother 61  . Diabetes Mother   . Thyroid cancer Mother   . Skin cancer Mother   . Diabetes Sister   . Hypertension Sister   . Bone cancer Sister   . Breast cancer Sister   . Hypertension Brother   . Breast cancer Maternal Aunt   . Other Neg Hx        No family history of  colon cancer    Social History   Socioeconomic History  . Marital status: Divorced    Spouse name: Not on file  . Number of children: Not on file  . Years of education: Not on file  . Highest education level: Not on file  Occupational History  . Not on file  Tobacco Use  . Smoking status: Current Every Day Smoker    Packs/day: 1.50    Years: 50.00    Pack years: 75.00    Types: Cigarettes  . Smokeless tobacco: Never Used  . Tobacco comment: she has not been able to cut down   Vaping Use  . Vaping Use: Never used   Substance and Sexual Activity  . Alcohol use: No    Alcohol/week: 0.0 standard drinks  . Drug use: No  . Sexual activity: Not Currently  Other Topics Concern  . Not on file  Social History Narrative   Divorced   Current Smoker  1 ppd -  20 yrs      Alcohol use-no       [employer] - laid off       Physician roster:   Dr. Elta Guadeloupe Philips - pain management   Dr. Philis Pique - GYN   Dr. Saintclair Halsted - Neurosurgery   Dr. Ronnald Ramp - dermatology   Dr.  Caralyn Guile - orthopedics   Dr. Fransico Him - cardiology   Dr. Miller-ophthalmology   Social Determinants of Health   Financial Resource Strain: Low Risk   . Difficulty of Paying Living Expenses: Not hard at all  Food Insecurity: No Food Insecurity  . Worried About Charity fundraiser in the Last Year: Never true  . Ran Out of Food in the Last Year: Never true  Transportation Needs: No Transportation Needs  . Lack of Transportation (Medical): No  . Lack of Transportation (Non-Medical): No  Physical Activity: Inactive  . Days of Exercise per Week: 0 days  . Minutes of Exercise per Session: 0 min  Stress: No Stress Concern Present  . Feeling of Stress : Not at all  Social Connections: Socially Isolated  . Frequency of Communication with Friends and Family: More than three times a week  . Frequency of Social Gatherings with Friends and Family: Once a week  . Attends Religious Services: Never  . Active Member of Clubs or Organizations: No  . Attends Archivist Meetings: Never  . Marital Status: Divorced  Human resources officer Violence: Not At Risk  . Fear of Current or Ex-Partner: No  . Emotionally Abused: No  . Physically Abused: No  . Sexually Abused: No    Outpatient Medications Prior to Visit  Medication Sig Dispense Refill  . albuterol (VENTOLIN HFA) 108 (90 Base) MCG/ACT inhaler Inhale 2 puffs into the lungs every 6 (six) hours as needed for wheezing or shortness of breath. 6.7 g 2  . bisacodyl (DULCOLAX) 5 MG EC  tablet Take 5-10 mg by mouth daily as needed for mild constipation.  30 tablet   . carisoprodol (SOMA) 350 MG tablet Take 175 mg by mouth 3 (three) times daily.     . clobetasol cream (TEMOVATE) 4.40 % Apply 1 application topically 2 (two) times daily. 30 g 0  . dorzolamide-timolol (COSOPT) 22.3-6.8 MG/ML ophthalmic solution Place 1 drop into the left eye 2 (two) times daily.    Marland Kitchen estradiol (ESTRACE) 0.5 MG tablet Take 0.5 mg by mouth at bedtime.   4  . furosemide (LASIX) 40 MG tablet Take 1 tablet (40 mg total) by mouth daily. 30 tablet 3  . hydrochlorothiazide (HYDRODIURIL) 25 MG tablet TAKE 1 TABLET BY MOUTH  DAILY 90 tablet 3  . levothyroxine (SYNTHROID) 88 MCG tablet TAKE BY MOUTH AS DIRECTED. TAKE ONE TABLET BY MOUTH MONDAY THROUGH SATURDAY, TAKE 1.5 TABLETS ON SUNDAY 64 tablet 1  . NONFORMULARY OR COMPOUNDED ITEM Compression socks  20-30 mm/ hg   Dx low ext edema 1 each 1  . oxyCODONE-acetaminophen (PERCOCET) 10-325 MG tablet Take 1 tablet by mouth every 4 (four) hours as needed for pain.     . potassium chloride SA (KLOR-CON) 20 MEQ tablet Take 2 tablets (40 mEq total) by mouth daily. 120 tablet 1  . simvastatin (ZOCOR) 20 MG tablet TAKE 1 TABLET BY MOUTH AT  BEDTIME 90 tablet 3  . traZODone (DESYREL) 50 MG tablet Take 1 tablet (50 mg total) by mouth at bedtime. 90 tablet 1  . spironolactone (ALDACTONE) 25 MG tablet Take 0.5 tablets (12.5 mg total) by mouth daily. 30 tablet 3  . augmented betamethasone dipropionate (DIPROLENE-AF) 0.05 % cream Apply topically. (Patient not taking: Reported on 11/10/2020)     No facility-administered medications prior to visit.    Allergies  Allergen Reactions  . Morphine And Related Swelling  . 5-Alpha Reductase Inhibitors   . Amitriptyline Swelling  Leg swelling  . Gabapentin Itching  . Triamterene Itching and Rash    Review of Systems  Constitutional: Negative for fever.  HENT: Negative for congestion, ear pain, sinus pain and sore throat.    Eyes: Negative for pain.  Respiratory: Negative for cough, shortness of breath and wheezing.   Cardiovascular: Negative for chest pain and palpitations.  Gastrointestinal: Negative for blood in stool, constipation, diarrhea, nausea and vomiting.  Genitourinary: Negative for dysuria, frequency and hematuria.  Neurological: Negative for dizziness and headaches.       Objective:    Physical Exam Vitals and nursing note reviewed.  Constitutional:      General: She is not in acute distress.    Appearance: Normal appearance. She is well-developed.  HENT:     Head: Normocephalic and atraumatic.     Right Ear: Tympanic membrane and external ear normal.     Left Ear: Tympanic membrane and external ear normal.     Nose: Nose normal.  Eyes:     Pupils: Pupils are equal, round, and reactive to light.  Cardiovascular:     Rate and Rhythm: Normal rate and regular rhythm.     Pulses: Normal pulses.     Heart sounds: Normal heart sounds. No murmur heard.   Pulmonary:     Effort: Pulmonary effort is normal. No respiratory distress.     Breath sounds: Normal breath sounds. No wheezing, rhonchi or rales.  Chest:     Chest wall: No tenderness.  Breasts:     Right: Normal.     Left: Normal.    Abdominal:     General: Bowel sounds are normal. There is no distension.     Palpations: There is no mass.     Tenderness: There is no abdominal tenderness. There is no guarding or rebound.  Musculoskeletal:        General: Tenderness present. No swelling.     Cervical back: Normal range of motion and neck supple.     Comments: Knee pain from replacement   Skin:    General: Skin is warm and dry.  Neurological:     Mental Status: She is alert and oriented to person, place, and time.  Psychiatric:        Behavior: Behavior normal.        Thought Content: Thought content normal.        Judgment: Judgment normal.     BP 110/70 (BP Location: Right Arm, Patient Position: Sitting, Cuff Size:  Large)   Pulse 67   Temp 98.1 F (36.7 C) (Oral)   Resp 18   Ht 5' (1.524 m)   Wt 170 lb 12.8 oz (77.5 kg)   SpO2 97%   BMI 33.36 kg/m  Wt Readings from Last 3 Encounters:  11/10/20 170 lb (77.1 kg)  11/10/20 170 lb 12.8 oz (77.5 kg)  06/16/20 177 lb (80.3 kg)    Diabetic Foot Exam - Simple   No data filed    Lab Results  Component Value Date   WBC 5.2 12/01/2019   HGB 12.8 12/01/2019   HCT 37.9 12/01/2019   PLT 206.0 12/01/2019   GLUCOSE 118 (H) 06/16/2020   CHOL 155 06/16/2020   TRIG 94.0 06/16/2020   HDL 60.40 06/16/2020   LDLDIRECT 106.0 11/29/2015   LDLCALC 75 06/16/2020   ALT 13 06/16/2020   AST 18 06/16/2020   NA 138 06/16/2020   K 3.5 06/16/2020   CL 96 06/16/2020   CREATININE 1.19 06/16/2020  BUN 18 06/16/2020   CO2 35 (H) 06/16/2020   TSH 2.98 06/16/2020   INR 1.0 09/03/2019   HGBA1C 6.3 06/16/2020    Lab Results  Component Value Date   TSH 2.98 06/16/2020   Lab Results  Component Value Date   WBC 5.2 12/01/2019   HGB 12.8 12/01/2019   HCT 37.9 12/01/2019   MCV 87.6 12/01/2019   PLT 206.0 12/01/2019   Lab Results  Component Value Date   NA 138 06/16/2020   K 3.5 06/16/2020   CO2 35 (H) 06/16/2020   GLUCOSE 118 (H) 06/16/2020   BUN 18 06/16/2020   CREATININE 1.19 06/16/2020   BILITOT 0.5 06/16/2020   ALKPHOS 48 06/16/2020   AST 18 06/16/2020   ALT 13 06/16/2020   PROT 6.7 06/16/2020   ALBUMIN 4.3 06/16/2020   CALCIUM 8.9 06/16/2020   ANIONGAP 6 09/11/2019   GFR 44.70 (L) 06/16/2020   Lab Results  Component Value Date   CHOL 155 06/16/2020   Lab Results  Component Value Date   HDL 60.40 06/16/2020   Lab Results  Component Value Date   LDLCALC 75 06/16/2020   Lab Results  Component Value Date   TRIG 94.0 06/16/2020   Lab Results  Component Value Date   CHOLHDL 3 06/16/2020   Lab Results  Component Value Date   HGBA1C 6.3 06/16/2020   Colonoscopy- Last completed 03/28/2015. Results showed 2 polyps ranging from 4  to 7 mm in size were found at the cecum in the transverse colon, otherwise the results are normal. Mammogram- Last completed 05/20/2020. Long term stability of 2 circumscribe masses in lower portion of the right breast correlating well with CT findings, otherwise results are normal. Repeat in April, 2022. Dexa- Last completed 05/25/2015. Results showed osteoporosis. Repeat in 2 years. Pap Smear- Last completed 09/26/2010. Results are normal.     Assessment & Plan:   Problem List Items Addressed This Visit      Unprioritized   COPD  GOLD II (barely) / active smoker    Per pulmonary Discussed smoking cessation with pt       Hyperlipidemia    Encouraged heart healthy diet, increase exercise, avoid trans fats, consider a krill oil cap daily      Hypothyroidism    Check tsh today Sees endo       Relevant Orders   TSH   Lower extremity edema    Stable con't diuretics      Relevant Orders   Lipid panel   Comprehensive metabolic panel   CBC with Differential/Platelet   TSH   Preventative health care - Primary    ghm utd Check labs  See avs       Primary hypertension    Well controlled, no changes to meds. Encouraged heart healthy diet such as the DASH diet and exercise as tolerated.       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   IAnn Held, DO, personally preformed the services described in this documentation.  All medical record entries made by the scribe were at my direction and in  my presence.  I have reviewed the chart and discharge instructions (if applicable) and agree that the record reflects my personal performance and is accurate and complete. 11/10/2020   I,[name],acting as a scribe for [name], DO.,have documented all relevant documentation on the behalf of [name], DO,as directed by  [name], DO while in the presence of [name], DO.   [name], DO

## 2020-11-10 NOTE — Assessment & Plan Note (Signed)
Stable con't diuretics

## 2020-11-10 NOTE — Assessment & Plan Note (Signed)
ghm utd Check labs  See avs  

## 2020-11-10 NOTE — Assessment & Plan Note (Signed)
Per pulmonary Discussed smoking cessation with pt

## 2020-11-10 NOTE — Assessment & Plan Note (Signed)
D/w pt  Offered beh health referral to help but pt wants to wait until her eye problem is worked out

## 2020-11-11 LAB — CBC WITH DIFFERENTIAL/PLATELET
Basophils Absolute: 0.1 10*3/uL (ref 0.0–0.1)
Basophils Relative: 1.2 % (ref 0.0–3.0)
Eosinophils Absolute: 0.1 10*3/uL (ref 0.0–0.7)
Eosinophils Relative: 1.4 % (ref 0.0–5.0)
HCT: 40.3 % (ref 36.0–46.0)
Hemoglobin: 13.7 g/dL (ref 12.0–15.0)
Lymphocytes Relative: 25.4 % (ref 12.0–46.0)
Lymphs Abs: 1.2 10*3/uL (ref 0.7–4.0)
MCHC: 34 g/dL (ref 30.0–36.0)
MCV: 87.9 fl (ref 78.0–100.0)
Monocytes Absolute: 0.6 10*3/uL (ref 0.1–1.0)
Monocytes Relative: 12 % (ref 3.0–12.0)
Neutro Abs: 2.9 10*3/uL (ref 1.4–7.7)
Neutrophils Relative %: 60 % (ref 43.0–77.0)
Platelets: 185 10*3/uL (ref 150.0–400.0)
RBC: 4.58 Mil/uL (ref 3.87–5.11)
RDW: 13.4 % (ref 11.5–15.5)
WBC: 4.8 10*3/uL (ref 4.0–10.5)

## 2020-11-11 LAB — COMPREHENSIVE METABOLIC PANEL
ALT: 15 U/L (ref 0–35)
AST: 17 U/L (ref 0–37)
Albumin: 4.2 g/dL (ref 3.5–5.2)
Alkaline Phosphatase: 88 U/L (ref 39–117)
BUN: 18 mg/dL (ref 6–23)
CO2: 32 mEq/L (ref 19–32)
Calcium: 8.8 mg/dL (ref 8.4–10.5)
Chloride: 94 mEq/L — ABNORMAL LOW (ref 96–112)
Creatinine, Ser: 0.92 mg/dL (ref 0.40–1.20)
GFR: 60.7 mL/min (ref >60.00)
Glucose, Bld: 125 mg/dL — ABNORMAL HIGH (ref 70–99)
Potassium: 3.2 mEq/L — ABNORMAL LOW (ref 3.5–5.1)
Sodium: 138 mEq/L (ref 135–145)
Total Bilirubin: 0.5 mg/dL (ref 0.2–1.2)
Total Protein: 6.7 g/dL (ref 6.0–8.3)

## 2020-11-11 LAB — LIPID PANEL
Cholesterol: 178 mg/dL (ref 0–200)
HDL: 64.9 mg/dL (ref >39.00)
LDL Cholesterol: 78 mg/dL (ref 0–99)
NonHDL: 113.17
Total CHOL/HDL Ratio: 3
Triglycerides: 177 mg/dL — ABNORMAL HIGH (ref 0.0–149.0)
VLDL: 35.4 mg/dL (ref 0.0–40.0)

## 2020-11-11 LAB — TSH: TSH: 0.57 u[IU]/mL (ref 0.35–4.50)

## 2020-11-11 LAB — HEMOGLOBIN A1C: Hgb A1c MFr Bld: 6.5 % (ref 4.6–6.5)

## 2020-11-15 ENCOUNTER — Other Ambulatory Visit: Payer: Self-pay | Admitting: Family Medicine

## 2020-11-15 DIAGNOSIS — Z1231 Encounter for screening mammogram for malignant neoplasm of breast: Secondary | ICD-10-CM

## 2020-11-18 ENCOUNTER — Other Ambulatory Visit: Payer: Self-pay

## 2020-11-18 ENCOUNTER — Ambulatory Visit (AMBULATORY_SURGERY_CENTER): Payer: Medicare Other | Admitting: *Deleted

## 2020-11-18 VITALS — Ht 60.0 in | Wt 170.0 lb

## 2020-11-18 DIAGNOSIS — Z86010 Personal history of colonic polyps: Secondary | ICD-10-CM

## 2020-11-18 MED ORDER — ONDANSETRON HCL 4 MG PO TABS
4.0000 mg | ORAL_TABLET | ORAL | 0 refills | Status: DC
Start: 2020-11-18 — End: 2021-05-18

## 2020-11-18 NOTE — Progress Notes (Signed)
Pt verified name, DOB, address and insurance during PV today. Pt mailed instruction packet to included paper to complete and mail back to [facility] with addressed and stamped envelope, Emmi video, copy of consent form to read and not return, and instructions. PV completed over the phone. Pt encouraged to call with questions or issues.  My Chart instructions to pt as well    No egg or soy allergy known to patient  issues with past sedation with any surgeries or procedures- PONV  Patient denies ever being told they had issues or difficulty with intubation  No FH of Malignant Hyperthermia No diet pills per patient No home 02 use per patient  No blood thinners per patient  Pt states  issues with constipation due to pain meds- will do a 2 day Miralax / Miralax prep  No A fib or A flutter  EMMI video to pt or via [place] 19 guidelines implemented in PV today with Pt and RN  Pt is fully vaccinated  for Covid    Due to the COVID-19 pandemic we are asking patients to follow certain guidelines.  Pt aware of COVID protocols and [facility] guidelines

## 2020-11-23 ENCOUNTER — Other Ambulatory Visit: Payer: Self-pay | Admitting: Cardiovascular Disease

## 2020-11-28 ENCOUNTER — Encounter: Payer: Self-pay | Admitting: Internal Medicine

## 2020-11-28 NOTE — Progress Notes (Signed)
Cardiology Office Note:   Date:  11/30/2020  NAME:  Joan [name]    MRN: [id number] DOB:  Mar 08, 1946   PCP:  [name], DO  Cardiologist:  Evalina Field, MD  Electrophysiologist:  None   Referring MD: Carollee Herter, Alferd Apa, *   Chief Complaint  Patient presents with  . Follow-up   History of Present Illness:   Joan [name] is a 75 y.o. female with a hx of COPD, venous insufficiency, HLD, COPD who presents for follow-up.  She reports she is doing well.  Weights are stable.  Lower extremity edema is stable on Lasix.  She is on HCTZ but not 1 to come off this.  Potassium was low at her most recent PCP visit.  We will refill this today.  BP 130/74.  She has seen dermatology for venous dermatitis.  She denies any chest pain or significant shortness of breath.  Still smoking.  She reports that she had recent eye surgery and is not wanting to stop smoking at this time.  She will hopefully stop in the near future.  Her most recent A1c shows she is now diabetic at 6.5.  She was given the option of medications versus conservative management.  For now she will modify her diet.  She denies any symptoms in office today.  Problem List 1. HTN 2. Tobacco abuse -50 pack year history  3. Venous insufficiency/venous dermatitis  -negative DVT study 03/2019 -BNP 13 -EF 55-60% no WMA on my review 4.Hyperlipidemia -Total cholesterol 171, HDL 62, LDL 87, triglycerides 104 5. COPD -moderate 6. DM -A1c 6.5  Past Medical History: Past Medical History:  Diagnosis Date  . Arthritis    "shoulders; wrist; probably spine" (06/24/2013)  . Blood transfusion without reported diagnosis   . Cancer ([place])    skin cancer right leg x 4    . Cataract    removed both eyes   . Chronic low back pain    followed by Dr Hardin Negus pain mgt  . Colon polyp   . Constipation   . COPD (chronic obstructive pulmonary disease) ([facility])    mild  . Esophageal stricture   . Finger pain, left    2 fingers  on left hand since wrist surgery  . GERD (gastroesophageal reflux disease)   . Glaucoma   . Heart murmur    "slight; not on RX" (06/24/2013)  . History of cardiac arrhythmia    cardiologist- Traci Turner  . Hx of colonic polyps 08/28/2004  . Hyperlipemia   . Hypertension   . Hypothyroidism   . Osteoarthritis of left knee    advanced  . PONV (postoperative nausea and vomiting)    Pt reports symptoms are the result of gallbladder and cholecystectomy, not anesthesia  . PVC's (premature ventricular contractions)   . Sleep apnea 1990's   "tested; tried mask; couldn't stand it; told me as long as I slept on my side I'd be ok" (06/24/2013)  . Thyroid nodule   . Tobacco abuse     Past Surgical History: Past Surgical History:  Procedure Laterality Date  . ABDOMINAL HYSTERECTOMY  1988   "partial" (06/24/2013)  . ABDOMINAL HYSTERECTOMY     partial in 1988  . ANKLE SURGERY Left 1995   "tendon repair" (06/24/2013)  . BACK SURGERY     "think today was my 8th back OR" (06/24/2013)  . CERVICAL SPINE SURGERY  2012  . [place]  . CHOLECYSTECTOMY N/A 04/16/2013  Procedure: LAPAROSCOPIC CHOLECYSTECTOMY ;  Surgeon: Imogene Burn. Georgette Dover, MD;  Location: [facility];  Service: General;  Laterality: N/A;  . COLONOSCOPY    . ELBOW SURGERY  1990's  . FOOT SURGERY Right 2012   SPUR REMOVED  . INFUSION PUMP IMPLANTATION  1990's   "implantablet morphine pump; took it out w/in 11 months  . KNEE ARTHROSCOPY Left 1991; ~ 1993  . LAPAROSCOPIC LYSIS OF ADHESIONS N/A 04/16/2013   Procedure: LAPAROSCOPIC LYSIS OF ADHESIONS;  Surgeon: Imogene Burn. Georgette Dover, MD;  Location: [facility];  Service: General;  Laterality: N/A;  . LUMBAR FUSION     and rods  . LUMBAR LAMINECTOMY/DECOMPRESSION MICRODISCECTOMY N/A 11/28/2012   Procedure: DECOMPRESSIVE LUMBAR LAMINECTOMY LEVEL 1;  Surgeon: Elaina Hoops, MD;  Location: [facility];  Service: Neurosurgery;  Laterality: N/A;  DECOMPRESSIVE LUMBAR LAMINECTOMY LEVEL 1  . ORIF  DISTAL RADIUS FRACTURE Left 12/30/2013   dr Caralyn Guile  . ORIF WRIST FRACTURE Left 12/30/2013   Procedure: OPEN REDUCTION INTERNAL FIXATION (ORIF) LEFT WRIST FRACTURE AND REPAIR AS INDICATED;  Surgeon: Linna Hoff, MD;  Location: [facility];  Service: Orthopedics;  Laterality: Left;  . POLYPECTOMY    . POSTERIOR FUSION LUMBAR SPINE  06/24/2013  . ROBOTIC ASSISTED SALPINGO OOPHERECTOMY Bilateral 08/20/2014   Procedure: ROBOTIC ASSISTED SALPINGO OOPHORECTOMY;  Surgeon: Daria Pastures, MD;  Location: [facility];  Service: Gynecology;  Laterality: Bilateral;  . SHOULDER ARTHROSCOPY Left 09/2011  . THYROIDECTOMY  2012  . TOTAL HIP ARTHROPLASTY Left 04/27/2019   Procedure: TOTAL HIP ARTHROPLASTY ANTERIOR APPROACH;  Surgeon: Gaynelle Arabian, MD;  Location: [facility];  Service: Orthopedics;  Laterality: Left;  160min  . TOTAL HIP ARTHROPLASTY Right 09/09/2019   Procedure: TOTAL HIP ARTHROPLASTY ANTERIOR APPROACH;  Surgeon: Gaynelle Arabian, MD;  Location: [facility];  Service: Orthopedics;  Laterality: Right;  152min  . TOTAL KNEE ARTHROPLASTY Right 09/09/2017   Procedure: RIGHT TOTAL KNEE ARTHROPLASTY;  Surgeon: Gaynelle Arabian, MD;  Location: [facility];  Service: Orthopedics;  Laterality: Right;  . UPPER GASTROINTESTINAL ENDOSCOPY    . VITRECTOMY Right 10/18/2020    Current Medications: Current Meds  Medication Sig  . albuterol (VENTOLIN HFA) 108 (90 Base) MCG/ACT inhaler Inhale 2 puffs into the lungs every 6 (six) hours as needed for wheezing or shortness of breath.  . bisacodyl (DULCOLAX) 5 MG EC tablet Take 5-10 mg by mouth daily as needed for mild constipation.   . carisoprodol (SOMA) 350 MG tablet Take 175 mg by mouth 3 (three) times daily.   . dorzolamide-timolol (COSOPT) 22.3-6.8 MG/ML ophthalmic solution Place 1 drop into the left eye 2 (two) times daily.  Marland Kitchen estradiol (ESTRACE) 0.5 MG tablet Take 0.5 mg by mouth at bedtime.   . fluocinonide ointment (LIDEX) 5.64 % Apply 1 application topically 2 (two) times  daily.  . furosemide (LASIX) 40 MG tablet Take 1 tablet (40 mg total) by mouth daily.  . hydrochlorothiazide (HYDRODIURIL) 25 MG tablet TAKE 1 TABLET BY MOUTH  DAILY  . levothyroxine (SYNTHROID) 88 MCG tablet TAKE BY MOUTH AS DIRECTED. TAKE ONE TABLET BY MOUTH MONDAY THROUGH SATURDAY, TAKE 1.5 TABLETS ON SUNDAY  . NONFORMULARY OR COMPOUNDED ITEM Compression socks  20-30 mm/ hg   Dx low ext edema  . ondansetron (ZOFRAN) 4 MG tablet Take 1 tablet (4 mg total) by mouth as directed.  Marland Kitchen oxyCODONE-acetaminophen (PERCOCET) 10-325 MG tablet Take 1 tablet by mouth every 4 (four) hours as needed for pain.   . simvastatin (ZOCOR) 20 MG tablet  TAKE 1 TABLET BY MOUTH AT  BEDTIME  . spironolactone (ALDACTONE) 25 MG tablet Take 1/2 (one-half) tablet by mouth once daily  . traZODone (DESYREL) 50 MG tablet Take 1 tablet (50 mg total) by mouth at bedtime.  . triamcinolone cream (KENALOG) 0.1 % Apply topically.  . [DISCONTINUED] potassium chloride SA (KLOR-CON) 20 MEQ tablet Take 2 tablets (40 mEq total) by mouth daily.     Allergies:    Morphine and related, 5-alpha reductase inhibitors, Amitriptyline, Gabapentin, and Triamterene   Social History: Social History   Socioeconomic History  . Marital status: Divorced    Spouse name: Not on file  . Number of children: Not on file  . Years of education: Not on file  . Highest education level: Not on file  Occupational History  . Not on file  Tobacco Use  . Smoking status: Current Every Day Smoker    Packs/day: 1.50    Years: 50.00    Pack years: 75.00    Types: Cigarettes  . Smokeless tobacco: Never Used  . Tobacco comment: she has not been able to cut down   Vaping Use  . Vaping Use: Never used  Substance and Sexual Activity  . Alcohol use: No    Alcohol/week: 0.0 standard drinks  . Drug use: No  . Sexual activity: Not Currently  Other Topics Concern  . Not on file  Social History Narrative   Divorced   Current Smoker  1 ppd -  20 yrs       Alcohol use-no       [employer] - laid off       Physician roster:   Dr. Elta Guadeloupe Philips - pain management   Dr. Philis Pique - GYN   Dr. Saintclair Halsted - Neurosurgery   Dr. Ronnald Ramp - dermatology   Dr. Caralyn Guile - orthopedics   Dr. Fransico Him - cardiology   Dr. Miller-ophthalmology   Social Determinants of Health   Financial Resource Strain: Low Risk   . Difficulty of Paying Living Expenses: Not hard at all  Food Insecurity: No Food Insecurity  . Worried About Charity fundraiser in the Last Year: Never true  . Ran Out of Food in the Last Year: Never true  Transportation Needs: No Transportation Needs  . Lack of Transportation (Medical): No  . Lack of Transportation (Non-Medical): No  Physical Activity: Inactive  . Days of Exercise per Week: 0 days  . Minutes of Exercise per Session: 0 min  Stress: No Stress Concern Present  . Feeling of Stress : Not at all  Social Connections: Socially Isolated  . Frequency of Communication with Friends and Family: More than three times a week  . Frequency of Social Gatherings with Friends and Family: Once a week  . Attends Religious Services: Never  . Active Member of Clubs or Organizations: No  . Attends Archivist Meetings: Never  . Marital Status: Divorced     Family History: The patient's family history includes Bone cancer in her sister; Breast cancer in her maternal aunt and sister; Cancer in her father; Coronary artery disease (age of onset: 55) in her mother; Diabetes in her mother and sister; Diabetes type II in her mother; Hypertension in her brother, mother, and sister; Pancreatic cancer in her father; Skin cancer in her mother; Thyroid cancer in her mother. There is no history of Other, Colon cancer, Colon polyps, Esophageal cancer, Stomach cancer, or Rectal cancer.  ROS:   All other ROS reviewed and  negative. Pertinent positives noted in the HPI.     EKGs/Labs/Other Studies Reviewed:   The following studies were  personally reviewed by me today:  LE Venous US 04/27/2020 Summary:  Right:  - No evidence of deep vein thrombosis seen in the right lower extremity,  from the common femoral through the popliteal veins.  - No evidence of superficial venous thrombosis in the right lower  extremity.  - Deep vein reflux in the CFV, FV.  _ Superficial vein reflux in the GSV at the knee.    Left:  - No evidence of deep vein thrombosis seen in the left lower extremity,  from the common femoral through the popliteal veins.  - No evidence of superficial venous thrombosis in the left lower  extremity.  - Deep vein reflux in the CFV.  - Superficial vein reflux in the SSV and in the GSV in the proximal calf.   TTE 12/25/2019 1. MIld inferior basal hypokinesis Prominent epicaridal fat. Left  ventricular ejection fraction, by estimation, is 50 to 55%. The left  ventricle has low normal function. The left ventricle demonstrates  regional wall motion abnormalities (see scoring  diagram/findings for description). Left ventricular diastolic parameters  are consistent with Grade I diastolic dysfunction (impaired relaxation).  2. Right ventricular systolic function is normal. The right ventricular  size is normal.  3. The mitral valve is normal in structure. No evidence of mitral valve  regurgitation. No evidence of mitral stenosis.  4. The aortic valve was not well visualized. Aortic valve regurgitation  is not visualized. Mild aortic valve sclerosis is present, with no  evidence of aortic valve stenosis.  5. The inferior vena cava is normal in size with greater than 50%  respiratory variability, suggesting right atrial pressure of 3 mmHg.   Recent Labs: 12/29/2019: BNP 9.0 11/10/2020: ALT 15; BUN 18; Creatinine, Ser 0.92; Hemoglobin 13.7; Platelets 185.0; Potassium 3.2; Sodium 138; TSH 0.57   Recent Lipid Panel    Component Value Date/Time   CHOL 178 11/10/2020 1448   TRIG 177.0 (H) 11/10/2020 1448    HDL 64.90 11/10/2020 1448   CHOLHDL 3 11/10/2020 1448   VLDL 35.4 11/10/2020 1448   LDLCALC 78 11/10/2020 1448   LDLCALC 89 03/17/2020 1416   LDLDIRECT 106.0 11/29/2015 1515    Physical Exam:   VS:  BP 130/74   Pulse 81   Ht 5' (1.524 m)   Wt 170 lb 12.8 oz (77.5 kg)   SpO2 99%   BMI 33.36 kg/m    Wt Readings from Last 3 Encounters:  11/30/20 170 lb 12.8 oz (77.5 kg)  11/18/20 170 lb (77.1 kg)  11/10/20 170 lb (77.1 kg)    General: Well nourished, well developed, in no acute distress Head: Atraumatic, normal size  Eyes: PEERLA, EOMI  Neck: Supple, no JVD Endocrine: No thryomegaly Cardiac: Normal S1, S2; RRR; no murmurs, rubs, or gallops Lungs: Clear to auscultation bilaterally, no wheezing, rhonchi or rales  Abd: Soft, nontender, no hepatomegaly  Ext: Venous insufficiency changes noted, dermatitis noted Musculoskeletal: No deformities, BUE and BLE strength normal and equal Skin: Warm and dry, no rashes   Neuro: Alert and oriented to person, place, time, and situation, CNII-XII grossly intact, no focal deficits  Psych: Normal mood and affect   ASSESSMENT:   Joan [name] is a 75 y.o. female who presents for the following: 1. Venous insufficiency   2. Leg swelling   3. Shortness of breath   4. Essential hypertension  5. Tobacco abuse   6. Hypokalemia     PLAN:   1. Venous insufficiency 2. Leg swelling -Known history of venous insufficiency with venous dermatitis.  I recommended continue leg elevation.  She takes Lasix daily.  She also takes HCTZ.  Needs to be careful with potassium.  This was refilled today.  We also have her on Aldactone to help raise her potassium.  3. Shortness of breath -Likely related to COPD.  No evidence of heart failure.  Normal echo.  Normal BNP.  4. Essential hypertension -No change in medications.  5. Tobacco abuse -Still smoking.  Not wanting to quit.  6. Hypokalemia -Potassium refill today.  Disposition: Return in about 1  year (around 11/30/2021).  Medication Adjustments/Labs and Tests Ordered: Current medicines are reviewed at length with the patient today.  Concerns regarding medicines are outlined above.  No orders of the defined types were placed in this encounter.  Meds ordered this encounter  Medications  . potassium chloride SA (KLOR-CON) 20 MEQ tablet    Sig: Take 2 tablets (40 mEq total) by mouth daily.    Dispense:  180 tablet    Refill:  3    Patient Instructions  Medication Instructions:  The current medical regimen is effective;  continue present plan and medications.  *If you need a refill on your cardiac medications before your next appointment, please call your pharmacy*   Follow-Up: At [facility], you and your health needs are our priority.  As part of our continuing mission to provide you with exceptional heart care, we have created designated Provider Care Teams.  These Care Teams include your primary Cardiologist (physician) and Advanced Practice Providers (APPs -  Physician Assistants and Nurse Practitioners) who all work together to provide you with the care you need, when you need it.  We recommend signing up for the patient portal called "MyChart".  Sign up information is provided on this After Visit Summary.  MyChart is used to connect with patients for Virtual Visits (Telemedicine).  Patients are able to view lab/test results, encounter notes, upcoming appointments, etc.  Non-urgent messages can be sent to your provider as well.   To learn more about what you can do with MyChart, go to [email or web address].    Your next appointment:   12 month(s)  The format for your next appointment:   In Person  Provider:   Eleonore Chiquito, MD        Time Spent with Patient: I have spent a total of 25 minutes with patient reviewing hospital notes, telemetry, EKGs, labs and examining the patient as well as establishing an assessment and plan that was discussed with the patient.   > 50% of time was spent in direct patient care.  Signed, Addison Naegeli. Audie Box, MD, [facility]  [address] [place], [place] [address] [phone]  11/30/2020 1:51 PM

## 2020-11-30 ENCOUNTER — Other Ambulatory Visit: Payer: Self-pay

## 2020-11-30 ENCOUNTER — Ambulatory Visit: Payer: Medicare Other | Admitting: Cardiovascular Disease

## 2020-11-30 ENCOUNTER — Encounter: Payer: Self-pay | Admitting: Cardiovascular Disease

## 2020-11-30 VITALS — BP 130/74 | HR 81 | Ht 60.0 in | Wt 170.8 lb

## 2020-11-30 DIAGNOSIS — M7989 Other specified soft tissue disorders: Secondary | ICD-10-CM | POA: Diagnosis not present

## 2020-11-30 DIAGNOSIS — Z720 Tobacco use: Secondary | ICD-10-CM | POA: Diagnosis not present

## 2020-11-30 DIAGNOSIS — I872 Venous insufficiency (chronic) (peripheral): Secondary | ICD-10-CM | POA: Diagnosis not present

## 2020-11-30 DIAGNOSIS — I10 Essential (primary) hypertension: Secondary | ICD-10-CM

## 2020-11-30 DIAGNOSIS — E876 Hypokalemia: Secondary | ICD-10-CM

## 2020-11-30 DIAGNOSIS — R0602 Shortness of breath: Secondary | ICD-10-CM

## 2020-11-30 MED ORDER — POTASSIUM CHLORIDE CRYS ER 20 MEQ PO TBCR: 40.0000 meq | EXTENDED_RELEASE_TABLET | Freq: Every day | ORAL | 3 refills | Status: DC

## 2020-11-30 NOTE — Patient Instructions (Signed)

## 2020-12-01 ENCOUNTER — Telehealth: Payer: Self-pay | Admitting: Cardiovascular Disease

## 2020-12-01 NOTE — Telephone Encounter (Signed)
*  STAT* If patient is at the pharmacy, call can be transferred to refill team.   1. Which medications need to be refilled? (please list name of each medication and dose if known)potassium chloride SA (KLOR-CON) 20 MEQ   2. Which pharmacy/location (including street and city if local pharmacy) is medication to be sent to? [place], [place] [address]  3. Do they need a 30 day or 90 day supply? 30 DAY SUPPLY

## 2020-12-02 ENCOUNTER — Other Ambulatory Visit: Payer: Self-pay

## 2020-12-02 ENCOUNTER — Encounter: Payer: Self-pay | Admitting: Internal Medicine

## 2020-12-02 ENCOUNTER — Ambulatory Visit (AMBULATORY_SURGERY_CENTER): Payer: Medicare Other | Admitting: Internal Medicine

## 2020-12-02 VITALS — BP 133/72 | HR 71 | Temp 97.3°F | Resp 17 | Ht 60.0 in | Wt 170.2 lb

## 2020-12-02 DIAGNOSIS — D122 Benign neoplasm of ascending colon: Secondary | ICD-10-CM

## 2020-12-02 DIAGNOSIS — D123 Benign neoplasm of transverse colon: Secondary | ICD-10-CM | POA: Diagnosis not present

## 2020-12-02 DIAGNOSIS — D125 Benign neoplasm of sigmoid colon: Secondary | ICD-10-CM | POA: Diagnosis not present

## 2020-12-02 DIAGNOSIS — D127 Benign neoplasm of rectosigmoid junction: Secondary | ICD-10-CM | POA: Diagnosis not present

## 2020-12-02 DIAGNOSIS — D124 Benign neoplasm of descending colon: Secondary | ICD-10-CM | POA: Diagnosis not present

## 2020-12-02 DIAGNOSIS — Z860100 Personal history of colon polyps, unspecified: Secondary | ICD-10-CM

## 2020-12-02 DIAGNOSIS — D120 Benign neoplasm of cecum: Secondary | ICD-10-CM | POA: Diagnosis not present

## 2020-12-02 DIAGNOSIS — D128 Benign neoplasm of rectum: Secondary | ICD-10-CM

## 2020-12-02 MED ORDER — SODIUM CHLORIDE 0.9 % IV SOLN: 500.0000 mL | Freq: Once | INTRAVENOUS | Status: DC

## 2020-12-02 NOTE — Op Note (Signed)
[facility] Patient Name: Joan [name] Procedure Date: 12/02/2020 1:31 PM MRN: [id number] Endoscopist: Gatha Mayer , MD Age: 75 Referring MD:  Date of Birth: [birth year] Gender: Female Account #: [id number] Procedure:                Colonoscopy Indications:              Surveillance: Personal history of adenomatous                            polyps on last colonoscopy > 5 years ago Medicines:                Propofol per Anesthesia, Monitored Anesthesia Care Procedure:                Pre-Anesthesia Assessment:                           - Prior to the procedure, a History and Physical                            was performed, and patient medications and                            allergies were reviewed. The patient's tolerance of                            previous anesthesia was also reviewed. The risks                            and benefits of the procedure and the sedation                            options and risks were discussed with the patient.                            All questions were answered, and informed consent                            was obtained. Prior Anticoagulants: The patient has                            taken no previous anticoagulant or antiplatelet                            agents. ASA Grade Assessment: II - A patient with                            mild systemic disease. After reviewing the risks                            and benefits, the patient was deemed in                            satisfactory condition to undergo the procedure.  After obtaining informed consent, the colonoscope                            was passed under direct vision. Throughout the                            procedure, the patient's blood pressure, pulse, and                            oxygen saturations were monitored continuously. The                            Olympus PFC-[id number] (#[id number]) Colonoscope was                             introduced through the anus and advanced to the the                            cecum, identified by appendiceal orifice and                            ileocecal valve. The colonoscopy was performed                            without difficulty. The patient tolerated the                            procedure well. The quality of the bowel                            preparation was good. The ileocecal valve,                            appendiceal orifice, and rectum were photographed. Scope In: 1:40:05 PM Scope Out: 2:02:53 PM Scope Withdrawal Time: 0 hours 19 minutes 26 seconds  Total Procedure Duration: 0 hours 22 minutes 48 seconds  Findings:                 The perianal and digital rectal examinations were                            normal.                           Ten sessile polyps were found in the rectum,                            sigmoid colon, descending colon, transverse colon,                            ascending colon and cecum. The polyps were                            diminutive in size. These polyps were removed with  a cold snare. Resection and retrieval were                            complete. Verification of patient identification                            for the specimen was done. Estimated blood loss was                            minimal.                           Multiple diverticula were found in the sigmoid                            colon.                           Internal hemorrhoids were found.                           The exam was otherwise without abnormality on                            direct and retroflexion views. Complications:            No immediate complications. Estimated Blood Loss:     Estimated blood loss was minimal. Impression:               - Ten diminutive polyps in the rectum, in the                            sigmoid colon, in the descending colon, in the                            transverse colon, in the  ascending colon and in the                            cecum, removed with a cold snare. Resected and                            retrieved.                           - Diverticulosis in the sigmoid colon.                           - Internal hemorrhoids.                           - The examination was otherwise normal on direct                            and retroflexion views.                           - Personal history of colonic  polyps.                           2006 adenoma                           2011 3 adenomas max 7 mm                           03/28/2015 2 small polyps removed - adenomas Recommendation:           - Patient has a contact number available for                            emergencies. The signs and symptoms of potential                            delayed complications were discussed with the                            patient. Return to normal activities tomorrow.                            Written discharge instructions were provided to the                            patient.                           - Resume previous diet.                           - Continue present medications.                           - No repeat colonoscopy due to age. Gatha Mayer, MD 12/02/2020 2:13:25 PM This report has been signed electronically.

## 2020-12-02 NOTE — Progress Notes (Signed)
A/ox3, pleased with MAC, report to RN 

## 2020-12-02 NOTE — Progress Notes (Signed)
Called to room to assist during endoscopic procedure.  Patient ID and intended procedure confirmed with present staff. Received instructions for my participation in the procedure from the performing physician.  

## 2020-12-02 NOTE — Patient Instructions (Addendum)
I removed 10 tiny polyps today - all look benign.  I do not think I will recommend another routine colonoscopy.  I appreciate the opportunity to care for you. Gatha Mayer, MD, [facility]   Handouts given for polyps, diverticulosis and hemorrhoids.  YOU HAD AN ENDOSCOPIC PROCEDURE TODAY AT [facility]:   Refer to the procedure report that was given to you for any specific questions about what was found during the examination.  If the procedure report does not answer your questions, please call your gastroenterologist to clarify.  If you requested that your care partner not be given the details of your procedure findings, then the procedure report has been included in a sealed envelope for you to review at your convenience later.  YOU SHOULD EXPECT: Some feelings of bloating in the abdomen. Passage of more gas than usual.  Walking can help get rid of the air that was put into your GI tract during the procedure and reduce the bloating. If you had a lower endoscopy (such as a colonoscopy or flexible sigmoidoscopy) you may notice spotting of blood in your stool or on the toilet paper. If you underwent a bowel prep for your procedure, you may not have a normal bowel movement for a few days.  Please Note:  You might notice some irritation and congestion in your nose or some drainage.  This is from the oxygen used during your procedure.  There is no need for concern and it should clear up in a day or so.  SYMPTOMS TO REPORT IMMEDIATELY:   Following lower endoscopy (colonoscopy or flexible sigmoidoscopy):  Excessive amounts of blood in the stool  Significant tenderness or worsening of abdominal pains  Swelling of the abdomen that is new, acute  Fever of 100F or higher  For urgent or emergent issues, a gastroenterologist can be reached at any hour by calling [phone]. Do not use MyChart messaging for urgent concerns.    DIET:  We do recommend a small meal at first, but then  you may proceed to your regular diet.  Drink plenty of fluids but you should avoid alcoholic beverages for 24 hours.  ACTIVITY:  You should plan to take it easy for the rest of today and you should NOT DRIVE or use heavy machinery until tomorrow (because of the sedation medicines used during the test).    FOLLOW UP: Our staff will call the number listed on your records 48-72 hours following your procedure to check on you and address any questions or concerns that you may have regarding the information given to you following your procedure. If we do not reach you, we will leave a message.  We will attempt to reach you two times.  During this call, we will ask if you have developed any symptoms of COVID 19. If you develop any symptoms (ie: fever, flu-like symptoms, shortness of breath, cough etc.) before then, please call [phone].  If you test positive for Covid 19 in the 2 weeks post procedure, please call and report this information to [place].    If any biopsies were taken you will be contacted by phone or by letter within the next 1-3 weeks.  Please call us at [phone] if you have not heard about the biopsies in 3 weeks.    SIGNATURES/CONFIDENTIALITY: You and/or your care partner have signed paperwork which will be entered into your electronic medical record.  These signatures attest to the fact that that the information above on  your After Visit Summary has been reviewed and is understood.  Full responsibility of the confidentiality of this discharge information lies with you and/or your care-partner. 

## 2020-12-06 ENCOUNTER — Telehealth: Payer: Self-pay | Admitting: *Deleted

## 2020-12-06 ENCOUNTER — Telehealth: Payer: Self-pay

## 2020-12-06 NOTE — Telephone Encounter (Signed)
  Follow up Call-  Call back number 12/02/2020  Post procedure Call Back phone  # [phone]  Permission to leave phone message Yes  Some recent data might be hidden     1st follow up call made.  NALM

## 2020-12-06 NOTE — Telephone Encounter (Signed)
  Follow up Call-  Call back number 12/02/2020  Post procedure Call Back phone  # [phone]  Permission to leave phone message Yes  Some recent data might be hidden     Patient questions:  Do you have a fever, pain , or abdominal swelling? No. Pain Score  0 *  Have you tolerated food without any problems? Yes.    Have you been able to return to your normal activities? Yes.    Do you have any questions about your discharge instructions: Diet   No. Medications  No. Follow up visit  No.  Do you have questions or concerns about your Care? No.  Actions: * If pain score is 4 or above: No action needed, pain <4.  1. Have you developed a fever since your procedure? no  2.   Have you had an respiratory symptoms (SOB or cough) since your procedure? no  3.   Have you tested positive for COVID 19 since your procedure no  4.   Have you had any family members/close contacts diagnosed with the COVID 19 since your procedure?  no   If yes to any of these questions please route to Joylene John, RN and Joella Prince, RN

## 2020-12-07 DIAGNOSIS — H35341 Macular cyst, hole, or pseudohole, right eye: Secondary | ICD-10-CM | POA: Diagnosis not present

## 2020-12-08 DIAGNOSIS — M62830 Muscle spasm of back: Secondary | ICD-10-CM | POA: Diagnosis not present

## 2020-12-08 DIAGNOSIS — Z79891 Long term (current) use of opiate analgesic: Secondary | ICD-10-CM | POA: Diagnosis not present

## 2020-12-08 DIAGNOSIS — G894 Chronic pain syndrome: Secondary | ICD-10-CM | POA: Diagnosis not present

## 2020-12-08 DIAGNOSIS — M961 Postlaminectomy syndrome, not elsewhere classified: Secondary | ICD-10-CM | POA: Diagnosis not present

## 2020-12-12 ENCOUNTER — Other Ambulatory Visit: Payer: Self-pay | Admitting: Family Medicine

## 2020-12-12 DIAGNOSIS — R600 Localized edema: Secondary | ICD-10-CM

## 2020-12-13 DIAGNOSIS — L218 Other seborrheic dermatitis: Secondary | ICD-10-CM | POA: Diagnosis not present

## 2020-12-13 DIAGNOSIS — Z85828 Personal history of other malignant neoplasm of skin: Secondary | ICD-10-CM | POA: Diagnosis not present

## 2020-12-13 DIAGNOSIS — L308 Other specified dermatitis: Secondary | ICD-10-CM | POA: Diagnosis not present

## 2020-12-16 ENCOUNTER — Encounter: Payer: Self-pay | Admitting: Internal Medicine

## 2021-01-04 ENCOUNTER — Other Ambulatory Visit: Payer: Self-pay | Admitting: Family Medicine

## 2021-01-04 DIAGNOSIS — E785 Hyperlipidemia, unspecified: Secondary | ICD-10-CM

## 2021-01-12 ENCOUNTER — Ambulatory Visit
Admission: RE | Admit: 2021-01-12 | Discharge: 2021-01-12 | Disposition: A | Discharge status: Home / Self Care | Payer: Medicare Other | Source: Ambulatory Visit | Attending: Family Medicine | Admitting: Family Medicine

## 2021-01-12 ENCOUNTER — Other Ambulatory Visit: Payer: Self-pay

## 2021-01-12 DIAGNOSIS — Z1231 Encounter for screening mammogram for malignant neoplasm of breast: Secondary | ICD-10-CM | POA: Diagnosis not present

## 2021-01-28 ENCOUNTER — Other Ambulatory Visit: Payer: Self-pay | Admitting: Family Medicine

## 2021-01-28 DIAGNOSIS — G4700 Insomnia, unspecified: Secondary | ICD-10-CM

## 2021-02-02 DIAGNOSIS — Z79891 Long term (current) use of opiate analgesic: Secondary | ICD-10-CM | POA: Diagnosis not present

## 2021-02-02 DIAGNOSIS — M961 Postlaminectomy syndrome, not elsewhere classified: Secondary | ICD-10-CM | POA: Diagnosis not present

## 2021-02-02 DIAGNOSIS — G894 Chronic pain syndrome: Secondary | ICD-10-CM | POA: Diagnosis not present

## 2021-02-02 DIAGNOSIS — M62830 Muscle spasm of back: Secondary | ICD-10-CM | POA: Diagnosis not present

## 2021-02-07 ENCOUNTER — Other Ambulatory Visit: Payer: Self-pay | Admitting: Family Medicine

## 2021-02-14 DIAGNOSIS — L308 Other specified dermatitis: Secondary | ICD-10-CM | POA: Diagnosis not present

## 2021-03-10 DIAGNOSIS — H35341 Macular cyst, hole, or pseudohole, right eye: Secondary | ICD-10-CM | POA: Diagnosis not present

## 2021-03-10 DIAGNOSIS — H43822 Vitreomacular adhesion, left eye: Secondary | ICD-10-CM | POA: Diagnosis not present

## 2021-03-30 DIAGNOSIS — M961 Postlaminectomy syndrome, not elsewhere classified: Secondary | ICD-10-CM | POA: Diagnosis not present

## 2021-03-30 DIAGNOSIS — G894 Chronic pain syndrome: Secondary | ICD-10-CM | POA: Diagnosis not present

## 2021-03-30 DIAGNOSIS — Z79891 Long term (current) use of opiate analgesic: Secondary | ICD-10-CM | POA: Diagnosis not present

## 2021-03-30 DIAGNOSIS — M62830 Muscle spasm of back: Secondary | ICD-10-CM | POA: Diagnosis not present

## 2021-04-12 DIAGNOSIS — L308 Other specified dermatitis: Secondary | ICD-10-CM | POA: Diagnosis not present

## 2021-04-12 DIAGNOSIS — L821 Other seborrheic keratosis: Secondary | ICD-10-CM | POA: Diagnosis not present

## 2021-04-12 DIAGNOSIS — L570 Actinic keratosis: Secondary | ICD-10-CM | POA: Diagnosis not present

## 2021-04-12 DIAGNOSIS — I872 Venous insufficiency (chronic) (peripheral): Secondary | ICD-10-CM | POA: Diagnosis not present

## 2021-04-12 DIAGNOSIS — L309 Dermatitis, unspecified: Secondary | ICD-10-CM | POA: Diagnosis not present

## 2021-04-18 DIAGNOSIS — H16141 Punctate keratitis, right eye: Secondary | ICD-10-CM | POA: Diagnosis not present

## 2021-04-21 ENCOUNTER — Ambulatory Visit: Payer: Medicare Other | Admitting: Internal Medicine

## 2021-05-01 ENCOUNTER — Other Ambulatory Visit: Payer: Self-pay | Admitting: Family Medicine

## 2021-05-01 DIAGNOSIS — G4700 Insomnia, unspecified: Secondary | ICD-10-CM

## 2021-05-12 ENCOUNTER — Ambulatory Visit: Payer: Medicare Other | Admitting: Family Medicine

## 2021-05-18 ENCOUNTER — Encounter: Payer: Self-pay | Admitting: Family Medicine

## 2021-05-18 ENCOUNTER — Other Ambulatory Visit: Payer: Self-pay

## 2021-05-18 ENCOUNTER — Ambulatory Visit (INDEPENDENT_AMBULATORY_CARE_PROVIDER_SITE_OTHER): Payer: Medicare Other | Admitting: Family Medicine

## 2021-05-18 VITALS — BP 120/90 | HR 75 | Temp 97.8°F | Resp 20 | Ht 60.0 in | Wt 166.4 lb

## 2021-05-18 DIAGNOSIS — E039 Hypothyroidism, unspecified: Secondary | ICD-10-CM | POA: Diagnosis not present

## 2021-05-18 DIAGNOSIS — J069 Acute upper respiratory infection, unspecified: Secondary | ICD-10-CM | POA: Diagnosis not present

## 2021-05-18 DIAGNOSIS — Z23 Encounter for immunization: Secondary | ICD-10-CM

## 2021-05-18 DIAGNOSIS — E785 Hyperlipidemia, unspecified: Secondary | ICD-10-CM | POA: Diagnosis not present

## 2021-05-18 DIAGNOSIS — I10 Essential (primary) hypertension: Secondary | ICD-10-CM | POA: Diagnosis not present

## 2021-05-18 DIAGNOSIS — U071 COVID-19: Secondary | ICD-10-CM | POA: Diagnosis not present

## 2021-05-18 LAB — POC COVID19 BINAXNOW: SARS Coronavirus 2 Ag: POSITIVE — AB

## 2021-05-18 MED ORDER — SPIRONOLACTONE 25 MG PO TABS: ORAL_TABLET | ORAL | 3 refills | Status: DC

## 2021-05-18 MED ORDER — FLUTICASONE PROPIONATE 50 MCG/ACT NA SUSP: 2.0000 | Freq: Every day | NASAL | 6 refills | Status: DC

## 2021-05-18 MED ORDER — MOLNUPIRAVIR EUA 200MG CAPSULE: 4.0000 | ORAL_CAPSULE | Freq: Two times a day (BID) | ORAL | 0 refills | Status: AC

## 2021-05-18 NOTE — Progress Notes (Signed)
Subjective:   By signing my name below, I, [name], attest that this documentation has been prepared under the direction and in the presence of Dr. Roma Schanz, DO. 05/18/2021    Patient ID: Joan [name], female    DOB: 31-Dec-1945, 75 y.o.   MRN: [id number]  Chief Complaint  Patient presents with   Hypertension   Hyperlipidemia   Hypothyroidism   Follow-up    HPI Patient is in today for a office visit.   Congestion- She complains of congestion. She is not taking any medication to manage it at this time. She is interested in taking nasal spray to manage her symptoms. She did not complete a Covid-19 test prior to this visit.  Right eye- She reports having a procedure completed in her right eye. She reports having a hole in it. She continues having pain with her right eye. She seen her vision specialist and found she had a cold sore on her eye. She was given steroid eye drops to manage it and found relief. She has ran out recently and found that her symptoms are returning. She has left a message for her vision specialist for a refill but has no heard back yet.  Blood pressure- She continues taking 12.5 mg spironolactone daily PO and reports no new issues while taking it. She is requesting a refill on it as well.   BP Readings from Last 3 Encounters:  05/18/21 120/90  12/02/20 133/72  11/30/20 130/74   Pulse Readings from Last 3 Encounters:  05/18/21 75  12/02/20 71  11/30/20 81   Pre-cancer skin spots- She reports having 3 precancer skin spots removed on her legs. She continues having swelling in her legs. She notes that she could not elevated her legs for long periods when recovering from her eye procedure.  Immunizations- She has 3 moderna Covid-19 vaccines. She is receiving a flu vaccine during this visit. She is interested in receiving the bivalent Covid-19 vaccine.    Past Medical History:  Diagnosis Date   Arthritis    "shoulders; wrist; probably spine"  (06/24/2013)   Blood transfusion without reported diagnosis    Cancer (Combine)    skin cancer right leg x 4     Cataract    removed both eyes    Chronic low back pain    followed by Dr Hardin Negus pain mgt   Colon polyp    Constipation    COPD (chronic obstructive pulmonary disease) (HCC)    mild   Esophageal stricture    Finger pain, left    2 fingers on left hand since wrist surgery   GERD (gastroesophageal reflux disease)    Glaucoma    Heart murmur    "slight; not on RX" (06/24/2013)   History of cardiac arrhythmia    cardiologist- Traci Turner   Hx of colonic polyps 08/28/2004   Hyperlipemia    Hypertension    Hypothyroidism    Osteoarthritis of left knee    advanced   PONV (postoperative nausea and vomiting)    Pt reports symptoms are the result of gallbladder and cholecystectomy, not anesthesia   PVC's (premature ventricular contractions)    Sleep apnea 1990's   "tested; tried mask; couldn't stand it; told me as long as I slept on my side I'd be ok" (06/24/2013)   Thyroid nodule    Tobacco abuse     Past Surgical History:  Procedure Laterality Date   ABDOMINAL HYSTERECTOMY  1988   "partial" (06/24/2013)  ABDOMINAL HYSTERECTOMY     partial in [place]   "tendon repair" (06/24/2013)   BACK SURGERY     "think today was my 8th back OR" (06/24/2013)   CERVICAL SPINE SURGERY  2012   CESAREAN SECTION  1972   CHOLECYSTECTOMY N/A 04/16/2013   Procedure: LAPAROSCOPIC CHOLECYSTECTOMY ;  Surgeon: Imogene Burn. Tsuei, MD;  Location: [facility];  Service: General;  Laterality: N/A;   COLONOSCOPY     ELBOW SURGERY  1990's   FOOT SURGERY Right 2012   SPUR REMOVED   INFUSION PUMP IMPLANTATION  1990's   "implantablet morphine pump; took it out w/in 11 months   KNEE ARTHROSCOPY Left 1991; ~ [place] N/A 04/16/2013   Procedure: LAPAROSCOPIC LYSIS OF ADHESIONS;  Surgeon: Imogene Burn. Georgette Dover, MD;  Location: [facility];  Service: General;   Laterality: N/A;   LUMBAR FUSION     and rods   LUMBAR LAMINECTOMY/DECOMPRESSION MICRODISCECTOMY N/A 11/28/2012   Procedure: DECOMPRESSIVE LUMBAR LAMINECTOMY LEVEL 1;  Surgeon: Elaina Hoops, MD;  Location: [facility];  Service: Neurosurgery;  Laterality: N/A;  DECOMPRESSIVE LUMBAR LAMINECTOMY LEVEL 1   ORIF DISTAL RADIUS FRACTURE Left 12/30/2013   dr Caralyn Guile   ORIF WRIST FRACTURE Left 12/30/2013   Procedure: OPEN REDUCTION INTERNAL FIXATION (ORIF) LEFT WRIST FRACTURE AND REPAIR AS INDICATED;  Surgeon: Linna Hoff, MD;  Location: [facility];  Service: Orthopedics;  Laterality: Left;   POLYPECTOMY     POSTERIOR FUSION LUMBAR SPINE  06/24/2013   ROBOTIC ASSISTED SALPINGO OOPHERECTOMY Bilateral 08/20/2014   Procedure: ROBOTIC ASSISTED SALPINGO OOPHORECTOMY;  Surgeon: Daria Pastures, MD;  Location: [facility];  Service: Gynecology;  Laterality: Bilateral;   SHOULDER ARTHROSCOPY Left 09/2011   THYROIDECTOMY  2012   TOTAL HIP ARTHROPLASTY Left 04/27/2019   Procedure: TOTAL HIP ARTHROPLASTY ANTERIOR APPROACH;  Surgeon: Gaynelle Arabian, MD;  Location: [facility];  Service: Orthopedics;  Laterality: Left;  181min   TOTAL HIP ARTHROPLASTY Right 09/09/2019   Procedure: TOTAL HIP ARTHROPLASTY ANTERIOR APPROACH;  Surgeon: Gaynelle Arabian, MD;  Location: [facility];  Service: Orthopedics;  Laterality: Right;  162min   TOTAL KNEE ARTHROPLASTY Right 09/09/2017   Procedure: RIGHT TOTAL KNEE ARTHROPLASTY;  Surgeon: Gaynelle Arabian, MD;  Location: [facility];  Service: Orthopedics;  Laterality: Right;   UPPER GASTROINTESTINAL ENDOSCOPY     VITRECTOMY Right 10/18/2020    Family History  Problem Relation Age of Onset   Pancreatic cancer Father    Cancer Father    Diabetes type II Mother    Hypertension Mother    Coronary artery disease Mother 49   Diabetes Mother    Thyroid cancer Mother    Skin cancer Mother    Diabetes Sister    Hypertension Sister    Bone cancer Sister    Breast cancer Sister    Hypertension Brother     Breast cancer Maternal Aunt    Other Neg Hx        No family history of  colon cancer   Colon cancer Neg Hx    Colon polyps Neg Hx    Esophageal cancer Neg Hx    Stomach cancer Neg Hx    Rectal cancer Neg Hx     Social History   Socioeconomic History   Marital status: Divorced    Spouse name: Not on file   Number of children: Not on file   Years of education: Not on file  Highest education level: Not on file  Occupational History   Not on file  Tobacco Use   Smoking status: Every Day    Packs/day: 1.50    Years: 50.00    Pack years: 75.00    Types: Cigarettes   Smokeless tobacco: Never   Tobacco comments:    she has not been able to cut down   Vaping Use   Vaping Use: Never used  Substance and Sexual Activity   Alcohol use: No    Alcohol/week: 0.0 standard drinks   Drug use: No   Sexual activity: Not Currently  Other Topics Concern   Not on file  Social History Narrative   Divorced   Current Smoker  1 ppd -  20 yrs      Alcohol use-no       [employer] - laid off       Physician roster:   Dr. Elta Guadeloupe Philips - pain management   Dr. Philis Pique - GYN   Dr. Saintclair Halsted - Neurosurgery   Dr. Ronnald Ramp - dermatology   Dr. Caralyn Guile - orthopedics   Dr. Fransico Him - cardiology   Dr. Miller-ophthalmology   Social Determinants of Health   Financial Resource Strain: Low Risk    Difficulty of Paying Living Expenses: Not hard at all  Food Insecurity: No Food Insecurity   Worried About Charity fundraiser in the Last Year: Never true   Ran Out of Food in the Last Year: Never true  Transportation Needs: No Transportation Needs   Lack of Transportation (Medical): No   Lack of Transportation (Non-Medical): No  Physical Activity: Inactive   Days of Exercise per Week: 0 days   Minutes of Exercise per Session: 0 min  Stress: No Stress Concern Present   Feeling of Stress : Not at all  Social Connections: Socially Isolated   Frequency of Communication with Friends and  Family: More than three times a week   Frequency of Social Gatherings with Friends and Family: Once a week   Attends Religious Services: Never   Marine scientist or Organizations: No   Attends Music therapist: Never   Marital Status: Divorced  Human resources officer Violence: Not At Risk   Fear of Current or Ex-Partner: No   Emotionally Abused: No   Physically Abused: No   Sexually Abused: No    Outpatient Medications Prior to Visit  Medication Sig Dispense Refill   albuterol (VENTOLIN HFA) 108 (90 Base) MCG/ACT inhaler Inhale 2 puffs into the lungs every 6 (six) hours as needed for wheezing or shortness of breath. 6.7 g 2   bisacodyl (DULCOLAX) 5 MG EC tablet Take 5-10 mg by mouth daily as needed for mild constipation.  30 tablet    carisoprodol (SOMA) 350 MG tablet Take 175 mg by mouth 3 (three) times daily.      dorzolamide-timolol (COSOPT) 22.3-6.8 MG/ML ophthalmic solution Place 1 drop into the left eye 2 (two) times daily.     estradiol (ESTRACE) 0.5 MG tablet Take 0.5 mg by mouth at bedtime.   4   fluocinonide ointment (LIDEX) 0.01 % Apply 1 application topically 2 (two) times daily.     furosemide (LASIX) 40 MG tablet Take 1 tablet by mouth twice daily 180 tablet 1   hydrochlorothiazide (HYDRODIURIL) 25 MG tablet TAKE 1 TABLET BY MOUTH  DAILY 90 tablet 3   levothyroxine (SYNTHROID) 88 MCG tablet TAKE 1 TABLET BY MOUTH  DAILY BEFORE BREAKFAST 90 tablet 3  NONFORMULARY OR COMPOUNDED ITEM Compression socks  20-30 mm/ hg   Dx low ext edema 1 each 1   oxyCODONE-acetaminophen (PERCOCET) 10-325 MG tablet Take 1 tablet by mouth every 4 (four) hours as needed for pain.      potassium chloride SA (KLOR-CON) 20 MEQ tablet Take 2 tablets (40 mEq total) by mouth daily. 180 tablet 3   simvastatin (ZOCOR) 20 MG tablet TAKE 1 TABLET BY MOUTH AT  BEDTIME 90 tablet 1   traZODone (DESYREL) 50 MG tablet Take 1 tablet (50 mg total) by mouth at bedtime. 90 tablet 1   triamcinolone cream  (KENALOG) 0.1 % Apply topically.     spironolactone (ALDACTONE) 25 MG tablet Take 1/2 (one-half) tablet by mouth once daily 45 tablet 3   ondansetron (ZOFRAN) 4 MG tablet Take 1 tablet (4 mg total) by mouth as directed. 3 tablet 0   No facility-administered medications prior to visit.    Allergies  Allergen Reactions   Morphine And Related Swelling   5-Alpha Reductase Inhibitors    Amitriptyline Swelling    Leg swelling   Gabapentin Itching   Triamterene Itching and Rash    Review of Systems  Constitutional:  Negative for chills, fever and malaise/fatigue.  HENT:  Positive for congestion and sinus pain. Negative for sore throat.   Eyes:  Positive for pain (Right eye). Negative for blurred vision.  Respiratory:  Negative for shortness of breath.   Cardiovascular:  Negative for chest pain, palpitations and leg swelling.  Gastrointestinal:  Negative for abdominal pain, blood in stool and nausea.  Genitourinary:  Negative for dysuria and frequency.  Musculoskeletal:  Negative for falls.  Skin:  Negative for rash.  Neurological:  Negative for dizziness, loss of consciousness and headaches.  Endo/Heme/Allergies:  Negative for environmental allergies.  Psychiatric/Behavioral:  Negative for depression. The patient is not nervous/anxious.       Objective:    Physical Exam Vitals and nursing note reviewed.  Constitutional:      General: She is not in acute distress.    Appearance: Normal appearance. She is not ill-appearing.  HENT:     Head: Normocephalic and atraumatic.     Right Ear: External ear normal.     Left Ear: External ear normal.  Eyes:     Extraocular Movements: Extraocular movements intact.     Pupils: Pupils are equal, round, and reactive to light.  Cardiovascular:     Rate and Rhythm: Normal rate and regular rhythm.     Heart sounds: Normal heart sounds. No murmur heard.   No gallop.  Pulmonary:     Effort: Pulmonary effort is normal. No respiratory distress.      Breath sounds: Normal breath sounds. No wheezing or rales.  Skin:    General: Skin is warm and dry.  Neurological:     Mental Status: She is alert and oriented to person, place, and time.  Psychiatric:        Behavior: Behavior normal.        Judgment: Judgment normal.    BP 120/90 (BP Location: Left Arm, Patient Position: Sitting, Cuff Size: Large)   Pulse 75   Temp 97.8 F (36.6 C) (Oral)   Resp 20   Ht 5' (1.524 m)   Wt 166 lb 6.4 oz (75.5 kg)   SpO2 95%   BMI 32.50 kg/m  Wt Readings from Last 3 Encounters:  05/18/21 166 lb 6.4 oz (75.5 kg)  12/02/20 170 lb 3.2 oz (77.2 kg)  11/30/20 170 lb 12.8 oz (77.5 kg)    Diabetic Foot Exam - Simple   No data filed    Lab Results  Component Value Date   WBC 4.8 11/10/2020   HGB 13.7 11/10/2020   HCT 40.3 11/10/2020   PLT 185.0 11/10/2020   GLUCOSE 125 (H) 11/10/2020   CHOL 178 11/10/2020   TRIG 177.0 (H) 11/10/2020   HDL 64.90 11/10/2020   LDLDIRECT 106.0 11/29/2015   LDLCALC 78 11/10/2020   ALT 15 11/10/2020   AST 17 11/10/2020   NA 138 11/10/2020   K 3.2 (L) 11/10/2020   CL 94 (L) 11/10/2020   CREATININE 0.92 11/10/2020   BUN 18 11/10/2020   CO2 32 11/10/2020   TSH 0.57 11/10/2020   INR 1.0 09/03/2019   HGBA1C 6.5 11/10/2020    Lab Results  Component Value Date   TSH 0.57 11/10/2020   Lab Results  Component Value Date   WBC 4.8 11/10/2020   HGB 13.7 11/10/2020   HCT 40.3 11/10/2020   MCV 87.9 11/10/2020   PLT 185.0 11/10/2020   Lab Results  Component Value Date   NA 138 11/10/2020   K 3.2 (L) 11/10/2020   CO2 32 11/10/2020   GLUCOSE 125 (H) 11/10/2020   BUN 18 11/10/2020   CREATININE 0.92 11/10/2020   BILITOT 0.5 11/10/2020   ALKPHOS 88 11/10/2020   AST 17 11/10/2020   ALT 15 11/10/2020   PROT 6.7 11/10/2020   ALBUMIN 4.2 11/10/2020   CALCIUM 8.8 11/10/2020   ANIONGAP 6 09/11/2019   GFR 60.70 11/10/2020   Lab Results  Component Value Date   CHOL 178 11/10/2020   Lab Results   Component Value Date   HDL 64.90 11/10/2020   Lab Results  Component Value Date   LDLCALC 78 11/10/2020   Lab Results  Component Value Date   TRIG 177.0 (H) 11/10/2020   Lab Results  Component Value Date   CHOLHDL 3 11/10/2020   Lab Results  Component Value Date   HGBA1C 6.5 11/10/2020       Assessment & Plan:   Problem List Items Addressed This Visit       Unprioritized   COVID-19    Antiviral called in [place] for 5 days then can leave house with mask for 5 more days       Relevant Medications   molnupiravir EUA (LAGEVRIO) 200 mg CAPS capsule   Hyperlipidemia - Primary    Encourage heart healthy diet such as MIND or DASH diet, increase exercise, avoid trans fats, simple carbohydrates and processed foods, consider a krill or fish or flaxseed oil cap daily.       Relevant Medications   spironolactone (ALDACTONE) 25 MG tablet   Other Relevant Orders   Comprehensive metabolic panel   Lipid panel   Hypothyroidism    con't synthroid Check labs       Relevant Orders   TSH   Primary hypertension    Well controlled, no changes to meds. Encouraged heart healthy diet such as the DASH diet and exercise as tolerated.       Relevant Medications   spironolactone (ALDACTONE) 25 MG tablet   Other Relevant Orders   CBC with Differential/Platelet   Comprehensive metabolic panel   Other Visit Diagnoses     Need for influenza vaccination       Viral upper respiratory tract infection       Relevant Medications   fluticasone (FLONASE) 50 MCG/ACT nasal spray  molnupiravir EUA (LAGEVRIO) 200 mg CAPS capsule   Other Relevant Orders   POC COVID-19 (Completed)        Meds ordered this encounter  Medications   fluticasone (FLONASE) 50 MCG/ACT nasal spray    Sig: Place 2 sprays into both nostrils daily.    Dispense:  16 g    Refill:  6   spironolactone (ALDACTONE) 25 MG tablet    Sig: Take 1/2 (one-half) tablet by mouth once daily    Dispense:  45 tablet     Refill:  3   molnupiravir EUA (LAGEVRIO) 200 mg CAPS capsule    Sig: Take 4 capsules (800 mg total) by mouth 2 (two) times daily for 5 days.    Dispense:  40 capsule    Refill:  0    I, Dr. Roma Schanz, DO, personally preformed the services described in this documentation.  All medical record entries made by the scribe were at my direction and in my presence.  I have reviewed the chart and discharge instructions (if applicable) and agree that the record reflects my personal performance and is accurate and complete. 05/18/2021   I,[name],acting as a scribe for [name], DO.,have documented all relevant documentation on the behalf of [name], DO,as directed by  [name], DO while in the presence of [name], DO.   [name], DO

## 2021-05-18 NOTE — Assessment & Plan Note (Signed)
Encourage heart healthy diet such as MIND or DASH diet, increase exercise, avoid trans fats, simple carbohydrates and processed foods, consider a krill or fish or flaxseed oil cap daily.  °

## 2021-05-18 NOTE — Patient Instructions (Addendum)
Upper Respiratory Infection, Adult An upper respiratory infection (URI) is a common viral infection of the nose, throat, and upper air passages that lead to the lungs. The most common type of URI is the common cold. URIs usually get better on their own, without medical treatment. What are the causes? A URI is caused by a virus. You may catch a virus by: Breathing in droplets from an infected person's cough or sneeze. Touching something that has been exposed to the virus (is contaminated) and then touching your mouth, nose, or eyes. What increases the risk? You are more likely to get a URI if: You are very young or very old. You have close contact with others, such as at work, school, or a health care facility. You smoke. You have long-term (chronic) heart or lung disease. You have a weakened disease-fighting system (immune system). You have nasal allergies or asthma. You are experiencing a lot of stress. You have poor nutrition. What are the signs or symptoms? A URI usually involves some of the following symptoms: Runny or stuffy (congested) nose. Cough. Sneezing. Sore throat. Headache. Fatigue. Fever. Loss of appetite. Pain in your forehead, behind your eyes, and over your cheekbones (sinus pain). Muscle aches. Redness or irritation of the eyes. Pressure in the ears or face. How is this diagnosed? This condition may be diagnosed based on your medical history and symptoms, and a physical exam. Your health care provider may use a swab to take a mucus sample from your nose (nasal swab). This sample can be tested to determine what virus is causing the illness. How is this treated? URIs usually get better on their own within 7-10 days. Medicines cannot cure URIs, but your health care provider may recommend certain medicines to help relieve symptoms, such as: Over-the-counter cold medicines. Cough suppressants. Coughing is a type of defense against infection that helps to clear the  respiratory system, so take these medicines only as recommended by your health care provider. Fever-reducing medicines. Follow these instructions at home: Activity Rest as needed. If you have a fever, stay home from work or school until your fever is gone or until your health care provider says your URI cannot spread to other people (is no longer contagious). Your health care provider may have you wear a face mask to prevent your infection from spreading. Relieving symptoms Gargle with a mixture of salt and water 3-4 times a day or as needed. To make salt water, completely dissolve -1 tsp (3-6 g) of salt in 1 cup (237 mL) of warm water. Use a cool-mist humidifier to add moisture to the air. This can help you breathe more easily. Eating and drinking  Drink enough fluid to keep your urine pale yellow. Eat soups and other clear broths. General instructions  Take over-the-counter and prescription medicines only as told by your health care provider. These include cold medicines, fever reducers, and cough suppressants. Do not use any products that contain nicotine or tobacco. These products include cigarettes, chewing tobacco, and vaping devices, such as e-cigarettes. If you need help quitting, ask your health care provider. Stay away from secondhand smoke. Stay up to date on all immunizations, including the yearly (annual) flu vaccine. Keep all follow-up visits. This is important. How to prevent the spread of infection to others URIs can be contagious. To prevent the infection from spreading: Wash your hands with soap and water for at least 20 seconds. If soap and water are not available, use hand sanitizer. Avoid touching your mouth,   face, eyes, or nose. Cough or sneeze into a tissue or your sleeve or elbow instead of into your hand or into the air.  Contact a health care provider if: You are getting worse instead of better. You have a fever or chills. Your mucus is brown or red. You have  yellow or brown discharge coming from your nose. You have pain in your face, especially when you bend forward. You have swollen neck glands. You have pain while swallowing. You have white areas in the back of your throat. Get help right away if: You have shortness of breath that gets worse. You have severe or persistent: Headache. Ear pain. Sinus pain. Chest pain. You have chronic lung disease along with any of the following: Making high-pitched whistling sounds when you breathe, most often when you breathe out (wheezing). Prolonged cough (more than 14 days). Coughing up blood. A change in your usual mucus. You have a stiff neck. You have changes in your: Vision. Hearing. Thinking. Mood. These symptoms may be an emergency. Get help right away. Call 911. Do not wait to see if the symptoms will go away. Do not drive yourself to the hospital. Summary An upper respiratory infection (URI) is a common infection of the nose, throat, and upper air passages that lead to the lungs. A URI is caused by a virus. URIs usually get better on their own within 7-10 days. Medicines cannot cure URIs, but your health care provider may recommend certain medicines to help relieve symptoms. This information is not intended to replace advice given to you by your health care provider. Make sure you discuss any questions you have with your health care provider. Document Revised: 01/25/2021 Document Reviewed: 01/25/2021 Elsevier Patient Education  Joan [name] and Audiological scientist If you were exposed Quarantine and stay away from others when you have been in close contact with someone who has COVID-19. Isolate If you are sick or test positive Isolate when you are sick or when you have COVID-19, even if you don't have symptoms. When to stay home Calculating quarantine The date of your exposure is considered day 0. Day 1 is the first full day after your last contact with  a person who has had COVID-19. Stay home and away from other people for at least 5 days. Learn why CDC updated guidance for the general public. IF YOU were exposed to COVID-19 and are NOT  up to dateIF YOU were exposed to COVID-19 and are NOT on COVID-19 vaccinations Quarantine for at least 5 days Stay home Stay home and quarantine for at least 5 full days. Wear a well-fitting mask if you must be around others in your home. Do not travel. Get tested Even if you don't develop symptoms, get tested at least 5 days after you last had close contact with someone with COVID-19. After quarantine Watch for symptoms Watch for symptoms until 10 days after you last had close contact with someone with COVID-19. Avoid travel It is best to avoid travel until a full 10 days after you last had close contact with someone with COVID-19. If you develop symptoms Isolate immediately and get tested. Continue to stay home until you know the results. Wear a well-fitting mask around others. Take precautions until day 10 Wear a well-fitting mask Wear a well-fitting mask for 10 full days any time you are around others inside your home or in public. Do not go to places where you are unable to wear a well-fitting mask. If  you must travel during days 6-10, take precautions. Avoid being around people who are more likely to get very sick from COVID-19. IF YOU were exposed to COVID-19 and are  up to dateIF YOU were exposed to COVID-19 and are on COVID-19 vaccinations No quarantine You do not need to stay home unless you develop symptoms. Get tested Even if you don't develop symptoms, get tested at least 5 days after you last had close contact with someone with COVID-19. Watch for symptoms Watch for symptoms until 10 days after you last had close contact with someone with COVID-19. If you develop symptoms Isolate immediately and get tested. Continue to stay home until you know the results. Wear a well-fitting mask  around others. Take precautions until day 10 Wear a well-fitting mask Wear a well-fitting mask for 10 full days any time you are around others inside your home or in public. Do not go to places where you are unable to wear a well-fitting mask. Take precautions if traveling Avoid being around people who are more likely to get very sick from COVID-19. IF YOU were exposed to COVID-19 and had confirmed COVID-19 within the past 90 days (you tested positive using a viral test) No quarantine You do not need to stay home unless you develop symptoms. Watch for symptoms Watch for symptoms until 10 days after you last had close contact with someone with COVID-19. If you develop symptoms Isolate immediately and get tested. Continue to stay home until you know the results. Wear a well-fitting mask around others. Take precautions until day 10 Wear a well-fitting mask Wear a well-fitting mask for 10 full days any time you are around others inside your home or in public. Do not go to places where you are unable to wear a well-fitting mask. Take precautions if traveling Avoid being around people who are more likely to get very sick from COVID-19. Calculating isolation Day 0 is your first day of symptoms or a positive viral test. Day 1 is the first full day after your symptoms developed or your test specimen was collected. If you have COVID-19 or have symptoms, isolate for at least 5 days. IF YOU tested positive for COVID-19 or have symptoms, regardless of vaccination status Stay home for at least 5 days Stay home for 5 days and isolate from others in your home. Wear a well-fitting mask if you must be around others in your home. Do not travel. Ending isolation if you had symptoms End isolation after 5 full days if you are fever-free for 24 hours (without the use of fever-reducing medication) and your symptoms are improving. Ending isolation if you did NOT have symptoms End isolation after at least 5 full  days after your positive test. If you got very sick from COVID-19 or have a weakened immune system You should isolate for at least 10 days. Consult your doctor before ending isolation. Take precautions until day 10 Wear a well-fitting mask Wear a well-fitting mask for 10 full days any time you are around others inside your home or in public. Do not go to places where you are unable to wear a well-fitting mask. Do not travel Do not travel until a full 10 days after your symptoms started or the date your positive test was taken if you had no symptoms. Avoid being around people who are more likely to get very sick from COVID-19. Definitions Exposure Contact with someone infected with SARS-CoV-2, the virus that causes COVID-19, in a way that increases the  likelihood of getting infected with the virus. Close contact A close contact is someone who was less than 6 feet away from an infected person (laboratory-confirmed or a clinical diagnosis) for a cumulative total of 15 minutes or more over a 24-hour period. For example, three individual 5-minute exposures for a total of 15 minutes. People who are exposed to someone with COVID-19 after they completed at least 5 days of isolation are not considered close contacts. Julio Sicks is a strategy used to prevent transmission of COVID-19 by keeping people who have been in close contact with someone with COVID-19 apart from others. Who does not need to quarantine? If you had close contact with someone with COVID-19 and you are in one of the following groups, you do not need to quarantine. You are up to date with your COVID-19 vaccines. You had confirmed COVID-19 within the last 90 days (meaning you tested positive using a viral test). If you are up to date with COVID-19 vaccines, you should wear a well-fitting mask around others for 10 days from the date of your last close contact with someone with COVID-19 (the date of last close contact is  considered day 0). Get tested at least 5 days after you last had close contact with someone with COVID-19. If you test positive or develop COVID-19 symptoms, isolate from other people and follow recommendations in the Isolation section below. If you tested positive for COVID-19 with a viral test within the previous 90 days and subsequently recovered and remain without COVID-19 symptoms, you do not need to quarantine or get tested after close contact. You should wear a well-fitting mask around others for 10 days from the date of your last close contact with someone with COVID-19 (the date of last close contact is considered day 0). If you have COVID-19 symptoms, get tested and isolate from other people and follow recommendations in the Isolation section below. Who should quarantine? If you come into close contact with someone with COVID-19, you should quarantine if you are not up to date on COVID-19 vaccines. This includes people who are not vaccinated. What to do for quarantine Stay home and away from other people for at least 5 days (day 0 through day 5) after your last contact with a person who has COVID-19. The date of your exposure is considered day 0. Wear a well-fitting mask when around others at home, if possible. For 10 days after your last close contact with someone with COVID-19, watch for fever (100.59F or greater), cough, shortness of breath, or other COVID-19 symptoms. If you develop symptoms, get tested immediately and isolate until you receive your test results. If you test positive, follow isolation recommendations. If you do not develop symptoms, get tested at least 5 days after you last had close contact with someone with COVID-19. If you test negative, you can leave your home, but continue to wear a well-fitting mask when around others at home and in public until 10 days after your last close contact with someone with COVID-19. If you test positive, you should isolate for at least 5 days  from the date of your positive test (if you do not have symptoms). If you do develop COVID-19 symptoms, isolate for at least 5 days from the date your symptoms began (the date the symptoms started is day 0). Follow recommendations in the isolation section below. If you are unable to get a test 5 days after last close contact with someone with COVID-19, you can leave your home  after day 5 if you have been without COVID-19 symptoms throughout the 5-day period. Wear a well-fitting mask for 10 days after your date of last close contact when around others at home and in public. Avoid people who are have weakened immune systems or are more likely to get very sick from COVID-19, and nursing homes and other high-risk settings, until after at least 10 days. If possible, stay away from people you live with, especially people who are at higher risk for getting very sick from COVID-19, as well as others outside your home throughout the full 10 days after your last close contact with someone with COVID-19. If you are unable to quarantine, you should wear a well-fitting mask for 10 days when around others at home and in public. If you are unable to wear a mask when around others, you should continue to quarantine for 10 days. Avoid people who have weakened immune systems or are more likely to get very sick from COVID-19, and nursing homes and other high-risk settings, until after at least 10 days. See additional information about travel. Do not go to places where you are unable to wear a mask, such as restaurants and some gyms, and avoid eating around others at home and at work until after 10 days after your last close contact with someone with COVID-19. After quarantine Watch for symptoms until 10 days after your last close contact with someone with COVID-19. If you have symptoms, isolate immediately and get tested. Quarantine in high-risk congregate settings In certain congregate settings that have high risk of  secondary transmission (such as Systems analyst and detention facilities, homeless shelters, or cruise ships), CDC recommends a 10-day quarantine for residents, regardless of vaccination and booster status. During periods of critical staffing shortages, facilities may consider shortening the quarantine period for staff to ensure continuity of operations. Decisions to shorten quarantine in these settings should be made in consultation with state, local, tribal, or territorial health departments and should take into consideration the context and characteristics of the facility. CDC's setting-specific guidance provides additional recommendations for these settings. Isolation Isolation is used to separate people with confirmed or suspected COVID-19 from those without COVID-19. People who are in isolation should stay home until it's safe for them to be around others. At home, anyone sick or infected should separate from others, or wear a well-fitting mask when they need to be around others. People in isolation should stay in a specific "sick room" or area and use a separate bathroom if available. Everyone who has presumed or confirmed COVID-19 should stay home and isolate from other people for at least 5 full days (day 0 is the first day of symptoms or the date of the day of the positive viral test for asymptomatic persons). They should wear a mask when around others at home and in public for an additional 5 days. People who are confirmed to have COVID-19 or are showing symptoms of COVID-19 need to isolate regardless of their vaccination status. This includes: People who have a positive viral test for COVID-19, regardless of whether or not they have symptoms. People with symptoms of COVID-19, including people who are awaiting test results or have not been tested. People with symptoms should isolate even if they do not know if they have been in close contact with someone with COVID-19. What to do for isolation Monitor  your symptoms. If you have an emergency warning sign (including trouble breathing), seek emergency medical care immediately. Stay in a separate room  from other household members, if possible. Use a separate bathroom, if possible. Take steps to improve ventilation at home, if possible. Avoid contact with other members of the household and pets. Don't share personal household items, like cups, towels, and utensils. Wear a well-fitting mask when you need to be around other people. Learn more about what to do if you are sick and how to notify your contacts. Ending isolation for people who had COVID-19 and had symptoms If you had COVID-19 and had symptoms, isolate for at least 5 days. To calculate your 5-day isolation period, day 0 is your first day of symptoms. Day 1 is the first full day after your symptoms developed. You can leave isolation after 5 full days. You can end isolation after 5 full days if you are fever-free for 24 hours without the use of fever-reducing medication and your other symptoms have improved (Loss of taste and smell may persist for weeks or months after recovery and need not delay the end of isolation). You should continue to wear a well-fitting mask around others at home and in public for 5 additional days (day 6 through day 10) after the end of your 5-day isolation period. If you are unable to wear a mask when around others, you should continue to isolate for a full 10 days. Avoid people who have weakened immune systems or are more likely to get very sick from COVID-19, and nursing homes and other high-risk settings, until after at least 10 days. If you continue to have fever or your other symptoms have not improved after 5 days of isolation, you should wait to end your isolation until you are fever-free for 24 hours without the use of fever-reducing medication and your other symptoms have improved. Continue to wear a well-fitting mask through day 10. Contact your healthcare  provider if you have questions. See additional information about travel. Do not go to places where you are unable to wear a mask, such as restaurants and some gyms, and avoid eating around others at home and at work until a full 10 days after your first day of symptoms. If an individual has access to a test and wants to test, the best approach is to use an antigen test1 towards the end of the 5-day isolation period. Collect the test sample only if you are fever-free for 24 hours without the use of fever-reducing medication and your other symptoms have improved (loss of taste and smell may persist for weeks or months after recovery and need not delay the end of isolation). If your test result is positive, you should continue to isolate until day 10. If your test result is negative, you can end isolation, but continue to wear a well-fitting mask around others at home and in public until day 10. Follow additional recommendations for masking and avoiding travel as described above. 1As noted in the labeling for authorized over-the counter antigen tests: Negative results should be treated as presumptive. Negative results do not rule out SARS-CoV-2 infection and should not be used as the sole basis for treatment or patient management decisions, including infection control decisions. To improve results, antigen tests should be used twice over a three-day period with at least 24 hours and no more than 48 hours between tests. Note that these recommendations on ending isolation do not apply to people who are moderately ill or very sick from COVID-19 or have weakened immune systems. See section below for recommendations for when to end isolation for these groups. Ending  isolation for people who tested positive for COVID-19 but had no symptoms If you test positive for COVID-19 and never develop symptoms, isolate for at least 5 days. Day 0 is the day of your positive viral test (based on the date you were tested) and day 1  is the first full day after the specimen was collected for your positive test. You can leave isolation after 5 full days. If you continue to have no symptoms, you can end isolation after at least 5 days. You should continue to wear a well-fitting mask around others at home and in public until day 10 (day 6 through day 10). If you are unable to wear a mask when around others, you should continue to isolate for 10 days. Avoid people who have weakened immune systems or are more likely to get very sick from COVID-19, and nursing homes and other high-risk settings, until after at least 10 days. If you develop symptoms after testing positive, your 5-day isolation period should start over. Day 0 is your first day of symptoms. Follow the recommendations above for ending isolation for people who had COVID-19 and had symptoms. See additional information about travel. Do not go to places where you are unable to wear a mask, such as restaurants and some gyms, and avoid eating around others at home and at work until 10 days after the day of your positive test. If an individual has access to a test and wants to test, the best approach is to use an antigen test1 towards the end of the 5-day isolation period. If your test result is positive, you should continue to isolate until day 10. If your test result is positive, you can also choose to test daily and if your test result is negative, you can end isolation, but continue to wear a well-fitting mask around others at home and in public until day 10. Follow additional recommendations for masking and avoiding travel as described above. 1As noted in the labeling for authorized over-the counter antigen tests: Negative results should be treated as presumptive. Negative results do not rule out SARS-CoV-2 infection and should not be used as the sole basis for treatment or patient management decisions, including infection control decisions. To improve results, antigen tests should  be used twice over a three-day period with at least 24 hours and no more than 48 hours between tests. Ending isolation for people who were moderately or very sick from COVID-19 or have a weakened immune system People who are moderately ill from COVID-19 (experiencing symptoms that affect the lungs like shortness of breath or difficulty breathing) should isolate for 10 days and follow all other isolation precautions. To calculate your 10-day isolation period, day 0 is your first day of symptoms. Day 1 is the first full day after your symptoms developed. If you are unsure if your symptoms are moderate, talk to a healthcare provider for further guidance. People who are very sick from COVID-19 (this means people who were hospitalized or required intensive care or ventilation support) and people who have weakened immune systems might need to isolate at home longer. They may also require testing with a viral test to determine when they can be around others. CDC recommends an isolation period of at least 10 and up to 20 days for people who were very sick from COVID-19 and for people with weakened immune systems. Consult with your healthcare provider about when you can resume being around other people. If you are unsure if your symptoms are  severe or if you have a weakened immune system, talk to a healthcare provider for further guidance. People who have a weakened immune system should talk to their healthcare provider about the potential for reduced immune responses to COVID-19 vaccines and the need to continue to follow current prevention measures (including wearing a well-fitting mask and avoiding crowds and poorly ventilated indoor spaces) to protect themselves against COVID-19 until advised otherwise by their healthcare provider. Close contacts of immunocompromised people--including household members--should also be encouraged to receive all recommended COVID-19 vaccine doses to help protect these  people. Isolation in high-risk congregate settings In certain high-risk congregate settings that have high risk of secondary transmission and where it is not feasible to cohort people (such as Systems analyst and detention facilities, homeless shelters, and cruise ships), CDC recommends a 10-day isolation period for residents. During periods of critical staffing shortages, facilities may consider shortening the isolation period for staff to ensure continuity of operations. Decisions to shorten isolation in these settings should be made in consultation with state, local, tribal, or territorial health departments and should take into consideration the context and characteristics of the facility. CDC's setting-specific guidance provides additional recommendations for these settings. This CDC guidance is meant to supplement--not replace--any federal, state, local, territorial, or tribal health and safety laws, rules, and regulations. Recommendations for specific settings These recommendations do not apply to healthcare professionals. For guidance specific to these settings, see Healthcare professionals: Interim Guidance for Optician, dispensing with SARS-CoV-2 Infection or Exposure to SARS-CoV-2 Patients, residents, and visitors to healthcare settings: Interim Infection Prevention and Control Recommendations for Healthcare Personnel During the [place] 2019 (COVID-19) Pandemic Additional setting-specific guidance and recommendations are available. These recommendations on quarantine and isolation do apply to [facility] settings. Additional guidance is available here: Overview of COVID-19 Quarantine for K-12 Schools Travelers: Travel information and recommendations Congregate facilities and other settings: [employer] for community, work, and school settings Ongoing COVID-19 exposure FAQs I live with someone with COVID-19, but I cannot be separated from them. How do we manage quarantine  in this situation? It is very important for people with COVID-19 to remain apart from other people, if possible, even if they are living together. If separation of the person with COVID-19 from others that they live with is not possible, the other people that they live with will have ongoing exposure, meaning they will be repeatedly exposed until that person is no longer able to spread the virus to other people. In this situation, there are precautions you can take to limit the spread of COVID-19: The person with COVID-19 and everyone they live with should wear a well-fitting mask inside the home. If possible, one person should care for the person with COVID-19 to limit the number of people who are in close contact with the infected person. Take steps to protect yourself and others to reduce transmission in the home: Quarantine if you are not up to date with your COVID-19 vaccines. Isolate if you are sick or tested positive for COVID-19, even if you don't have symptoms. Learn more about the public health recommendations for testing, mask use and quarantine of close contacts, like yourself, who have ongoing exposure. These recommendations differ depending on your vaccination status. What should I do if I have ongoing exposure to COVID-19 from someone I live with? Recommendations for this situation depend on your vaccination status: If you are not up to date on COVID-19 vaccines and have ongoing exposure to COVID-19, you should: Begin quarantine  immediately and continue to quarantine throughout the isolation period of the person with COVID-19. Continue to quarantine for an additional 5 days starting the day after the end of isolation for the person with COVID-19. Get tested at least 5 days after the end of isolation of the infected person that lives with them. If you test negative, you can leave the home but should continue to wear a well-fitting mask when around others at home and in public until 10  days after the end of isolation for the person with COVID-19. Isolate immediately if you develop symptoms of COVID-19 or test positive. If you are up to date with COVID-19 vaccines and have ongoing exposure to COVID-19, you should: Get tested at least 5 days after your first exposure. A person with COVID-19 is considered infectious starting 2 days before they develop symptoms, or 2 days before the date of their positive test if they do not have symptoms. Get tested again at least 5 days after the end of isolation for the person with COVID-19. Wear a well-fitting mask when you are around the person with COVID-19, and do this throughout their isolation period. Wear a well-fitting mask around others for 10 days after the infected person's isolation period ends. Isolate immediately if you develop symptoms of COVID-19 or test positive. What should I do if multiple people I live with test positive for COVID-19 at different times? Recommendations for this situation depend on your vaccination status: If you are not up to date with your COVID-19 vaccines, you should: Quarantine throughout the isolation period of any infected person that you live with. Continue to quarantine until 5 days after the end of isolation date for the most recently infected person that lives with you. For example, if the last day of isolation of the person most recently infected with COVID-19 was June 30, the new 5-day quarantine period starts on July 1. Get tested at least 5 days after the end of isolation for the most recently infected person that lives with you. Wear a well-fitting mask when you are around any person with COVID-19 while that person is in isolation. Wear a well-fitting mask when you are around other people until 10 days after your last close contact. Isolate immediately if you develop symptoms of COVID-19 or test positive. If you are up to date with your COVID-19 vaccines, you should: Get tested at least 5 days  after your first exposure. A person with COVID-19 is considered infectious starting 2 days before they developed symptoms, or 2 days before the date of their positive test if they do not have symptoms. Get tested again at least 5 days after the end of isolation for the most recently infected person that lives with you. Wear a well-fitting mask when you are around any person with COVID-19 while that person is in isolation. Wear a well-fitting mask around others for 10 days after the end of isolation for the most recently infected person that lives with you. For example, if the last day of isolation for the person most recently infected with COVID-19 was June 30, the new 10-day period to wear a well-fitting mask indoors in public starts on July 1. Isolate immediately if you develop symptoms of COVID-19 or test positive. I had COVID-19 and completed isolation. Do I have to quarantine or get tested if someone I live with gets COVID-19 shortly after I completed isolation? No. If you recently completed isolation and someone that lives with you tests positive for the virus  that causes COVID-19 shortly after the end of your isolation period, you do not have to quarantine or get tested as long as you do not develop new symptoms. Once all of the people that live together have completed isolation or quarantine, refer to the guidance below for new exposures to COVID-19. If you had COVID-19 in the previous 90 days and then came into close contact with someone with COVID-19, you do not have to quarantine or get tested if you do not have symptoms. But you should: Wear a well-fitting mask indoors in public for 10 days after your last close contact. Monitor for COVID-19 symptoms for 10 days from the date of your last close contact. Isolate immediately and get tested if symptoms develop. If more than 90 days have passed since your recovery from infection, follow CDC's recommendations for close contacts. These  recommendations will differ depending on your vaccination status. 10/05/2020 Content source: [facility] ([facility]), Division of Viral Diseases This information is not intended to replace advice given to you by your health care provider. Make sure you discuss any questions you have with your health care provider. Document Revised: 02/08/2021 Document Reviewed: 02/08/2021 Elsevier Patient Education  [place].

## 2021-05-18 NOTE — Assessment & Plan Note (Signed)
Antiviral called in Quarantine for 5 days then can leave house with mask for 5 more days

## 2021-05-18 NOTE — Assessment & Plan Note (Signed)
Well controlled, no changes to meds. Encouraged heart healthy diet such as the DASH diet and exercise as tolerated.  °

## 2021-05-18 NOTE — Assessment & Plan Note (Signed)
con't synthroid Check labs  

## 2021-05-25 DIAGNOSIS — G894 Chronic pain syndrome: Secondary | ICD-10-CM | POA: Diagnosis not present

## 2021-05-25 DIAGNOSIS — M961 Postlaminectomy syndrome, not elsewhere classified: Secondary | ICD-10-CM | POA: Diagnosis not present

## 2021-05-25 DIAGNOSIS — Z79891 Long term (current) use of opiate analgesic: Secondary | ICD-10-CM | POA: Diagnosis not present

## 2021-05-25 DIAGNOSIS — M62830 Muscle spasm of back: Secondary | ICD-10-CM | POA: Diagnosis not present

## 2021-06-14 ENCOUNTER — Other Ambulatory Visit: Payer: Self-pay | Admitting: Family Medicine

## 2021-06-14 DIAGNOSIS — E785 Hyperlipidemia, unspecified: Secondary | ICD-10-CM

## 2021-06-21 ENCOUNTER — Ambulatory Visit: Payer: Medicare Other | Admitting: Internal Medicine

## 2021-06-25 ENCOUNTER — Other Ambulatory Visit: Payer: Self-pay | Admitting: Family Medicine

## 2021-07-18 DIAGNOSIS — L308 Other specified dermatitis: Secondary | ICD-10-CM | POA: Diagnosis not present

## 2021-07-20 DIAGNOSIS — M961 Postlaminectomy syndrome, not elsewhere classified: Secondary | ICD-10-CM | POA: Diagnosis not present

## 2021-07-20 DIAGNOSIS — G894 Chronic pain syndrome: Secondary | ICD-10-CM | POA: Diagnosis not present

## 2021-07-20 DIAGNOSIS — Z79891 Long term (current) use of opiate analgesic: Secondary | ICD-10-CM | POA: Diagnosis not present

## 2021-07-20 DIAGNOSIS — M62830 Muscle spasm of back: Secondary | ICD-10-CM | POA: Diagnosis not present

## 2021-07-28 ENCOUNTER — Ambulatory Visit: Payer: Medicare Other | Admitting: Internal Medicine

## 2021-08-07 DIAGNOSIS — H16121 Filamentary keratitis, right eye: Secondary | ICD-10-CM | POA: Diagnosis not present

## 2021-08-22 DIAGNOSIS — S0500XD Injury of conjunctiva and corneal abrasion without foreign body, unspecified eye, subsequent encounter: Secondary | ICD-10-CM | POA: Diagnosis not present

## 2021-08-22 DIAGNOSIS — H16129 Filamentary keratitis, unspecified eye: Secondary | ICD-10-CM | POA: Diagnosis not present

## 2021-09-14 DIAGNOSIS — G894 Chronic pain syndrome: Secondary | ICD-10-CM | POA: Diagnosis not present

## 2021-09-14 DIAGNOSIS — Z79891 Long term (current) use of opiate analgesic: Secondary | ICD-10-CM | POA: Diagnosis not present

## 2021-09-14 DIAGNOSIS — M961 Postlaminectomy syndrome, not elsewhere classified: Secondary | ICD-10-CM | POA: Diagnosis not present

## 2021-09-14 DIAGNOSIS — M62830 Muscle spasm of back: Secondary | ICD-10-CM | POA: Diagnosis not present

## 2021-10-04 ENCOUNTER — Ambulatory Visit: Payer: Medicare Other | Admitting: Internal Medicine

## 2021-10-04 ENCOUNTER — Other Ambulatory Visit: Payer: Self-pay

## 2021-10-04 ENCOUNTER — Encounter: Payer: Self-pay | Admitting: Internal Medicine

## 2021-10-04 VITALS — BP 120/72 | HR 76 | Ht 60.0 in | Wt 165.0 lb

## 2021-10-04 DIAGNOSIS — E890 Postprocedural hypothyroidism: Secondary | ICD-10-CM

## 2021-10-04 DIAGNOSIS — H00014 Hordeolum externum left upper eyelid: Secondary | ICD-10-CM | POA: Diagnosis not present

## 2021-10-04 DIAGNOSIS — I872 Venous insufficiency (chronic) (peripheral): Secondary | ICD-10-CM

## 2021-10-04 LAB — TSH: TSH: 0.44 u[IU]/mL (ref 0.35–5.50)

## 2021-10-04 MED ORDER — AMOXICILLIN-POT CLAVULANATE 875-125 MG PO TABS: 1.0000 | ORAL_TABLET | Freq: Two times a day (BID) | ORAL | 0 refills | Status: DC

## 2021-10-04 NOTE — Progress Notes (Signed)
? ?Name: [name]  ?MRN/ DOB: [id number], [birth year]    ?Age/ Sex: 76 y.o., female   ? ? ?PCP: [name], DO   ?Reason for Endocrinology Evaluation: Hypothyroidism  ?   ?Initial Endocrinology Clinic Visit: 08/27/2019  ? ? ?PATIENT IDENTIFIER: Joan [name] is a 76 y.o., female with a past medical history of HTN and hypothyroidism. She has followed with [facility] since 08/27/2019 for consultative assistance with management of her Hypothyroidism  ? ?HISTORICAL SUMMARY:   ? ?She has been diagnosed with left thyroid nodule in 2010 . She is S/P FNA on the left nodule in 2012 with atypia. She is s/p total thyroidectomy with benign pathology and evidence of hashimoto's thyroiditis.  ?  ?Has been on LT-4 replacement since 1982 , her dose has been gradually reduced ?SUBJECTIVE:  ? ? ?Today (10/04/2021):  Ms. Zwick is here for a follow up on post-operative hypothyroidism.  ? ? ?Weight has been trending down  ?She had a right eye sx  ?She has COVID in 05/2021 with lasting effects with GI issues and GERD on Prilosec  ? ?This morning noted a left eye stye with mild soreness  ?She does not sleep well ar night due to back issues  ?Denies local neck swelling  ?She has LE edema , has been elevating them, compression stocks are difficult to apply, left shin has been oozing  ? ?She is on Levothyroxine 88 mcg , 1.5 tabs on Sundays and 1 tab rest of the week  ? ? ? ?HISTORY:  ?Past Medical History:  ?Past Medical History:  ?Diagnosis Date  ? Arthritis   ? "shoulders; wrist; probably spine" (06/24/2013)  ? Blood transfusion without reported diagnosis   ? Cancer [facility])   ? skin cancer right leg x 4    ? Cataract   ? removed both eyes   ? Chronic low back pain   ? followed by Dr Hardin Negus pain mgt  ? Colon polyp   ? Constipation   ? COPD (chronic obstructive pulmonary disease) ([place])   ? mild  ? Esophageal stricture   ? Finger pain, left   ? 2 fingers on left hand since wrist surgery  ? GERD  (gastroesophageal reflux disease)   ? Glaucoma   ? Heart murmur   ? "slight; not on RX" (06/24/2013)  ? History of cardiac arrhythmia   ? cardiologist- Traci Turner  ? Hx of colonic polyps 08/28/2004  ? Hyperlipemia   ? Hypertension   ? Hypothyroidism   ? Osteoarthritis of left knee   ? advanced  ? PONV (postoperative nausea and vomiting)   ? Pt reports symptoms are the result of gallbladder and cholecystectomy, not anesthesia  ? PVC's (premature ventricular contractions)   ? Sleep apnea 1990's  ? "tested; tried mask; couldn't stand it; told me as long as I slept on my side I'd be ok" (06/24/2013)  ? Thyroid nodule   ? Tobacco abuse   ? ?Past Surgical History:  ?Past Surgical History:  ?Procedure Laterality Date  ? ABDOMINAL HYSTERECTOMY  1988  ? "partial" (06/24/2013)  ? ABDOMINAL HYSTERECTOMY    ? partial in 1988  ? ANKLE SURGERY Left 1995  ? "tendon repair" (06/24/2013)  ? BACK SURGERY    ? "think today was my 8th back OR" (06/24/2013)  ? CERVICAL SPINE SURGERY  2012  ? [place]  ? CHOLECYSTECTOMY N/A 04/16/2013  ? Procedure: LAPAROSCOPIC CHOLECYSTECTOMY ;  Surgeon: Imogene Burn.  Georgette Dover, MD;  Location: [facility];  Service: General;  Laterality: N/A;  ? COLONOSCOPY    ? ELBOW SURGERY  1990's  ? FOOT SURGERY Right 2012  ? SPUR REMOVED  ? INFUSION PUMP IMPLANTATION  1990's  ? "implantablet morphine pump; took it out w/in 11 months  ? KNEE ARTHROSCOPY Left 1991; ~ 1993  ? LAPAROSCOPIC LYSIS OF ADHESIONS N/A 04/16/2013  ? Procedure: LAPAROSCOPIC LYSIS OF ADHESIONS;  Surgeon: Imogene Burn. Georgette Dover, MD;  Location: [facility];  Service: General;  Laterality: N/A;  ? LUMBAR FUSION    ? and rods  ? LUMBAR LAMINECTOMY/DECOMPRESSION MICRODISCECTOMY N/A 11/28/2012  ? Procedure: DECOMPRESSIVE LUMBAR LAMINECTOMY LEVEL 1;  Surgeon: Elaina Hoops, MD;  Location: [facility];  Service: Neurosurgery;  Laterality: N/A;  DECOMPRESSIVE LUMBAR LAMINECTOMY LEVEL 1  ? ORIF DISTAL RADIUS FRACTURE Left 12/30/2013  ? dr Caralyn Guile  ? ORIF WRIST  FRACTURE Left 12/30/2013  ? Procedure: OPEN REDUCTION INTERNAL FIXATION (ORIF) LEFT WRIST FRACTURE AND REPAIR AS INDICATED;  Surgeon: Linna Hoff, MD;  Location: [facility];  Service: Orthopedics;  Laterality: Left;  ? POLYPECTOMY    ? POSTERIOR FUSION LUMBAR SPINE  06/24/2013  ? ROBOTIC ASSISTED SALPINGO OOPHERECTOMY Bilateral 08/20/2014  ? Procedure: ROBOTIC ASSISTED SALPINGO OOPHORECTOMY;  Surgeon: Daria Pastures, MD;  Location: [facility];  Service: Gynecology;  Laterality: Bilateral;  ? SHOULDER ARTHROSCOPY Left 09/2011  ? THYROIDECTOMY  2012  ? TOTAL HIP ARTHROPLASTY Left 04/27/2019  ? Procedure: TOTAL HIP ARTHROPLASTY ANTERIOR APPROACH;  Surgeon: Gaynelle Arabian, MD;  Location: [facility];  Service: Orthopedics;  Laterality: Left;  162mn  ? TOTAL HIP ARTHROPLASTY Right 09/09/2019  ? Procedure: TOTAL HIP ARTHROPLASTY ANTERIOR APPROACH;  Surgeon: AGaynelle Arabian MD;  Location: [facility];  Service: Orthopedics;  Laterality: Right;  1037m  ? TOTAL KNEE ARTHROPLASTY Right 09/09/2017  ? Procedure: RIGHT TOTAL KNEE ARTHROPLASTY;  Surgeon: AlGaynelle ArabianMD;  Location: [facility];  Service: Orthopedics;  Laterality: Right;  ? UPPER GASTROINTESTINAL ENDOSCOPY    ? VITRECTOMY Right 10/18/2020  ? ?Social History:  reports that she has been smoking cigarettes. She has a 75.00 pack-year smoking history. She has never used smokeless tobacco. She reports that she does not drink alcohol and does not use drugs. ?Family History:  ?Family History  ?Problem Relation Age of Onset  ? Pancreatic cancer Father   ? Cancer Father   ? Diabetes type II Mother   ? Hypertension Mother   ? Coronary artery disease Mother [id number]? Diabetes Mother   ? Thyroid cancer Mother   ? Skin cancer Mother   ? Diabetes Sister   ? Hypertension Sister   ? Bone cancer Sister   ? Breast cancer Sister   ? Hypertension Brother   ? Breast cancer Maternal Aunt   ? Other Neg Hx   ?     No family history of  colon cancer  ? Colon cancer Neg Hx   ? Colon polyps Neg Hx   ? Esophageal  cancer Neg Hx   ? Stomach cancer Neg Hx   ? Rectal cancer Neg Hx   ? ? ? ?HOME MEDICATIONS: ?Allergies as of 10/04/2021   ? ?   Reactions  ? Morphine And Related Swelling  ? 5-alpha Reductase Inhibitors   ? Amitriptyline Swelling  ? Leg swelling  ? Gabapentin Itching  ? Triamterene Itching, Rash  ? ?  ? ?  ?Medication List  ?  ? ?  ? Accurate as of October 04, 2021  1:19 PM. If you have any questions, ask your nurse or doctor.  ?  ?  ? ?  ? ?albuterol 108 (90 Base) MCG/ACT inhaler ?Commonly known as: VENTOLIN HFA ?Inhale 2 puffs into the lungs every 6 (six) hours as needed for wheezing or shortness of breath. ?  ?bisacodyl 5 MG EC tablet ?Commonly known as: DULCOLAX ?Take 5-10 mg by mouth daily as needed for mild constipation. ?  ?carisoprodol 350 MG tablet ?Commonly known as: SOMA ?Take 175 mg by mouth 3 (three) times daily. ?  ?D 1000 25 MCG (1000 UT) capsule ?Generic drug: Cholecalciferol ?  ?dorzolamide-timolol 22.3-6.8 MG/ML ophthalmic solution ?Commonly known as: COSOPT ?Place 1 drop into the left eye 2 (two) times daily. ?  ?estradiol 0.5 MG tablet ?Commonly known as: ESTRACE ?Take 0.5 mg by mouth at bedtime. ?  ?Fish Oil 1000 MG Caps ?  ?fluocinonide ointment 0.05 % ?Commonly known as: LIDEX ?Apply 1 application topically 2 (two) times daily. ?  ?fluticasone 50 MCG/ACT nasal spray ?Commonly known as: FLONASE ?Place 2 sprays into both nostrils daily. ?  ?furosemide 40 MG tablet ?Commonly known as: LASIX ?Take 1 tablet by mouth twice daily ?  ?hydrochlorothiazide 25 MG tablet ?Commonly known as: HYDRODIURIL ?TAKE 1 TABLET BY MOUTH  DAILY ?  ?levothyroxine 88 MCG tablet ?Commonly known as: SYNTHROID ?TAKE 1 TABLET BY MOUTH  DAILY BEFORE BREAKFAST ?  ?NONFORMULARY OR COMPOUNDED ITEM ?Compression socks  20-30 mm/ hg   Dx low ext edema ?  ?oxyCODONE-acetaminophen 10-325 MG tablet ?Commonly known as: PERCOCET ?Take 1 tablet by mouth every 4 (four) hours as needed for pain. ?  ?potassium chloride SA 20 MEQ  tablet ?Commonly known as: KLOR-CON M ?Take 2 tablets (40 mEq total) by mouth daily. ?  ?simvastatin 20 MG tablet ?Commonly known as: ZOCOR ?TAKE 1 TABLET BY MOUTH AT  BEDTIME ?  ?spironolactone 25 MG tablet ?Commonly known as

## 2021-10-04 NOTE — Patient Instructions (Signed)
-   Take Augmentin 1 tablet with Food TWICE a day for 10 days  ?

## 2021-10-05 MED ORDER — LEVOTHYROXINE SODIUM 88 MCG PO TABS: 88.0000 ug | ORAL_TABLET | Freq: Every day | ORAL | 3 refills | Status: DC

## 2021-10-06 ENCOUNTER — Ambulatory Visit: Payer: Medicare Other | Admitting: Family Medicine

## 2021-10-10 DIAGNOSIS — L820 Inflamed seborrheic keratosis: Secondary | ICD-10-CM | POA: Diagnosis not present

## 2021-10-10 DIAGNOSIS — L308 Other specified dermatitis: Secondary | ICD-10-CM | POA: Diagnosis not present

## 2021-10-17 ENCOUNTER — Ambulatory Visit (HOSPITAL_BASED_OUTPATIENT_CLINIC_OR_DEPARTMENT_OTHER)
Admission: RE | Admit: 2021-10-17 | Discharge: 2021-10-17 | Disposition: A | Discharge status: Home / Self Care | Payer: Medicare Other | Source: Ambulatory Visit | Attending: Family Medicine | Admitting: Family Medicine

## 2021-10-17 ENCOUNTER — Ambulatory Visit (INDEPENDENT_AMBULATORY_CARE_PROVIDER_SITE_OTHER): Payer: Medicare Other | Admitting: Family Medicine

## 2021-10-17 ENCOUNTER — Encounter: Payer: Self-pay | Admitting: Family Medicine

## 2021-10-17 VITALS — BP 130/78 | HR 70 | Temp 98.2°F | Resp 16 | Ht 60.0 in | Wt 160.8 lb

## 2021-10-17 DIAGNOSIS — R0902 Hypoxemia: Secondary | ICD-10-CM | POA: Diagnosis not present

## 2021-10-17 DIAGNOSIS — J449 Chronic obstructive pulmonary disease, unspecified: Secondary | ICD-10-CM | POA: Diagnosis not present

## 2021-10-17 DIAGNOSIS — J439 Emphysema, unspecified: Secondary | ICD-10-CM | POA: Diagnosis not present

## 2021-10-17 DIAGNOSIS — R918 Other nonspecific abnormal finding of lung field: Secondary | ICD-10-CM | POA: Diagnosis not present

## 2021-10-17 DIAGNOSIS — I10 Essential (primary) hypertension: Secondary | ICD-10-CM | POA: Diagnosis not present

## 2021-10-17 DIAGNOSIS — F17200 Nicotine dependence, unspecified, uncomplicated: Secondary | ICD-10-CM | POA: Diagnosis not present

## 2021-10-17 DIAGNOSIS — R0602 Shortness of breath: Secondary | ICD-10-CM | POA: Diagnosis not present

## 2021-10-17 DIAGNOSIS — R600 Localized edema: Secondary | ICD-10-CM

## 2021-10-17 MED ORDER — FUROSEMIDE 40 MG PO TABS: 80.0000 mg | ORAL_TABLET | Freq: Every day | ORAL | 1 refills | Status: DC

## 2021-10-17 MED ORDER — ALBUTEROL SULFATE HFA 108 (90 BASE) MCG/ACT IN AERS: 2.0000 | INHALATION_SPRAY | Freq: Four times a day (QID) | RESPIRATORY_TRACT | 2 refills | Status: DC | PRN

## 2021-10-17 MED ORDER — TRELEGY ELLIPTA 100-62.5-25 MCG/ACT IN AEPB: 1.0000 | INHALATION_SPRAY | Freq: Every day | RESPIRATORY_TRACT | 11 refills | Status: DC

## 2021-10-17 NOTE — Patient Instructions (Signed)
Chronic Obstructive Pulmonary Disease ?Chronic obstructive pulmonary disease (COPD) is a long-term (chronic) condition that affects the lungs. COPD is a general term that can be used to describe many different lung problems that cause lung inflammation and limit airflow, including chronic bronchitis and emphysema. ?If you have COPD, your lung function will probably never return to normal. In most cases, it gets worse over time. However, there are steps you can take to slow the progression of the disease and improve your quality of life. ?What are the causes? ?This condition may be caused by: ?Smoking. This is the most common cause. ?Certain genes passed down through families. ?What increases the risk? ?The following factors may make you more likely to develop this condition: ?Being exposed to secondhand smoke from cigarettes, pipes, or cigars. ?Being exposed to chemicals and other irritants, such as fumes and dust in the work environment. ?Having chronic lung conditions or infections. ?What are the signs or symptoms? ?Symptoms of this condition include: ?Shortness of breath, especially during physical activity. ?Chronic cough with a large amount of thick mucus. Sometimes, the cough may not have any mucus (dry cough). ?Wheezing and rapid breathing. ?Gray or bluish discoloration (cyanosis) of the skin, especially in the fingers, toes, or lips. ?Feeling tired (fatigue). ?Weight loss. ?Chest tightness. ?Frequent infections. ?Episodes when breathing symptoms become much worse (exacerbations). ?At the later stages of this disease, you may have swelling in the ankles, feet, or legs. ?How is this diagnosed? ?This condition is diagnosed based on: ?Your medical history. ?A physical exam. ?You may also have tests, including: ?Lung (pulmonary) function tests. This may include a spirometry test, which measures your ability to exhale properly. ?Chest X-ray. ?CT scan. ?Blood tests. ?How is this treated? ?This condition may be  treated with: ?Medicines. These may include inhaled rescue medicines to treat acute exacerbations as well as medicines that you take long-term (maintenance medicines) to prevent flare-ups of COPD. ?Bronchodilators help treat COPD by dilating the airways to allow increased airflow and make your breathing more comfortable. ?Steroids can reduce airway inflammation and help prevent exacerbations. ?Smoking cessation. If you smoke, your health care provider may ask you to quit, and may also recommend therapy or replacement products to help you quit. ?Pulmonary rehabilitation. This may involve working with a team of health care providers and specialists, such as respiratory, occupational, and physical therapists. ?Exercise and physical activity. These are beneficial for nearly all people with COPD. ?Nutrition therapy to gain weight, if you are underweight. ?Oxygen. Supplemental oxygen therapy is only helpful if you have a low oxygen level in your blood (hypoxemia). ?Lung surgery or transplant. ?Palliative care. This is to help people with COPD feel comfortable when treatment is no longer working. ?Follow these instructions at home: ?Medicines ?Take over-the-counter and prescription medicines only as told by your health care provider. This includes inhaled medicines and pills. ?Talk to your health care provider before taking any cough or allergy medicines. You may need to avoid certain medicines that dry out your airways. ?Lifestyle ?If you smoke, the most important thing that you can do is to stop smoking. Continuing to smoke will cause the disease to progress faster. ?Do not use any products that contain nicotine or tobacco. These products include cigarettes, chewing tobacco, and vaping devices, such as e-cigarettes. If you need help quitting, ask your health care provider. ?Avoid exposure to things that irritate your lungs, such as smoke, chemicals, and fumes. ?Stay active, but balance activity with periods of rest.  Exercise and physical   activity will help you maintain your ability to do things you want to do. ?Learn and use relaxation techniques to manage stress and to control your breathing. ?Get the right amount of sleep and get quality sleep. Most adults need 7 or more hours per night. ?Eat healthy foods. Eating smaller, more frequent meals and resting before meals may help you maintain your strength. ?Controlled breathing ?Learn and use controlled breathing techniques as directed by your health care provider. Controlled breathing techniques include: ?Pursed lip breathing. Start by breathing in (inhaling) through your nose for 1 second. Then, purse your lips as if you were going to whistle and breathe out (exhale) through the pursed lips for 2 seconds. ?Diaphragmatic breathing. Start by putting one hand on your abdomen just above your waist. Inhale slowly through your nose. The hand on your abdomen should move out. Then purse your lips and exhale slowly. You should be able to feel the hand on your abdomen moving in as you exhale. ? ?Controlled coughing ?Learn and use controlled coughing to clear mucus from your lungs. Controlled coughing is a series of short, progressive coughs. The steps of controlled coughing are: ?Lean your head slightly forward. ?Breathe in deeply using diaphragmatic breathing. ?Try to hold your breath for 3 seconds. ?Keep your mouth slightly open while coughing twice. ?Spit any mucus out into a tissue. ?Rest and repeat the steps once or twice as needed. ?General instructions ?Make sure you receive all the vaccines that your health care provider recommends, especially the pneumococcal and influenza vaccines. Preventing infection and hospitalization is very important when you have COPD. ?Drink enough fluid to keep your urine pale yellow, unless you have a medical condition that requires fluid restriction. ?Use oxygen therapy and pulmonary rehabilitation if told by your health care provider. If you  require home oxygen therapy, ask your health care provider whether you should purchase a pulse oximeter to measure your oxygen level at home. ?Work with your health care provider to develop a COPD action plan. This will help you know what steps to take if your condition gets worse. ?Keep other chronic health conditions under control as told by your health care provider. ?Avoid extreme temperature and humidity changes. ?Avoid contact with people who have an illness that spreads from person to person (is contagious), such as viral infections or pneumonia. ?Keep all follow-up visits. This is important. ?Contact a health care provider if: ?You are coughing up more mucus than usual. ?There is a change in the color or thickness of your mucus. ?Your breathing is more labored than usual. ?Your breathing is faster than usual. ?You have difficulty sleeping. ?You need to use your rescue medicines or inhalers more often than expected. ?You have trouble doing routine activities such as getting dressed or walking around the house. ?Get help right away if: ?You have shortness of breath while you are resting. ?You have shortness of breath that prevents you from: ?Being able to talk. ?Performing your usual physical activities. ?You have chest pain lasting longer than 5 minutes. ?Your skin color is more blue (cyanotic) than usual. ?You measure low oxygen saturations for longer than 5 minutes with a pulse oximeter. ?You have a fever. ?You feel too tired to breathe normally. ?These symptoms may represent a serious problem that is an emergency. Do not wait to see if the symptoms will go away. Get medical help right away. Call your local emergency services (911 in the [place] Do not drive yourself to the hospital. ?Summary ?Chronic obstructive pulmonary   disease (COPD) is a long-term (chronic) condition that affects the lungs. ?Your lung function will probably never return to normal. In most cases, it gets worse over time. However, there  are steps you can take to slow the progression of the disease and improve your quality of life. ?Treatment for COPD may include taking medicines, quitting smoking, pulmonary rehabilitation, and changes to diet and e

## 2021-10-17 NOTE — Progress Notes (Addendum)
? ?Established Patient Office Visit ? ?Subjective:  ?Patient ID: Joan [name], female    DOB: 1946-07-05  Age: 76 y.o. MRN: [id number] ? ?CC:  ?Chief Complaint  ?Patient presents with  ? Leg Swelling  ?  Here for leg swollen   ? ? ?HPI ?Joan [name] presents for swelling in her legs but this has resolved with doubling her lasix.  She did have an episode of sob last week but that has not occurred since except she does admit to getting sob with walking up here today.    ? ?Past Medical History:  ?Diagnosis Date  ? Arthritis   ? "shoulders; wrist; probably spine" (06/24/2013)  ? Blood transfusion without reported diagnosis   ? Cancer [facility])   ? skin cancer right leg x 4    ? Cataract   ? removed both eyes   ? Chronic low back pain   ? followed by Dr Hardin Negus pain mgt  ? Colon polyp   ? Constipation   ? COPD (chronic obstructive pulmonary disease) ([place])   ? mild  ? Esophageal stricture   ? Finger pain, left   ? 2 fingers on left hand since wrist surgery  ? GERD (gastroesophageal reflux disease)   ? Glaucoma   ? Heart murmur   ? "slight; not on RX" (06/24/2013)  ? History of cardiac arrhythmia   ? cardiologist- Traci Turner  ? Hx of colonic polyps 08/28/2004  ? Hyperlipemia   ? Hypertension   ? Hypothyroidism   ? Osteoarthritis of left knee   ? advanced  ? PONV (postoperative nausea and vomiting)   ? Pt reports symptoms are the result of gallbladder and cholecystectomy, not anesthesia  ? PVC's (premature ventricular contractions)   ? Sleep apnea 1990's  ? "tested; tried mask; couldn't stand it; told me as long as I slept on my side I'd be ok" (06/24/2013)  ? Thyroid nodule   ? Tobacco abuse   ? ? ?Past Surgical History:  ?Procedure Laterality Date  ? ABDOMINAL HYSTERECTOMY  1988  ? "partial" (06/24/2013)  ? ABDOMINAL HYSTERECTOMY    ? partial in 1988  ? ANKLE SURGERY Left 1995  ? "tendon repair" (06/24/2013)  ? BACK SURGERY    ? "think today was my 8th back OR" (06/24/2013)  ? CERVICAL SPINE SURGERY  2012  ?  [place]  ? CHOLECYSTECTOMY N/A 04/16/2013  ? Procedure: LAPAROSCOPIC CHOLECYSTECTOMY ;  Surgeon: Imogene Burn. Georgette Dover, MD;  Location: [facility];  Service: General;  Laterality: N/A;  ? COLONOSCOPY    ? ELBOW SURGERY  1990's  ? FOOT SURGERY Right 2012  ? SPUR REMOVED  ? INFUSION PUMP IMPLANTATION  1990's  ? "implantablet morphine pump; took it out w/in 11 months  ? KNEE ARTHROSCOPY Left 1991; ~ 1993  ? LAPAROSCOPIC LYSIS OF ADHESIONS N/A 04/16/2013  ? Procedure: LAPAROSCOPIC LYSIS OF ADHESIONS;  Surgeon: Imogene Burn. Georgette Dover, MD;  Location: [facility];  Service: General;  Laterality: N/A;  ? LUMBAR FUSION    ? and rods  ? LUMBAR LAMINECTOMY/DECOMPRESSION MICRODISCECTOMY N/A 11/28/2012  ? Procedure: DECOMPRESSIVE LUMBAR LAMINECTOMY LEVEL 1;  Surgeon: Elaina Hoops, MD;  Location: [facility];  Service: Neurosurgery;  Laterality: N/A;  DECOMPRESSIVE LUMBAR LAMINECTOMY LEVEL 1  ? ORIF DISTAL RADIUS FRACTURE Left 12/30/2013  ? dr Caralyn Guile  ? ORIF WRIST FRACTURE Left 12/30/2013  ? Procedure: OPEN REDUCTION INTERNAL FIXATION (ORIF) LEFT WRIST FRACTURE AND REPAIR AS INDICATED;  Surgeon: Linna Hoff,  MD;  Location: [facility];  Service: Orthopedics;  Laterality: Left;  ? POLYPECTOMY    ? POSTERIOR FUSION LUMBAR SPINE  06/24/2013  ? ROBOTIC ASSISTED SALPINGO OOPHERECTOMY Bilateral 08/20/2014  ? Procedure: ROBOTIC ASSISTED SALPINGO OOPHORECTOMY;  Surgeon: Daria Pastures, MD;  Location: [facility];  Service: Gynecology;  Laterality: Bilateral;  ? SHOULDER ARTHROSCOPY Left 09/2011  ? THYROIDECTOMY  2012  ? TOTAL HIP ARTHROPLASTY Left 04/27/2019  ? Procedure: TOTAL HIP ARTHROPLASTY ANTERIOR APPROACH;  Surgeon: Gaynelle Arabian, MD;  Location: [facility];  Service: Orthopedics;  Laterality: Left;  140mn  ? TOTAL HIP ARTHROPLASTY Right 09/09/2019  ? Procedure: TOTAL HIP ARTHROPLASTY ANTERIOR APPROACH;  Surgeon: AGaynelle Arabian MD;  Location: [facility];  Service: Orthopedics;  Laterality: Right;  1086m  ? TOTAL KNEE ARTHROPLASTY Right 09/09/2017  ?  Procedure: RIGHT TOTAL KNEE ARTHROPLASTY;  Surgeon: AlGaynelle ArabianMD;  Location: [facility];  Service: Orthopedics;  Laterality: Right;  ? UPPER GASTROINTESTINAL ENDOSCOPY    ? VITRECTOMY Right 10/18/2020  ? ? ?Family History  ?Problem Relation Age of Onset  ? Pancreatic cancer Father   ? Cancer Father   ? Diabetes type II Mother   ? Hypertension Mother   ? Coronary artery disease Mother [id number]? Diabetes Mother   ? Thyroid cancer Mother   ? Skin cancer Mother   ? Diabetes Sister   ? Hypertension Sister   ? Bone cancer Sister   ? Breast cancer Sister   ? Hypertension Brother   ? Breast cancer Maternal Aunt   ? Other Neg Hx   ?     No family history of  colon cancer  ? Colon cancer Neg Hx   ? Colon polyps Neg Hx   ? Esophageal cancer Neg Hx   ? Stomach cancer Neg Hx   ? Rectal cancer Neg Hx   ? ? ?Social History  ? ?Socioeconomic History  ? Marital status: Divorced  ?  Spouse name: Not on file  ? Number of children: Not on file  ? Years of education: Not on file  ? Highest education level: Not on file  ?Occupational History  ? Not on file  ?Tobacco Use  ? Smoking status: Every Day  ?  Packs/day: 1.50  ?  Years: 50.00  ?  Pack years: 75.00  ?  Types: Cigarettes  ? Smokeless tobacco: Never  ? Tobacco comments:  ?  she has not been able to cut down   ?Vaping Use  ? Vaping Use: Never used  ?Substance and Sexual Activity  ? Alcohol use: No  ?  Alcohol/week: 0.0 standard drinks  ? Drug use: No  ? Sexual activity: Not Currently  ?Other Topics Concern  ? Not on file  ?Social History Narrative  ? Divorced  ? Current Smoker  1 ppd -  20 yrs     ? Alcohol use-no    ?   [employer] - laid off   ?   ? Physician roster:  ? Dr. MaElta Guadeloupehilips - pain management  ? Dr. HoPhilis Pique GYN  ? Dr. CrSaintclair Halsted Neurosurgery  ? Dr. JoRonnald Ramp dermatology  ? Dr. OrCaralyn Guile orthopedics  ? Dr. TrFransico Him cardiology  ? Dr. Miller-ophthalmology  ? ?Social Determinants of Health  ? ?Financial Resource Strain: Low Risk   ? Difficulty of Paying  Living Expenses: Not hard at all  ?Food Insecurity: No Food Insecurity  ? Worried About RuCharity fundraisern the Last  Year: Never true  ? Ran Out of Food in the Last Year: Never true  ?Transportation Needs: No Transportation Needs  ? Lack of Transportation (Medical): No  ? Lack of Transportation (Non-Medical): No  ?Physical Activity: Inactive  ? Days of Exercise per Week: 0 days  ? Minutes of Exercise per Session: 0 min  ?Stress: No Stress Concern Present  ? Feeling of Stress : Not at all  ?Social Connections: Socially Isolated  ? Frequency of Communication with Friends and Family: More than three times a week  ? Frequency of Social Gatherings with Friends and Family: Once a week  ? Attends Religious Services: Never  ? Active Member of Clubs or Organizations: No  ? Attends Archivist Meetings: Never  ? Marital Status: Divorced  ?Intimate Partner Violence: Not At Risk  ? Fear of Current or Ex-Partner: No  ? Emotionally Abused: No  ? Physically Abused: No  ? Sexually Abused: No  ? ? ?Outpatient Medications Prior to Visit  ?Medication Sig Dispense Refill  ? bisacodyl (DULCOLAX) 5 MG EC tablet Take 5-10 mg by mouth daily as needed for mild constipation.  30 tablet   ? carisoprodol (SOMA) 350 MG tablet Take 175 mg by mouth 3 (three) times daily.     ? dorzolamide-timolol (COSOPT) 22.3-6.8 MG/ML ophthalmic solution Place 1 drop into the left eye 2 (two) times daily.    ? estradiol (ESTRACE) 0.5 MG tablet Take 0.5 mg by mouth at bedtime.   4  ? fluocinonide ointment (LIDEX) 8.10 % Apply 1 application topically 2 (two) times daily.    ? fluticasone (FLONASE) 50 MCG/ACT nasal spray Place 2 sprays into both nostrils daily. 16 g 6  ? hydrochlorothiazide (HYDRODIURIL) 25 MG tablet TAKE 1 TABLET BY MOUTH  DAILY 90 tablet 3  ? levothyroxine (SYNTHROID) 88 MCG tablet Take 1 tablet (88 mcg total) by mouth daily before breakfast. 90 tablet 3  ? NONFORMULARY OR COMPOUNDED ITEM Compression socks  20-30 mm/ hg   Dx low ext  edema 1 each 1  ? oxyCODONE-acetaminophen (PERCOCET) 10-325 MG tablet Take 1 tablet by mouth every 4 (four) hours as needed for pain.     ? potassium chloride SA (KLOR-CON) 20 MEQ tablet Take 2 tablets (40 mEq tota

## 2021-10-17 NOTE — Assessment & Plan Note (Signed)
con't to smoke ?

## 2021-10-17 NOTE — Assessment & Plan Note (Signed)
Inhalers refilled--- pt ran out  ?cxr ?Refer back to pulmonary ?

## 2021-10-18 ENCOUNTER — Telehealth: Payer: Self-pay | Admitting: Family Medicine

## 2021-10-18 ENCOUNTER — Telehealth: Payer: Self-pay | Admitting: *Deleted

## 2021-10-18 LAB — CBC WITH DIFFERENTIAL/PLATELET
Basophils Absolute: 0 10*3/uL (ref 0.0–0.1)
Basophils Relative: 0.6 % (ref 0.0–3.0)
Eosinophils Absolute: 0.1 10*3/uL (ref 0.0–0.7)
Eosinophils Relative: 1.3 % (ref 0.0–5.0)
HCT: 40.6 % (ref 36.0–46.0)
Hemoglobin: 13.8 g/dL (ref 12.0–15.0)
Lymphocytes Relative: 29.8 % (ref 12.0–46.0)
Lymphs Abs: 1.8 10*3/uL (ref 0.7–4.0)
MCHC: 34 g/dL (ref 30.0–36.0)
MCV: 87.4 fl (ref 78.0–100.0)
Monocytes Absolute: 0.9 10*3/uL (ref 0.1–1.0)
Monocytes Relative: 14.5 % — ABNORMAL HIGH (ref 3.0–12.0)
Neutro Abs: 3.2 10*3/uL (ref 1.4–7.7)
Neutrophils Relative %: 53.8 % (ref 43.0–77.0)
Platelets: 239 10*3/uL (ref 150.0–400.0)
RBC: 4.64 Mil/uL (ref 3.87–5.11)
RDW: 13.3 % (ref 11.5–15.5)
WBC: 6 10*3/uL (ref 4.0–10.5)

## 2021-10-18 LAB — COMPREHENSIVE METABOLIC PANEL
ALT: 12 U/L (ref 0–35)
AST: 17 U/L (ref 0–37)
Albumin: 4.5 g/dL (ref 3.5–5.2)
Alkaline Phosphatase: 99 U/L (ref 39–117)
BUN: 15 mg/dL (ref 6–23)
CO2: 35 mEq/L — ABNORMAL HIGH (ref 19–32)
Calcium: 8.6 mg/dL (ref 8.4–10.5)
Chloride: 92 mEq/L — ABNORMAL LOW (ref 96–112)
Creatinine, Ser: 0.85 mg/dL (ref 0.40–1.20)
GFR: 66.31 mL/min (ref >60.00)
Glucose, Bld: 109 mg/dL — ABNORMAL HIGH (ref 70–99)
Potassium: 3 mEq/L — ABNORMAL LOW (ref 3.5–5.1)
Sodium: 138 mEq/L (ref 135–145)
Total Bilirubin: 0.6 mg/dL (ref 0.2–1.2)
Total Protein: 6.8 g/dL (ref 6.0–8.3)

## 2021-10-18 LAB — LIPID PANEL
Cholesterol: 185 mg/dL (ref 0–200)
HDL: 57.2 mg/dL (ref >39.00)
NonHDL: 127.63
Total CHOL/HDL Ratio: 3
Triglycerides: 209 mg/dL — ABNORMAL HIGH (ref 0.0–149.0)
VLDL: 41.8 mg/dL — ABNORMAL HIGH (ref 0.0–40.0)

## 2021-10-18 LAB — LDL CHOLESTEROL, DIRECT: Direct LDL: 98 mg/dL

## 2021-10-18 NOTE — Telephone Encounter (Signed)
Left message for pt to return my call.   Received call from lab, they are unable to run BNP from sample that was submitted and we need to redraw that one test. ?

## 2021-10-18 NOTE — Telephone Encounter (Signed)
Pt stated she was supposed to get a call about getting oxygen and has not heard anything back. Please advise.  ?

## 2021-10-18 NOTE — Telephone Encounter (Signed)
Pt spoke with front office and scheduled lab appt for tomorrow at 2:45pm.  Specimen will need to be frozen for overnight storage. ?

## 2021-10-18 NOTE — Telephone Encounter (Signed)
Orders have been faxed

## 2021-10-19 ENCOUNTER — Other Ambulatory Visit (INDEPENDENT_AMBULATORY_CARE_PROVIDER_SITE_OTHER): Payer: Medicare Other

## 2021-10-19 ENCOUNTER — Encounter: Payer: Self-pay | Admitting: Family Medicine

## 2021-10-19 ENCOUNTER — Telehealth: Payer: Self-pay | Admitting: Family Medicine

## 2021-10-19 DIAGNOSIS — R0902 Hypoxemia: Secondary | ICD-10-CM | POA: Diagnosis not present

## 2021-10-19 NOTE — Telephone Encounter (Signed)
[facility] is stating they need more information for the Oxygen request since the saturation of 90 at rest does not fully qualify her for oxygen according to chart notes they have. She states that the patient has OV today and if the patient could be walked with oxygen and those office notes be faxed to them, it would qualify her. Office notes can be faxed to [phone]. Please advise.  ?

## 2021-10-19 NOTE — Addendum Note (Signed)
Addended by: Kelle Darting A on: 10/19/2021 02:28 PM ? ? Modules accepted: Orders ? ?

## 2021-10-19 NOTE — Telephone Encounter (Signed)
Office note needs to be updated for O2. Pt had 86 O2 while walking, 94 at rest and 96 with 02 ?

## 2021-10-19 NOTE — Telephone Encounter (Signed)
Office note updated per [place]. Updated office notes, demo, and insurance card sent to [place] [facility] ?

## 2021-10-19 NOTE — Progress Notes (Signed)
No charge, redraw. 

## 2021-10-20 LAB — BRAIN NATRIURETIC PEPTIDE: Pro B Natriuretic peptide (BNP): 23 pg/mL (ref 0.0–100.0)

## 2021-10-20 NOTE — Telephone Encounter (Signed)
Faxed and received confirmation

## 2021-10-20 NOTE — Telephone Encounter (Signed)
[facility] is calling back to let us know that a corrected order for the pt to get the portable oxygen concentrator has been faxed to [place]. It just needs to be signed and faxed back. Fax number verified.  ?

## 2021-10-23 NOTE — Telephone Encounter (Signed)
Spoke with patient. Pt states O2 will be coming tomorrow.  ?

## 2021-10-24 DIAGNOSIS — J449 Chronic obstructive pulmonary disease, unspecified: Secondary | ICD-10-CM | POA: Diagnosis not present

## 2021-10-24 DIAGNOSIS — R0902 Hypoxemia: Secondary | ICD-10-CM | POA: Diagnosis not present

## 2021-10-26 ENCOUNTER — Other Ambulatory Visit: Payer: Self-pay | Admitting: Family Medicine

## 2021-10-26 DIAGNOSIS — G4700 Insomnia, unspecified: Secondary | ICD-10-CM

## 2021-11-07 ENCOUNTER — Ambulatory Visit: Payer: Medicare Other | Admitting: Emergency Medicine

## 2021-11-07 ENCOUNTER — Encounter: Payer: Self-pay | Admitting: Emergency Medicine

## 2021-11-07 DIAGNOSIS — J449 Chronic obstructive pulmonary disease, unspecified: Secondary | ICD-10-CM

## 2021-11-07 DIAGNOSIS — R0902 Hypoxemia: Secondary | ICD-10-CM | POA: Diagnosis not present

## 2021-11-07 DIAGNOSIS — R918 Other nonspecific abnormal finding of lung field: Secondary | ICD-10-CM | POA: Diagnosis not present

## 2021-11-07 NOTE — Assessment & Plan Note (Signed)
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  Plan to repeat her CT to compare with 2021 ?

## 2021-11-07 NOTE — Addendum Note (Signed)
Addended by: Gavin Potters R on: 11/07/2021 03:00 PM ? ? Modules accepted: Orders ? ?

## 2021-11-07 NOTE — Assessment & Plan Note (Signed)
Evolving symptoms of dyspnea.  Suspect her obstructive lung disease is playing a part here.  She may have improved some after initiation of Trelegy.  She has lower extremity edema, question either systolic or diastolic CHF, possible pulmonary hypertension.  Will need to work-up further.  She follows with Dr. Davina Poke in cardiology and I will try to set her up to see Dr. Davina Poke to start the process. ?

## 2021-11-07 NOTE — Patient Instructions (Addendum)
We reviewed your prior pulmonary function testing.  We discussed possibly repeating these, will hold off for now. ?Please continue Trelegy 1 inhalation once daily.  Rinse and gargle after using. ?Keep albuterol available to use 2 puffs up to every 4 hours if needed for shortness of breath, chest tightness, wheezing.  ?We will perform walking oximetry today on room air to determine if you continue to require oxygen when you are exerting yourself. ?We will perform an overnight oximetry on room air to see if you need to sleep with oxygen on. ?We will plan to repeat a CT scan of the chest at [facility] to follow pulmonary nodules seen in 2021. ?We will arrange for you to see Dr. Davina Poke with cardiology in follow-up. ? ?

## 2021-11-07 NOTE — Progress Notes (Signed)
? ?Subjective:  ? ? Patient ID: Joan [name], female    DOB: Dec 08, 1945, 76 y.o.   MRN: [id number] ? ?HPI ?76 year old active smoker (80 pack years), history of associated COPD.  Also with GERD, chronic low back pain, hypertension, hyperlipidemia, untreated obstructive sleep apnea.  She has been seen in our office before by Dr. Melvyn Novas for COPD, pulmonary nodular disease with CT chest as below ?She is referred today for evaluation of her COPD, hypoxemia and her pulmonary nodules. ?Currently managed on Trelegy (new), albuterol which she uses very rarely.  ?Flonase  ?She has cut her cigarettes in half > 1 pk/day.  ? ?She has been experiencing some exertional SOB w cleaning, bending, also once at rest beginning in March. No clear precipitating event. No CP, occasional cough, no wheeze. She was started on trelegy after evaluated for this. She was found to be hypoxic SpO2 82% at Dr Nonda Lou office > was started on supplemental O2. Has a POC at home, ? Whether if is working.  ? ?PFT 01/28/20 reviewed by me, moderately severe COPD.  ? ?Most recent CT scan of the chest from 04/18/2020 reviewed by me showed waxing and waning pulmonary nodular disease with right upper lobe nodule 4 mm (down from 7 mm 01/22/2020), new multiple clustered nodules in the right middle and right lower lobes, left upper lobe, largest 6 mm. ? ?Review of Systems ?As per HPI ? ? ?Past Medical History:  ?Diagnosis Date  ? Arthritis   ? "shoulders; wrist; probably spine" (06/24/2013)  ? Blood transfusion without reported diagnosis   ? Cancer [facility])   ? skin cancer right leg x 4    ? Cataract   ? removed both eyes   ? Chronic low back pain   ? followed by Dr Hardin Negus pain mgt  ? Colon polyp   ? Constipation   ? COPD (chronic obstructive pulmonary disease) ([place])   ? mild  ? Esophageal stricture   ? Finger pain, left   ? 2 fingers on left hand since wrist surgery  ? GERD (gastroesophageal reflux disease)   ? Glaucoma   ? Heart murmur   ? "slight; not on RX"  (06/24/2013)  ? History of cardiac arrhythmia   ? cardiologist- Traci Turner  ? Hx of colonic polyps 08/28/2004  ? Hyperlipemia   ? Hypertension   ? Hypothyroidism   ? Osteoarthritis of left knee   ? advanced  ? PONV (postoperative nausea and vomiting)   ? Pt reports symptoms are the result of gallbladder and cholecystectomy, not anesthesia  ? PVC's (premature ventricular contractions)   ? Sleep apnea 1990's  ? "tested; tried mask; couldn't stand it; told me as long as I slept on my side I'd be ok" (06/24/2013)  ? Thyroid nodule   ? Tobacco abuse   ?  ? ?Family History  ?Problem Relation Age of Onset  ? Pancreatic cancer Father   ? Cancer Father   ? Diabetes type II Mother   ? Hypertension Mother   ? Coronary artery disease Mother 23  ? Diabetes Mother   ? Thyroid cancer Mother   ? Skin cancer Mother   ? Diabetes Sister   ? Hypertension Sister   ? Bone cancer Sister   ? Breast cancer Sister   ? Hypertension Brother   ? Breast cancer Maternal Aunt   ? Other Neg Hx   ?     No family history of  colon cancer  ? Colon cancer Neg Hx   ?  Colon polyps Neg Hx   ? Esophageal cancer Neg Hx   ? Stomach cancer Neg Hx   ? Rectal cancer Neg Hx   ?  ? ?Social History  ? ?Socioeconomic History  ? Marital status: Divorced  ?  Spouse name: Not on file  ? Number of children: Not on file  ? Years of education: Not on file  ? Highest education level: Not on file  ?Occupational History  ? Not on file  ?Tobacco Use  ? Smoking status: Every Day  ?  Packs/day: 1.50  ?  Years: 50.00  ?  Pack years: 75.00  ?  Types: Cigarettes  ? Smokeless tobacco: Never  ? Tobacco comments:  ?  10 cigarettes smoked daily ARJ 11/07/21  ?Vaping Use  ? Vaping Use: Never used  ?Substance and Sexual Activity  ? Alcohol use: No  ?  Alcohol/week: 0.0 standard drinks  ? Drug use: No  ? Sexual activity: Not Currently  ?Other Topics Concern  ? Not on file  ?Social History Narrative  ? Divorced  ? Current Smoker  1 ppd -  20 yrs     ? Alcohol use-no    ?   [employer] - laid off   ?   ? Physician roster:  ? Dr. Elta Guadeloupe Philips - pain management  ? Dr. Philis Pique - GYN  ? Dr. Saintclair Halsted - Neurosurgery  ? Dr. Ronnald Ramp - dermatology  ? Dr. Caralyn Guile - orthopedics  ? Dr. Fransico Him - cardiology  ? Dr. Miller-ophthalmology  ? ?Social Determinants of Health  ? ?Financial Resource Strain: Low Risk   ? Difficulty of Paying Living Expenses: Not hard at all  ?Food Insecurity: No Food Insecurity  ? Worried About Charity fundraiser in the Last Year: Never true  ? Ran Out of Food in the Last Year: Never true  ?Transportation Needs: No Transportation Needs  ? Lack of Transportation (Medical): No  ? Lack of Transportation (Non-Medical): No  ?Physical Activity: Inactive  ? Days of Exercise per Week: 0 days  ? Minutes of Exercise per Session: 0 min  ?Stress: No Stress Concern Present  ? Feeling of Stress : Not at all  ?Social Connections: Socially Isolated  ? Frequency of Communication with Friends and Family: More than three times a week  ? Frequency of Social Gatherings with Friends and Family: Once a week  ? Attends Religious Services: Never  ? Active Member of Clubs or Organizations: No  ? Attends Archivist Meetings: Never  ? Marital Status: Divorced  ?Intimate Partner Violence: Not At Risk  ? Fear of Current or Ex-Partner: No  ? Emotionally Abused: No  ? Physically Abused: No  ? Sexually Abused: No  ?  ? ?Allergies  ?Allergen Reactions  ? Morphine And Related Swelling  ? 5-Alpha Reductase Inhibitors   ? Amitriptyline Swelling  ?  Leg swelling  ? Augmentin [Amoxicillin-Pot Clavulanate] Nausea And Vomiting  ? Gabapentin Itching  ? Triamterene Itching and Rash  ?  ? ?Outpatient Medications Prior to Visit  ?Medication Sig Dispense Refill  ? albuterol (VENTOLIN HFA) 108 (90 Base) MCG/ACT inhaler Inhale 2 puffs into the lungs every 6 (six) hours as needed for wheezing or shortness of breath. 6.7 g 2  ? bisacodyl (DULCOLAX) 5 MG EC tablet Take 5-10 mg by mouth daily as needed for mild  constipation.  30 tablet   ? carisoprodol (SOMA) 350 MG tablet Take 175 mg by mouth 3 (three) times  daily.     ? dorzolamide-timolol (COSOPT) 22.3-6.8 MG/ML ophthalmic solution Place 1 drop into the left eye 2 (two) times daily.    ? estradiol (ESTRACE) 0.5 MG tablet Take 0.5 mg by mouth at bedtime.   4  ? fluocinonide ointment (LIDEX) 2.26 % Apply 1 application topically 2 (two) times daily.    ? fluticasone (FLONASE) 50 MCG/ACT nasal spray Place 2 sprays into both nostrils daily. 16 g 6  ? Fluticasone-Umeclidin-Vilant (TRELEGY ELLIPTA) 100-62.5-25 MCG/ACT AEPB Inhale 1 puff into the lungs daily. 1 each 11  ? furosemide (LASIX) 40 MG tablet Take 2 tablets (80 mg total) by mouth daily. 180 tablet 1  ? hydrochlorothiazide (HYDRODIURIL) 25 MG tablet TAKE 1 TABLET BY MOUTH  DAILY 90 tablet 3  ? levothyroxine (SYNTHROID) 88 MCG tablet Take 1 tablet (88 mcg total) by mouth daily before breakfast. 90 tablet 3  ? NONFORMULARY OR COMPOUNDED ITEM Compression socks  20-30 mm/ hg   Dx low ext edema 1 each 1  ? oxyCODONE-acetaminophen (PERCOCET) 10-325 MG tablet Take 1 tablet by mouth every 4 (four) hours as needed for pain.     ? potassium chloride SA (KLOR-CON) 20 MEQ tablet Take 2 tablets (40 mEq total) by mouth daily. 180 tablet 3  ? simvastatin (ZOCOR) 20 MG tablet TAKE 1 TABLET BY MOUTH AT  BEDTIME 90 tablet 1  ? spironolactone (ALDACTONE) 25 MG tablet Take 1/2 (one-half) tablet by mouth once daily 45 tablet 3  ? traZODone (DESYREL) 50 MG tablet TAKE 1 TABLET BY MOUTH AT BEDTIME 90 tablet 0  ? triamcinolone cream (KENALOG) 0.1 % Apply topically.    ? ?No facility-administered medications prior to visit.  ? ? ? ? ?   ?Objective:  ? Physical Exam ? ?Vitals:  ? 11/07/21 1357  ?BP: 136/74  ?Pulse: 70  ?Temp: 98 ?F (36.7 ?C)  ?TempSrc: Oral  ?SpO2: 96%  ?Weight: 167 lb 9.6 oz (76 kg)  ?Height: 5' (1.524 m)  ? ?Gen: Pleasant, obese woman, in no distress,  normal affect ? ?ENT: No lesions,  mouth clear,  oropharynx clear, no  postnasal drip ? ?Neck: No JVD, no stridor ? ?Lungs: No use of accessory muscles, distant, no crackles or wheezing on normal respiration, soft end expiratory wheeze on forced expiration ? ?Cardiovascular: RRR,

## 2021-11-07 NOTE — Assessment & Plan Note (Signed)
Noted to have exertional hypoxemia when she was seen by Dr. Etter Sjogren, started on oxygen.  She is been wearing this at night and at home.  Did not know how to use the POC, does not sound like it was explained to her by her DME.  We will confirm hypoxemia with exertion today, determine whether she needs it, at what dose.  We will also perform an overnight oximetry on room air. ?

## 2021-11-09 DIAGNOSIS — G894 Chronic pain syndrome: Secondary | ICD-10-CM | POA: Diagnosis not present

## 2021-11-09 DIAGNOSIS — M961 Postlaminectomy syndrome, not elsewhere classified: Secondary | ICD-10-CM | POA: Diagnosis not present

## 2021-11-09 DIAGNOSIS — Z79891 Long term (current) use of opiate analgesic: Secondary | ICD-10-CM | POA: Diagnosis not present

## 2021-11-09 DIAGNOSIS — M62830 Muscle spasm of back: Secondary | ICD-10-CM | POA: Diagnosis not present

## 2021-11-13 ENCOUNTER — Telehealth: Payer: Self-pay | Admitting: Family Medicine

## 2021-11-13 DIAGNOSIS — E876 Hypokalemia: Secondary | ICD-10-CM

## 2021-11-13 MED ORDER — POTASSIUM CHLORIDE CRYS ER 20 MEQ PO TBCR: 60.0000 meq | EXTENDED_RELEASE_TABLET | Freq: Every day | ORAL | 3 refills | Status: DC

## 2021-11-13 NOTE — Telephone Encounter (Signed)
Reviewed patient's recent labs and per [place] okay to increase medication. Refill sent ?

## 2021-11-13 NOTE — Telephone Encounter (Signed)
Patient states Dr. Etter Sjogren changed her potassium from 2 pills a day to 3 a day and she needs an rx sent to pharmacy today because she is out. She also states it needs to be sent to her pharmacy for 3 pills a day in order for ins to cover it. Please advise.  ? ? ?[place] [place] [address]  ?[address] [place], [place] [address]  ?Phone:  [phone]  Fax:  [phone]  ?

## 2021-11-15 NOTE — Progress Notes (Signed)
? ?Subjective:  ? Joan [name] is a 76 y.o. female who presents for Medicare Annual (Subsequent) preventive examination. ? ?Review of Systems    ? ?Cardiac Risk Factors include: advanced age (>77mn, >>32women);hypertension;dyslipidemia ? ?   ?Objective:  ?  ?Today's Vitals  ? 11/16/21 1427  ?BP: 120/70  ?Pulse: 78  ?Resp: 18  ?Temp: 98.1 ?F (36.7 ?C)  ?Weight: 165 lb 12.8 oz (75.2 kg)  ?Height: 5' (1.524 m)  ?PainSc: 5   ? ?Body mass index is 32.38 kg/m?. ? ? ?  11/16/2021  ?  2:27 PM 11/10/2020  ?  2:37 PM 10/12/2019  ?  1:50 PM 09/09/2019  ? 12:00 PM 09/09/2019  ?  7:14 AM 09/02/2019  ?  1:12 PM 04/27/2019  ?  6:10 PM  ?Advanced Directives  ?Does Patient Have a Medical Advance Directive? No No No No No No No  ?Would patient like information on creating a medical advance directive? No - Patient declined No - Patient declined No - Patient declined No - Patient declined No - Patient declined No - Patient declined No - Patient declined  ? ? ?Current Medications (verified) ?Outpatient Encounter Medications as of 11/16/2021  ?Medication Sig  ? Acetaminophen (TYLENOL 8 HOUR ARTHRITIS PAIN PO) Take by mouth.  ? albuterol (VENTOLIN HFA) 108 (90 Base) MCG/ACT inhaler Inhale 2 puffs into the lungs every 6 (six) hours as needed for wheezing or shortness of breath.  ? bisacodyl (DULCOLAX) 5 MG EC tablet Take 5-10 mg by mouth daily as needed for mild constipation.   ? carisoprodol (SOMA) 350 MG tablet Take 175 mg by mouth 3 (three) times daily.   ? dorzolamide-timolol (COSOPT) 22.3-6.8 MG/ML ophthalmic solution Place 1 drop into the left eye 2 (two) times daily.  ? estradiol (ESTRACE) 0.5 MG tablet Take 0.5 mg by mouth at bedtime.   ? fluocinonide ointment (LIDEX) 05.91% Apply 1 application topically 2 (two) times daily.  ? fluticasone (FLONASE) 50 MCG/ACT nasal spray Place 2 sprays into both nostrils daily.  ? Fluticasone-Umeclidin-Vilant (TRELEGY ELLIPTA) 100-62.5-25 MCG/ACT AEPB Inhale 1 puff into the lungs daily.  ? furosemide  (LASIX) 40 MG tablet Take 2 tablets (80 mg total) by mouth daily.  ? hydrochlorothiazide (HYDRODIURIL) 25 MG tablet TAKE 1 TABLET BY MOUTH  DAILY  ? levothyroxine (SYNTHROID) 88 MCG tablet Take 1 tablet (88 mcg total) by mouth daily before breakfast.  ? NONFORMULARY OR COMPOUNDED ITEM Compression socks  20-30 mm/ hg   Dx low ext edema  ? oxyCODONE-acetaminophen (PERCOCET) 10-325 MG tablet Take 1 tablet by mouth every 4 (four) hours as needed for pain.   ? potassium chloride SA (KLOR-CON M) 20 MEQ tablet Take 3 tablets (60 mEq total) by mouth daily.  ? simvastatin (ZOCOR) 20 MG tablet TAKE 1 TABLET BY MOUTH AT  BEDTIME  ? spironolactone (ALDACTONE) 25 MG tablet Take 1/2 (one-half) tablet by mouth once daily  ? traZODone (DESYREL) 50 MG tablet TAKE 1 TABLET BY MOUTH AT BEDTIME  ? triamcinolone cream (KENALOG) 0.1 % Apply topically.  ? [DISCONTINUED] spironolactone (ALDACTONE) 25 MG tablet Take 1/2 (one-half) tablet by mouth once daily  ? ?No facility-administered encounter medications on file as of 11/16/2021.  ? ? ?Allergies (verified) ?Morphine and related, 5-alpha reductase inhibitors, Amitriptyline, Augmentin [amoxicillin-pot clavulanate], Gabapentin, and Triamterene  ? ?History: ?Past Medical History:  ?Diagnosis Date  ? Arthritis   ? "shoulders; wrist; probably spine" (06/24/2013)  ? Blood transfusion without reported diagnosis   ? Cancer (  [place])   ? skin cancer right leg x 4    ? Cataract   ? removed both eyes   ? Chronic low back pain   ? followed by Dr Hardin Negus pain mgt  ? Colon polyp   ? Constipation   ? COPD (chronic obstructive pulmonary disease) ([place])   ? mild  ? Esophageal stricture   ? Finger pain, left   ? 2 fingers on left hand since wrist surgery  ? GERD (gastroesophageal reflux disease)   ? Glaucoma   ? Heart murmur   ? "slight; not on RX" (06/24/2013)  ? History of cardiac arrhythmia   ? cardiologist- Traci Turner  ? Hx of colonic polyps 08/28/2004  ? Hyperlipemia   ? Hypertension   ? Hypothyroidism    ? Osteoarthritis of left knee   ? advanced  ? PONV (postoperative nausea and vomiting)   ? Pt reports symptoms are the result of gallbladder and cholecystectomy, not anesthesia  ? PVC's (premature ventricular contractions)   ? Sleep apnea 1990's  ? "tested; tried mask; couldn't stand it; told me as long as I slept on my side I'd be ok" (06/24/2013)  ? Thyroid nodule   ? Tobacco abuse   ? ?Past Surgical History:  ?Procedure Laterality Date  ? ABDOMINAL HYSTERECTOMY  1988  ? "partial" (06/24/2013)  ? ABDOMINAL HYSTERECTOMY    ? partial in 1988  ? ANKLE SURGERY Left 1995  ? "tendon repair" (06/24/2013)  ? BACK SURGERY    ? "think today was my 8th back OR" (06/24/2013)  ? CERVICAL SPINE SURGERY  2012  ? [place]  ? CHOLECYSTECTOMY N/A 04/16/2013  ? Procedure: LAPAROSCOPIC CHOLECYSTECTOMY ;  Surgeon: Imogene Burn. Georgette Dover, MD;  Location: [facility];  Service: General;  Laterality: N/A;  ? COLONOSCOPY    ? ELBOW SURGERY  1990's  ? FOOT SURGERY Right 2012  ? SPUR REMOVED  ? INFUSION PUMP IMPLANTATION  1990's  ? "implantablet morphine pump; took it out w/in 11 months  ? KNEE ARTHROSCOPY Left 1991; ~ 1993  ? LAPAROSCOPIC LYSIS OF ADHESIONS N/A 04/16/2013  ? Procedure: LAPAROSCOPIC LYSIS OF ADHESIONS;  Surgeon: Imogene Burn. Georgette Dover, MD;  Location: [facility];  Service: General;  Laterality: N/A;  ? LUMBAR FUSION    ? and rods  ? LUMBAR LAMINECTOMY/DECOMPRESSION MICRODISCECTOMY N/A 11/28/2012  ? Procedure: DECOMPRESSIVE LUMBAR LAMINECTOMY LEVEL 1;  Surgeon: Elaina Hoops, MD;  Location: [facility];  Service: Neurosurgery;  Laterality: N/A;  DECOMPRESSIVE LUMBAR LAMINECTOMY LEVEL 1  ? ORIF DISTAL RADIUS FRACTURE Left 12/30/2013  ? dr Caralyn Guile  ? ORIF WRIST FRACTURE Left 12/30/2013  ? Procedure: OPEN REDUCTION INTERNAL FIXATION (ORIF) LEFT WRIST FRACTURE AND REPAIR AS INDICATED;  Surgeon: Linna Hoff, MD;  Location: [facility];  Service: Orthopedics;  Laterality: Left;  ? POLYPECTOMY    ? POSTERIOR FUSION LUMBAR SPINE  06/24/2013  ?  ROBOTIC ASSISTED SALPINGO OOPHERECTOMY Bilateral 08/20/2014  ? Procedure: ROBOTIC ASSISTED SALPINGO OOPHORECTOMY;  Surgeon: Daria Pastures, MD;  Location: [facility];  Service: Gynecology;  Laterality: Bilateral;  ? SHOULDER ARTHROSCOPY Left 09/2011  ? THYROIDECTOMY  2012  ? TOTAL HIP ARTHROPLASTY Left 04/27/2019  ? Procedure: TOTAL HIP ARTHROPLASTY ANTERIOR APPROACH;  Surgeon: Gaynelle Arabian, MD;  Location: [facility];  Service: Orthopedics;  Laterality: Left;  113mn  ? TOTAL HIP ARTHROPLASTY Right 09/09/2019  ? Procedure: TOTAL HIP ARTHROPLASTY ANTERIOR APPROACH;  Surgeon: AGaynelle Arabian MD;  Location: [facility];  Service: Orthopedics;  Laterality:  Right;  187mn  ? TOTAL KNEE ARTHROPLASTY Right 09/09/2017  ? Procedure: RIGHT TOTAL KNEE ARTHROPLASTY;  Surgeon: AGaynelle Arabian MD;  Location: [facility];  Service: Orthopedics;  Laterality: Right;  ? UPPER GASTROINTESTINAL ENDOSCOPY    ? VITRECTOMY Right 10/18/2020  ? ?Family History  ?Problem Relation Age of Onset  ? Pancreatic cancer Father   ? Cancer Father   ? Diabetes type II Mother   ? Hypertension Mother   ? Coronary artery disease Mother [phone] ? Diabetes Mother   ? Thyroid cancer Mother   ? Skin cancer Mother   ? Diabetes Sister   ? Hypertension Sister   ? Bone cancer Sister   ? Breast cancer Sister   ? Hypertension Brother   ? Breast cancer Maternal Aunt   ? Other Neg Hx   ?     No family history of  colon cancer  ? Colon cancer Neg Hx   ? Colon polyps Neg Hx   ? Esophageal cancer Neg Hx   ? Stomach cancer Neg Hx   ? Rectal cancer Neg Hx   ? ?Social History  ? ?Socioeconomic History  ? Marital status: Divorced  ?  Spouse name: Not on file  ? Number of children: Not on file  ? Years of education: Not on file  ? Highest education level: Not on file  ?Occupational History  ? Not on file  ?Tobacco Use  ? Smoking status: Every Day  ?  Packs/day: 1.50  ?  Years: 50.00  ?  Pack years: 75.00  ?  Types: Cigarettes  ? Smokeless tobacco: Never  ? Tobacco comments:  ?  10 cigarettes  smoked daily ARJ 11/07/21  ?Vaping Use  ? Vaping Use: Never used  ?Substance and Sexual Activity  ? Alcohol use: No  ?  Alcohol/week: 0.0 standard drinks  ? Drug use: No  ? Sexual activity: Not Currently  ?Other

## 2021-11-16 ENCOUNTER — Ambulatory Visit (INDEPENDENT_AMBULATORY_CARE_PROVIDER_SITE_OTHER): Payer: Medicare Other | Admitting: Family Medicine

## 2021-11-16 ENCOUNTER — Ambulatory Visit: Payer: Medicare Other

## 2021-11-16 ENCOUNTER — Telehealth: Payer: Self-pay

## 2021-11-16 ENCOUNTER — Other Ambulatory Visit: Payer: Self-pay | Admitting: Family Medicine

## 2021-11-16 ENCOUNTER — Encounter: Payer: Self-pay | Admitting: Family Medicine

## 2021-11-16 ENCOUNTER — Ambulatory Visit: Payer: Medicare Other | Attending: Internal Medicine

## 2021-11-16 ENCOUNTER — Ambulatory Visit (INDEPENDENT_AMBULATORY_CARE_PROVIDER_SITE_OTHER): Payer: Medicare Other

## 2021-11-16 VITALS — BP 120/70 | HR 78 | Temp 98.1°F | Resp 18 | Ht 60.0 in | Wt 165.8 lb

## 2021-11-16 DIAGNOSIS — I10 Essential (primary) hypertension: Secondary | ICD-10-CM

## 2021-11-16 DIAGNOSIS — Z0000 Encounter for general adult medical examination without abnormal findings: Secondary | ICD-10-CM | POA: Diagnosis not present

## 2021-11-16 DIAGNOSIS — E785 Hyperlipidemia, unspecified: Secondary | ICD-10-CM | POA: Diagnosis not present

## 2021-11-16 DIAGNOSIS — F17210 Nicotine dependence, cigarettes, uncomplicated: Secondary | ICD-10-CM

## 2021-11-16 DIAGNOSIS — Z23 Encounter for immunization: Secondary | ICD-10-CM

## 2021-11-16 DIAGNOSIS — E039 Hypothyroidism, unspecified: Secondary | ICD-10-CM

## 2021-11-16 DIAGNOSIS — J449 Chronic obstructive pulmonary disease, unspecified: Secondary | ICD-10-CM

## 2021-11-16 MED ORDER — SPIRONOLACTONE 25 MG PO TABS: ORAL_TABLET | ORAL | 3 refills | Status: DC

## 2021-11-16 MED ORDER — OMEPRAZOLE 20 MG PO CPDR: DELAYED_RELEASE_CAPSULE | ORAL | 1 refills | Status: DC

## 2021-11-16 NOTE — Assessment & Plan Note (Signed)
Encourage heart healthy diet such as MIND or DASH diet, increase exercise, avoid trans fats, simple carbohydrates and processed foods, consider a krill or fish or flaxseed oil cap daily.  °

## 2021-11-16 NOTE — Telephone Encounter (Signed)
Pt would like an acid reflux medication sent into her pharmacy. ?

## 2021-11-16 NOTE — Progress Notes (Signed)
? ?Subjective:  ? ?By signing my name below, I, Joan [name], attest that this documentation has been prepared under the direction and in the presence of [name], DO. 11/16/2021 ? ? ? Patient ID: Joan [name], female    DOB: June 10, 1946, 76 y.o.   MRN: [id number] ? ?Chief Complaint  ?Patient presents with  ? Annual Exam  ? ? ?HPI ?Patient is in today for a comprehensive physical exam.  ? ?Her blood pressure is doing well during this visit. She is requesting a refill on 25 mg spironolactone daily PO. She continues taking 40 mg lasix, 25 mg hydrochlorothiazide and reports no new issues while taking them.  ?BP Readings from Last 3 Encounters:  ?11/16/21 120/70  ?11/16/21 120/70  ?11/07/21 136/74  ? ?Pulse Readings from Last 3 Encounters:  ?11/16/21 78  ?11/16/21 78  ?11/07/21 70  ? ?She is no longer receiving bone density scans due to only having on eligible area left to scan.  ?She reports her supplemental oxygen machine stopped working today. She finds that her machine works for 15 minutes after she turns it on then stops and gives her an error message. She has tried to contact the company of her machine for an unrelated reason but found they did not respond to her.  ?She seen her pulmonologist on 11/09/2021 and she completed a walking O2 test. She reports her lowest measurement without supplemental oxygen was 91%. She also reports having an upcomming appointment with a sleep specialist.  ?She reports feeling dehydrated. She thinks it's due to not drinking enough water daily.  ?She reports no episodes of SOB since her last visit. She continues using her trelegy ellipta inhaler if her symptoms worsen.  ? ?She continues taking 8 hour tylenol PO when her arthritis pain worsen and reports no new issues while taking it.  ?She denies having any fever, new muscle pain, new joint pain, new moles, congestion, sinus pain, sore throat, chest pain, palpations, cough, SOB, wheezing, n/v/d, constipation, blood in  stool, dysuria, frequency, hematuria, or headaches at this time. ?She is interested in receiving the bivalent Covid-19 vaccine and is planning on relieving it at her pharmacy. She reports her last Covid-19 vaccine was on 05/31/2020. She is due for a pneumonia vaccine and is planning on receiving it at a later date after her bivalent Covid-19 vaccine.  ? ? ?Past Medical History:  ?Diagnosis Date  ? Arthritis   ? "shoulders; wrist; probably spine" (06/24/2013)  ? Blood transfusion without reported diagnosis   ? Cancer [facility])   ? skin cancer right leg x 4    ? Cataract   ? removed both eyes   ? Chronic low back pain   ? followed by Dr Hardin Negus pain mgt  ? Colon polyp   ? Constipation   ? COPD (chronic obstructive pulmonary disease) ([place])   ? mild  ? Esophageal stricture   ? Finger pain, left   ? 2 fingers on left hand since wrist surgery  ? GERD (gastroesophageal reflux disease)   ? Glaucoma   ? Heart murmur   ? "slight; not on RX" (06/24/2013)  ? History of cardiac arrhythmia   ? cardiologist- Traci Turner  ? Hx of colonic polyps 08/28/2004  ? Hyperlipemia   ? Hypertension   ? Hypothyroidism   ? Osteoarthritis of left knee   ? advanced  ? PONV (postoperative nausea and vomiting)   ? Pt reports symptoms are the result of gallbladder and cholecystectomy, not  anesthesia  ? PVC's (premature ventricular contractions)   ? Sleep apnea 1990's  ? "tested; tried mask; couldn't stand it; told me as long as I slept on my side I'd be ok" (06/24/2013)  ? Thyroid nodule   ? Tobacco abuse   ? ? ?Past Surgical History:  ?Procedure Laterality Date  ? ABDOMINAL HYSTERECTOMY  1988  ? "partial" (06/24/2013)  ? ABDOMINAL HYSTERECTOMY    ? partial in 1988  ? ANKLE SURGERY Left 1995  ? "tendon repair" (06/24/2013)  ? BACK SURGERY    ? "think today was my 8th back OR" (06/24/2013)  ? CERVICAL SPINE SURGERY  2012  ? [place]  ? CHOLECYSTECTOMY N/A 04/16/2013  ? Procedure: LAPAROSCOPIC CHOLECYSTECTOMY ;  Surgeon: Imogene Burn. Georgette Dover,  MD;  Location: [facility];  Service: General;  Laterality: N/A;  ? COLONOSCOPY    ? ELBOW SURGERY  1990's  ? FOOT SURGERY Right 2012  ? SPUR REMOVED  ? INFUSION PUMP IMPLANTATION  1990's  ? "implantablet morphine pump; took it out w/in 11 months  ? KNEE ARTHROSCOPY Left 1991; ~ 1993  ? LAPAROSCOPIC LYSIS OF ADHESIONS N/A 04/16/2013  ? Procedure: LAPAROSCOPIC LYSIS OF ADHESIONS;  Surgeon: Imogene Burn. Georgette Dover, MD;  Location: [facility];  Service: General;  Laterality: N/A;  ? LUMBAR FUSION    ? and rods  ? LUMBAR LAMINECTOMY/DECOMPRESSION MICRODISCECTOMY N/A 11/28/2012  ? Procedure: DECOMPRESSIVE LUMBAR LAMINECTOMY LEVEL 1;  Surgeon: Elaina Hoops, MD;  Location: [facility];  Service: Neurosurgery;  Laterality: N/A;  DECOMPRESSIVE LUMBAR LAMINECTOMY LEVEL 1  ? ORIF DISTAL RADIUS FRACTURE Left 12/30/2013  ? dr Caralyn Guile  ? ORIF WRIST FRACTURE Left 12/30/2013  ? Procedure: OPEN REDUCTION INTERNAL FIXATION (ORIF) LEFT WRIST FRACTURE AND REPAIR AS INDICATED;  Surgeon: Linna Hoff, MD;  Location: [facility];  Service: Orthopedics;  Laterality: Left;  ? POLYPECTOMY    ? POSTERIOR FUSION LUMBAR SPINE  06/24/2013  ? ROBOTIC ASSISTED SALPINGO OOPHERECTOMY Bilateral 08/20/2014  ? Procedure: ROBOTIC ASSISTED SALPINGO OOPHORECTOMY;  Surgeon: Daria Pastures, MD;  Location: [facility];  Service: Gynecology;  Laterality: Bilateral;  ? SHOULDER ARTHROSCOPY Left 09/2011  ? THYROIDECTOMY  2012  ? TOTAL HIP ARTHROPLASTY Left 04/27/2019  ? Procedure: TOTAL HIP ARTHROPLASTY ANTERIOR APPROACH;  Surgeon: Gaynelle Arabian, MD;  Location: [facility];  Service: Orthopedics;  Laterality: Left;  152mn  ? TOTAL HIP ARTHROPLASTY Right 09/09/2019  ? Procedure: TOTAL HIP ARTHROPLASTY ANTERIOR APPROACH;  Surgeon: AGaynelle Arabian MD;  [facility];  Service: Orthopedics;  Laterality: Right;  1073m  ? TOTAL KNEE ARTHROPLASTY Right 09/09/2017  ? Procedure: RIGHT TOTAL KNEE ARTHROPLASTY;  Surgeon: AlGaynelle ArabianMD;  [facility];  Service: Orthopedics;  Laterality: Right;   ? UPPER GASTROINTESTINAL ENDOSCOPY    ? VITRECTOMY Right 10/18/2020  ? ? ?Family History  ?Problem Relation Age of Onset  ? Pancreatic cancer Father   ? Cancer Father   ? Diabetes type II Mother   ? Hypertension Mother   ? Coronary artery disease Mother [id number]? Diabetes Mother   ? Thyroid cancer Mother   ? Skin cancer Mother   ? Diabetes Sister   ? Hypertension Sister   ? Bone cancer Sister   ? Breast cancer Sister   ? Hypertension Brother   ? Breast cancer Maternal Aunt   ? Other Neg Hx   ?     No family history of  colon cancer  ? Colon cancer Neg Hx   ?  Colon polyps Neg Hx   ? Esophageal cancer Neg Hx   ? Stomach cancer Neg Hx   ? Rectal cancer Neg Hx   ? ? ?Social History  ? ?Socioeconomic History  ? Marital status: Divorced  ?  Spouse name: Not on file  ? Number of children: Not on file  ? Years of education: Not on file  ? Highest education level: Not on file  ?Occupational History  ? Not on file  ?Tobacco Use  ? Smoking status: Every Day  ?  Packs/day: 1.50  ?  Years: 50.00  ?  Pack years: 75.00  ?  Types: Cigarettes  ? Smokeless tobacco: Never  ? Tobacco comments:  ?  10 cigarettes smoked daily [facility] 11/07/21  ?Vaping Use  ? Vaping Use: Never used  ?Substance and Sexual Activity  ? Alcohol use: No  ?  Alcohol/week: 0.0 standard drinks  ? Drug use: No  ? Sexual activity: Not Currently  ?Other Topics Concern  ? Not on file  ?Social History Narrative  ? Divorced  ? Current Smoker  1 ppd -  20 yrs     ? Alcohol use-no    ?   [employer] - laid off   ?   ? Physician roster:  ? Dr. Elta Guadeloupe Philips - pain management  ? Dr. Philis Pique - GYN  ? Dr. Saintclair Halsted - Neurosurgery  ? Dr. Ronnald Ramp - dermatology  ? Dr. Caralyn Guile - orthopedics  ? Dr. Fransico Him - cardiology  ? Dr. Miller-ophthalmology  ? ?Social Determinants of Health  ? ?Financial Resource Strain: Low Risk   ? Difficulty of Paying Living Expenses: Not hard at all  ?Food Insecurity: No Food Insecurity  ? Worried About Charity fundraiser in the Last Year: Never  true  ? Ran Out of Food in the Last Year: Never true  ?Transportation Needs: No Transportation Needs  ? Lack of Transportation (Medical): No  ? Lack of Transportation (Non-Medical): No  ?Physical Activity

## 2021-11-16 NOTE — Assessment & Plan Note (Signed)
Well controlled, no changes to meds. Encouraged heart healthy diet such as the DASH diet and exercise as tolerated.  °

## 2021-11-16 NOTE — Patient Instructions (Signed)
Ms. Cordoba , ?Thank you for taking time to come for your Medicare Wellness Visit. I appreciate your ongoing commitment to your health goals. Please review the following plan we discussed and let me know if I can assist you in the future.  ? ?Screening recommendations/referrals: ?Colonoscopy: 12/02/20 due 12/02/25 ?Mammogram: 01/12/21 due 01/12/22 ?Bone Density: declined ?Recommended yearly ophthalmology/optometry visit for glaucoma screening and checkup ?Recommended yearly dental visit for hygiene and checkup ? ?Vaccinations: ?Influenza vaccine: up to date ?Pneumococcal vaccine: up to date ?Tdap vaccine: up to date ?Shingles vaccine: Due-May obtain vaccine at your local pharmacy.    ?Covid-19:Due-May obtain vaccine at  your local pharmacy.  ? ?Advanced directives: no ? ?Conditions/risks identified: see problem list ? ?Next appointment: Follow up in one year for your annual wellness visit  ? ? ?Preventive Care 48 Years and Older, Female ?Preventive care refers to lifestyle choices and visits with your health care provider that can promote health and wellness. ?What does preventive care include? ?A yearly physical exam. This is also called an annual well check. ?Dental exams once or twice a year. ?Routine eye exams. Ask your health care provider how often you should have your eyes checked. ?Personal lifestyle choices, including: ?Daily care of your teeth and gums. ?Regular physical activity. ?Eating a healthy diet. ?Avoiding tobacco and drug use. ?Limiting alcohol use. ?Practicing safe sex. ?Taking low-dose aspirin every day. ?Taking vitamin and mineral supplements as recommended by your health care provider. ?What happens during an annual well check? ?The services and screenings done by your health care provider during your annual well check will depend on your age, overall health, lifestyle risk factors, and family history of disease. ?Counseling  ?Your health care provider may ask you questions about your: ?Alcohol  use. ?Tobacco use. ?Drug use. ?Emotional well-being. ?Home and relationship well-being. ?Sexual activity. ?Eating habits. ?History of falls. ?Memory and ability to understand (cognition). ?Work and work Statistician. ?Reproductive health. ?Screening  ?You may have the following tests or measurements: ?Height, weight, and BMI. ?Blood pressure. ?Lipid and cholesterol levels. These may be checked every 5 years, or more frequently if you are over 34 years old. ?Skin check. ?Lung cancer screening. You may have this screening every year starting at age 2 if you have a 30-pack-year history of smoking and currently smoke or have quit within the past 15 years. ?Fecal occult blood test (FOBT) of the stool. You may have this test every year starting at age 81. ?Flexible sigmoidoscopy or colonoscopy. You may have a sigmoidoscopy every 5 years or a colonoscopy every 10 years starting at age 61. ?Hepatitis C blood test. ?Hepatitis B blood test. ?Sexually transmitted disease (STD) testing. ?Diabetes screening. This is done by checking your blood sugar (glucose) after you have not eaten for a while (fasting). You may have this done every 1-3 years. ?Bone density scan. This is done to screen for osteoporosis. You may have this done starting at age 92. ?Mammogram. This may be done every 1-2 years. Talk to your health care provider about how often you should have regular mammograms. ?Talk with your health care provider about your test results, treatment options, and if necessary, the need for more tests. ?Vaccines  ?Your health care provider may recommend certain vaccines, such as: ?Influenza vaccine. This is recommended every year. ?Tetanus, diphtheria, and acellular pertussis (Tdap, Td) vaccine. You may need a Td booster every 10 years. ?Zoster vaccine. You may need this after age 68. ?Pneumococcal 13-valent conjugate (PCV13) vaccine. One dose is  recommended after age 71. ?Pneumococcal polysaccharide (PPSV23) vaccine. One dose is  recommended after age 41. ?Talk to your health care provider about which screenings and vaccines you need and how often you need them. ?This information is not intended to replace advice given to you by your health care provider. Make sure you discuss any questions you have with your health care provider. ?Document Released: 07/22/2015 Document Revised: 03/14/2016 Document Reviewed: 04/26/2015 ?Elsevier Interactive Patient Education ? 2017 [place]. ? ?Fall Prevention in the Home ?Falls can cause injuries. They can happen to people of all ages. There are many things you can do to make your home safe and to help prevent falls. ?What can I do on the outside of my home? ?Regularly fix the edges of walkways and driveways and fix any cracks. ?Remove anything that might make you trip as you walk through a door, such as a raised step or threshold. ?Trim any bushes or trees on the path to your home. ?Use bright outdoor lighting. ?Clear any walking paths of anything that might make someone trip, such as rocks or tools. ?Regularly check to see if handrails are loose or broken. Make sure that both sides of any steps have handrails. ?Any raised decks and porches should have guardrails on the edges. ?Have any leaves, snow, or ice cleared regularly. ?Use sand or salt on walking paths during winter. ?Clean up any spills in your garage right away. This includes oil or grease spills. ?What can I do in the bathroom? ?Use night lights. ?Install grab bars by the toilet and in the tub and shower. Do not use towel bars as grab bars. ?Use non-skid mats or decals in the tub or shower. ?If you need to sit down in the shower, use a plastic, non-slip stool. ?Keep the floor dry. Clean up any water that spills on the floor as soon as it happens. ?Remove soap buildup in the tub or shower regularly. ?Attach bath mats securely with double-sided non-slip rug tape. ?Do not have throw rugs and other things on the floor that can make you  trip. ?What can I do in the bedroom? ?Use night lights. ?Make sure that you have a light by your bed that is easy to reach. ?Do not use any sheets or blankets that are too big for your bed. They should not hang down onto the floor. ?Have a firm chair that has side arms. You can use this for support while you get dressed. ?Do not have throw rugs and other things on the floor that can make you trip. ?What can I do in the kitchen? ?Clean up any spills right away. ?Avoid walking on wet floors. ?Keep items that you use a lot in easy-to-reach places. ?If you need to reach something above you, use a strong step stool that has a grab bar. ?Keep electrical cords out of the way. ?Do not use floor polish or wax that makes floors slippery. If you must use wax, use non-skid floor wax. ?Do not have throw rugs and other things on the floor that can make you trip. ?What can I do with my stairs? ?Do not leave any items on the stairs. ?Make sure that there are handrails on both sides of the stairs and use them. Fix handrails that are broken or loose. Make sure that handrails are as long as the stairways. ?Check any carpeting to make sure that it is firmly attached to the stairs. Fix any carpet that is loose or worn. ?Avoid having  throw rugs at the top or bottom of the stairs. If you do have throw rugs, attach them to the floor with carpet tape. ?Make sure that you have a light switch at the top of the stairs and the bottom of the stairs. If you do not have them, ask someone to add them for you. ?What else can I do to help prevent falls? ?Wear shoes that: ?Do not have high heels. ?Have rubber bottoms. ?Are comfortable and fit you well. ?Are closed at the toe. Do not wear sandals. ?If you use a stepladder: ?Make sure that it is fully opened. Do not climb a closed stepladder. ?Make sure that both sides of the stepladder are locked into place. ?Ask someone to hold it for you, if possible. ?Clearly mark and make sure that you can  see: ?Any grab bars or handrails. ?First and last steps. ?Where the edge of each step is. ?Use tools that help you move around (mobility aids) if they are needed. These include: ?Canes. ?Walkers. ?Scooters. ?Crutches. ?Turn

## 2021-11-16 NOTE — Assessment & Plan Note (Signed)
Pt is cutting down and hopes to completely quit in next few months ?

## 2021-11-16 NOTE — Assessment & Plan Note (Signed)
Check labs 

## 2021-11-16 NOTE — Patient Instructions (Signed)
Preventive Care 7 Years and Older, Female ?Preventive care refers to lifestyle choices and visits with your health care provider that can promote health and wellness. Preventive care visits are also called wellness exams. ?What can I expect for my preventive care visit? ?Counseling ?Your health care provider may ask you questions about your: ?Medical history, including: ?Past medical problems. ?Family medical history. ?Pregnancy and menstrual history. ?History of falls. ?Current health, including: ?Memory and ability to understand (cognition). ?Emotional well-being. ?Home life and relationship well-being. ?Sexual activity and sexual health. ?Lifestyle, including: ?Alcohol, nicotine or tobacco, and drug use. ?Access to firearms. ?Diet, exercise, and sleep habits. ?Work and work Statistician. ?Sunscreen use. ?Safety issues such as seatbelt and bike helmet use. ?Physical exam ?Your health care provider will check your: ?Height and weight. These may be used to calculate your BMI (body mass index). BMI is a measurement that tells if you are at a healthy weight. ?Waist circumference. This measures the distance around your waistline. This measurement also tells if you are at a healthy weight and may help predict your risk of certain diseases, such as type 2 diabetes and high blood pressure. ?Heart rate and blood pressure. ?Body temperature. ?Skin for abnormal spots. ?What immunizations do I need? ? ?Vaccines are usually given at various ages, according to a schedule. Your health care provider will recommend vaccines for you based on your age, medical history, and lifestyle or other factors, such as travel or where you work. ?What tests do I need? ?Screening ?Your health care provider may recommend screening tests for certain conditions. This may include: ?Lipid and cholesterol levels. ?Hepatitis C test. ?Hepatitis B test. ?HIV (human immunodeficiency virus) test. ?STI (sexually transmitted infection) testing, if you are at  risk. ?Lung cancer screening. ?Colorectal cancer screening. ?Diabetes screening. This is done by checking your blood sugar (glucose) after you have not eaten for a while (fasting). ?Mammogram. Talk with your health care provider about how often you should have regular mammograms. ?BRCA-related cancer screening. This may be done if you have a family history of breast, ovarian, tubal, or peritoneal cancers. ?Bone density scan. This is done to screen for osteoporosis. ?Talk with your health care provider about your test results, treatment options, and if necessary, the need for more tests. ?Follow these instructions at home: ?Eating and drinking ? ?Eat a diet that includes fresh fruits and vegetables, whole grains, lean protein, and low-fat dairy products. Limit your intake of foods with high amounts of sugar, saturated fats, and salt. ?Take vitamin and mineral supplements as recommended by your health care provider. ?Do not drink alcohol if your health care provider tells you not to drink. ?If you drink alcohol: ?Limit how much you have to 0-1 drink a day. ?Know how much alcohol is in your drink. In the [place] one drink equals one 12 oz bottle of beer (355 mL), one 5 oz glass of wine (148 mL), or one 1? oz glass of hard liquor (44 mL). ?Lifestyle ?Brush your teeth every morning and night with fluoride toothpaste. Floss one time each day. ?Exercise for at least 30 minutes 5 or more days each week. ?Do not use any products that contain nicotine or tobacco. These products include cigarettes, chewing tobacco, and vaping devices, such as e-cigarettes. If you need help quitting, ask your health care provider. ?Do not use drugs. ?If you are sexually active, practice safe sex. Use a condom or other form of protection in order to prevent STIs. ?Take aspirin only as told by  your health care provider. Make sure that you understand how much to take and what form to take. Work with your health care provider to find out whether it  is safe and beneficial for you to take aspirin daily. ?Ask your health care provider if you need to take a cholesterol-lowering medicine (statin). ?Find healthy ways to manage stress, such as: ?Meditation, yoga, or listening to music. ?Journaling. ?Talking to a trusted person. ?Spending time with friends and family. ?Minimize exposure to UV radiation to reduce your risk of skin cancer. ?Safety ?Always wear your seat belt while driving or riding in a vehicle. ?Do not drive: ?If you have been drinking alcohol. Do not ride with someone who has been drinking. ?When you are tired or distracted. ?While texting. ?If you have been using any mind-altering substances or drugs. ?Wear a helmet and other protective equipment during sports activities. ?If you have firearms in your house, make sure you follow all gun safety procedures. ?What's next? ?Visit your health care provider once a year for an annual wellness visit. ?Ask your health care provider how often you should have your eyes and teeth checked. ?Stay up to date on all vaccines. ?This information is not intended to replace advice given to you by your health care provider. Make sure you discuss any questions you have with your health care provider. ?Document Revised: 12/21/2020 Document Reviewed: 12/21/2020 ?Elsevier Patient Education ? [place]. ? ?

## 2021-11-16 NOTE — Progress Notes (Signed)
? ?  Covid-[facility] Vaccination [facility] ? ?Name:  Joan [name]    ?MRN: [id number] ?DOB: 12/16/1945 ? ?11/16/2021 ? ?[name] was observed post Covid-19 immunization for 15 minutes without incident. She was provided with Vaccine Information Sheet and instruction to access the V-Safe system.  ? ?[name] was instructed to call 911 with any severe reactions post vaccine: ?Difficulty breathing  ?Swelling of face and throat  ?A fast heartbeat  ?A bad rash all over body  ?Dizziness and weakness  ? ?Immunizations Administered   ? ? Name Date Dose VIS Date Route  ? Moderna Covid-19 vaccine Bivalent Booster 11/16/2021  2:56 PM 0.5 mL 02/18/2021 Intramuscular  ? Manufacturer: Moderna  ? Lot: [id number]  ? [place]: [id number]  ? ?  ? ? ?

## 2021-11-16 NOTE — Assessment & Plan Note (Signed)
Pt cutting down on smoking ?con't trelegy ?On o2 at home ?F/ui pulmonary ?

## 2021-11-16 NOTE — Assessment & Plan Note (Signed)
ghm utd Check labs  See avs  

## 2021-11-17 LAB — COMPREHENSIVE METABOLIC PANEL
ALT: 12 U/L (ref 0–35)
AST: 17 U/L (ref 0–37)
Albumin: 4.4 g/dL (ref 3.5–5.2)
Alkaline Phosphatase: 92 U/L (ref 39–117)
BUN: 16 mg/dL (ref 6–23)
CO2: 35 mEq/L — ABNORMAL HIGH (ref 19–32)
Calcium: 9 mg/dL (ref 8.4–10.5)
Chloride: 93 mEq/L — ABNORMAL LOW (ref 96–112)
Creatinine, Ser: 0.83 mg/dL (ref 0.40–1.20)
GFR: 68.19 mL/min (ref >60.00)
Glucose, Bld: 127 mg/dL — ABNORMAL HIGH (ref 70–99)
Potassium: 3.1 mEq/L — ABNORMAL LOW (ref 3.5–5.1)
Sodium: 138 mEq/L (ref 135–145)
Total Bilirubin: 0.5 mg/dL (ref 0.2–1.2)
Total Protein: 6.8 g/dL (ref 6.0–8.3)

## 2021-11-17 LAB — LIPID PANEL
Cholesterol: 180 mg/dL (ref 0–200)
HDL: 59.7 mg/dL (ref >39.00)
LDL Cholesterol: 84 mg/dL (ref 0–99)
NonHDL: 120.75
Total CHOL/HDL Ratio: 3
Triglycerides: 185 mg/dL — ABNORMAL HIGH (ref 0.0–149.0)
VLDL: 37 mg/dL (ref 0.0–40.0)

## 2021-11-17 LAB — CBC WITH DIFFERENTIAL/PLATELET
Basophils Absolute: 0.1 10*3/uL (ref 0.0–0.1)
Basophils Relative: 1 % (ref 0.0–3.0)
Eosinophils Absolute: 0.1 10*3/uL (ref 0.0–0.7)
Eosinophils Relative: 1.4 % (ref 0.0–5.0)
HCT: 38.3 % (ref 36.0–46.0)
Hemoglobin: 13.1 g/dL (ref 12.0–15.0)
Lymphocytes Relative: 23.7 % (ref 12.0–46.0)
Lymphs Abs: 1.5 10*3/uL (ref 0.7–4.0)
MCHC: 34.2 g/dL (ref 30.0–36.0)
MCV: 87.7 fl (ref 78.0–100.0)
Monocytes Absolute: 1 10*3/uL (ref 0.1–1.0)
Monocytes Relative: 16.2 % — ABNORMAL HIGH (ref 3.0–12.0)
Neutro Abs: 3.6 10*3/uL (ref 1.4–7.7)
Neutrophils Relative %: 57.7 % (ref 43.0–77.0)
Platelets: 213 10*3/uL (ref 150.0–400.0)
RBC: 4.36 Mil/uL (ref 3.87–5.11)
RDW: 13.8 % (ref 11.5–15.5)
WBC: 6.2 10*3/uL (ref 4.0–10.5)

## 2021-11-17 LAB — TSH: TSH: 1.35 u[IU]/mL (ref 0.35–5.50)

## 2021-11-20 ENCOUNTER — Other Ambulatory Visit (HOSPITAL_BASED_OUTPATIENT_CLINIC_OR_DEPARTMENT_OTHER): Payer: Self-pay

## 2021-11-20 ENCOUNTER — Other Ambulatory Visit: Payer: Self-pay | Admitting: Family Medicine

## 2021-11-20 DIAGNOSIS — E785 Hyperlipidemia, unspecified: Secondary | ICD-10-CM

## 2021-11-20 MED ORDER — MODERNA COVID-19 BIVAL BOOSTER 50 MCG/0.5ML IM SUSP
INTRAMUSCULAR | 0 refills | Status: DC
  Filled 2021-11-20: qty 0.5, 1d supply, fill #0

## 2021-11-21 ENCOUNTER — Other Ambulatory Visit: Payer: Self-pay

## 2021-11-23 ENCOUNTER — Ambulatory Visit (HOSPITAL_COMMUNITY)
Admission: RE | Admit: 2021-11-23 | Discharge: 2021-11-23 | Disposition: A | Discharge status: Home / Self Care | Payer: Medicare Other | Source: Ambulatory Visit | Attending: Emergency Medicine | Admitting: Emergency Medicine

## 2021-11-23 DIAGNOSIS — J449 Chronic obstructive pulmonary disease, unspecified: Secondary | ICD-10-CM | POA: Insufficient documentation

## 2021-11-23 DIAGNOSIS — R0902 Hypoxemia: Secondary | ICD-10-CM | POA: Diagnosis not present

## 2021-11-23 DIAGNOSIS — R0602 Shortness of breath: Secondary | ICD-10-CM | POA: Diagnosis not present

## 2021-11-23 DIAGNOSIS — J439 Emphysema, unspecified: Secondary | ICD-10-CM | POA: Diagnosis not present

## 2021-12-07 ENCOUNTER — Telehealth: Payer: Self-pay | Admitting: Emergency Medicine

## 2021-12-07 NOTE — Telephone Encounter (Signed)
Called and spoke to pt. Informed her of the super D CT results per Dr. Lamonte Sakai. Pt verbalized understanding. Pt also questioned what her results were from the ONO. Pt states she completed this on 5/23. Pt also questioning when she needs to return to office for ROV.    Joan Bamberg, do you have the ONO results? Dr. Lamonte Sakai, when would you like pt to have ROV? It was not mentioned at last OV. Thanks.

## 2021-12-08 NOTE — Telephone Encounter (Signed)
I think she will need to follow with me in 2-3 months.  I will have to find her ONO results when I am in office next week

## 2021-12-08 NOTE — Telephone Encounter (Signed)
Spoke with the pt and notified of response per Dr Lamonte Sakai and scheduled for rov with RB for 02/14/22   Estill Bamberg, please ensure the ONO is received and reviewed when Dr Lamonte Sakai in clinic wk of 12/11/21, thanks!

## 2021-12-14 NOTE — Telephone Encounter (Signed)
PCCs could please advise on the status of the ONO? The order is placed.

## 2021-12-14 NOTE — Telephone Encounter (Signed)
I just spoke with Colletta Maryland with [employer] and she stated that she can fax the ONO results attn Estill Bamberg

## 2021-12-24 DIAGNOSIS — J449 Chronic obstructive pulmonary disease, unspecified: Secondary | ICD-10-CM | POA: Diagnosis not present

## 2021-12-24 DIAGNOSIS — R0902 Hypoxemia: Secondary | ICD-10-CM | POA: Diagnosis not present

## 2022-01-01 DIAGNOSIS — H524 Presbyopia: Secondary | ICD-10-CM | POA: Diagnosis not present

## 2022-01-01 DIAGNOSIS — H52223 Regular astigmatism, bilateral: Secondary | ICD-10-CM | POA: Diagnosis not present

## 2022-01-01 DIAGNOSIS — H401121 Primary open-angle glaucoma, left eye, mild stage: Secondary | ICD-10-CM | POA: Diagnosis not present

## 2022-01-02 ENCOUNTER — Ambulatory Visit: Payer: Medicare Other | Admitting: Cardiovascular Disease

## 2022-01-02 NOTE — Progress Notes (Signed)
Cardiology Office Note:   Date:  01/03/2022  NAME:  Joan [name]    MRN: [id number] DOB:  Dec 21, 1945   PCP:  [name], DO  Cardiologist:  Evalina Field, MD  Electrophysiologist:  None   Referring MD: Carollee Herter, Alferd Apa, *   Chief Complaint  Patient presents with   Follow-up        History of Present Illness:   Joan [name] is a 76 y.o. female with a hx of moderate COPD, tobacco abuse, HTN, HLD who presents for follow-up.  Now currently on oxygen.  Recent chest CT with bronchiectasis as well as possible chronic pneumonia and changes concerning for Mycobacterium.  Apparently pulmonary told her there was nothing to worry about.  She is still having lower extremity edema.  Taking Lasix 40 mg twice daily.  She is on potassium supplementation.  We discussed increasing her Aldactone.  Still has changes consistent with venous insufficiency.  No signs of congestive heart failure.  We discussed elevating her legs is much as possible.  Her blood pressure is well controlled.  She is short of breath with activity.  Denies any chest pain or pressure.  Problem List 1. HTN 2. Tobacco abuse -50 pack year history  3. Venous insufficiency/venous dermatitis  -negative DVT study 03/2019 -BNP 13 -EF 55-60% no WMA on my review  4.  Hyperlipidemia -Total cholesterol 180, HDL 59, LDL 84, TG 185 5. COPD -moderate 6. DM -A1c 6.5  Past Medical History: Past Medical History:  Diagnosis Date   Arthritis    "shoulders; wrist; probably spine" (06/24/2013)   Blood transfusion without reported diagnosis    Cancer ([place])    skin cancer right leg x 4     Cataract    removed both eyes    Chronic low back pain    followed by Dr Hardin Negus pain mgt   Colon polyp    Constipation    COPD (chronic obstructive pulmonary disease) (HCC)    mild   Esophageal stricture    Finger pain, left    2 fingers on left hand since wrist surgery   GERD (gastroesophageal reflux disease)     Glaucoma    Heart murmur    "slight; not on RX" (06/24/2013)   History of cardiac arrhythmia    cardiologist- Traci Turner   Hx of colonic polyps 08/28/2004   Hyperlipemia    Hypertension    Hypothyroidism    Osteoarthritis of left knee    advanced   PONV (postoperative nausea and vomiting)    Pt reports symptoms are the result of gallbladder and cholecystectomy, not anesthesia   PVC's (premature ventricular contractions)    Sleep apnea 1990's   "tested; tried mask; couldn't stand it; told me as long as I slept on my side I'd be ok" (06/24/2013)   Thyroid nodule    Tobacco abuse     Past Surgical History: Past Surgical History:  Procedure Laterality Date   ABDOMINAL HYSTERECTOMY  1988   "partial" (06/24/2013)   ABDOMINAL HYSTERECTOMY     partial in [place]   "tendon repair" (06/24/2013)   [place]     "think today was my 8th back OR" (06/24/2013)   CERVICAL SPINE SURGERY  2012   CESAREAN SECTION  1972   CHOLECYSTECTOMY N/A 04/16/2013   Procedure: LAPAROSCOPIC CHOLECYSTECTOMY ;  Surgeon: Imogene Burn. Georgette Dover, MD;  Location: [facility];  Service: General;  Laterality: N/A;  COLONOSCOPY     ELBOW SURGERY  1990's   FOOT SURGERY Right 2012   SPUR REMOVED   INFUSION PUMP IMPLANTATION  1990's   "implantablet morphine pump; took it out w/in 11 months   KNEE ARTHROSCOPY Left 1991; ~ [place] N/A 04/16/2013   Procedure: LAPAROSCOPIC LYSIS OF ADHESIONS;  Surgeon: Imogene Burn. Georgette Dover, MD;  Location: [facility];  Service: General;  Laterality: N/A;   LUMBAR FUSION     and rods   LUMBAR LAMINECTOMY/DECOMPRESSION MICRODISCECTOMY N/A 11/28/2012   Procedure: DECOMPRESSIVE LUMBAR LAMINECTOMY LEVEL 1;  Surgeon: Elaina Hoops, MD;  Location: [facility];  Service: Neurosurgery;  Laterality: N/A;  DECOMPRESSIVE LUMBAR LAMINECTOMY LEVEL 1   ORIF DISTAL RADIUS FRACTURE Left 12/30/2013   dr Caralyn Guile   ORIF WRIST FRACTURE Left 12/30/2013   Procedure: OPEN  REDUCTION INTERNAL FIXATION (ORIF) LEFT WRIST FRACTURE AND REPAIR AS INDICATED;  Surgeon: Linna Hoff, MD;  Location: [facility];  Service: Orthopedics;  Laterality: Left;   POLYPECTOMY     POSTERIOR FUSION LUMBAR SPINE  06/24/2013   ROBOTIC ASSISTED SALPINGO OOPHERECTOMY Bilateral 08/20/2014   Procedure: ROBOTIC ASSISTED SALPINGO OOPHORECTOMY;  Surgeon: Daria Pastures, MD;  Location: [facility];  Service: Gynecology;  Laterality: Bilateral;   SHOULDER ARTHROSCOPY Left 09/2011   THYROIDECTOMY  2012   TOTAL HIP ARTHROPLASTY Left 04/27/2019   Procedure: TOTAL HIP ARTHROPLASTY ANTERIOR APPROACH;  Surgeon: Gaynelle Arabian, MD;  Location: [facility];  Service: Orthopedics;  Laterality: Left;  136mn   TOTAL HIP ARTHROPLASTY Right 09/09/2019   Procedure: TOTAL HIP ARTHROPLASTY ANTERIOR APPROACH;  Surgeon: AGaynelle Arabian MD;  Location: [facility];  Service: Orthopedics;  Laterality: Right;  1066m   TOTAL KNEE ARTHROPLASTY Right 09/09/2017   Procedure: RIGHT TOTAL KNEE ARTHROPLASTY;  Surgeon: AlGaynelle ArabianMD;  [facility]: [facility];  Service: Orthopedics;  Laterality: Right;   UPPER GASTROINTESTINAL ENDOSCOPY     VITRECTOMY Right 10/18/2020    Current Medications: Current Meds  Medication Sig   Acetaminophen (TYLENOL 8 HOUR ARTHRITIS PAIN PO) Take by mouth.   albuterol (VENTOLIN HFA) 108 (90 Base) MCG/ACT inhaler Inhale 2 puffs into the lungs every 6 (six) hours as needed for wheezing or shortness of breath.   bisacodyl (DULCOLAX) 5 MG EC tablet Take 5-10 mg by mouth daily as needed for mild constipation.    carisoprodol (SOMA) 350 MG tablet Take 175 mg by mouth 3 (three) times daily.    COVID-19 mRNA bivalent vaccine, Moderna, (MODERNA COVID-19 BIVAL BOOSTER) 50 MCG/0.5ML injection Inject into the muscle.   diclofenac Sodium (VOLTAREN) 1 % GEL as needed.   dorzolamide-timolol (COSOPT) 22.3-6.8 MG/ML ophthalmic solution Place 1 drop into the left eye 2 (two) times daily.   estradiol (ESTRACE) 0.5 MG tablet Take  0.5 mg by mouth at bedtime.    fluocinonide ointment (LIDEX) 0.2.77 Apply 1 application topically 2 (two) times daily.   fluticasone (FLONASE) 50 MCG/ACT nasal spray Place 2 sprays into both nostrils daily.   Fluticasone-Umeclidin-Vilant (TRELEGY ELLIPTA) 100-62.5-25 MCG/ACT AEPB Inhale 1 puff into the lungs daily.   furosemide (LASIX) 40 MG tablet Take 2 tablets (80 mg total) by mouth daily.   levothyroxine (SYNTHROID) 88 MCG tablet Take 1 tablet (88 mcg total) by mouth daily before breakfast.   NONFORMULARY OR COMPOUNDED ITEM Compression socks  20-30 mm/ hg   Dx low ext edema   omeprazole (PRILOSEC) 20 MG capsule Take 2 capsules by mouth once a day.   oxyCODONE-acetaminophen (  PERCOCET) 10-325 MG tablet Take 1 tablet by mouth every 4 (four) hours as needed for pain.    potassium chloride SA (KLOR-CON M) 20 MEQ tablet Take 3 tablets (60 mEq total) by mouth daily.   simvastatin (ZOCOR) 20 MG tablet TAKE 1 TABLET BY MOUTH AT  BEDTIME   spironolactone (ALDACTONE) 25 MG tablet Take 1 tablet (25 mg total) by mouth daily.   traZODone (DESYREL) 50 MG tablet TAKE 1 TABLET BY MOUTH AT BEDTIME   triamcinolone cream (KENALOG) 0.1 % Apply topically.   [DISCONTINUED] spironolactone (ALDACTONE) 25 MG tablet Take 1/2 (one-half) tablet by mouth once daily     Allergies:    Morphine and related, 5-alpha reductase inhibitors, Amitriptyline, Augmentin [amoxicillin-pot clavulanate], Gabapentin, and Triamterene   Social History: Social History   Socioeconomic History   Marital status: Divorced    Spouse name: Not on file   Number of children: Not on file   Years of education: Not on file   Highest education level: Not on file  Occupational History   Not on file  Tobacco Use   Smoking status: Every Day    Packs/day: 1.50    Years: 50.00    Total pack years: 75.00    Types: Cigarettes   Smokeless tobacco: Never   Tobacco comments:    10 cigarettes smoked daily ARJ 11/07/21  Vaping Use   Vaping Use:  Never used  Substance and Sexual Activity   Alcohol use: No    Alcohol/week: 0.0 standard drinks of alcohol   Drug use: No   Sexual activity: Not Currently  Other Topics Concern   Not on file  Social History Narrative   Divorced   Current Smoker  1 ppd -  20 yrs      Alcohol use-no       [employer] - laid off       Physician roster:   Dr. Elta Guadeloupe Philips - pain management   Dr. Philis Pique - GYN   Dr. Saintclair Halsted - Neurosurgery   Dr. Ronnald Ramp - dermatology   Dr. Caralyn Guile - orthopedics   Dr. Fransico Him - cardiology   Dr. Miller-ophthalmology   Social Determinants of Health   Financial Resource Strain: Low Risk  (11/16/2021)   Overall Financial Resource Strain (CARDIA)    Difficulty of Paying Living Expenses: Not hard at all  Food Insecurity: No Food Insecurity (11/16/2021)   Hunger Vital Sign    Worried About Running Out of Food in the Last Year: Never true    Ran Out of Food in the Last Year: Never true  Transportation Needs: No Transportation Needs (11/16/2021)   PRAPARE - Hydrologist (Medical): No    Lack of Transportation (Non-Medical): No  Physical Activity: Inactive (11/16/2021)   Exercise Vital Sign    Days of Exercise per Week: 0 days    Minutes of Exercise per Session: 0 min  Stress: No Stress Concern Present (11/16/2021)   [place]    Feeling of Stress : Not at all  Social Connections: Socially Isolated (11/16/2021)   Social Connection and Isolation Panel [NHANES]    Frequency of Communication with Friends and Family: Twice a week    Frequency of Social Gatherings with Friends and Family: Once a week    Attends Religious Services: Never    Marine scientist or Organizations: No    Attends Archivist Meetings: Never    Marital  Status: Divorced     Family History: The patient's family history includes Bone cancer in her sister; Breast cancer in her  maternal aunt and sister; Cancer in her father; Coronary artery disease (age of onset: 17) in her mother; Diabetes in her mother and sister; Diabetes type II in her mother; Hypertension in her brother, mother, and sister; Pancreatic cancer in her father; Skin cancer in her mother; Thyroid cancer in her mother. There is no history of Other, Colon cancer, Colon polyps, Esophageal cancer, Stomach cancer, or Rectal cancer.  ROS:   All other ROS reviewed and negative. Pertinent positives noted in the HPI.     EKGs/Labs/Other Studies Reviewed:   The following studies were personally reviewed by me today:  TTE 12/25/2019  1. MIld inferior basal hypokinesis Prominent epicaridal fat. Left  ventricular ejection fraction, by estimation, is 50 to 55%. The left  ventricle has low normal function. The left ventricle demonstrates  regional wall motion abnormalities (see scoring  diagram/findings for description). Left ventricular diastolic parameters  are consistent with Grade I diastolic dysfunction (impaired relaxation).   2. Right ventricular systolic function is normal. The right ventricular  size is normal.   3. The mitral valve is normal in structure. No evidence of mitral valve  regurgitation. No evidence of mitral stenosis.   4. The aortic valve was not well visualized. Aortic valve regurgitation  is not visualized. Mild aortic valve sclerosis is present, with no  evidence of aortic valve stenosis.   5. The inferior vena cava is normal in size with greater than 50%  respiratory variability, suggesting right atrial pressure of 3 mmHg.   Vasc US 04/27/2020  Summary:  Right:  - No evidence of deep vein thrombosis seen in the right lower extremity,  from the common femoral through the popliteal veins.  - No evidence of superficial venous thrombosis in the right lower  extremity.  - Deep vein reflux in the CFV, FV.  _ Superficial vein reflux in the GSV at the knee.     Left:  - No evidence of  deep vein thrombosis seen in the left lower extremity,  from the common femoral through the popliteal veins.  - No evidence of superficial venous thrombosis in the left lower  extremity.  - Deep vein reflux in the CFV.  - Superficial vein reflux in the SSV and in the GSV in the proximal calf.   Recent Labs: 10/19/2021: Pro B Natriuretic peptide (BNP) 23.0 11/16/2021: ALT 12; BUN 16; Creatinine, Ser 0.83; Hemoglobin 13.1; Platelets 213.0; Potassium 3.1; Sodium 138; TSH 1.35   Recent Lipid Panel    Component Value Date/Time   CHOL 180 11/16/2021 1355   TRIG 185.0 (H) 11/16/2021 1355   HDL 59.70 11/16/2021 1355   CHOLHDL 3 11/16/2021 1355   VLDL 37.0 11/16/2021 1355   LDLCALC 84 11/16/2021 1355   LDLCALC 89 03/17/2020 1416   LDLDIRECT 98.0 10/17/2021 1552    Physical Exam:   VS:  BP 132/70   Pulse (!) 59   Ht 5' (1.524 m)   Wt 175 lb 3.2 oz (79.5 kg)   SpO2 99%   BMI 34.22 kg/m    Wt Readings from Last 3 Encounters:  01/03/22 175 lb 3.2 oz (79.5 kg)  11/16/21 165 lb 12.8 oz (75.2 kg)  11/16/21 165 lb 12.8 oz (75.2 kg)    General: Well nourished, well developed, in no acute distress Head: Atraumatic, normal size  Eyes: PEERLA, EOMI  Neck: Supple, no JVD  Endocrine: No thryomegaly Cardiac: Normal S1, S2; RRR; no murmurs, rubs, or gallops Lungs: Diminished breath sounds bilaterally Abd: Soft, nontender, no hepatomegaly  Ext: 1+ pitting edema, chronic venous insufficiency changes noted Musculoskeletal: No deformities, BUE and BLE strength normal and equal Skin: Warm and dry, no rashes   Neuro: Alert and oriented to person, place, time, and situation, CNII-XII grossly intact, no focal deficits  Psych: Normal mood and affect   ASSESSMENT:   Joan [name] is a 76 y.o. female who presents for the following: 1. Venous insufficiency   2. Leg swelling   3. Shortness of breath   4. Essential hypertension   5. Tobacco abuse     PLAN:   1. Venous insufficiency 2. Leg  swelling -Related to venous insufficiency.  No signs of congestive heart failure.  Continue Lasix 40 mg twice daily.  Recheck potassium today.  We will increase her Aldactone to 25 mg daily to help with this as well.  We also discussed reducing her salt intake.  She is using salt on her meals.  3. Shortness of breath -Moderate COPD.  Chest CT with possible chronic pneumonia.  Follow pulmonary.  4. Essential hypertension -Well-controlled.  5. Tobacco abuse -Smoking cessation recommended.  Disposition: Return in about 6 months (around 07/05/2022).  Medication Adjustments/Labs and Tests Ordered: Current medicines are reviewed at length with the patient today.  Concerns regarding medicines are outlined above.  Orders Placed This Encounter  Procedures   Basic Metabolic Panel (BMET)   Ambulatory referral to Wound Clinic   Meds ordered this encounter  Medications   spironolactone (ALDACTONE) 25 MG tablet    Sig: Take 1 tablet (25 mg total) by mouth daily.    Dispense:  90 tablet    Refill:  3    Patient Instructions  Medication Instructions:  Your physician has recommended you make the following change in your medication:  INCREASE: Spirolactone 25 mg once daily *If you need a refill on your cardiac medications before your next appointment, please call your pharmacy*   Lab Work: Your physician recommends that you return for lab work in:  TODAY: BMET If you have labs (blood work) drawn today and your tests are completely normal, you will receive your results only by: [employer] (if you have MyChart) OR A paper copy in the mail If you have any lab test that is abnormal or we need to change your treatment, we will call you to review the results.   Testing/Procedures: None   Follow-Up: At [facility], you and your health needs are our priority.  As part of our continuing mission to provide you with exceptional heart care, we have created designated Provider Care Teams.   These Care Teams include your primary Cardiologist (physician) and Advanced Practice Providers (APPs -  Physician Assistants and Nurse Practitioners) who all work together to provide you with the care you need, when you need it.  We recommend signing up for the patient portal called "MyChart".  Sign up information is provided on this After Visit Summary.  MyChart is used to connect with patients for Virtual Visits (Telemedicine).  Patients are able to view lab/test results, encounter notes, upcoming appointments, etc.  Non-urgent messages can be sent to your provider as well.   To learn more about what you can do with MyChart, go to [email or web address].    Your next appointment:   6 month(s)  The format for your next appointment:   In Person  Provider:  Evalina Field, MD     Other Instructions    Important Information About Sugar         Time Spent with Patient: I have spent a total of 35 minutes with patient reviewing hospital notes, telemetry, EKGs, labs and examining the patient as well as establishing an assessment and plan that was discussed with the patient.  > 50% of time was spent in direct patient care.  Signed, Addison Naegeli. Audie Box, MD, [facility]  [address], [place], [place] [address] [phone]  01/03/2022 3:11 PM

## 2022-01-03 ENCOUNTER — Ambulatory Visit: Payer: Medicare Other | Admitting: Cardiovascular Disease

## 2022-01-03 ENCOUNTER — Encounter: Payer: Self-pay | Admitting: Cardiovascular Disease

## 2022-01-03 VITALS — BP 132/70 | HR 59 | Ht 60.0 in | Wt 175.2 lb

## 2022-01-03 DIAGNOSIS — R0602 Shortness of breath: Secondary | ICD-10-CM

## 2022-01-03 DIAGNOSIS — Z720 Tobacco use: Secondary | ICD-10-CM

## 2022-01-03 DIAGNOSIS — I10 Essential (primary) hypertension: Secondary | ICD-10-CM | POA: Diagnosis not present

## 2022-01-03 DIAGNOSIS — M7989 Other specified soft tissue disorders: Secondary | ICD-10-CM

## 2022-01-03 DIAGNOSIS — I872 Venous insufficiency (chronic) (peripheral): Secondary | ICD-10-CM | POA: Diagnosis not present

## 2022-01-03 MED ORDER — SPIRONOLACTONE 25 MG PO TABS: 25.0000 mg | ORAL_TABLET | Freq: Every day | ORAL | 3 refills | Status: DC

## 2022-01-03 NOTE — Patient Instructions (Signed)
Medication Instructions:  Your physician has recommended you make the following change in your medication:  INCREASE: Spirolactone 25 mg once daily *If you need a refill on your cardiac medications before your next appointment, please call your pharmacy*   Lab Work: Your physician recommends that you return for lab work in:  TODAY: BMET If you have labs (blood work) drawn today and your tests are completely normal, you will receive your results only by: [employer] (if you have MyChart) OR A paper copy in the mail If you have any lab test that is abnormal or we need to change your treatment, we will call you to review the results.   Testing/Procedures: None   Follow-Up: At [facility], you and your health needs are our priority.  As part of our continuing mission to provide you with exceptional heart care, we have created designated Provider Care Teams.  These Care Teams include your primary Cardiologist (physician) and Advanced Practice Providers (APPs -  Physician Assistants and Nurse Practitioners) who all work together to provide you with the care you need, when you need it.  We recommend signing up for the patient portal called "MyChart".  Sign up information is provided on this After Visit Summary.  MyChart is used to connect with patients for Virtual Visits (Telemedicine).  Patients are able to view lab/test results, encounter notes, upcoming appointments, etc.  Non-urgent messages can be sent to your provider as well.   To learn more about what you can do with MyChart, go to [email or web address].    Your next appointment:   6 month(s)  The format for your next appointment:   In Person  Provider:   Evalina Field, MD     Other Instructions    Important Information About Sugar

## 2022-01-04 DIAGNOSIS — Z79891 Long term (current) use of opiate analgesic: Secondary | ICD-10-CM | POA: Diagnosis not present

## 2022-01-04 DIAGNOSIS — M62830 Muscle spasm of back: Secondary | ICD-10-CM | POA: Diagnosis not present

## 2022-01-04 DIAGNOSIS — G894 Chronic pain syndrome: Secondary | ICD-10-CM | POA: Diagnosis not present

## 2022-01-04 DIAGNOSIS — M961 Postlaminectomy syndrome, not elsewhere classified: Secondary | ICD-10-CM | POA: Diagnosis not present

## 2022-01-04 LAB — BASIC METABOLIC PANEL
BUN/Creatinine Ratio: 14 (ref 12–28)
BUN: 13 mg/dL (ref 8–27)
CO2: 25 mmol/L (ref 20–29)
Calcium: 8.6 mg/dL — ABNORMAL LOW (ref 8.7–10.3)
Chloride: 101 mmol/L (ref 96–106)
Creatinine, Ser: 0.95 mg/dL (ref 0.57–1.00)
Glucose: 117 mg/dL — ABNORMAL HIGH (ref 70–99)
Potassium: 4.2 mmol/L (ref 3.5–5.2)
Sodium: 141 mmol/L (ref 134–144)
eGFR: 62 mL/min/{1.73_m2} (ref >59)

## 2022-01-10 ENCOUNTER — Telehealth: Payer: Self-pay

## 2022-01-10 NOTE — Telephone Encounter (Signed)
Patient requested second opinion for Pulmonology at office visit- advised we could have her see Dr.Ellison, gave her the information for their office. She will call them to see about seeing her.   Patient thankful for call.

## 2022-01-11 ENCOUNTER — Telehealth: Payer: Self-pay | Admitting: Emergency Medicine

## 2022-01-11 NOTE — Telephone Encounter (Signed)
Dr Lamonte Sakai, are you okay with the pt switching to Dr Loanne Drilling?

## 2022-01-12 NOTE — Telephone Encounter (Signed)
I am ok with this if dr Loanne Drilling agrees.  Note she will need follow up CT imaging to follow presumed inflammatory changes.

## 2022-01-17 ENCOUNTER — Encounter (HOSPITAL_BASED_OUTPATIENT_CLINIC_OR_DEPARTMENT_OTHER): Payer: Medicare Other | Attending: General Surgery | Admitting: General Surgery

## 2022-01-17 DIAGNOSIS — I890 Lymphedema, not elsewhere classified: Secondary | ICD-10-CM | POA: Diagnosis not present

## 2022-01-17 DIAGNOSIS — L97828 Non-pressure chronic ulcer of other part of left lower leg with other specified severity: Secondary | ICD-10-CM | POA: Diagnosis not present

## 2022-01-17 DIAGNOSIS — Z9981 Dependence on supplemental oxygen: Secondary | ICD-10-CM | POA: Diagnosis not present

## 2022-01-17 DIAGNOSIS — L97818 Non-pressure chronic ulcer of other part of right lower leg with other specified severity: Secondary | ICD-10-CM | POA: Diagnosis not present

## 2022-01-17 DIAGNOSIS — J449 Chronic obstructive pulmonary disease, unspecified: Secondary | ICD-10-CM | POA: Diagnosis not present

## 2022-01-17 DIAGNOSIS — F17200 Nicotine dependence, unspecified, uncomplicated: Secondary | ICD-10-CM | POA: Diagnosis not present

## 2022-01-17 NOTE — Progress Notes (Signed)
Joan, [name] ([id number]) Visit Report for 01/17/2022 Abuse Risk Screen Details Patient Name: Date of Service: A HA Joan Heck NETTE M. 01/17/2022 1:30 PM Medical Record Number: [id number] Patient Account Number: [id number] Date of Birth/Sex: Treating RN: 09-11-1945 (76 y.o. Joan [name] Primary Care Joan [name]: Joan [name] Other Clinician: Referring Joan [name]: Treating Joan [name]/Extender: Joan [name]: 0 Abuse Risk Screen Items Answer ABUSE RISK SCREEN: Has anyone close to you tried to hurt or harm you recentlyo No Do you feel uncomfortable with anyone in your familyo No Has anyone forced you do things that you didnt want to doo No Electronic Signature(s) Signed: 01/17/2022 5:44:21 PM By: Joan [name] Entered By: Joan [name] on 01/17/2022 14:11:10 -------------------------------------------------------------------------------- Activities of Daily Living Details Patient Name: Date of Service: A HA Joan Heck NETTE M. 01/17/2022 1:30 PM Medical Record Number: [id number] Patient Account Number: [id number] Date of Birth/Sex: Treating RN: Jul 22, 1945 (76 y.o. Joan [name] Primary Care Joan [name]: Joan [name] Other Clinician: Referring Joan [name]: Treating Joan [name]/Extender: Joan [name]: 0 Activities of Daily Living Items Answer Activities of Daily Living (Please select one for each item) Drive Automobile Completely Able T Medications ake Completely Able Use T elephone Completely Able Care for Appearance Completely Able Use T oilet Completely Able Bath / Shower Completely Able Dress Self Completely Able Feed Self Completely Able Walk Completely Able Get In / Out Bed Completely Able Housework Completely Able Prepare Meals Completely [place] for Self Completely Able Electronic Signature(s) Signed: 01/17/2022 5:44:21 PM By:  Joan [name] Entered By: Joan [name] on 01/17/2022 14:11:26 -------------------------------------------------------------------------------- Education Screening Details Patient Name: Date of Service: A HA Joan Joan Royals NETTE M. 01/17/2022 1:30 PM Medical Record Number: [id number] Patient Account Number: [id number] Date of Birth/Sex: Treating RN: [birth year] (76 y.o. Joan [name] Primary Care Joan [name]: Joan [name] Other Clinician: Referring Joan [name]: Treating Joan [name]/Extender: Joan [name]: 0 Primary Learner Assessed: Patient Learning Preferences/Education Level/Primary Language Learning Preference: Explanation, Demonstration, Video, Printed Material Highest Education Level: High School Preferred Language: English Cognitive Barrier Language Barrier: No Translator Needed: No Memory Deficit: No Emotional Barrier: No Cultural/Religious Beliefs Affecting Medical Care: No Physical Barrier Impaired Vision: No Impaired Hearing: No Decreased Hand dexterity: No Knowledge/Comprehension Knowledge Level: Medium Comprehension Level: Medium Ability to understand written instructions: Medium Ability to understand verbal instructions: Medium Motivation Anxiety Level: Calm Cooperation: Cooperative Education Importance: Acknowledges Need Interest in Health Problems: Asks Questions Perception: Coherent Willingness to Engage in Self-Management Medium Activities: Readiness to Engage in Self-Management Medium Activities: Electronic Signature(s) Signed: 01/17/2022 5:44:21 PM By: Joan [name] Entered By: Joan [name] on 01/17/2022 14:11:47 -------------------------------------------------------------------------------- Fall Risk Assessment Details Patient Name: Date of Service: A HA Joan [name], Joan NETTE M. 01/17/2022 1:30 PM Medical Record Number: [id number] Patient Account Number: [id number] Date of Birth/Sex: Treating  RN: 09-Sep-1945 (76 y.o. Joan [name] Primary Care Mikhi Athey: Joan [name] Other Clinician: Referring Joan [name]: Treating Joan [name]/Extender: Joan [name]: 0 Fall Risk Assessment Items Have you had 2 or more falls in the last 12 monthso 0 No Have you had any fall that resulted in injury in the last 12 monthso 0 No FALLS RISK SCREEN History of falling - immediate or within 3 months 0 No Secondary diagnosis (Do you have 2 or more medical diagnoseso) 15 Yes Ambulatory aid None/bed rest/wheelchair/nurse 0 No Crutches/cane/walker 15 Yes Furniture 0 No Intravenous therapy Access/Saline/Heparin Lock 0 No  Gait/Transferring Normal/ bed rest/ wheelchair 0 No Weak (short steps with or without shuffle, stooped but able to lift head while walking, may seek 0 No support from furniture) Impaired (short steps with shuffle, may have difficulty arising from chair, head down, impaired 0 No balance) Mental Status Oriented to own ability 0 Yes Electronic Signature(s) Signed: 01/17/2022 5:44:21 PM By: Joan [name] Entered By: Joan [name] on 01/17/2022 14:12:05 -------------------------------------------------------------------------------- Foot Assessment Details Patient Name: Date of Service: A HA Joan Joan Royals NETTE M. 01/17/2022 1:30 PM Medical Record Number: [id number] Patient Account Number: [id number] Date of Birth/Sex: Treating RN: 1945/11/17 (76 y.o. Joan [name] Primary Care Joan [name]: Joan [name] Other Clinician: Referring Joan [name]: Treating Joan [name]/Extender: Joan [name]: 0 Foot Assessment Items Site Locations + = Sensation present, - = Sensation absent, C = Callus, U = Ulcer R = Redness, W = Warmth, M = Maceration, PU = Pre-ulcerative lesion F = Fissure, S = Swelling, D = Dryness Assessment Right: Left: Other Deformity: No No Prior Foot Ulcer: No No Prior  Amputation: No No Charcot Joint: No No Ambulatory Status: Ambulatory With Help Assistance Device: Cane Gait: Electronic Signature(s) Signed: 01/17/2022 5:44:21 PM By: Joan [name] Entered By: Joan [name] on 01/17/2022 14:13:32 -------------------------------------------------------------------------------- Nutrition Risk Screening Details Patient Name: Date of Service: A HA Joan Joan Royals NETTE M. 01/17/2022 1:30 PM Medical Record Number: [id number] Patient Account Number: [id number] Date of Birth/Sex: Treating RN: 06-28-1946 (76 y.o. Joan [name] Primary Care Mistie Adney: Joan [name] Other Clinician: Referring Myana Schlup: Treating [place]/Extender: Joan [name]: 0 Height (in): 60 Weight (lbs): 173 Body Mass Index (BMI): 33.8 Nutrition Risk Screening Items Score Screening NUTRITION RISK SCREEN: I have an illness or condition that made me change the kind and/or amount of food I eat 0 No I eat fewer than two meals per day 0 No I eat few fruits and vegetables, or milk products 0 No I have three or more drinks of beer, liquor or wine almost every day 0 No I have tooth or mouth problems that make it hard for me to eat 0 No I don't always have enough money to buy the food I need 0 No I eat alone most of the time 0 No I take three or more different prescribed or over-the-counter drugs a day 1 Yes Without wanting to, I have lost or gained 10 pounds in the last six months 2 Yes I am not always physically able to shop, cook and/or feed myself 0 No Nutrition Protocols Good Risk Protocol Moderate Risk Protocol 0 Provide education on nutrition High Risk Proctocol Risk Level: Moderate Risk Score: 3 Electronic Signature(s) Signed: 01/17/2022 5:44:21 PM By: Joan [name] Entered By: Joan [name] on 01/17/2022 14:13:11

## 2022-01-17 NOTE — Telephone Encounter (Signed)
[place] with me.  She will need CT chest without contrast 02/23/22 and can follow-up with me after CT scan

## 2022-01-17 NOTE — Progress Notes (Signed)
Joan [name], Joan [name] ([id number]) Visit Report for 01/17/2022 Allergy List Details Patient Name: Date of Service: A HA Joan Heck NETTE M. 01/17/2022 1:30 PM Medical Record Number: [id number] Patient Account Number: [id number] Date of Birth/Sex: Treating RN: 11-Aug-1945 (76 y.o. [name] Primary Care Laisha Rau: Roma Schanz Other Clinician: Referring Derl Abalos: Treating Sobia Karger/Extender: Arville Go Weeks in Treatment: 0 Allergies Active Allergies morphine 5-A Reductase Inhibitor, A lfa zasteroids amitriptyline Augmentin gabapentin triamterene Allergy Notes Electronic Signature(s) Signed: 01/17/2022 5:44:21 PM By: Adline Peals Entered By: Adline Peals on 01/17/2022 13:56:50 -------------------------------------------------------------------------------- Arrival Information Details Patient Name: Date of Service: A HA Joan Joan Royals NETTE M. 01/17/2022 1:30 PM Medical Record Number: [id number] Patient Account Number: [id number] Date of Birth/Sex: Treating RN: 05-15-1946 (76 y.o. [name] Primary Care Kashon Kraynak: Roma Schanz Other Clinician: Referring Lexii Walsh: Treating Vennela Jutte/Extender: Arville Go Weeks in Treatment: 0 Visit Information Patient Arrived: Cane Arrival Time: 13:52 Accompanied By: self Transfer Assistance: None Patient Identification Verified: Yes Secondary Verification Process Completed: Yes Patient Requires Transmission-Based Precautions: No Patient Has Alerts: No Electronic Signature(s) Signed: 01/17/2022 5:44:21 PM By: Adline Peals Entered By: Adline Peals on 01/17/2022 13:54:03 -------------------------------------------------------------------------------- Clinic Level of Care Assessment Details Patient Name: Date of Service: A HA Joan Heck NETTE M. 01/17/2022 1:30 PM Medical Record Number: [id number] Patient Account Number: [id number] Date of Birth/Sex: Treating  RN: 1945-12-13 (76 y.o. [name] Primary Care Joan [name]: Roma Schanz Other Clinician: Referring Trixie Maclaren: Treating Copeland Neisen/Extender: Arville Go Weeks in Treatment: 0 Clinic Level of Care Assessment Items TOOL 1 Quantity Score X- 1 0 Use when EandM and Procedure is performed on INITIAL visit ASSESSMENTS - Nursing Assessment / Reassessment X- 1 20 General Physical Exam (combine w/ comprehensive assessment (listed just below) when performed on new pt. evals) X- 1 25 Comprehensive Assessment (HX, ROS, Risk Assessments, Wounds Hx, etc.) ASSESSMENTS - Wound and Skin Assessment / Reassessment '[]'$  - 0 Dermatologic / Skin Assessment (not related to wound area) ASSESSMENTS - Ostomy and/or Continence Assessment and Care '[]'$  - 0 Incontinence Assessment and Management '[]'$  - 0 Ostomy Care Assessment and Management (repouching, etc.) PROCESS - Coordination of Care X - Simple Patient / Family Education for ongoing care 1 15 '[]'$  - 0 Complex (extensive) Patient / Family Education for ongoing care X- 1 10 Staff obtains Programmer, systems, Records, T Results / Process Orders est '[]'$  - 0 Staff telephones HHA, Nursing Homes / Clarify orders / etc '[]'$  - 0 Routine Transfer to another Facility (non-emergent condition) '[]'$  - 0 Routine Hospital Admission (non-emergent condition) X- 1 15 New Admissions / Biomedical engineer / Ordering NPWT Apligraf, etc. , '[]'$  - 0 Emergency Hospital Admission (emergent condition) PROCESS - Special Needs '[]'$  - 0 Pediatric / Minor Patient Management '[]'$  - 0 Isolation Patient Management '[]'$  - 0 Hearing / Language / Visual special needs '[]'$  - 0 Assessment of Community assistance (transportation, D/C planning, etc.) '[]'$  - 0 Additional assistance / Altered mentation '[]'$  - 0 Support Surface(s) Assessment (bed, cushion, seat, etc.) INTERVENTIONS - Miscellaneous '[]'$  - 0 External ear exam '[]'$  - 0 Patient Transfer (multiple staff / Civil Service fast streamer  / Similar devices) '[]'$  - 0 Simple Staple / Suture removal (25 or less) '[]'$  - 0 Complex Staple / Suture removal (26 or more) '[]'$  - 0 Hypo/Hyperglycemic Management (do not check if billed separately) '[]'$  - 0 Ankle / Brachial Index (ABI) - do not check if billed separately Has the patient been seen at the  hospital within the last three years: Yes Total Score: 85 Level Of Care: New/Established - Level 3 Electronic Signature(s) Signed: 01/17/2022 5:44:21 PM By: Adline Peals Entered By: Adline Peals on 01/17/2022 17:26:51 -------------------------------------------------------------------------------- Encounter Discharge Information Details Patient Name: Date of Service: A HA Joan Joan Royals NETTE M. 01/17/2022 1:30 PM Medical Record Number: [id number] Patient Account Number: [id number] Date of Birth/Sex: Treating RN: 1945-10-23 (76 y.o. [name] Primary Care Laisa Larrick: Roma Schanz Other Clinician: Referring Jaymes Hang: Treating Ilia Dimaano/Extender: Arville Go Weeks in Treatment: 0 Encounter Discharge Information Items Discharge Condition: Stable Ambulatory Status: Cane Discharge Destination: Home Transportation: Private Auto Accompanied By: self Schedule Follow-up Appointment: Yes Clinical Summary of Care: Patient Declined Electronic Signature(s) Signed: 01/17/2022 5:44:21 PM By: Adline Peals Entered By: Adline Peals on 01/17/2022 17:28:21 -------------------------------------------------------------------------------- Lower Extremity Assessment Details Patient Name: Date of Service: A HA Joan Joan Royals NETTE M. 01/17/2022 1:30 PM Medical Record Number: [id number] Patient Account Number: [id number] Date of Birth/Sex: Treating RN: 05/05/1946 (76 y.o. [name] Primary Care Jearlean Demauro: Roma Schanz Other Clinician: Referring Teodor Prater: Treating Kross Swallows/Extender: Fredirick Maudlin Annell Greening Weeks in Treatment:  0 Electronic Signature(s) Signed: 01/17/2022 5:44:21 PM By: Adline Peals Entered By: Adline Peals on 01/17/2022 14:19:25 -------------------------------------------------------------------------------- Multi Wound Chart Details Patient Name: Date of Service: A HA Joan Joan Royals NETTE M. 01/17/2022 1:30 PM Medical Record Number: [id number] Patient Account Number: [id number] Date of Birth/Sex: Treating RN: 11-05-1945 (76 y.o. [name] Primary Care Jadaya Sommerfield: Roma Schanz Other Clinician: Referring Gissel Keilman: Treating Raychell Holcomb/Extender: Arville Go Weeks in Treatment: 0 Vital Signs Height(in): 60 Pulse(bpm): 76 Weight(lbs): 173 Blood Pressure(mmHg): 125/88 Body Mass Index(BMI): 33.8 Temperature(F): 98.5 Respiratory Rate(breaths/min): 18 Wound Assessments Treatment Notes Electronic Signature(s) Signed: 01/17/2022 2:26:53 PM By: Fredirick Maudlin MD FACS Signed: 01/17/2022 5:44:21 PM By: Adline Peals Entered By: Fredirick Maudlin on 01/17/2022 14:26:53 -------------------------------------------------------------------------------- Patient/Caregiver Education Details Patient Name: Date of Service: A HA Joan Dillard Cannon 7/12/2023andnbsp1:30 PM Medical Record Number: [id number] Patient Account Number: [id number] Date of Birth/Gender: Treating RN: [birth year] (76 y.o. [name] Primary Care Physician: Roma Schanz Other Clinician: Referring Physician: Treating Physician/Extender: Arville Go Weeks in Treatment: 0 Education Assessment Education Provided To: Patient Education Topics Provided Venous: Handouts: Controlling Swelling with Compression Stockings Methods: Explain/Verbal, Printed Responses: Reinforcements needed, State content correctly Electronic Signature(s) Signed: 01/17/2022 5:44:21 PM By: Adline Peals Entered By: Adline Peals on 01/17/2022  14:23:42 -------------------------------------------------------------------------------- [place] Details Patient Name: Date of Service: A HA Joan Joan Royals NETTE M. 01/17/2022 1:30 PM Medical Record Number: [id number] Patient Account Number: [id number] Date of Birth/Sex: Treating RN: 15-Nov-1945 (76 y.o. [name] Primary Care Draken Farrior: Roma Schanz Other Clinician: Referring Lillah Standre: Treating Daylee Delahoz/Extender: Arville Go Weeks in Treatment: 0 Vital Signs Time Taken: 13:55 Temperature (F): 98.5 Height (in): 60 Pulse (bpm): 76 Source: Stated Respiratory Rate (breaths/min): 18 Weight (lbs): 173 Blood Pressure (mmHg): 125/88 Source: Stated Reference Range: 80 - 120 mg / dl Body Mass Index (BMI): 33.8 Electronic Signature(s) Signed: 01/17/2022 5:44:21 PM By: Adline Peals Entered By: Adline Peals on 01/17/2022 13:55:28

## 2022-01-17 NOTE — Telephone Encounter (Signed)
Called and left voicemail for patient to call office back in regards to her office visit. Told patient that per Dr Loanne Drilling she can follow up with her in office after her CT scan in August for her to get her second opinion. Advised patient to call office back to get this office visit scheduled if she wished. Nothing further needed

## 2022-01-17 NOTE — Progress Notes (Signed)
Joan [name], Joan [name] ([id number]) Visit Report for 01/17/2022 Problem List Details Patient Name: Date of Service: A HA Joan [name] NETTE M. 01/17/2022 1:30 PM Medical Record Number: [id number] Patient Account Number: [id number] Date of Birth/Sex: Treating RN: January 07, 1946 (76 y.o. Female) Adline Peals Primary Care Provider: Roma Schanz Other Clinician: Referring Provider: Treating Provider/Extender: Fredirick Maudlin Annell Greening Weeks in Treatment: 0 Active Problems ICD-10 Encounter Code Description Active Date MDM Diagnosis I87.2 Venous insufficiency (chronic) (peripheral) 01/17/2022 No Yes Inactive Problems Resolved Problems Electronic Signature(s) Signed: 01/17/2022 2:02:15 PM By: Fredirick Maudlin MD FACS Entered By: Fredirick Maudlin on 01/17/2022 14:02:15 -------------------------------------------------------------------------------- HxROS Details Patient Name: Date of Service: A HA Joan [name], [name] 01/17/2022 1:30 PM Medical Record Number: [id number] Patient Account Number: [id number] Date of Birth/Sex: Treating RN: 09/24/1945 (76 y.o. Female) Adline Peals Primary Care Provider: Roma Schanz Other Clinician: Referring Provider: Treating Provider/Extender: Arville Go Weeks in Treatment: 0 Information Obtained From Patient Constitutional Symptoms (General Health) Complaints and Symptoms: Negative for: Fatigue; Fever; Chills; Marked Weight Change Eyes Complaints and Symptoms: Positive for: Dry Eyes Negative for: Vision Changes; Glasses / Contacts Medical History: Positive for: Cataracts - removed; Glaucoma Ear/Nose/Mouth/Throat Complaints and Symptoms: Negative for: Chronic sinus problems or rhinitis Cardiovascular Complaints and Symptoms: Positive for: Chest pain - once in a while Medical History: Positive for: Arrhythmia - murmur, PVCs; Hypertension Negative for: Congestive Heart Failure Gastrointestinal Complaints and  Symptoms: Negative for: Frequent diarrhea; Nausea; Vomiting Medical History: Past Medical History Notes: chronic constipation Endocrine Complaints and Symptoms: Negative for: Heat/cold intolerance Medical History: Negative for: Type I Diabetes; Type II Diabetes Genitourinary Complaints and Symptoms: Positive for: Frequent urination - fluid pills Integumentary (Skin) Complaints and Symptoms: Negative for: Wounds Neurologic Complaints and Symptoms: Negative for: Numbness/parasthesias Psychiatric Complaints and Symptoms: Negative for: Claustrophobia; Suicidal Hematologic/Lymphatic Medical History: Positive for: Lymphedema Past Medical History Notes: hyperlipidemia, hypothyroidism Respiratory Medical History: Positive for: Sleep Apnea Past Medical History Notes: on O2, COPD Immunological Musculoskeletal Medical History: Positive for: Osteoarthritis Oncologic Medical History: Negative for: Received Chemotherapy; Received Radiation HBO Extended History Items Eyes: Eyes: Cataracts Glaucoma Immunizations Pneumococcal Vaccine: Received Pneumococcal Vaccination: Yes Received Pneumococcal Vaccination On or After 60th Birthday: Yes Implantable Devices No devices added Hospitalization / Surgery History Type of Hospitalization/Surgery vitrectomy total hip athroplasty total knee arthroplasty x2 salpingo oophorectomy orif distal radius fracture orif wrist fracture back surgery x8 cholecystectomy laparoscopic lysis of adhesions shoulder arthroscopy thyroidectomy c-section foot surgery ankle surgery colonoscopy elbow surgery infusion pump implantation polypectomy upper GI endoscopy Family and Social History Cancer: Yes - Father; Diabetes: Yes - Mother; Heart Disease: Yes - Mother; Hereditary Spherocytosis: No; Hypertension: Yes - Mother; Kidney Disease: No; Lung Disease: No; Seizures: No; Stroke: Yes - Mother; Thyroid Problems: Yes - Mother; Tuberculosis: No;  Current every day smoker - trying to quit - started on 07/09/1968; Marital Status - Divorced; Alcohol Use: Never; Drug Use: No History; Caffeine Use: Daily; Financial Concerns: No; Food, Clothing or Shelter Needs: No; Support System Lacking: No; Transportation Concerns: No Electronic Signature(s) Unsigned Entered By: Adline Peals on 01/17/2022 14:10:59 Signature(s): Date(s):

## 2022-01-22 ENCOUNTER — Telehealth: Payer: Self-pay | Admitting: Family Medicine

## 2022-01-22 DIAGNOSIS — E876 Hypokalemia: Secondary | ICD-10-CM

## 2022-01-22 MED ORDER — POTASSIUM CHLORIDE CRYS ER 20 MEQ PO TBCR: 80.0000 meq | EXTENDED_RELEASE_TABLET | Freq: Every day | ORAL | 3 refills | Status: DC

## 2022-01-22 NOTE — Telephone Encounter (Signed)
FYI has stopped taking trelegy due to cost

## 2022-01-22 NOTE — Telephone Encounter (Signed)
Pt called to get a refill on the following medication. She also stated that the Rx is supposed to read 4 a day per Dr. Etter Sjogren changing it the last time she was here:  Medication:   potassium chloride SA (KLOR-CON M) 20 MEQ tablet [[id number]]   Has the patient contacted their pharmacy? No. (If no, request that the patient contact the pharmacy for the refill.) (If yes, when and what did the pharmacy advise?)  Preferred Pharmacy (with phone number or street name):   [place], [place] - [address] [id number]  [address] [place], [place] [address]  Phone:  [phone]  Fax:  [phone]   Agent: Please be advised that RX refills may take up to 3 business days. We ask that you follow-up with your pharmacy.  Pt also stated that she is having to stop taking trilogy as the cost is too much to handle and she wanted to let Dr. Etter Sjogren know.

## 2022-01-23 DIAGNOSIS — J449 Chronic obstructive pulmonary disease, unspecified: Secondary | ICD-10-CM | POA: Diagnosis not present

## 2022-01-23 DIAGNOSIS — R0902 Hypoxemia: Secondary | ICD-10-CM | POA: Diagnosis not present

## 2022-01-25 ENCOUNTER — Other Ambulatory Visit: Payer: Self-pay | Admitting: Family Medicine

## 2022-01-25 DIAGNOSIS — G4700 Insomnia, unspecified: Secondary | ICD-10-CM

## 2022-01-31 ENCOUNTER — Other Ambulatory Visit: Payer: Self-pay | Admitting: Family Medicine

## 2022-02-12 ENCOUNTER — Telehealth: Payer: Self-pay | Admitting: Emergency Medicine

## 2022-02-13 ENCOUNTER — Other Ambulatory Visit: Payer: Self-pay | Admitting: *Deleted

## 2022-02-13 ENCOUNTER — Other Ambulatory Visit: Payer: Self-pay | Admitting: Family Medicine

## 2022-02-13 DIAGNOSIS — R9389 Abnormal findings on diagnostic imaging of other specified body structures: Secondary | ICD-10-CM

## 2022-02-13 DIAGNOSIS — Z1231 Encounter for screening mammogram for malignant neoplasm of breast: Secondary | ICD-10-CM

## 2022-02-13 NOTE — Telephone Encounter (Signed)
Called and spoke with patient, she is requesting to have the CT scan done at [facility] hospital.  She is not available on 8/18 or //24.  She would like for the CT to be schedule mid day (12 pm or after).  I advised her that I would put that in the notes to the PCCs.  I let her know that she would receive a call from one of our PCCs to schedule the CT scan and once it is scheduled, she can schedule her OV with Dr. Loanne Drilling.  She verbalized understanding.  Nothing further needed.

## 2022-02-14 ENCOUNTER — Ambulatory Visit: Payer: Medicare Other | Admitting: Emergency Medicine

## 2022-02-15 DIAGNOSIS — I8312 Varicose veins of left lower extremity with inflammation: Secondary | ICD-10-CM | POA: Diagnosis not present

## 2022-02-15 DIAGNOSIS — I8311 Varicose veins of right lower extremity with inflammation: Secondary | ICD-10-CM | POA: Diagnosis not present

## 2022-02-15 DIAGNOSIS — Z85828 Personal history of other malignant neoplasm of skin: Secondary | ICD-10-CM | POA: Diagnosis not present

## 2022-02-15 DIAGNOSIS — I872 Venous insufficiency (chronic) (peripheral): Secondary | ICD-10-CM | POA: Diagnosis not present

## 2022-02-15 DIAGNOSIS — L308 Other specified dermatitis: Secondary | ICD-10-CM | POA: Diagnosis not present

## 2022-02-23 ENCOUNTER — Other Ambulatory Visit: Payer: Self-pay | Admitting: Family Medicine

## 2022-02-23 DIAGNOSIS — J449 Chronic obstructive pulmonary disease, unspecified: Secondary | ICD-10-CM | POA: Diagnosis not present

## 2022-02-23 DIAGNOSIS — E785 Hyperlipidemia, unspecified: Secondary | ICD-10-CM

## 2022-02-23 DIAGNOSIS — R0902 Hypoxemia: Secondary | ICD-10-CM | POA: Diagnosis not present

## 2022-02-26 ENCOUNTER — Ambulatory Visit (HOSPITAL_COMMUNITY)
Admission: RE | Admit: 2022-02-26 | Discharge: 2022-02-26 | Disposition: A | Discharge status: Home / Self Care | Payer: Medicare Other | Source: Ambulatory Visit | Attending: Pulmonary Disease | Admitting: Pulmonary Disease

## 2022-02-26 DIAGNOSIS — R911 Solitary pulmonary nodule: Secondary | ICD-10-CM | POA: Diagnosis not present

## 2022-02-26 DIAGNOSIS — I700 Atherosclerosis of aorta: Secondary | ICD-10-CM | POA: Diagnosis not present

## 2022-02-26 DIAGNOSIS — R9389 Abnormal findings on diagnostic imaging of other specified body structures: Secondary | ICD-10-CM | POA: Insufficient documentation

## 2022-02-26 DIAGNOSIS — J439 Emphysema, unspecified: Secondary | ICD-10-CM | POA: Diagnosis not present

## 2022-03-01 DIAGNOSIS — H04123 Dry eye syndrome of bilateral lacrimal glands: Secondary | ICD-10-CM | POA: Diagnosis not present

## 2022-03-01 DIAGNOSIS — M961 Postlaminectomy syndrome, not elsewhere classified: Secondary | ICD-10-CM | POA: Diagnosis not present

## 2022-03-01 DIAGNOSIS — M62830 Muscle spasm of back: Secondary | ICD-10-CM | POA: Diagnosis not present

## 2022-03-01 DIAGNOSIS — H16223 Keratoconjunctivitis sicca, not specified as Sjogren's, bilateral: Secondary | ICD-10-CM | POA: Diagnosis not present

## 2022-03-01 DIAGNOSIS — Z79891 Long term (current) use of opiate analgesic: Secondary | ICD-10-CM | POA: Diagnosis not present

## 2022-03-01 DIAGNOSIS — G894 Chronic pain syndrome: Secondary | ICD-10-CM | POA: Diagnosis not present

## 2022-03-05 ENCOUNTER — Ambulatory Visit: Payer: Medicare Other | Admitting: Pulmonary Disease

## 2022-03-07 ENCOUNTER — Ambulatory Visit: Payer: Medicare Other

## 2022-03-13 ENCOUNTER — Telehealth: Payer: Self-pay | Admitting: Family Medicine

## 2022-03-13 NOTE — Telephone Encounter (Signed)
Pt made aware. Pt will call pulmonary

## 2022-03-13 NOTE — Telephone Encounter (Signed)
Pt called stating that she was wanting to look into getting a month's sample of Trelegy as she is currently in the process for applying for assistance and will run out before she hears back.Pt wants a call back when other info ids available.

## 2022-03-13 NOTE — Telephone Encounter (Signed)
We don't have samples of these. Please advise

## 2022-03-14 DIAGNOSIS — M25562 Pain in left knee: Secondary | ICD-10-CM | POA: Diagnosis not present

## 2022-03-15 ENCOUNTER — Telehealth: Payer: Self-pay | Admitting: Family Medicine

## 2022-03-15 ENCOUNTER — Telehealth: Payer: Self-pay | Admitting: Pulmonary Disease

## 2022-03-15 DIAGNOSIS — J439 Emphysema, unspecified: Secondary | ICD-10-CM

## 2022-03-15 NOTE — Telephone Encounter (Signed)
Pt states she has fallen into the donut hole for trelegy and has applied to financial asst. She is needing a 79mrx printed and mailed to her so she can send it to trelegy herself.

## 2022-03-15 NOTE — Telephone Encounter (Signed)
Pt states she has fallen into the donut hole for trelegy and has applied to financial assistance but is needing samples in the meantime. Patient has bad knees and wanted to know if we have samples, if they can be mailed to her.   Please advise. Call back [phone].

## 2022-03-16 MED ORDER — TRELEGY ELLIPTA 100-62.5-25 MCG/ACT IN AEPB: 1.0000 | INHALATION_SPRAY | Freq: Every day | RESPIRATORY_TRACT | 2 refills | Status: DC

## 2022-03-16 NOTE — Telephone Encounter (Signed)
Spoke with pt who is currently using Trelegy 100 and all we have is Trelegy 200. Pt was informed inhalers can not be mailed and pt stated she would be able to pick them up form office if Dr. Lamonte Sakai ok'ed the [place]. Dr. Lamonte Sakai please advise.

## 2022-03-16 NOTE — Telephone Encounter (Signed)
Agree with using trelegy 200 temporarily with samples until she is able to get her usual dose filled.

## 2022-03-16 NOTE — Telephone Encounter (Signed)
Rx printed and placed in the mail

## 2022-03-19 MED ORDER — TRELEGY ELLIPTA 200-62.5-25 MCG/ACT IN AEPB: 1.0000 | INHALATION_SPRAY | Freq: Every day | RESPIRATORY_TRACT | 0 refills | Status: DC

## 2022-03-19 NOTE — Telephone Encounter (Signed)
Called and spoke with pt letting her know info per RB and she verbalized understanding. Made sure pt was aware that the Trelegy 200 was temporary until she was able to get her Trelegy 100. Samples placed up front for pt. Nothing further needed.

## 2022-03-22 ENCOUNTER — Telehealth: Payer: Self-pay | Admitting: *Deleted

## 2022-03-22 NOTE — Telephone Encounter (Signed)
Spoke with Estill Bamberg. Confirmed to use the medication the same. Informed patient. Nothing further needed.

## 2022-03-26 DIAGNOSIS — R0902 Hypoxemia: Secondary | ICD-10-CM | POA: Diagnosis not present

## 2022-03-26 DIAGNOSIS — J449 Chronic obstructive pulmonary disease, unspecified: Secondary | ICD-10-CM | POA: Diagnosis not present

## 2022-03-27 ENCOUNTER — Ambulatory Visit: Payer: Medicare Other

## 2022-03-30 ENCOUNTER — Ambulatory Visit: Payer: Medicare Other | Admitting: Pulmonary Disease

## 2022-03-30 ENCOUNTER — Telehealth: Payer: Self-pay | Admitting: Pulmonary Disease

## 2022-03-30 ENCOUNTER — Encounter: Payer: Self-pay | Admitting: Pulmonary Disease

## 2022-03-30 VITALS — BP 130/66 | HR 79 | Ht 60.0 in | Wt 171.2 lb

## 2022-03-30 DIAGNOSIS — R0902 Hypoxemia: Secondary | ICD-10-CM | POA: Diagnosis not present

## 2022-03-30 DIAGNOSIS — Z23 Encounter for immunization: Secondary | ICD-10-CM

## 2022-03-30 DIAGNOSIS — J449 Chronic obstructive pulmonary disease, unspecified: Secondary | ICD-10-CM

## 2022-03-30 MED ORDER — IPRATROPIUM BROMIDE 0.06 % NA SOLN: 2.0000 | Freq: Two times a day (BID) | NASAL | 5 refills | Status: AC | PRN

## 2022-03-30 MED ORDER — FLUTICASONE FUROATE-VILANTEROL 200-25 MCG/ACT IN AEPB: 1.0000 | INHALATION_SPRAY | Freq: Every day | RESPIRATORY_TRACT | 5 refills | Status: DC

## 2022-03-30 NOTE — Patient Instructions (Addendum)
  Emphysema/COPD --STOP Trelegy --START Breo 200 ONE puff ONCE a day. Rinse out mouth after use  Hypoxemia --Ambulatory 11/07/21 with no desaturations --REQUEST ONO 11/29/21 [facility]  Abnormal CT suggestive MAC --Reviewed CT 02/26/22 with resolved RML nodularity. Overall stable --ORDER CT Chest without contrast in 6 months. If stable, plan intervals to yearly  Nasal congestion --START atrovent TWO sprays per nostril in the morning. OK to use additional spray if needed --CONTINUE flonase TWO sprays per nostril at night  Follow-up with me in 2 months to check inhaler use

## 2022-03-30 NOTE — Telephone Encounter (Signed)
Patient is stating that [facility] is too expensive with her insurance.   Can we run a ticket to see what is covered with her insurance  Thank you

## 2022-03-30 NOTE — Progress Notes (Unsigned)
Subjective:   PATIENT ID: Joan [name] GENDER: female DOB: [birth year], MRN: [id number]   HPI  Chief Complaint  Patient presents with   New Patient (Initial Visit)    Questions about trelegy CT results    Reason for Visit: New patient  [name] is a 76 year old female active smoker with COPD, pulmonary nodules, GERD, hypothyroidism, osteopenia, glaucoma, HTN, HLD hx PVC who presents as a new patient evaluation.  Previously seen by Dr. Lamonte Sakai for initial consult for COPD, hypoxemia dx by her PCP and CT changes concerning for atypical infection  Denies shortness of breath, cough or wheezing. No limitations in activity except related to her knee problems. She has been on Trelegy but does not feel like it is effective. When her ICS strength was increased, she has had worsening congestion. She is also worried about the side effects related to Trelegy including glaucoma and steroids.  Reported hypoxemia with PCP. However ambulatory O2 in pulmonary with no desaturations on 11/07/21. She reports ONO in May 2023  She currently smokes 1/2 ppd. Previously 1 1/2 ppd. Started at 76 years old. ~ 50 years smoking.  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  Past Medical History:  Diagnosis Date   Arthritis    "shoulders; wrist; probably spine" (06/24/2013)   Blood transfusion without reported diagnosis    Cancer ([place])    skin cancer right leg x 4     Cataract    removed both eyes    Chronic low back pain    followed by Dr Hardin Negus pain mgt   Colon polyp    Constipation    COPD (chronic obstructive pulmonary disease) (HCC)    mild   Esophageal stricture    Finger pain, left    2 fingers on left hand since wrist surgery   GERD (gastroesophageal reflux disease)    Glaucoma    Heart murmur    "slight; not on RX"  (06/24/2013)   History of cardiac arrhythmia    cardiologist- Traci Turner   Hx of colonic polyps 08/28/2004   Hyperlipemia    Hypertension    Hypothyroidism    Osteoarthritis of left knee    advanced   PONV (postoperative nausea and vomiting)    Pt reports symptoms are the result of gallbladder and cholecystectomy, not anesthesia   PVC's (premature ventricular contractions)    Sleep apnea 1990's   "tested; tried mask; couldn't stand it; told me as long as I slept on my side I'd be ok" (06/24/2013)   Thyroid nodule    Tobacco abuse      Family History  Problem Relation Age of Onset   Pancreatic cancer Father    Cancer Father    Diabetes type II Mother    Hypertension Mother    Coronary artery disease Mother 49   Diabetes Mother    Thyroid cancer Mother    Skin cancer Mother    Diabetes Sister    Hypertension Sister    Bone cancer Sister    Breast cancer Sister    Hypertension Brother    Breast cancer Maternal Aunt    Other Neg Hx        No family history of  colon cancer   Colon cancer Neg Hx    Colon polyps Neg Hx    Esophageal cancer Neg Hx  Stomach cancer Neg Hx    Rectal cancer Neg Hx      Social History   Occupational History   Not on file  Tobacco Use   Smoking status: Every Day    Packs/day: 1.50    Years: 50.00    Total pack years: 75.00    Types: Cigarettes   Smokeless tobacco: Never   Tobacco comments:    10 cigarettes smoked daily [facility]J 11/07/21  Vaping Use   Vaping Use: Never used  Substance and Sexual Activity   Alcohol use: No    Alcohol/week: 0.0 standard drinks of alcohol   Drug use: No   Sexual activity: Not Currently    Allergies  Allergen Reactions   Morphine And Related Swelling   5-Alpha Reductase Inhibitors    Amitriptyline Swelling    Leg swelling   Augmentin [Amoxicillin-Pot Clavulanate] Nausea And Vomiting   Gabapentin Itching   Triamterene Itching and Rash     Outpatient Medications Prior to Visit  Medication Sig  Dispense Refill   Acetaminophen (TYLENOL 8 HOUR ARTHRITIS PAIN PO) Take by mouth.     albuterol (VENTOLIN HFA) 108 (90 Base) MCG/ACT inhaler Inhale 2 puffs into the lungs every 6 (six) hours as needed for wheezing or shortness of breath. 6.7 g 2   bisacodyl (DULCOLAX) 5 MG EC tablet Take 5-10 mg by mouth daily as needed for mild constipation.  30 tablet    carisoprodol (SOMA) 350 MG tablet Take 175 mg by mouth 3 (three) times daily.      diclofenac Sodium (VOLTAREN) 1 % GEL as needed.     dorzolamide-timolol (COSOPT) 22.3-6.8 MG/ML ophthalmic solution Place 1 drop into the left eye 2 (two) times daily.     estradiol (ESTRACE) 0.5 MG tablet Take 0.5 mg by mouth at bedtime.   4   fluocinonide ointment (LIDEX) 4.49 % Apply 1 application topically 2 (two) times daily.     fluticasone (FLONASE) 50 MCG/ACT nasal spray Place 2 sprays into both nostrils daily. 16 g 6   Fluticasone-Umeclidin-Vilant (TRELEGY ELLIPTA) 200-62.5-25 MCG/ACT AEPB Inhale 1 puff into the lungs daily. 14 each 0   furosemide (LASIX) 40 MG tablet Take 2 tablets (80 mg total) by mouth daily. 180 tablet 1   levothyroxine (SYNTHROID) 88 MCG tablet TAKE 1 TABLET BY MOUTH  DAILY BEFORE BREAKFAST 90 tablet 3   NONFORMULARY OR COMPOUNDED ITEM Compression socks  20-30 mm/ hg   Dx low ext edema 1 each 1   omeprazole (PRILOSEC) 20 MG capsule Take 2 capsules by mouth once a day. 180 capsule 1   oxyCODONE-acetaminophen (PERCOCET) 10-325 MG tablet Take 1 tablet by mouth every 4 (four) hours as needed for pain.      potassium chloride SA (KLOR-CON M) 20 MEQ tablet Take 4 tablets (80 mEq total) by mouth daily. 270 tablet 3   simvastatin (ZOCOR) 20 MG tablet Take 1 tablet (20 mg total) by mouth at bedtime. 90 tablet 0   spironolactone (ALDACTONE) 25 MG tablet Take 1 tablet (25 mg total) by mouth daily. 90 tablet 3   traZODone (DESYREL) 50 MG tablet TAKE 1 TABLET BY MOUTH AT BEDTIME 90 tablet 0   triamcinolone cream (KENALOG) 0.1 % Apply topically.      COVID-19 mRNA bivalent vaccine, Moderna, (MODERNA COVID-19 BIVAL BOOSTER) 50 MCG/0.5ML injection Inject into the muscle. (Patient not taking: Reported on 03/30/2022) 0.5 mL 0   Fluticasone-Umeclidin-Vilant (TRELEGY ELLIPTA) 100-62.5-25 MCG/ACT AEPB Inhale 1 puff into the lungs daily. (Patient not  taking: Reported on 03/30/2022) 60 each 2   No facility-administered medications prior to visit.    Review of Systems  Constitutional:  Negative for chills, diaphoresis, fever, malaise/fatigue and weight loss.  HENT:  Positive for congestion.   Respiratory:  Negative for cough, hemoptysis, sputum production, shortness of breath and wheezing.   Cardiovascular:  Negative for chest pain, palpitations and leg swelling.     Objective:   Vitals:   03/30/22 1351  BP: 130/66  Pulse: 79  SpO2: 95%  Weight: 171 lb 3.2 oz (77.7 kg)  Height: 5' (1.524 m)   SpO2: 95 % O2 Device: None (Room air)  Physical Exam: General: Well-appearing, no acute distress HENT: [place], AT Eyes: EOMI, no scleral icterus Respiratory: Clear to auscultation bilaterally.  No crackles, wheezing or rales Cardiovascular: RRR, -M/R/G, no JVD Extremities:-Edema,-tenderness Neuro: AAO x4, CNII-XII grossly intact Psych: Normal mood, normal affect   Data Reviewed:  Imaging: CT Chest 11/23/21 - New nodulr RUL consolidation. Peribronchovascular nodularity progressive from 04/18/20. Emphysema  CT Chest 02/26/22 - Resolved RML nodularity. Unchanged nodularity in RUL  PFT: 01/28/20 FVC 1.88 (80%) FEV1 1.32 (75%) Ratio 68  DLCO 76% Interpretation: Mild obstructive defect with mildly reduced DLCO  Labs: CBC    Component Value Date/Time   WBC 6.2 11/16/2021 1355   RBC 4.36 11/16/2021 1355   HGB 13.1 11/16/2021 1355   HGB 13.2 10/29/2019 1411   HCT 38.3 11/16/2021 1355   HCT 39.7 10/29/2019 1411   PLT 213.0 11/16/2021 1355   PLT 237 10/29/2019 1411   MCV 87.7 11/16/2021 1355   MCV 90 10/29/2019 1411   MCH 30.0  10/29/2019 1411   MCH 30.0 09/10/2019 0421   MCHC 34.2 11/16/2021 1355   RDW 13.8 11/16/2021 1355   RDW 12.7 10/29/2019 1411   LYMPHSABS 1.5 11/16/2021 1355   MONOABS 1.0 11/16/2021 1355   EOSABS 0.1 11/16/2021 1355   BASOSABS 0.1 11/16/2021 1355   Absolute eos 11/16/21 - 100 ONO 11/29/21 Duration 2:49:28    Assessment & Plan:   Discussion: 76 year old female active smoker with COPD, pulmonary nodules, GERD, hypothyroidism, osteopenia, glaucoma, HTN, HLD hx PVC who presents as a new patient evaluation. We reviewed inhaler regimen including side effect profile of inhaled ICS. Reviewed prior records including in EMR and ONO with incomplete report. Reviewed CT scan which demonstrates waxing and waning nodules. We did discuss utility of bronchoscopy and would not recommend at this time based on improved chest imaging. She is also overall asymptomatic so would recommend to be conservative regarding procedures. Will step down inhaler to ICS/LABA. Consider LABA/LAMA as alternative if ineffective and to reduce ICS usage.  Emphysema/COPD, mild --STOP Trelegy --START Breo 200 ONE puff ONCE a day. Rinse out mouth after use  Hypoxemia --Ambulatory 11/07/21 with no desaturations --REQUEST ONO 11/29/21 [facility]. Prelim report scanned. Does not appear she needs O2  Abnormal CT suggestive MAC Waxing and waning pulmonary nodules, thought to be inflammatory --Reviewed CT 02/26/22 with resolved RML nodularity. Overall stable --ORDER CT Chest without contrast in 6 months. If stable, plan intervals to yearly  Nasal congestion --START atrovent TWO sprays per nostril in the morning. OK to use additional spray if needed --CONTINUE flonase TWO sprays per nostril at night  Health Maintenance Immunization History  Administered Date(s) Administered   Fluad Quad(high Dose 65+) 04/03/2019, 04/20/2020   Influenza Split 03/28/2012   Influenza Whole 04/28/2007, 05/25/2008, 04/19/2009, 04/18/2010   Influenza, High  Dose Seasonal PF 04/13/2014, 04/04/2015, 04/30/2016, 03/14/2017,  04/29/2018   Influenza,inj,Quad PF,6+ Mos 03/06/2013   Influenza,inj,quad, With Preservative 04/08/2017   Influenza-Unspecified 04/08/2018, 09/30/2021   Moderna Covid-19 Vaccine Bivalent Booster 55yr & up 11/16/2021   Moderna Sars-Covid-2 Vaccination 10/19/2019, 11/16/2019, 05/31/2020   Pneumococcal Conjugate-13 06/07/2014   Pneumococcal Polysaccharide-23 08/12/2007, 10/18/2015   Td 10/23/2004, 09/08/2014   Td (Adult), 2 Lf Tetanus Toxid, Preservative Free 10/23/2004, 09/08/2014   Zoster, Live 03/28/2012   CT Lung Screen - qualified however under surveillance imaging.  Orders Placed This Encounter  Procedures   CT Chest Wo Contrast    Standing Status:   Future    Standing Expiration Date:   03/31/2023    Scheduling Instructions:     Please schedule in 687mo  Order Specific Question:   Preferred imaging location?    Answer:   [facility]   Flu Vaccine QUAD High Dose(Fluad)   Meds ordered this encounter  Medications   fluticasone furoate-vilanterol (BREO ELLIPTA) 200-25 MCG/ACT AEPB    Sig: Inhale 1 puff into the lungs daily.    Dispense:  60 each    Refill:  5   ipratropium (ATROVENT) 0.06 % nasal spray    Sig: Place 2 sprays into both nostrils 2 (two) times daily as needed for rhinitis.    Dispense:  15 mL    Refill:  5    Return in about 2 months (around 05/30/2022).  I have spent a total time of 45-minutes on the day of the appointment reviewing prior documentation, coordinating care and discussing medical diagnosis and plan with the patient/family. Imaging, labs and tests included in this note have been reviewed and interpreted independently by me.  [facility] 03/30/2022 2:10 PM  Office Number [phone]

## 2022-04-02 ENCOUNTER — Other Ambulatory Visit (HOSPITAL_COMMUNITY): Payer: Self-pay

## 2022-04-02 NOTE — Telephone Encounter (Signed)
Test billing for this patient's plan returns the   following results ICS/LABA for products:   Advair disKus  $78.84 Advair hfa $ 108.58 Dulera $ 91.72 Symbicort $ 104.51    [facility] Patient Advocate

## 2022-04-05 ENCOUNTER — Encounter: Payer: Self-pay | Admitting: Pulmonary Disease

## 2022-04-09 ENCOUNTER — Encounter: Payer: Self-pay | Admitting: Pulmonary Disease

## 2022-04-09 MED ORDER — FLUTICASONE-SALMETEROL 250-50 MCG/ACT IN AEPB
1.0000 | INHALATION_SPRAY | Freq: Two times a day (BID) | RESPIRATORY_TRACT | 2 refills | Status: DC
Start: 2022-04-09 — End: 2022-05-25

## 2022-04-09 NOTE — Telephone Encounter (Signed)
[facility] was too expensive, please advise from pharmacy team Dr Loanne Drilling on medications  Test billing for this patient's plan returns the   following results ICS/LABA for products:     Advair disKus  $78.84 Advair hfa $ 108.58 Dulera $ 91.72 Symbicort $ 104.51

## 2022-04-09 NOTE — Telephone Encounter (Signed)
Changed from Breo (due to cost) to Advair Diskus 250. Inhaler ordered with two refills. Discussed change with patient.  She is also applying to Trelegy and if approved, would prefer to switch to that.

## 2022-04-10 ENCOUNTER — Telehealth: Payer: Self-pay

## 2022-04-10 NOTE — Telephone Encounter (Signed)
Contacted [facility] [phone] due to pulse ox summary report, provider unable to read summary due to assessment only written on major parts of the summary. Specialist advised DME [facility] needs to be contacted as she is unable to correct summary on her end.  Contacted [facility] [phone] who stated will speak to coordinator in morning to get corrected

## 2022-04-13 DIAGNOSIS — M170 Bilateral primary osteoarthritis of knee: Secondary | ICD-10-CM | POA: Diagnosis not present

## 2022-04-13 DIAGNOSIS — M25562 Pain in left knee: Secondary | ICD-10-CM | POA: Diagnosis not present

## 2022-04-18 ENCOUNTER — Other Ambulatory Visit (HOSPITAL_COMMUNITY): Payer: Self-pay

## 2022-04-18 MED ORDER — OXYCODONE-ACETAMINOPHEN 10-325 MG PO TABS
1.0000 | ORAL_TABLET | ORAL | 0 refills | Status: DC | PRN
  Filled 2022-04-18: qty 180, 30d supply, fill #0

## 2022-04-20 ENCOUNTER — Other Ambulatory Visit: Payer: Self-pay

## 2022-04-20 MED ORDER — LEVOTHYROXINE SODIUM 88 MCG PO TABS: 88.0000 ug | ORAL_TABLET | Freq: Every day | ORAL | 3 refills | Status: DC

## 2022-04-25 DIAGNOSIS — R0902 Hypoxemia: Secondary | ICD-10-CM | POA: Diagnosis not present

## 2022-04-25 DIAGNOSIS — J449 Chronic obstructive pulmonary disease, unspecified: Secondary | ICD-10-CM | POA: Diagnosis not present

## 2022-04-26 DIAGNOSIS — M961 Postlaminectomy syndrome, not elsewhere classified: Secondary | ICD-10-CM | POA: Diagnosis not present

## 2022-04-26 DIAGNOSIS — M62830 Muscle spasm of back: Secondary | ICD-10-CM | POA: Diagnosis not present

## 2022-04-26 DIAGNOSIS — G894 Chronic pain syndrome: Secondary | ICD-10-CM | POA: Diagnosis not present

## 2022-04-26 DIAGNOSIS — Z79891 Long term (current) use of opiate analgesic: Secondary | ICD-10-CM | POA: Diagnosis not present

## 2022-04-27 ENCOUNTER — Other Ambulatory Visit: Payer: Self-pay | Admitting: Family Medicine

## 2022-04-27 DIAGNOSIS — G4700 Insomnia, unspecified: Secondary | ICD-10-CM

## 2022-05-03 ENCOUNTER — Other Ambulatory Visit: Payer: Self-pay | Admitting: Family Medicine

## 2022-05-03 DIAGNOSIS — J069 Acute upper respiratory infection, unspecified: Secondary | ICD-10-CM

## 2022-05-12 ENCOUNTER — Other Ambulatory Visit: Payer: Self-pay | Admitting: Family Medicine

## 2022-05-12 DIAGNOSIS — R600 Localized edema: Secondary | ICD-10-CM

## 2022-05-15 ENCOUNTER — Other Ambulatory Visit: Payer: Self-pay | Admitting: Family Medicine

## 2022-05-15 DIAGNOSIS — E785 Hyperlipidemia, unspecified: Secondary | ICD-10-CM

## 2022-05-16 ENCOUNTER — Other Ambulatory Visit: Payer: Self-pay | Admitting: Family Medicine

## 2022-05-23 ENCOUNTER — Other Ambulatory Visit (HOSPITAL_COMMUNITY): Payer: Self-pay

## 2022-05-23 MED ORDER — OXYCODONE-ACETAMINOPHEN 10-325 MG PO TABS
1.0000 | ORAL_TABLET | ORAL | 0 refills | Status: DC | PRN
  Filled 2022-05-23: qty 180, 30d supply, fill #0

## 2022-05-24 ENCOUNTER — Other Ambulatory Visit (HOSPITAL_COMMUNITY): Payer: Self-pay

## 2022-05-25 ENCOUNTER — Ambulatory Visit (INDEPENDENT_AMBULATORY_CARE_PROVIDER_SITE_OTHER): Payer: Medicare Other | Admitting: Pulmonary Disease

## 2022-05-25 ENCOUNTER — Encounter (HOSPITAL_BASED_OUTPATIENT_CLINIC_OR_DEPARTMENT_OTHER): Payer: Self-pay | Admitting: Pulmonary Disease

## 2022-05-25 VITALS — BP 118/64 | HR 71 | Ht 60.0 in | Wt 162.8 lb

## 2022-05-25 DIAGNOSIS — J449 Chronic obstructive pulmonary disease, unspecified: Secondary | ICD-10-CM

## 2022-05-25 DIAGNOSIS — R9389 Abnormal findings on diagnostic imaging of other specified body structures: Secondary | ICD-10-CM

## 2022-05-25 NOTE — Progress Notes (Signed)
Subjective:   PATIENT ID: Joan [name] GENDER: female DOB: 1946/06/17, MRN: [id number]   HPI  Chief Complaint  Patient presents with   Follow-up    Fluid build     Reason for Visit: Follow-up  [name] is a 76 year old female active smoker with COPD, pulmonary nodules, GERD, hypothyroidism, osteopenia, glaucoma, HTN, HLD hx PVC and chronic lymphedema who presents for follow-up  Initial consult Previously seen by Dr. Lamonte Sakai for initial consult for COPD, hypoxemia dx by her PCP and CT changes concerning for atypical infection  Denies shortness of breath, cough or wheezing. No limitations in activity except related to her knee problems. She has been on Trelegy but does not feel like it is effective. When her ICS strength was increased, she has had worsening congestion. She is also worried about the side effects related to Trelegy including glaucoma and steroids.  Reported hypoxemia with PCP. However ambulatory O2 in pulmonary with no desaturations on 11/07/21. She reports ONO in May 2023  She currently smokes 1/2 ppd. Previously 1 1/2 ppd. Started at 76 years old. ~ 50 years smoking.  05/25/22 Breo was ineffective. Now back on Trelegy now that she has assistance. 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  Past Medical History:  Diagnosis Date   Arthritis    "shoulders; wrist; probably spine" (06/24/2013)   Blood transfusion without reported diagnosis    Cancer ([place])    skin cancer right leg x 4     Cataract    removed both eyes    Chronic low back pain    followed by Dr Hardin Negus pain mgt   Colon polyp    Constipation    COPD (chronic obstructive pulmonary disease) (HCC)    mild   Esophageal stricture    Finger pain, left    2 fingers on left hand since wrist surgery   GERD (gastroesophageal reflux disease)    Glaucoma    Heart murmur    "slight; not on RX" (06/24/2013)    History of cardiac arrhythmia    cardiologist- Traci Turner   Hx of colonic polyps 08/28/2004   Hyperlipemia    Hypertension    Hypothyroidism    Osteoarthritis of left knee    advanced   PONV (postoperative nausea and vomiting)    Pt reports symptoms are the result of gallbladder and cholecystectomy, not anesthesia   PVC's (premature ventricular contractions)    Sleep apnea 1990's   "tested; tried mask; couldn't stand it; told me as long as I slept on my side I'd be ok" (06/24/2013)   Thyroid nodule    Tobacco abuse      Family History  Problem Relation Age of Onset   Pancreatic cancer Father    Cancer Father    Diabetes type II Mother    Hypertension Mother    Coronary artery disease Mother 20   Diabetes Mother    Thyroid cancer Mother    Skin cancer Mother    Diabetes Sister    Hypertension Sister    Bone cancer Sister    Breast cancer Sister    Hypertension Brother    Breast cancer Maternal Aunt    Other Neg Hx        No family history of  colon cancer   Colon cancer Neg Hx    Colon polyps Neg Hx    Esophageal cancer  Neg Hx    Stomach cancer Neg Hx    Rectal cancer Neg Hx      Social History   Occupational History   Not on file  Tobacco Use   Smoking status: Every Day    Packs/day: 1.50    Years: 50.00    Total pack years: 75.00    Types: Cigarettes   Smokeless tobacco: Never   Tobacco comments:    10 cigarettes smoked daily ARJ 11/07/21  Vaping Use   Vaping Use: Never used  Substance and Sexual Activity   Alcohol use: No    Alcohol/week: 0.0 standard drinks of alcohol   Drug use: No   Sexual activity: Not Currently    Allergies  Allergen Reactions   Morphine And Related Swelling   5-Alpha Reductase Inhibitors    Amitriptyline Swelling    Leg swelling   Augmentin [Amoxicillin-Pot Clavulanate] Nausea And Vomiting   Gabapentin Itching   Triamterene Itching and Rash     Outpatient Medications Prior to Visit  Medication Sig Dispense Refill    Acetaminophen (TYLENOL 8 HOUR ARTHRITIS PAIN PO) Take by mouth.     albuterol (VENTOLIN HFA) 108 (90 Base) MCG/ACT inhaler Inhale 2 puffs into the lungs every 6 (six) hours as needed for wheezing or shortness of breath. 6.7 g 2   bisacodyl (DULCOLAX) 5 MG EC tablet Take 5-10 mg by mouth daily as needed for mild constipation.  30 tablet    carisoprodol (SOMA) 350 MG tablet Take 175 mg by mouth 3 (three) times daily.      COVID-19 mRNA bivalent vaccine, Moderna, (MODERNA COVID-19 BIVAL BOOSTER) 50 MCG/0.5ML injection Inject into the muscle. 0.5 mL 0   diclofenac Sodium (VOLTAREN) 1 % GEL as needed.     dorzolamide-timolol (COSOPT) 22.3-6.8 MG/ML ophthalmic solution Place 1 drop into the left eye 2 (two) times daily.     estradiol (ESTRACE) 0.5 MG tablet Take 0.5 mg by mouth at bedtime.   4   fluocinonide ointment (LIDEX) 2.77 % Apply 1 application topically 2 (two) times daily.     fluticasone (FLONASE) 50 MCG/ACT nasal spray Place 2 sprays into both nostrils daily. 16 g 1   furosemide (LASIX) 40 MG tablet Take 2 tablets (80 mg total) by mouth daily. 60 tablet 0   ipratropium (ATROVENT) 0.06 % nasal spray Place 2 sprays into both nostrils 2 (two) times daily as needed for rhinitis. 15 mL 5   levothyroxine (SYNTHROID) 88 MCG tablet Take 1 tablet (88 mcg total) by mouth daily before breakfast. 90 tablet 3   NONFORMULARY OR COMPOUNDED ITEM Compression socks  20-30 mm/ hg   Dx low ext edema 1 each 1   omeprazole (PRILOSEC) 20 MG capsule Take 2 capsules by mouth once daily 180 capsule 1   oxyCODONE-acetaminophen (PERCOCET) 10-325 MG tablet Take 1 tablet by mouth every 4 (four) hours as needed for pain. 180 tablet 0   potassium chloride SA (KLOR-CON M) 20 MEQ tablet Take 4 tablets (80 mEq total) by mouth daily. 270 tablet 3   simvastatin (ZOCOR) 20 MG tablet Take 1 tablet (20 mg total) by mouth at bedtime. 90 tablet 0   spironolactone (ALDACTONE) 25 MG tablet Take 1 tablet (25 mg total) by mouth daily. 90  tablet 3   traZODone (DESYREL) 50 MG tablet TAKE 1 TABLET BY MOUTH AT BEDTIME 90 tablet 0   triamcinolone cream (KENALOG) 0.1 % Apply topically.     fluticasone-salmeterol (ADVAIR DISKUS) 250-50 MCG/ACT AEPB Inhale 1  puff into the lungs in the morning and at bedtime. 60 each 2   oxyCODONE-acetaminophen (PERCOCET) 10-325 MG tablet Take 1 tablet by mouth every 4 (four) hours as needed for pain.      No facility-administered medications prior to visit.    Review of Systems  Constitutional:  Negative for chills, diaphoresis, fever, malaise/fatigue and weight loss.  HENT:  Positive for congestion.   Respiratory:  Negative for cough, hemoptysis, sputum production, shortness of breath and wheezing.   Cardiovascular:  Negative for chest pain, palpitations and leg swelling.     Objective:   Vitals:   05/25/22 1402  BP: 118/64  Pulse: 71  SpO2: 97%  Weight: 162 lb 12.8 oz (73.8 kg)  Height: 5' (1.524 m)   SpO2: 97 % O2 Device: None (Room air)  Physical Exam: General: Well-appearing, no acute distress HENT: [place], AT Eyes: EOMI, no scleral icterus Respiratory: Clear to auscultation bilaterally.  No crackles, wheezing or rales Cardiovascular: RRR, -M/R/G, no JVD Extremities:-Edema,-tenderness Neuro: AAO x4, CNII-XII grossly intact Psych: Normal mood, normal affect  Data Reviewed:  Imaging: CT Chest 11/23/21 - New nodulr RUL consolidation. Peribronchovascular nodularity progressive from 04/18/20. Emphysema  CT Chest 02/26/22 - Resolved RML nodularity. Unchanged nodularity in RUL  PFT: 01/28/20 FVC 1.88 (80%) FEV1 1.32 (75%) Ratio 68  DLCO 76% Interpretation: Mild obstructive defect with mildly reduced DLCO  Labs: CBC    Component Value Date/Time   WBC 6.2 11/16/2021 1355   RBC 4.36 11/16/2021 1355   HGB 13.1 11/16/2021 1355   HGB 13.2 10/29/2019 1411   HCT 38.3 11/16/2021 1355   HCT 39.7 10/29/2019 1411   PLT 213.0 11/16/2021 1355   PLT 237 10/29/2019 1411   MCV 87.7  11/16/2021 1355   MCV 90 10/29/2019 1411   MCH 30.0 10/29/2019 1411   MCH 30.0 09/10/2019 0421   MCHC 34.2 11/16/2021 1355   RDW 13.8 11/16/2021 1355   RDW 12.7 10/29/2019 1411   LYMPHSABS 1.5 11/16/2021 1355   MONOABS 1.0 11/16/2021 1355   EOSABS 0.1 11/16/2021 1355   BASOSABS 0.1 11/16/2021 1355   Absolute eos 11/16/21 - 100  ONO 11/29/21 Duration 2:49:28    Assessment & Plan:   Discussion: 76 year old female active smoker with COPD, pulmonary nodules, GERD, hypothyroidism, osteopenia, glaucoma, HTN, HLD, hx PVC who presents for follow-up. Prior records with incomplete ONO. Stepping down to ICS/LABA was unsuccessful and patient now back on triple therapy with improved symptom relief. Previously discussed CT scan with waxing and waning nodules. Again discussed utility in bronchoscopy however reassured her than if symptoms were mild, reasonable to continue monitoring conservatively.  Emphysema/COPD, mild --CONTINUE Trelegy ONE puff ONCE a day  Hypoxemia --Ambulatory 11/07/21 with no desaturations --REQUEST ONO 11/29/21 [facility]. Prelim report scanned. Does not appear she needs O2  Abnormal CT suggestive MAC Waxing and waning pulmonary nodules, thought to be inflammatory --Reviewed CT 02/26/22 with resolved RML nodularity. Overall stable --ORDER CT Chest without contrast in 6 months. If stable, plan intervals to yearly  Nasal congestion --START atrovent TWO sprays per nostril in the morning. OK to use additional spray if needed --CONTINUE flonase TWO sprays per nostril at night  Health Maintenance Immunization History  Administered Date(s) Administered   Fluad Quad(high Dose 65+) 04/03/2019, 04/20/2020, 03/30/2022   Influenza Split 03/28/2012   Influenza Whole 04/28/2007, 05/25/2008, 04/19/2009, 04/18/2010   Influenza, High Dose Seasonal PF 04/13/2014, 04/04/2015, 04/30/2016, 03/14/2017, 04/29/2018   Influenza,inj,Quad PF,6+ Mos 03/06/2013   Influenza,inj,quad, With Preservative  04/08/2017   Influenza-Unspecified 04/08/2018, 09/30/2021   Moderna Covid-19 Vaccine Bivalent Booster 8yr & up 11/16/2021   Moderna Sars-Covid-2 Vaccination 10/19/2019, 11/16/2019, 05/31/2020   Pneumococcal Conjugate-13 06/07/2014   Pneumococcal Polysaccharide-23 08/12/2007, 10/18/2015   Td 10/23/2004, 09/08/2014   Td (Adult), 2 Lf Tetanus Toxid, Preservative Free 10/23/2004, 09/08/2014   Zoster, Live 03/28/2012   CT Lung Screen - qualified however under surveillance imaging.  Orders Placed This Encounter  Procedures   CT Chest Wo Contrast    Standing Status:   Future    Standing Expiration Date:   05/26/2023    Scheduling Instructions:     To be completed before March 2024 ov    Order Specific Question:   Preferred imaging location?    Answer:   [facility]   No orders of the defined types were placed in this encounter.   No follow-ups on file. March 2024  I have spent a total time of 33-minutes on the day of the appointment including chart review, data review, collecting history, coordinating care and discussing medical diagnosis and plan with the patient/family. Past medical history, allergies, medications were reviewed. Pertinent imaging, labs and tests included in this note have been reviewed and interpreted independently by me.  CFirth MD [facility] 05/25/2022 2:41 PM  Office Number [phone]

## 2022-05-25 NOTE — Patient Instructions (Signed)
Emphysema/COPD, mild --CONTINUE Trelegy ONE puff ONCE a day  Hypoxemia --Ambulatory 11/07/21 with no desaturations --REQUEST ONO 11/29/21 [facility]. Prelim report scanned. Does not appear she needs O2  Abnormal CT suggestive MAC Waxing and waning pulmonary nodules, thought to be inflammatory --Reviewed CT 02/26/22 with resolved RML nodularity. Overall stable --ORDER CT Chest without contrast in March 2024. If stable, plan intervals to yearly  Please schedule CT for March 2024 per patient preference Will need follow-up in clinic with me after CT scan

## 2022-05-26 DIAGNOSIS — R0902 Hypoxemia: Secondary | ICD-10-CM | POA: Diagnosis not present

## 2022-05-26 DIAGNOSIS — J449 Chronic obstructive pulmonary disease, unspecified: Secondary | ICD-10-CM | POA: Diagnosis not present

## 2022-05-28 DIAGNOSIS — M1712 Unilateral primary osteoarthritis, left knee: Secondary | ICD-10-CM | POA: Diagnosis not present

## 2022-06-04 DIAGNOSIS — M1712 Unilateral primary osteoarthritis, left knee: Secondary | ICD-10-CM | POA: Diagnosis not present

## 2022-06-07 ENCOUNTER — Ambulatory Visit: Payer: Self-pay | Admitting: Licensed Clinical Social Worker

## 2022-06-07 NOTE — Patient Outreach (Signed)
  Care Coordination   Initial Visit Note   06/07/2022 Name: SAMIE BARCLIFT MRN: [id number] DOB: 1946/01/20  LIVY ROSS is a 76 y.o. year old female who sees Carollee Herter, Alferd Apa, DO for primary care. I spoke with  [name] by phone today.  What matters to the patients health and wellness today?    Patient reports no concerns or needs from Care Coordination team with health and wellness related to physical or mental heath. .    Goals Addressed             This Visit's Progress    COMPLETED: Care Coordination Activies No Follow up Requires       Care Coordination Interventions: Reviewed Care Coordination Services:Not interested             SDOH assessments and interventions completed:  No    Care Coordination Interventions:  Yes, provided   Follow up plan: No further intervention required.   Encounter Outcome:  Pt. Visit Completed   Casimer Lanius, [place] [phone]

## 2022-06-07 NOTE — Patient Instructions (Signed)
Visit Information  Thank you for taking time to visit with me today. Please don't hesitate to contact me if I can be of assistance to you.   Following are the goals we discussed today:   Goals Addressed             This Visit's Progress    COMPLETED: Care Coordination Activies No Follow up Requires       Care Coordination Interventions: Reviewed Care Coordination Services:Not interested                Patient verbalizes understanding of instructions and care plan provided today and agrees to view in MyChart. Active MyChart status and patient understanding of how to access instructions and care plan via MyChart confirmed with patient.     No further follow up required: By [place] provides support specific to your health needs that extend beyond exceptional routine office care you already receive from your primary care doctor.    If you are eligible for standard Care Coordination, there is no cost to you.  The Care Coordination team is made up of the following team members: Registered Nurse Care Guide: disease management, health education, care coordination and complex case management Clinical Social Work: Complex Care Coordination including coordination of level of care needs, mental and behavioral health assessment and recommendations, and connection to long-term mental health support Clinical Pharmacist: medication management, assistance and disease management Community Resource Care Guides: Forensic psychologist Team: dedicated team of scheduling professionals to support patient and clinical team scheduling needs  Please call [phone] if you would like to schedule a phone appointment with one of the team members.    Casimer Lanius, [place] [phone]

## 2022-06-11 ENCOUNTER — Encounter (HOSPITAL_BASED_OUTPATIENT_CLINIC_OR_DEPARTMENT_OTHER): Payer: Self-pay | Admitting: Pulmonary Disease

## 2022-06-11 DIAGNOSIS — M1712 Unilateral primary osteoarthritis, left knee: Secondary | ICD-10-CM | POA: Diagnosis not present

## 2022-06-13 ENCOUNTER — Other Ambulatory Visit: Payer: Self-pay | Admitting: Family Medicine

## 2022-06-13 DIAGNOSIS — R600 Localized edema: Secondary | ICD-10-CM

## 2022-06-15 ENCOUNTER — Telehealth: Payer: Self-pay | Admitting: Family Medicine

## 2022-06-15 DIAGNOSIS — E785 Hyperlipidemia, unspecified: Secondary | ICD-10-CM

## 2022-06-15 MED ORDER — SIMVASTATIN 20 MG PO TABS: 20.0000 mg | ORAL_TABLET | Freq: Every day | ORAL | 0 refills | Status: DC

## 2022-06-15 NOTE — Telephone Encounter (Signed)
Patient called to advise she tried to get a refill on her  but it was denied. Advised that she needed to make an appt before future refills would be given per last refill. Patient scheduled for Monday but said she takes her last pill today and won't have anymore pills this weekend. Please advise if she can get another refill.

## 2022-06-15 NOTE — Telephone Encounter (Signed)
Which medication ?

## 2022-06-15 NOTE — Telephone Encounter (Signed)
Which pharmacy?

## 2022-06-15 NOTE — Telephone Encounter (Signed)
[place], [place] - [address] [place], [place] [address] Phone: [phone]  Fax: [phone]

## 2022-06-15 NOTE — Telephone Encounter (Signed)
Rx sent 

## 2022-06-15 NOTE — Telephone Encounter (Signed)
simvastatin (ZOCOR) 20 MG tablet

## 2022-06-16 ENCOUNTER — Other Ambulatory Visit: Payer: Self-pay | Admitting: Family Medicine

## 2022-06-16 DIAGNOSIS — R600 Localized edema: Secondary | ICD-10-CM

## 2022-06-18 ENCOUNTER — Ambulatory Visit (INDEPENDENT_AMBULATORY_CARE_PROVIDER_SITE_OTHER): Payer: Medicare Other | Admitting: Family Medicine

## 2022-06-18 ENCOUNTER — Encounter: Payer: Self-pay | Admitting: Family Medicine

## 2022-06-18 VITALS — BP 112/70 | HR 83 | Temp 98.2°F | Resp 12 | Ht 60.0 in | Wt 177.8 lb

## 2022-06-18 DIAGNOSIS — L03119 Cellulitis of unspecified part of limb: Secondary | ICD-10-CM | POA: Diagnosis not present

## 2022-06-18 DIAGNOSIS — E876 Hypokalemia: Secondary | ICD-10-CM

## 2022-06-18 DIAGNOSIS — I10 Essential (primary) hypertension: Secondary | ICD-10-CM | POA: Diagnosis not present

## 2022-06-18 DIAGNOSIS — I890 Lymphedema, not elsewhere classified: Secondary | ICD-10-CM

## 2022-06-18 DIAGNOSIS — R600 Localized edema: Secondary | ICD-10-CM | POA: Diagnosis not present

## 2022-06-18 DIAGNOSIS — E785 Hyperlipidemia, unspecified: Secondary | ICD-10-CM

## 2022-06-18 MED ORDER — FUROSEMIDE 40 MG PO TABS: ORAL_TABLET | ORAL | 1 refills | Status: DC

## 2022-06-18 MED ORDER — CEPHALEXIN 500 MG PO CAPS: 500.0000 mg | ORAL_CAPSULE | Freq: Two times a day (BID) | ORAL | 0 refills | Status: DC

## 2022-06-18 NOTE — Patient Instructions (Signed)
Edema  Edema is an abnormal buildup of fluids in the body tissues and under the skin. Swelling of the legs, feet, and ankles is a common symptom that becomes more likely as you get older. Swelling is also common in looser tissues, such as around the eyes. Pressing on the area may make a temporary dent in your skin (pitting edema). This fluid may also accumulate in your lungs (pulmonary edema). There are many possible causes of edema. Eating too much salt (sodium) and being on your feet or sitting for a long time can cause edema in your legs, feet, and ankles. Common causes of edema include: Certain medical conditions, such as heart failure, liver or kidney disease, and cancer. Weak leg blood vessels. An injury. Pregnancy. Medicines. Being obese. Low protein levels in the blood. Hot weather may make edema worse. Edema is usually painless. Your skin may look swollen or shiny. Follow these instructions at home: Medicines Take over-the-counter and prescription medicines only as told by your health care provider. Your health care provider may prescribe a medicine to help your body get rid of extra water (diuretic). Take this medicine if you are told to take it. Eating and drinking Eat a low-salt (low-sodium) diet to reduce fluid as told by your health care provider. Sometimes, eating less salt may reduce swelling. Depending on the cause of your swelling, you may need to limit how much fluid you drink (fluid restriction). General instructions Raise (elevate) the injured area above the level of your heart while you are sitting or lying down. Do not sit still or stand for long periods of time. Do not wear tight clothing. Do not wear garters on your upper legs. Exercise your legs to get your circulation going. This helps to move the fluid back into your blood vessels, and it may help the swelling go down. Wear compression stockings as told by your health care provider. These stockings help to prevent  blood clots and reduce swelling in your legs. It is important that these are the correct size. These stockings should be prescribed by your health care provider to prevent possible injuries. If elastic bandages or wraps are recommended, use them as told by your health care provider. Contact a health care provider if: Your edema does not get better with treatment. You have heart, liver, or kidney disease and have symptoms of edema. You have sudden and unexplained weight gain. Get help right away if: You develop shortness of breath or chest pain. You cannot breathe when you lie down. You develop pain, redness, or warmth in the swollen areas. You have heart, liver, or kidney disease and suddenly get edema. You have a fever and your symptoms suddenly get worse. These symptoms may be an emergency. Get help right away. Call 911. Do not wait to see if the symptoms will go away. Do not drive yourself to the hospital. Summary Edema is an abnormal buildup of fluids in the body tissues and under the skin. Eating too much salt (sodium)and being on your feet or sitting for a long time can cause edema in your legs, feet, and ankles. Raise (elevate) the injured area above the level of your heart while you are sitting or lying down. Follow your health care provider's instructions about diet and how much fluid you can drink. This information is not intended to replace advice given to you by your health care provider. Make sure you discuss any questions you have with your health care provider. Document Revised: 02/27/2021 Document   Reviewed: 02/27/2021 Elsevier Patient Education  2023 [employer]  

## 2022-06-18 NOTE — Progress Notes (Addendum)
Subjective:   By signing my name below, I, Carylon Perches, attest that this documentation has been prepared under the direction and in the presence of [name] DO 06/18/2022   Patient ID: Joan [name], female    DOB: 12/12/1945, 76 y.o.   MRN: [id number]  Chief Complaint  Patient presents with   Medication Refill   fluid  in both legs    HPI Patient is in today for an office visit  She is requesting a refill of 40 mg of Lasix.   She states that her pharmacy informed her that her Lasix medication was declined by this office. She states that her leg was swollen to the point where fluid started to leak from it. She also states that when she lays back, her swelling used to decrease but not anymore.  She is requesting a prescription of Cephalexin for her medication for her symptoms. She was referred to a clinic for possible lymphedema and has not heard back from the clinic since June/July.   She states that she is taking Trelegy Ellipta again. She was able to get assistance for the medication and states that she has a supply until 08/2022.   She states that she twisted her left knee about two months ago. She has since finished about about 3 weeks of gel injections which has been alleviating her symptoms. She states that due to her knee injury, she has not been able to exercise as much.  Wt Readings from Last 3 Encounters:  06/18/22 177 lb 12.8 oz (80.6 kg)  05/25/22 162 lb 12.8 oz (73.8 kg)  03/30/22 171 lb 3.2 oz (77.7 kg)   Past Medical History:  Diagnosis Date   Arthritis    "shoulders; wrist; probably spine" (06/24/2013)   Blood transfusion without reported diagnosis    Cancer ([place])    skin cancer right leg x 4     Cataract    removed both eyes    Chronic low back pain    followed by Dr Hardin Negus pain mgt   Colon polyp    Constipation    COPD (chronic obstructive pulmonary disease) (HCC)    mild   Esophageal stricture    Finger pain, left    2 fingers on  left hand since wrist surgery   GERD (gastroesophageal reflux disease)    Glaucoma    Heart murmur    "slight; not on RX" (06/24/2013)   History of cardiac arrhythmia    cardiologist- Traci Turner   Hx of colonic polyps 08/28/2004   Hyperlipemia    Hypertension    Hypothyroidism    Osteoarthritis of left knee    advanced   PONV (postoperative nausea and vomiting)    Pt reports symptoms are the result of gallbladder and cholecystectomy, not anesthesia   PVC's (premature ventricular contractions)    Sleep apnea 1990's   "tested; tried mask; couldn't stand it; told me as long as I slept on my side I'd be ok" (06/24/2013)   Thyroid nodule    Tobacco abuse     Past Surgical History:  Procedure Laterality Date   ABDOMINAL HYSTERECTOMY  1988   "partial" (06/24/2013)   ABDOMINAL HYSTERECTOMY     partial in [place]   "tendon repair" (06/24/2013)   [place]     "think today was my 8th back OR" (06/24/2013)   CERVICAL SPINE SURGERY  2012   CESAREAN SECTION  1972   CHOLECYSTECTOMY N/A  04/16/2013   Procedure: LAPAROSCOPIC CHOLECYSTECTOMY ;  Surgeon: Imogene Burn. Tsuei, MD;  Location: [facility];  Service: General;  Laterality: N/A;   COLONOSCOPY     ELBOW SURGERY  1990's   FOOT SURGERY Right 2012   SPUR REMOVED   INFUSION PUMP IMPLANTATION  1990's   "implantablet morphine pump; took it out w/in 11 months   KNEE ARTHROSCOPY Left 1991; ~ [place] N/A 04/16/2013   Procedure: LAPAROSCOPIC LYSIS OF ADHESIONS;  Surgeon: Imogene Burn. Georgette Dover, MD;  Location: [facility];  Service: General;  Laterality: N/A;   LUMBAR FUSION     and rods   LUMBAR LAMINECTOMY/DECOMPRESSION MICRODISCECTOMY N/A 11/28/2012   Procedure: DECOMPRESSIVE LUMBAR LAMINECTOMY LEVEL 1;  Surgeon: Elaina Hoops, MD;  Location: [facility];  Service: Neurosurgery;  Laterality: N/A;  DECOMPRESSIVE LUMBAR LAMINECTOMY LEVEL 1   ORIF DISTAL RADIUS FRACTURE Left 12/30/2013   dr Caralyn Guile    ORIF WRIST FRACTURE Left 12/30/2013   Procedure: OPEN REDUCTION INTERNAL FIXATION (ORIF) LEFT WRIST FRACTURE AND REPAIR AS INDICATED;  Surgeon: Linna Hoff, MD;  Location: [facility];  Service: Orthopedics;  Laterality: Left;   POLYPECTOMY     POSTERIOR FUSION LUMBAR SPINE  06/24/2013   ROBOTIC ASSISTED SALPINGO OOPHERECTOMY Bilateral 08/20/2014   Procedure: ROBOTIC ASSISTED SALPINGO OOPHORECTOMY;  Surgeon: Daria Pastures, MD;  Location: [facility];  Service: Gynecology;  Laterality: Bilateral;   SHOULDER ARTHROSCOPY Left 09/2011   THYROIDECTOMY  2012   TOTAL HIP ARTHROPLASTY Left 04/27/2019   Procedure: TOTAL HIP ARTHROPLASTY ANTERIOR APPROACH;  Surgeon: Gaynelle Arabian, MD;  Location: [facility];  Service: Orthopedics;  Laterality: Left;  141mn   TOTAL HIP ARTHROPLASTY Right 09/09/2019   Procedure: TOTAL HIP ARTHROPLASTY ANTERIOR APPROACH;  Surgeon: AGaynelle Arabian MD;  Location: [facility];  Service: Orthopedics;  Laterality: Right;  1055m   TOTAL KNEE ARTHROPLASTY Right 09/09/2017   Procedure: RIGHT TOTAL KNEE ARTHROPLASTY;  Surgeon: AlGaynelle ArabianMD;  Location: [facility];  Service: Orthopedics;  Laterality: Right;   UPPER GASTROINTESTINAL ENDOSCOPY     VITRECTOMY Right 10/18/2020    Family History  Problem Relation Age of Onset   Pancreatic cancer Father    Cancer Father    Diabetes type II Mother    Hypertension Mother    Coronary artery disease Mother [id number] Diabetes Mother    Thyroid cancer Mother    Skin cancer Mother    Diabetes Sister    Hypertension Sister    Bone cancer Sister    Breast cancer Sister    Hypertension Brother    Breast cancer Maternal Aunt    Other Neg Hx        No family history of  colon cancer   Colon cancer Neg Hx    Colon polyps Neg Hx    Esophageal cancer Neg Hx    Stomach cancer Neg Hx    Rectal cancer Neg Hx     Social History   Socioeconomic History   Marital status: Divorced    Spouse name: Not on file   Number of children: Not on file   Years of  education: Not on file   Highest education level: Not on file  Occupational History   Not on file  Tobacco Use   Smoking status: Every Day    Packs/day: 1.50    Years: 50.00    Total pack years: 75.00    Types: Cigarettes   Smokeless tobacco: Never   Tobacco  comments:    10 cigarettes smoked daily ARJ 11/07/21  Vaping Use   Vaping Use: Never used  Substance and Sexual Activity   Alcohol use: No    Alcohol/week: 0.0 standard drinks of alcohol   Drug use: No   Sexual activity: Not Currently  Other Topics Concern   Not on file  Social History Narrative   Divorced   Current Smoker  1 ppd -  20 yrs      Alcohol use-no       [employer] - laid off       Physician roster:   Dr. Elta Guadeloupe Philips - pain management   Dr. Philis Pique - GYN   Dr. Saintclair Halsted - Neurosurgery   Dr. Ronnald Ramp - dermatology   Dr. Caralyn Guile - orthopedics   Dr. Fransico Him - cardiology   Dr. Miller-ophthalmology   Social Determinants of Health   Financial Resource Strain: Low Risk  (11/16/2021)   Overall Financial Resource Strain (CARDIA)    Difficulty of Paying Living Expenses: Not hard at all  Food Insecurity: No Food Insecurity (11/16/2021)   Hunger Vital Sign    Worried About Running Out of Food in the Last Year: Never true    [place] in the Last Year: Never true  Transportation Needs: No Transportation Needs (11/16/2021)   PRAPARE - Hydrologist (Medical): No    Lack of Transportation (Non-Medical): No  Physical Activity: Inactive (11/16/2021)   Exercise Vital Sign    Days of Exercise per Week: 0 days    Minutes of Exercise per Session: 0 min  Stress: No Stress Concern Present (11/16/2021)   [place]    Feeling of Stress : Not at all  Social Connections: Socially Isolated (11/16/2021)   Social Connection and Isolation Panel [NHANES]    Frequency of Communication with Friends and Family: Twice a  week    Frequency of Social Gatherings with Friends and Family: Once a week    Attends Religious Services: Never    Marine scientist or Organizations: No    Attends Archivist Meetings: Never    Marital Status: Divorced  Human resources officer Violence: Not At Risk (11/16/2021)   Humiliation, Afraid, Rape, and Kick questionnaire    Fear of Current or Ex-Partner: No    Emotionally Abused: No    Physically Abused: No    Sexually Abused: No    Outpatient Medications Prior to Visit  Medication Sig Dispense Refill   Acetaminophen (TYLENOL 8 HOUR ARTHRITIS PAIN PO) Take by mouth.     albuterol (VENTOLIN HFA) 108 (90 Base) MCG/ACT inhaler Inhale 2 puffs into the lungs every 6 (six) hours as needed for wheezing or shortness of breath. 6.7 g 2   bisacodyl (DULCOLAX) 5 MG EC tablet Take 5-10 mg by mouth daily as needed for mild constipation.  30 tablet    carisoprodol (SOMA) 350 MG tablet Take 175 mg by mouth 3 (three) times daily.      COVID-19 mRNA bivalent vaccine, Moderna, (MODERNA COVID-19 BIVAL BOOSTER) 50 MCG/0.5ML injection Inject into the muscle. 0.5 mL 0   diclofenac Sodium (VOLTAREN) 1 % GEL as needed.     dorzolamide-timolol (COSOPT) 22.3-6.8 MG/ML ophthalmic solution Place 1 drop into the left eye 2 (two) times daily.     estradiol (ESTRACE) 0.5 MG tablet Take 0.5 mg by mouth at bedtime.   4   fluocinonide ointment (LIDEX) 0.05 %  Apply 1 application topically 2 (two) times daily.     fluticasone (FLONASE) 50 MCG/ACT nasal spray Place 2 sprays into both nostrils daily. 16 g 1   ipratropium (ATROVENT) 0.06 % nasal spray Place 2 sprays into both nostrils 2 (two) times daily as needed for rhinitis. 15 mL 5   levothyroxine (SYNTHROID) 88 MCG tablet Take 1 tablet (88 mcg total) by mouth daily before breakfast. 90 tablet 3   NONFORMULARY OR COMPOUNDED ITEM Compression socks  20-30 mm/ hg   Dx low ext edema 1 each 1   omeprazole (PRILOSEC) 20 MG capsule Take 2 capsules by mouth once  daily 180 capsule 1   oxyCODONE-acetaminophen (PERCOCET) 10-325 MG tablet Take 1 tablet by mouth every 4 (four) hours as needed for pain. 180 tablet 0   potassium chloride SA (KLOR-CON M) 20 MEQ tablet Take 4 tablets (80 mEq total) by mouth daily. 270 tablet 3   simvastatin (ZOCOR) 20 MG tablet Take 1 tablet (20 mg total) by mouth at bedtime. 30 tablet 0   spironolactone (ALDACTONE) 25 MG tablet Take 1 tablet (25 mg total) by mouth daily. 90 tablet 3   traZODone (DESYREL) 50 MG tablet TAKE 1 TABLET BY MOUTH AT BEDTIME 90 tablet 0   TRELEGY ELLIPTA 100-62.5-25 MCG/ACT AEPB Inhale 1 puff into the lungs daily.     triamcinolone cream (KENALOG) 0.1 % Apply topically.     furosemide (LASIX) 40 MG tablet TAKE 2 TABLETS BY MOUTH ONCE DAILY . APPOINTMENT REQUIRED FOR FUTURE REFILLS 60 tablet 0   No facility-administered medications prior to visit.    Allergies  Allergen Reactions   Morphine And Related Swelling   5-Alpha Reductase Inhibitors    Amitriptyline Swelling    Leg swelling   Augmentin [Amoxicillin-Pot Clavulanate] Nausea And Vomiting   Gabapentin Itching   Triamterene Itching and Rash    Review of Systems  Constitutional:  Negative for fever and malaise/fatigue.  HENT:  Negative for congestion.   Eyes:  Negative for blurred vision.  Respiratory:  Negative for cough and shortness of breath.   Cardiovascular:  Positive for leg swelling. Negative for chest pain and palpitations.  Gastrointestinal:  Negative for vomiting.  Musculoskeletal:  Negative for back pain.  Skin:  Negative for rash.  Neurological:  Negative for loss of consciousness and headaches.       Objective:    Physical Exam Vitals and nursing note reviewed.  Constitutional:      General: She is not in acute distress.    Appearance: Normal appearance. She is not ill-appearing.  HENT:     Head: Normocephalic and atraumatic.     Right Ear: External ear normal.     Left Ear: External ear normal.  Eyes:      Extraocular Movements: Extraocular movements intact.     Pupils: Pupils are equal, round, and reactive to light.  Cardiovascular:     Rate and Rhythm: Normal rate and regular rhythm.     Heart sounds: Normal heart sounds. No murmur heard.    No gallop.  Pulmonary:     Effort: Pulmonary effort is normal. No respiratory distress.     Breath sounds: Normal breath sounds. No wheezing or rales.  Musculoskeletal:     Right lower leg: 4+ Edema present.     Left lower leg: 4+ Edema present.  Skin:    General: Skin is warm and dry.  Neurological:     Mental Status: She is alert and oriented to person, place,  and time.  Psychiatric:        Judgment: Judgment normal.     BP 112/70 (BP Location: Left Arm, Cuff Size: Large)   Pulse 83   Temp 98.2 F (36.8 C) (Oral)   Resp 12   Ht 5' (1.524 m)   Wt 177 lb 12.8 oz (80.6 kg)   SpO2 98%   BMI 34.72 kg/m  Wt Readings from Last 3 Encounters:  06/18/22 177 lb 12.8 oz (80.6 kg)  05/25/22 162 lb 12.8 oz (73.8 kg)  03/30/22 171 lb 3.2 oz (77.7 kg)    Diabetic Foot Exam - Simple   No data filed    Lab Results  Component Value Date   WBC 6.2 11/16/2021   HGB 13.1 11/16/2021   HCT 38.3 11/16/2021   PLT 213.0 11/16/2021   GLUCOSE 117 (H) 01/03/2022   CHOL 180 11/16/2021   TRIG 185.0 (H) 11/16/2021   HDL 59.70 11/16/2021   LDLDIRECT 98.0 10/17/2021   LDLCALC 84 11/16/2021   ALT 12 11/16/2021   AST 17 11/16/2021   NA 141 01/03/2022   K 4.2 01/03/2022   CL 101 01/03/2022   CREATININE 0.95 01/03/2022   BUN 13 01/03/2022   CO2 25 01/03/2022   TSH 1.35 11/16/2021   INR 1.0 09/03/2019   HGBA1C 6.5 11/10/2020    Lab Results  Component Value Date   TSH 1.35 11/16/2021   Lab Results  Component Value Date   WBC 6.2 11/16/2021   HGB 13.1 11/16/2021   HCT 38.3 11/16/2021   MCV 87.7 11/16/2021   PLT 213.0 11/16/2021   Lab Results  Component Value Date   NA 141 01/03/2022   K 4.2 01/03/2022   CO2 25 01/03/2022   GLUCOSE 117  (H) 01/03/2022   BUN 13 01/03/2022   CREATININE 0.95 01/03/2022   BILITOT 0.5 11/16/2021   ALKPHOS 92 11/16/2021   AST 17 11/16/2021   ALT 12 11/16/2021   PROT 6.8 11/16/2021   ALBUMIN 4.4 11/16/2021   CALCIUM 8.6 (L) 01/03/2022   ANIONGAP 6 09/11/2019   EGFR 62 01/03/2022   GFR 68.19 11/16/2021   Lab Results  Component Value Date   CHOL 180 11/16/2021   Lab Results  Component Value Date   HDL 59.70 11/16/2021   Lab Results  Component Value Date   LDLCALC 84 11/16/2021   Lab Results  Component Value Date   TRIG 185.0 (H) 11/16/2021   Lab Results  Component Value Date   CHOLHDL 3 11/16/2021   Lab Results  Component Value Date   HGBA1C 6.5 11/10/2020       Assessment & Plan:   Problem List Items Addressed This Visit       Unprioritized   Primary hypertension   Relevant Medications   furosemide (LASIX) 40 MG tablet   Other Relevant Orders   CBC with Differential/Platelet   Comprehensive metabolic panel   Lipid panel   TSH   Lower extremity edema   Relevant Medications   furosemide (LASIX) 40 MG tablet   Other Relevant Orders   Ambulatory referral to Physical Therapy   Hypokalemia   Relevant Orders   CBC with Differential/Platelet   Comprehensive metabolic panel   Lipid panel   TSH   Hyperlipidemia   Relevant Medications   furosemide (LASIX) 40 MG tablet   Other Relevant Orders   CBC with Differential/Platelet   Comprehensive metabolic panel   Lipid panel   TSH   Cellulitis of lower extremity -  Primary    Due to inc in swelling  Elevate legs Compression  Refill lasix  Lymphedema clinic       Relevant Medications   cephALEXin (KEFLEX) 500 MG capsule   Other Visit Diagnoses     Lymphedema       Relevant Orders   Ambulatory referral to Physical Therapy      Meds ordered this encounter  Medications   furosemide (LASIX) 40 MG tablet    Sig: TAKE 2 TABLETS BY MOUTH ONCE DAILY .    Dispense:  180 tablet    Refill:  1    Please  fill for 180 and d/c 60 count   cephALEXin (KEFLEX) 500 MG capsule    Sig: Take 1 capsule (500 mg total) by mouth 2 (two) times daily.    Dispense:  20 capsule    Refill:  0    I, [name], DO, personally preformed the services described in this documentation.  All medical record entries made by the scribe were at my direction and in my presence.  I have reviewed the chart and discharge instructions (if applicable) and agree that the record reflects my personal performance and is accurate and complete. 06/18/2022   I,Amber Collins,acting as a scribe for [name], DO.,have documented all relevant documentation on the behalf of [name], DO,as directed by  [name], DO while in the presence of [name], DO.    [name], DO

## 2022-06-18 NOTE — Assessment & Plan Note (Signed)
Due to inc in swelling  Elevate legs Compression  Refill lasix  Lymphedema clinic

## 2022-06-19 LAB — COMPREHENSIVE METABOLIC PANEL
ALT: 12 U/L (ref 0–35)
AST: 15 U/L (ref 0–37)
Albumin: 4.3 g/dL (ref 3.5–5.2)
Alkaline Phosphatase: 117 U/L (ref 39–117)
BUN: 15 mg/dL (ref 6–23)
CO2: 31 mEq/L (ref 19–32)
Calcium: 8.9 mg/dL (ref 8.4–10.5)
Chloride: 97 mEq/L (ref 96–112)
Creatinine, Ser: 0.84 mg/dL (ref 0.40–1.20)
GFR: 66.94 mL/min (ref >60.00)
Glucose, Bld: 116 mg/dL — ABNORMAL HIGH (ref 70–99)
Potassium: 4.1 mEq/L (ref 3.5–5.1)
Sodium: 137 mEq/L (ref 135–145)
Total Bilirubin: 0.4 mg/dL (ref 0.2–1.2)
Total Protein: 6.7 g/dL (ref 6.0–8.3)

## 2022-06-19 LAB — CBC WITH DIFFERENTIAL/PLATELET
Basophils Absolute: 0.1 10*3/uL (ref 0.0–0.1)
Basophils Relative: 0.9 % (ref 0.0–3.0)
Eosinophils Absolute: 0.1 10*3/uL (ref 0.0–0.7)
Eosinophils Relative: 1 % (ref 0.0–5.0)
HCT: 40.1 % (ref 36.0–46.0)
Hemoglobin: 13.6 g/dL (ref 12.0–15.0)
Lymphocytes Relative: 23.5 % (ref 12.0–46.0)
Lymphs Abs: 1.7 10*3/uL (ref 0.7–4.0)
MCHC: 33.9 g/dL (ref 30.0–36.0)
MCV: 88.8 fl (ref 78.0–100.0)
Monocytes Absolute: 1 10*3/uL (ref 0.1–1.0)
Monocytes Relative: 13.6 % — ABNORMAL HIGH (ref 3.0–12.0)
Neutro Abs: 4.5 10*3/uL (ref 1.4–7.7)
Neutrophils Relative %: 61 % (ref 43.0–77.0)
Platelets: 230 10*3/uL (ref 150.0–400.0)
RBC: 4.51 Mil/uL (ref 3.87–5.11)
RDW: 14.2 % (ref 11.5–15.5)
WBC: 7.3 10*3/uL (ref 4.0–10.5)

## 2022-06-19 LAB — TSH: TSH: 1.38 u[IU]/mL (ref 0.35–5.50)

## 2022-06-19 LAB — LIPID PANEL
Cholesterol: 197 mg/dL (ref 0–200)
HDL: 64.5 mg/dL (ref >39.00)
LDL Cholesterol: 93 mg/dL (ref 0–99)
NonHDL: 132.82
Total CHOL/HDL Ratio: 3
Triglycerides: 197 mg/dL — ABNORMAL HIGH (ref 0.0–149.0)
VLDL: 39.4 mg/dL (ref 0.0–40.0)

## 2022-06-21 ENCOUNTER — Other Ambulatory Visit (HOSPITAL_COMMUNITY): Payer: Self-pay

## 2022-06-21 ENCOUNTER — Ambulatory Visit: Payer: Medicare Other | Admitting: Cardiovascular Disease

## 2022-06-21 DIAGNOSIS — M62830 Muscle spasm of back: Secondary | ICD-10-CM | POA: Diagnosis not present

## 2022-06-21 DIAGNOSIS — G894 Chronic pain syndrome: Secondary | ICD-10-CM | POA: Diagnosis not present

## 2022-06-21 DIAGNOSIS — Z79891 Long term (current) use of opiate analgesic: Secondary | ICD-10-CM | POA: Diagnosis not present

## 2022-06-21 DIAGNOSIS — M961 Postlaminectomy syndrome, not elsewhere classified: Secondary | ICD-10-CM | POA: Diagnosis not present

## 2022-06-21 MED ORDER — OXYCODONE-ACETAMINOPHEN 10-325 MG PO TABS: 1.0000 | ORAL_TABLET | ORAL | 0 refills | Status: DC | PRN

## 2022-06-21 MED ORDER — OXYCODONE-ACETAMINOPHEN 10-325 MG PO TABS
1.0000 | ORAL_TABLET | ORAL | 0 refills | Status: DC | PRN
  Filled 2022-06-22: qty 180, 30d supply, fill #0

## 2022-06-22 ENCOUNTER — Other Ambulatory Visit: Payer: Self-pay

## 2022-06-22 ENCOUNTER — Other Ambulatory Visit (HOSPITAL_COMMUNITY): Payer: Self-pay

## 2022-06-22 DIAGNOSIS — G894 Chronic pain syndrome: Secondary | ICD-10-CM | POA: Diagnosis not present

## 2022-06-22 DIAGNOSIS — Z79891 Long term (current) use of opiate analgesic: Secondary | ICD-10-CM | POA: Diagnosis not present

## 2022-06-25 DIAGNOSIS — J449 Chronic obstructive pulmonary disease, unspecified: Secondary | ICD-10-CM | POA: Diagnosis not present

## 2022-06-25 DIAGNOSIS — R0902 Hypoxemia: Secondary | ICD-10-CM | POA: Diagnosis not present

## 2022-06-27 DIAGNOSIS — L308 Other specified dermatitis: Secondary | ICD-10-CM | POA: Diagnosis not present

## 2022-06-27 DIAGNOSIS — I872 Venous insufficiency (chronic) (peripheral): Secondary | ICD-10-CM | POA: Diagnosis not present

## 2022-07-10 ENCOUNTER — Ambulatory Visit
Admission: RE | Admit: 2022-07-10 | Discharge: 2022-07-10 | Disposition: A | Discharge status: Home / Self Care | Payer: Medicare Other | Source: Ambulatory Visit | Attending: Family Medicine | Admitting: Family Medicine

## 2022-07-10 DIAGNOSIS — Z1231 Encounter for screening mammogram for malignant neoplasm of breast: Secondary | ICD-10-CM | POA: Diagnosis not present

## 2022-07-12 ENCOUNTER — Telehealth: Payer: Self-pay | Admitting: Pulmonary Disease

## 2022-07-13 NOTE — Telephone Encounter (Signed)
Patient has been scheduled for [facility] same date at 10am, left pt a vm this morning

## 2022-07-16 ENCOUNTER — Other Ambulatory Visit: Payer: Self-pay | Admitting: Family Medicine

## 2022-07-16 DIAGNOSIS — E785 Hyperlipidemia, unspecified: Secondary | ICD-10-CM

## 2022-07-17 ENCOUNTER — Ambulatory Visit: Payer: Medicare Other | Admitting: Family Medicine

## 2022-07-18 ENCOUNTER — Telehealth: Payer: Self-pay | Admitting: Family Medicine

## 2022-07-18 NOTE — Telephone Encounter (Signed)
Pt called stating that she needed a new script for her furosemide sent in. After reviewing Chart, advised pt that we had sent in a 90 day supply on the day of her last appt (12.11.23) with one refill. Pt stated she only received a 30 day supply with no refill and the note stating that she needed an appt still on it. Wondering if pharmacy refilled from old script somehow. Advised pt that a note would be sent back to look into this. Pt acknowledged understanding.

## 2022-07-19 ENCOUNTER — Encounter: Payer: Self-pay | Admitting: Family Medicine

## 2022-07-19 ENCOUNTER — Ambulatory Visit (INDEPENDENT_AMBULATORY_CARE_PROVIDER_SITE_OTHER): Payer: Medicare Other | Admitting: Family Medicine

## 2022-07-19 VITALS — BP 110/70 | HR 74 | Temp 97.5°F | Resp 18 | Ht 60.0 in | Wt 178.6 lb

## 2022-07-19 DIAGNOSIS — I890 Lymphedema, not elsewhere classified: Secondary | ICD-10-CM | POA: Diagnosis not present

## 2022-07-19 DIAGNOSIS — G4700 Insomnia, unspecified: Secondary | ICD-10-CM | POA: Diagnosis not present

## 2022-07-19 DIAGNOSIS — R600 Localized edema: Secondary | ICD-10-CM

## 2022-07-19 MED ORDER — TRAZODONE HCL 50 MG PO TABS: 50.0000 mg | ORAL_TABLET | Freq: Every day | ORAL | 1 refills | Status: DC

## 2022-07-19 MED ORDER — SPIRONOLACTONE 25 MG PO TABS: 25.0000 mg | ORAL_TABLET | Freq: Two times a day (BID) | ORAL | 3 refills | Status: DC

## 2022-07-19 NOTE — Telephone Encounter (Signed)
Pt had appointment today.

## 2022-07-19 NOTE — Progress Notes (Signed)
Subjective:   By signing my name below, I, [name], attest that this documentation has been prepared under the direction and in the presence of [name], DO. 07/19/2022   Patient ID: Joan [name], female    DOB: 1946/01/03, 77 y.o.   MRN: [id number]  Chief Complaint  Patient presents with   Leg Swelling    Pt states swelling has improved since she has been wrapping her legs.    Follow-up    HPI Patient is in today for an office visit.  She continues applying compression wraps to her lower legs. She does not elevate her legs while seated due to her back pain. She keeps her legs elevated while lying down. Her legs occasionally drain. She is taking 25 mg spironolactone and 40 mg lasix daily PO to manage the fluid in her legs. She also takes 80 mew potassium chloride supplements daily. She denies having any shortness of breath. She occasionally uses supplemental oxygen. She has seen a wound center in the past and was told to see a lymphedema clinic. She made an appointment but did not receive a call back from the clinic in [place] for the past 8 months.    Past Medical History:  Diagnosis Date   Arthritis    "shoulders; wrist; probably spine" (06/24/2013)   Blood transfusion without reported diagnosis    Cancer ([place])    skin cancer right leg x 4     Cataract    removed both eyes    Chronic low back pain    followed by Dr Hardin Negus pain mgt   Colon polyp    Constipation    COPD (chronic obstructive pulmonary disease) (HCC)    mild   Esophageal stricture    Finger pain, left    2 fingers on left hand since wrist surgery   GERD (gastroesophageal reflux disease)    Glaucoma    Heart murmur    "slight; not on RX" (06/24/2013)   History of cardiac arrhythmia    cardiologist- Traci Turner   Hx of colonic polyps 08/28/2004   Hyperlipemia    Hypertension    Hypothyroidism    Osteoarthritis of left knee    advanced   PONV (postoperative nausea and vomiting)     Pt reports symptoms are the result of gallbladder and cholecystectomy, not anesthesia   PVC's (premature ventricular contractions)    Sleep apnea 1990's   "tested; tried mask; couldn't stand it; told me as long as I slept on my side I'd be ok" (06/24/2013)   Thyroid nodule    Tobacco abuse     Past Surgical History:  Procedure Laterality Date   ABDOMINAL HYSTERECTOMY  1988   "partial" (06/24/2013)   ABDOMINAL HYSTERECTOMY     partial in [place]   "tendon repair" (06/24/2013)   [place]     "think today was my 8th back OR" (06/24/2013)   CERVICAL SPINE SURGERY  2012   CESAREAN SECTION  1972   CHOLECYSTECTOMY N/A 04/16/2013   Procedure: LAPAROSCOPIC CHOLECYSTECTOMY ;  Surgeon: Imogene Burn. Tsuei, MD;  Location: [facility];  Service: General;  Laterality: N/A;   COLONOSCOPY     ELBOW SURGERY  1990's   FOOT SURGERY Right 2012   SPUR REMOVED   INFUSION PUMP IMPLANTATION  1990's   "implantablet morphine pump; took it out w/in 11 months   KNEE ARTHROSCOPY Left 1991; ~ [facility]  04/16/2013   Procedure: LAPAROSCOPIC LYSIS OF ADHESIONS;  Surgeon: Imogene Burn. Georgette Dover, MD;  Location: [facility];  Service: General;  Laterality: N/A;   LUMBAR FUSION     and rods   LUMBAR LAMINECTOMY/DECOMPRESSION MICRODISCECTOMY N/A 11/28/2012   Procedure: DECOMPRESSIVE LUMBAR LAMINECTOMY LEVEL 1;  Surgeon: Elaina Hoops, MD;  Location: [facility];  Service: Neurosurgery;  Laterality: N/A;  DECOMPRESSIVE LUMBAR LAMINECTOMY LEVEL 1   ORIF DISTAL RADIUS FRACTURE Left 12/30/2013   dr Caralyn Guile   ORIF WRIST FRACTURE Left 12/30/2013   Procedure: OPEN REDUCTION INTERNAL FIXATION (ORIF) LEFT WRIST FRACTURE AND REPAIR AS INDICATED;  Surgeon: Linna Hoff, MD;  Location: [facility];  Service: Orthopedics;  Laterality: Left;   POLYPECTOMY     POSTERIOR FUSION LUMBAR SPINE  06/24/2013   ROBOTIC ASSISTED SALPINGO OOPHERECTOMY Bilateral 08/20/2014   Procedure: ROBOTIC ASSISTED  SALPINGO OOPHORECTOMY;  Surgeon: Daria Pastures, MD;  Location: [facility];  Service: Gynecology;  Laterality: Bilateral;   SHOULDER ARTHROSCOPY Left 09/2011   THYROIDECTOMY  2012   TOTAL HIP ARTHROPLASTY Left 04/27/2019   Procedure: TOTAL HIP ARTHROPLASTY ANTERIOR APPROACH;  Surgeon: Gaynelle Arabian, MD;  Location: [facility];  Service: Orthopedics;  Laterality: Left;  142mn   TOTAL HIP ARTHROPLASTY Right 09/09/2019   Procedure: TOTAL HIP ARTHROPLASTY ANTERIOR APPROACH;  Surgeon: AGaynelle Arabian MD;  Location: [facility];  Service: Orthopedics;  Laterality: Right;  1051m   TOTAL KNEE ARTHROPLASTY Right 09/09/2017   Procedure: RIGHT TOTAL KNEE ARTHROPLASTY;  Surgeon: AlGaynelle ArabianMD;  Location: [facility];  Service: Orthopedics;  Laterality: Right;   UPPER GASTROINTESTINAL ENDOSCOPY     VITRECTOMY Right 10/18/2020    Family History  Problem Relation Age of Onset   Pancreatic cancer Father    Cancer Father    Diabetes type II Mother    Hypertension Mother    Coronary artery disease Mother [id number] Diabetes Mother    Thyroid cancer Mother    Skin cancer Mother    Diabetes Sister    Hypertension Sister    Bone cancer Sister    Breast cancer Sister    Hypertension Brother    Breast cancer Maternal Aunt    Other Neg Hx        No family history of  colon cancer   Colon cancer Neg Hx    Colon polyps Neg Hx    Esophageal cancer Neg Hx    Stomach cancer Neg Hx    Rectal cancer Neg Hx     Social History   Socioeconomic History   Marital status: Divorced    Spouse name: Not on file   Number of children: Not on file   Years of education: Not on file   Highest education level: Not on file  Occupational History   Not on file  Tobacco Use   Smoking status: Every Day    Packs/day: 1.50    Years: 50.00    Total pack years: 75.00    Types: Cigarettes   Smokeless tobacco: Never   Tobacco comments:    10 cigarettes smoked daily ARJ 11/07/21  Vaping Use   Vaping Use: Never used  Substance and  Sexual Activity   Alcohol use: No    Alcohol/week: 0.0 standard drinks of alcohol   Drug use: No   Sexual activity: Not Currently  Other Topics Concern   Not on file  Social History Narrative   Divorced   Current Smoker  1 ppd -  20 yrs  Alcohol use-no       [employer] - laid off       Physician roster:   Dr. Elta Guadeloupe Philips - pain management   Dr. Philis Pique - GYN   Dr. Saintclair Halsted - Neurosurgery   Dr. Ronnald Ramp - dermatology   Dr. Caralyn Guile - orthopedics   Dr. Fransico Him - cardiology   Dr. Miller-ophthalmology   Social Determinants of Health   Financial Resource Strain: Low Risk  (11/16/2021)   Overall Financial Resource Strain (CARDIA)    Difficulty of Paying Living Expenses: Not hard at all  Food Insecurity: No Food Insecurity (11/16/2021)   Hunger Vital Sign    Worried About Running Out of Food in the Last Year: Never true    [place] in the Last Year: Never true  Transportation Needs: No Transportation Needs (11/16/2021)   PRAPARE - Hydrologist (Medical): No    Lack of Transportation (Non-Medical): No  Physical Activity: Inactive (11/16/2021)   Exercise Vital Sign    Days of Exercise per Week: 0 days    Minutes of Exercise per Session: 0 min  Stress: No Stress Concern Present (11/16/2021)   [place]    Feeling of Stress : Not at all  Social Connections: Socially Isolated (11/16/2021)   Social Connection and Isolation Panel [NHANES]    Frequency of Communication with Friends and Family: Twice a week    Frequency of Social Gatherings with Friends and Family: Once a week    Attends Religious Services: Never    Marine scientist or Organizations: No    Attends Archivist Meetings: Never    Marital Status: Divorced  Human resources officer Violence: Not At Risk (11/16/2021)   Humiliation, Afraid, Rape, and Kick questionnaire    Fear of Current or  Ex-Partner: No    Emotionally Abused: No    Physically Abused: No    Sexually Abused: No    Outpatient Medications Prior to Visit  Medication Sig Dispense Refill   Acetaminophen (TYLENOL 8 HOUR ARTHRITIS PAIN PO) Take by mouth.     albuterol (VENTOLIN HFA) 108 (90 Base) MCG/ACT inhaler Inhale 2 puffs into the lungs every 6 (six) hours as needed for wheezing or shortness of breath. 6.7 g 2   bisacodyl (DULCOLAX) 5 MG EC tablet Take 5-10 mg by mouth daily as needed for mild constipation.  30 tablet    carisoprodol (SOMA) 350 MG tablet Take 175 mg by mouth 3 (three) times daily.      diclofenac Sodium (VOLTAREN) 1 % GEL as needed.     dorzolamide-timolol (COSOPT) 22.3-6.8 MG/ML ophthalmic solution Place 1 drop into the left eye 2 (two) times daily.     estradiol (ESTRACE) 0.5 MG tablet Take 0.5 mg by mouth at bedtime.   4   fluocinonide ointment (LIDEX) 4.97 % Apply 1 application topically 2 (two) times daily.     fluticasone (FLONASE) 50 MCG/ACT nasal spray Place 2 sprays into both nostrils daily. 16 g 1   furosemide (LASIX) 40 MG tablet TAKE 2 TABLETS BY MOUTH ONCE DAILY . 180 tablet 1   ipratropium (ATROVENT) 0.06 % nasal spray Place 2 sprays into both nostrils 2 (two) times daily as needed for rhinitis. 15 mL 5   levothyroxine (SYNTHROID) 88 MCG tablet Take 1 tablet (88 mcg total) by mouth daily before breakfast. 90 tablet 3   NONFORMULARY OR COMPOUNDED ITEM Compression socks  20-30 mm/ hg   Dx low ext edema 1 each 1   omeprazole (PRILOSEC) 20 MG capsule Take 2 capsules by mouth once daily 180 capsule 1   oxyCODONE-acetaminophen (PERCOCET) 10-325 MG tablet Take 1 tablet by mouth every 4 (four) hours as needed for pain. 180 tablet 0   potassium chloride SA (KLOR-CON M) 20 MEQ tablet Take 4 tablets (80 mEq total) by mouth daily. 270 tablet 3   simvastatin (ZOCOR) 20 MG tablet TAKE 1 TABLET BY MOUTH AT  BEDTIME 90 tablet 1   TRELEGY ELLIPTA 100-62.5-25 MCG/ACT AEPB Inhale 1 puff into the lungs  daily.     triamcinolone cream (KENALOG) 0.1 % Apply topically.     spironolactone (ALDACTONE) 25 MG tablet Take 1 tablet (25 mg total) by mouth daily. 90 tablet 3   traZODone (DESYREL) 50 MG tablet TAKE 1 TABLET BY MOUTH AT BEDTIME 90 tablet 0   oxyCODONE-acetaminophen (PERCOCET) 10-325 MG tablet Take 1 tablet by mouth every 4 (four) hours as needed for pain (fill 07/21/22) 180 tablet 0   cephALEXin (KEFLEX) 500 MG capsule Take 1 capsule (500 mg total) by mouth 2 (two) times daily. 20 capsule 0   COVID-19 mRNA bivalent vaccine, Moderna, (MODERNA COVID-19 BIVAL BOOSTER) 50 MCG/0.5ML injection Inject into the muscle. (Patient not taking: Reported on 07/19/2022) 0.5 mL 0   oxyCODONE-acetaminophen (PERCOCET) 10-325 MG tablet Take 1 tablet by mouth every 4 (four) hours as needed for pain. 180 tablet 0   No facility-administered medications prior to visit.    Allergies  Allergen Reactions   Morphine And Related Swelling   5-Alpha Reductase Inhibitors    Amitriptyline Swelling    Leg swelling   Augmentin [Amoxicillin-Pot Clavulanate] Nausea And Vomiting   Gabapentin Itching   Triamterene Itching and Rash    Review of Systems  Constitutional:  Negative for fever and malaise/fatigue.  HENT:  Negative for congestion.   Eyes:  Negative for blurred vision.  Respiratory:  Negative for shortness of breath.   Cardiovascular:  Positive for leg swelling (bilateral). Negative for chest pain and palpitations.  Gastrointestinal:  Negative for abdominal pain, blood in stool and nausea.  Genitourinary:  Negative for dysuria and frequency.  Musculoskeletal:  Negative for falls.  Skin:  Negative for rash.  Neurological:  Negative for dizziness, loss of consciousness and headaches.  Endo/Heme/Allergies:  Negative for environmental allergies.  Psychiatric/Behavioral:  Negative for depression. The patient is not nervous/anxious.        Objective:    Physical Exam Vitals and nursing note reviewed.   Constitutional:      Appearance: Normal appearance.  HENT:     Head: Normocephalic and atraumatic.     Right Ear: External ear normal.     Left Ear: External ear normal.  Eyes:     Extraocular Movements: Extraocular movements intact.     Pupils: Pupils are equal, round, and reactive to light.  Cardiovascular:     Rate and Rhythm: Normal rate and regular rhythm.     Heart sounds: Normal heart sounds. No murmur heard.    No gallop.  Musculoskeletal:     Right lower leg: 3+ Pitting Edema present.     Left lower leg: 4+ Pitting Edema present.  Neurological:     Mental Status: She is alert and oriented to person, place, and time.  Psychiatric:        Judgment: Judgment normal.     BP 110/70 (BP Location: Left Arm, Patient Position: Sitting, Cuff Size:  Large)   Pulse 74   Temp (!) 97.5 F (36.4 C) (Oral)   Resp 18   Ht 5' (1.524 m)   Wt 178 lb 9.6 oz (81 kg)   SpO2 97%   BMI 34.88 kg/m  Wt Readings from Last 3 Encounters:  07/19/22 178 lb 9.6 oz (81 kg)  06/18/22 177 lb 12.8 oz (80.6 kg)  05/25/22 162 lb 12.8 oz (73.8 kg)       Assessment & Plan:  Lower extremity edema -     Ambulatory referral to Physical Therapy -     Spironolactone; Take 1 tablet (25 mg total) by mouth 2 (two) times daily.  Dispense: 180 tablet; Refill: 3 -     Comprehensive metabolic panel  Insomnia, unspecified type -     traZODone HCl; Take 1 tablet (50 mg total) by mouth at bedtime.  Dispense: 90 tablet; Refill: 1  Lymphedema -     Ambulatory referral to Physical Therapy    I, [name], DO, personally preformed the services described in this documentation.  All medical record entries made by the scribe were at my direction and in my presence.  I have reviewed the chart and discharge instructions (if applicable) and agree that the record reflects my personal performance and is accurate and complete. 07/19/2022   I,[name],acting as a scribe for [name], DO.,have  documented all relevant documentation on the behalf of [name], DO,as directed by  [name], DO while in the presence of [name], DO.   [name], DO

## 2022-07-20 LAB — COMPREHENSIVE METABOLIC PANEL
ALT: 12 U/L (ref 0–35)
AST: 14 U/L (ref 0–37)
Albumin: 4.1 g/dL (ref 3.5–5.2)
Alkaline Phosphatase: 101 U/L (ref 39–117)
BUN: 14 mg/dL (ref 6–23)
CO2: 30 mEq/L (ref 19–32)
Calcium: 8.3 mg/dL — ABNORMAL LOW (ref 8.4–10.5)
Chloride: 100 mEq/L (ref 96–112)
Creatinine, Ser: 0.81 mg/dL (ref 0.40–1.20)
GFR: 69.89 mL/min (ref >60.00)
Glucose, Bld: 128 mg/dL — ABNORMAL HIGH (ref 70–99)
Potassium: 4.1 mEq/L (ref 3.5–5.1)
Sodium: 138 mEq/L (ref 135–145)
Total Bilirubin: 0.4 mg/dL (ref 0.2–1.2)
Total Protein: 6.3 g/dL (ref 6.0–8.3)

## 2022-07-26 DIAGNOSIS — R0902 Hypoxemia: Secondary | ICD-10-CM | POA: Diagnosis not present

## 2022-07-26 DIAGNOSIS — J449 Chronic obstructive pulmonary disease, unspecified: Secondary | ICD-10-CM | POA: Diagnosis not present

## 2022-08-02 ENCOUNTER — Other Ambulatory Visit (HOSPITAL_COMMUNITY): Payer: Self-pay

## 2022-08-02 ENCOUNTER — Ambulatory Visit (INDEPENDENT_AMBULATORY_CARE_PROVIDER_SITE_OTHER): Payer: Medicare Other | Admitting: Family Medicine

## 2022-08-02 ENCOUNTER — Encounter: Payer: Self-pay | Admitting: Family Medicine

## 2022-08-02 VITALS — BP 118/80 | HR 74 | Temp 98.1°F | Resp 20 | Ht 60.0 in | Wt 175.0 lb

## 2022-08-02 DIAGNOSIS — J439 Emphysema, unspecified: Secondary | ICD-10-CM | POA: Diagnosis not present

## 2022-08-02 DIAGNOSIS — R609 Edema, unspecified: Secondary | ICD-10-CM

## 2022-08-02 MED ORDER — OXYCODONE-ACETAMINOPHEN 10-325 MG PO TABS
1.0000 | ORAL_TABLET | ORAL | 0 refills | Status: DC | PRN
  Filled 2022-08-02: qty 180, 30d supply, fill #0

## 2022-08-02 MED ORDER — TRELEGY ELLIPTA 100-62.5-25 MCG/ACT IN AEPB: 1.0000 | INHALATION_SPRAY | Freq: Every day | RESPIRATORY_TRACT | 3 refills | Status: DC

## 2022-08-02 NOTE — Assessment & Plan Note (Signed)
Con't wraps  Con't lasix daily  Elevate legs Lymph edema clinic pending

## 2022-08-02 NOTE — Progress Notes (Signed)
Subjective:   By signing my name below, I, [name], attest that this documentation has been prepared under the direction and in the presence of [name], DO. 08/02/2022   Patient ID: Joan [name], female    DOB: 1946/04/04, 77 y.o.   MRN: [id number]  Chief Complaint  Patient presents with  . Edema  . Follow-up    HPI Patient is in today for a follow up visit.   Her swelling has improved since last visit. She is applying ointment her dermatologist prescribed regularly. She continues taking 80 mg lasix daily PO and reports urinating more frequently while taking them. She has not been reached out to from the lymphedema clinic.    Past Medical History:  Diagnosis Date  . Arthritis    "shoulders; wrist; probably spine" (06/24/2013)  . Blood transfusion without reported diagnosis   . Cancer ([place])    skin cancer right leg x 4    . Cataract    removed both eyes   . Chronic low back pain    followed by Dr Hardin Negus pain mgt  . Colon polyp   . Constipation   . COPD (chronic obstructive pulmonary disease) (HCC)    mild  . Esophageal stricture   . Finger pain, left    2 fingers on left hand since wrist surgery  . GERD (gastroesophageal reflux disease)   . Glaucoma   . Heart murmur    "slight; not on RX" (06/24/2013)  . History of cardiac arrhythmia    cardiologist- Traci Turner  . Hx of colonic polyps 08/28/2004  . Hyperlipemia   . Hypertension   . Hypothyroidism   . Osteoarthritis of left knee    advanced  . PONV (postoperative nausea and vomiting)    Pt reports symptoms are the result of gallbladder and cholecystectomy, not anesthesia  . PVC's (premature ventricular contractions)   . Sleep apnea 1990's   "tested; tried mask; couldn't stand it; told me as long as I slept on my side I'd be ok" (06/24/2013)  . Thyroid nodule   . Tobacco abuse     Past Surgical History:  Procedure Laterality Date  . ABDOMINAL HYSTERECTOMY  1988   "partial"  (06/24/2013)  . ABDOMINAL HYSTERECTOMY     partial in 1988  . ANKLE SURGERY Left 1995   "tendon repair" (06/24/2013)  . BACK SURGERY     "think today was my 8th back OR" (06/24/2013)  . CERVICAL SPINE SURGERY  2012  . [place]  . CHOLECYSTECTOMY N/A 04/16/2013   Procedure: LAPAROSCOPIC CHOLECYSTECTOMY ;  Surgeon: Imogene Burn. Georgette Dover, MD;  Location: [facility];  Service: General;  Laterality: N/A;  . COLONOSCOPY    . ELBOW SURGERY  1990's  . FOOT SURGERY Right 2012   SPUR REMOVED  . INFUSION PUMP IMPLANTATION  1990's   "implantablet morphine pump; took it out w/in 11 months  . KNEE ARTHROSCOPY Left 1991; ~ 1993  . LAPAROSCOPIC LYSIS OF ADHESIONS N/A 04/16/2013   Procedure: LAPAROSCOPIC LYSIS OF ADHESIONS;  Surgeon: Imogene Burn. Georgette Dover, MD;  Location: [facility];  Service: General;  Laterality: N/A;  . LUMBAR FUSION     and rods  . LUMBAR LAMINECTOMY/DECOMPRESSION MICRODISCECTOMY N/A 11/28/2012   Procedure: DECOMPRESSIVE LUMBAR LAMINECTOMY LEVEL 1;  Surgeon: Elaina Hoops, MD;  Location: [facility];  Service: Neurosurgery;  Laterality: N/A;  DECOMPRESSIVE LUMBAR LAMINECTOMY LEVEL 1  . ORIF DISTAL RADIUS FRACTURE Left 12/30/2013   dr Caralyn Guile  .  ORIF WRIST FRACTURE Left 12/30/2013   Procedure: OPEN REDUCTION INTERNAL FIXATION (ORIF) LEFT WRIST FRACTURE AND REPAIR AS INDICATED;  Surgeon: Linna Hoff, MD;  Location: [facility];  Service: Orthopedics;  Laterality: Left;  . POLYPECTOMY    . POSTERIOR FUSION LUMBAR SPINE  06/24/2013  . ROBOTIC ASSISTED SALPINGO OOPHERECTOMY Bilateral 08/20/2014   Procedure: ROBOTIC ASSISTED SALPINGO OOPHORECTOMY;  Surgeon: Daria Pastures, MD;  Location: [facility];  Service: Gynecology;  Laterality: Bilateral;  . SHOULDER ARTHROSCOPY Left 09/2011  . THYROIDECTOMY  2012  . TOTAL HIP ARTHROPLASTY Left 04/27/2019   Procedure: TOTAL HIP ARTHROPLASTY ANTERIOR APPROACH;  Surgeon: Gaynelle Arabian, MD;  Location: [facility];  Service: Orthopedics;  Laterality: Left;  148mn  .  TOTAL HIP ARTHROPLASTY Right 09/09/2019   Procedure: TOTAL HIP ARTHROPLASTY ANTERIOR APPROACH;  Surgeon: AGaynelle Arabian MD;  Location: [facility];  Service: Orthopedics;  Laterality: Right;  1025m  . TOTAL KNEE ARTHROPLASTY Right 09/09/2017   Procedure: RIGHT TOTAL KNEE ARTHROPLASTY;  Surgeon: AlGaynelle ArabianMD;  Location: [facility];  Service: Orthopedics;  Laterality: Right;  . UPPER GASTROINTESTINAL ENDOSCOPY    . VITRECTOMY Right 10/18/2020    Family History  Problem Relation Age of Onset  . Pancreatic cancer Father   . Cancer Father   . Diabetes type II Mother   . Hypertension Mother   . Coronary artery disease Mother [id number]. Diabetes Mother   . Thyroid cancer Mother   . Skin cancer Mother   . Diabetes Sister   . Hypertension Sister   . Bone cancer Sister   . Breast cancer Sister   . Hypertension Brother   . Breast cancer Maternal Aunt   . Other Neg Hx        No family history of  colon cancer  . Colon cancer Neg Hx   . Colon polyps Neg Hx   . Esophageal cancer Neg Hx   . Stomach cancer Neg Hx   . Rectal cancer Neg Hx     Social History   Socioeconomic History  . Marital status: Divorced    Spouse name: Not on file  . Number of children: Not on file  . Years of education: Not on file  . Highest education level: Not on file  Occupational History  . Not on file  Tobacco Use  . Smoking status: Every Day    Packs/day: 1.50    Years: 50.00    Total pack years: 75.00    Types: Cigarettes  . Smokeless tobacco: Never  . Tobacco comments:    10 cigarettes smoked daily ARJ 11/07/21  Vaping Use  . Vaping Use: Never used  Substance and Sexual Activity  . Alcohol use: No    Alcohol/week: 0.0 standard drinks of alcohol  . Drug use: No  . Sexual activity: Not Currently  Other Topics Concern  . Not on file  Social History Narrative   Divorced   Current Smoker  1 ppd -  20 yrs      Alcohol use-no       [employer] - laid off       Physician roster:   Dr.  MaElta Guadeloupehilips - pain management   Dr. HoPhilis Pique GYN   Dr. CrSaintclair Halsted Neurosurgery   Dr. JoRonnald Ramp dermatology   Dr. OrCaralyn Guile orthopedics   Dr. TrFransico Him cardiology   Dr. Miller-ophthalmology   Social Determinants of Health   Financial Resource Strain: Low Risk  (11/16/2021)  Overall Emergency planning/management officer Strain (CARDIA)   . Difficulty of Paying Living Expenses: Not hard at all  Food Insecurity: No Food Insecurity (11/16/2021)   Hunger Vital Sign   . Worried About Charity fundraiser in the Last Year: Never true   . Ran Out of Food in the Last Year: Never true  Transportation Needs: No Transportation Needs (11/16/2021)   PRAPARE - Transportation   . Lack of Transportation (Medical): No   . Lack of Transportation (Non-Medical): No  Physical Activity: Inactive (11/16/2021)   Exercise Vital Sign   . Days of Exercise per Week: 0 days   . Minutes of Exercise per Session: 0 min  Stress: No Stress Concern Present (11/16/2021)   [place]   . Feeling of Stress : Not at all  Social Connections: Socially Isolated (11/16/2021)   Social Connection and Isolation Panel [NHANES]   . Frequency of Communication with Friends and Family: Twice a week   . Frequency of Social Gatherings with Friends and Family: Once a week   . Attends Religious Services: Never   . Active Member of Clubs or Organizations: No   . Attends Archivist Meetings: Never   . Marital Status: Divorced  Human resources officer Violence: Not At Risk (11/16/2021)   Humiliation, Afraid, Rape, and Kick questionnaire   . Fear of Current or Ex-Partner: No   . Emotionally Abused: No   . Physically Abused: No   . Sexually Abused: No    Outpatient Medications Prior to Visit  Medication Sig Dispense Refill  . Acetaminophen (TYLENOL 8 HOUR ARTHRITIS PAIN PO) Take by mouth.    Marland Kitchen albuterol (VENTOLIN HFA) 108 (90 Base) MCG/ACT inhaler Inhale 2 puffs into the lungs every 6  (six) hours as needed for wheezing or shortness of breath. 6.7 g 2  . bisacodyl (DULCOLAX) 5 MG EC tablet Take 5-10 mg by mouth daily as needed for mild constipation.  30 tablet   . carisoprodol (SOMA) 350 MG tablet Take 175 mg by mouth 3 (three) times daily.     . diclofenac Sodium (VOLTAREN) 1 % GEL as needed.    . dorzolamide-timolol (COSOPT) 22.3-6.8 MG/ML ophthalmic solution Place 1 drop into the left eye 2 (two) times daily.    Marland Kitchen estradiol (ESTRACE) 0.5 MG tablet Take 0.5 mg by mouth at bedtime.   4  . fluocinonide ointment (LIDEX) 8.65 % Apply 1 application topically 2 (two) times daily.    . fluticasone (FLONASE) 50 MCG/ACT nasal spray Place 2 sprays into both nostrils daily. 16 g 1  . furosemide (LASIX) 40 MG tablet TAKE 2 TABLETS BY MOUTH ONCE DAILY . 180 tablet 1  . ipratropium (ATROVENT) 0.06 % nasal spray Place 2 sprays into both nostrils 2 (two) times daily as needed for rhinitis. 15 mL 5  . levothyroxine (SYNTHROID) 88 MCG tablet Take 1 tablet (88 mcg total) by mouth daily before breakfast. 90 tablet 3  . NONFORMULARY OR COMPOUNDED ITEM Compression socks  20-30 mm/ hg   Dx low ext edema 1 each 1  . omeprazole (PRILOSEC) 20 MG capsule Take 2 capsules by mouth once daily 180 capsule 1  . oxyCODONE-acetaminophen (PERCOCET) 10-325 MG tablet Take 1 tablet by mouth every 4 (four) hours as needed for pain. 180 tablet 0  . potassium chloride SA (KLOR-CON M) 20 MEQ tablet Take 4 tablets (80 mEq total) by mouth daily. 270 tablet 3  . simvastatin (ZOCOR) 20 MG tablet TAKE  1 TABLET BY MOUTH AT  BEDTIME 90 tablet 1  . spironolactone (ALDACTONE) 25 MG tablet Take 1 tablet (25 mg total) by mouth 2 (two) times daily. 180 tablet 3  . traZODone (DESYREL) 50 MG tablet Take 1 tablet (50 mg total) by mouth at bedtime. 90 tablet 1  . triamcinolone cream (KENALOG) 0.1 % Apply topically.    . TRELEGY ELLIPTA 100-62.5-25 MCG/ACT AEPB Inhale 1 puff into the lungs daily.     No facility-administered  medications prior to visit.    Allergies  Allergen Reactions  . Morphine And Related Swelling  . 5-Alpha Reductase Inhibitors   . Amitriptyline Swelling    Leg swelling  . Augmentin [Amoxicillin-Pot Clavulanate] Nausea And Vomiting  . Gabapentin Itching  . Triamterene Itching and Rash    Review of Systems  Constitutional:  Negative for fever and malaise/fatigue.  HENT:  Negative for congestion.   Eyes:  Negative for blurred vision.  Respiratory:  Negative for cough and shortness of breath.   Cardiovascular:  Positive for leg swelling. Negative for chest pain and palpitations.  Gastrointestinal:  Negative for vomiting.  Musculoskeletal:  Negative for back pain.  Skin:  Negative for rash.  Neurological:  Negative for loss of consciousness and headaches.      Objective:    Physical Exam Vitals and nursing note reviewed.  Constitutional:      General: She is not in acute distress.    Appearance: Normal appearance. She is not ill-appearing.  HENT:     Head: Normocephalic and atraumatic.     Right Ear: External ear normal.     Left Ear: External ear normal.  Eyes:     Extraocular Movements: Extraocular movements intact.     Pupils: Pupils are equal, round, and reactive to light.  Cardiovascular:     Rate and Rhythm: Normal rate and regular rhythm.     Heart sounds: Normal heart sounds. No murmur heard.    No gallop.  Pulmonary:     Effort: Pulmonary effort is normal. No respiratory distress.     Breath sounds: Normal breath sounds. No wheezing or rales.  Musculoskeletal:     Comments: Still some low ext edema but much improved    Skin:    General: Skin is warm and dry.  Neurological:     Mental Status: She is alert and oriented to person, place, and time.  Psychiatric:        Judgment: Judgment normal.    BP 118/80 (BP Location: Left Arm, Patient Position: Sitting, Cuff Size: Large)   Pulse 74   Temp 98.1 F (36.7 C) (Oral)   Resp 20   Ht 5' (1.524 m)   Wt  175 lb (79.4 kg)   SpO2 96%   BMI 34.18 kg/m  Wt Readings from Last 3 Encounters:  08/02/22 175 lb (79.4 kg)  07/19/22 178 lb 9.6 oz (81 kg)  06/18/22 177 lb 12.8 oz (80.6 kg)       Assessment & Plan:  Pulmonary emphysema, unspecified emphysema type ([place]) -     Trelegy Ellipta; Inhale 1 puff into the lungs daily.  Dispense: 3 each; Refill: 3    I, [name], DO, personally preformed the services described in this documentation.  All medical record entries made by the scribe were at my direction and in my presence.  I have reviewed the chart and discharge instructions (if applicable) and agree that the record reflects my personal performance and is accurate and  complete. 08/02/2022   I,[name],acting as a scribe for [name], DO.,have documented all relevant documentation on the behalf of [name], DO,as directed by  [name], DO while in the presence of [name], DO.   [name], DO

## 2022-08-21 ENCOUNTER — Other Ambulatory Visit (HOSPITAL_COMMUNITY): Payer: Self-pay

## 2022-08-21 ENCOUNTER — Telehealth: Payer: Self-pay | Admitting: Pulmonary Disease

## 2022-08-21 DIAGNOSIS — G894 Chronic pain syndrome: Secondary | ICD-10-CM | POA: Diagnosis not present

## 2022-08-21 DIAGNOSIS — Z79891 Long term (current) use of opiate analgesic: Secondary | ICD-10-CM | POA: Diagnosis not present

## 2022-08-21 DIAGNOSIS — M961 Postlaminectomy syndrome, not elsewhere classified: Secondary | ICD-10-CM | POA: Diagnosis not present

## 2022-08-21 DIAGNOSIS — M62830 Muscle spasm of back: Secondary | ICD-10-CM | POA: Diagnosis not present

## 2022-08-21 DIAGNOSIS — J449 Chronic obstructive pulmonary disease, unspecified: Secondary | ICD-10-CM

## 2022-08-21 MED ORDER — OXYCODONE-ACETAMINOPHEN 10-325 MG PO TABS
1.0000 | ORAL_TABLET | ORAL | 0 refills | Status: DC | PRN
  Filled 2022-09-10: qty 160, 27d supply, fill #0

## 2022-08-21 MED ORDER — OXYCODONE-ACETAMINOPHEN 10-325 MG PO TABS: 1.0000 | ORAL_TABLET | ORAL | 0 refills | Status: DC | PRN

## 2022-08-23 ENCOUNTER — Other Ambulatory Visit (HOSPITAL_COMMUNITY): Payer: Self-pay

## 2022-08-23 NOTE — Telephone Encounter (Signed)
OK to discontinue oxygen based on 05/2022 note with me.  Please also make sure to schedule patient for follow-up with me. Current recall for March 2024

## 2022-08-23 NOTE — Telephone Encounter (Signed)
Please advise 

## 2022-08-26 DIAGNOSIS — J449 Chronic obstructive pulmonary disease, unspecified: Secondary | ICD-10-CM | POA: Diagnosis not present

## 2022-08-26 DIAGNOSIS — R0902 Hypoxemia: Secondary | ICD-10-CM | POA: Diagnosis not present

## 2022-08-26 NOTE — Progress Notes (Unsigned)
Cardiology Office Note:   Date:  08/28/2022  NAME:  Joan [name]    MRN: [id number] DOB:  12-08-1945   PCP:  [name], DO  Cardiologist:  Evalina Field, MD  Electrophysiologist:  None   Referring MD: Carollee Herter, Alferd Apa, *   Chief Complaint  Patient presents with   Follow-up         History of Present Illness:   Joan [name] is a 77 y.o. female with a hx of COPD, HTN, HLD who presents for follow-up.  She reports she is doing well.  She is actually wrapping her legs with dressings.  They are actually helping the lymphedema.  It is much improved.  She denies any chest pain or trouble breathing.  She has not had active.  She is working with pulmonary.  No signs of congestive heart failure.  No significant symptoms in office today.  EKG is unchanged.     Problem List 1. HTN 2. Tobacco abuse -50 pack year history  3. Venous insufficiency/venous dermatitis  -negative DVT study 03/2019 -BNP 13 -EF 55-60% no WMA on my review  4.  Hyperlipidemia -Total cholesterol 197, HDL 64, LDL 93, triglycerides 197 5. COPD -moderate 6. DM -A1c 6.5  Past Medical History: Past Medical History:  Diagnosis Date   Arthritis    "shoulders; wrist; probably spine" (06/24/2013)   Blood transfusion without reported diagnosis    Cancer ([place])    skin cancer right leg x 4     Cataract    removed both eyes    Chronic low back pain    followed by Dr Hardin Negus pain mgt   Colon polyp    Constipation    COPD (chronic obstructive pulmonary disease) (HCC)    mild   Esophageal stricture    Finger pain, left    2 fingers on left hand since wrist surgery   GERD (gastroesophageal reflux disease)    Glaucoma    Heart murmur    "slight; not on RX" (06/24/2013)   History of cardiac arrhythmia    cardiologist- Traci Turner   Hx of colonic polyps 08/28/2004   Hyperlipemia    Hypertension    Hypothyroidism    Osteoarthritis of left knee    advanced   PONV (postoperative nausea  and vomiting)    Pt reports symptoms are the result of gallbladder and cholecystectomy, not anesthesia   PVC's (premature ventricular contractions)    Sleep apnea 1990's   "tested; tried mask; couldn't stand it; told me as long as I slept on my side I'd be ok" (06/24/2013)   Thyroid nodule    Tobacco abuse     Past Surgical History: Past Surgical History:  Procedure Laterality Date   ABDOMINAL HYSTERECTOMY  1988   "partial" (06/24/2013)   ABDOMINAL HYSTERECTOMY     partial in [place]   "tendon repair" (06/24/2013)   [place]     "think today was my 8th back OR" (06/24/2013)   CERVICAL SPINE SURGERY  2012   CESAREAN SECTION  1972   CHOLECYSTECTOMY N/A 04/16/2013   Procedure: LAPAROSCOPIC CHOLECYSTECTOMY ;  Surgeon: Imogene Burn. Tsuei, MD;  Location: [facility];  Service: General;  Laterality: N/A;   COLONOSCOPY     ELBOW SURGERY  1990's   FOOT SURGERY Right 2012   SPUR REMOVED   INFUSION PUMP IMPLANTATION  1990's   "implantablet morphine pump; took it out w/in 11 months  KNEE ARTHROSCOPY Left 1991; ~ [place] N/A 04/16/2013   Procedure: LAPAROSCOPIC LYSIS OF ADHESIONS;  Surgeon: Imogene Burn. Georgette Dover, MD;  Location: [facility];  Service: General;  Laterality: N/A;   LUMBAR FUSION     and rods   LUMBAR LAMINECTOMY/DECOMPRESSION MICRODISCECTOMY N/A 11/28/2012   Procedure: DECOMPRESSIVE LUMBAR LAMINECTOMY LEVEL 1;  Surgeon: Elaina Hoops, MD;  Location: [facility];  Service: Neurosurgery;  Laterality: N/A;  DECOMPRESSIVE LUMBAR LAMINECTOMY LEVEL 1   ORIF DISTAL RADIUS FRACTURE Left 12/30/2013   dr Caralyn Guile   ORIF WRIST FRACTURE Left 12/30/2013   Procedure: OPEN REDUCTION INTERNAL FIXATION (ORIF) LEFT WRIST FRACTURE AND REPAIR AS INDICATED;  Surgeon: Linna Hoff, MD;  Location: [facility];  Service: Orthopedics;  Laterality: Left;   POLYPECTOMY     POSTERIOR FUSION LUMBAR SPINE  06/24/2013   ROBOTIC ASSISTED SALPINGO OOPHERECTOMY Bilateral  08/20/2014   Procedure: ROBOTIC ASSISTED SALPINGO OOPHORECTOMY;  Surgeon: Daria Pastures, MD;  Location: [facility];  Service: Gynecology;  Laterality: Bilateral;   SHOULDER ARTHROSCOPY Left 09/2011   THYROIDECTOMY  2012   TOTAL HIP ARTHROPLASTY Left 04/27/2019   Procedure: TOTAL HIP ARTHROPLASTY ANTERIOR APPROACH;  Surgeon: Gaynelle Arabian, MD;  Location: [facility];  Service: Orthopedics;  Laterality: Left;  118mn   TOTAL HIP ARTHROPLASTY Right 09/09/2019   Procedure: TOTAL HIP ARTHROPLASTY ANTERIOR APPROACH;  Surgeon: AGaynelle Arabian MD;  [facility];  Service: Orthopedics;  Laterality: Right;  1028m   TOTAL KNEE ARTHROPLASTY Right 09/09/2017   Procedure: RIGHT TOTAL KNEE ARTHROPLASTY;  Surgeon: AlGaynelle ArabianMD;  [facility];  Service: Orthopedics;  Laterality: Right;   UPPER GASTROINTESTINAL ENDOSCOPY     VITRECTOMY Right 10/18/2020    Current Medications: Current Meds  Medication Sig   Acetaminophen (TYLENOL 8 HOUR ARTHRITIS PAIN PO) Take by mouth.   albuterol (VENTOLIN HFA) 108 (90 Base) MCG/ACT inhaler Inhale 2 puffs into the lungs every 6 (six) hours as needed for wheezing or shortness of breath.   bisacodyl (DULCOLAX) 5 MG EC tablet Take 5-10 mg by mouth daily as needed for mild constipation.    carisoprodol (SOMA) 350 MG tablet Take 175 mg by mouth 3 (three) times daily.    diclofenac Sodium (VOLTAREN) 1 % GEL as needed.   dorzolamide-timolol (COSOPT) 22.3-6.8 MG/ML ophthalmic solution Place 1 drop into the left eye 2 (two) times daily.   estradiol (ESTRACE) 0.5 MG tablet Take 0.5 mg by mouth at bedtime.    fluocinonide ointment (LIDEX) [id number].[id number] Apply 1 application topically 2 (two) times daily.   fluticasone (FLONASE) 50 MCG/ACT nasal spray Place 2 sprays into both nostrils daily.   furosemide (LASIX) 40 MG tablet TAKE 2 TABLETS BY MOUTH ONCE DAILY .   ipratropium (ATROVENT) 0.06 % nasal spray Place 2 sprays into both nostrils 2 (two) times daily as needed for rhinitis.    levothyroxine (SYNTHROID) 88 MCG tablet Take 1 tablet (88 mcg total) by mouth daily before breakfast.   NONFORMULARY OR COMPOUNDED ITEM Compression socks  20-30 mm/ hg   Dx low ext edema   omeprazole (PRILOSEC) 20 MG capsule Take 2 capsules by mouth once daily   [START ON 08/31/2022] oxyCODONE-acetaminophen (PERCOCET) 10-325 MG tablet Take 1 tablet by mouth every 4 (four) hours as needed for pain.   potassium chloride SA (KLOR-CON M) 20 MEQ tablet Take 4 tablets (80 mEq total) by mouth daily.   simvastatin (ZOCOR) 20 MG tablet TAKE 1 TABLET BY MOUTH AT  BEDTIME   spironolactone (ALDACTONE) 25 MG tablet Take 1 tablet (25 mg total) by mouth 2 (two) times daily.   traZODone (DESYREL) 50 MG tablet Take 1 tablet (50 mg total) by mouth at bedtime.   TRELEGY ELLIPTA 100-62.5-25 MCG/ACT AEPB Inhale 1 puff into the lungs daily.   triamcinolone cream (KENALOG) 0.1 % Apply topically.     Allergies:    Morphine and related, 5-alpha reductase inhibitors, Amitriptyline, Augmentin [amoxicillin-pot clavulanate], Gabapentin, and Triamterene   Social History: Social History   Socioeconomic History   Marital status: Divorced    Spouse name: Not on file   Number of children: Not on file   Years of education: Not on file   Highest education level: Not on file  Occupational History   Not on file  Tobacco Use   Smoking status: Every Day    Packs/day: 1.50    Years: 50.00    Total pack years: 75.00    Types: Cigarettes   Smokeless tobacco: Never   Tobacco comments:    10 cigarettes smoked daily ARJ 11/07/21  Vaping Use   Vaping Use: Never used  Substance and Sexual Activity   Alcohol use: No    Alcohol/week: 0.0 standard drinks of alcohol   Drug use: No   Sexual activity: Not Currently  Other Topics Concern   Not on file  Social History Narrative   Divorced   Current Smoker  1 ppd -  20 yrs      Alcohol use-no       [employer] - laid off       Physician roster:   Dr. Elta Guadeloupe  [place] - pain management   Dr. Philis Pique - GYN   Dr. Saintclair Halsted - Neurosurgery   Dr. Ronnald Ramp - dermatology   Dr. Caralyn Guile - orthopedics   Dr. Fransico Him - cardiology   Dr. Miller-ophthalmology   Social Determinants of Health   Financial Resource Strain: Low Risk  (11/16/2021)   Overall Financial Resource Strain (CARDIA)    Difficulty of Paying Living Expenses: Not hard at all  Food Insecurity: No Food Insecurity (11/16/2021)   Hunger Vital Sign    Worried About Running Out of Food in the Last Year: Never true    [place] in the Last Year: Never true  Transportation Needs: No Transportation Needs (11/16/2021)   PRAPARE - Hydrologist (Medical): No    Lack of Transportation (Non-Medical): No  Physical Activity: Inactive (11/16/2021)   Exercise Vital Sign    Days of Exercise per Week: 0 days    Minutes of Exercise per Session: 0 min  Stress: No Stress Concern Present (11/16/2021)   [place]    Feeling of Stress : Not at all  Social Connections: Socially Isolated (11/16/2021)   Social Connection and Isolation Panel [NHANES]    Frequency of Communication with Friends and Family: Twice a week    Frequency of Social Gatherings with Friends and Family: Once a week    Attends Religious Services: Never    Marine scientist or Organizations: No    Attends Music therapist: Never    Marital Status: Divorced     Family History: The patient's family history includes Bone cancer in her sister; Breast cancer in her maternal aunt and sister; Cancer in her father; Coronary artery disease (age of onset: 98) in her mother; Diabetes in her mother and sister; Diabetes  type II in her mother; Hypertension in her brother, mother, and sister; Pancreatic cancer in her father; Skin cancer in her mother; Thyroid cancer in her mother. There is no history of Other, Colon cancer, Colon polyps, Esophageal  cancer, Stomach cancer, or Rectal cancer.  ROS:   All other ROS reviewed and negative. Pertinent positives noted in the HPI.     EKGs/Labs/Other Studies Reviewed:   The following studies were personally reviewed by me today:  EKG:  EKG is ordered today.  The ekg ordered today demonstrates normal sinus rhythm heart 74, low voltage, and was personally reviewed by me.   Recent Labs: 10/19/2021: Pro B Natriuretic peptide (BNP) 23.0 06/18/2022: Hemoglobin 13.6; Platelets 230.0; TSH 1.38 07/19/2022: ALT 12; BUN 14; Creatinine, Ser 0.81; Potassium 4.1; Sodium 138   Recent Lipid Panel    Component Value Date/Time   CHOL 197 06/18/2022 1518   TRIG 197.0 (H) 06/18/2022 1518   HDL 64.50 06/18/2022 1518   CHOLHDL 3 06/18/2022 1518   VLDL 39.4 06/18/2022 1518   LDLCALC 93 06/18/2022 1518   LDLCALC 89 03/17/2020 1416   LDLDIRECT 98.0 10/17/2021 1552    Physical Exam:   VS:  BP 126/64 (BP Location: Left Arm, Patient Position: Sitting, Cuff Size: Normal)   Pulse 74   Ht 5' (1.524 m)   Wt 174 lb 9.6 oz (79.2 kg)   SpO2 96%   BMI 34.10 kg/m    Wt Readings from Last 3 Encounters:  08/28/22 174 lb 9.6 oz (79.2 kg)  08/02/22 175 lb (79.4 kg)  07/19/22 178 lb 9.6 oz (81 kg)    General: Well nourished, well developed, in no acute distress Head: Atraumatic, normal size  Eyes: PEERLA, EOMI  Neck: Supple, no JVD Endocrine: No thryomegaly Cardiac: Normal S1, S2; RRR; no murmurs, rubs, or gallops Lungs: Clear to auscultation bilaterally, no wheezing, rhonchi or rales  Abd: Soft, nontender, no hepatomegaly  Ext: No edema, pulses 2+ Musculoskeletal: No deformities, BUE and BLE strength normal and equal Skin: Warm and dry, no rashes   Neuro: Alert and oriented to person, place, time, and situation, CNII-XII grossly intact, no focal deficits  Psych: Normal mood and affect   ASSESSMENT:   Joan [name] is a 77 y.o. female who presents for the following: 1. Venous insufficiency   2. Leg  swelling   3. Shortness of breath   4. Essential hypertension     PLAN:   1. Venous insufficiency 2. Leg swelling -Chronic lymphedema.  Much improved with wrapping her legs.  Continue this and Lasix.  3. Shortness of breath -no signs of heart failure.  BNP normal.  Echo normal my review.  Symptoms are likely COPD related.  4. Essential hypertension -No change to current medications.  Disposition: Return in about 1 year (around 08/29/2023).  Medication Adjustments/Labs and Tests Ordered: Current medicines are reviewed at length with the patient today.  Concerns regarding medicines are outlined above.  Orders Placed This Encounter  Procedures   EKG 12-Lead   No orders of the defined types were placed in this encounter.   Patient Instructions  Medication Instructions:  The current medical regimen is effective;  continue present plan and medications.  *If you need a refill on your cardiac medications before your next appointment, please call your pharmacy*   Follow-Up: At [facility], you and your health needs are our priority.  As part of our continuing mission to provide you with exceptional heart care, we have created designated  Provider Care Teams.  These Care Teams include your primary Cardiologist (physician) and Advanced Practice Providers (APPs -  Physician Assistants and Nurse Practitioners) who all work together to provide you with the care you need, when you need it.  We recommend signing up for the patient portal called "MyChart".  Sign up information is provided on this After Visit Summary.  MyChart is used to connect with patients for Virtual Visits (Telemedicine).  Patients are able to view lab/test results, encounter notes, upcoming appointments, etc.  Non-urgent messages can be sent to your provider as well.   To learn more about what you can do with MyChart, go to [email or web address].    Your next appointment:   12 month(s)  Provider:   Evalina Field, MD        Time Spent with Patient: I have spent a total of 25 minutes with patient reviewing hospital notes, telemetry, EKGs, labs and examining the patient as well as establishing an assessment and plan that was discussed with the patient.  > 50% of time was spent in direct patient care.  Signed, Addison Naegeli. Audie Box, MD, [place]  [address], [place], [place] [address] [phone]  08/28/2022 4:45 PM

## 2022-08-28 ENCOUNTER — Encounter: Payer: Self-pay | Admitting: Cardiovascular Disease

## 2022-08-28 ENCOUNTER — Ambulatory Visit: Payer: Medicare Other | Attending: Cardiovascular Disease | Admitting: Cardiovascular Disease

## 2022-08-28 VITALS — BP 126/64 | HR 74 | Ht 60.0 in | Wt 174.6 lb

## 2022-08-28 DIAGNOSIS — I872 Venous insufficiency (chronic) (peripheral): Secondary | ICD-10-CM | POA: Diagnosis not present

## 2022-08-28 DIAGNOSIS — M7989 Other specified soft tissue disorders: Secondary | ICD-10-CM

## 2022-08-28 DIAGNOSIS — R0602 Shortness of breath: Secondary | ICD-10-CM

## 2022-08-28 DIAGNOSIS — I10 Essential (primary) hypertension: Secondary | ICD-10-CM

## 2022-08-28 NOTE — Patient Instructions (Signed)
Medication Instructions:  The current medical regimen is effective;  continue present plan and medications.  *If you need a refill on your cardiac medications before your next appointment, please call your pharmacy*   Follow-Up: At [facility], you and your health needs are our priority.  As part of our continuing mission to provide you with exceptional heart care, we have created designated Provider Care Teams.  These Care Teams include your primary Cardiologist (physician) and Advanced Practice Providers (APPs -  Physician Assistants and Nurse Practitioners) who all work together to provide you with the care you need, when you need it.  We recommend signing up for the patient portal called "MyChart".  Sign up information is provided on this After Visit Summary.  MyChart is used to connect with patients for Virtual Visits (Telemedicine).  Patients are able to view lab/test results, encounter notes, upcoming appointments, etc.  Non-urgent messages can be sent to your provider as well.   To learn more about what you can do with MyChart, go to [email or web address].    Your next appointment:   12 month(s)  Provider:   Evalina Field, MD

## 2022-09-10 ENCOUNTER — Other Ambulatory Visit (HOSPITAL_COMMUNITY): Payer: Self-pay

## 2022-09-10 ENCOUNTER — Telehealth (HOSPITAL_BASED_OUTPATIENT_CLINIC_OR_DEPARTMENT_OTHER): Payer: Self-pay | Admitting: Pulmonary Disease

## 2022-09-10 NOTE — Telephone Encounter (Signed)
Talked to patient let her know we have sent several messages to [employer] about them getting her oxygen tanks. States she will call them

## 2022-09-10 NOTE — Telephone Encounter (Signed)
Pt states it has been since 2/13 when calling about discharge forms being sent in to [place] (P:[phone]) and hasn't heard anything. Pt frustrated that she is still having to pay for O2. Please advise and call pt with an update.

## 2022-09-12 ENCOUNTER — Other Ambulatory Visit (HOSPITAL_COMMUNITY): Payer: Self-pay

## 2022-09-17 ENCOUNTER — Ambulatory Visit (HOSPITAL_COMMUNITY): Payer: Medicare Other

## 2022-09-18 ENCOUNTER — Other Ambulatory Visit (HOSPITAL_COMMUNITY): Payer: Medicare Other

## 2022-09-26 ENCOUNTER — Other Ambulatory Visit: Payer: Self-pay | Admitting: Family Medicine

## 2022-09-26 DIAGNOSIS — J069 Acute upper respiratory infection, unspecified: Secondary | ICD-10-CM

## 2022-10-09 ENCOUNTER — Ambulatory Visit: Payer: Medicare Other | Admitting: Internal Medicine

## 2022-10-12 ENCOUNTER — Other Ambulatory Visit (HOSPITAL_COMMUNITY): Payer: Self-pay

## 2022-10-12 MED ORDER — OXYCODONE-ACETAMINOPHEN 10-325 MG PO TABS
ORAL_TABLET | ORAL | 0 refills | Status: DC
  Filled 2022-10-12: qty 160, 27d supply, fill #0

## 2022-10-14 ENCOUNTER — Other Ambulatory Visit: Payer: Self-pay | Admitting: Family Medicine

## 2022-10-14 DIAGNOSIS — E876 Hypokalemia: Secondary | ICD-10-CM

## 2022-10-16 ENCOUNTER — Other Ambulatory Visit (HOSPITAL_COMMUNITY): Payer: Self-pay

## 2022-10-16 DIAGNOSIS — M62830 Muscle spasm of back: Secondary | ICD-10-CM | POA: Diagnosis not present

## 2022-10-16 DIAGNOSIS — M961 Postlaminectomy syndrome, not elsewhere classified: Secondary | ICD-10-CM | POA: Diagnosis not present

## 2022-10-16 DIAGNOSIS — G894 Chronic pain syndrome: Secondary | ICD-10-CM | POA: Diagnosis not present

## 2022-10-16 DIAGNOSIS — Z79891 Long term (current) use of opiate analgesic: Secondary | ICD-10-CM | POA: Diagnosis not present

## 2022-10-16 MED ORDER — OXYCODONE-ACETAMINOPHEN 10-325 MG PO TABS: 1.0000 | ORAL_TABLET | ORAL | 0 refills | Status: DC

## 2022-10-20 ENCOUNTER — Other Ambulatory Visit: Payer: Self-pay | Admitting: Family Medicine

## 2022-10-20 DIAGNOSIS — E785 Hyperlipidemia, unspecified: Secondary | ICD-10-CM

## 2022-10-29 ENCOUNTER — Other Ambulatory Visit (HOSPITAL_COMMUNITY): Payer: Self-pay

## 2022-10-30 DIAGNOSIS — B078 Other viral warts: Secondary | ICD-10-CM | POA: Diagnosis not present

## 2022-10-30 DIAGNOSIS — I872 Venous insufficiency (chronic) (peripheral): Secondary | ICD-10-CM | POA: Diagnosis not present

## 2022-10-30 DIAGNOSIS — L308 Other specified dermatitis: Secondary | ICD-10-CM | POA: Diagnosis not present

## 2022-10-30 DIAGNOSIS — D485 Neoplasm of uncertain behavior of skin: Secondary | ICD-10-CM | POA: Diagnosis not present

## 2022-10-30 DIAGNOSIS — B079 Viral wart, unspecified: Secondary | ICD-10-CM | POA: Diagnosis not present

## 2022-10-31 ENCOUNTER — Telehealth: Payer: Self-pay | Admitting: Family Medicine

## 2022-10-31 NOTE — Telephone Encounter (Signed)
Contacted Joan [name] to schedule their annual wellness visit. Appointment made for 11/19/2022.  Joan [name]; Care Guide Ambulatory Clinical Support [place] [facility] Direct Dial: [phone]

## 2022-11-01 ENCOUNTER — Ambulatory Visit: Payer: Medicare Other | Admitting: Family Medicine

## 2022-11-13 ENCOUNTER — Encounter: Payer: Self-pay | Admitting: Family Medicine

## 2022-11-13 ENCOUNTER — Ambulatory Visit (INDEPENDENT_AMBULATORY_CARE_PROVIDER_SITE_OTHER): Payer: Medicare Other | Admitting: Family Medicine

## 2022-11-13 VITALS — BP 114/78 | HR 60 | Temp 98.0°F | Resp 18 | Ht 60.0 in | Wt 176.8 lb

## 2022-11-13 DIAGNOSIS — K219 Gastro-esophageal reflux disease without esophagitis: Secondary | ICD-10-CM | POA: Diagnosis not present

## 2022-11-13 DIAGNOSIS — R739 Hyperglycemia, unspecified: Secondary | ICD-10-CM

## 2022-11-13 DIAGNOSIS — I10 Essential (primary) hypertension: Secondary | ICD-10-CM

## 2022-11-13 DIAGNOSIS — T7840XA Allergy, unspecified, initial encounter: Secondary | ICD-10-CM

## 2022-11-13 DIAGNOSIS — E039 Hypothyroidism, unspecified: Secondary | ICD-10-CM | POA: Diagnosis not present

## 2022-11-13 DIAGNOSIS — E785 Hyperlipidemia, unspecified: Secondary | ICD-10-CM

## 2022-11-13 DIAGNOSIS — J029 Acute pharyngitis, unspecified: Secondary | ICD-10-CM

## 2022-11-13 DIAGNOSIS — R600 Localized edema: Secondary | ICD-10-CM

## 2022-11-13 DIAGNOSIS — R0981 Nasal congestion: Secondary | ICD-10-CM | POA: Diagnosis not present

## 2022-11-13 DIAGNOSIS — R609 Edema, unspecified: Secondary | ICD-10-CM

## 2022-11-13 MED ORDER — OMEPRAZOLE 20 MG PO CPDR: DELAYED_RELEASE_CAPSULE | ORAL | 1 refills | Status: DC

## 2022-11-13 MED ORDER — LORATADINE 10 MG PO TABS: 10.0000 mg | ORAL_TABLET | Freq: Every day | ORAL | 11 refills | Status: DC

## 2022-11-13 NOTE — Assessment & Plan Note (Signed)
Con't with leg wraps and diuretics  Elevate legs +start antibiotics

## 2022-11-13 NOTE — Progress Notes (Signed)
Subjective:   By signing my name below, I, [name], attest that this documentation has been prepared under the direction and in the presence of Donato Schultz, DO. 11/13/2022   Patient ID: Joan [name], female    DOB: Mar 06, 1946, 77 y.o.   MRN: [id number]  Chief Complaint  Patient presents with   Edema   Follow-up    HPI Patient is in today for a follow up visit.   She complains of congestion, rhinorrhea, sore throat and sneezing for the past week. She her highest temperature was 99 degree F a couple days ago. She continues taking tylenol arthritis daily. She is using Flonase and Atrovent nasal spray to manage her symptoms. She did not test for Covid-19.  She reports both ankles are swollen.  She continues taking 40 mg Lasix 2x daily PO and 25 mg spironolactone 2x daily PO. She continues keeping her legs wrapped every night while sleeping.  She complains of redness on both ankles. She continues applying 0.1% kenalog cream to her lower ankles and applies bandage over them. She is requesting a refill for omeprazole.    Past Medical History:  Diagnosis Date   Arthritis    "shoulders; wrist; probably spine" (06/24/2013)   Blood transfusion without reported diagnosis    Cancer (HCC)    skin cancer right leg x 4     Cataract    removed both eyes    Chronic low back pain    followed by Dr Vear Clock pain mgt   Colon polyp    Constipation    COPD (chronic obstructive pulmonary disease) (HCC)    mild   Esophageal stricture    Finger pain, left    2 fingers on left hand since wrist surgery   GERD (gastroesophageal reflux disease)    Glaucoma    Heart murmur    "slight; not on RX" (06/24/2013)   History of cardiac arrhythmia    cardiologist- Traci Turner   Hx of colonic polyps 08/28/2004   Hyperlipemia    Hypertension    Hypothyroidism    Osteoarthritis of left knee    advanced   PONV (postoperative nausea and vomiting)    Pt reports symptoms are the result of  gallbladder and cholecystectomy, not anesthesia   PVC's (premature ventricular contractions)    Sleep apnea 1990's   "tested; tried mask; couldn't stand it; told me as long as I slept on my side I'd be ok" (06/24/2013)   Thyroid nodule    Tobacco abuse     Past Surgical History:  Procedure Laterality Date   ABDOMINAL HYSTERECTOMY  1988   "partial" (06/24/2013)   ABDOMINAL HYSTERECTOMY     partial in 1988   ANKLE SURGERY Left 1995   "tendon repair" (06/24/2013)   BACK SURGERY     "think today was my 8th back OR" (06/24/2013)   CERVICAL SPINE SURGERY  2012   CESAREAN SECTION  1972   CHOLECYSTECTOMY N/A 04/16/2013   Procedure: LAPAROSCOPIC CHOLECYSTECTOMY ;  Surgeon: Wilmon Arms. Tsuei, MD;  Location: [facility];  Service: General;  Laterality: N/A;   COLONOSCOPY     ELBOW SURGERY  1990's   FOOT SURGERY Right 2012   SPUR REMOVED   INFUSION PUMP IMPLANTATION  1990's   "implantablet morphine pump; took it out w/in 11 months   KNEE ARTHROSCOPY Left 1991; ~ 1993   LAPAROSCOPIC LYSIS OF ADHESIONS N/A 04/16/2013   Procedure: LAPAROSCOPIC LYSIS OF ADHESIONS;  Surgeon: Wilmon Arms. Tsuei, MD;  Location: [facility];  Service: General;  Laterality: N/A;   LUMBAR FUSION     and rods   LUMBAR LAMINECTOMY/DECOMPRESSION MICRODISCECTOMY N/A 11/28/2012   Procedure: DECOMPRESSIVE LUMBAR LAMINECTOMY LEVEL 1;  Surgeon: Mariam Dollar, MD;  Location: [facility];  Service: Neurosurgery;  Laterality: N/A;  DECOMPRESSIVE LUMBAR LAMINECTOMY LEVEL 1   ORIF DISTAL RADIUS FRACTURE Left 12/30/2013   dr Melvyn Novas   ORIF WRIST FRACTURE Left 12/30/2013   Procedure: OPEN REDUCTION INTERNAL FIXATION (ORIF) LEFT WRIST FRACTURE AND REPAIR AS INDICATED;  Surgeon: Sharma Covert, MD;  Location: [facility];  Service: Orthopedics;  Laterality: Left;   POLYPECTOMY     POSTERIOR FUSION LUMBAR SPINE  06/24/2013   ROBOTIC ASSISTED SALPINGO OOPHERECTOMY Bilateral 08/20/2014   Procedure: ROBOTIC ASSISTED SALPINGO OOPHORECTOMY;  Surgeon: Loney Laurence, MD;  Location: [facility];  Service: Gynecology;  Laterality: Bilateral;   SHOULDER ARTHROSCOPY Left 09/2011   THYROIDECTOMY  2012   TOTAL HIP ARTHROPLASTY Left 04/27/2019   Procedure: TOTAL HIP ARTHROPLASTY ANTERIOR APPROACH;  Surgeon: Ollen Gross, MD;  Location: [facility];  Service: Orthopedics;  Laterality: Left;    TOTAL HIP ARTHROPLASTY Right 09/09/2019   Procedure: TOTAL HIP ARTHROPLASTY ANTERIOR APPROACH;  Surgeon: Ollen Gross, MD;  Location: [facility];  Service: Orthopedics;  Laterality: Right;    TOTAL KNEE ARTHROPLASTY Right 09/09/2017   Procedure: RIGHT TOTAL KNEE ARTHROPLASTY;  Surgeon: Ollen Gross, MD;  Location: [facility];  Service: Orthopedics;  Laterality: Right;   UPPER GASTROINTESTINAL ENDOSCOPY     VITRECTOMY Right 10/18/2020    Family History  Problem Relation Age of Onset   Pancreatic cancer Father    Cancer Father    Diabetes type II Mother    Hypertension Mother    Coronary artery disease Mother 62   Diabetes Mother    Thyroid cancer Mother    Skin cancer Mother    Diabetes Sister    Hypertension Sister    Bone cancer Sister    Breast cancer Sister    Hypertension Brother    Breast cancer Maternal Aunt    Other Neg Hx        No family history of  colon cancer   Colon cancer Neg Hx    Colon polyps Neg Hx    Esophageal cancer Neg Hx    Stomach cancer Neg Hx    Rectal cancer Neg Hx     Social History   Socioeconomic History   Marital status: Divorced    Spouse name: Not on file   Number of children: Not on file   Years of education: Not on file   Highest education level: 12th grade  Occupational History   Not on file  Tobacco Use   Smoking status: Every Day    Packs/day: 1.50    Years: 50.00    Additional pack years: 0.00    Total pack years: 75.00    Types: Cigarettes   Smokeless tobacco: Never   Tobacco comments:    10 cigarettes smoked daily ARJ 11/07/21  Vaping Use   Vaping Use: Never used  Substance and Sexual Activity    Alcohol use: No    Alcohol/week: 0.0 standard drinks of alcohol   Drug use: No   Sexual activity: Not Currently  Other Topics Concern   Not on file  Social History Narrative   Divorced   Current Smoker  1 ppd -  20 yrs      Alcohol use-no  [employer] - laid off       Physician roster:   Dr. Loraine Leriche Philips - pain management   Dr. Henderson Cloud - GYN   Dr. Wynetta Emery - Neurosurgery   Dr. Yetta Barre - dermatology   Dr. Melvyn Novas - orthopedics   Dr. Armanda Magic - cardiology   Dr. Miller-ophthalmology   Social Determinants of Health   Financial Resource Strain: Low Risk  (11/16/2021)   Overall Financial Resource Strain (CARDIA)    Difficulty of Paying Living Expenses: Not hard at all  Food Insecurity: No Food Insecurity (10/25/2022)   Hunger Vital Sign    Worried About Running Out of Food in the Last Year: Never true    Ran Out of Food in the Last Year: Never true  Transportation Needs: No Transportation Needs (10/25/2022)   PRAPARE - Administrator, Civil Service (Medical): No    Lack of Transportation (Non-Medical): No  Physical Activity: Inactive (10/25/2022)   Exercise Vital Sign    Days of Exercise per Week: 0 days    Minutes of Exercise per Session: 0 min  Stress: No Stress Concern Present (10/25/2022)   [employer] of Occupational Health - Occupational Stress Questionnaire    Feeling of Stress : Not at all  Social Connections: Unknown (10/25/2022)   Social Connection and Isolation Panel [NHANES]    Frequency of Communication with Friends and Family: Once a week    Frequency of Social Gatherings with Friends and Family: Once a week    Attends Religious Services: Patient declined    Database administrator or Organizations: No    Attends Banker Meetings: Never    Marital Status: Divorced  Catering manager Violence: Not At Risk (11/16/2021)   Humiliation, Afraid, Rape, and Kick questionnaire    Fear of Current or Ex-Partner: No     Emotionally Abused: No    Physically Abused: No    Sexually Abused: No    Outpatient Medications Prior to Visit  Medication Sig Dispense Refill   Acetaminophen (TYLENOL 8 HOUR ARTHRITIS PAIN PO) Take by mouth.     albuterol (VENTOLIN HFA) 108 (90 Base) MCG/ACT inhaler Inhale 2 puffs into the lungs every 6 (six) hours as needed for wheezing or shortness of breath. 6.7 g 2   bisacodyl (DULCOLAX) 5 MG EC tablet Take 5-10 mg by mouth daily as needed for mild constipation.  30 tablet    carisoprodol (SOMA) 350 MG tablet Take 175 mg by mouth 3 (three) times daily.      diclofenac Sodium (VOLTAREN) 1 % GEL as needed.     dorzolamide-timolol (COSOPT) 22.3-6.8 MG/ML ophthalmic solution Place 1 drop into the left eye 2 (two) times daily.     estradiol (ESTRACE) 0.5 MG tablet Take 0.5 mg by mouth at bedtime.   4   fluocinonide ointment (LIDEX) 0.05 % Apply 1 application topically 2 (two) times daily.     fluticasone (FLONASE) 50 MCG/ACT nasal spray Use 2 spray(s) in each nostril once daily 16 g 5   furosemide (LASIX) 40 MG tablet TAKE 2 TABLETS BY MOUTH ONCE DAILY . 180 tablet 1   ipratropium (ATROVENT) 0.06 % nasal spray Place 2 sprays into both nostrils 2 (two) times daily as needed for rhinitis. 15 mL 5   levothyroxine (SYNTHROID) 88 MCG tablet Take 1 tablet (88 mcg total) by mouth daily before breakfast. 90 tablet 3   NONFORMULARY OR COMPOUNDED ITEM Compression socks  20-30 mm/ hg   Dx low  ext edema 1 each 1   oxyCODONE-acetaminophen (PERCOCET) 10-325 MG tablet Take 1 tablet by mouth every four hours as needed for pain 160 tablet 0   potassium chloride SA (KLOR-CON M20) 20 MEQ tablet TAKE 4 TABLETS BY MOUTH DAILY 270 tablet 2   simvastatin (ZOCOR) 20 MG tablet Take 1 tablet (20 mg total) by mouth at bedtime. 90 tablet 0   spironolactone (ALDACTONE) 25 MG tablet Take 1 tablet (25 mg total) by mouth 2 (two) times daily. 180 tablet 3   traZODone (DESYREL) 50 MG tablet Take 1 tablet (50 mg total) by  mouth at bedtime. 90 tablet 1   TRELEGY ELLIPTA 100-62.5-25 MCG/ACT AEPB Inhale 1 puff into the lungs daily. 3 each 3   triamcinolone cream (KENALOG) 0.1 % Apply topically.     omeprazole (PRILOSEC) 20 MG capsule Take 2 capsules by mouth once daily 180 capsule 1   oxyCODONE-acetaminophen (PERCOCET) 10-325 MG tablet Take 1 tablet by mouth every 4 (four) hours as needed for pain. 160 tablet 0   oxyCODONE-acetaminophen (PERCOCET) 10-325 MG tablet Take 1 tablet by mouth every 4 (four) hours. as needed for pain 160 tablet 0   No facility-administered medications prior to visit.    Allergies  Allergen Reactions   Morphine And Related Swelling   5-Alpha Reductase Inhibitors    Amitriptyline Swelling    Leg swelling   Augmentin [Amoxicillin-Pot Clavulanate] Nausea And Vomiting   Gabapentin Itching   Triamterene Itching and Rash    Review of Systems  Constitutional:  Negative for fever and malaise/fatigue.  HENT:  Positive for congestion and sore throat.        (+)sneezing (+)rhinorrhea   Eyes:  Negative for blurred vision.  Respiratory:  Negative for cough and shortness of breath.   Cardiovascular:  Positive for leg swelling (bilateral). Negative for chest pain and palpitations.  Gastrointestinal:  Negative for vomiting.  Musculoskeletal:  Negative for back pain.  Skin:  Negative for rash.       (+)redness over both lower legs  Neurological:  Negative for loss of consciousness and headaches.       Objective:    Physical Exam Vitals and nursing note reviewed.  Constitutional:      General: She is not in acute distress.    Appearance: Normal appearance. She is not ill-appearing.  HENT:     Head: Normocephalic and atraumatic.     Right Ear: External ear normal.     Left Ear: External ear normal.  Eyes:     Extraocular Movements: Extraocular movements intact.     Pupils: Pupils are equal, round, and reactive to light.  Cardiovascular:     Rate and Rhythm: Normal rate and  regular rhythm.     Heart sounds: Murmur heard.     Systolic murmur is present with a grade of 2/6.     Diastolic murmur is present with a grade of 2/4.     No gallop.  Pulmonary:     Effort: Pulmonary effort is normal. No respiratory distress.     Breath sounds: Normal breath sounds. No wheezing or rales.  Skin:    General: Skin is warm and dry.     Coloration: Pallor: bilateral lower legs.     Findings: Erythema present.     Comments: No drainage on lower legs  Neurological:     Mental Status: She is alert and oriented to person, place, and time.  Psychiatric:        Judgment: Judgment  normal.     BP 114/78 (BP Location: Left Arm, Patient Position: Sitting, Cuff Size: Large)   Pulse 60   Temp 98 F (36.7 C) (Oral)   Resp 18   Ht 5' (1.524 m)   Wt 176 lb 12.8 oz (80.2 kg)   SpO2 97%   BMI 34.53 kg/m  Wt Readings from Last 3 Encounters:  11/13/22 176 lb 12.8 oz (80.2 kg)  08/28/22 174 lb 9.6 oz (79.2 kg)  08/02/22 175 lb (79.4 kg)       Assessment & Plan:  Edema, unspecified type Assessment & Plan: Con't wrapping and lasix Elevate legs   Orders: -     CBC with Differential/Platelet; Future -     Comprehensive metabolic panel; Future -     TSH; Future  Gastroesophageal reflux disease, unspecified whether esophagitis present -     Omeprazole; Take 2 capsules by mouth once daily  Dispense: 180 capsule; Refill: 1  Hyperlipidemia, unspecified hyperlipidemia type Assessment & Plan: Encourage heart healthy diet such as MIND or DASH diet, increase exercise, avoid trans fats, simple carbohydrates and processed foods, consider a krill or fish or flaxseed oil cap daily.    Orders: -     Comprehensive metabolic panel; Future -     Lipid panel; Future  Primary hypertension -     CBC with Differential/Platelet; Future -     Comprehensive metabolic panel; Future -     Lipid panel; Future -     TSH; Future  Hyperglycemia -     Hemoglobin A1c;  Future  Hypothyroidism, unspecified type Assessment & Plan:                 Allergy, initial encounter -     Loratadine; Take 1 tablet (10 mg total) by mouth daily.  Dispense: 30 tablet; Refill: 11  Lower extremity edema Assessment & Plan: Con't with leg wraps and diuretics  Elevate legs +start antibiotics      I, Donato Schultz, DO, personally preformed the services described in this documentation.  All medical record entries made by the scribe were at my direction and in my presence.  I have reviewed the chart and discharge instructions (if applicable) and agree that the record reflects my personal performance and is accurate and complete. 11/13/2022   I,[name],acting as a scribe for Donato Schultz, DO.,have documented all relevant documentation on the behalf of Donato Schultz, DO,as directed by  Donato Schultz, DO while in the presence of Donato Schultz, DO.   Donato Schultz, DO

## 2022-11-13 NOTE — Assessment & Plan Note (Signed)
Encourage heart healthy diet such as MIND or DASH diet, increase exercise, avoid trans fats, simple carbohydrates and processed foods, consider a krill or fish or flaxseed oil cap daily.  °

## 2022-11-13 NOTE — Assessment & Plan Note (Signed)
Con't wrapping and lasix Elevate legs

## 2022-11-14 ENCOUNTER — Other Ambulatory Visit (HOSPITAL_COMMUNITY): Payer: Self-pay

## 2022-11-14 MED ORDER — OXYCODONE-ACETAMINOPHEN 10-325 MG PO TABS
1.0000 | ORAL_TABLET | ORAL | 0 refills | Status: DC | PRN
  Filled 2022-11-14: qty 160, 27d supply, fill #0

## 2022-11-16 ENCOUNTER — Other Ambulatory Visit (HOSPITAL_COMMUNITY): Payer: Self-pay

## 2022-11-19 ENCOUNTER — Ambulatory Visit (INDEPENDENT_AMBULATORY_CARE_PROVIDER_SITE_OTHER): Payer: Medicare Other | Admitting: *Deleted

## 2022-11-19 DIAGNOSIS — Z0000 Encounter for general adult medical examination without abnormal findings: Secondary | ICD-10-CM

## 2022-11-19 NOTE — Progress Notes (Signed)
Subjective:  Pt completed ADLs, Fall risk, and SDOH during e-check-in on 11/18/22.  Answers verified with pt.     Joan [name] is a 77 y.o. female who presents for Medicare Annual (Subsequent) preventive examination.  I connected with  DANELL COVARRUBIAS on 11/19/22 by a audio enabled telemedicine application and verified that I am speaking with the correct person using two identifiers.  Patient Location: Home  Provider Location: Office/Clinic  I discussed the limitations of evaluation and management by telemedicine. The patient expressed understanding and agreed to proceed.   Review of Systems     Cardiac Risk Factors include: advanced age (>47men, >19 women);dyslipidemia;hypertension;obesity (BMI >30kg/m2)     Objective:    Today's Vitals   11/18/22 2000  PainSc: 5    There is no height or weight on file to calculate BMI.     11/19/2022    2:28 PM 11/16/2021    2:27 PM 11/10/2020    2:37 PM 10/12/2019    1:50 PM 09/09/2019   12:00 PM 09/09/2019    7:14 AM 09/02/2019    1:12 PM  Advanced Directives  Does Patient Have a Medical Advance Directive? No No No No No No No  Would patient like information on creating a medical advance directive? No - Patient declined No - Patient declined No - Patient declined No - Patient declined No - Patient declined No - Patient declined No - Patient declined    Current Medications (verified) Outpatient Encounter Medications as of 11/19/2022  Medication Sig   Acetaminophen (TYLENOL 8 HOUR ARTHRITIS PAIN PO) Take by mouth.   albuterol (VENTOLIN HFA) 108 (90 Base) MCG/ACT inhaler Inhale 2 puffs into the lungs every 6 (six) hours as needed for wheezing or shortness of breath.   bisacodyl (DULCOLAX) 5 MG EC tablet Take 5-10 mg by mouth daily as needed for mild constipation.    carisoprodol (SOMA) 350 MG tablet Take 175 mg by mouth 3 (three) times daily.    diclofenac Sodium (VOLTAREN) 1 % GEL as needed.   dorzolamide-timolol (COSOPT) 22.3-6.8  MG/ML ophthalmic solution Place 1 drop into the left eye 2 (two) times daily.   estradiol (ESTRACE) 0.5 MG tablet Take 0.5 mg by mouth at bedtime.    fluocinonide ointment (LIDEX) 0.05 % Apply 1 application topically 2 (two) times daily.   fluticasone (FLONASE) 50 MCG/ACT nasal spray Use 2 spray(s) in each nostril once daily   furosemide (LASIX) 40 MG tablet TAKE 2 TABLETS BY MOUTH ONCE DAILY .   ipratropium (ATROVENT) 0.06 % nasal spray Place 2 sprays into both nostrils 2 (two) times daily as needed for rhinitis.   levothyroxine (SYNTHROID) 88 MCG tablet Take 1 tablet (88 mcg total) by mouth daily before breakfast.   loratadine (CLARITIN) 10 MG tablet Take 1 tablet (10 mg total) by mouth daily.   NONFORMULARY OR COMPOUNDED ITEM Compression socks  20-30 mm/ hg   Dx low ext edema   omeprazole (PRILOSEC) 20 MG capsule Take 2 capsules by mouth once daily   oxyCODONE-acetaminophen (PERCOCET) 10-325 MG tablet Take 1 tablet by mouth every four hours as needed for pain   potassium chloride SA (KLOR-CON M20) 20 MEQ tablet TAKE 4 TABLETS BY MOUTH DAILY   simvastatin (ZOCOR) 20 MG tablet Take 1 tablet (20 mg total) by mouth at bedtime.   spironolactone (ALDACTONE) 25 MG tablet Take 1 tablet (25 mg total) by mouth 2 (two) times daily.   traZODone (DESYREL) 50 MG tablet Take 1 tablet (  50 mg total) by mouth at bedtime.   TRELEGY ELLIPTA 100-62.5-25 MCG/ACT AEPB Inhale 1 puff into the lungs daily.   triamcinolone cream (KENALOG) 0.1 % Apply topically.   [DISCONTINUED] oxyCODONE-acetaminophen (PERCOCET) 10-325 MG tablet Take 1 tablet by mouth every 4 (four) hours as needed for pain   No facility-administered encounter medications on file as of 11/19/2022.    Allergies (verified) Morphine and related, 5-alpha reductase inhibitors, Amitriptyline, Augmentin [amoxicillin-pot clavulanate], Gabapentin, and Triamterene   History: Past Medical History:  Diagnosis Date   Allergy ?   Arthritis    "shoulders;  wrist; probably spine" (06/24/2013)   Blood transfusion without reported diagnosis    Cancer (HCC)    skin cancer right leg x 4     Cataract    removed both eyes    Chronic low back pain    followed by Dr Vear Clock pain mgt   Colon polyp    Constipation    COPD (chronic obstructive pulmonary disease) (HCC)    mild   Esophageal stricture    Finger pain, left    2 fingers on left hand since wrist surgery   GERD (gastroesophageal reflux disease)    Glaucoma    Heart murmur    "slight; not on RX" (06/24/2013)   History of cardiac arrhythmia    cardiologist- Traci Turner   Hx of colonic polyps 08/28/2004   Hyperlipemia    Hypertension    Hypothyroidism    Osteoarthritis of left knee    advanced   Osteoporosis ?   PONV (postoperative nausea and vomiting)    Pt reports symptoms are the result of gallbladder and cholecystectomy, not anesthesia   PVC's (premature ventricular contractions)    Sleep apnea 1990's   "tested; tried mask; couldn't stand it; told me as long as I slept on my side I'd be ok" (06/24/2013)   Thyroid nodule    Tobacco abuse    Past Surgical History:  Procedure Laterality Date   ABDOMINAL HYSTERECTOMY  1988   "partial" (06/24/2013)   ABDOMINAL HYSTERECTOMY     partial in 1988   ANKLE SURGERY Left 1995   "tendon repair" (06/24/2013)   BACK SURGERY     "think today was my 8th back OR" (06/24/2013)   CERVICAL SPINE SURGERY  2012   CESAREAN SECTION  1972   CHOLECYSTECTOMY N/A 04/16/2013   Procedure: LAPAROSCOPIC CHOLECYSTECTOMY ;  Surgeon: Wilmon Arms. Corliss Skains, MD;  Location: [facility];  Service: General;  Laterality: N/A;   COLONOSCOPY     ELBOW SURGERY  1990's   EYE SURGERY  ?   FOOT SURGERY Right 2012   SPUR REMOVED   FRACTURE SURGERY  ?   INFUSION PUMP IMPLANTATION  1990's   "implantablet morphine pump; took it out w/in 11 months   JOINT REPLACEMENT  ?   KNEE ARTHROSCOPY Left 1991; ~ 1993   LAPAROSCOPIC LYSIS OF ADHESIONS N/A 04/16/2013   Procedure:  LAPAROSCOPIC LYSIS OF ADHESIONS;  Surgeon: Wilmon Arms. Corliss Skains, MD;  Location: [facility];  Service: General;  Laterality: N/A;   LUMBAR FUSION     and rods   LUMBAR LAMINECTOMY/DECOMPRESSION MICRODISCECTOMY N/A 11/28/2012   Procedure: DECOMPRESSIVE LUMBAR LAMINECTOMY LEVEL 1;  Surgeon: Mariam Dollar, MD;  Location: [facility];  Service: Neurosurgery;  Laterality: N/A;  DECOMPRESSIVE LUMBAR LAMINECTOMY LEVEL 1   ORIF DISTAL RADIUS FRACTURE Left 12/30/2013   dr Melvyn Novas   ORIF WRIST FRACTURE Left 12/30/2013   Procedure: OPEN REDUCTION INTERNAL FIXATION (ORIF) LEFT WRIST  FRACTURE AND REPAIR AS INDICATED;  Surgeon: Sharma Covert, MD;  Location: [facility];  Service: Orthopedics;  Laterality: Left;   POLYPECTOMY     POSTERIOR FUSION LUMBAR SPINE  06/24/2013   ROBOTIC ASSISTED SALPINGO OOPHERECTOMY Bilateral 08/20/2014   Procedure: ROBOTIC ASSISTED SALPINGO OOPHORECTOMY;  Surgeon: Loney Laurence, MD;  Location: [facility];  Service: Gynecology;  Laterality: Bilateral;   SHOULDER ARTHROSCOPY Left 09/2011   SPINE SURGERY  8 times   THYROIDECTOMY  2012   TOTAL HIP ARTHROPLASTY Left 04/27/2019   Procedure: TOTAL HIP ARTHROPLASTY ANTERIOR APPROACH;  Surgeon: Ollen Gross, MD;  Location: [facility];  Service: Orthopedics;  Laterality: Left;    TOTAL HIP ARTHROPLASTY Right 09/09/2019   Procedure: TOTAL HIP ARTHROPLASTY ANTERIOR APPROACH;  Surgeon: Ollen Gross, MD;  Location: [facility];  Service: Orthopedics;  Laterality: Right;    TOTAL KNEE ARTHROPLASTY Right 09/09/2017   Procedure: RIGHT TOTAL KNEE ARTHROPLASTY;  Surgeon: Ollen Gross, MD;  Location: [facility];  Service: Orthopedics;  Laterality: Right;   TUBAL LIGATION  ?   UPPER GASTROINTESTINAL ENDOSCOPY     VITRECTOMY Right 10/18/2020   Family History  Problem Relation Age of Onset   Pancreatic cancer Father    Cancer Father    Diabetes type II Mother    Hypertension Mother    Coronary artery disease Mother 16   Diabetes Mother    Thyroid  cancer Mother    Skin cancer Mother    Diabetes Sister    Hypertension Sister    Bone cancer Sister    Breast cancer Sister    Hypertension Brother    Breast cancer Maternal Aunt    Other Neg Hx        No family history of  colon cancer   Colon cancer Neg Hx    Colon polyps Neg Hx    Esophageal cancer Neg Hx    Stomach cancer Neg Hx    Rectal cancer Neg Hx    Social History   Socioeconomic History   Marital status: Divorced    Spouse name: Not on file   Number of children: Not on file   Years of education: Not on file   Highest education level: 12th grade  Occupational History   Not on file  Tobacco Use   Smoking status: Every Day    Packs/day: 1.50    Years: 50.00    Additional pack years: 0.00    Total pack years: 75.00    Types: Cigarettes   Smokeless tobacco: Never   Tobacco comments:    10 cigarettes smoked daily ARJ 11/07/21  Vaping Use   Vaping Use: Never used  Substance and Sexual Activity   Alcohol use: No    Alcohol/week: 0.0 standard drinks of alcohol   Drug use: No   Sexual activity: Not Currently  Other Topics Concern   Not on file  Social History Narrative   Divorced   Current Smoker  1 ppd -  20 yrs      Alcohol use-no       [employer] - laid off       Physician roster:   Dr. Loraine Leriche Philips - pain management   Dr. Henderson Cloud - GYN   Dr. Wynetta Emery - Neurosurgery   Dr. Yetta Barre - dermatology   Dr. Melvyn Novas - orthopedics   Dr. Armanda Magic - cardiology   Dr. Miller-ophthalmology   Social Determinants of Health   Financial Resource Strain: Low Risk  (11/18/2022)  Overall Financial Resource Strain (CARDIA)    Difficulty of Paying Living Expenses: Not very hard  Food Insecurity: No Food Insecurity (11/18/2022)   Hunger Vital Sign    Worried About Running Out of Food in the Last Year: Never true    Ran Out of Food in the Last Year: Never true  Transportation Needs: No Transportation Needs (11/18/2022)   PRAPARE - Scientist, research (physical sciences) (Medical): No    Lack of Transportation (Non-Medical): No  Physical Activity: Insufficiently Active (11/18/2022)   Exercise Vital Sign    Days of Exercise per Week: 2 days    Minutes of Exercise per Session: 10 min  Stress: No Stress Concern Present (11/18/2022)   [employer] of Occupational Health - Occupational Stress Questionnaire    Feeling of Stress : Not at all  Social Connections: Unknown (11/18/2022)   Social Connection and Isolation Panel [NHANES]    Frequency of Communication with Friends and Family: Twice a week    Frequency of Social Gatherings with Friends and Family: Once a week    Attends Religious Services: Patient declined    Database administrator or Organizations: No    Attends Engineer, structural: Never    Marital Status: Divorced    Tobacco Counseling Ready to quit: Not Answered Counseling given: Not Answered Tobacco comments: 10 cigarettes smoked daily ARJ 11/07/21   Clinical Intake:  Pre-visit preparation completed: Yes  Pain : 0-10 Pain Score: 5  Pain Location: Back Pain Orientation: Mid Pain Descriptors / Indicators: Aching, Constant Pain Onset: More than a month ago Pain Frequency: Constant  BMI - recorded: 34.53 Nutritional Status: BMI > 30  Obese Nutritional Risks: None Diabetes: No  How often do you need to have someone help you when you read instructions, pamphlets, or other written materials from your doctor or pharmacy?: 1 - Never   Activities of Daily Living    11/18/2022    8:00 PM  In your present state of health, do you have any difficulty performing the following activities:  Hearing? 0  Vision? 0  Difficulty concentrating or making decisions? 0  Walking or climbing stairs? 0  Dressing or bathing? 0  Doing errands, shopping? 0  Preparing Food and eating ? N  Using the Toilet? N  In the past six months, have you accidently leaked urine? N  Do you have problems with loss of bowel control? N   Managing your Medications? N  Managing your Finances? N  Housekeeping or managing your Housekeeping? N    Patient Care Team: Zola Button, Grayling Congress, DO as PCP - General (Family Medicine) O'Neal, Ronnald Ramp, MD as PCP - Cardiology (Cardiology) Thyra Breed, MD as Consulting Physician (Anesthesiology) Donalee Citrin, MD as Consulting Physician (Neurosurgery) Bradly Bienenstock, MD as Consulting Physician (Orthopedic Surgery) Francena Hanly, MD as Consulting Physician (Orthopedic Surgery) Donzetta Starch, MD as Consulting Physician (Dermatology) Carrington Clamp, MD as Consulting Physician (Obstetrics and Gynecology) Antonietta Barcelona, OD as Referring Physician (Optometry) Iva Boop, MD as Consulting Physician (Gastroenterology) Ollen Gross, MD as Consulting Physician (Orthopedic Surgery) Ollen Gross, MD as Consulting Physician (Orthopedic Surgery) [facility] [place] ([place]) Luciano Cutter, MD as Consulting Physician (Pulmonary Disease)  Indicate any recent Medical Services you may have received from other than [facility] providers in the past year (date may be approximate).     Assessment:   This is a routine wellness examination for [place].  Hearing/Vision screen No results found.  Dietary issues  and exercise activities discussed: Current Exercise Habits: The patient does not participate in regular exercise at present, Exercise limited by: orthopedic condition(s)   Goals Addressed   None    Depression Screen    11/19/2022    2:25 PM 07/19/2022    2:02 PM 11/16/2021    1:29 PM 11/10/2020    2:39 PM 11/10/2020    2:07 PM 10/12/2019    1:54 PM 07/01/2017   10:24 AM  PHQ 2/9 Scores  PHQ - 2 Score 0 0 0 0 0 0 0    Fall Risk    11/18/2022    8:00 PM 11/13/2022    1:39 PM 11/16/2021    2:25 PM 11/16/2021    1:29 PM 11/10/2020    2:38 PM  Fall Risk   Falls in the past year? 0 0 0 0 0  Number falls in past yr: 0 0 0 0 0  Injury with Fall? 0 0 0 0 0  Risk for  fall due to : No Fall Risks Impaired mobility;Impaired balance/gait No Fall Risks    Follow up Falls evaluation completed Falls evaluation completed Falls evaluation completed Falls evaluation completed Falls prevention discussed    FALL RISK PREVENTION PERTAINING TO THE HOME:  Any stairs in or around the home? No  Home free of loose throw rugs in walkways, pet beds, electrical cords, etc? Yes  Adequate lighting in your home to reduce risk of falls? Yes   ASSISTIVE DEVICES UTILIZED TO PREVENT FALLS:  Life alert? No  Use of a cane, walker or w/c? Yes Grab bars in the bathroom? Yes  Shower chair or bench in shower? No  Elevated toilet seat or a handicapped toilet? Yes   TIMED UP AND GO:  Was the test performed?  No, audio visit .   Cognitive Function:    05/22/2016    2:03 PM 09/28/2015    2:22 PM  MMSE - Mini Mental State Exam  Orientation to time 5 5  Orientation to Place 5 5  Registration 3 3  Attention/ Calculation 5 5  Recall 3 3  Language- name 2 objects 2 2  Language- repeat 1 1  Language- follow 3 step command 3 3  Language- read & follow direction 1 1  Write a sentence 1 1  Copy design 1 1  Total score 30 30        11/16/2021    2:31 PM  6CIT Screen  What Year? 0 points  What month? 0 points  What time? 0 points  Count back from 20 0 points  Months in reverse 0 points  Repeat phrase 2 points  Total Score 2 points    Immunizations Immunization History  Administered Date(s) Administered   Fluad Quad(high Dose 65+) 04/03/2019, 04/20/2020, 03/30/2022   Influenza Split 03/28/2012   Influenza Whole 04/28/2007, 05/25/2008, 04/19/2009, 04/18/2010   Influenza, High Dose Seasonal PF 04/13/2014, 04/04/2015, 04/30/2016, 03/14/2017, 04/29/2018   Influenza,inj,Quad PF,6+ Mos 03/06/2013   Influenza,inj,quad, With Preservative 04/08/2017   Influenza-Unspecified 04/08/2018, 09/30/2021   Moderna Covid-19 Vaccine Bivalent Booster 64yrs & up 11/16/2021   Moderna  Sars-Covid-2 Vaccination 10/19/2019, 11/16/2019, 05/31/2020   Pneumococcal Conjugate-13 06/07/2014   Pneumococcal Polysaccharide-23 08/12/2007, 10/18/2015   Td 10/23/2004, 09/08/2014   Td (Adult), 2 Lf Tetanus Toxid, Preservative Free 10/23/2004, 09/08/2014   Zoster, Live 03/28/2012    TDAP status: Up to date  Flu Vaccine status: Up to date  Pneumococcal vaccine status: Up to date  Covid-19 vaccine status: Information  provided on how to obtain vaccines.   Qualifies for Shingles Vaccine? Yes   Zostavax completed Yes   Shingrix Completed?: No.    Education has been provided regarding the importance of this vaccine. Patient has been advised to call insurance company to determine out of pocket expense if they have not yet received this vaccine. Advised may also receive vaccine at local pharmacy or Health Dept. Verbalized acceptance and understanding.  Screening Tests Health Maintenance  Topic Date Due   Zoster Vaccines- Shingrix (1 of 2) Never done   COVID-19 Vaccine (5 - 2023-24 season) 03/09/2022   Medicare Annual Wellness (AWV)  11/17/2022   INFLUENZA VACCINE  02/07/2023   Lung Cancer Screening  02/27/2023   MAMMOGRAM  07/11/2023   DTaP/Tdap/Td (4 - Tdap) 09/07/2024   COLONOSCOPY (Pts 45-68yrs Insurance coverage will need to be confirmed)  12/02/2025   Pneumonia Vaccine 66+ Years old  Completed   DEXA SCAN  Completed   Hepatitis C Screening  Completed   HPV VACCINES  Aged Out    Health Maintenance  Health Maintenance Due  Topic Date Due   Zoster Vaccines- Shingrix (1 of 2) Never done   COVID-19 Vaccine (5 - 2023-24 season) 03/09/2022   Medicare Annual Wellness (AWV)  11/17/2022    Colorectal cancer screening: Type of screening: Colonoscopy. Completed 12/02/20. Repeat every 5 years  Mammogram status: Completed 07/10/22. Repeat every year  Bone Density status: Pt declined  Lung Cancer Screening: (Low Dose CT Chest recommended if Age 26-80 years, 30 pack-year currently  smoking OR have quit w/in 15years.) does qualify.   Lung Cancer Screening Referral: already placed.  Scheduled for 11/20/22  Additional Screening:  Hepatitis C Screening: does qualify; Completed 04/04/15  Vision Screening: Recommended annual ophthalmology exams for early detection of glaucoma and other disorders of the eye. Is the patient up to date with their annual eye exam?  Yes  Who is the provider or what is the name of the office in which the patient attends annual eye exams? Dr. Hyacinth Meeker If pt is not established with a provider, would they like to be referred to a provider to establish care? No .   Dental Screening: Recommended annual dental exams for proper oral hygiene  Community Resource Referral / Chronic Care Management: CRR required this visit?  No   CCM required this visit?  No      Plan:     I have personally reviewed and noted the following in the patient's chart:   Medical and social history Use of alcohol, tobacco or illicit drugs  Current medications and supplements including opioid prescriptions. Patient is currently taking opioid prescriptions. Information provided to patient regarding non-opioid alternatives. Patient advised to discuss non-opioid treatment plan with their provider. Functional ability and status Nutritional status Physical activity Advanced directives List of other physicians Hospitalizations, surgeries, and ER visits in previous 12 months Vitals Screenings to include cognitive, depression, and falls Referrals and appointments  In addition, I have reviewed and discussed with patient certain preventive protocols, quality metrics, and best practice recommendations. A written personalized care plan for preventive services as well as general preventive health recommendations were provided to patient.   Due to this being a telephonic visit, the after visit summary with patients personalized plan was offered to patient via mail or my-chart.   Patient would like to access on my-chart.   Donne Anon, [place]   11/19/2022   Nurse Notes: None

## 2022-11-20 ENCOUNTER — Ambulatory Visit (HOSPITAL_COMMUNITY)
Admission: RE | Admit: 2022-11-20 | Discharge: 2022-11-20 | Disposition: A | Discharge status: Home / Self Care | Payer: Medicare Other | Source: Ambulatory Visit | Attending: Pulmonary Disease | Admitting: Pulmonary Disease

## 2022-11-20 DIAGNOSIS — R9389 Abnormal findings on diagnostic imaging of other specified body structures: Secondary | ICD-10-CM

## 2022-11-20 DIAGNOSIS — R918 Other nonspecific abnormal finding of lung field: Secondary | ICD-10-CM | POA: Diagnosis not present

## 2022-11-20 DIAGNOSIS — J432 Centrilobular emphysema: Secondary | ICD-10-CM | POA: Diagnosis not present

## 2022-11-28 ENCOUNTER — Ambulatory Visit (HOSPITAL_BASED_OUTPATIENT_CLINIC_OR_DEPARTMENT_OTHER): Payer: Medicare Other | Admitting: Pulmonary Disease

## 2022-12-18 ENCOUNTER — Other Ambulatory Visit (HOSPITAL_COMMUNITY): Payer: Self-pay

## 2022-12-18 MED ORDER — OXYCODONE-ACETAMINOPHEN 10-325 MG PO TABS
1.0000 | ORAL_TABLET | ORAL | 0 refills | Status: DC | PRN
  Filled 2022-12-18: qty 160, 27d supply, fill #0

## 2022-12-19 ENCOUNTER — Other Ambulatory Visit: Payer: Self-pay | Admitting: Family Medicine

## 2022-12-19 DIAGNOSIS — E785 Hyperlipidemia, unspecified: Secondary | ICD-10-CM

## 2022-12-20 ENCOUNTER — Ambulatory Visit (HOSPITAL_BASED_OUTPATIENT_CLINIC_OR_DEPARTMENT_OTHER): Payer: Medicare Other | Admitting: Pulmonary Disease

## 2022-12-20 ENCOUNTER — Encounter (HOSPITAL_BASED_OUTPATIENT_CLINIC_OR_DEPARTMENT_OTHER): Payer: Self-pay | Admitting: Pulmonary Disease

## 2022-12-20 VITALS — BP 120/64 | HR 75 | Temp 98.2°F | Ht 60.0 in | Wt 176.2 lb

## 2022-12-20 DIAGNOSIS — J449 Chronic obstructive pulmonary disease, unspecified: Secondary | ICD-10-CM | POA: Diagnosis not present

## 2022-12-20 DIAGNOSIS — R9389 Abnormal findings on diagnostic imaging of other specified body structures: Secondary | ICD-10-CM

## 2022-12-20 NOTE — Patient Instructions (Signed)
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   Nasal congestion --CONTINUE atrovent TWO sprays per nostril in the morning. OK to use additional spray if needed --CONTINUE flonase TWO sprays per nostril at night

## 2022-12-20 NOTE — Progress Notes (Signed)
Subjective:   PATIENT ID: Joan [name] GENDER: female DOB: 09-28-1945, MRN: [id number]   HPI  Chief Complaint  Patient presents with   Follow-up    Follow up.     Reason for Visit: Follow-up  Joan [name] is a 77 year old female active smoker with COPD, pulmonary nodules, GERD, hypothyroidism, osteopenia, glaucoma, HTN, HLD hx PVC and chronic lymphedema who presents for follow-up  Initial consult Previously seen by Dr. Delton Coombes for initial consult for COPD, hypoxemia dx by her PCP and CT changes concerning for atypical infection  Denies shortness of breath, cough or wheezing. No limitations in activity except related to her knee problems. She has been on Trelegy but does not feel like it is effective. When her ICS strength was increased, she has had worsening congestion. She is also worried about the side effects related to Trelegy including glaucoma and steroids.  Reported hypoxemia with PCP. However ambulatory O2 in pulmonary with no desaturations on 11/07/21. She reports ONO in May 2023  She currently smokes 1/2 ppd. Previously 1 1/2 ppd. Started at 77 years old. ~ 50 years smoking.  05/25/22 Breo was ineffective. Now back on Trelegy now that she has assistance. Feels this is helpful  12/20/22 Since our last visit she had covid four weeks ago and has overall improved but still having nasal congestion. Using nasal sprays which help some. Compliant with Trelegy daily. Has not needed to use rescue inhaler. Denies cough or wheezing. Does not usually have breathing issues except for a single episode three days ago where she felt like she couldn't catch her breath. Denies syncope or dizziness. No further issues since then. Has been laying these last few weeks with minimal activity.   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   Arthritis    "shoulders; wrist; probably  spine" (06/24/2013)   Blood transfusion without reported diagnosis    Cancer (HCC)    skin cancer right leg x 4     Cataract    removed both eyes    Chronic low back pain    followed by Dr Vear Clock pain mgt   Colon polyp    Constipation    COPD (chronic obstructive pulmonary disease) (HCC)    mild   Esophageal stricture    Finger pain, left    2 fingers on left hand since wrist surgery   GERD (gastroesophageal reflux disease)    Glaucoma    Heart murmur    "slight; not on RX" (06/24/2013)   History of cardiac arrhythmia    cardiologist- Traci Turner   Hx of colonic polyps 08/28/2004   Hyperlipemia    Hypertension    Hypothyroidism    Osteoarthritis of left knee    advanced   Osteoporosis ?   PONV (postoperative nausea and vomiting)    Pt reports symptoms are the result of gallbladder and cholecystectomy, not anesthesia   PVC's (premature ventricular contractions)    Sleep apnea 1990's   "tested; tried mask; couldn't stand it; told me as long as I slept on my side I'd be ok" (06/24/2013)   Thyroid nodule    Tobacco abuse      Family History  Problem Relation Age of Onset   Pancreatic cancer Father    Cancer Father    Diabetes type II Mother    Hypertension Mother  Coronary artery disease Mother 70   Diabetes Mother    Thyroid cancer Mother    Skin cancer Mother    Diabetes Sister    Hypertension Sister    Bone cancer Sister    Breast cancer Sister    Hypertension Brother    Breast cancer Maternal Aunt    Other Neg Hx        No family history of  colon cancer   Colon cancer Neg Hx    Colon polyps Neg Hx    Esophageal cancer Neg Hx    Stomach cancer Neg Hx    Rectal cancer Neg Hx      Social History   Occupational History   Not on file  Tobacco Use   Smoking status: Every Day    Packs/day: 1.50    Years: 50.00    Additional pack years: 0.00    Total pack years: 75.00    Types: Cigarettes   Smokeless tobacco: Never   Tobacco comments:    10  cigarettes smoked daily [facility] 11/07/21  Vaping Use   Vaping Use: Never used  Substance and Sexual Activity   Alcohol use: No    Alcohol/week: 0.0 standard drinks of alcohol   Drug use: No   Sexual activity: Not Currently    Allergies  Allergen Reactions   Morphine And Codeine Swelling   5-Alpha Reductase Inhibitors    Amitriptyline Swelling    Leg swelling   Augmentin [Amoxicillin-Pot Clavulanate] Nausea And Vomiting   Gabapentin Itching   Triamterene Itching and Rash     Outpatient Medications Prior to Visit  Medication Sig Dispense Refill   Acetaminophen (TYLENOL 8 HOUR ARTHRITIS PAIN PO) Take by mouth.     albuterol (VENTOLIN HFA) 108 (90 Base) MCG/ACT inhaler Inhale 2 puffs into the lungs every 6 (six) hours as needed for wheezing or shortness of breath. 6.7 g 2   bisacodyl (DULCOLAX) 5 MG EC tablet Take 5-10 mg by mouth daily as needed for mild constipation.  30 tablet    carisoprodol (SOMA) 350 MG tablet Take 175 mg by mouth 3 (three) times daily.      diclofenac Sodium (VOLTAREN) 1 % GEL as needed.     dorzolamide-timolol (COSOPT) 22.3-6.8 MG/ML ophthalmic solution Place 1 drop into the left eye 2 (two) times daily.     estradiol (ESTRACE) 0.5 MG tablet Take 0.5 mg by mouth at bedtime.   4   fluocinonide ointment (LIDEX) 0.05 % Apply 1 application topically 2 (two) times daily.     fluticasone (FLONASE) 50 MCG/ACT nasal spray Use 2 spray(s) in each nostril once daily 16 g 5   furosemide (LASIX) 40 MG tablet TAKE 2 TABLETS BY MOUTH ONCE DAILY . 180 tablet 1   ipratropium (ATROVENT) 0.06 % nasal spray Place 2 sprays into both nostrils 2 (two) times daily as needed for rhinitis. 15 mL 5   levothyroxine (SYNTHROID) 88 MCG tablet Take 1 tablet (88 mcg total) by mouth daily before breakfast. 90 tablet 3   NONFORMULARY OR COMPOUNDED ITEM Compression socks  20-30 mm/ hg   Dx low ext edema 1 each 1   omeprazole (PRILOSEC) 20 MG capsule Take 2 capsules by mouth once daily 180 capsule 1    oxyCODONE-acetaminophen (PERCOCET) 10-325 MG tablet Take 1 tablet by mouth every four hours as needed for pain 160 tablet 0   oxyCODONE-acetaminophen (PERCOCET) 10-325 MG tablet Take 1 tablet by mouth every 4 hours as needed for pain 160 tablet  0   potassium chloride SA (KLOR-CON M20) 20 MEQ tablet TAKE 4 TABLETS BY MOUTH DAILY 270 tablet 2   simvastatin (ZOCOR) 20 MG tablet Take 1 tablet (20 mg total) by mouth at bedtime. 90 tablet 1   spironolactone (ALDACTONE) 25 MG tablet Take 1 tablet (25 mg total) by mouth 2 (two) times daily. 180 tablet 3   traZODone (DESYREL) 50 MG tablet Take 1 tablet (50 mg total) by mouth at bedtime. 90 tablet 1   TRELEGY ELLIPTA 100-62.5-25 MCG/ACT AEPB Inhale 1 puff into the lungs daily. 3 each 3   triamcinolone cream (KENALOG) 0.1 % Apply topically.     loratadine (CLARITIN) 10 MG tablet Take 1 tablet (10 mg total) by mouth daily. 30 tablet 11   No facility-administered medications prior to visit.    Review of Systems  Constitutional:  Positive for malaise/fatigue. Negative for chills, diaphoresis, fever and weight loss.  HENT:  Positive for congestion.   Respiratory:  Positive for shortness of breath. Negative for cough, hemoptysis, sputum production and wheezing.   Cardiovascular:  Negative for chest pain, palpitations and leg swelling.     Objective:   Vitals:   12/20/22 1351  BP: 120/64  Pulse: 75  Temp: 98.2 F (36.8 C)  TempSrc: Oral  SpO2: 96%  Weight: 176 lb 3.2 oz (79.9 kg)  Height: 5' (1.524 m)   SpO2: 96 % O2 Device: None (Room air)  Physical Exam: General: Well-appearing, no acute distress HENT: [place], AT Eyes: EOMI, no scleral icterus Respiratory: Clear to auscultation bilaterally.  No crackles, wheezing or rales Cardiovascular: RRR, -M/R/G, no JVD Extremities:-Edema,-tenderness Neuro: AAO x4, CNII-XII grossly intact Psych: Normal mood, normal affect   Data Reviewed:  Imaging: CT Chest 11/23/21 - New nodulr RUL  consolidation. Peribronchovascular nodularity progressive from 04/18/20. Emphysema  CT Chest 02/26/22 - Resolved RML nodularity. Unchanged nodularity in RUL  CT Chest 11/24/22 - Stable RLL ground glass nodule since 01/22/20. Emphysema. Unchanged nodularity and mucoid impaction - plikely post-infections  PFT: 01/28/20 FVC 1.88 (80%) FEV1 1.32 (75%) Ratio 68  DLCO 76% Interpretation: Mild obstructive defect with mildly reduced DLCO  Labs: CBC    Component Value Date/Time   WBC 7.3 06/18/2022 1518   RBC 4.51 06/18/2022 1518   HGB 13.6 06/18/2022 1518   HGB 13.2 10/29/2019 1411   HCT 40.1 06/18/2022 1518   HCT 39.7 10/29/2019 1411   PLT 230.0 06/18/2022 1518   PLT 237 10/29/2019 1411   MCV 88.8 06/18/2022 1518   MCV 90 10/29/2019 1411   MCH 30.0 10/29/2019 1411   MCH 30.0 09/10/2019 0421   MCHC 33.9 06/18/2022 1518   RDW 14.2 06/18/2022 1518   RDW 12.7 10/29/2019 1411   LYMPHSABS 1.7 06/18/2022 1518   MONOABS 1.0 06/18/2022 1518   EOSABS 0.1 06/18/2022 1518   BASOSABS 0.1 06/18/2022 1518   Absolute eos 11/16/21 - 100  ONO 11/29/21 Duration 2:49:28    Assessment & Plan:   Discussion: 77 year old female active smoker with COPD, pulmonary nodules, GERD, hypothyroidism, osteopenia, glaucoma, HTN, HLD, hx PVC who presnts for follow-up. Controlled on triple therapy except for single episode of dyspnea when standing up. Overall stable CT findings which was reviewed with patient. Will decrease interval to imaging every other year given that patient is asymptomatic and findings stable. No bronchoscopy warranted.   Emphysema/COPD, mild Post-covid fatigue --CONTINUE Trelegy ONE puff ONCE a day --Encourage daily walking in the house and then start daily exercise with walking when able  Abnormal CT suggestive MAC Waxing and waning pulmonary nodules, thought to be inflammatory --Reviewed CT 02/26/22 with resolved RML nodularity. Overall stable --Plan for CT Chest without contrast in 2  years 11/2024.   Nasal congestion - persistent --CONTINUE atrovent TWO sprays per nostril in the morning. OK to use additional spray if needed --CONTINUE flonase TWO sprays per nostril at night  Health Maintenance Immunization History  Administered Date(s) Administered   Fluad Quad(high Dose 65+) 04/03/2019, 04/20/2020, 03/30/2022   Influenza Split 03/28/2012   Influenza Whole 04/28/2007, 05/25/2008, 04/19/2009, 04/18/2010   Influenza, High Dose Seasonal PF 04/13/2014, 04/04/2015, 04/30/2016, 03/14/2017, 04/29/2018   Influenza,inj,Quad PF,6+ Mos 03/06/2013   Influenza,inj,quad, With Preservative 04/08/2017   Influenza-Unspecified 04/08/2018, 09/30/2021   Moderna Covid-19 Vaccine Bivalent Booster 92yrs & up 11/16/2021   Moderna Sars-Covid-2 Vaccination 10/19/2019, 11/16/2019, 05/31/2020   Pneumococcal Conjugate-13 06/07/2014   Pneumococcal Polysaccharide-23 08/12/2007, 10/18/2015   Td 10/23/2004, 09/08/2014   Td (Adult), 2 Lf Tetanus Toxid, Preservative Free 10/23/2004, 09/08/2014   Zoster, Live 03/28/2012   CT Lung Screen - qualified however under surveillance imaging.  No orders of the defined types were placed in this encounter.  No orders of the defined types were placed in this encounter.   Return in about 6 months (around 06/21/2023).   I have spent a total time of 33-minutes on the day of the appointment including chart review, data review, collecting history, coordinating care and discussing medical diagnosis and plan with the patient/family. Past medical history, allergies, medications were reviewed. Pertinent imaging, labs and tests included in this note have been reviewed and interpreted independently by me.  Jamiah Homeyer Mechele Collin, MD [facility] 12/20/2022 2:23 PM  Office Number [phone]

## 2022-12-21 ENCOUNTER — Other Ambulatory Visit: Payer: Self-pay | Admitting: Internal Medicine

## 2022-12-26 ENCOUNTER — Other Ambulatory Visit (HOSPITAL_COMMUNITY): Payer: Self-pay

## 2022-12-26 DIAGNOSIS — G894 Chronic pain syndrome: Secondary | ICD-10-CM | POA: Diagnosis not present

## 2022-12-26 DIAGNOSIS — M62830 Muscle spasm of back: Secondary | ICD-10-CM | POA: Diagnosis not present

## 2022-12-26 DIAGNOSIS — Z79891 Long term (current) use of opiate analgesic: Secondary | ICD-10-CM | POA: Diagnosis not present

## 2022-12-26 DIAGNOSIS — M961 Postlaminectomy syndrome, not elsewhere classified: Secondary | ICD-10-CM | POA: Diagnosis not present

## 2022-12-26 MED ORDER — OXYCODONE-ACETAMINOPHEN 10-325 MG PO TABS
1.0000 | ORAL_TABLET | ORAL | 0 refills | Status: DC | PRN
  Filled 2023-01-21: qty 160, 27d supply, fill #0

## 2023-01-14 ENCOUNTER — Other Ambulatory Visit: Payer: Self-pay | Admitting: Family Medicine

## 2023-01-14 DIAGNOSIS — R600 Localized edema: Secondary | ICD-10-CM

## 2023-01-21 ENCOUNTER — Other Ambulatory Visit: Payer: Self-pay

## 2023-01-21 ENCOUNTER — Other Ambulatory Visit (HOSPITAL_COMMUNITY): Payer: Self-pay

## 2023-01-24 ENCOUNTER — Telehealth: Payer: Self-pay | Admitting: Family Medicine

## 2023-01-24 ENCOUNTER — Other Ambulatory Visit: Payer: Self-pay | Admitting: Family Medicine

## 2023-01-24 MED ORDER — CEPHALEXIN 500 MG PO CAPS: 500.0000 mg | ORAL_CAPSULE | Freq: Two times a day (BID) | ORAL | 0 refills | Status: DC

## 2023-01-24 NOTE — Telephone Encounter (Signed)
Patient called to advise that she has lymphedema in her legs and one burst and has been draining the last couple of days. She said she has it wrapped but she wants to know if Dr. Laury Axon can call in antibiotic to prevent infection which she has done in the past. Pt said that she doesn't want to come out in the heat because it affects her breathing. Please call to advise

## 2023-01-28 ENCOUNTER — Other Ambulatory Visit: Payer: Self-pay

## 2023-01-28 MED ORDER — LEVOTHYROXINE SODIUM 88 MCG PO TABS: 88.0000 ug | ORAL_TABLET | Freq: Every day | ORAL | 3 refills | Status: DC

## 2023-01-28 NOTE — Telephone Encounter (Signed)
Prescription has been sent to [facility] manufacturer change

## 2023-02-14 ENCOUNTER — Other Ambulatory Visit: Payer: Self-pay | Admitting: Family Medicine

## 2023-02-14 ENCOUNTER — Ambulatory Visit: Payer: Medicare Other | Admitting: Family Medicine

## 2023-02-14 DIAGNOSIS — G4700 Insomnia, unspecified: Secondary | ICD-10-CM

## 2023-02-18 ENCOUNTER — Other Ambulatory Visit: Payer: Self-pay | Admitting: Family Medicine

## 2023-02-18 ENCOUNTER — Ambulatory Visit (INDEPENDENT_AMBULATORY_CARE_PROVIDER_SITE_OTHER): Payer: Medicare Other | Admitting: Family Medicine

## 2023-02-18 ENCOUNTER — Ambulatory Visit (HOSPITAL_BASED_OUTPATIENT_CLINIC_OR_DEPARTMENT_OTHER)
Admission: RE | Admit: 2023-02-18 | Discharge: 2023-02-18 | Disposition: A | Discharge status: Home / Self Care | Payer: Medicare Other | Source: Ambulatory Visit | Attending: Family Medicine | Admitting: Family Medicine

## 2023-02-18 VITALS — BP 110/70 | HR 80 | Temp 97.6°F | Resp 18 | Ht 60.0 in | Wt 174.4 lb

## 2023-02-18 DIAGNOSIS — J439 Emphysema, unspecified: Secondary | ICD-10-CM

## 2023-02-18 DIAGNOSIS — I890 Lymphedema, not elsewhere classified: Secondary | ICD-10-CM

## 2023-02-18 DIAGNOSIS — R911 Solitary pulmonary nodule: Secondary | ICD-10-CM | POA: Diagnosis not present

## 2023-02-18 DIAGNOSIS — R059 Cough, unspecified: Secondary | ICD-10-CM | POA: Diagnosis not present

## 2023-02-18 DIAGNOSIS — J40 Bronchitis, not specified as acute or chronic: Secondary | ICD-10-CM

## 2023-02-18 DIAGNOSIS — I10 Essential (primary) hypertension: Secondary | ICD-10-CM

## 2023-02-18 DIAGNOSIS — E785 Hyperlipidemia, unspecified: Secondary | ICD-10-CM | POA: Diagnosis not present

## 2023-02-18 DIAGNOSIS — R0602 Shortness of breath: Secondary | ICD-10-CM | POA: Diagnosis not present

## 2023-02-18 DIAGNOSIS — E039 Hypothyroidism, unspecified: Secondary | ICD-10-CM

## 2023-02-18 DIAGNOSIS — R051 Acute cough: Secondary | ICD-10-CM | POA: Diagnosis not present

## 2023-02-18 DIAGNOSIS — R918 Other nonspecific abnormal finding of lung field: Secondary | ICD-10-CM | POA: Diagnosis not present

## 2023-02-18 LAB — POC COVID19 BINAXNOW: SARS Coronavirus 2 Ag: NEGATIVE

## 2023-02-18 MED ORDER — DOXYCYCLINE HYCLATE 100 MG PO TABS: 100.0000 mg | ORAL_TABLET | Freq: Two times a day (BID) | ORAL | 0 refills | Status: DC

## 2023-02-18 MED ORDER — ALBUTEROL SULFATE HFA 108 (90 BASE) MCG/ACT IN AERS: 2.0000 | INHALATION_SPRAY | Freq: Four times a day (QID) | RESPIRATORY_TRACT | 2 refills | Status: DC | PRN

## 2023-02-18 NOTE — Progress Notes (Signed)
Established Patient Office Visit  Subjective   Patient ID: Joan [name], female    DOB: 1945/08/01  Age: 77 y.o. MRN: [id number]  Chief Complaint  Patient presents with   Cough    X6 days, pt states productive cough, and face feels full, fatigue, loss of appetite, SOB. No COVID or fever    HPI Discussed the use of AI scribe software for clinical note transcription with the patient, who gave verbal consent to proceed.  History of Present Illness   The patient, with a history of chronic respiratory disease managed with Trelegy and albuterol, presents with a week-long history of productive cough. The sputum is described as 'green and brownish mucus.' She denies fever but reports fatigue and occasional dyspnea. She also reports facial pressure and uses Flonase as needed for nasal congestion.  The patient also reports recent issues with nerves, describing episodes of feeling like she wants to scream while at rest. She denies overuse of albuterol as a potential cause.  Additionally, the patient has been awaiting treatment for lymphedema for nearly two years and has an upcoming appointment at the end of August. She was informed that a referral sent in by the doctor had expired and a new one was needed for treatment.      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   History of arthroplasty of knee 09/24/2017   Insomnia 12/13/2016   Inflamed seborrheic keratosis  12/11/2016   Actinic dermatitis 12/11/2016   Melanocytic nevi, unspecified 12/26/2015   Ephelides 12/26/2015   Preventative health care 09/08/2014   Ovarian cyst 08/13/2014   Preoperative cardiovascular examination 08/13/2014   Abnormal EKG 08/13/2014   PVC's (premature ventricular contractions)    Actinic keratosis 05/04/2014   Fracture of left distal radius 12/30/2013   Spinal stenosis of lumbar region 06/24/2013   Atherosclerosis of aorta (HCC) 05/06/2013   Osteopenia 03/06/2013   Cellulitis of lower extremity 11/24/2012   Edema 11/05/2012   Myalgia 04/11/2011   Night sweats 12/27/2010   Contact lens/glasses fitting 12/27/2010   Menopause 12/27/2010   Family history of breast cancer 12/27/2010   ADJUSTMENT DISORDER WITH ANXIOUS MOOD 05/26/2010   PULMONARY NODULE, LEFT UPPER LOBE 12/15/2009   CONSTIPATION, DRUG INDUCED 08/19/2009   KNEE PAIN, LEFT 09/13/2008   THYROID NODULE, LEFT 05/26/2008   GERD 05/26/2008   INSOMNIA 05/06/2008   Anemia 10/24/2007   Hypothyroidism 08/12/2007   Hyperlipidemia 08/12/2007   TOBACCO ABUSE 08/12/2007   Primary hypertension 08/12/2007   OA (osteoarthritis) of knee 08/12/2007   Past Medical History:  Diagnosis Date   Allergy ?   Arthritis    "shoulders; wrist; probably spine" (06/24/2013)   Blood transfusion without reported diagnosis    Cancer (HCC)    skin cancer right leg x 4     Cataract    removed both eyes    Chronic low back pain    followed by Dr Vear Clock pain mgt   Colon polyp    Constipation  COPD (chronic obstructive pulmonary disease) (HCC)    mild   Esophageal stricture    Finger pain, left    2 fingers on left hand since wrist surgery   GERD (gastroesophageal reflux disease)    Glaucoma    Heart murmur    "slight; not on RX" (06/24/2013)   History of cardiac arrhythmia    cardiologist- Traci Turner   Hx of colonic polyps 08/28/2004   Hyperlipemia    Hypertension    Hypothyroidism    Osteoarthritis of left  knee    advanced   Osteoporosis ?   PONV (postoperative nausea and vomiting)    Pt reports symptoms are the result of gallbladder and cholecystectomy, not anesthesia   PVC's (premature ventricular contractions)    Sleep apnea 1990's   "tested; tried mask; couldn't stand it; told me as long as I slept on my side I'd be ok" (06/24/2013)   Thyroid nodule    Tobacco abuse    Past Surgical History:  Procedure Laterality Date   ABDOMINAL HYSTERECTOMY  1988   "partial" (06/24/2013)   ABDOMINAL HYSTERECTOMY     partial in 1988   ANKLE SURGERY Left 1995   "tendon repair" (06/24/2013)   BACK SURGERY     "think today was my 8th back OR" (06/24/2013)   CERVICAL SPINE SURGERY  2012   CESAREAN SECTION  1972   CHOLECYSTECTOMY N/A 04/16/2013   Procedure: LAPAROSCOPIC CHOLECYSTECTOMY ;  Surgeon: Wilmon Arms. Corliss Skains, MD;  Location: [facility];  Service: General;  Laterality: N/A;   COLONOSCOPY     ELBOW SURGERY  1990's   EYE SURGERY  ?   FOOT SURGERY Right 2012   SPUR REMOVED   FRACTURE SURGERY  ?   INFUSION PUMP IMPLANTATION  1990's   "implantablet morphine pump; took it out w/in 11 months   JOINT REPLACEMENT  ?   KNEE ARTHROSCOPY Left 1991; ~ 1993   LAPAROSCOPIC LYSIS OF ADHESIONS N/A 04/16/2013   Procedure: LAPAROSCOPIC LYSIS OF ADHESIONS;  Surgeon: Wilmon Arms. Corliss Skains, MD;  Location: [facility];  Service: General;  Laterality: N/A;   LUMBAR FUSION     and rods   LUMBAR LAMINECTOMY/DECOMPRESSION MICRODISCECTOMY N/A 11/28/2012   Procedure: DECOMPRESSIVE LUMBAR LAMINECTOMY LEVEL 1;  Surgeon: Mariam Dollar, MD;  Location: [facility];  Service: Neurosurgery;  Laterality: N/A;  DECOMPRESSIVE LUMBAR LAMINECTOMY LEVEL 1   ORIF DISTAL RADIUS FRACTURE Left 12/30/2013   dr Melvyn Novas   ORIF WRIST FRACTURE Left 12/30/2013   Procedure: OPEN REDUCTION INTERNAL FIXATION (ORIF) LEFT WRIST FRACTURE AND REPAIR AS INDICATED;  Surgeon: Sharma Covert, MD;  Location: [facility];  Service: Orthopedics;  Laterality: Left;    POLYPECTOMY     POSTERIOR FUSION LUMBAR SPINE  06/24/2013   ROBOTIC ASSISTED SALPINGO OOPHERECTOMY Bilateral 08/20/2014   Procedure: ROBOTIC ASSISTED SALPINGO OOPHORECTOMY;  Surgeon: Loney Laurence, MD;  Location: [facility];  Service: Gynecology;  Laterality: Bilateral;   SHOULDER ARTHROSCOPY Left 09/2011   SPINE SURGERY  8 times   THYROIDECTOMY  2012   TOTAL HIP ARTHROPLASTY Left 04/27/2019   Procedure: TOTAL HIP ARTHROPLASTY ANTERIOR APPROACH;  Surgeon: Ollen Gross, MD;  Location: [facility];  Service: Orthopedics;  Laterality: Left;    TOTAL HIP ARTHROPLASTY Right 09/09/2019   Procedure: TOTAL HIP ARTHROPLASTY ANTERIOR APPROACH;  Surgeon: Ollen Gross, MD;  Location: [facility];  Service: Orthopedics;  Laterality: Right;    TOTAL KNEE ARTHROPLASTY Right 09/09/2017   Procedure: RIGHT TOTAL KNEE ARTHROPLASTY;  Surgeon:  Ollen Gross, MD;  Location: [facility];  Service: Orthopedics;  Laterality: Right;   TUBAL LIGATION  ?   UPPER GASTROINTESTINAL ENDOSCOPY     VITRECTOMY Right 10/18/2020   Social History   Tobacco Use   Smoking status: Every Day    Current packs/day: 1.50    Average packs/day: 1.5 packs/day for 50.0 years (75.0 ttl pk-yrs)    Types: Cigarettes   Smokeless tobacco: Never   Tobacco comments:    10 cigarettes smoked daily ARJ 11/07/21  Vaping Use   Vaping status: Never Used  Substance Use Topics   Alcohol use: No    Alcohol/week: 0.0 standard drinks of alcohol   Drug use: No   Social History   Socioeconomic History   Marital status: Divorced    Spouse name: Not on file   Number of children: Not on file   Years of education: Not on file   Highest education level: 12th grade  Occupational History   Not on file  Tobacco Use   Smoking status: Every Day    Current packs/day: 1.50    Average packs/day: 1.5 packs/day for 50.0 years (75.0 ttl pk-yrs)    Types: Cigarettes   Smokeless tobacco: Never   Tobacco comments:    10 cigarettes smoked daily ARJ  11/07/21  Vaping Use   Vaping status: Never Used  Substance and Sexual Activity   Alcohol use: No    Alcohol/week: 0.0 standard drinks of alcohol   Drug use: No   Sexual activity: Not Currently  Other Topics Concern   Not on file  Social History Narrative   Divorced   Current Smoker  1 ppd -  20 yrs      Alcohol use-no       [employer] - laid off       Physician roster:   Dr. Loraine Leriche Philips - pain management   Dr. Henderson Cloud - GYN   Dr. Wynetta Emery - Neurosurgery   Dr. Yetta Barre - dermatology   Dr. Melvyn Novas - orthopedics   Dr. Armanda Magic - cardiology   Dr. Miller-ophthalmology   Social Determinants of Health   Financial Resource Strain: Low Risk  (11/18/2022)   Overall Financial Resource Strain (CARDIA)    Difficulty of Paying Living Expenses: Not very hard  Food Insecurity: No Food Insecurity (11/18/2022)   Hunger Vital Sign    Worried About Running Out of Food in the Last Year: Never true    Ran Out of Food in the Last Year: Never true  Transportation Needs: No Transportation Needs (11/18/2022)   PRAPARE - Administrator, Civil Service (Medical): No    Lack of Transportation (Non-Medical): No  Physical Activity: Insufficiently Active (11/18/2022)   Exercise Vital Sign    Days of Exercise per Week: 2 days    Minutes of Exercise per Session: 10 min  Stress: No Stress Concern Present (11/18/2022)   [employer] of Occupational Health - Occupational Stress Questionnaire    Feeling of Stress : Not at all  Social Connections: Unknown (11/18/2022)   Social Connection and Isolation Panel [NHANES]    Frequency of Communication with Friends and Family: Twice a week    Frequency of Social Gatherings with Friends and Family: Once a week    Attends Religious Services: Patient declined    Active Member of Clubs or Organizations: No    Attends Banker Meetings: Never    Marital Status: Divorced  Catering manager Violence: Not At Risk (11/16/2021)  Humiliation, Afraid, Rape, and Kick questionnaire    Fear of Current or Ex-Partner: No    Emotionally Abused: No    Physically Abused: No    Sexually Abused: No   Family Status  Relation Name Status   Father  Deceased at age 27   Mother  Deceased at age 32   Sister  Deceased       Died 10-07-15   Brother  Alive   Sister  Alive   Brother  Chemical engineer  (Not Specified)   Neg Hx  (Not Specified)  No partnership data on file   Family History  Problem Relation Age of Onset   Pancreatic cancer Father    Cancer Father    Diabetes type II Mother    Hypertension Mother    Coronary artery disease Mother 29   Diabetes Mother    Thyroid cancer Mother    Skin cancer Mother    Diabetes Sister    Hypertension Sister    Bone cancer Sister    Breast cancer Sister    Hypertension Brother    Breast cancer Maternal Aunt    Other Neg Hx        No family history of  colon cancer   Colon cancer Neg Hx    Colon polyps Neg Hx    Esophageal cancer Neg Hx    Stomach cancer Neg Hx    Rectal cancer Neg Hx    Allergies  Allergen Reactions   Morphine And Codeine Swelling   5-Alpha Reductase Inhibitors    Amitriptyline Swelling    Leg swelling   Augmentin [Amoxicillin-Pot Clavulanate] Nausea And Vomiting   Gabapentin Itching   Triamterene Itching and Rash      Review of Systems  Constitutional:  Negative for chills, fever and malaise/fatigue.  HENT:  Negative for congestion and hearing loss.   Eyes:  Negative for blurred vision and discharge.  Respiratory:  Negative for cough, sputum production and shortness of breath.   Cardiovascular:  Negative for chest pain, palpitations and leg swelling.  Gastrointestinal:  Negative for abdominal pain, blood in stool, constipation, diarrhea, heartburn, nausea and vomiting.  Genitourinary:  Negative for dysuria, frequency, hematuria and urgency.  Musculoskeletal:  Negative for back pain, falls and myalgias.  Skin:  Negative for rash.   Neurological:  Negative for dizziness, sensory change, loss of consciousness, weakness and headaches.  Endo/Heme/Allergies:  Negative for environmental allergies. Does not bruise/bleed easily.  Psychiatric/Behavioral:  Negative for depression and suicidal ideas. The patient is not nervous/anxious and does not have insomnia.       Objective:     BP 110/70 (BP Location: Left Arm, Patient Position: Sitting, Cuff Size: Large)   Pulse 80   Temp 97.6 F (36.4 C) (Oral)   Resp 18   Ht 5' (1.524 m)   Wt 174 lb 6.4 oz (79.1 kg)   SpO2 97%   BMI 34.06 kg/m  BP Readings from Last 3 Encounters:  02/18/23 110/70  12/20/22 120/64  11/13/22 114/78   Wt Readings from Last 3 Encounters:  02/18/23 174 lb 6.4 oz (79.1 kg)  12/20/22 176 lb 3.2 oz (79.9 kg)  11/13/22 176 lb 12.8 oz (80.2 kg)   SpO2 Readings from Last 3 Encounters:  02/18/23 97%  12/20/22 96%  11/13/22 97%      Physical Exam Vitals and nursing note reviewed.  Constitutional:      General: She is not in acute distress.  Appearance: Normal appearance. She is well-developed.  HENT:     Head: Normocephalic and atraumatic.     Right Ear: Tympanic membrane, ear canal and external ear normal. There is no impacted cerumen.     Left Ear: Tympanic membrane, ear canal and external ear normal. There is no impacted cerumen.     Nose: Nose normal.     Mouth/Throat:     Mouth: Mucous membranes are moist.     Pharynx: Oropharynx is clear. No oropharyngeal exudate or posterior oropharyngeal erythema.  Eyes:     General: No scleral icterus.       Right eye: No discharge.        Left eye: No discharge.     Conjunctiva/sclera: Conjunctivae normal.     Pupils: Pupils are equal, round, and reactive to light.  Neck:     Thyroid: No thyromegaly or thyroid tenderness.     Vascular: No JVD.  Cardiovascular:     Rate and Rhythm: Normal rate and regular rhythm.     Heart sounds: Normal heart sounds. No murmur heard. Pulmonary:      Effort: No respiratory distress.     Breath sounds: Decreased breath sounds present.  Abdominal:     General: Bowel sounds are normal. There is no distension.     Palpations: Abdomen is soft. There is no mass.     Tenderness: There is no abdominal tenderness. There is no guarding or rebound.  Genitourinary:    Vagina: Normal.  Musculoskeletal:        General: Normal range of motion.     Cervical back: Normal range of motion and neck supple.     Right lower leg: No edema.     Left lower leg: No edema.  Lymphadenopathy:     Cervical: No cervical adenopathy.  Skin:    General: Skin is warm and dry.     Findings: No erythema or rash.  Neurological:     Mental Status: She is alert and oriented to person, place, and time.     Cranial Nerves: No cranial nerve deficit.     Deep Tendon Reflexes: Reflexes are normal and symmetric.  Psychiatric:        Mood and Affect: Mood normal.        Behavior: Behavior normal.        Thought Content: Thought content normal.        Judgment: Judgment normal.      Results for orders placed or performed in visit on 02/18/23  POC COVID-19  Result Value Ref Range   SARS Coronavirus 2 Ag Negative Negative    Last CBC Lab Results  Component Value Date   WBC 7.3 06/18/2022   HGB 13.6 06/18/2022   HCT 40.1 06/18/2022   MCV 88.8 06/18/2022   MCH 30.0 10/29/2019   RDW 14.2 06/18/2022   PLT 230.0 06/18/2022   Last metabolic panel Lab Results  Component Value Date   GLUCOSE 128 (H) 07/19/2022   NA 138 07/19/2022   K 4.1 07/19/2022   CL 100 07/19/2022   CO2 30 07/19/2022   BUN 14 07/19/2022   CREATININE 0.81 07/19/2022   GFR 69.89 07/19/2022   CALCIUM 8.3 (L) 07/19/2022   PROT 6.3 07/19/2022   ALBUMIN 4.1 07/19/2022   BILITOT 0.4 07/19/2022   ALKPHOS 101 07/19/2022   AST 14 07/19/2022   ALT 12 07/19/2022   ANIONGAP 6 09/11/2019   Last lipids Lab Results  Component Value Date   CHOL  197 06/18/2022   HDL 64.50 06/18/2022   LDLCALC  93 06/18/2022   LDLDIRECT 98.0 10/17/2021   TRIG 197.0 (H) 06/18/2022   CHOLHDL 3 06/18/2022   Last hemoglobin A1c Lab Results  Component Value Date   HGBA1C 6.5 11/10/2020   Last thyroid functions Lab Results  Component Value Date   TSH 1.38 06/18/2022   T4TOTAL 13.2 (H) 08/13/2019   Last vitamin D No results found for: "25OHVITD2", "25OHVITD3", "VD25OH" Last vitamin B12 and Folate No results found for: "VITAMINB12", "FOLATE"    The 10-year ASCVD risk score (Arnett DK, et al., 2019) is: 29.2%    Assessment & Plan:   Problem List Items Addressed This Visit       Unprioritized   Hypothyroidism   Relevant Orders   TSH   Primary hypertension    Well controlled, no changes to meds. Encouraged heart healthy diet such as the DASH diet and exercise as tolerated.        Relevant Orders   CBC with Differential/Platelet   Comprehensive metabolic panel   Lipid panel   Hemoglobin A1c   Hyperlipidemia    Encourage heart healthy diet such as MIND or DASH diet, increase exercise, avoid trans fats, simple carbohydrates and processed foods, consider a krill or fish or flaxseed oil cap daily.        Relevant Orders   CBC with Differential/Platelet   Comprehensive metabolic panel   Lipid panel   Hemoglobin A1c   Other Visit Diagnoses     Acute cough    -  Primary   Relevant Medications   doxycycline (VIBRA-TABS) 100 MG tablet   Other Relevant Orders   POC COVID-19 (Completed)   DG Chest 2 View (Completed)   Pulmonary emphysema, unspecified emphysema type (HCC)       Relevant Medications   albuterol (VENTOLIN HFA) 108 (90 Base) MCG/ACT inhaler   Other Relevant Orders   CBC with Differential/Platelet   Comprehensive metabolic panel   Lipid panel   TSH   Hemoglobin A1c   Lymphedema       Relevant Orders   Ambulatory referral to Physical Therapy   Bronchitis       Relevant Medications   doxycycline (VIBRA-TABS) 100 MG tablet     Assessment and Plan     Respiratory Infection One week of productive cough with green/brown sputum, fatigue, and dyspnea. No fever. Dry throat and facial pressure. Concern for pneumonia given symptoms and use of Trelegy. -Order chest x-ray and blood work. -Prescribe antibiotics. -Advise over-the-counter Mucinex to thin mucus and facilitate expectoration. -Continue Trelegy as prescribed.  Asthma Request for albuterol refill. -Refill albuterol inhaler.  Lymphedema Appointment scheduled for 03/08/2023 at [facility]. Referral may have expired. -Review and renew referral if necessary.  Anxiety Reports feeling like wanting to scream while watching TV. No clear trigger identified. -Monitor symptoms. Consider further evaluation if symptoms persist or worsen.        Return in about 2 weeks (around 03/04/2023), or if symptoms worsen or fail to improve, for f/u bronchitis.    Donato Schultz, DO

## 2023-02-18 NOTE — Patient Instructions (Signed)

## 2023-02-18 NOTE — Assessment & Plan Note (Signed)
Encourage heart healthy diet such as MIND or DASH diet, increase exercise, avoid trans fats, simple carbohydrates and processed foods, consider a krill or fish or flaxseed oil cap daily.  °

## 2023-02-18 NOTE — Assessment & Plan Note (Signed)
Well controlled, no changes to meds. Encouraged heart healthy diet such as the DASH diet and exercise as tolerated.  °

## 2023-02-19 ENCOUNTER — Other Ambulatory Visit: Payer: Self-pay | Admitting: Family Medicine

## 2023-02-19 ENCOUNTER — Telehealth: Payer: Self-pay | Admitting: Family Medicine

## 2023-02-19 NOTE — Telephone Encounter (Signed)
Patient called and mentioned that she was informed she needs to schedule another X-ray, but she's unsure why this needs to be done again. She would like to speak with Dr. Laury Axon for further clarification. Pt did not state who she spoke with regarding Engineer, water. Please advise pt.

## 2023-02-19 NOTE — Telephone Encounter (Signed)
Spoke with patient. Pt states she had a CT in May. Please review and advise if CT should be redone. Pt states if she has to have imaging done again she would like for it to be at [facility]

## 2023-02-19 NOTE — Progress Notes (Unsigned)
   Established Patient Office Visit  Subjective   Patient ID: Joan [name], female    DOB: Oct 22, 1945  Age: 77 y.o. MRN: [id number]  No chief complaint on file.   HPI  {History (Optional):23778}  Review of Systems  Constitutional:  Negative for chills, fever and malaise/fatigue.  HENT:  Negative for congestion and hearing loss.   Eyes:  Negative for blurred vision and discharge.  Respiratory:  Negative for cough, sputum production and shortness of breath.   Cardiovascular:  Negative for chest pain, palpitations and leg swelling.  Gastrointestinal:  Negative for abdominal pain, blood in stool, constipation, diarrhea, heartburn, nausea and vomiting.  Genitourinary:  Negative for dysuria, frequency, hematuria and urgency.  Musculoskeletal:  Negative for back pain, falls and myalgias.  Skin:  Negative for rash.  Neurological:  Negative for dizziness, sensory change, loss of consciousness, weakness and headaches.  Endo/Heme/Allergies:  Negative for environmental allergies. Does not bruise/bleed easily.  Psychiatric/Behavioral:  Negative for depression and suicidal ideas. The patient is not nervous/anxious and does not have insomnia.       Objective:     There were no vitals taken for this visit. {Vitals History (Optional):23777}  Physical Exam Vitals and nursing note reviewed.  Constitutional:      General: She is not in acute distress.    Appearance: Normal appearance. She is well-developed.  HENT:     Head: Normocephalic and atraumatic.  Eyes:     General: No scleral icterus.       Right eye: No discharge.        Left eye: No discharge.  Cardiovascular:     Rate and Rhythm: Normal rate and regular rhythm.     Heart sounds: No murmur heard. Pulmonary:     Effort: Pulmonary effort is normal. No respiratory distress.     Breath sounds: Normal breath sounds.  Musculoskeletal:        General: Normal range of motion.     Cervical back: Normal range of motion and neck  supple.     Right lower leg: No edema.     Left lower leg: No edema.  Skin:    General: Skin is warm and dry.  Neurological:     General: No focal deficit present.     Mental Status: She is alert and oriented to person, place, and time.  Psychiatric:        Mood and Affect: Mood normal.        Behavior: Behavior normal.        Thought Content: Thought content normal.        Judgment: Judgment normal.      No results found for any visits on 02/19/23.  {Labs (Optional):23779}  The 10-year ASCVD risk score (Arnett DK, et al., 2019) is: 28.4%    Assessment & Plan:   Problem List Items Addressed This Visit   None   No follow-ups on file.    Donato Schultz, DO

## 2023-02-20 ENCOUNTER — Telehealth: Payer: Self-pay | Admitting: Family Medicine

## 2023-02-20 ENCOUNTER — Other Ambulatory Visit: Payer: Self-pay | Admitting: Family Medicine

## 2023-02-20 DIAGNOSIS — E1165 Type 2 diabetes mellitus with hyperglycemia: Secondary | ICD-10-CM

## 2023-02-20 NOTE — Telephone Encounter (Signed)
Order has been changed to [facility]. Please advise on change of medication

## 2023-02-20 NOTE — Addendum Note (Signed)
Addended by: Roxanne Gates on: 02/20/2023 02:31 PM   Modules accepted: Orders

## 2023-02-20 NOTE — Telephone Encounter (Signed)
Order has been changed to [facility]

## 2023-02-20 NOTE — Telephone Encounter (Signed)
Pt called to advise that Dr. Laury Axon told her a CT would be ordered but she never heard back about it. Advised CT order placed but patient wants it to be done at [facility] since she does not feel good and does not want to come here. Please update order. Pt also states that the doxcycline causes diarrhea/vomiting and she wants to know if something else can be sent to replace this. She also thought another antibiotic would be called in to go along with the doxycycline. Please call her to advise

## 2023-02-21 ENCOUNTER — Other Ambulatory Visit: Payer: Self-pay | Admitting: Family Medicine

## 2023-02-21 ENCOUNTER — Other Ambulatory Visit (HOSPITAL_COMMUNITY): Payer: Self-pay

## 2023-02-21 DIAGNOSIS — G894 Chronic pain syndrome: Secondary | ICD-10-CM | POA: Diagnosis not present

## 2023-02-21 DIAGNOSIS — M62830 Muscle spasm of back: Secondary | ICD-10-CM | POA: Diagnosis not present

## 2023-02-21 DIAGNOSIS — Z79891 Long term (current) use of opiate analgesic: Secondary | ICD-10-CM | POA: Diagnosis not present

## 2023-02-21 DIAGNOSIS — M961 Postlaminectomy syndrome, not elsewhere classified: Secondary | ICD-10-CM | POA: Diagnosis not present

## 2023-02-21 MED ORDER — LEVOFLOXACIN 750 MG PO TABS: 750.0000 mg | ORAL_TABLET | Freq: Every day | ORAL | 0 refills | Status: AC

## 2023-02-21 MED ORDER — OXYCODONE-ACETAMINOPHEN 10-325 MG PO TABS: 1.0000 | ORAL_TABLET | ORAL | 0 refills | Status: DC | PRN

## 2023-02-21 MED ORDER — OXYCODONE-ACETAMINOPHEN 10-325 MG PO TABS
1.0000 | ORAL_TABLET | ORAL | 0 refills | Status: DC | PRN
  Filled 2023-02-21: qty 160, 27d supply, fill #0

## 2023-02-21 NOTE — Telephone Encounter (Signed)
Pt is requesting this rx as soon as p[possible.

## 2023-02-21 NOTE — Telephone Encounter (Signed)
Rx sent, patient notified.  

## 2023-02-21 NOTE — Addendum Note (Signed)
Addended by: Wilford Corner on: 02/21/2023 12:34 PM   Modules accepted: Orders

## 2023-02-22 ENCOUNTER — Other Ambulatory Visit: Payer: Self-pay

## 2023-02-22 MED ORDER — METFORMIN HCL 500 MG PO TABS: 500.0000 mg | ORAL_TABLET | Freq: Two times a day (BID) | ORAL | 2 refills | Status: DC

## 2023-02-22 MED ORDER — SIMVASTATIN 40 MG PO TABS: 40.0000 mg | ORAL_TABLET | Freq: Every day | ORAL | 1 refills | Status: DC

## 2023-02-25 ENCOUNTER — Ambulatory Visit (HOSPITAL_COMMUNITY)
Admission: RE | Admit: 2023-02-25 | Discharge: 2023-02-25 | Disposition: A | Discharge status: Home / Self Care | Payer: Medicare Other | Source: Ambulatory Visit | Attending: Family Medicine | Admitting: Family Medicine

## 2023-02-25 ENCOUNTER — Telehealth: Payer: Self-pay

## 2023-02-25 DIAGNOSIS — R911 Solitary pulmonary nodule: Secondary | ICD-10-CM | POA: Diagnosis not present

## 2023-02-25 DIAGNOSIS — J479 Bronchiectasis, uncomplicated: Secondary | ICD-10-CM | POA: Diagnosis not present

## 2023-02-25 NOTE — Progress Notes (Signed)
   Care Guide Note  02/25/2023 Name: Joan [name] MRN: [id number] DOB: 01-10-1946  Referred by: Donato Schultz, DO Reason for referral : Care Management (Outreach to schedule with Pharm d )   Joan [name] is a 77 y.o. year old female who is a primary care patient of Donato Schultz, DO. Joan [name] was referred to the pharmacist for assistance related to DM.    Successful contact was made with the patient to discuss pharmacy services including being ready for the pharmacist to call at least 5 minutes before the scheduled appointment time, to have medication bottles and any blood sugar or blood pressure readings ready for review. The patient agreed to meet with the pharmacist via with the pharmacist via telephone visit on (date/time).  02/26/2023  Penne Lash, RMA Care Guide [facility]  [place], [place] [address] Direct Dial: [phone] [name]

## 2023-02-26 ENCOUNTER — Other Ambulatory Visit: Payer: Medicare Other | Admitting: Pharmacist

## 2023-02-26 ENCOUNTER — Other Ambulatory Visit: Payer: Self-pay

## 2023-02-26 MED ORDER — LEVOTHYROXINE SODIUM 88 MCG PO TABS: 88.0000 ug | ORAL_TABLET | Freq: Every day | ORAL | 0 refills | Status: DC

## 2023-02-26 NOTE — Progress Notes (Signed)
   02/26/2023  Patient ID: Joan [name], female   DOB: April 14, 1946, 77 y.o.   MRN: [id number]  CC: diabeted education and medication assistance.   I called patient to review medications and discuss diabetes, however patient states she was not feeling well today. She reports that she has felt nauseated from taking antibiotic - levofloxacin. She has also started metformin 500mg  twice a day on 02/20/2023.   Lab Results  Component Value Date   HGBA1C 7.1 (H) 02/18/2023    She has taken last levofloxacin today.  Recommended she hold metformin for the next 3 days, I will follow up on Friday 03/01/2023 and will discuss restarting metformin at 500mg  daily and work up to bid as tolerated.

## 2023-03-01 ENCOUNTER — Encounter: Payer: Self-pay | Admitting: Pharmacist

## 2023-03-01 ENCOUNTER — Other Ambulatory Visit: Payer: Medicare Other | Admitting: Pharmacist

## 2023-03-01 NOTE — Progress Notes (Signed)
03/01/2023 Name: Joan [name] MRN: [id number] DOB: [birth year]  Chief Complaint  Patient presents with   Medication Management   Diabetes    Joan [name] is a 77 y.o. year old female who presented for a telephone visit.   They were referred to the pharmacist by their PCP for assistance in managing diabetes and medication access.    Subjective:  Care Team: Primary Care Provider: Zola Button, Grayling Congress, DO ; Next Scheduled Visit: not currently scheduled Endocrinologist Dr Lonzo Cloud; Next Scheduled Visit: Not currently scheduled Pulmonologist Dr Everardo All: Next visit not currently scheduled but recall set for 04/2023  Cardiologist Dr Scharlene Gloss. Next visit not currently scheduled but recall set for 08/2023  Medication Access/Adherence  Current Pharmacy:  [facility] [address], [place] - [address] [place] [address] [place] [address] Phone: [phone] Fax: [phone]  [facility] Delivery - [place], [place] - [address] [place] [address] Phone: [phone] Fax: [phone]   Patient reports affordability concerns with their medications: Yes  Patient reports access/transportation concerns to their pharmacy: No  Patient reports adherence concerns with their medications:  Yes  has not taken metformin due to nausea   Diabetes:  Current medications:  metformin 500mg  twice a day - patient only took for 3 or 4 days. Stopped due to nausea and heaving. She has been recently treated for pneumonia. She has not been eating much the last 1 to 2 weeks. And has lost about 11lbs. She completed levofloxacin 3 days ago but still experiencing some nausea.   Medications tried in the past: none  Current glucose readings: none  Patient denies hypoglycemic s/sx including no dizziness, shakiness, sweating. Patient denies hyperglycemic symptoms including no polyuria, polydipsia, polyphagia, nocturia, neuropathy, blurred  vision.  Current meal patterns:  She has been trying to limit intake of sugar / sweets.  As mentioned above, she has not really been eating much due to nausea.   Current physical activity: none recently   COPD:  Current medications: Trelegy 1 inhalation daily and albuterol if needed for shortness of breath  Reports 1 exacerbations in the past year  Current medication access support: none but she has qualified for medication assistance program with [employer] in the past   Medication Management:  Patient reports Good adherence to medications She reports she requested RF for levothyroxine from Dr [facility] 3 days ago but she has not gotten notification from [place] that it was sent in.  Patient reports the following barriers to adherence:  Multiple comorbidities Complex medication regimen Fear of medication side effects Medication cost Possible medication side effects.     Objective:  Lab Results  Component Value Date   HGBA1C 7.1 (H) 02/18/2023    Lab Results  Component Value Date   CREATININE 0.81 02/18/2023   BUN 11 02/18/2023   NA 135 02/18/2023   K 4.7 02/18/2023   CL 97 02/18/2023   CO2 30 02/18/2023    Lab Results  Component Value Date   CHOL 170 02/18/2023   HDL 50.60 02/18/2023   LDLCALC 93 06/18/2022   LDLDIRECT 96.0 02/18/2023   TRIG 221.0 (H) 02/18/2023   CHOLHDL 3 02/18/2023    Medications Reviewed Today     Reviewed by Henrene Pastor, RPH-CPP (Pharmacist) on 03/01/23 at 1208  Med List Status: <None>   Medication Order Taking? Sig Documenting Provider Last Dose Status Informant  Acetaminophen (TYLENOL 8 HOUR ARTHRITIS PAIN PO) [id number] Yes  Take by mouth. [provider] Taking Active   albuterol (VENTOLIN HFA) 108 (90 Base) MCG/ACT inhaler [id number] Yes Inhale 2 puffs into the lungs every 6 (six) hours as needed for wheezing or shortness of breath. Donato Schultz, DO Taking Active   bisacodyl (DULCOLAX) 5 MG EC tablet [id number] No  Take 5-10 mg by mouth daily as needed for mild constipation.   Patient not taking: Reported on 03/01/2023   Artist Pais, Doe-Hyun R, DO Not Taking Active Self  carisoprodol (SOMA) 350 MG tablet [id number] Yes Take 175 mg by mouth 3 (three) times daily.  [provider] Taking Active Self  diclofenac Sodium (VOLTAREN) 1 % GEL [id number]  as needed. [provider]  Active   dorzolamide-timolol (COSOPT) 22.3-6.8 MG/ML ophthalmic solution [id number] Yes Place 1 drop into the left eye 2 (two) times daily. [provider] Taking Active Self           Med Note Kai Levins, MELISSA B   Fri Aug 23, 2017  6:01 PM)    estradiol (ESTRACE) 0.5 MG tablet [id number] Yes Take 0.5 mg by mouth at bedtime.  [provider] Taking Active Self  fluticasone (FLONASE) 50 MCG/ACT nasal spray [id number]  Use 2 spray(s) in each nostril once daily Zola Button, Grayling Congress, DO  Active   furosemide (LASIX) 40 MG tablet [id number] Yes Take 2 tablets (80 mg total) by mouth daily. Seabron Spates R, DO Taking Active   ipratropium (ATROVENT) 0.06 % nasal spray [id number]  Place 2 sprays into both nostrils 2 (two) times daily as needed for rhinitis. Luciano Cutter, MD  Active   levothyroxine (SYNTHROID) 88 MCG tablet [id number] Yes Take 1 tablet (88 mcg total) by mouth daily before breakfast. Shamleffer, Konrad Dolores, MD Taking Active   metFORMIN (GLUCOPHAGE) 500 MG tablet [id number] No Take 1 tablet (500 mg total) by mouth 2 (two) times daily with a meal.  Patient not taking: Reported on 03/01/2023   Donato Schultz, DO Not Taking Active   NONFORMULARY OR COMPOUNDED ITEM [id number]  Compression socks  20-30 mm/ hg   Dx low ext edema Zola Button, Grayling Congress, DO  Active   omeprazole (PRILOSEC) 20 MG capsule [id number] Yes Take 2 capsules by mouth once daily Zola Button, Grayling Congress, DO Taking Active   oxyCODONE-acetaminophen (PERCOCET) 10-325 MG tablet [id number] Yes Take 1 tablet by mouth every 4 (four) hours as  needed for pain (fill 01/15/23)  Taking Active   oxyCODONE-acetaminophen (PERCOCET) 10-325 MG tablet [id number]  Take 1 tablet by mouth every 4 (four) hours as needed for pain   Active   oxyCODONE-acetaminophen (PERCOCET) 10-325 MG tablet [id number]  Take 1 tablet by mouth every 4 (four) hours as needed for pain   Active   potassium chloride SA (KLOR-CON M20) 20 MEQ tablet [id number] Yes TAKE 4 TABLETS BY MOUTH DAILY Seabron Spates R, DO Taking Active   Probiotic Product (PROBIOTIC ACIDOPHILUS BEADS PO) [id number] Yes Take 1 tablet by mouth daily. [provider] Taking Active   simvastatin (ZOCOR) 40 MG tablet [id number] Yes Take 1 tablet (40 mg total) by mouth at bedtime. Donato Schultz, DO Taking Active   spironolactone (ALDACTONE) 25 MG tablet [id number] Yes Take 1 tablet (25 mg total) by mouth 2 (two) times daily.  Patient taking differently: Take 25 mg by mouth 2 (two) times daily. One each morning and one in after   Zola Button, Grayling Congress, DO Taking Active   traZODone (DESYREL)  50 MG tablet [id number] Yes TAKE 1 TABLET BY MOUTH AT BEDTIME Seabron Spates R, DO Taking Active   TRELEGY ELLIPTA 100-62.5-25 MCG/ACT AEPB [id number] Yes Inhale 1 puff into the lungs daily. Seabron Spates R, DO Taking Active   triamcinolone cream (KENALOG) 0.1 % [id number] Yes Apply topically. [provider] Taking Active               Assessment/Plan:   Diabetes: Newly diagnosed. A1c was 7.1% - Reviewed goal A1c, goal fasting, and goal 2 hour post prandial glucose - Reviewed dietary modifications including   Increase non-starchy vegetables - carrots, green bean, squash, zucchini, tomatoes, onions, peppers, spinach and other green leafy vegetables, cabbage, lettuce, cucumbers, asparagus, okra (not fried), eggplant Limit sugar and processed foods (cakes, cookies, ice cream, crackers and chips) Increase fresh fruit but limit serving sizes 1/2 cup or about the size of tennis or  baseball Limit red meat to no more than 1-2 times per week (serving size about the size of your palm) Choose whole grains / lean proteins - whole wheat bread, quinoa, whole grain rice (1/2 cup), fish, chicken, [place] Avoid sugar and calorie containing beverages - soda, sweet tea and juice.  Choose water or unsweetened tea instead.  - Recommend to continue to hold metformin. Will check back in 2 weeks and discuss other options.   - Discussed prescription for glucometer to check blood glucose at home. Patient declined at this time.   COPD: not controlled / recent pneumonia.  - Patient is worried about increased risk of pneumonia with Trelegy. She is going to reach out to Dr Everardo All her pulmonologist to see if she would consider alternative. LABA / LAMA like Anoro might be an option.  - Meets financial criteria for Trelegy or Anoro patient assistance program through [employer]. Will collaborate with provider, CPhT, and patient to pursue assistance.  Will have CPhT go ahead and send application for [employer]. Patient will also make copy of her next EOB from [facility] to provided proof that she has spent > $600 out of pocket for 2024 on medications.    Medication Management: - Coordinated with [facility] regarding levothyroxine Rx - it has been filled and is waiting for pick up. Patient aware.   Patient also had questions about CT scan form 08/19 - it doesn't appear that it has been read yet.  She also wanted to know when Dr Zola Button wanted to see her again.  I will consult with  Dr Zola Button.   Follow Up Plan: 2 weeks to check in regarding diet and see if she will consider using glucometer.  Start alternative to metformin once nausea subsides.   Henrene Pastor, PharmD Clinical Pharmacist [facility]

## 2023-03-01 NOTE — Patient Instructions (Signed)
Diet recommendation to help lower blood glucose  Increase non-starchy vegetables - carrots, green bean, squash, zucchini, tomatoes, onions, peppers, spinach and other green leafy vegetables, cabbage, lettuce, cucumbers, asparagus, okra (not fried), eggplant  Limit sugar and processed foods (cakes, cookies, ice cream, crackers and chips) Increase fresh fruit but limit serving sizes 1/2 cup or about the size of tennis or baseball  Limit red meat to no more than 1-2 times per week (serving size about the size of your palm)  Choose whole grains / lean proteins - whole wheat bread, quinoa, whole grain rice (1/2 cup), fish, chicken, [place]  Avoid sugar and calorie containing beverages - soda, sweet tea and juice.  Choose water or unsweetened tea instead.

## 2023-03-04 ENCOUNTER — Encounter (HOSPITAL_BASED_OUTPATIENT_CLINIC_OR_DEPARTMENT_OTHER): Payer: Self-pay | Admitting: Pulmonary Disease

## 2023-03-04 ENCOUNTER — Encounter: Payer: Self-pay | Admitting: Family Medicine

## 2023-03-04 DIAGNOSIS — R9389 Abnormal findings on diagnostic imaging of other specified body structures: Secondary | ICD-10-CM

## 2023-03-06 ENCOUNTER — Telehealth: Payer: Self-pay

## 2023-03-06 NOTE — Telephone Encounter (Signed)
-----   Message from Henrene Pastor sent at 03/01/2023 12:47 PM EDT ----- Please mail out application for [employer] medication assistance program

## 2023-03-06 NOTE — Telephone Encounter (Signed)
Mailed [employer] application to patients home for Trelegy assistance.

## 2023-03-07 ENCOUNTER — Telehealth (HOSPITAL_BASED_OUTPATIENT_CLINIC_OR_DEPARTMENT_OTHER): Payer: Self-pay | Admitting: Pulmonary Disease

## 2023-03-07 NOTE — Telephone Encounter (Signed)
I was able to reach patient. She is feeling very poorly with subjective fevers and chills and shortness of breath. Advised patient to go to ED. She agrees and will be heading there now.

## 2023-03-07 NOTE — Telephone Encounter (Signed)
Called patient regarding scheduling follow up with Dr. Everardo All while on the phone patient is struggling to breathe and worsening also heaving? Hard for patient to respond to me/ talk.   Offered spot this afternoon patient declined. Offered appointment with Dr. Sherene Sires in [place] and patient declined. Offered 9/3 with [place] patient still declined and states "I will be dead by then".   Please advise if patient just needs to go to the ER and call patient back.

## 2023-03-08 ENCOUNTER — Inpatient Hospital Stay (HOSPITAL_BASED_OUTPATIENT_CLINIC_OR_DEPARTMENT_OTHER)
Admission: EM | Admit: 2023-03-08 | Discharge: 2023-03-15 | DRG: 194 | Disposition: A | Discharge status: Home / Self Care | Payer: Medicare Other | Source: Ambulatory Visit | Attending: Internal Medicine | Admitting: Internal Medicine

## 2023-03-08 ENCOUNTER — Encounter: Payer: Self-pay | Admitting: Physician Assistant

## 2023-03-08 ENCOUNTER — Ambulatory Visit (HOSPITAL_COMMUNITY): Payer: Medicare Other | Admitting: Physical Therapy

## 2023-03-08 ENCOUNTER — Telehealth: Payer: Self-pay | Admitting: Family Medicine

## 2023-03-08 ENCOUNTER — Telehealth: Payer: Self-pay | Admitting: Physician Assistant

## 2023-03-08 ENCOUNTER — Emergency Department (HOSPITAL_BASED_OUTPATIENT_CLINIC_OR_DEPARTMENT_OTHER): Payer: Medicare Other

## 2023-03-08 ENCOUNTER — Other Ambulatory Visit: Payer: Self-pay

## 2023-03-08 ENCOUNTER — Encounter (HOSPITAL_BASED_OUTPATIENT_CLINIC_OR_DEPARTMENT_OTHER): Payer: Self-pay

## 2023-03-08 ENCOUNTER — Ambulatory Visit (INDEPENDENT_AMBULATORY_CARE_PROVIDER_SITE_OTHER): Payer: Medicare Other | Admitting: Physician Assistant

## 2023-03-08 VITALS — BP 113/68 | HR 109 | Temp 98.3°F | Ht 60.0 in | Wt 165.4 lb

## 2023-03-08 DIAGNOSIS — M159 Polyosteoarthritis, unspecified: Secondary | ICD-10-CM | POA: Diagnosis not present

## 2023-03-08 DIAGNOSIS — Z8719 Personal history of other diseases of the digestive system: Secondary | ICD-10-CM

## 2023-03-08 DIAGNOSIS — Z9049 Acquired absence of other specified parts of digestive tract: Secondary | ICD-10-CM

## 2023-03-08 DIAGNOSIS — E119 Type 2 diabetes mellitus without complications: Secondary | ICD-10-CM | POA: Diagnosis not present

## 2023-03-08 DIAGNOSIS — Z881 Allergy status to other antibiotic agents status: Secondary | ICD-10-CM

## 2023-03-08 DIAGNOSIS — Z7984 Long term (current) use of oral hypoglycemic drugs: Secondary | ICD-10-CM

## 2023-03-08 DIAGNOSIS — Z96643 Presence of artificial hip joint, bilateral: Secondary | ICD-10-CM | POA: Diagnosis not present

## 2023-03-08 DIAGNOSIS — G8929 Other chronic pain: Secondary | ICD-10-CM | POA: Diagnosis not present

## 2023-03-08 DIAGNOSIS — Z79899 Other long term (current) drug therapy: Secondary | ICD-10-CM | POA: Diagnosis not present

## 2023-03-08 DIAGNOSIS — E039 Hypothyroidism, unspecified: Secondary | ICD-10-CM | POA: Diagnosis present

## 2023-03-08 DIAGNOSIS — Z96651 Presence of right artificial knee joint: Secondary | ICD-10-CM | POA: Diagnosis not present

## 2023-03-08 DIAGNOSIS — M8580 Other specified disorders of bone density and structure, unspecified site: Secondary | ICD-10-CM | POA: Diagnosis present

## 2023-03-08 DIAGNOSIS — I808 Phlebitis and thrombophlebitis of other sites: Secondary | ICD-10-CM | POA: Diagnosis not present

## 2023-03-08 DIAGNOSIS — Z803 Family history of malignant neoplasm of breast: Secondary | ICD-10-CM

## 2023-03-08 DIAGNOSIS — E890 Postprocedural hypothyroidism: Secondary | ICD-10-CM | POA: Diagnosis present

## 2023-03-08 DIAGNOSIS — E876 Hypokalemia: Secondary | ICD-10-CM | POA: Diagnosis not present

## 2023-03-08 DIAGNOSIS — E785 Hyperlipidemia, unspecified: Secondary | ICD-10-CM | POA: Diagnosis not present

## 2023-03-08 DIAGNOSIS — Z85828 Personal history of other malignant neoplasm of skin: Secondary | ICD-10-CM

## 2023-03-08 DIAGNOSIS — Z833 Family history of diabetes mellitus: Secondary | ICD-10-CM

## 2023-03-08 DIAGNOSIS — R918 Other nonspecific abnormal finding of lung field: Secondary | ICD-10-CM | POA: Diagnosis not present

## 2023-03-08 DIAGNOSIS — Z981 Arthrodesis status: Secondary | ICD-10-CM | POA: Diagnosis not present

## 2023-03-08 DIAGNOSIS — F17210 Nicotine dependence, cigarettes, uncomplicated: Secondary | ICD-10-CM | POA: Diagnosis present

## 2023-03-08 DIAGNOSIS — I10 Essential (primary) hypertension: Secondary | ICD-10-CM | POA: Diagnosis not present

## 2023-03-08 DIAGNOSIS — J9811 Atelectasis: Secondary | ICD-10-CM | POA: Diagnosis not present

## 2023-03-08 DIAGNOSIS — J440 Chronic obstructive pulmonary disease with acute lower respiratory infection: Secondary | ICD-10-CM | POA: Diagnosis present

## 2023-03-08 DIAGNOSIS — Z808 Family history of malignant neoplasm of other organs or systems: Secondary | ICD-10-CM

## 2023-03-08 DIAGNOSIS — F17200 Nicotine dependence, unspecified, uncomplicated: Secondary | ICD-10-CM | POA: Diagnosis not present

## 2023-03-08 DIAGNOSIS — Z79890 Hormone replacement therapy (postmenopausal): Secondary | ICD-10-CM | POA: Diagnosis not present

## 2023-03-08 DIAGNOSIS — R600 Localized edema: Secondary | ICD-10-CM | POA: Diagnosis present

## 2023-03-08 DIAGNOSIS — R509 Fever, unspecified: Secondary | ICD-10-CM | POA: Diagnosis not present

## 2023-03-08 DIAGNOSIS — E669 Obesity, unspecified: Secondary | ICD-10-CM | POA: Diagnosis present

## 2023-03-08 DIAGNOSIS — M545 Low back pain, unspecified: Secondary | ICD-10-CM | POA: Diagnosis present

## 2023-03-08 DIAGNOSIS — K219 Gastro-esophageal reflux disease without esophagitis: Secondary | ICD-10-CM | POA: Diagnosis not present

## 2023-03-08 DIAGNOSIS — H409 Unspecified glaucoma: Secondary | ICD-10-CM | POA: Diagnosis present

## 2023-03-08 DIAGNOSIS — Z90710 Acquired absence of both cervix and uterus: Secondary | ICD-10-CM

## 2023-03-08 DIAGNOSIS — E872 Acidosis, unspecified: Secondary | ICD-10-CM | POA: Diagnosis present

## 2023-03-08 DIAGNOSIS — I890 Lymphedema, not elsewhere classified: Secondary | ICD-10-CM | POA: Diagnosis not present

## 2023-03-08 DIAGNOSIS — Z6832 Body mass index (BMI) 32.0-32.9, adult: Secondary | ICD-10-CM

## 2023-03-08 DIAGNOSIS — R000 Tachycardia, unspecified: Secondary | ICD-10-CM | POA: Diagnosis not present

## 2023-03-08 DIAGNOSIS — J439 Emphysema, unspecified: Secondary | ICD-10-CM

## 2023-03-08 DIAGNOSIS — R197 Diarrhea, unspecified: Secondary | ICD-10-CM | POA: Diagnosis present

## 2023-03-08 DIAGNOSIS — J189 Pneumonia, unspecified organism: Principal | ICD-10-CM | POA: Diagnosis present

## 2023-03-08 DIAGNOSIS — E8809 Other disorders of plasma-protein metabolism, not elsewhere classified: Secondary | ICD-10-CM | POA: Diagnosis not present

## 2023-03-08 DIAGNOSIS — R0602 Shortness of breath: Secondary | ICD-10-CM | POA: Diagnosis not present

## 2023-03-08 DIAGNOSIS — Z888 Allergy status to other drugs, medicaments and biological substances status: Secondary | ICD-10-CM

## 2023-03-08 DIAGNOSIS — R531 Weakness: Secondary | ICD-10-CM | POA: Diagnosis not present

## 2023-03-08 DIAGNOSIS — Z8249 Family history of ischemic heart disease and other diseases of the circulatory system: Secondary | ICD-10-CM

## 2023-03-08 DIAGNOSIS — Z1152 Encounter for screening for COVID-19: Secondary | ICD-10-CM

## 2023-03-08 DIAGNOSIS — Z885 Allergy status to narcotic agent status: Secondary | ICD-10-CM

## 2023-03-08 DIAGNOSIS — R0989 Other specified symptoms and signs involving the circulatory and respiratory systems: Secondary | ICD-10-CM | POA: Diagnosis not present

## 2023-03-08 DIAGNOSIS — Z800 Family history of malignant neoplasm of digestive organs: Secondary | ICD-10-CM

## 2023-03-08 LAB — CBC
HCT: 37.7 % (ref 36.0–46.0)
Hemoglobin: 12.6 g/dL (ref 12.0–15.0)
MCH: 29.9 pg (ref 26.0–34.0)
MCHC: 33.4 g/dL (ref 30.0–36.0)
MCV: 89.5 fL (ref 80.0–100.0)
Platelets: 203 10*3/uL (ref 150–400)
RBC: 4.21 MIL/uL (ref 3.87–5.11)
RDW: 13.4 % (ref 11.5–15.5)
WBC: 10.3 10*3/uL (ref 4.0–10.5)
nRBC: 0 % (ref 0.0–0.2)

## 2023-03-08 LAB — DIFFERENTIAL
Abs Immature Granulocytes: 0.14 10*3/uL — ABNORMAL HIGH (ref 0.00–0.07)
Basophils Absolute: 0.1 10*3/uL (ref 0.0–0.1)
Basophils Relative: 1 %
Eosinophils Absolute: 0 10*3/uL (ref 0.0–0.5)
Eosinophils Relative: 0 %
Immature Granulocytes: 1 %
Lymphocytes Relative: 12 %
Lymphs Abs: 1.2 10*3/uL (ref 0.7–4.0)
Monocytes Absolute: 1.6 10*3/uL — ABNORMAL HIGH (ref 0.1–1.0)
Monocytes Relative: 16 %
Neutro Abs: 7.2 10*3/uL (ref 1.7–7.7)
Neutrophils Relative %: 70 %

## 2023-03-08 LAB — BASIC METABOLIC PANEL
Anion gap: 12 (ref 5–15)
BUN: 14 mg/dL (ref 8–23)
CO2: 23 mmol/L (ref 22–32)
Calcium: 8.4 mg/dL — ABNORMAL LOW (ref 8.9–10.3)
Chloride: 100 mmol/L (ref 98–111)
Creatinine, Ser: 0.97 mg/dL (ref 0.44–1.00)
GFR, Estimated: 60 mL/min — ABNORMAL LOW (ref >60)
Glucose, Bld: 157 mg/dL — ABNORMAL HIGH (ref 70–99)
Potassium: 3.8 mmol/L (ref 3.5–5.1)
Sodium: 135 mmol/L (ref 135–145)

## 2023-03-08 LAB — URINALYSIS, ROUTINE W REFLEX MICROSCOPIC
Glucose, UA: NEGATIVE mg/dL
Hgb urine dipstick: NEGATIVE
Ketones, ur: 15 mg/dL — AB
Leukocytes,Ua: NEGATIVE
Nitrite: NEGATIVE
Protein, ur: 30 mg/dL — AB
Specific Gravity, Urine: 1.025 (ref 1.005–1.030)
pH: 5 (ref 5.0–8.0)

## 2023-03-08 LAB — HEPATIC FUNCTION PANEL
ALT: 13 U/L (ref 0–44)
AST: 23 U/L (ref 15–41)
Albumin: 3.4 g/dL — ABNORMAL LOW (ref 3.5–5.0)
Alkaline Phosphatase: 97 U/L (ref 38–126)
Bilirubin, Direct: 0.1 mg/dL (ref 0.0–0.2)
Indirect Bilirubin: 0.3 mg/dL (ref 0.3–0.9)
Total Bilirubin: 0.4 mg/dL (ref 0.3–1.2)
Total Protein: 6.8 g/dL (ref 6.5–8.1)

## 2023-03-08 LAB — CBG MONITORING, ED: Glucose-Capillary: 159 mg/dL — ABNORMAL HIGH (ref 70–99)

## 2023-03-08 LAB — URINALYSIS, MICROSCOPIC (REFLEX)

## 2023-03-08 LAB — MRSA NEXT GEN BY PCR, NASAL: MRSA by PCR Next Gen: NOT DETECTED

## 2023-03-08 LAB — SARS CORONAVIRUS 2 BY RT PCR: SARS Coronavirus 2 by RT PCR: NEGATIVE

## 2023-03-08 LAB — GLUCOSE, CAPILLARY: Glucose-Capillary: 130 mg/dL — ABNORMAL HIGH (ref 70–99)

## 2023-03-08 LAB — LACTIC ACID, PLASMA: Lactic Acid, Venous: 1.9 mmol/L (ref 0.5–1.9)

## 2023-03-08 MED ORDER — PANTOPRAZOLE SODIUM 20 MG PO TBEC
20.0000 mg | DELAYED_RELEASE_TABLET | Freq: Every day | ORAL | Status: DC
  Filled 2023-03-08 (×2): qty 1

## 2023-03-08 MED ORDER — SODIUM CHLORIDE 0.9 % IV SOLN
500.0000 mg | INTRAVENOUS | Status: AC
  Administered 2023-03-08 – 2023-03-11 (×3): 500 mg via INTRAVENOUS
  Filled 2023-03-08 (×3): qty 5

## 2023-03-08 MED ORDER — ACETAMINOPHEN 325 MG PO TABS: 650.0000 mg | ORAL_TABLET | Freq: Four times a day (QID) | ORAL | Status: DC | PRN

## 2023-03-08 MED ORDER — CARISOPRODOL 350 MG PO TABS
175.0000 mg | ORAL_TABLET | Freq: Three times a day (TID) | ORAL | Status: DC
  Administered 2023-03-09 – 2023-03-15 (×18): 175 mg via ORAL
  Filled 2023-03-08 (×20): qty 1

## 2023-03-08 MED ORDER — VANCOMYCIN HCL IN DEXTROSE 1-5 GM/200ML-% IV SOLN
1000.0000 mg | Freq: Once | INTRAVENOUS | Status: DC
  Administered 2023-03-08: 1000 mg via INTRAVENOUS
  Filled 2023-03-08: qty 200

## 2023-03-08 MED ORDER — IOHEXOL 350 MG/ML SOLN
75.0000 mL | Freq: Once | INTRAVENOUS | Status: AC | PRN
  Administered 2023-03-08: 75 mL via INTRAVENOUS

## 2023-03-08 MED ORDER — FUROSEMIDE 40 MG PO TABS
80.0000 mg | ORAL_TABLET | Freq: Every day | ORAL | Status: DC
  Filled 2023-03-08: qty 2

## 2023-03-08 MED ORDER — SODIUM CHLORIDE 0.9% FLUSH
3.0000 mL | Freq: Two times a day (BID) | INTRAVENOUS | Status: DC
  Administered 2023-03-08 – 2023-03-14 (×10): 3 mL via INTRAVENOUS

## 2023-03-08 MED ORDER — ACETAMINOPHEN 650 MG RE SUPP: 650.0000 mg | Freq: Four times a day (QID) | RECTAL | Status: DC | PRN

## 2023-03-08 MED ORDER — LEVOTHYROXINE SODIUM 88 MCG PO TABS
88.0000 ug | ORAL_TABLET | Freq: Every day | ORAL | Status: DC
  Administered 2023-03-09 – 2023-03-15 (×7): 88 ug via ORAL
  Filled 2023-03-08 (×7): qty 1

## 2023-03-08 MED ORDER — POLYETHYLENE GLYCOL 3350 17 G PO PACK: 17.0000 g | PACK | Freq: Every day | ORAL | Status: DC | PRN

## 2023-03-08 MED ORDER — DORZOLAMIDE HCL-TIMOLOL MAL 2-0.5 % OP SOLN
1.0000 [drp] | Freq: Two times a day (BID) | OPHTHALMIC | Status: DC
  Administered 2023-03-09 – 2023-03-15 (×13): 1 [drp] via OPHTHALMIC
  Filled 2023-03-08: qty 10

## 2023-03-08 MED ORDER — OXYCODONE HCL 5 MG PO TABS
5.0000 mg | ORAL_TABLET | ORAL | Status: DC | PRN
  Administered 2023-03-09 – 2023-03-15 (×22): 5 mg via ORAL
  Filled 2023-03-08 (×22): qty 1

## 2023-03-08 MED ORDER — ONDANSETRON HCL 4 MG/2ML IJ SOLN
4.0000 mg | Freq: Once | INTRAMUSCULAR | Status: DC
  Filled 2023-03-08 (×2): qty 2

## 2023-03-08 MED ORDER — INSULIN ASPART 100 UNIT/ML IJ SOLN: 0.0000 [IU] | Freq: Every day | INTRAMUSCULAR | Status: DC

## 2023-03-08 MED ORDER — BISACODYL 5 MG PO TBEC
5.0000 mg | DELAYED_RELEASE_TABLET | Freq: Every day | ORAL | Status: DC
  Administered 2023-03-09 – 2023-03-14 (×3): 5 mg via ORAL
  Filled 2023-03-08 (×7): qty 1

## 2023-03-08 MED ORDER — PIPERACILLIN-TAZOBACTAM 3.375 G IVPB
3.3750 g | Freq: Three times a day (TID) | INTRAVENOUS | Status: DC
  Administered 2023-03-09 – 2023-03-15 (×19): 3.375 g via INTRAVENOUS
  Filled 2023-03-08 (×19): qty 50

## 2023-03-08 MED ORDER — SODIUM CHLORIDE 0.9 % IV SOLN: INTRAVENOUS | Status: DC

## 2023-03-08 MED ORDER — PIPERACILLIN-TAZOBACTAM 3.375 G IVPB
3.3750 g | Freq: Once | INTRAVENOUS | Status: DC
  Administered 2023-03-08: 3.375 g via INTRAVENOUS
  Filled 2023-03-08: qty 50

## 2023-03-08 MED ORDER — TRAZODONE HCL 50 MG PO TABS
50.0000 mg | ORAL_TABLET | Freq: Every day | ORAL | Status: DC
  Administered 2023-03-09 – 2023-03-14 (×6): 50 mg via ORAL
  Filled 2023-03-08 (×7): qty 1

## 2023-03-08 MED ORDER — SIMVASTATIN 40 MG PO TABS
40.0000 mg | ORAL_TABLET | Freq: Every day | ORAL | Status: DC
  Administered 2023-03-09 – 2023-03-14 (×6): 40 mg via ORAL
  Filled 2023-03-08 (×7): qty 1

## 2023-03-08 MED ORDER — ALBUTEROL SULFATE HFA 108 (90 BASE) MCG/ACT IN AERS: 2.0000 | INHALATION_SPRAY | Freq: Four times a day (QID) | RESPIRATORY_TRACT | Status: DC | PRN

## 2023-03-08 MED ORDER — CHLORHEXIDINE GLUCONATE CLOTH 2 % EX PADS
6.0000 | MEDICATED_PAD | Freq: Every day | CUTANEOUS | Status: DC
  Administered 2023-03-08 – 2023-03-09 (×2): 6 via TOPICAL

## 2023-03-08 MED ORDER — SPIRONOLACTONE 25 MG PO TABS: 25.0000 mg | ORAL_TABLET | Freq: Two times a day (BID) | ORAL | Status: DC

## 2023-03-08 MED ORDER — ENOXAPARIN SODIUM 40 MG/0.4ML IJ SOSY
40.0000 mg | PREFILLED_SYRINGE | INTRAMUSCULAR | Status: DC
  Administered 2023-03-09 – 2023-03-15 (×7): 40 mg via SUBCUTANEOUS
  Filled 2023-03-08 (×7): qty 0.4

## 2023-03-08 MED ORDER — VANCOMYCIN HCL IN DEXTROSE 1-5 GM/200ML-% IV SOLN: 1000.0000 mg | INTRAVENOUS | Status: DC

## 2023-03-08 MED ORDER — PIPERACILLIN-TAZOBACTAM 3.375 G IVPB: 3.3750 g | Freq: Once | INTRAVENOUS | Status: DC

## 2023-03-08 MED ORDER — POTASSIUM CHLORIDE CRYS ER 20 MEQ PO TBCR
80.0000 meq | EXTENDED_RELEASE_TABLET | Freq: Every day | ORAL | Status: DC
  Filled 2023-03-08: qty 4

## 2023-03-08 MED ORDER — ALBUTEROL SULFATE (2.5 MG/3ML) 0.083% IN NEBU
2.5000 mg | INHALATION_SOLUTION | Freq: Four times a day (QID) | RESPIRATORY_TRACT | Status: DC | PRN
  Filled 2023-03-08: qty 3

## 2023-03-08 MED ORDER — OXYCODONE-ACETAMINOPHEN 5-325 MG PO TABS
1.0000 | ORAL_TABLET | ORAL | Status: DC | PRN
  Administered 2023-03-09 – 2023-03-15 (×24): 1 via ORAL
  Filled 2023-03-08 (×24): qty 1

## 2023-03-08 MED ORDER — INSULIN ASPART 100 UNIT/ML IJ SOLN
0.0000 [IU] | Freq: Three times a day (TID) | INTRAMUSCULAR | Status: DC
  Administered 2023-03-10 – 2023-03-11 (×4): 2 [IU] via SUBCUTANEOUS
  Administered 2023-03-11: 3 [IU] via SUBCUTANEOUS
  Administered 2023-03-11 – 2023-03-12 (×2): 2 [IU] via SUBCUTANEOUS
  Administered 2023-03-12 – 2023-03-13 (×2): 3 [IU] via SUBCUTANEOUS
  Administered 2023-03-13: 2 [IU] via SUBCUTANEOUS
  Administered 2023-03-14: 3 [IU] via SUBCUTANEOUS
  Administered 2023-03-14: 2 [IU] via SUBCUTANEOUS
  Administered 2023-03-15: 3 [IU] via SUBCUTANEOUS

## 2023-03-08 MED ORDER — SODIUM CHLORIDE 0.9 % IV SOLN: INTRAVENOUS | Status: DC | PRN

## 2023-03-08 MED ORDER — OXYCODONE-ACETAMINOPHEN 10-325 MG PO TABS: 1.0000 | ORAL_TABLET | ORAL | Status: DC | PRN

## 2023-03-08 NOTE — Progress Notes (Addendum)
Pharmacy Antibiotic Note  Joan [name] is a 77 y.o. female admitted on 03/08/2023 with pneumonia.  Pharmacy has been consulted for vancomycin and Zosyn dosing.  Plan: Zosyn 3.375g IV q8h (4 hour infusion). Vancomycin 1000 mg IV x1 given at [facility] then vancomycin 1000 mg IV q36h ( AUC 540, Scr 0.97, wt 75, BMI 32)   Height: 5' (152.4 cm) Weight: 75 kg (165 lb 5.5 oz) IBW/kg (Calculated) : 45.5  Temp (24hrs), Avg:98.3 F (36.8 C), Min:98.2 F (36.8 C), Max:98.3 F (36.8 C)  Recent Labs  Lab 03/08/23 1411 03/08/23 1429  WBC 10.3  --   CREATININE 0.97  --   LATICACIDVEN  --  1.9    Estimated Creatinine Clearance: 43.2 mL/min (by C-G formula based on SCr of 0.97 mg/dL).    Allergies  Allergen Reactions   Metformin And Related Nausea And Vomiting   Morphine And Codeine Swelling   5-Alpha Reductase Inhibitors    Amitriptyline Swelling    Leg swelling   Augmentin [Amoxicillin-Pot Clavulanate] Nausea And Vomiting   Gabapentin Itching   Triamterene Itching and Rash        Thank you for allowing pharmacy to be a part of this patient's care.  Adalberto Cole, PharmD, BCPS 03/08/2023 9:01 PM

## 2023-03-08 NOTE — ED Notes (Signed)
Patient transported to CT 

## 2023-03-08 NOTE — Telephone Encounter (Signed)
Pt saw Lillia Abed and then went to the ED

## 2023-03-08 NOTE — ED Triage Notes (Signed)
The patient stated she was dx with pneumonia on 8/12. She stated she is still having a cough and fever and chills at night. She is having increased weakness.

## 2023-03-08 NOTE — ED Notes (Signed)
ED TO INPATIENT HANDOFF REPORT  ED Nurse Name and Phone #: Samad Thon, MSN, RN, PCCN  S Name/Age/Gender Joan [name] 78 y.o. female Room/Bed: MH09/MH09  Code Status   Code Status: Prior  Home/SNF/Other Home Patient oriented to: self, place, time, and situation Is this baseline? Yes   Triage Complete: Triage complete  Chief Complaint Multifocal pneumonia [J18.9]  Triage Note The patient stated she was dx with pneumonia on 8/12. She stated she is still having a cough and fever and chills at night. She is having increased weakness.    Allergies Allergies  Allergen Reactions   Metformin And Related Nausea And Vomiting   Morphine And Codeine Swelling   5-Alpha Reductase Inhibitors    Amitriptyline Swelling    Leg swelling   Augmentin [Amoxicillin-Pot Clavulanate] Nausea And Vomiting   Gabapentin Itching   Triamterene Itching and Rash    Level of Care/Admitting Diagnosis ED Disposition     ED Disposition  Admit   Condition  --   Comment  Hospital Area: [facility] [[id number]]  Level of Care: Stepdown [14]  Admit to SDU based on following criteria: Respiratory Distress:  Frequent assessment and/or intervention to maintain adequate ventilation/respiration, pulmonary toilet, and respiratory treatment.  May admit patient to [facility] or [facility] if equivalent level of care is available:: Yes  Interfacility transfer: Yes  Covid Evaluation: Confirmed COVID Negative  Diagnosis: Multifocal pneumonia [[id number]]  Admitting Physician: Rodolph Bong [[id number]]  Attending Physician: Rodolph Bong [[id number]]  Certification:: I certify this patient will need inpatient services for at least 2 midnights  Expected Medical Readiness: 03/12/2023          B Medical/Surgery History Past Medical History:  Diagnosis Date   Allergy ?   Arthritis    "shoulders; wrist; probably spine" (06/24/2013)   Blood transfusion without reported diagnosis     Cancer (HCC)    skin cancer right leg x 4     Cataract    removed both eyes    Chronic low back pain    followed by Dr Vear Clock pain mgt   Colon polyp    Constipation    COPD (chronic obstructive pulmonary disease) (HCC)    mild   Esophageal stricture    Finger pain, left    2 fingers on left hand since wrist surgery   GERD (gastroesophageal reflux disease)    Glaucoma    Heart murmur    "slight; not on RX" (06/24/2013)   History of cardiac arrhythmia    cardiologist- Traci Turner   Hx of colonic polyps 08/28/2004   Hyperlipemia    Hypertension    Hypothyroidism    Osteoarthritis of left knee    advanced   Osteoporosis ?   PONV (postoperative nausea and vomiting)    Pt reports symptoms are the result of gallbladder and cholecystectomy, not anesthesia   PVC's (premature ventricular contractions)    Sleep apnea 1990's   "tested; tried mask; couldn't stand it; told me as long as I slept on my side I'd be ok" (06/24/2013)   Thyroid nodule    Tobacco abuse    Past Surgical History:  Procedure Laterality Date   ABDOMINAL HYSTERECTOMY  1988   "partial" (06/24/2013)   ABDOMINAL HYSTERECTOMY     partial in 1988   ANKLE SURGERY Left 1995   "tendon repair" (06/24/2013)   BACK SURGERY     "think today was my 8th back OR" (06/24/2013)   CERVICAL SPINE SURGERY  2012   CESAREAN SECTION  1972   CHOLECYSTECTOMY N/A 04/16/2013   Procedure: LAPAROSCOPIC CHOLECYSTECTOMY ;  Surgeon: Wilmon Arms. Corliss Skains, MD;  Location: [facility];  Service: General;  Laterality: N/A;   COLONOSCOPY     ELBOW SURGERY  1990's   EYE SURGERY  ?   FOOT SURGERY Right 2012   SPUR REMOVED   FRACTURE SURGERY  ?   INFUSION PUMP IMPLANTATION  1990's   "implantablet morphine pump; took it out w/in 11 months   JOINT REPLACEMENT  ?   KNEE ARTHROSCOPY Left 1991; ~ 1993   LAPAROSCOPIC LYSIS OF ADHESIONS N/A 04/16/2013   Procedure: LAPAROSCOPIC LYSIS OF ADHESIONS;  Surgeon: Wilmon Arms. Corliss Skains, MD;  Location: [facility];   Service: General;  Laterality: N/A;   LUMBAR FUSION     and rods   LUMBAR LAMINECTOMY/DECOMPRESSION MICRODISCECTOMY N/A 11/28/2012   Procedure: DECOMPRESSIVE LUMBAR LAMINECTOMY LEVEL 1;  Surgeon: Mariam Dollar, MD;  Location: [facility];  Service: Neurosurgery;  Laterality: N/A;  DECOMPRESSIVE LUMBAR LAMINECTOMY LEVEL 1   ORIF DISTAL RADIUS FRACTURE Left 12/30/2013   dr Melvyn Novas   ORIF WRIST FRACTURE Left 12/30/2013   Procedure: OPEN REDUCTION INTERNAL FIXATION (ORIF) LEFT WRIST FRACTURE AND REPAIR AS INDICATED;  Surgeon: Sharma Covert, MD;  Location: [facility];  Service: Orthopedics;  Laterality: Left;   POLYPECTOMY     POSTERIOR FUSION LUMBAR SPINE  06/24/2013   ROBOTIC ASSISTED SALPINGO OOPHERECTOMY Bilateral 08/20/2014   Procedure: ROBOTIC ASSISTED SALPINGO OOPHORECTOMY;  Surgeon: Loney Laurence, MD;  Location: [facility];  Service: Gynecology;  Laterality: Bilateral;   SHOULDER ARTHROSCOPY Left 09/2011   SPINE SURGERY  8 times   THYROIDECTOMY  2012   TOTAL HIP ARTHROPLASTY Left 04/27/2019   Procedure: TOTAL HIP ARTHROPLASTY ANTERIOR APPROACH;  Surgeon: Ollen Gross, MD;  Location: [facility];  Service: Orthopedics;  Laterality: Left;    TOTAL HIP ARTHROPLASTY Right 09/09/2019   Procedure: TOTAL HIP ARTHROPLASTY ANTERIOR APPROACH;  Surgeon: Ollen Gross, MD;  Location: [facility];  Service: Orthopedics;  Laterality: Right;    TOTAL KNEE ARTHROPLASTY Right 09/09/2017   Procedure: RIGHT TOTAL KNEE ARTHROPLASTY;  Surgeon: Ollen Gross, MD;  Location: [facility];  Service: Orthopedics;  Laterality: Right;   TUBAL LIGATION  ?   UPPER GASTROINTESTINAL ENDOSCOPY     VITRECTOMY Right 10/18/2020     A IV Location/Drains/Wounds Patient Lines/Drains/Airways Status     Active Line/Drains/Airways     Name Placement date Placement time Site Days   Peripheral IV 03/08/23 20 G Right Antecubital 03/08/23  1431  Antecubital  less than 1            Intake/Output Last 24 hours No  intake or output data in the 24 hours ending 03/08/23 1831  Labs/Imaging Results for orders placed or performed during the hospital encounter of 03/08/23 (from the past 48 hour(s))  SARS Coronavirus 2 by RT PCR (hospital order, performed in [facility] hospital lab) *cepheid single result test* Urine, Clean Catch     Status: None   Collection Time: 03/08/23  1:46 PM   Specimen: Urine, Clean Catch; Nasal Swab  Result Value Ref Range   SARS Coronavirus 2 by RT PCR NEGATIVE NEGATIVE    Comment: (NOTE) SARS-CoV-2 target nucleic acids are NOT DETECTED.  The SARS-CoV-2 RNA is generally detectable in upper and lower respiratory specimens during the acute phase of infection. The lowest concentration of SARS-CoV-2 viral copies this assay can detect is 250 copies / mL.  A negative result does not preclude SARS-CoV-2 infection and should not be used as the sole basis for treatment or other patient management decisions.  A negative result may occur with improper specimen collection / handling, submission of specimen other than nasopharyngeal swab, presence of viral mutation(s) within the areas targeted by this assay, and inadequate number of viral copies (<250 copies / mL). A negative result must be combined with clinical observations, patient history, and epidemiological information.  Fact Sheet for Patients:   RoadLapTop.co.za  Fact Sheet for Healthcare Providers: http://kim-miller.com/  This test is not yet approved or  cleared by the [place] FDA and has been authorized for detection and/or diagnosis of SARS-CoV-2 by FDA under an Emergency Use Authorization (EUA).  This EUA will remain in effect (meaning this test can be used) for the duration of the COVID-19 declaration under Section 564(b)(1) of the Act, 21 U.S.C. section 360bbb-3(b)(1), unless the authorization is terminated or revoked sooner.  Performed at [facility], [address] [place], [place] [address]   Urinalysis, Routine w reflex microscopic -Urine, Clean Catch     Status: Abnormal   Collection Time: 03/08/23  1:51 PM  Result Value Ref Range   Color, Urine YELLOW YELLOW   APPearance CLEAR CLEAR   Specific Gravity, Urine 1.025 1.005 - 1.030   pH 5.0 5.0 - 8.0   Glucose, UA NEGATIVE NEGATIVE mg/dL   Hgb urine dipstick NEGATIVE NEGATIVE   Bilirubin Urine SMALL (A) NEGATIVE   Ketones, ur 15 (A) NEGATIVE mg/dL   Protein, ur 30 (A) NEGATIVE mg/dL   Nitrite NEGATIVE NEGATIVE   Leukocytes,Ua NEGATIVE NEGATIVE    Comment: Performed at [facility], [address] [place], [place] [address]  Urinalysis, Microscopic (reflex)     Status: Abnormal   Collection Time: 03/08/23  1:51 PM  Result Value Ref Range   RBC / HPF 0-5 0 - 5 RBC/hpf   WBC, UA 0-5 0 - 5 WBC/hpf   Bacteria, UA MANY (A) NONE SEEN   Squamous Epithelial / HPF 11-20 0 - 5 /HPF    Comment: Performed at [facility], [address] [place], [place] [address]  CBG monitoring, ED     Status: Abnormal   Collection Time: 03/08/23  2:01 PM  Result Value Ref Range   Glucose-Capillary 159 (H) 70 - 99 mg/dL    Comment: Glucose reference range applies only to samples taken after fasting for at least 8 hours.  Basic metabolic panel     Status: Abnormal   Collection Time: 03/08/23  2:11 PM  Result Value Ref Range   Sodium 135 135 - 145 mmol/L   Potassium 3.8 3.5 - 5.1 mmol/L   Chloride 100 98 - 111 mmol/L   CO2 23 22 - 32 mmol/L   Glucose, Bld 157 (H) 70 - 99 mg/dL    Comment: Glucose reference range applies only to samples taken after fasting for at least 8 hours.   BUN 14 8 - 23 mg/dL   Creatinine, Ser 1.30 0.44 - 1.00 mg/dL   Calcium 8.4 (L) 8.9 - 10.3 mg/dL   GFR, Estimated 60 (L) >60 mL/min    Comment: (NOTE) Calculated using the CKD-EPI Creatinine Equation (2021)    Anion gap 12 5 - 15    Comment: Performed at [facility], [address]  [place], [place] [address]  CBC     Status: None   Collection  Time: 03/08/23  2:11 PM  Result Value Ref Range   WBC 10.3 4.0 - 10.5 K/uL   RBC 4.21 3.87 - 5.11 MIL/uL   Hemoglobin 12.6 12.0 - 15.0 g/dL   HCT 16.1 09.6 - 04.5 %   MCV 89.5 80.0 - 100.0 fL   MCH 29.9 26.0 - 34.0 pg   MCHC 33.4 30.0 - 36.0 g/dL   RDW 40.9 81.1 - 91.4 %   Platelets 203 150 - 400 K/uL   nRBC 0.0 0.0 - 0.2 %    Comment: Performed at [facility], [address] [place], [place] [address]  Differential     Status: Abnormal   Collection Time: 03/08/23  2:11 PM  Result Value Ref Range   Neutrophils Relative % 70 %   Neutro Abs 7.2 1.7 - 7.7 K/uL   Lymphocytes Relative 12 %   Lymphs Abs 1.2 0.7 - 4.0 K/uL   Monocytes Relative 16 %   Monocytes Absolute 1.6 (H) 0.1 - 1.0 K/uL   Eosinophils Relative 0 %   Eosinophils Absolute 0.0 0.0 - 0.5 K/uL   Basophils Relative 1 %   Basophils Absolute 0.1 0.0 - 0.1 K/uL   Immature Granulocytes 1 %   Abs Immature Granulocytes 0.14 (H) 0.00 - 0.07 K/uL    Comment: Performed at [facility], [address] [place], [place] [address]  Hepatic function panel     Status: Abnormal   Collection Time: 03/08/23  2:11 PM  Result Value Ref Range   Total Protein 6.8 6.5 - 8.1 g/dL   Albumin 3.4 (L) 3.5 - 5.0 g/dL   AST 23 15 - 41 U/L   ALT 13 0 - 44 U/L   Alkaline Phosphatase 97 38 - 126 U/L   Total Bilirubin 0.4 0.3 - 1.2 mg/dL   Bilirubin, Direct 0.1 0.0 - 0.2 mg/dL   Indirect Bilirubin 0.3 0.3 - 0.9 mg/dL    Comment: Performed at [facility], [address] [place], [place] [address]  Lactic acid, plasma     Status: None   Collection Time: 03/08/23  2:29 PM  Result Value Ref Range   Lactic Acid, Venous 1.9 0.5 - 1.9 mmol/L    Comment: Performed at [facility], [address] [place], [place] [address]   CT Angio Chest PE W and/or Wo Contrast  Result Date: 03/08/2023 CLINICAL DATA:  Pulmonary embolism (PE) suspected, high  prob. Cough. Fever with chills. Increased weakness. EXAM: CT ANGIOGRAPHY CHEST WITH CONTRAST TECHNIQUE: Multidetector CT imaging of the chest was performed using the standard protocol during bolus administration of intravenous contrast. Multiplanar CT image reconstructions and MIPs were obtained to evaluate the vascular anatomy. RADIATION DOSE REDUCTION: This exam was performed according to the departmental dose-optimization program which includes automated exposure control, adjustment of the mA and/or kV according to patient size and/or use of iterative reconstruction technique. CONTRAST:  75mL OMNIPAQUE IOHEXOL 350 MG/ML SOLN COMPARISON:  CT scan chest from 02/25/2023. FINDINGS: Cardiovascular: No evidence of embolism to the proximal subsegmental pulmonary artery level. Normal cardiac size. No pericardial effusion. No aortic aneurysm. Mediastinum/Nodes: Visualized thyroid gland appears grossly unremarkable. No solid / cystic mediastinal masses. The esophagus is nondistended precluding optimal assessment. No axillary, mediastinal or hilar lymphadenopathy by size criteria. Lungs/Pleura: The central tracheo-bronchial tree is patent. Minimal upper lobe predominant centrilobular emphysema noted. There is consolidation in the medial segment of the middle lobe with air bronchogram, compatible  with pneumonia. There are additional patchy opacities throughout bilateral lungs favored to represent pneumonia as well there are innumerable subcentimeter sized noncalcified nodules throughout bilateral lungs which are nonspecific but favored to represent endobronchial spread of infection as well. No pleural effusion or pneumothorax. Upper Abdomen: Evaluation of upper abdominal viscera is markedly limited due to streak artifacts from spinal fixation hardware. Visualized upper abdominal viscera are grossly within normal limits. Musculoskeletal: The visualized soft tissues of the chest wall are grossly unremarkable. No suspicious  osseous lesions. There are mild multilevel degenerative changes in the visualized spine. Review of the MIP images confirms the above findings. IMPRESSION: 1. No evidence of pulmonary embolism to the proximal subsegmental pulmonary artery level. 2. Multifocal pneumonia with consolidation in the medial segment of the middle lobe. 3. Innumerable subcentimeter sized noncalcified nodules throughout bilateral lungs which are nonspecific but favored to represent endobronchial spread of infection as well. Short-term follow-up examination in 3 months is recommended to document resolution. Electronically Signed   By: Jules Schick M.D.   On: 03/08/2023 16:30    Pending Labs Unresulted Labs (From admission, onward)     Start     Ordered   03/08/23 1714  MRSA Next Gen by PCR, Nasal  (MRSA Screening)  Once,   URGENT        03/08/23 1714   03/08/23 1410  Blood culture (routine x 2)  BLOOD CULTURE X 2,   STAT      03/08/23 1412   03/08/23 1410  Lactic acid, plasma  (Lactic Acid)  Now then every 2 hours,   R (with STAT occurrences)      03/08/23 1412            Vitals/Pain Today's Vitals   03/08/23 1716 03/08/23 1730 03/08/23 1745 03/08/23 1800  BP: 105/63 106/61 (!) 104/57 103/60  Pulse: (!) 104 (!) 101 97 93  Resp: 18 14 11 15   Temp:      TempSrc:      SpO2: 93% 90% 95% 95%  Weight:      Height:      PainSc:    0-No pain    Isolation Precautions Airborne and Contact precautions  Medications Medications  ondansetron (ZOFRAN) injection 4 mg (4 mg Intravenous Not Given 03/08/23 1642)  piperacillin-tazobactam (ZOSYN) IVPB 3.375 g (3.375 g Intravenous Handoff 03/08/23 1822)  vancomycin (VANCOCIN) IVPB 1000 mg/200 mL premix (1,000 mg Intravenous Handoff 03/08/23 1822)  0.9 %  sodium chloride infusion ( Intravenous Handoff 03/08/23 1823)  0.9 %  sodium chloride infusion (has no administration in time range)  iohexol (OMNIPAQUE) 350 MG/ML injection 75 mL (75 mLs Intravenous Contrast Given 03/08/23  1553)    Mobility walks     Focused Assessments Pulmonary Assessment Handoff:  Lung sounds:   O2 Device: Room Air      R Recommendations: See Admitting Provider Note  Report given to: Dorathy Daft, RN  Additional Notes:

## 2023-03-08 NOTE — ED Notes (Signed)
[facility] at bedside to assume care.

## 2023-03-08 NOTE — Telephone Encounter (Signed)
See other phone note. Two notes going on regarding the same thing

## 2023-03-08 NOTE — ED Provider Notes (Signed)
[place] [facility] AT [facility] Provider Note   CSN: [id number] Arrival date & time: 03/08/23  1338     History  Chief Complaint  Patient presents with   Weakness   Fever   Cough    Joan [name] is a 77 y.o. female.  Patient with a history of active smoker with COPD managed with Trelegy and albuterol, pulmonary nodules, GERD, hypothyroidism, osteopenia, glaucoma, HTN, HLD, hx PVC and chronic lymphedema -- presents to the emergency department today for evaluation of subjective fever and chills, nausea and vomiting, diarrhea, cough and shortness of breath.  Patient began having a cough around August 5 or so.  She was seen by her PCP on 02/18/2023 for the symptoms and had a chest x-ray which was abnormal.  She was given a course of levofloxacin at that time which she has completed.  She does report some mild subjective improvement but then again worsening over the past 3 to 4 days.  She feels very weak.  She has been vomiting intermittently with some diarrhea.  She has not had a thermometer to check her temperature but has had shivering chills at home.  She contacted her pulmonologist who recommended workup in the emergency department.  She did have a CT scan on 8/19 with results as below.  She denies abdominal pain or urinary symptoms.  No skin rash.  She has chronic lower extremity lymphedema which appears to be stable.  CT chest 02/25/23:  IMPRESSION: 1. New airspace disease in the right upper lobe with persistent airspace disease in the right middle lobe with increasing peribronchovascular nodularity as compared with the previous exam, which may be infectious or inflammatory. Three to six-month follow-up is recommended to document resolution and exclude the possibility of underlying neoplasm. 2. Emphysema. 3. Aortic atherosclerosis and coronary artery calcifications. 4. Stable right breast nodule.  Clinical correlation is recommended. 5. Hepatic  steatosis.     Home Medications Prior to Admission medications   Medication Sig Start Date End Date Taking? Authorizing Provider  Acetaminophen (TYLENOL 8 HOUR ARTHRITIS PAIN PO) Take by mouth.    [provider]  albuterol (VENTOLIN HFA) 108 (90 Base) MCG/ACT inhaler Inhale 2 puffs into the lungs every 6 (six) hours as needed for wheezing or shortness of breath. 02/18/23   Donato Schultz, DO  bisacodyl (DULCOLAX) 5 MG EC tablet Take 5-10 mg by mouth daily as needed for mild constipation.  Patient not taking: Reported on 03/01/2023 03/06/13   Artist Pais, Doe-Hyun R, DO  carisoprodol (SOMA) 350 MG tablet Take 175 mg by mouth 3 (three) times daily.     [provider]  diclofenac Sodium (VOLTAREN) 1 % GEL as needed.    [provider]  dorzolamide-timolol (COSOPT) 22.3-6.8 MG/ML ophthalmic solution Place 1 drop into the left eye 2 (two) times daily. 08/16/15   [provider]  estradiol (ESTRACE) 0.5 MG tablet Take 0.5 mg by mouth at bedtime.  12/21/17   [provider]  fluticasone Aleda Grana) 50 MCG/ACT nasal spray Use 2 spray(s) in each nostril once daily 09/26/22   Zola Button, Grayling Congress, DO  furosemide (LASIX) 40 MG tablet Take 2 tablets (80 mg total) by mouth daily. 01/14/23   Seabron Spates R, DO  ipratropium (ATROVENT) 0.06 % nasal spray Place 2 sprays into both nostrils 2 (two) times daily as needed for rhinitis. 03/30/22   Luciano Cutter, MD  levothyroxine (SYNTHROID) 88 MCG tablet Take 1 tablet (88 mcg  total) by mouth daily before breakfast. 02/26/23   Shamleffer, Konrad Dolores, MD  metFORMIN (GLUCOPHAGE) 500 MG tablet Take 1 tablet (500 mg total) by mouth 2 (two) times daily with a meal. Patient not taking: Reported on 03/01/2023 02/22/23   Seabron Spates R, DO  NONFORMULARY OR COMPOUNDED ITEM Compression socks  20-30 mm/ hg   Dx low ext edema 03/17/20   Zola Button, Grayling Congress, DO  omeprazole (PRILOSEC) 20 MG capsule Take 2 capsules by mouth  once daily 11/13/22   Zola Button, Grayling Congress, DO  oxyCODONE-acetaminophen (PERCOCET) 10-325 MG tablet Take 1 tablet by mouth every 4 (four) hours as needed for pain (fill 01/15/23) 01/15/23     oxyCODONE-acetaminophen (PERCOCET) 10-325 MG tablet Take 1 tablet by mouth every 4 (four) hours as needed for pain 02/21/23     oxyCODONE-acetaminophen (PERCOCET) 10-325 MG tablet Take 1 tablet by mouth every 4 (four) hours as needed for pain 03/22/23     potassium chloride SA (KLOR-CON M20) 20 MEQ tablet TAKE 4 TABLETS BY MOUTH DAILY 10/15/22   Donato Schultz, DO  Probiotic Product (PROBIOTIC ACIDOPHILUS BEADS PO) Take 1 tablet by mouth daily.    [provider]  simvastatin (ZOCOR) 40 MG tablet Take 1 tablet (40 mg total) by mouth at bedtime. 02/22/23   Donato Schultz, DO  spironolactone (ALDACTONE) 25 MG tablet Take 1 tablet (25 mg total) by mouth 2 (two) times daily. Patient taking differently: Take 25 mg by mouth 2 (two) times daily. One each morning and one in after 07/19/22   Zola Button, Grayling Congress, DO  traZODone (DESYREL) 50 MG tablet TAKE 1 TABLET BY MOUTH AT BEDTIME 02/14/23   Lowne Chase, Yvonne R, DO  TRELEGY ELLIPTA 100-62.5-25 MCG/ACT AEPB Inhale 1 puff into the lungs daily. 08/02/22   Seabron Spates R, DO  triamcinolone cream (KENALOG) 0.1 % Apply topically. 09/12/20   [provider]      Allergies    Metformin and related, Morphine and codeine, 5-alpha reductase inhibitors, Amitriptyline, Augmentin [amoxicillin-pot clavulanate], Gabapentin, and Triamterene    Review of Systems   Review of Systems  Physical Exam Updated Vital Signs BP 125/68 (BP Location: Right Arm)   Pulse (!) 107   Temp 98.3 F (36.8 C) (Oral)   Resp 16   Ht 5' (1.524 m)   Wt 75 kg   SpO2 95%   BMI 32.29 kg/m   Physical Exam Vitals and nursing note reviewed.  Constitutional:      General: She is not in acute distress.    Appearance: She is well-developed.  HENT:     Head: Normocephalic  and atraumatic.     Right Ear: External ear normal.     Left Ear: External ear normal.     Nose: Nose normal.  Eyes:     Conjunctiva/sclera: Conjunctivae normal.  Cardiovascular:     Rate and Rhythm: Normal rate and regular rhythm.     Heart sounds: No murmur heard. Pulmonary:     Effort: No respiratory distress.     Breath sounds: Decreased breath sounds (generally) and rhonchi (scattered) present. No wheezing or rales.  Abdominal:     Palpations: Abdomen is soft.     Tenderness: There is no abdominal tenderness. There is no guarding or rebound.  Musculoskeletal:     Cervical back: Normal range of motion and neck supple.     Right lower leg: No edema.     Left lower leg:  No edema.  Skin:    General: Skin is warm and dry.     Findings: No rash.  Neurological:     General: No focal deficit present.     Mental Status: She is alert. Mental status is at baseline.     Motor: No weakness.  Psychiatric:        Mood and Affect: Mood normal.     ED Results / Procedures / Treatments   Labs (all labs ordered are listed, but only abnormal results are displayed) Labs Reviewed  BASIC METABOLIC PANEL - Abnormal; Notable for the following components:      Result Value   Glucose, Bld 157 (*)    Calcium 8.4 (*)    GFR, Estimated 60 (*)    All other components within normal limits  URINALYSIS, ROUTINE W REFLEX MICROSCOPIC - Abnormal; Notable for the following components:   Bilirubin Urine SMALL (*)    Ketones, ur 15 (*)    Protein, ur 30 (*)    All other components within normal limits  DIFFERENTIAL - Abnormal; Notable for the following components:   Monocytes Absolute 1.6 (*)    Abs Immature Granulocytes 0.14 (*)    All other components within normal limits  HEPATIC FUNCTION PANEL - Abnormal; Notable for the following components:   Albumin 3.4 (*)    All other components within normal limits  URINALYSIS, MICROSCOPIC (REFLEX) - Abnormal; Notable for the following components:    Bacteria, UA MANY (*)    All other components within normal limits  CBG MONITORING, ED - Abnormal; Notable for the following components:   Glucose-Capillary 159 (*)    All other components within normal limits  SARS CORONAVIRUS 2 BY RT PCR  CULTURE, BLOOD (ROUTINE X 2)  CULTURE, BLOOD (ROUTINE X 2)  CBC  LACTIC ACID, PLASMA  LACTIC ACID, PLASMA    EKG EKG Interpretation Date/Time:  Friday March 08 2023 13:59:16 EDT Ventricular Rate:  106 PR Interval:  193 QRS Duration:  80 QT Interval:  325 QTC Calculation: 432 R Axis:   57  Text Interpretation: Sinus tachycardia Abnormal R-wave progression, early transition Confirmed by Vanetta Mulders [phone]) on 03/08/2023 5:18:01 PM  Radiology CT Angio Chest PE W and/or Wo Contrast  Result Date: 03/08/2023 CLINICAL DATA:  Pulmonary embolism (PE) suspected, high prob. Cough. Fever with chills. Increased weakness. EXAM: CT ANGIOGRAPHY CHEST WITH CONTRAST TECHNIQUE: Multidetector CT imaging of the chest was performed using the standard protocol during bolus administration of intravenous contrast. Multiplanar CT image reconstructions and MIPs were obtained to evaluate the vascular anatomy. RADIATION DOSE REDUCTION: This exam was performed according to the departmental dose-optimization program which includes automated exposure control, adjustment of the mA and/or kV according to patient size and/or use of iterative reconstruction technique. CONTRAST:  75mL OMNIPAQUE IOHEXOL 350 MG/ML SOLN COMPARISON:  CT scan chest from 02/25/2023. FINDINGS: Cardiovascular: No evidence of embolism to the proximal subsegmental pulmonary artery level. Normal cardiac size. No pericardial effusion. No aortic aneurysm. Mediastinum/Nodes: Visualized thyroid gland appears grossly unremarkable. No solid / cystic mediastinal masses. The esophagus is nondistended precluding optimal assessment. No axillary, mediastinal or hilar lymphadenopathy by size criteria. Lungs/Pleura: The  central tracheo-bronchial tree is patent. Minimal upper lobe predominant centrilobular emphysema noted. There is consolidation in the medial segment of the middle lobe with air bronchogram, compatible with pneumonia. There are additional patchy opacities throughout bilateral lungs favored to represent pneumonia as well there are innumerable subcentimeter sized noncalcified nodules throughout bilateral lungs which are  nonspecific but favored to represent endobronchial spread of infection as well. No pleural effusion or pneumothorax. Upper Abdomen: Evaluation of upper abdominal viscera is markedly limited due to streak artifacts from spinal fixation hardware. Visualized upper abdominal viscera are grossly within normal limits. Musculoskeletal: The visualized soft tissues of the chest wall are grossly unremarkable. No suspicious osseous lesions. There are mild multilevel degenerative changes in the visualized spine. Review of the MIP images confirms the above findings. IMPRESSION: 1. No evidence of pulmonary embolism to the proximal subsegmental pulmonary artery level. 2. Multifocal pneumonia with consolidation in the medial segment of the middle lobe. 3. Innumerable subcentimeter sized noncalcified nodules throughout bilateral lungs which are nonspecific but favored to represent endobronchial spread of infection as well. Short-term follow-up examination in 3 months is recommended to document resolution. Electronically Signed   By: Jules Schick M.D.   On: 03/08/2023 16:30    Procedures Procedures    Medications Ordered in ED Medications  ondansetron (ZOFRAN) injection 4 mg (4 mg Intravenous Not Given 03/08/23 1642)  iohexol (OMNIPAQUE) 350 MG/ML injection 75 mL (75 mLs Intravenous Contrast Given 03/08/23 1553)    ED Course/ Medical Decision Making/ A&P    Patient seen and examined. History obtained directly from patient.  I also reviewed previous PCP notes in regards to her recent pneumonia, reviewed  recent CT imaging and pulmonology recommendations.  Labs/EKG: Personally reviewed and interpreted lab work ordered in triage including CBC with a white blood cell count of 10.3 and hemoglobin of 12.6 otherwise unremarkable; unremarkable differential; BMP with normal electrolytes, glucose elevated at 157 with normal creatinine; hepatic function panel unremarkable; lactate normal at 1.9; UA without compelling signs of infection, not clean-catch; COVID-negative.  Imaging: Ordered CT angio of the chest.  Medications/Fluids: Ordered: Zofran  Most recent vital signs reviewed and are as follows: BP 136/70   Pulse 98   Temp 98.3 F (36.8 C)   Resp 16   Ht 5' (1.524 m)   Wt 75 kg   SpO2 95%   BMI 32.29 kg/m   Initial impression: Question of pneumonia, also nausea, vomiting, diarrhea without significant abdominal pain.  4:41 PM Reassessment performed. Patient appears stable.  She is asking to take her home pain medication.  I have given her permission to do so as she will likely be admitted to the hospital and will not be driving.  Imaging personally visualized and interpreted including: CT angio of the chest, agree multifocal pneumonia, no PE.  Reviewed pertinent lab work and imaging with patient at bedside. Questions answered.   Most current vital signs reviewed and are as follows: BP 136/70   Pulse 98   Temp 98.3 F (36.8 C)   Resp 16   Ht 5' (1.524 m)   Wt 75 kg   SpO2 95%   BMI 32.29 kg/m   Plan: Will plan for IV antibiotics and admission to the hospital.  Patient overall appears well, will discuss antibiotic choice with hospitalist.  Patient was discussed with Dr. Janee Morn, [facility] hospitalist.  Accepts for admission.  Requests Zosyn and vancomycin for initial antibiotic treatment for multifocal pneumonia.  I was also able to update and discuss plan with patient's son by telephone.                                 Medical Decision Making Amount and/or Complexity of Data  Reviewed Labs: ordered. Radiology: ordered.  Risk Prescription drug management.  Decision regarding hospitalization.   Patient with worsening weakness, cough, vomiting in setting of recently treated pneumonia.  CT suggests worsening of multifocal pneumonia.  Given failure of outpatient therapy, patient will be admitted.         Final Clinical Impression(s) / ED Diagnoses Final diagnoses:  Multifocal pneumonia    Rx / DC Orders ED Discharge Orders     None         Renne Crigler, PA-C 03/08/23 2354    Vanetta Mulders, MD 03/10/23 2326

## 2023-03-08 NOTE — Telephone Encounter (Signed)
Spoke with Lillia Abed. She advises that she spoke with the patient's son regarding the imaging but did not discuss the metformin

## 2023-03-08 NOTE — Telephone Encounter (Signed)
Pt's son called to discuss her worsening pneumonia. He would like for someone to discuss her CT and X-ray results from when she was diagnosed on 8/12 since pt was unable to explain ton him. Pt's son also informed that pt was not taking her metformin since she had a reaction to the medication. He would also like to talk about that as well. His number is  [phone]. Pt is scheduled to see another provider today at 1:20.

## 2023-03-08 NOTE — H&P (Addendum)
History and Physical    Patient: Joan [name] IDP:[id number] DOB: 31-Jul-1945 DOA: 03/08/2023 DOS: the patient was seen and examined on 03/08/2023 PCP: Donato Schultz, DO  Patient coming from: Home sent by PCP  Chief Complaint:  Chief Complaint  Patient presents with   Weakness   Fever   Cough   HPI: Joan [name] is a 77 y.o. female with medical history significant of COPD as noted in chart (but denied by patient).  Patient was in her usual state of health till about 02/18/2023 when she had a new onset cough. Patient doe snot report any expectoration.   Per record review from PCP:  seen in office 8/12 for cough, prescribed doxycycline, and an xray was ordered.    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   CT 8/19 demonstrated New airspace disease in the right upper lobe with persistent airspace disease in the right middle lobe . Pt followed up via mychart with pulm, who agreed this right upper lobe developed was consistent w/ pneumonia.   Per report from patient she was started on doxycycline - took it for three days but was vomiting- changed to levofloxacin - took all of it. Finished it "a few days" ago.   Patient came back to the primary care office earlier today complaining of ongoing extreme weakness, sensation of shortness of breath as well as reportedly vomiting at home.  However to me she only reports her extreme weakness as being the problem she denies any chest pain except when she coughs and denies any shortness of breath.  She reports subjective fevers feeling extremely cold but has not taken her temperature at home.  There is no report of any leg swelling or diarrhea or any ongoing vomiting.  [facility] care provider transferred the patient to the ER for failure of outpatient antibiotic therapy, patient subsequently received Zosyn and has been transferred to  the stepdown unit at [facility].  Medical evaluation is sought.  Patient reports no specific complaints at the time of this encounter.      Review of Systems: As mentioned in the history of present illness. All other systems reviewed and are negative. Past Medical History:  Diagnosis Date   Allergy ?   Arthritis    "shoulders; wrist; probably spine" (06/24/2013)   Blood transfusion without reported diagnosis    Cancer (HCC)    skin cancer right leg x 4     Cataract    removed both eyes    Chronic low back pain    followed by Dr Vear Clock pain mgt   Colon polyp    Constipation    COPD (chronic obstructive pulmonary disease) (HCC)    mild   Esophageal stricture    Finger pain, left    2 fingers on left hand since wrist surgery   GERD (gastroesophageal reflux disease)    Glaucoma    Heart murmur    "slight; not on RX" (06/24/2013)   History of cardiac arrhythmia    cardiologist- Traci Turner   Hx of colonic polyps 08/28/2004   Hyperlipemia    Hypertension    Hypothyroidism    Osteoarthritis of left knee    advanced   Osteoporosis ?   PONV (postoperative nausea and vomiting)    Pt reports symptoms are the result of gallbladder and cholecystectomy, not anesthesia   PVC's (premature ventricular contractions)    Sleep  apnea 1990's   "tested; tried mask; couldn't stand it; told me as long as I slept on my side I'd be ok" (06/24/2013)   Thyroid nodule    Tobacco abuse    Past Surgical History:  Procedure Laterality Date   ABDOMINAL HYSTERECTOMY  1988   "partial" (06/24/2013)   ABDOMINAL HYSTERECTOMY     partial in 1988   ANKLE SURGERY Left 1995   "tendon repair" (06/24/2013)   BACK SURGERY     "think today was my 8th back OR" (06/24/2013)   CERVICAL SPINE SURGERY  2012   CESAREAN SECTION  1972   CHOLECYSTECTOMY N/A 04/16/2013   Procedure: LAPAROSCOPIC CHOLECYSTECTOMY ;  Surgeon: Wilmon Arms. Corliss Skains, MD;  Location: [facility];  Service: General;  Laterality: N/A;    COLONOSCOPY     ELBOW SURGERY  1990's   EYE SURGERY  ?   FOOT SURGERY Right 2012   SPUR REMOVED   FRACTURE SURGERY  ?   INFUSION PUMP IMPLANTATION  1990's   "implantablet morphine pump; took it out w/in 11 months   JOINT REPLACEMENT  ?   KNEE ARTHROSCOPY Left 1991; ~ 1993   LAPAROSCOPIC LYSIS OF ADHESIONS N/A 04/16/2013   Procedure: LAPAROSCOPIC LYSIS OF ADHESIONS;  Surgeon: Wilmon Arms. Corliss Skains, MD;  Location: [facility];  Service: General;  Laterality: N/A;   LUMBAR FUSION     and rods   LUMBAR LAMINECTOMY/DECOMPRESSION MICRODISCECTOMY N/A 11/28/2012   Procedure: DECOMPRESSIVE LUMBAR LAMINECTOMY LEVEL 1;  Surgeon: Mariam Dollar, MD;  Location: [facility];  Service: Neurosurgery;  Laterality: N/A;  DECOMPRESSIVE LUMBAR LAMINECTOMY LEVEL 1   ORIF DISTAL RADIUS FRACTURE Left 12/30/2013   dr Melvyn Novas   ORIF WRIST FRACTURE Left 12/30/2013   Procedure: OPEN REDUCTION INTERNAL FIXATION (ORIF) LEFT WRIST FRACTURE AND REPAIR AS INDICATED;  Surgeon: Sharma Covert, MD;  Location: [facility];  Service: Orthopedics;  Laterality: Left;   POLYPECTOMY     POSTERIOR FUSION LUMBAR SPINE  06/24/2013   ROBOTIC ASSISTED SALPINGO OOPHERECTOMY Bilateral 08/20/2014   Procedure: ROBOTIC ASSISTED SALPINGO OOPHORECTOMY;  Surgeon: Loney Laurence, MD;  Location: [facility];  Service: Gynecology;  Laterality: Bilateral;   SHOULDER ARTHROSCOPY Left 09/2011   SPINE SURGERY  8 times   THYROIDECTOMY  2012   TOTAL HIP ARTHROPLASTY Left 04/27/2019   Procedure: TOTAL HIP ARTHROPLASTY ANTERIOR APPROACH;  Surgeon: Ollen Gross, MD;  Location: [facility];  Service: Orthopedics;  Laterality: Left;    TOTAL HIP ARTHROPLASTY Right 09/09/2019   Procedure: TOTAL HIP ARTHROPLASTY ANTERIOR APPROACH;  Surgeon: Ollen Gross, MD;  Location: [facility];  Service: Orthopedics;  Laterality: Right;    TOTAL KNEE ARTHROPLASTY Right 09/09/2017   Procedure: RIGHT TOTAL KNEE ARTHROPLASTY;  Surgeon: Ollen Gross, MD;  Location: [facility];   Service: Orthopedics;  Laterality: Right;   TUBAL LIGATION  ?   UPPER GASTROINTESTINAL ENDOSCOPY     VITRECTOMY Right 10/18/2020   Social History:  reports that she has been smoking cigarettes. She has a 75 pack-year smoking history. She has never used smokeless tobacco. She reports that she does not drink alcohol and does not use drugs.  Allergies  Allergen Reactions   Metformin And Related Nausea And Vomiting   Morphine And Codeine Swelling   5-Alpha Reductase Inhibitors    Amitriptyline Swelling    Leg swelling   Augmentin [Amoxicillin-Pot Clavulanate] Nausea And Vomiting   Gabapentin Itching   Triamterene Itching and Rash    Family History  Problem Relation Age of  Onset   Pancreatic cancer Father    Cancer Father    Diabetes type II Mother    Hypertension Mother    Coronary artery disease Mother 31   Diabetes Mother    Thyroid cancer Mother    Skin cancer Mother    Diabetes Sister    Hypertension Sister    Bone cancer Sister    Breast cancer Sister    Hypertension Brother    Breast cancer Maternal Aunt    Other Neg Hx        No family history of  colon cancer   Colon cancer Neg Hx    Colon polyps Neg Hx    Esophageal cancer Neg Hx    Stomach cancer Neg Hx    Rectal cancer Neg Hx     Prior to Admission medications   Medication Sig Start Date End Date Taking? Authorizing Provider  Acetaminophen (TYLENOL 8 HOUR ARTHRITIS PAIN PO) Take by mouth.    [provider]  albuterol (VENTOLIN HFA) 108 (90 Base) MCG/ACT inhaler Inhale 2 puffs into the lungs every 6 (six) hours as needed for wheezing or shortness of breath. 02/18/23   Donato Schultz, DO  bisacodyl (DULCOLAX) 5 MG EC tablet Take 5-10 mg by mouth daily as needed for mild constipation.  Patient not taking: Reported on 03/01/2023 03/06/13   Artist Pais, Doe-Hyun R, DO  carisoprodol (SOMA) 350 MG tablet Take 175 mg by mouth 3 (three) times daily.     [provider]  diclofenac Sodium (VOLTAREN) 1 %  GEL as needed.    [provider]  dorzolamide-timolol (COSOPT) 22.3-6.8 MG/ML ophthalmic solution Place 1 drop into the left eye 2 (two) times daily. 08/16/15   [provider]  estradiol (ESTRACE) 0.5 MG tablet Take 0.5 mg by mouth at bedtime.  12/21/17   [provider]  fluticasone Aleda Grana) 50 MCG/ACT nasal spray Use 2 spray(s) in each nostril once daily 09/26/22   Zola Button, Grayling Congress, DO  furosemide (LASIX) 40 MG tablet Take 2 tablets (80 mg total) by mouth daily. 01/14/23   Seabron Spates R, DO  ipratropium (ATROVENT) 0.06 % nasal spray Place 2 sprays into both nostrils 2 (two) times daily as needed for rhinitis. 03/30/22   Luciano Cutter, MD  levothyroxine (SYNTHROID) 88 MCG tablet Take 1 tablet (88 mcg total) by mouth daily before breakfast. 02/26/23   Shamleffer, Konrad Dolores, MD  metFORMIN (GLUCOPHAGE) 500 MG tablet Take 1 tablet (500 mg total) by mouth 2 (two) times daily with a meal. 02/22/23   Zola Button, Grayling Congress, DO  NONFORMULARY OR COMPOUNDED ITEM Compression socks  20-30 mm/ hg   Dx low ext edema 03/17/20   Zola Button, Grayling Congress, DO  omeprazole (PRILOSEC) 20 MG capsule Take 2 capsules by mouth once daily 11/13/22   Zola Button, Grayling Congress, DO  oxyCODONE-acetaminophen (PERCOCET) 10-325 MG tablet Take 1 tablet by mouth every 4 (four) hours as needed for pain (fill 01/15/23) 01/15/23     oxyCODONE-acetaminophen (PERCOCET) 10-325 MG tablet Take 1 tablet by mouth every 4 (four) hours as needed for pain 02/21/23     oxyCODONE-acetaminophen (PERCOCET) 10-325 MG tablet Take 1 tablet by mouth every 4 (four) hours as needed for pain 03/22/23     potassium chloride SA (KLOR-CON M20) 20 MEQ tablet TAKE 4 TABLETS BY MOUTH DAILY 10/15/22   Donato Schultz, DO  Probiotic Product (PROBIOTIC ACIDOPHILUS BEADS PO) Take 1 tablet by mouth daily.  [provider]  simvastatin (ZOCOR) 40 MG tablet Take 1 tablet (40 mg total) by mouth at bedtime. 02/22/23   Donato Schultz, DO  spironolactone (ALDACTONE) 25 MG tablet Take 1 tablet (25 mg total) by mouth 2 (two) times daily. Patient taking differently: Take 25 mg by mouth 2 (two) times daily. One each morning and one in after 07/19/22   Zola Button, Grayling Congress, DO  traZODone (DESYREL) 50 MG tablet TAKE 1 TABLET BY MOUTH AT BEDTIME 02/14/23   Lowne Chase, Yvonne R, DO  TRELEGY ELLIPTA 100-62.5-25 MCG/ACT AEPB Inhale 1 puff into the lungs daily. 08/02/22   Seabron Spates R, DO  triamcinolone cream (KENALOG) 0.1 % Apply topically. 09/12/20   [provider]    Physical Exam: Vitals:   03/08/23 1745 03/08/23 1800 03/08/23 1815 03/08/23 1918  BP: (!) 104/57 103/60 104/60   Pulse: 97 93 93   Resp: 11 15 15    Temp:    98.2 F (36.8 C)  TempSrc:    Oral  SpO2: 95% 95% 93%   Weight:    75 kg  Height:    5' (1.524 m)   General: Patient is alert, sitting in bed, however slow speaking, complains of marked weakness.  Although no discharge no distress is apparent her fatigue is clearly seen. Respiratory exam: Bilateral intravesicular Cardiovascular exam S1-S2 normal Abdomen all quadrants soft nontender Extremities warm without edema  Data Reviewed:  Labs on Admission:  Results for orders placed or performed during the hospital encounter of 03/08/23 (from the past 24 hour(s))  SARS Coronavirus 2 by RT PCR (hospital order, performed in [facility] hospital lab) *cepheid single result test* Urine, Clean Catch     Status: None   Collection Time: 03/08/23  1:46 PM   Specimen: Urine, Clean Catch; Nasal Swab  Result Value Ref Range   SARS Coronavirus 2 by RT PCR NEGATIVE NEGATIVE  Urinalysis, Routine w reflex microscopic -Urine, Clean Catch     Status: Abnormal   Collection Time: 03/08/23  1:51 PM  Result Value Ref Range   Color, Urine YELLOW YELLOW   APPearance CLEAR CLEAR   Specific Gravity, Urine 1.025 1.005 - 1.030   pH 5.0 5.0 - 8.0   Glucose, UA NEGATIVE NEGATIVE mg/dL   Hgb urine dipstick  NEGATIVE NEGATIVE   Bilirubin Urine SMALL (A) NEGATIVE   Ketones, ur 15 (A) NEGATIVE mg/dL   Protein, ur 30 (A) NEGATIVE mg/dL   Nitrite NEGATIVE NEGATIVE   Leukocytes,Ua NEGATIVE NEGATIVE  Urinalysis, Microscopic (reflex)     Status: Abnormal   Collection Time: 03/08/23  1:51 PM  Result Value Ref Range   RBC / HPF 0-5 0 - 5 RBC/hpf   WBC, UA 0-5 0 - 5 WBC/hpf   Bacteria, UA MANY (A) NONE SEEN   Squamous Epithelial / HPF 11-20 0 - 5 /HPF  CBG monitoring, ED     Status: Abnormal   Collection Time: 03/08/23  2:01 PM  Result Value Ref Range   Glucose-Capillary 159 (H) 70 - 99 mg/dL  Basic metabolic panel     Status: Abnormal   Collection Time: 03/08/23  2:11 PM  Result Value Ref Range   Sodium 135 135 - 145 mmol/L   Potassium 3.8 3.5 - 5.1 mmol/L   Chloride 100 98 - 111 mmol/L   CO2 23 22 - 32 mmol/L   Glucose, Bld 157 (H) 70 - 99 mg/dL   BUN 14 8 - 23 mg/dL  Creatinine, Ser 0.97 0.44 - 1.00 mg/dL   Calcium 8.4 (L) 8.9 - 10.3 mg/dL   GFR, Estimated 60 (L) >60 mL/min   Anion gap 12 5 - 15  CBC     Status: None   Collection Time: 03/08/23  2:11 PM  Result Value Ref Range   WBC 10.3 4.0 - 10.5 K/uL   RBC 4.21 3.87 - 5.11 MIL/uL   Hemoglobin 12.6 12.0 - 15.0 g/dL   HCT 95.6 21.3 - 08.6 %   MCV 89.5 80.0 - 100.0 fL   MCH 29.9 26.0 - 34.0 pg   MCHC 33.4 30.0 - 36.0 g/dL   RDW 57.8 46.9 - 62.9 %   Platelets 203 150 - 400 K/uL   nRBC 0.0 0.0 - 0.2 %  Differential     Status: Abnormal   Collection Time: 03/08/23  2:11 PM  Result Value Ref Range   Neutrophils Relative % 70 %   Neutro Abs 7.2 1.7 - 7.7 K/uL   Lymphocytes Relative 12 %   Lymphs Abs 1.2 0.7 - 4.0 K/uL   Monocytes Relative 16 %   Monocytes Absolute 1.6 (H) 0.1 - 1.0 K/uL   Eosinophils Relative 0 %   Eosinophils Absolute 0.0 0.0 - 0.5 K/uL   Basophils Relative 1 %   Basophils Absolute 0.1 0.0 - 0.1 K/uL   Immature Granulocytes 1 %   Abs Immature Granulocytes 0.14 (H) 0.00 - 0.07 K/uL  Hepatic function panel      Status: Abnormal   Collection Time: 03/08/23  2:11 PM  Result Value Ref Range   Total Protein 6.8 6.5 - 8.1 g/dL   Albumin 3.4 (L) 3.5 - 5.0 g/dL   AST 23 15 - 41 U/L   ALT 13 0 - 44 U/L   Alkaline Phosphatase 97 38 - 126 U/L   Total Bilirubin 0.4 0.3 - 1.2 mg/dL   Bilirubin, Direct 0.1 0.0 - 0.2 mg/dL   Indirect Bilirubin 0.3 0.3 - 0.9 mg/dL  Lactic acid, plasma     Status: None   Collection Time: 03/08/23  2:29 PM  Result Value Ref Range   Lactic Acid, Venous 1.9 0.5 - 1.9 mmol/L   Basic Metabolic Panel: Recent Labs  Lab 03/08/23 1411  NA 135  K 3.8  CL 100  CO2 23  GLUCOSE 157*  BUN 14  CREATININE 0.97  CALCIUM 8.4*   Liver Function Tests: Recent Labs  Lab 03/08/23 1411  AST 23  ALT 13  ALKPHOS 97  BILITOT 0.4  PROT 6.8  ALBUMIN 3.4*   No results for input(s): "LIPASE", "AMYLASE" in the last 168 hours. No results for input(s): "AMMONIA" in the last 168 hours. CBC: Recent Labs  Lab 03/08/23 1411  WBC 10.3  NEUTROABS 7.2  HGB 12.6  HCT 37.7  MCV 89.5  PLT 203   Cardiac Enzymes: No results for input(s): "CKTOTAL", "CKMB", "CKMBINDEX", "TROPONINIHS" in the last 168 hours.  BNP (last 3 results) No results for input(s): "PROBNP" in the last 8760 hours. CBG: Recent Labs  Lab 03/08/23 1401  GLUCAP 159*    Radiological Exams on Admission:  CT Angio Chest PE W and/or Wo Contrast  Result Date: 03/08/2023 CLINICAL DATA:  Pulmonary embolism (PE) suspected, high prob. Cough. Fever with chills. Increased weakness. EXAM: CT ANGIOGRAPHY CHEST WITH CONTRAST TECHNIQUE: Multidetector CT imaging of the chest was performed using the standard protocol during bolus administration of intravenous contrast. Multiplanar CT image reconstructions and MIPs were obtained to evaluate the  vascular anatomy. RADIATION DOSE REDUCTION: This exam was performed according to the departmental dose-optimization program which includes automated exposure control, adjustment of the mA  and/or kV according to patient size and/or use of iterative reconstruction technique. CONTRAST:  75mL OMNIPAQUE IOHEXOL 350 MG/ML SOLN COMPARISON:  CT scan chest from 02/25/2023. FINDINGS: Cardiovascular: No evidence of embolism to the proximal subsegmental pulmonary artery level. Normal cardiac size. No pericardial effusion. No aortic aneurysm. Mediastinum/Nodes: Visualized thyroid gland appears grossly unremarkable. No solid / cystic mediastinal masses. The esophagus is nondistended precluding optimal assessment. No axillary, mediastinal or hilar lymphadenopathy by size criteria. Lungs/Pleura: The central tracheo-bronchial tree is patent. Minimal upper lobe predominant centrilobular emphysema noted. There is consolidation in the medial segment of the middle lobe with air bronchogram, compatible with pneumonia. There are additional patchy opacities throughout bilateral lungs favored to represent pneumonia as well there are innumerable subcentimeter sized noncalcified nodules throughout bilateral lungs which are nonspecific but favored to represent endobronchial spread of infection as well. No pleural effusion or pneumothorax. Upper Abdomen: Evaluation of upper abdominal viscera is markedly limited due to streak artifacts from spinal fixation hardware. Visualized upper abdominal viscera are grossly within normal limits. Musculoskeletal: The visualized soft tissues of the chest wall are grossly unremarkable. No suspicious osseous lesions. There are mild multilevel degenerative changes in the visualized spine. Review of the MIP images confirms the above findings. IMPRESSION: 1. No evidence of pulmonary embolism to the proximal subsegmental pulmonary artery level. 2. Multifocal pneumonia with consolidation in the medial segment of the middle lobe. 3. Innumerable subcentimeter sized noncalcified nodules throughout bilateral lungs which are nonspecific but favored to represent endobronchial spread of infection as well.  Short-term follow-up examination in 3 months is recommended to document resolution. Electronically Signed   By: Jules Schick M.D.   On: 03/08/2023 16:30    EKG: Independently reviewed. Sinus tachy.  Images independently reviewed:   Image on the left is one from today showing progression of consolidative changes in right middle lobe inferior pole compared to one done on 02/25/2023 Assessment and Plan: * Multifocal pneumonia Initially diagnosed 02/18/2023 (CXR non diagnostic) s.p. 3 days of doxy that patient could not tolerate due to vomiting. Changed to levofloxacin. With CT 02/25/2023:New airspace disease in the right upper lobe with persistent airspace disease in the right middle lobe.  Patient now admitted to [facility] with complaints of persistent fatigue/sensation of shortness of breath/coughing.  And subjective fevers.  Patient has not checked her temperature at home.  CAT scan today on March 08, 2023 demonstrated progression of right middle lobe opacification compared to the CAT scan done on February 25, 2023.  Patient BP was soft in ER with good respnse to fluids. is appearing nontoxic no white count no fevers here.  Not hypoxic.  Patient is s/p blood cultures.  Patient has received single dose of Zosyn+vanco in the ER.  At this time my diagnosis is failure of outpatient treatment of pneumonia with objective evidence of progression of consolidation in spite of patient having been prescribed levofloxacin which patient has apparently taken completely.  At this time I will continue Zosyn+vanco as well as add azithromycin for treatment.  Follow-up blood cultures as ordered.  I will get a baseline chest x-ray in case we plan to follow-up patient's evaluation with chest x-ray. It is noted that patient disease has radiologically progressed within about 10 days. Therefore, she would be a good candidate for early repeat imaging in 10-11 days t.r.o continued occult progression  of disease. I will get  CT abd pelvis as well.       Advance Care Planning:   Code Status: Full Code   Consults: none requested by the author at this time.  Family Communication: per patient.  Severity of Illness: The appropriate patient status for this patient is INPATIENT. Inpatient status is judged to be reasonable and necessary in order to provide the required intensity of service to ensure the patient's safety. The patient's presenting symptoms, physical exam findings, and initial radiographic and laboratory data in the context of their chronic comorbidities is felt to place them at high risk for further clinical deterioration. Furthermore, it is not anticipated that the patient will be medically stable for discharge from the hospital within 2 midnights of admission.   * I certify that at the point of admission it is my clinical judgment that the patient will require inpatient hospital care spanning beyond 2 midnights from the point of admission due to high intensity of service, high risk for further deterioration and high frequency of surveillance required.*  Author: Nolberto Hanlon, MD 03/08/2023 8:36 PM  For on call review [email or web address].

## 2023-03-08 NOTE — Care Plan (Signed)
Plan of Care Note for accepted transfer   Patient: Joan [name] MRN: [id number]   DOA: 03/08/2023  Facility requesting transfer: [facility] Requesting Provider: Renne Crigler Reason for transfer: Multifocal pneumonia that has failed outpatient treatment Facility course:  Patient seen at [facility] ED, sent down from PCPs office as noted to have failed outpatient treatment for pneumonia. -Patient noted to have had a cough for 3 to 4 weeks chest x-ray done on 02/18/2023 and patient treated empirically with doxycycline. -Patient seen by PCPs office had a CT scan done on 02/25/2023 that showed new airspace disease in the right upper lobe and right middle lobe, patient subsequently transition to Levaquin completed course of treatment about a week ago. -Presenting with a 3 to 4-day history of subjective fevers, chills, generalized weakness, nausea vomiting and cough.  CT angiogram chest done in the ED negative for PE however did show multifocal pneumonia with a right middle lobe infiltrate noted as well. -Patient noted to have soft/low blood pressure initially on presentation with systolic in the 80s with subsequent systolic blood pressures in the low 100s. -Blood cultures obtained.  Patient given IV Zosyn.  Hospitalist called for admission.  Plan of care: The patient is accepted for admission to [facility] unit, at [facility]..   Author: Ramiro Harvest, MD 03/08/2023  Check [email or web address] for on-call coverage.  Nursing staff, Please call [facility] Admits & Consults System-Wide number on Amion as soon as patient's arrival, so appropriate admitting provider can evaluate the pt.

## 2023-03-08 NOTE — Telephone Encounter (Signed)
Pt's son called asking to speak with Joan [name] again. He wants to discuss her possible diabetes diagnosis. She started taking metformin but it made hr sick so she has not been taking medication. Please call him and advise.

## 2023-03-08 NOTE — Telephone Encounter (Signed)
Pt said she was diagnosed with pneumonia and it is worse. She said for the last 3 days, she has had chills/fever and has an upset stomach. Pt decided to go to ER since Dr. Laury Axon doesn't have openings

## 2023-03-08 NOTE — ED Notes (Signed)
[facility] called for transport @ 17:28  No ETA currently.Marland KitchenMarland Kitchen

## 2023-03-08 NOTE — Progress Notes (Signed)
Established patient visit   Patient: Joan [name]   DOB: [birth year]   77 y.o. Female  MRN: [id number] Visit Date: 03/08/2023  Today's healthcare provider: Alfredia Ferguson, PA-C   Cc. Pneumonia symptoms  Subjective    HPI  Pt was seen in office 8/12 for cough, prescribed doxycycline, and an xray was ordered.   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  CT 8/19 demonstrated New airspace disease in the right upper lobe with persistent airspace disease in the right middle lobe . Pt followed up via mychart with pulm, who agreed this right upper lobe developed was consistent w/ pneumonia.   Today pt reports feeling very weak, cannot hold her head up. She reports not finishing the doxycycline but was later prescribed levaquin which she finished. Reports vomiting up all food and liquids, unable to keep water down.   Medications: Outpatient Medications Prior to Visit  Medication Sig   Acetaminophen (TYLENOL 8 HOUR ARTHRITIS PAIN PO) Take by mouth.   albuterol (VENTOLIN HFA) 108 (90 Base) MCG/ACT inhaler Inhale 2 puffs into the lungs every 6 (six) hours as needed for wheezing or shortness of breath.   bisacodyl (DULCOLAX) 5 MG EC tablet Take 5-10 mg by mouth daily as needed for mild constipation.  (Patient not taking: Reported on 03/01/2023)   carisoprodol (SOMA) 350 MG tablet Take 175 mg by mouth 3 (three) times daily.    diclofenac Sodium (VOLTAREN) 1 % GEL as needed.   dorzolamide-timolol (COSOPT) 22.3-6.8 MG/ML ophthalmic solution Place 1 drop into the left eye 2 (two) times daily.   estradiol (ESTRACE) 0.5 MG tablet Take 0.5 mg by mouth at bedtime.    fluticasone (FLONASE) 50 MCG/ACT nasal spray Use 2 spray(s) in each nostril once daily   furosemide (LASIX) 40 MG tablet Take 2 tablets (80 mg total) by mouth daily.   ipratropium (ATROVENT) 0.06 % nasal spray Place 2 sprays  into both nostrils 2 (two) times daily as needed for rhinitis.   levothyroxine (SYNTHROID) 88 MCG tablet Take 1 tablet (88 mcg total) by mouth daily before breakfast.   metFORMIN (GLUCOPHAGE) 500 MG tablet Take 1 tablet (500 mg total) by mouth 2 (two) times daily with a meal. (Patient not taking: Reported on 03/01/2023)   NONFORMULARY OR COMPOUNDED ITEM Compression socks  20-30 mm/ hg   Dx low ext edema   omeprazole (PRILOSEC) 20 MG capsule Take 2 capsules by mouth once daily   oxyCODONE-acetaminophen (PERCOCET) 10-325 MG tablet Take 1 tablet by mouth every 4 (four) hours as needed for pain (fill 01/15/23)   oxyCODONE-acetaminophen (PERCOCET) 10-325 MG tablet Take 1 tablet by mouth every 4 (four) hours as needed for pain   [START ON 03/22/2023] oxyCODONE-acetaminophen (PERCOCET) 10-325 MG tablet Take 1 tablet by mouth every 4 (four) hours as needed for pain   potassium chloride SA (KLOR-CON M20) 20 MEQ tablet TAKE 4 TABLETS BY MOUTH DAILY   Probiotic Product (PROBIOTIC ACIDOPHILUS BEADS PO) Take 1 tablet by mouth daily.   simvastatin (ZOCOR) 40 MG tablet Take 1 tablet (40 mg total) by mouth at bedtime.   spironolactone (ALDACTONE) 25 MG tablet Take 1 tablet (25 mg total) by mouth 2 (two) times daily. (Patient taking differently: Take 25 mg by mouth 2 (two) times daily. One each morning and one in after)   traZODone (DESYREL) 50 MG tablet TAKE 1 TABLET BY MOUTH AT BEDTIME  TRELEGY ELLIPTA 100-62.5-25 MCG/ACT AEPB Inhale 1 puff into the lungs daily.   triamcinolone cream (KENALOG) 0.1 % Apply topically.   No facility-administered medications prior to visit.    Review of Systems  Constitutional:  Positive for fatigue. Negative for fever.  Respiratory:  Positive for cough and shortness of breath.   Cardiovascular:  Negative for chest pain and leg swelling.  Gastrointestinal:  Negative for abdominal pain.  Neurological:  Positive for dizziness and weakness. Negative for headaches.      Objective     BP 113/68   Pulse (!) 109   Temp 98.3 F (36.8 C)   Ht 5' (1.524 m)   Wt 165 lb 6.4 oz (75 kg)   SpO2 95%   BMI 32.30 kg/m   Physical Exam Constitutional:      General: She is awake.     Appearance: She is well-developed. She is ill-appearing.  HENT:     Head: Normocephalic.  Eyes:     Conjunctiva/sclera: Conjunctivae normal.  Cardiovascular:     Rate and Rhythm: Normal rate and regular rhythm.     Heart sounds: Normal heart sounds.  Pulmonary:     Effort: Pulmonary effort is normal.     Breath sounds: Decreased air movement present. Decreased breath sounds present.  Skin:    General: Skin is warm.  Neurological:     Mental Status: She is alert and oriented to person, place, and time.  Psychiatric:        Attention and Perception: Attention normal.        Mood and Affect: Mood normal.        Speech: Speech normal.        Behavior: Behavior is cooperative.     No results found for any visits on 03/08/23.  Assessment & Plan     1. Community acquired pneumonia of right upper lobe of lung Referring patient to ED given likelihood of dehydration, O2 95% RA, pneumonia not resolving outpatient. She agrees with disposition, patient was brought downstairs to the emergency room.  I, Alfredia Ferguson, PA-C have reviewed all documentation for this visit. The documentation on  03/08/23   for the exam, diagnosis, procedures, and orders are all accurate and complete.    Alfredia Ferguson, PA-C  [facility] [phone] (phone) [phone] (fax)  [facility]

## 2023-03-08 NOTE — Assessment & Plan Note (Addendum)
Initially diagnosed 02/18/2023 (CXR non diagnostic) s.p. 3 days of doxy that patient could not tolerate due to vomiting. Changed to levofloxacin. With CT 02/25/2023:New airspace disease in the right upper lobe with persistent airspace disease in the right middle lobe.  Patient now admitted to [facility] with complaints of persistent fatigue/sensation of shortness of breath/coughing.  And subjective fevers.  Patient has not checked her temperature at home.  CAT scan today on March 08, 2023 demonstrated progression of right middle lobe opacification compared to the CAT scan done on February 25, 2023.  Patient BP was soft in ER with good respnse to fluids. is appearing nontoxic no white count no fevers here.  Not hypoxic.  Patient is s/p blood cultures.  Patient has received single dose of Zosyn+vanco in the ER.  At this time my diagnosis is failure of outpatient treatment of pneumonia with objective evidence of progression of consolidation in spite of patient having been prescribed levofloxacin which patient has apparently taken completely.  At this time I will continue Zosyn+vanco as well as add azithromycin for treatment.  Follow-up blood cultures as ordered.  I will get a baseline chest x-ray in case we plan to follow-up patient's evaluation with chest x-ray. It is noted that patient disease has radiologically progressed within about 10 days. Therefore, she would be a good candidate for early repeat imaging in 10-11 days t.r.o continued occult progression of disease. I will get CT abd pelvis as well.

## 2023-03-08 NOTE — Telephone Encounter (Signed)
Noted. FYI 

## 2023-03-09 ENCOUNTER — Inpatient Hospital Stay (HOSPITAL_COMMUNITY): Payer: Medicare Other

## 2023-03-09 DIAGNOSIS — I890 Lymphedema, not elsewhere classified: Secondary | ICD-10-CM

## 2023-03-09 DIAGNOSIS — E039 Hypothyroidism, unspecified: Secondary | ICD-10-CM | POA: Diagnosis not present

## 2023-03-09 DIAGNOSIS — J189 Pneumonia, unspecified organism: Secondary | ICD-10-CM | POA: Diagnosis not present

## 2023-03-09 DIAGNOSIS — F17210 Nicotine dependence, cigarettes, uncomplicated: Secondary | ICD-10-CM | POA: Diagnosis not present

## 2023-03-09 DIAGNOSIS — F17200 Nicotine dependence, unspecified, uncomplicated: Secondary | ICD-10-CM | POA: Diagnosis not present

## 2023-03-09 LAB — BASIC METABOLIC PANEL
Anion gap: 11 (ref 5–15)
BUN: 10 mg/dL (ref 8–23)
CO2: 21 mmol/L — ABNORMAL LOW (ref 22–32)
Calcium: 7.7 mg/dL — ABNORMAL LOW (ref 8.9–10.3)
Chloride: 100 mmol/L (ref 98–111)
Creatinine, Ser: 0.69 mg/dL (ref 0.44–1.00)
GFR, Estimated: >60 mL/min (ref >60)
Glucose, Bld: 117 mg/dL — ABNORMAL HIGH (ref 70–99)
Potassium: 3.7 mmol/L (ref 3.5–5.1)
Sodium: 132 mmol/L — ABNORMAL LOW (ref 135–145)

## 2023-03-09 LAB — CBC
HCT: 35.3 % — ABNORMAL LOW (ref 36.0–46.0)
Hemoglobin: 11.6 g/dL — ABNORMAL LOW (ref 12.0–15.0)
MCH: 30.4 pg (ref 26.0–34.0)
MCHC: 32.9 g/dL (ref 30.0–36.0)
MCV: 92.4 fL (ref 80.0–100.0)
Platelets: 189 10*3/uL (ref 150–400)
RBC: 3.82 MIL/uL — ABNORMAL LOW (ref 3.87–5.11)
RDW: 13.4 % (ref 11.5–15.5)
WBC: 7.9 10*3/uL (ref 4.0–10.5)
nRBC: 0 % (ref 0.0–0.2)

## 2023-03-09 LAB — PROTIME-INR
INR: 1.1 (ref 0.8–1.2)
Prothrombin Time: 14.3 seconds (ref 11.4–15.2)

## 2023-03-09 LAB — GLUCOSE, CAPILLARY
Glucose-Capillary: 144 mg/dL — ABNORMAL HIGH (ref 70–99)
Glucose-Capillary: 131 mg/dL — ABNORMAL HIGH (ref 70–99)
Glucose-Capillary: 125 mg/dL — ABNORMAL HIGH (ref 70–99)
Glucose-Capillary: 89 mg/dL (ref 70–99)

## 2023-03-09 LAB — TSH: TSH: 0.343 u[IU]/mL — ABNORMAL LOW (ref 0.350–4.500)

## 2023-03-09 LAB — CORTISOL: Cortisol, Plasma: 23 ug/dL

## 2023-03-09 LAB — APTT: aPTT: 34 s (ref 24–36)

## 2023-03-09 MED ORDER — VANCOMYCIN HCL 750 MG/150ML IV SOLN
750.0000 mg | INTRAVENOUS | Status: DC
  Administered 2023-03-09: 750 mg via INTRAVENOUS
  Filled 2023-03-09: qty 150

## 2023-03-09 MED ORDER — ALBUTEROL SULFATE (2.5 MG/3ML) 0.083% IN NEBU
2.5000 mg | INHALATION_SOLUTION | Freq: Three times a day (TID) | RESPIRATORY_TRACT | Status: DC
  Filled 2023-03-09: qty 3

## 2023-03-09 MED ORDER — UMECLIDINIUM BROMIDE 62.5 MCG/ACT IN AEPB
1.0000 | INHALATION_SPRAY | Freq: Every day | RESPIRATORY_TRACT | Status: DC
  Filled 2023-03-09 (×2): qty 7

## 2023-03-09 MED ORDER — POTASSIUM CHLORIDE CRYS ER 20 MEQ PO TBCR
80.0000 meq | EXTENDED_RELEASE_TABLET | Freq: Every day | ORAL | Status: DC
  Administered 2023-03-10 – 2023-03-11 (×2): 80 meq via ORAL
  Filled 2023-03-09 (×2): qty 4

## 2023-03-09 MED ORDER — PANTOPRAZOLE SODIUM 40 MG PO TBEC
40.0000 mg | DELAYED_RELEASE_TABLET | Freq: Every day | ORAL | Status: DC
  Administered 2023-03-10 – 2023-03-15 (×6): 40 mg via ORAL
  Filled 2023-03-09 (×6): qty 1

## 2023-03-09 MED ORDER — IOHEXOL 9 MG/ML PO SOLN: 500.0000 mL | ORAL | Status: AC

## 2023-03-09 MED ORDER — LORATADINE 10 MG PO TABS
10.0000 mg | ORAL_TABLET | Freq: Every day | ORAL | Status: DC
  Administered 2023-03-10 – 2023-03-11 (×2): 10 mg via ORAL
  Filled 2023-03-09 (×2): qty 1

## 2023-03-09 MED ORDER — SPIRONOLACTONE 25 MG PO TABS: 25.0000 mg | ORAL_TABLET | Freq: Two times a day (BID) | ORAL | Status: DC

## 2023-03-09 MED ORDER — PROCHLORPERAZINE EDISYLATE 10 MG/2ML IJ SOLN
5.0000 mg | Freq: Four times a day (QID) | INTRAMUSCULAR | Status: DC | PRN
  Administered 2023-03-09 – 2023-03-11 (×5): 5 mg via INTRAVENOUS
  Filled 2023-03-09 (×3): qty 2
  Filled 2023-03-09: qty 1
  Filled 2023-03-09: qty 2

## 2023-03-09 MED ORDER — ONDANSETRON HCL 4 MG/2ML IJ SOLN
4.0000 mg | Freq: Four times a day (QID) | INTRAMUSCULAR | Status: DC | PRN
  Administered 2023-03-09: 4 mg via INTRAVENOUS
  Filled 2023-03-09: qty 2

## 2023-03-09 MED ORDER — GUAIFENESIN ER 600 MG PO TB12
1200.0000 mg | ORAL_TABLET | Freq: Two times a day (BID) | ORAL | Status: DC
  Administered 2023-03-10 – 2023-03-15 (×11): 1200 mg via ORAL
  Filled 2023-03-09 (×11): qty 2

## 2023-03-09 MED ORDER — FLUTICASONE FUROATE-VILANTEROL 100-25 MCG/ACT IN AEPB
1.0000 | INHALATION_SPRAY | Freq: Every day | RESPIRATORY_TRACT | Status: DC
  Filled 2023-03-09 (×2): qty 28

## 2023-03-09 MED ORDER — FLUTICASONE PROPIONATE 50 MCG/ACT NA SUSP
2.0000 | Freq: Every day | NASAL | Status: DC
  Administered 2023-03-15: 2 via NASAL
  Filled 2023-03-09 (×2): qty 16

## 2023-03-09 MED ORDER — ESTRADIOL 0.5 MG PO TABS
0.5000 mg | ORAL_TABLET | Freq: Every day | ORAL | Status: DC
  Administered 2023-03-09 – 2023-03-14 (×6): 0.5 mg via ORAL
  Filled 2023-03-09 (×6): qty 1

## 2023-03-09 MED ORDER — DICLOFENAC SODIUM 1 % EX GEL: 2.0000 g | CUTANEOUS | Status: DC | PRN

## 2023-03-09 MED ORDER — FUROSEMIDE 40 MG PO TABS: 80.0000 mg | ORAL_TABLET | Freq: Every day | ORAL | Status: DC

## 2023-03-09 NOTE — Progress Notes (Signed)
PROGRESS NOTE    Joan [name]  XBJ:[id number] DOB: March 14, 1946 DOA: 03/08/2023 PCP: Donato Schultz, DO    Chief Complaint  Patient presents with   Weakness   Fever   Cough    Brief Narrative:  HPI per Dr.Goel Joan [name] is a 77 y.o. female with medical history significant of COPD as noted in chart (but denied by patient).   Patient was in her usual state of health till about 02/18/2023 when she had a new onset cough. Patient doe snot report any expectoration.    Per record review from PCP:   seen in office 8/12 for cough, prescribed doxycycline, and an xray was ordered.    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   CT 8/19 demonstrated New airspace disease in the right upper lobe with persistent airspace disease in the right middle lobe . Pt followed up via mychart with pulm, who agreed this right upper lobe developed was consistent w/ pneumonia.    Per report from patient she was started on doxycycline - took it for three days but was vomiting- changed to levofloxacin - took all of it. Finished it "a few days" ago.    Patient came back to the primary care office earlier today complaining of ongoing extreme weakness, sensation of shortness of breath as well as reportedly vomiting at home.  However to me she only reports her extreme weakness as being the problem she denies any chest pain except when she coughs and denies any shortness of breath.  She reports subjective fevers feeling extremely cold but has not taken her temperature at home.  There is no report of any leg swelling or diarrhea or any ongoing vomiting.   [facility] care provider transferred the patient to the ER for failure of outpatient antibiotic therapy, patient subsequently received Zosyn and has been transferred to the stepdown unit at [facility].  Medical evaluation is sought.   Patient reports  no specific complaints at the time of this encounter.    Assessment & Plan:   Principal Problem:   Multifocal pneumonia Active Problems:   Hypothyroidism   TOBACCO ABUSE   GERD   Lower extremity edema   Cigarette smoker   Pneumonia   Lymphedema  #1 multifocal pneumonia -Patient noted to have failed outpatient therapy. -Patient initially diagnosed 02/18/2023 received 3 days of doxycycline which she could not tolerate and antibiotics changed to Levaquin which she completed in the outpatient setting. -On follow-up CT 02/25/2023 with new airspace disease noted in the right upper lobe with persistent airspace disease in the right middle lobe. -Patient presented to PCPs office on day of admission with generalized weakness, fatigue, shortness of breath, productive cough, subjective fevers and chills. -CT angiogram chest done with progression of right middle lobe opacification compared to CAT scan done 02/25/2023. -Patient noted to have soft blood pressure which responded to IV fluids. -Patient pancultured, blood cultures obtained and pending. -MRSA PCR negative. -SARS coronavirus 2 PCR negative. - SLP evaluation.   -Continue empiric IV vancomycin, IV azithromycin, IV Zosyn. -Place on scheduled albuterol nebs, Flonase, Breo Ellipta, Incruse, Mucinex, Claritin, PPI. -IV antiemetics, supportive care.  2.  Hypothyroidism -Continue home regimen Synthroid.  3.  Hypertension -Blood pressure soft. -Hold spironolactone, Lasix.  4.  Tobacco abuse -Tobacco cessation.  5.  GERD -PPI.  6.  Diabetes mellitus type 2 -Hemoglobin A1c 7.1 (02/18/2023) -Hold oral hypoglycemic  agents. -SSI.  7.  Lower extremity edema/lymphedema -Patient states on Lasix and spironolactone for lymphedema. -Lower extremities wrapped. -Hold Lasix and spironolactone due to soft blood pressure.   DVT prophylaxis: Lovenox Code Status: Full Family Communication: Updated patient.  No family at bedside. Disposition:  Remain in stepdown unit through today.  Status is: Inpatient Remains inpatient appropriate because: Severity of illness   Consultants:  None  Procedures:  CT angiogram chest 03/08/2023   Antimicrobials:  Anti-infectives (From admission, onward)    Start     Dose/Rate Route Frequency Ordered Stop   03/10/23 0600  vancomycin (VANCOCIN) IVPB 1000 mg/200 mL premix        1,000 mg 200 mL/hr over 60 Minutes Intravenous Every 36 hours 03/08/23 2126     03/08/23 2200  azithromycin (ZITHROMAX) 500 mg in sodium chloride 0.9 % 250 mL IVPB        500 mg 250 mL/hr over 60 Minutes Intravenous Every 24 hours 03/08/23 2056 03/11/23 2159   03/08/23 2200  piperacillin-tazobactam (ZOSYN) IVPB 3.375 g        3.375 g 12.5 mL/hr over 240 Minutes Intravenous Every 8 hours 03/08/23 2103     03/08/23 2200  piperacillin-tazobactam (ZOSYN) IVPB 3.375 g        3.375 g 12.5 mL/hr over 240 Minutes Intravenous  Once 03/08/23 2110     03/08/23 1715  piperacillin-tazobactam (ZOSYN) IVPB 3.375 g  Status:  Discontinued        3.375 g 12.5 mL/hr over 240 Minutes Intravenous  Once 03/08/23 1714 03/08/23 2200   03/08/23 1715  vancomycin (VANCOCIN) IVPB 1000 mg/200 mL premix  Status:  Discontinued        1,000 mg 200 mL/hr over 60 Minutes Intravenous  Once 03/08/23 1714 03/08/23 2001         Subjective: Patient laying in bed.  Complaining of nausea.  No emesis.  Still with some shortness of breath and productive cough of greenish-yellowish sputum per patient.  Feels slightly better than she did on admission however still does not feel well.  Feels very weak.  Objective: Vitals:   03/09/23 0700 03/09/23 0800 03/09/23 0900 03/09/23 1000  BP: (!) 104/90 (!) 113/44 (!) 123/48 (!) 137/50  Pulse: 77 73 71 71  Resp: 17 18 15 13   Temp:      TempSrc:      SpO2: 91% 93% 94% 94%  Weight:      Height:        Intake/Output Summary (Last 24 hours) at 03/09/2023 1029 Last data filed at 03/09/2023 1012 Gross per 24  hour  Intake 348.84 ml  Output --  Net 348.84 ml   Filed Weights   03/08/23 1342 03/08/23 1918  Weight: 75 kg 75 kg    Examination:  General exam: Appears calm and comfortable  Respiratory system: Coarse rhonchorous breath sounds right greater than left.  No significant wheezing noted.  No significant crackles.  Fair air movement.   Cardiovascular system: S1 & S2 heard, RRR. No JVD, murmurs, rubs, gallops or clicks.  Bilateral lower extremities wrapped.  Gastrointestinal system: Abdomen is nondistended, soft and nontender. No organomegaly or masses felt. Normal bowel sounds heard. Central nervous system: Alert and oriented. No focal neurological deficits. Extremities: Bilateral lower extremities wrapped.  Symmetric 5 x 5 power. Skin: No rashes, lesions or ulcers Psychiatry: Judgement and insight appear normal. Mood & affect appropriate.     Data Reviewed: I have personally reviewed following labs and imaging studies  CBC: Recent Labs  Lab 03/08/23 1411 03/09/23 0311  WBC 10.3 7.9  NEUTROABS 7.2  --   HGB 12.6 11.6*  HCT 37.7 35.3*  MCV 89.5 92.4  PLT 203 189    Basic Metabolic Panel: Recent Labs  Lab 03/08/23 1411 03/09/23 0311  NA 135 132*  K 3.8 3.7  CL 100 100  CO2 23 21*  GLUCOSE 157* 117*  BUN 14 10  CREATININE 0.97 0.69  CALCIUM 8.4* 7.7*    GFR: Estimated Creatinine Clearance: 52.4 mL/min (by C-G formula based on SCr of 0.69 mg/dL).  Liver Function Tests: Recent Labs  Lab 03/08/23 1411  AST 23  ALT 13  ALKPHOS 97  BILITOT 0.4  PROT 6.8  ALBUMIN 3.4*    CBG: Recent Labs  Lab 03/08/23 1401 03/08/23 2117 03/09/23 0810  GLUCAP 159* 130* 144*     Recent Results (from the past 240 hour(s))  SARS Coronavirus 2 by RT PCR (hospital order, performed in [facility] hospital lab) *cepheid single result test* Urine, Clean Catch     Status: None   Collection Time: 03/08/23  1:46 PM   Specimen: Urine, Clean Catch; Nasal Swab  Result Value Ref  Range Status   SARS Coronavirus 2 by RT PCR NEGATIVE NEGATIVE Final    Comment: (NOTE) SARS-CoV-2 target nucleic acids are NOT DETECTED.  The SARS-CoV-2 RNA is generally detectable in upper and lower respiratory specimens during the acute phase of infection. The lowest concentration of SARS-CoV-2 viral copies this assay can detect is 250 copies / mL. A negative result does not preclude SARS-CoV-2 infection and should not be used as the sole basis for treatment or other patient management decisions.  A negative result may occur with improper specimen collection / handling, submission of specimen other than nasopharyngeal swab, presence of viral mutation(s) within the areas targeted by this assay, and inadequate number of viral copies (<250 copies / mL). A negative result must be combined with clinical observations, patient history, and epidemiological information.  Fact Sheet for Patients:   RoadLapTop.co.za  Fact Sheet for Healthcare Providers: http://kim-miller.com/  This test is not yet approved or  cleared by the [place] FDA and has been authorized for detection and/or diagnosis of SARS-CoV-2 by FDA under an Emergency Use Authorization (EUA).  This EUA will remain in effect (meaning this test can be used) for the duration of the COVID-19 declaration under Section 564(b)(1) of the Act, 21 U.S.C. section 360bbb-3(b)(1), unless the authorization is terminated or revoked sooner.  Performed at [facility], [address] [place], [place] [address]   Blood culture (routine x 2)     Status: None (Preliminary result)   Collection Time: 03/08/23  2:18 PM   Specimen: BLOOD  Result Value Ref Range Status   Specimen Description   Final    BLOOD RIGHT ANTECUBITAL Performed at [facility], [address] [place], [place] [address]    Special Requests   Final    BOTTLES DRAWN AEROBIC AND ANAEROBIC Blood Culture  adequate volume Performed at [facility], [address] [place], [place] [address]    Culture   Final    NO GROWTH < 24 HOURS Performed at [facility], [address], [place], [place] [address]    Report Status PENDING  Incomplete  Blood culture (routine x 2)     Status: None (Preliminary result)   Collection Time: 03/08/23  2:28 PM  Specimen: BLOOD  Result Value Ref Range Status   Specimen Description   Final    BLOOD BLOOD RIGHT HAND Performed at [facility], [address] [place], [place] [address]    Special Requests   Final    BOTTLES DRAWN AEROBIC AND ANAEROBIC Blood Culture results may not be optimal due to an inadequate volume of blood received in culture bottles Performed at [facility], [address] [place], [place] [address]    Culture   Final    NO GROWTH < 24 HOURS Performed at [facility], [address] [place], [place] [address]    Report Status PENDING  Incomplete  MRSA Next Gen by PCR, Nasal     Status: None   Collection Time: 03/08/23  7:36 PM   Specimen: Nasal Mucosa; Nasal Swab  Result Value Ref Range Status   MRSA by PCR Next Gen NOT DETECTED NOT DETECTED Final    Comment: (NOTE) The GeneXpert MRSA Assay (FDA approved for NASAL specimens only), is one component of a comprehensive MRSA colonization surveillance program. It is not intended to diagnose MRSA infection nor to guide or monitor treatment for MRSA infections. Test performance is not FDA approved in patients less than 23 years old. Performed at [facility], [address], [place], [place] [address]          Radiology Studies: CT Angio Chest PE W and/or Wo Contrast  Result Date: 03/08/2023 CLINICAL DATA:  Pulmonary embolism (PE) suspected, high prob. Cough. Fever with chills. Increased weakness. EXAM: CT ANGIOGRAPHY CHEST WITH CONTRAST TECHNIQUE: Multidetector CT imaging of the chest was performed using the  standard protocol during bolus administration of intravenous contrast. Multiplanar CT image reconstructions and MIPs were obtained to evaluate the vascular anatomy. RADIATION DOSE REDUCTION: This exam was performed according to the departmental dose-optimization program which includes automated exposure control, adjustment of the mA and/or kV according to patient size and/or use of iterative reconstruction technique. CONTRAST:  75mL OMNIPAQUE IOHEXOL 350 MG/ML SOLN COMPARISON:  CT scan chest from 02/25/2023. FINDINGS: Cardiovascular: No evidence of embolism to the proximal subsegmental pulmonary artery level. Normal cardiac size. No pericardial effusion. No aortic aneurysm. Mediastinum/Nodes: Visualized thyroid gland appears grossly unremarkable. No solid / cystic mediastinal masses. The esophagus is nondistended precluding optimal assessment. No axillary, mediastinal or hilar lymphadenopathy by size criteria. Lungs/Pleura: The central tracheo-bronchial tree is patent. Minimal upper lobe predominant centrilobular emphysema noted. There is consolidation in the medial segment of the middle lobe with air bronchogram, compatible with pneumonia. There are additional patchy opacities throughout bilateral lungs favored to represent pneumonia as well there are innumerable subcentimeter sized noncalcified nodules throughout bilateral lungs which are nonspecific but favored to represent endobronchial spread of infection as well. No pleural effusion or pneumothorax. Upper Abdomen: Evaluation of upper abdominal viscera is markedly limited due to streak artifacts from spinal fixation hardware. Visualized upper abdominal viscera are grossly within normal limits. Musculoskeletal: The visualized soft tissues of the chest wall are grossly unremarkable. No suspicious osseous lesions. There are mild multilevel degenerative changes in the visualized spine. Review of the MIP images confirms the above findings. IMPRESSION: 1. No evidence  of pulmonary embolism to the proximal subsegmental pulmonary artery level. 2. Multifocal pneumonia with consolidation in the medial segment of the middle lobe. 3. Innumerable subcentimeter sized noncalcified nodules throughout bilateral lungs which are nonspecific but favored to represent endobronchial spread of infection as well. Short-term follow-up examination in  3 months is recommended to document resolution. Electronically Signed   By: Jules Schick M.D.   On: 03/08/2023 16:30        Scheduled Meds:  albuterol  2.5 mg Nebulization TID   bisacodyl  5 mg Oral QHS   carisoprodol  175 mg Oral TID   Chlorhexidine Gluconate Cloth  6 each Topical Daily   dorzolamide-timolol  1 drop Left Eye BID   enoxaparin (LOVENOX) injection  40 mg Subcutaneous Q24H   estradiol  0.5 mg Oral QHS   fluticasone  2 spray Each Nare Daily   fluticasone furoate-vilanterol  1 puff Inhalation Daily   And   umeclidinium bromide  1 puff Inhalation Daily   [START ON 03/10/2023] furosemide  80 mg Oral Daily   guaiFENesin  1,200 mg Oral BID   insulin aspart  0-15 Units Subcutaneous TID WC   insulin aspart  0-5 Units Subcutaneous QHS   iohexol  500 mL Oral Q1H   levothyroxine  88 mcg Oral QAC breakfast   loratadine  10 mg Oral Daily   ondansetron  4 mg Intravenous Once   pantoprazole  40 mg Oral Daily   [START ON 03/10/2023] potassium chloride SA  80 mEq Oral Daily   simvastatin  40 mg Oral QHS   sodium chloride flush  3 mL Intravenous Q12H   [START ON 03/10/2023] spironolactone  25 mg Oral BID   traZODone  50 mg Oral QHS   Continuous Infusions:  sodium chloride     azithromycin Stopped (03/09/23 0012)   piperacillin-tazobactam (ZOSYN)  IV Stopped (03/09/23 0734)   piperacillin-tazobactam (ZOSYN)  IV Stopped (03/08/23 2128)   [START ON 03/10/2023] vancomycin       LOS: 1 day    Time spent: 40 minutes    Ramiro Harvest, MD [facility]   To contact the attending provider between 7A-7P or the  covering provider during after hours 7P-7A, please log into the web site [email or web address] and access using universal [place] password for that web site. If you do not have the password, please call the hospital operator.  03/09/2023, 10:29 AM

## 2023-03-09 NOTE — Plan of Care (Signed)

## 2023-03-09 NOTE — Evaluation (Signed)
Clinical/Bedside Swallow Evaluation Patient Details  Name: Joan [name] MRN: [id number] Date of Birth: 1946-02-09  Today's Date: 03/09/2023 Time: SLP Start Time (ACUTE ONLY): 1512 SLP Stop Time (ACUTE ONLY): 1519 SLP Time Calculation (min) (ACUTE ONLY): 7 min  Past Medical History:  Past Medical History:  Diagnosis Date   Allergy ?   Arthritis    "shoulders; wrist; probably spine" (06/24/2013)   Blood transfusion without reported diagnosis    Cancer (HCC)    skin cancer right leg x 4     Cataract    removed both eyes    Chronic low back pain    followed by Dr Vear Clock pain mgt   Colon polyp    Constipation    COPD (chronic obstructive pulmonary disease) (HCC)    mild   Esophageal stricture    Finger pain, left    2 fingers on left hand since wrist surgery   GERD (gastroesophageal reflux disease)    Glaucoma    Heart murmur    "slight; not on RX" (06/24/2013)   History of cardiac arrhythmia    cardiologist- Traci Turner   Hx of colonic polyps 08/28/2004   Hyperlipemia    Hypertension    Hypothyroidism    Osteoarthritis of left knee    advanced   Osteoporosis ?   PONV (postoperative nausea and vomiting)    Pt reports symptoms are the result of gallbladder and cholecystectomy, not anesthesia   PVC's (premature ventricular contractions)    Sleep apnea 1990's   "tested; tried mask; couldn't stand it; told me as long as I slept on my side I'd be ok" (06/24/2013)   Thyroid nodule    Tobacco abuse    Past Surgical History:  Past Surgical History:  Procedure Laterality Date   ABDOMINAL HYSTERECTOMY  1988   "partial" (06/24/2013)   ABDOMINAL HYSTERECTOMY     partial in 1988   ANKLE SURGERY Left 1995   "tendon repair" (06/24/2013)   BACK SURGERY     "think today was my 8th back OR" (06/24/2013)   CERVICAL SPINE SURGERY  2012   CESAREAN SECTION  1972   CHOLECYSTECTOMY N/A 04/16/2013   Procedure: LAPAROSCOPIC CHOLECYSTECTOMY ;  Surgeon: Wilmon Arms. Corliss Skains, MD;   Location: [facility];  Service: General;  Laterality: N/A;   COLONOSCOPY     ELBOW SURGERY  1990's   EYE SURGERY  ?   FOOT SURGERY Right 2012   SPUR REMOVED   FRACTURE SURGERY  ?   INFUSION PUMP IMPLANTATION  1990's   "implantablet morphine pump; took it out w/in 11 months   JOINT REPLACEMENT  ?   KNEE ARTHROSCOPY Left 1991; ~ 1993   LAPAROSCOPIC LYSIS OF ADHESIONS N/A 04/16/2013   Procedure: LAPAROSCOPIC LYSIS OF ADHESIONS;  Surgeon: Wilmon Arms. Corliss Skains, MD;  Location: [facility];  Service: General;  Laterality: N/A;   LUMBAR FUSION     and rods   LUMBAR LAMINECTOMY/DECOMPRESSION MICRODISCECTOMY N/A 11/28/2012   Procedure: DECOMPRESSIVE LUMBAR LAMINECTOMY LEVEL 1;  Surgeon: Mariam Dollar, MD;  Location: [facility];  Service: Neurosurgery;  Laterality: N/A;  DECOMPRESSIVE LUMBAR LAMINECTOMY LEVEL 1   ORIF DISTAL RADIUS FRACTURE Left 12/30/2013   dr Melvyn Novas   ORIF WRIST FRACTURE Left 12/30/2013   Procedure: OPEN REDUCTION INTERNAL FIXATION (ORIF) LEFT WRIST FRACTURE AND REPAIR AS INDICATED;  Surgeon: Sharma Covert, MD;  Location: [facility];  Service: Orthopedics;  Laterality: Left;   POLYPECTOMY     POSTERIOR FUSION LUMBAR SPINE  06/24/2013  ROBOTIC ASSISTED SALPINGO OOPHERECTOMY Bilateral 08/20/2014   Procedure: ROBOTIC ASSISTED SALPINGO OOPHORECTOMY;  Surgeon: Loney Laurence, MD;  Location: [facility];  Service: Gynecology;  Laterality: Bilateral;   SHOULDER ARTHROSCOPY Left 09/2011   SPINE SURGERY  8 times   THYROIDECTOMY  2012   TOTAL HIP ARTHROPLASTY Left 04/27/2019   Procedure: TOTAL HIP ARTHROPLASTY ANTERIOR APPROACH;  Surgeon: Ollen Gross, MD;  Location: [facility];  Service: Orthopedics;  Laterality: Left;    TOTAL HIP ARTHROPLASTY Right 09/09/2019   Procedure: TOTAL HIP ARTHROPLASTY ANTERIOR APPROACH;  Surgeon: Ollen Gross, MD;  Location: [facility];  Service: Orthopedics;  Laterality: Right;    TOTAL KNEE ARTHROPLASTY Right 09/09/2017   Procedure: RIGHT TOTAL KNEE  ARTHROPLASTY;  Surgeon: Ollen Gross, MD;  Location: [facility];  Service: Orthopedics;  Laterality: Right;   TUBAL LIGATION  ?   UPPER GASTROINTESTINAL ENDOSCOPY     VITRECTOMY Right 10/18/2020   HPI:  Joan [name] is a 77 y.o. female with medical history significant of COPD as noted in chart admitted with new onset cough. Pt had CT CT 8/19 demonstrated New airspace disease in the right upper lobe with persistent airspace disease in the right middle lobe. Pt came back to the primary care office earlier today complaining of ongoing extreme weakness, sensation of shortness of breath as well as reportedly vomiting at home and pt transferred to ER. Diagnosed with multifocal pna.    Assessment / Plan / Recommendation  Clinical Impression  Pt alert, conversant and denies dysphagia. Her volitional cough is strong, adequate dentition and oromotor exam unremarkable. Pt's swallow appeared timely without residue or s/s aspiration across textures. She consumed consecutive sips thin via straw without change in respirations or indications of airway compromise. Recommend she continue with regular texture diet, thin liquids, pills with water and no further ST needed. SLP Visit Diagnosis: Dysphagia, unspecified (R13.10)    Aspiration Risk  No limitations    Diet Recommendation Regular;Thin liquid    Liquid Administration via: Straw;Cup Medication Administration: Whole meds with liquid Supervision: Patient able to self feed Postural Changes: Seated upright at 90 degrees    Other  Recommendations Oral Care Recommendations: Oral care BID    Recommendations for follow up therapy are one component of a multi-disciplinary discharge planning process, led by the attending physician.  Recommendations may be updated based on patient status, additional functional criteria and insurance authorization.  Follow up Recommendations No SLP follow up      Assistance Recommended at Discharge    Functional Status  Assessment Patient has not had a recent decline in their functional status  Frequency and Duration            Prognosis        Swallow Study   General Date of Onset: 03/09/23 HPI: Joan [name] is a 77 y.o. female with medical history significant of COPD as noted in chart admitted with new onset cough. Pt had CT CT 8/19 demonstrated New airspace disease in the right upper lobe with persistent airspace disease in the right middle lobe. Pt came back to the primary care office earlier today complaining of ongoing extreme weakness, sensation of shortness of breath as well as reportedly vomiting at home and pt transferred to ER. Diagnosed with multifocal pna. Type of Study: Bedside Swallow Evaluation Previous Swallow Assessment:  (none) Diet Prior to this Study: Regular;Thin liquids (Level 0) Temperature Spikes Noted: No Respiratory Status: Room air History of Recent Intubation: No Behavior/Cognition: Alert;Cooperative;Pleasant mood  Oral Cavity Assessment: Within Functional Limits Oral Care Completed by SLP: No Oral Cavity - Dentition: Adequate natural dentition (missing several posterior) Vision: Functional for self-feeding Self-Feeding Abilities: Able to feed self Patient Positioning: Upright in bed Baseline Vocal Quality: Normal Volitional Cough: Strong Volitional Swallow: Able to elicit    Oral/Motor/Sensory Function Overall Oral Motor/Sensory Function: Within functional limits   Ice Chips Ice chips: Not tested   Thin Liquid Thin Liquid: Within functional limits Presentation: Cup;Straw    Nectar Thick Nectar Thick Liquid: Not tested   Honey Thick Honey Thick Liquid: Not tested   Puree Puree: Not tested   Solid     Solid: Within functional limits      Royce Macadamia 03/09/2023,3:27 PM

## 2023-03-09 NOTE — Progress Notes (Signed)
PHARMACY NOTE:  ANTIMICROBIAL RENAL DOSAGE ADJUSTMENT  Current antimicrobial regimen includes a mismatch between antimicrobial dosage and estimated renal function.  As per policy approved by the Pharmacy & Therapeutics and Medical Executive Committees, the antimicrobial dosage will be adjusted accordingly.  Current antimicrobial dosage:  vancomycin 1000 mg IV q36h  Indication: pneumonia  Renal Function:  Estimated Creatinine Clearance: 52.4 mL/min (by C-G formula based on SCr of 0.69 mg/dL).     Antimicrobial dosage has been changed to: vancomycin 750 mg IV q24h   Thank you for allowing pharmacy to be a part of this patient's care.  Pricilla Riffle, PharmD, BCPS Clinical Pharmacist 03/09/2023 8:23 AM

## 2023-03-10 DIAGNOSIS — E039 Hypothyroidism, unspecified: Secondary | ICD-10-CM | POA: Diagnosis not present

## 2023-03-10 DIAGNOSIS — F17210 Nicotine dependence, cigarettes, uncomplicated: Secondary | ICD-10-CM | POA: Diagnosis not present

## 2023-03-10 DIAGNOSIS — F17200 Nicotine dependence, unspecified, uncomplicated: Secondary | ICD-10-CM | POA: Diagnosis not present

## 2023-03-10 DIAGNOSIS — J189 Pneumonia, unspecified organism: Secondary | ICD-10-CM | POA: Diagnosis not present

## 2023-03-10 LAB — CBC WITH DIFFERENTIAL/PLATELET
Abs Immature Granulocytes: 0.12 10*3/uL — ABNORMAL HIGH (ref 0.00–0.07)
Basophils Absolute: 0 10*3/uL (ref 0.0–0.1)
Basophils Relative: 1 %
Eosinophils Absolute: 0.1 10*3/uL (ref 0.0–0.5)
Eosinophils Relative: 1 %
HCT: 33.2 % — ABNORMAL LOW (ref 36.0–46.0)
Hemoglobin: 10.8 g/dL — ABNORMAL LOW (ref 12.0–15.0)
Immature Granulocytes: 2 %
Lymphocytes Relative: 19 %
Lymphs Abs: 1.2 10*3/uL (ref 0.7–4.0)
MCH: 29.8 pg (ref 26.0–34.0)
MCHC: 32.5 g/dL (ref 30.0–36.0)
MCV: 91.7 fL (ref 80.0–100.0)
Monocytes Absolute: 1.3 10*3/uL — ABNORMAL HIGH (ref 0.1–1.0)
Monocytes Relative: 20 %
Neutro Abs: 3.6 10*3/uL (ref 1.7–7.7)
Neutrophils Relative %: 57 %
Platelets: 186 10*3/uL (ref 150–400)
RBC: 3.62 MIL/uL — ABNORMAL LOW (ref 3.87–5.11)
RDW: 13.3 % (ref 11.5–15.5)
WBC: 6.4 10*3/uL (ref 4.0–10.5)
nRBC: 0 % (ref 0.0–0.2)

## 2023-03-10 LAB — GLUCOSE, CAPILLARY
Glucose-Capillary: 129 mg/dL — ABNORMAL HIGH (ref 70–99)
Glucose-Capillary: 138 mg/dL — ABNORMAL HIGH (ref 70–99)
Glucose-Capillary: 133 mg/dL — ABNORMAL HIGH (ref 70–99)
Glucose-Capillary: 137 mg/dL — ABNORMAL HIGH (ref 70–99)

## 2023-03-10 LAB — BASIC METABOLIC PANEL
Anion gap: 9 (ref 5–15)
BUN: 10 mg/dL (ref 8–23)
CO2: 23 mmol/L (ref 22–32)
Calcium: 7.6 mg/dL — ABNORMAL LOW (ref 8.9–10.3)
Chloride: 101 mmol/L (ref 98–111)
Creatinine, Ser: 0.7 mg/dL (ref 0.44–1.00)
GFR, Estimated: >60 mL/min (ref >60)
Glucose, Bld: 110 mg/dL — ABNORMAL HIGH (ref 70–99)
Potassium: 3.4 mmol/L — ABNORMAL LOW (ref 3.5–5.1)
Sodium: 133 mmol/L — ABNORMAL LOW (ref 135–145)

## 2023-03-10 LAB — MAGNESIUM: Magnesium: 2.2 mg/dL (ref 1.7–2.4)

## 2023-03-10 MED ORDER — FUROSEMIDE 40 MG PO TABS
80.0000 mg | ORAL_TABLET | Freq: Every day | ORAL | Status: DC
  Administered 2023-03-11: 80 mg via ORAL
  Filled 2023-03-10: qty 2

## 2023-03-10 MED ORDER — TRIAMCINOLONE ACETONIDE 0.1 % EX CREA
TOPICAL_CREAM | Freq: Two times a day (BID) | CUTANEOUS | Status: DC
  Filled 2023-03-10 (×3): qty 15

## 2023-03-10 MED ORDER — CARMEX CLASSIC LIP BALM EX OINT
TOPICAL_OINTMENT | CUTANEOUS | Status: DC | PRN
  Administered 2023-03-10: 1 via TOPICAL
  Filled 2023-03-10: qty 10

## 2023-03-10 MED ORDER — SPIRONOLACTONE 25 MG PO TABS
25.0000 mg | ORAL_TABLET | Freq: Two times a day (BID) | ORAL | Status: DC
  Administered 2023-03-11 (×2): 25 mg via ORAL
  Filled 2023-03-10 (×2): qty 1

## 2023-03-10 MED ORDER — ENSURE ENLIVE PO LIQD
237.0000 mL | Freq: Two times a day (BID) | ORAL | Status: DC
  Administered 2023-03-10 – 2023-03-15 (×10): 237 mL via ORAL

## 2023-03-10 NOTE — Plan of Care (Signed)

## 2023-03-10 NOTE — Progress Notes (Addendum)
PROGRESS NOTE    Joan [name]  GNF:[id number] DOB: 04-10-1946 DOA: 03/08/2023 PCP: Donato Schultz, DO    Chief Complaint  Patient presents with   Weakness   Fever   Cough    Brief Narrative:  HPI per Dr.Goel Joan [name] is a 77 y.o. female with medical history significant of COPD as noted in chart (but denied by patient).   Patient was in her usual state of health till about 02/18/2023 when she had a new onset cough. Patient doe snot report any expectoration.    Per record review from PCP:   seen in office 8/12 for cough, prescribed doxycycline, and an xray was ordered.    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   CT 8/19 demonstrated New airspace disease in the right upper lobe with persistent airspace disease in the right middle lobe . Pt followed up via mychart with pulm, who agreed this right upper lobe developed was consistent w/ pneumonia.    Per report from patient she was started on doxycycline - took it for three days but was vomiting- changed to levofloxacin - took all of it. Finished it "a few days" ago.    Patient came back to the primary care office earlier today complaining of ongoing extreme weakness, sensation of shortness of breath as well as reportedly vomiting at home.  However to me she only reports her extreme weakness as being the problem she denies any chest pain except when she coughs and denies any shortness of breath.  She reports subjective fevers feeling extremely cold but has not taken her temperature at home.  There is no report of any leg swelling or diarrhea or any ongoing vomiting.   [facility] care provider transferred the patient to the ER for failure of outpatient antibiotic therapy, patient subsequently received Zosyn and has been transferred to the stepdown unit at [facility].  Medical evaluation is sought.   Patient reports  no specific complaints at the time of this encounter.    Assessment & Plan:   Principal Problem:   Multifocal pneumonia Active Problems:   Hypothyroidism   TOBACCO ABUSE   GERD   Lower extremity edema   Cigarette smoker   Pneumonia   Lymphedema  #1 multifocal pneumonia -Patient noted to have failed outpatient therapy. -Patient initially diagnosed 02/18/2023 received 3 days of doxycycline which she could not tolerate and antibiotics changed to Levaquin which she completed in the outpatient setting. -On follow-up CT 02/25/2023 with new airspace disease noted in the right upper lobe with persistent airspace disease in the right middle lobe. -Patient presented to PCPs office on day of admission with generalized weakness, fatigue, shortness of breath, productive cough, subjective fevers and chills. -CT angiogram chest done with progression of right middle lobe opacification compared to CAT scan done 02/25/2023. -Patient noted to have soft blood pressure which responded to IV fluids. -Patient pancultured, blood cultures obtained and pending. -MRSA PCR negative. -SARS coronavirus 2 PCR negative. - SLP evaluation.   -Discontinue IV vancomycin.  -Continue azithromycin, IV Zosyn.   -Mucinex. -IV antiemetics, supportive care.  2.  Hypothyroidism -Synthroid.  3.  Hypertension -BP soft.   -Continue to hold Lasix, spironolactone.   4.  Tobacco abuse -Tobacco cessation.  5.  GERD -PPI.  6.  Diabetes mellitus type 2 -Hemoglobin A1c 7.1 (02/18/2023) -CBG 129 this morning. -SSI. -Continue to hold oral hypoglycemic agents.  7.  Lower extremity edema/lymphedema -Patient states on Lasix and spironolactone for lymphedema. -Lower extremities wrapped. -Continue to hold Lasix and spironolactone due to soft blood pressure.  8.  Hypokalemia -Replete.   DVT prophylaxis: Lovenox Code Status: Full Family Communication: Updated patient.  No family at bedside. Disposition: Transfer to  progressive care floor.   Status is: Inpatient Remains inpatient appropriate because: Severity of illness   Consultants:  None  Procedures:  CT angiogram chest 03/08/2023 Chest x-ray 03/09/2023  Antimicrobials:  Anti-infectives (From admission, onward)    Start     Dose/Rate Route Frequency Ordered Stop   03/10/23 0600  vancomycin (VANCOCIN) IVPB 1000 mg/200 mL premix  Status:  Discontinued        1,000 mg 200 mL/hr over 60 Minutes Intravenous Every 36 hours 03/08/23 2126 03/09/23 1422   03/09/23 1800  vancomycin (VANCOREADY) IVPB 750 mg/150 mL  Status:  Discontinued        750 mg 150 mL/hr over 60 Minutes Intravenous Every 24 hours 03/09/23 1422 03/10/23 0801   03/08/23 2200  azithromycin (ZITHROMAX) 500 mg in sodium chloride 0.9 % 250 mL IVPB        500 mg 250 mL/hr over 60 Minutes Intravenous Every 24 hours 03/08/23 2056 03/11/23 2159   03/08/23 2200  piperacillin-tazobactam (ZOSYN) IVPB 3.375 g        3.375 g 12.5 mL/hr over 240 Minutes Intravenous Every 8 hours 03/08/23 2103     03/08/23 2200  piperacillin-tazobactam (ZOSYN) IVPB 3.375 g        3.375 g 12.5 mL/hr over 240 Minutes Intravenous  Once 03/08/23 2110     03/08/23 1715  piperacillin-tazobactam (ZOSYN) IVPB 3.375 g  Status:  Discontinued        3.375 g 12.5 mL/hr over 240 Minutes Intravenous  Once 03/08/23 1714 03/08/23 2200   03/08/23 1715  vancomycin (VANCOCIN) IVPB 1000 mg/200 mL premix  Status:  Discontinued        1,000 mg 200 mL/hr over 60 Minutes Intravenous  Once 03/08/23 1714 03/08/23 2001         Subjective: Patient lying in bed.  Overall feeling better than she did yesterday.  States some improvement with shortness of breath.  Denies any abdominal pain.  States some improvement with cough.  Some improvement with nausea.  Denies any emesis this morning.  States was able to tolerate breakfast this morning.  Hesitant on getting CT abdomen at this point in time as she is concerned about oral contrast with  her nausea and would like to delay CT abdomen and pelvis if possible at this time.   Objective: Vitals:   03/10/23 0600 03/10/23 0700 03/10/23 0800 03/10/23 0900  BP: (!) 140/67 (!) 140/42 (!) 120/59 (!) 112/42  Pulse: 80 75 80 88  Resp: 15 13 (!) 27 (!) 21  Temp:      TempSrc:      SpO2: 94% 93% (!) 89% 92%  Weight:      Height:        Intake/Output Summary (Last 24 hours) at 03/10/2023 1003 Last data filed at 03/10/2023 0100 Gross per 24 hour  Intake 747.21 ml  Output --  Net 747.21 ml   Filed Weights   03/08/23 1342 03/08/23 1918  Weight: 75 kg 75 kg    Examination:  General exam: Appears calm and comfortable  Respiratory system: Coarse rhonchorous breath sounds right greater than left.  No wheezing.  Fair air movement.  Speaking in full sentences.  Cardiovascular system: RRR no murmurs rubs or gallops.  No JVD.  Bilateral lower extremities wrapped.  Gastrointestinal system: Abdomen is soft, nontender, nondistended, positive bowel sounds.  No rebound.  No guarding.  Central nervous system: Alert and oriented. No focal neurological deficits. Extremities: Bilateral lower extremities wrapped.  Symmetric 5 x 5 power. Skin: No rashes, lesions or ulcers Psychiatry: Judgement and insight appear normal. Mood & affect appropriate.     Data Reviewed: I have personally reviewed following labs and imaging studies  CBC: Recent Labs  Lab 03/08/23 1411 03/09/23 0311 03/10/23 0312  WBC 10.3 7.9 6.4  NEUTROABS 7.2  --  3.6  HGB 12.6 11.6* 10.8*  HCT 37.7 35.3* 33.2*  MCV 89.5 92.4 91.7  PLT 203 189 186    Basic Metabolic Panel: Recent Labs  Lab 03/08/23 1411 03/09/23 0311 03/10/23 0312  NA 135 132* 133*  K 3.8 3.7 3.4*  CL 100 100 101  CO2 23 21* 23  GLUCOSE 157* 117* 110*  BUN 14 10 10   CREATININE 0.97 0.69 0.70  CALCIUM 8.4* 7.7* 7.6*  MG  --   --  2.2    GFR: Estimated Creatinine Clearance: 52.4 mL/min (by C-G formula based on SCr of 0.7 mg/dL).  Liver  Function Tests: Recent Labs  Lab 03/08/23 1411  AST 23  ALT 13  ALKPHOS 97  BILITOT 0.4  PROT 6.8  ALBUMIN 3.4*    CBG: Recent Labs  Lab 03/09/23 0810 03/09/23 1156 03/09/23 1650 03/09/23 2157 03/10/23 0836  GLUCAP 144* 131* 125* 89 129*     Recent Results (from the past 240 hour(s))  SARS Coronavirus 2 by RT PCR (hospital order, performed in [facility] hospital lab) *cepheid single result test* Urine, Clean Catch     Status: None   Collection Time: 03/08/23  1:46 PM   Specimen: Urine, Clean Catch; Nasal Swab  Result Value Ref Range Status   SARS Coronavirus 2 by RT PCR NEGATIVE NEGATIVE Final    Comment: (NOTE) SARS-CoV-2 target nucleic acids are NOT DETECTED.  The SARS-CoV-2 RNA is generally detectable in upper and lower respiratory specimens during the acute phase of infection. The lowest concentration of SARS-CoV-2 viral copies this assay can detect is 250 copies / mL. A negative result does not preclude SARS-CoV-2 infection and should not be used as the sole basis for treatment or other patient management decisions.  A negative result may occur with improper specimen collection / handling, submission of specimen other than nasopharyngeal swab, presence of viral mutation(s) within the areas targeted by this assay, and inadequate number of viral copies (<250 copies / mL). A negative result must be combined with clinical observations, patient history, and epidemiological information.  Fact Sheet for Patients:   RoadLapTop.co.za  Fact Sheet for Healthcare Providers: http://kim-miller.com/  This test is not yet approved or  cleared by the [place] FDA and has been authorized for detection and/or diagnosis of SARS-CoV-2 by FDA under an Emergency Use Authorization (EUA).  This EUA will remain in effect (meaning this test can be used) for the duration of the COVID-19 declaration under Section 564(b)(1) of the Act,  21 U.S.C. section 360bbb-3(b)(1), unless the authorization is terminated or revoked sooner.  Performed at [facility], [address] [place], [place] [address]   Blood culture (routine x 2)     Status: None (Preliminary result)   Collection Time: 03/08/23  2:18 PM   Specimen: BLOOD  Result Value Ref Range Status  Specimen Description   Final    BLOOD RIGHT ANTECUBITAL Performed at [facility], [name] [place], [place] [address]    Special Requests   Final    BOTTLES DRAWN AEROBIC AND ANAEROBIC Blood Culture adequate volume Performed at [facility], [address] [place], [place] [address]    Culture   Final    NO GROWTH 2 DAYS Performed at [facility], [address] [place], [place] [address]    Report Status PENDING  Incomplete  Blood culture (routine x 2)     Status: None (Preliminary result)   Collection Time: 03/08/23  2:28 PM   Specimen: BLOOD  Result Value Ref Range Status   Specimen Description   Final    BLOOD BLOOD RIGHT HAND Performed at [facility], [address] [place], [place] [address]    Special Requests   Final    BOTTLES DRAWN AEROBIC AND ANAEROBIC Blood Culture results may not be optimal due to an inadequate volume of blood received in culture bottles Performed at [facility], [address] [place], [place] [address]    Culture   Final    NO GROWTH 2 DAYS Performed at [facility], [address], [place], [place] [address]    Report Status PENDING  Incomplete  MRSA Next Gen by PCR, Nasal     Status: None   Collection Time: 03/08/23  7:36 PM   Specimen: Nasal Mucosa; Nasal Swab  Result Value Ref Range Status   MRSA by PCR Next Gen NOT DETECTED NOT DETECTED Final    Comment: (NOTE) The GeneXpert MRSA Assay (FDA approved for NASAL specimens only), is one component of a comprehensive MRSA colonization surveillance program. It is not intended to diagnose MRSA  infection nor to guide or monitor treatment for MRSA infections. Test performance is not FDA approved in patients less than 74 years old. Performed at [facility], [address] [place], [place] [address]          Radiology Studies: DG Chest 2 View  Result Date: 03/09/2023 CLINICAL DATA:  Shortness of breath EXAM: CHEST - 2 VIEW COMPARISON:  02/18/2023 FINDINGS: New right middle lobe airspace opacity.  Left lung clear. Heart size and mediastinal contours are within normal limits. Aortic Atherosclerosis (ICD10-170.0). No effusion. Thoracolumbar and cervical fixation hardware noted. IMPRESSION: Right middle lobe infiltrate. Electronically Signed   By: Corlis Leak M.D.   On: 03/09/2023 20:24   CT Angio Chest PE W and/or Wo Contrast  Result Date: 03/08/2023 CLINICAL DATA:  Pulmonary embolism (PE) suspected, high prob. Cough. Fever with chills. Increased weakness. EXAM: CT ANGIOGRAPHY CHEST WITH CONTRAST TECHNIQUE: Multidetector CT imaging of the chest was performed using the standard protocol during bolus administration of intravenous contrast. Multiplanar CT image reconstructions and MIPs were obtained to evaluate the vascular anatomy. RADIATION DOSE REDUCTION: This exam was performed according to the departmental dose-optimization program which includes automated exposure control, adjustment of the mA and/or kV according to patient size and/or use of iterative reconstruction technique. CONTRAST:  75mL OMNIPAQUE IOHEXOL 350 MG/ML SOLN COMPARISON:  CT scan chest from 02/25/2023. FINDINGS: Cardiovascular: No evidence of embolism to the proximal subsegmental pulmonary artery level. Normal cardiac size. No pericardial effusion. No aortic aneurysm. Mediastinum/Nodes: Visualized thyroid gland appears grossly unremarkable. No solid / cystic mediastinal masses. The esophagus is nondistended precluding optimal assessment. No axillary, mediastinal or hilar lymphadenopathy by  size criteria.  Lungs/Pleura: The central tracheo-bronchial tree is patent. Minimal upper lobe predominant centrilobular emphysema noted. There is consolidation in the medial segment of the middle lobe with air bronchogram, compatible with pneumonia. There are additional patchy opacities throughout bilateral lungs favored to represent pneumonia as well there are innumerable subcentimeter sized noncalcified nodules throughout bilateral lungs which are nonspecific but favored to represent endobronchial spread of infection as well. No pleural effusion or pneumothorax. Upper Abdomen: Evaluation of upper abdominal viscera is markedly limited due to streak artifacts from spinal fixation hardware. Visualized upper abdominal viscera are grossly within normal limits. Musculoskeletal: The visualized soft tissues of the chest wall are grossly unremarkable. No suspicious osseous lesions. There are mild multilevel degenerative changes in the visualized spine. Review of the MIP images confirms the above findings. IMPRESSION: 1. No evidence of pulmonary embolism to the proximal subsegmental pulmonary artery level. 2. Multifocal pneumonia with consolidation in the medial segment of the middle lobe. 3. Innumerable subcentimeter sized noncalcified nodules throughout bilateral lungs which are nonspecific but favored to represent endobronchial spread of infection as well. Short-term follow-up examination in 3 months is recommended to document resolution. Electronically Signed   By: Jules Schick M.D.   On: 03/08/2023 16:30        Scheduled Meds:  bisacodyl  5 mg Oral QHS   carisoprodol  175 mg Oral TID   Chlorhexidine Gluconate Cloth  6 each Topical Daily   dorzolamide-timolol  1 drop Left Eye BID   enoxaparin (LOVENOX) injection  40 mg Subcutaneous Q24H   estradiol  0.5 mg Oral QHS   fluticasone  2 spray Each Nare Daily   fluticasone furoate-vilanterol  1 puff Inhalation Daily   And   umeclidinium bromide  1 puff Inhalation Daily    [START ON 03/11/2023] furosemide  80 mg Oral Daily   guaiFENesin  1,200 mg Oral BID   insulin aspart  0-15 Units Subcutaneous TID WC   insulin aspart  0-5 Units Subcutaneous QHS   levothyroxine  88 mcg Oral QAC breakfast   loratadine  10 mg Oral Daily   ondansetron  4 mg Intravenous Once   pantoprazole  40 mg Oral Daily   potassium chloride SA  80 mEq Oral Daily   simvastatin  40 mg Oral QHS   sodium chloride flush  3 mL Intravenous Q12H   [START ON 03/11/2023] spironolactone  25 mg Oral BID   traZODone  50 mg Oral QHS   Continuous Infusions:  sodium chloride     azithromycin Stopped (03/09/23 2302)   piperacillin-tazobactam (ZOSYN)  IV Stopped (03/10/23 0913)   piperacillin-tazobactam (ZOSYN)  IV Stopped (03/08/23 2128)     LOS: 2 days    Time spent: 40 minutes    Ramiro Harvest, MD [facility]   To contact the attending provider between 7A-7P or the covering provider during after hours 7P-7A, please log into the web site [email or web address] and access using universal [place] password for that web site. If you do not have the password, please call the hospital operator.  03/10/2023, 10:03 AM

## 2023-03-11 DIAGNOSIS — J189 Pneumonia, unspecified organism: Secondary | ICD-10-CM | POA: Diagnosis not present

## 2023-03-11 DIAGNOSIS — E039 Hypothyroidism, unspecified: Secondary | ICD-10-CM | POA: Diagnosis not present

## 2023-03-11 DIAGNOSIS — F17210 Nicotine dependence, cigarettes, uncomplicated: Secondary | ICD-10-CM | POA: Diagnosis not present

## 2023-03-11 DIAGNOSIS — F17200 Nicotine dependence, unspecified, uncomplicated: Secondary | ICD-10-CM | POA: Diagnosis not present

## 2023-03-11 LAB — CBC
HCT: 32.1 % — ABNORMAL LOW (ref 36.0–46.0)
Hemoglobin: 10.6 g/dL — ABNORMAL LOW (ref 12.0–15.0)
MCH: 30.2 pg (ref 26.0–34.0)
MCHC: 33 g/dL (ref 30.0–36.0)
MCV: 91.5 fL (ref 80.0–100.0)
Platelets: 205 10*3/uL (ref 150–400)
RBC: 3.51 MIL/uL — ABNORMAL LOW (ref 3.87–5.11)
RDW: 13.4 % (ref 11.5–15.5)
WBC: 6.3 10*3/uL (ref 4.0–10.5)
nRBC: 0 % (ref 0.0–0.2)

## 2023-03-11 LAB — GLUCOSE, CAPILLARY
Glucose-Capillary: 164 mg/dL — ABNORMAL HIGH (ref 70–99)
Glucose-Capillary: 144 mg/dL — ABNORMAL HIGH (ref 70–99)
Glucose-Capillary: 121 mg/dL — ABNORMAL HIGH (ref 70–99)
Glucose-Capillary: 177 mg/dL — ABNORMAL HIGH (ref 70–99)

## 2023-03-11 LAB — BASIC METABOLIC PANEL
Anion gap: 8 (ref 5–15)
BUN: 9 mg/dL (ref 8–23)
CO2: 21 mmol/L — ABNORMAL LOW (ref 22–32)
Calcium: 7.3 mg/dL — ABNORMAL LOW (ref 8.9–10.3)
Chloride: 105 mmol/L (ref 98–111)
Creatinine, Ser: 0.62 mg/dL (ref 0.44–1.00)
GFR, Estimated: >60 mL/min (ref >60)
Glucose, Bld: 124 mg/dL — ABNORMAL HIGH (ref 70–99)
Potassium: 3.7 mmol/L (ref 3.5–5.1)
Sodium: 134 mmol/L — ABNORMAL LOW (ref 135–145)

## 2023-03-11 NOTE — Progress Notes (Signed)
Mobility Specialist - Progress Note  (RA) Pre-mobility: 73 bpm HR, 94% SpO2 During mobility: 93 bpm HR, 93% SpO2 Post-mobility: 86 bpm HR, 96% SPO2   03/11/23 1039  Mobility  Activity Ambulated with assistance in hallway  Level of Assistance Contact guard assist, steadying assist  Assistive Device Cane  Distance Ambulated (ft) 250 ft  Range of Motion/Exercises Active  Activity Response Tolerated well  Mobility Referral Yes  $Mobility charge 1 Mobility  Mobility Specialist Start Time (ACUTE ONLY) 1015  Mobility Specialist Stop Time (ACUTE ONLY) 1039  Mobility Specialist Time Calculation (min) (ACUTE ONLY) 24 min   Pt was found sitting EOB and agreeable to ambulate. Stated feeling weak. At EOS returned to recliner chair with all needs met. Call bell in reach. Chair alarm on.  Billey Chang Mobility Specialist

## 2023-03-11 NOTE — Plan of Care (Signed)
  Problem: Education: Goal: Knowledge of General Education information will improve Description: Including pain rating scale, medication(s)/side effects and non-pharmacologic comfort measures Outcome: Progressing   Problem: Clinical Measurements: Goal: Diagnostic test results will improve Outcome: Progressing   Problem: Activity: Goal: Risk for activity intolerance will decrease Outcome: Progressing   

## 2023-03-11 NOTE — Progress Notes (Signed)
PROGRESS NOTE    Joan [name]  PIR:[id number] DOB: [birth year] DOA: 03/08/2023 PCP: Donato Schultz, DO    Chief Complaint  Patient presents with   Weakness   Fever   Cough    Brief Narrative:  HPI per Dr.Goel Joan [name] is a 77 y.o. female with medical history significant of COPD as noted in chart (but denied by patient).   Patient was in her usual state of health till about 02/18/2023 when she had a new onset cough. Patient doe snot report any expectoration.    Per record review from PCP:   seen in office 8/12 for cough, prescribed doxycycline, and an xray was ordered.    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   CT 8/19 demonstrated New airspace disease in the right upper lobe with persistent airspace disease in the right middle lobe . Pt followed up via mychart with pulm, who agreed this right upper lobe developed was consistent w/ pneumonia.    Per report from patient she was started on doxycycline - took it for three days but was vomiting- changed to levofloxacin - took all of it. Finished it "a few days" ago.    Patient came back to the primary care office earlier today complaining of ongoing extreme weakness, sensation of shortness of breath as well as reportedly vomiting at home.  However to me she only reports her extreme weakness as being the problem she denies any chest pain except when she coughs and denies any shortness of breath.  She reports subjective fevers feeling extremely cold but has not taken her temperature at home.  There is no report of any leg swelling or diarrhea or any ongoing vomiting.   [facility] care provider transferred the patient to the ER for failure of outpatient antibiotic therapy, patient subsequently received Zosyn and has been transferred to the stepdown unit at [facility].  Medical evaluation is sought.   Patient reports  no specific complaints at the time of this encounter.    Assessment & Plan:   Principal Problem:   Multifocal pneumonia Active Problems:   Hypothyroidism   TOBACCO ABUSE   GERD   Lower extremity edema   Cigarette smoker   Pneumonia   Lymphedema  #1 multifocal pneumonia -Patient noted to have failed outpatient therapy. -Patient initially diagnosed 02/18/2023 received 3 days of doxycycline which she could not tolerate and antibiotics changed to Levaquin which she completed in the outpatient setting. -Patient does state due to nausea and vomiting unable to tolerate doxycycline.  Patient stated she also took Levaquin at home however did have some bouts of emesis. -On follow-up CT 02/25/2023 with new airspace disease noted in the right upper lobe with persistent airspace disease in the right middle lobe. -Patient presented to PCPs office on day of admission with generalized weakness, fatigue, shortness of breath, productive cough, subjective fevers and chills. -CT angiogram chest done with progression of right middle lobe opacification compared to CAT scan done 02/25/2023. -Patient noted to have soft blood pressure which responded to IV fluids. -Patient pancultured, blood cultures obtained and pending. -MRSA PCR negative. -SARS coronavirus 2 PCR negative. - SLP evaluation.   -Discontinued IV vancomycin.  -Continue azithromycin, IV Zosyn.   -Mucinex. -IV antiemetics, supportive care.  2.  Hypothyroidism -Synthroid.  3.  Hypertension -BP improved.   -Lasix times renal lactone held and will resume today and monitor blood  pressure.   4.  Tobacco abuse -Tobacco cessation stressed to patient..  5.  GERD -Continue PPI.   6.  Diabetes mellitus type 2 -Hemoglobin A1c 7.1 (02/18/2023) -CBG 164 this morning. -SSI. -Continue to hold oral hypoglycemic agents.  7.  Lower extremity edema/lymphedema -Patient states on Lasix and spironolactone for lymphedema. -Lower extremities  wrapped. -Will resume Lasix spironolactone today and monitor blood pressure.    8.  Hypokalemia -Repleted.   DVT prophylaxis: Lovenox Code Status: Full Family Communication: Updated patient.  No family at bedside. Disposition: Transfer to progressive care floor.   Status is: Inpatient Remains inpatient appropriate because: Severity of illness   Consultants:  None  Procedures:  CT angiogram chest 03/08/2023 Chest x-ray 03/09/2023  Antimicrobials:  Anti-infectives (From admission, onward)    Start     Dose/Rate Route Frequency Ordered Stop   03/10/23 0600  vancomycin (VANCOCIN) IVPB 1000 mg/200 mL premix  Status:  Discontinued        1,000 mg 200 mL/hr over 60 Minutes Intravenous Every 36 hours 03/08/23 2126 03/09/23 1422   03/09/23 1800  vancomycin (VANCOREADY) IVPB 750 mg/150 mL  Status:  Discontinued        750 mg 150 mL/hr over 60 Minutes Intravenous Every 24 hours 03/09/23 1422 03/10/23 0801   03/08/23 2200  azithromycin (ZITHROMAX) 500 mg in sodium chloride 0.9 % 250 mL IVPB        500 mg 250 mL/hr over 60 Minutes Intravenous Every 24 hours 03/08/23 2056 03/11/23 0231   03/08/23 2200  piperacillin-tazobactam (ZOSYN) IVPB 3.375 g        3.375 g 12.5 mL/hr over 240 Minutes Intravenous Every 8 hours 03/08/23 2103     03/08/23 2200  piperacillin-tazobactam (ZOSYN) IVPB 3.375 g        3.375 g 12.5 mL/hr over 240 Minutes Intravenous  Once 03/08/23 2110     03/08/23 1715  piperacillin-tazobactam (ZOSYN) IVPB 3.375 g  Status:  Discontinued        3.375 g 12.5 mL/hr over 240 Minutes Intravenous  Once 03/08/23 1714 03/08/23 2200   03/08/23 1715  vancomycin (VANCOCIN) IVPB 1000 mg/200 mL premix  Status:  Discontinued        1,000 mg 200 mL/hr over 60 Minutes Intravenous  Once 03/08/23 1714 03/08/23 2001         Subjective: Patient sitting up at the side of the bed.  Getting ready to work with her mobility specialist.  Patient denies any chest pain.  Feels shortness of  breath improved.  States current pain regimen of oxycodone 5 mg not managing her chronic pain and states she is usually on Percocet 10/325.  Tolerating current diet.  Nausea and emesis have improved.  Stated had nausea and emesis with oral antibiotics prior to admission and seems very hesitant to be transitioned to oral antibiotics.    Objective: Vitals:   03/10/23 1619 03/10/23 2029 03/11/23 0039 03/11/23 0834  BP: (!) 134/57 113/65 116/62 115/61  Pulse: 72 64 67   Resp: 18 18 18 20   Temp: 98.2 F (36.8 C) 98.3 F (36.8 C) 98.9 F (37.2 C) 97.9 F (36.6 C)  TempSrc: Oral Oral Oral Oral  SpO2: 93% 95% 94% 98%  Weight:      Height:        Intake/Output Summary (Last 24 hours) at 03/11/2023 1054 Last data filed at 03/11/2023 0800 Gross per 24 hour  Intake 848.48 ml  Output --  Net 848.48 ml   [employer]  03/08/23 1342 03/08/23 1918  Weight: 75 kg 75 kg    Examination:  General exam: NAD. Respiratory system: Decreased coarse rhonchorous breath sounds.  No crackles no wheezing.  Fair air movement.  Speaking in full sentences. Cardiovascular system: Regular rate rhythm no murmurs rubs or gallops.  No JVD.  Bilateral lower extremities wrapped.  Gastrointestinal system: Abdomen is soft, nontender, nondistended, positive bowel sounds.  No rebound.  No guarding.  Central nervous system: Alert and oriented. No focal neurological deficits. Extremities: Bilateral lower extremities wrapped.  Symmetric 5 x 5 power. Skin: No rashes, lesions or ulcers Psychiatry: Judgement and insight appear normal. Mood & affect appropriate.     Data Reviewed: I have personally reviewed following labs and imaging studies  CBC: Recent Labs  Lab 03/08/23 1411 03/09/23 0311 03/10/23 0312 03/11/23 0402  WBC 10.3 7.9 6.4 6.3  NEUTROABS 7.2  --  3.6  --   HGB 12.6 11.6* 10.8* 10.6*  HCT 37.7 35.3* 33.2* 32.1*  MCV 89.5 92.4 91.7 91.5  PLT 203 189 186 205    Basic Metabolic Panel: Recent Labs   Lab 03/08/23 1411 03/09/23 0311 03/10/23 0312 03/11/23 0402  NA 135 132* 133* 134*  K 3.8 3.7 3.4* 3.7  CL 100 100 101 105  CO2 23 21* 23 21*  GLUCOSE 157* 117* 110* 124*  BUN 14 10 10 9   CREATININE 0.97 0.69 0.70 0.62  CALCIUM 8.4* 7.7* 7.6* 7.3*  MG  --   --  2.2  --     GFR: Estimated Creatinine Clearance: 52.4 mL/min (by C-G formula based on SCr of 0.62 mg/dL).  Liver Function Tests: Recent Labs  Lab 03/08/23 1411  AST 23  ALT 13  ALKPHOS 97  BILITOT 0.4  PROT 6.8  ALBUMIN 3.4*    CBG: Recent Labs  Lab 03/10/23 0836 03/10/23 1150 03/10/23 1638 03/10/23 2027 03/11/23 0806  GLUCAP 129* 138* 133* 137* 164*     Recent Results (from the past 240 hour(s))  SARS Coronavirus 2 by RT PCR (hospital order, performed in [facility] hospital lab) *cepheid single result test* Urine, Clean Catch     Status: None   Collection Time: 03/08/23  1:46 PM   Specimen: Urine, Clean Catch; Nasal Swab  Result Value Ref Range Status   SARS Coronavirus 2 by RT PCR NEGATIVE NEGATIVE Final    Comment: (NOTE) SARS-CoV-2 target nucleic acids are NOT DETECTED.  The SARS-CoV-2 RNA is generally detectable in upper and lower respiratory specimens during the acute phase of infection. The lowest concentration of SARS-CoV-2 viral copies this assay can detect is 250 copies / mL. A negative result does not preclude SARS-CoV-2 infection and should not be used as the sole basis for treatment or other patient management decisions.  A negative result may occur with improper specimen collection / handling, submission of specimen other than nasopharyngeal swab, presence of viral mutation(s) within the areas targeted by this assay, and inadequate number of viral copies (<250 copies / mL). A negative result must be combined with clinical observations, patient history, and epidemiological information.  Fact Sheet for Patients:   RoadLapTop.co.za  Fact Sheet for  Healthcare Providers: http://kim-miller.com/  This test is not yet approved or  cleared by the [place] FDA and has been authorized for detection and/or diagnosis of SARS-CoV-2 by FDA under an Emergency Use Authorization (EUA).  This EUA will remain in effect (meaning this test can be used) for the duration of the COVID-19 declaration under Section 564(b)(1)  of the Act, 21 U.S.C. section 360bbb-3(b)(1), unless the authorization is terminated or revoked sooner.  Performed at [facility], [address] [place], [place] [address]   Blood culture (routine x 2)     Status: None (Preliminary result)   Collection Time: 03/08/23  2:18 PM   Specimen: BLOOD  Result Value Ref Range Status   Specimen Description   Final    BLOOD RIGHT ANTECUBITAL Performed at [facility], [address] [place], [place] [address]    Special Requests   Final    BOTTLES DRAWN AEROBIC AND ANAEROBIC Blood Culture adequate volume Performed at [facility], [address] [place], [place] [address]    Culture   Final    NO GROWTH 3 DAYS Performed at [facility], [address], [place], [place] [address]    Report Status PENDING  Incomplete  Blood culture (routine x 2)     Status: None (Preliminary result)   Collection Time: 03/08/23  2:28 PM   Specimen: BLOOD  Result Value Ref Range Status   Specimen Description   Final    BLOOD BLOOD RIGHT HAND Performed at [facility], [address] [place], [place] [address]    Special Requests   Final    BOTTLES DRAWN AEROBIC AND ANAEROBIC Blood Culture results may not be optimal due to an inadequate volume of blood received in culture bottles Performed at [facility], [address] [place], [place] [address]    Culture   Final    NO GROWTH 3 DAYS Performed at [facility], [address], [place], [place] [address]    Report Status PENDING  Incomplete  MRSA Next  Gen by PCR, Nasal     Status: None   Collection Time: 03/08/23  7:36 PM   Specimen: Nasal Mucosa; Nasal Swab  Result Value Ref Range Status   MRSA by PCR Next Gen NOT DETECTED NOT DETECTED Final    Comment: (NOTE) The GeneXpert MRSA Assay (FDA approved for NASAL specimens only), is one component of a comprehensive MRSA colonization surveillance program. It is not intended to diagnose MRSA infection nor to guide or monitor treatment for MRSA infections. Test performance is not FDA approved in patients less than 1 years old. Performed at [facility], [address] [place], [place] [address]          Radiology Studies: DG Chest 2 View  Result Date: 03/09/2023 CLINICAL DATA:  Shortness of breath EXAM: CHEST - 2 VIEW COMPARISON:  02/18/2023 FINDINGS: New right middle lobe airspace opacity.  Left lung clear. Heart size and mediastinal contours are within normal limits. Aortic Atherosclerosis (ICD10-170.0). No effusion. Thoracolumbar and cervical fixation hardware noted. IMPRESSION: Right middle lobe infiltrate. Electronically Signed   By: Corlis Leak M.D.   On: 03/09/2023 20:24        Scheduled Meds:  bisacodyl  5 mg Oral QHS   carisoprodol  175 mg Oral TID   Chlorhexidine Gluconate Cloth  6 each Topical Daily   dorzolamide-timolol  1 drop Left Eye BID   enoxaparin (LOVENOX) injection  40 mg Subcutaneous Q24H   estradiol  0.5 mg Oral QHS   feeding supplement  237 mL Oral BID BM   fluticasone  2 spray Each Nare Daily   fluticasone furoate-vilanterol  1 puff Inhalation Daily   And   umeclidinium bromide  1 puff Inhalation  Daily   furosemide  80 mg Oral Daily   guaiFENesin  1,200 mg Oral BID   insulin aspart  0-15 Units Subcutaneous TID WC   insulin aspart  0-5 Units Subcutaneous QHS   levothyroxine  88 mcg Oral QAC breakfast   loratadine  10 mg Oral Daily   ondansetron  4 mg Intravenous Once   pantoprazole  40 mg Oral Daily   potassium chloride SA  80  mEq Oral Daily   simvastatin  40 mg Oral QHS   sodium chloride flush  3 mL Intravenous Q12H   spironolactone  25 mg Oral BID   traZODone  50 mg Oral QHS   triamcinolone cream   Topical BID   Continuous Infusions:  sodium chloride     piperacillin-tazobactam (ZOSYN)  IV 3.375 g (03/11/23 0437)   piperacillin-tazobactam (ZOSYN)  IV Stopped (03/08/23 2128)     LOS: 3 days    Time spent: 40 minutes    Ramiro Harvest, MD [facility]   To contact the attending provider between 7A-7P or the covering provider during after hours 7P-7A, please log into the web site [email or web address] and access using universal [place] password for that web site. If you do not have the password, please call the hospital operator.  03/11/2023, 10:54 AM

## 2023-03-12 DIAGNOSIS — F17200 Nicotine dependence, unspecified, uncomplicated: Secondary | ICD-10-CM | POA: Diagnosis not present

## 2023-03-12 DIAGNOSIS — E039 Hypothyroidism, unspecified: Secondary | ICD-10-CM | POA: Diagnosis not present

## 2023-03-12 DIAGNOSIS — F17210 Nicotine dependence, cigarettes, uncomplicated: Secondary | ICD-10-CM | POA: Diagnosis not present

## 2023-03-12 DIAGNOSIS — J189 Pneumonia, unspecified organism: Secondary | ICD-10-CM | POA: Diagnosis not present

## 2023-03-12 LAB — CBC
HCT: 36.3 % (ref 36.0–46.0)
Hemoglobin: 11.8 g/dL — ABNORMAL LOW (ref 12.0–15.0)
MCH: 30.3 pg (ref 26.0–34.0)
MCHC: 32.5 g/dL (ref 30.0–36.0)
MCV: 93.1 fL (ref 80.0–100.0)
Platelets: 243 10*3/uL (ref 150–400)
RBC: 3.9 MIL/uL (ref 3.87–5.11)
RDW: 13.5 % (ref 11.5–15.5)
WBC: 6.5 10*3/uL (ref 4.0–10.5)
nRBC: 0 % (ref 0.0–0.2)

## 2023-03-12 LAB — BASIC METABOLIC PANEL
Anion gap: 8 (ref 5–15)
BUN: 8 mg/dL (ref 8–23)
CO2: 21 mmol/L — ABNORMAL LOW (ref 22–32)
Calcium: 7.7 mg/dL — ABNORMAL LOW (ref 8.9–10.3)
Chloride: 106 mmol/L (ref 98–111)
Creatinine, Ser: 0.67 mg/dL (ref 0.44–1.00)
GFR, Estimated: >60 mL/min (ref >60)
Glucose, Bld: 137 mg/dL — ABNORMAL HIGH (ref 70–99)
Potassium: 3.9 mmol/L (ref 3.5–5.1)
Sodium: 135 mmol/L (ref 135–145)

## 2023-03-12 LAB — GLUCOSE, CAPILLARY
Glucose-Capillary: 114 mg/dL — ABNORMAL HIGH (ref 70–99)
Glucose-Capillary: 149 mg/dL — ABNORMAL HIGH (ref 70–99)
Glucose-Capillary: 166 mg/dL — ABNORMAL HIGH (ref 70–99)
Glucose-Capillary: 120 mg/dL — ABNORMAL HIGH (ref 70–99)

## 2023-03-12 MED ORDER — IBUPROFEN 200 MG PO TABS
400.0000 mg | ORAL_TABLET | Freq: Every day | ORAL | Status: DC
  Administered 2023-03-12 – 2023-03-15 (×4): 400 mg via ORAL
  Filled 2023-03-12 (×4): qty 2

## 2023-03-12 MED ORDER — SPIRONOLACTONE 25 MG PO TABS
25.0000 mg | ORAL_TABLET | Freq: Every day | ORAL | Status: DC
  Administered 2023-03-13 – 2023-03-15 (×3): 25 mg via ORAL
  Filled 2023-03-12 (×3): qty 1

## 2023-03-12 NOTE — Progress Notes (Signed)
PT Cancellation Note  Patient Details Name: Joan [name] MRN: [id number] DOB: 1946-01-28   Cancelled Treatment:    Reason Eval/Treat Not Completed:  2nd attempt to complete PT eval-pt declined to participate at this time. Will check back another day.    Faye Ramsay, PT Acute Rehabilitation  Office: [phone]

## 2023-03-12 NOTE — TOC Initial Note (Signed)
Transition of Care [facility]) - Initial/Assessment Note    Patient Details  Name: Joan [name] MRN: [id number] Date of Birth: [birth year]  Transition of [facility] CM/SW Contact:    Larrie Kass, LCSW Phone Number: 03/12/2023, 4:32 PM  Clinical Narrative:                  Per chart review PT eval was rec . Will follow for recommendation.        Patient Goals and CMS Choice            Expected Discharge Plan and Services                                              Prior Living Arrangements/Services                       Activities of Daily Living Home Assistive Devices/Equipment: Eyeglasses, Gilmer Mor (specify quad or straight) ADL Screening (condition at time of admission) Patient's cognitive ability adequate to safely complete daily activities?: Yes Is the patient deaf or have difficulty hearing?: No Does the patient have difficulty seeing, even when wearing glasses/contacts?: No Does the patient have difficulty concentrating, remembering, or making decisions?: No Patient able to express need for assistance with ADLs?: Yes Does the patient have difficulty dressing or bathing?: No Independently performs ADLs?: Yes (appropriate for developmental age) Does the patient have difficulty walking or climbing stairs?: Yes Weakness of Legs: Both Weakness of Arms/Hands: None  Permission Sought/Granted                  Emotional Assessment              Admission diagnosis:  Multifocal pneumonia [J18.9] Pneumonia [J18.9] 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   Stasis dermatitis of both legs 10/04/2021   Hordeolum externum of left upper eyelid 10/04/2021   COVID-19 05/18/2021   COPD 03/16/2020   Multiple pulmonary nodules determined by computed tomography of lung 03/16/2020    Cigarette smoker 03/16/2020   Lower extremity edema 12/16/2019   S/P total hip arthroplasty 09/09/2019   Wound infection 05/28/2019   Hypokalemia 05/28/2019   OA (osteoarthritis) of hip 04/27/2019   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   History of arthroplasty of knee 09/24/2017   Insomnia 12/13/2016   Inflamed seborrheic keratosis 12/11/2016   Actinic dermatitis 12/11/2016   Melanocytic nevi, unspecified 12/26/2015   Ephelides 12/26/2015   Preventative health care 09/08/2014   Ovarian cyst 08/13/2014   Preoperative cardiovascular examination 08/13/2014   Abnormal EKG 08/13/2014   PVC's (premature ventricular contractions)    Actinic keratosis 05/04/2014   Fracture of left distal radius 12/30/2013   Spinal stenosis of lumbar region 06/24/2013   Atherosclerosis of aorta (HCC) 05/06/2013   Osteopenia 03/06/2013   Cellulitis of lower extremity 11/24/2012   Edema 11/05/2012   Myalgia 04/11/2011   Night sweats 12/27/2010   Contact lens/glasses fitting 12/27/2010   Menopause 12/27/2010   Family history of breast cancer 12/27/2010   ADJUSTMENT DISORDER WITH ANXIOUS MOOD 05/26/2010   PULMONARY NODULE, LEFT UPPER LOBE 12/15/2009   CONSTIPATION, DRUG INDUCED 08/19/2009   KNEE PAIN, LEFT 09/13/2008   THYROID NODULE, LEFT 05/26/2008   GERD 05/26/2008  INSOMNIA 05/06/2008   Anemia 10/24/2007   Hypothyroidism 08/12/2007   Hyperlipidemia 08/12/2007   TOBACCO ABUSE 08/12/2007   Primary hypertension 08/12/2007   OA (osteoarthritis) of knee 08/12/2007   PCP:  Donato Schultz, DO Pharmacy:   [facility] [address] [place] - [address] [place] [address] [place] [address] Phone: [phone] Fax: [phone]  [facility] Delivery - [place], [place] - [address] [place] [address] Phone: [phone] Fax: [phone]     Social Determinants of Health (SDOH) Social History: SDOH Screenings   Food  Insecurity: No Food Insecurity (03/08/2023)  Housing: Low Risk  (03/08/2023)  Transportation Needs: No Transportation Needs (03/08/2023)  Utilities: Not At Risk (03/08/2023)  Alcohol Screen: Low Risk  (11/18/2022)  Depression (PHQ2-9): Low Risk  (11/19/2022)  Financial Resource Strain: Low Risk  (11/18/2022)  Physical Activity: Insufficiently Active (11/18/2022)  Social Connections: Unknown (11/18/2022)  Stress: No Stress Concern Present (11/18/2022)  Tobacco Use: High Risk (03/08/2023)   SDOH Interventions:     Readmission Risk Interventions     No data to display

## 2023-03-12 NOTE — Telephone Encounter (Signed)
Noted. Patient currently admitted to hospital.

## 2023-03-12 NOTE — Progress Notes (Signed)
PROGRESS NOTE    Joan [name]  XLK:[id number] DOB: October 09, 1945 DOA: 03/08/2023 PCP: Donato Schultz, DO    Chief Complaint  Patient presents with   Weakness   Fever   Cough    Brief Narrative:  HPI per Dr.Goel [name] is a 77 y.o. female with medical history significant of COPD as noted in chart (but denied by patient).   Patient was in her usual state of health till about 02/18/2023 when she had a new onset cough. Patient doe snot report any expectoration.    Per record review from PCP:   seen in office 8/12 for cough, prescribed doxycycline, and an xray was ordered.    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   CT 8/19 demonstrated New airspace disease in the right upper lobe with persistent airspace disease in the right middle lobe . Pt followed up via mychart with pulm, who agreed this right upper lobe developed was consistent w/ pneumonia.    Per report from patient she was started on doxycycline - took it for three days but was vomiting- changed to levofloxacin - took all of it. Finished it "a few days" ago.    Patient came back to the primary care office earlier today complaining of ongoing extreme weakness, sensation of shortness of breath as well as reportedly vomiting at home.  However to me she only reports her extreme weakness as being the problem she denies any chest pain except when she coughs and denies any shortness of breath.  She reports subjective fevers feeling extremely cold but has not taken her temperature at home.  There is no report of any leg swelling or diarrhea or any ongoing vomiting.   [facility] care provider transferred the patient to the ER for failure of outpatient antibiotic therapy, patient subsequently received Zosyn and has been transferred to the stepdown unit at [facility].  Medical evaluation is sought.   Patient reports  no specific complaints at the time of this encounter.    Assessment & Plan:   Principal Problem:   Multifocal pneumonia Active Problems:   Hypothyroidism   TOBACCO ABUSE   GERD   Lower extremity edema   Cigarette smoker   Pneumonia   Lymphedema  #1 multifocal pneumonia -Patient noted to have failed outpatient therapy. -Patient initially diagnosed 02/18/2023 received 3 days of doxycycline which she could not tolerate and antibiotics changed to Levaquin which she completed in the outpatient setting however states was having bouts of nausea and emesis while on Levaquin. -Patient does state due to nausea and vomiting unable to tolerate doxycycline.  -On follow-up CT 02/25/2023 with new airspace disease noted in the right upper lobe with persistent airspace disease in the right middle lobe. -Patient presented to PCPs office on day of admission with generalized weakness, fatigue, shortness of breath, productive cough, subjective fevers and chills. -CT angiogram chest done with progression of right middle lobe opacification compared to CAT scan done 02/25/2023. -Patient noted to have soft blood pressure which responded to IV fluids. -Patient pancultured, blood cultures obtained and pending. -MRSA PCR negative. -SARS coronavirus 2 PCR negative. - SLP evaluation.   -Discontinued IV vancomycin.  -Status post azithromycin.   -Continue IV Zosyn and if continued improvement transition to Augmentin tomorrow. -Mucinex. -IV antiemetics, supportive care.  2.  Hypothyroidism -Synthroid.  3.  Hypertension -BP soft this morning.   -Discontinue Lasix and  placed on half home dose spironolactone.    4.  Tobacco abuse -Tobacco cessation stressed to patient..  5.  GERD -PPI.  6.  Diabetes mellitus type 2 -Hemoglobin A1c 7.1 (02/18/2023) -CBG 114 this morning. -Continue to hold oral hypoglycemic agents. -SSI.  7.  Lower extremity edema/lymphedema -Patient states on Lasix and spironolactone  for lymphedema. -Lower extremities wrapped. -Patient with soft borderline blood pressure and as such discontinue Lasix and placed on half home dose spironolactone.  8.  Hypokalemia -Repleted, potassium at 3.9.  9.??  IV infiltration right upper extremity/??  Superficial thrombophlebitis -Warm compresses 4 times daily. -Keep right upper extremity elevated. -Ibuprofen daily x 5 days -Supportive care.   DVT prophylaxis: Lovenox Code Status: Full Family Communication: Updated patient.  No family at bedside. Disposition: Transfer to progressive care floor.   Status is: Inpatient Remains inpatient appropriate because: Severity of illness   Consultants:  None  Procedures:  CT angiogram chest 03/08/2023 Chest x-ray 03/09/2023  Antimicrobials:  Anti-infectives (From admission, onward)    Start     Dose/Rate Route Frequency Ordered Stop   03/10/23 0600  vancomycin (VANCOCIN) IVPB 1000 mg/200 mL premix  Status:  Discontinued        1,000 mg 200 mL/hr over 60 Minutes Intravenous Every 36 hours 03/08/23 2126 03/09/23 1422   03/09/23 1800  vancomycin (VANCOREADY) IVPB 750 mg/150 mL  Status:  Discontinued        750 mg 150 mL/hr over 60 Minutes Intravenous Every 24 hours 03/09/23 1422 03/10/23 0801   03/08/23 2200  azithromycin (ZITHROMAX) 500 mg in sodium chloride 0.9 % 250 mL IVPB        500 mg 250 mL/hr over 60 Minutes Intravenous Every 24 hours 03/08/23 2056 03/11/23 0231   03/08/23 2200  piperacillin-tazobactam (ZOSYN) IVPB 3.375 g        3.375 g 12.5 mL/hr over 240 Minutes Intravenous Every 8 hours 03/08/23 2103     03/08/23 2200  piperacillin-tazobactam (ZOSYN) IVPB 3.375 g        3.375 g 12.5 mL/hr over 240 Minutes Intravenous  Once 03/08/23 2110     03/08/23 1715  piperacillin-tazobactam (ZOSYN) IVPB 3.375 g  Status:  Discontinued        3.375 g 12.5 mL/hr over 240 Minutes Intravenous  Once 03/08/23 1714 03/08/23 2200   03/08/23 1715  vancomycin (VANCOCIN) IVPB 1000 mg/200  mL premix  Status:  Discontinued        1,000 mg 200 mL/hr over 60 Minutes Intravenous  Once 03/08/23 1714 03/08/23 2001         Subjective: Sitting up in bed, awake.  Feels shortness of breath improved.  No chest pain.  Cough improved.  Complaining of pain in the right antecubital fossa around the prior IV site was.    Objective: Vitals:   03/11/23 0834 03/11/23 1254 03/11/23 2216 03/12/23 0627  BP: 115/61 (!) 101/57 115/66 108/68  Pulse:  64 68 63  Resp: 20 17 18 17   Temp: 97.9 F (36.6 C) 98.3 F (36.8 C) 98.6 F (37 C) 98.3 F (36.8 C)  TempSrc: Oral Oral Oral Oral  SpO2: 98% 95% 95% 95%  Weight:      Height:        Intake/Output Summary (Last 24 hours) at 03/12/2023 1028 Last data filed at 03/12/2023 0845 Gross per 24 hour  Intake 881.42 ml  Output --  Net 881.42 ml   Filed Weights   03/08/23 1342 03/08/23 1918  Weight: 75  kg 75 kg    Examination:  General exam: NAD. Respiratory system: Improving diffuse coarse rhonchorous breath sounds.  No crackles.  No wheezing.  Fair air movement.  Speaking in full sentences. Cardiovascular system: RRR no murmurs rubs or gallops.  No JVD.  Bilateral lower extremities wrapped. Gastrointestinal system: Abdomen is soft, nontender, nondistended, positive bowel sounds.  No rebound.  No guarding.   Central nervous system: Alert and oriented. No focal neurological deficits. Extremities: Bilateral lower extremities wrapped.  Symmetric 5 x 5 power. Skin: No rashes, lesions or ulcers Psychiatry: Judgement and insight appear normal. Mood & affect appropriate.     Data Reviewed: I have personally reviewed following labs and imaging studies  CBC: Recent Labs  Lab 03/08/23 1411 03/09/23 0311 03/10/23 0312 03/11/23 0402 03/12/23 0419  WBC 10.3 7.9 6.4 6.3 6.5  NEUTROABS 7.2  --  3.6  --   --   HGB 12.6 11.6* 10.8* 10.6* 11.8*  HCT 37.7 35.3* 33.2* 32.1* 36.3  MCV 89.5 92.4 91.7 91.5 93.1  PLT 203 189 186 205 243    Basic  Metabolic Panel: Recent Labs  Lab 03/08/23 1411 03/09/23 0311 03/10/23 0312 03/11/23 0402 03/12/23 0419  NA 135 132* 133* 134* 135  K 3.8 3.7 3.4* 3.7 3.9  CL 100 100 101 105 106  CO2 23 21* 23 21* 21*  GLUCOSE 157* 117* 110* 124* 137*  BUN 14 10 10 9 8   CREATININE 0.97 0.69 0.70 0.62 0.67  CALCIUM 8.4* 7.7* 7.6* 7.3* 7.7*  MG  --   --  2.2  --   --     GFR: Estimated Creatinine Clearance: 52.4 mL/min (by C-G formula based on SCr of 0.67 mg/dL).  Liver Function Tests: Recent Labs  Lab 03/08/23 1411  AST 23  ALT 13  ALKPHOS 97  BILITOT 0.4  PROT 6.8  ALBUMIN 3.4*    CBG: Recent Labs  Lab 03/11/23 0806 03/11/23 1211 03/11/23 1625 03/11/23 2218 03/12/23 0728  GLUCAP 164* 144* 121* 177* 114*     Recent Results (from the past 240 hour(s))  SARS Coronavirus 2 by RT PCR (hospital order, performed in [facility] hospital lab) *cepheid single result test* Urine, Clean Catch     Status: None   Collection Time: 03/08/23  1:46 PM   Specimen: Urine, Clean Catch; Nasal Swab  Result Value Ref Range Status   SARS Coronavirus 2 by RT PCR NEGATIVE NEGATIVE Final    Comment: (NOTE) SARS-CoV-2 target nucleic acids are NOT DETECTED.  The SARS-CoV-2 RNA is generally detectable in upper and lower respiratory specimens during the acute phase of infection. The lowest concentration of SARS-CoV-2 viral copies this assay can detect is 250 copies / mL. A negative result does not preclude SARS-CoV-2 infection and should not be used as the sole basis for treatment or other patient management decisions.  A negative result may occur with improper specimen collection / handling, submission of specimen other than nasopharyngeal swab, presence of viral mutation(s) within the areas targeted by this assay, and inadequate number of viral copies (<250 copies / mL). A negative result must be combined with clinical observations, patient history, and epidemiological information.  Fact Sheet  for Patients:   RoadLapTop.co.za  Fact Sheet for Healthcare Providers: http://kim-miller.com/  This test is not yet approved or  cleared by the [place] FDA and has been authorized for detection and/or diagnosis of SARS-CoV-2 by FDA under an Emergency Use Authorization (EUA).  This EUA will remain in effect (  meaning this test can be used) for the duration of the COVID-19 declaration under Section 564(b)(1) of the Act, 21 U.S.C. section 360bbb-3(b)(1), unless the authorization is terminated or revoked sooner.  Performed at [facility], [address] [place], [place] [address]   Blood culture (routine x 2)     Status: None (Preliminary result)   Collection Time: 03/08/23  2:18 PM   Specimen: BLOOD  Result Value Ref Range Status   Specimen Description   Final    BLOOD RIGHT ANTECUBITAL Performed at [facility], [address] [place], [place] [address]    Special Requests   Final    BOTTLES DRAWN AEROBIC AND ANAEROBIC Blood Culture adequate volume Performed at [facility], [address] [place], [place] [address]    Culture   Final    NO GROWTH 4 DAYS Performed at [facility], [address] [place], [place] [address]    Report Status PENDING  Incomplete  Blood culture (routine x 2)     Status: None (Preliminary result)   Collection Time: 03/08/23  2:28 PM   Specimen: BLOOD  Result Value Ref Range Status   Specimen Description   Final    BLOOD BLOOD RIGHT HAND Performed at [facility], [address] [place], [place] [address]    Special Requests   Final    BOTTLES DRAWN AEROBIC AND ANAEROBIC Blood Culture results may not be optimal due to an inadequate volume of blood received in culture bottles Performed at [facility], [address] [place], [place] [address]    Culture   Final    NO GROWTH 4 DAYS Performed at [facility], [address], [place], [place] [address]    Report Status PENDING  Incomplete  MRSA Next Gen by PCR, Nasal     Status: None   Collection Time: 03/08/23  7:36 PM   Specimen: Nasal Mucosa; Nasal Swab  Result Value Ref Range Status   MRSA by PCR Next Gen NOT DETECTED NOT DETECTED Final    Comment: (NOTE) The GeneXpert MRSA Assay (FDA approved for NASAL specimens only), is one component of a comprehensive MRSA colonization surveillance program. It is not intended to diagnose MRSA infection nor to guide or monitor treatment for MRSA infections. Test performance is not FDA approved in patients less than 32 years old. Performed at [facility], [address], [place], [place] [address]          Radiology Studies: No results found.      Scheduled Meds:  bisacodyl  5 mg Oral QHS   carisoprodol  175 mg Oral TID   dorzolamide-timolol  1 drop Left Eye BID   enoxaparin (LOVENOX) injection  40 mg Subcutaneous Q24H   estradiol  0.5 mg Oral QHS   feeding supplement  237 mL Oral BID BM   fluticasone  2 spray Each Nare Daily   fluticasone furoate-vilanterol  1 puff Inhalation Daily   And   umeclidinium bromide  1 puff Inhalation Daily   guaiFENesin  1,200 mg Oral BID   insulin aspart  0-15 Units Subcutaneous TID WC   insulin aspart  0-5 Units Subcutaneous QHS   levothyroxine  88 mcg Oral QAC breakfast   loratadine  10 mg Oral Daily   ondansetron  4 mg Intravenous Once   pantoprazole  40 mg Oral Daily   simvastatin  40 mg Oral QHS   sodium chloride flush  3 mL Intravenous Q12H   [START ON 03/13/2023] spironolactone  25 mg Oral Daily   traZODone  50 mg Oral QHS   triamcinolone cream   Topical BID   Continuous Infusions:  sodium chloride     piperacillin-tazobactam (ZOSYN)  IV 3.375 g (03/12/23 0505)   piperacillin-tazobactam (ZOSYN)  IV Stopped (03/08/23 2128)     LOS: 4 days    Time spent: 40 minutes    Ramiro Harvest, MD [facility]   To contact the  attending provider between 7A-7P or the covering provider during after hours 7P-7A, please log into the web site [email or web address] and access using universal [place] password for that web site. If you do not have the password, please call the hospital operator.  03/12/2023, 10:28 AM

## 2023-03-12 NOTE — Plan of Care (Signed)
°  Problem: Education: °Goal: Knowledge of General Education information will improve °Description: Including pain rating scale, medication(s)/side effects and non-pharmacologic comfort measures °Outcome: Progressing °  °Problem: Coping: °Goal: Level of anxiety will decrease °Outcome: Progressing °  °Problem: Elimination: °Goal: Will not experience complications related to bowel motility °Outcome: Progressing °  °Problem: Pain Managment: °Goal: General experience of comfort will improve °Outcome: Progressing °  °Problem: Safety: °Goal: Ability to remain free from injury will improve °Outcome: Progressing °  °Problem: Skin Integrity: °Goal: Risk for impaired skin integrity will decrease °Outcome: Progressing °  °

## 2023-03-12 NOTE — Evaluation (Signed)
Occupational Therapy Evaluation Patient Details Name: Joan [name] MRN: [id number] DOB: 22-Dec-1945 Today's Date: 03/12/2023   History of Present Illness Patient is a 77 year old female who presented to the emergency room after failure of outpatient antibiotic therapy. Patient was admitted with multifocal pneumonia, HTN, hypothyroidism. PMH: DM II, hypokalemia, COPD, R THA   Clinical Impression   Patient evaluated by Occupational Therapy with no further acute OT needs identified. All education has been completed and the patient has no further questions. Patient is MI in room for ADLs. Recommend for patient to keep working with mobility while in hospital.  See below for any follow-up Occupational Therapy or equipment needs. OT is signing off. Thank you for this referral.        If plan is discharge home, recommend the following: Assistance with cooking/housework       Equipment Recommendations  None recommended by OT       Precautions / Restrictions Restrictions Weight Bearing Restrictions: No      Mobility Bed Mobility Overal bed mobility: Modified Independent                  Transfers                          Balance Overall balance assessment: No apparent balance deficits (not formally assessed)                                         ADL either performed or assessed with clinical judgement   ADL Overall ADL's : Modified independent                                       General ADL Comments: patient was able to complete LB Dressing, transfers, bed mobility, toielting tasks,washing up at sink level  and functional mobility in room with cane with MI. patient reported she did not need occupational therapy. OT to sign off     Vision Baseline Vision/History: 1 Wears glasses              Pertinent Vitals/Pain Pain Assessment Pain Assessment: No/denies pain     Extremity/Trunk Assessment Upper Extremity  Assessment Upper Extremity Assessment: Overall WFL for tasks assessed   Lower Extremity Assessment Lower Extremity Assessment: Defer to PT evaluation   Cervical / Trunk Assessment Cervical / Trunk Assessment: Kyphotic   Communication     Cognition Arousal: Alert Behavior During Therapy: WFL for tasks assessed/performed Overall Cognitive Status: Within Functional Limits for tasks assessed                        Home Living Family/patient expects to be discharged to:: Private residence Living Arrangements: Alone Available Help at Discharge: Family;Available PRN/intermittently Type of Home: Apartment Home Access: Level entry     Home Layout: One level     Bathroom Shower/Tub: Tub/shower unit         Home Equipment: Grab bars - tub/shower;Grab bars - toilet          Prior Functioning/Environment Prior Level of Function : Independent/Modified Independent;Driving                        OT Problem List:  OT Treatment/Interventions:      OT Goals(Current goals can be found in the care plan section) Acute Rehab OT Goals OT Goal Formulation: All assessment and education complete, DC therapy  OT Frequency:         AM-PAC OT "6 Clicks" Daily Activity     Outcome Measure Help from another person eating meals?: None Help from another person taking care of personal grooming?: None Help from another person toileting, which includes using toliet, bedpan, or urinal?: None Help from another person bathing (including washing, rinsing, drying)?: None Help from another person to put on and taking off regular upper body clothing?: None Help from another person to put on and taking off regular lower body clothing?: None 6 Click Score: 24   End of Session Equipment Utilized During Treatment: Gait belt Nurse Communication: Mobility status  Activity Tolerance: Patient tolerated treatment well Patient left: in chair;with call bell/phone within reach  OT  Visit Diagnosis: Unsteadiness on feet (R26.81)                Time: 1610-9604 OT Time Calculation (min): 19 min Charges:  OT General Charges $OT Visit: 1 Visit OT Evaluation $OT Eval Low Complexity: 1 Low  Prisilla Kocsis OTR/L, MS Acute Rehabilitation Department Office# [phone]   Selinda Flavin 03/12/2023, 4:34 PM

## 2023-03-12 NOTE — Plan of Care (Signed)

## 2023-03-12 NOTE — Progress Notes (Signed)
PT Cancellation Note  Patient Details Name: Joan [name] MRN: [id number] DOB: May 07, 1946   Cancelled Treatment:    Reason Eval/Treat Not Completed:  Attempted PT eval-pt declined to participate at this time.    Faye Ramsay, PT Acute Rehabilitation  Office: [phone]

## 2023-03-13 ENCOUNTER — Ambulatory Visit (HOSPITAL_COMMUNITY): Payer: Medicare Other | Admitting: Physical Therapy

## 2023-03-13 DIAGNOSIS — F17210 Nicotine dependence, cigarettes, uncomplicated: Secondary | ICD-10-CM

## 2023-03-13 DIAGNOSIS — J189 Pneumonia, unspecified organism: Secondary | ICD-10-CM | POA: Diagnosis not present

## 2023-03-13 DIAGNOSIS — K219 Gastro-esophageal reflux disease without esophagitis: Secondary | ICD-10-CM

## 2023-03-13 DIAGNOSIS — I890 Lymphedema, not elsewhere classified: Secondary | ICD-10-CM

## 2023-03-13 DIAGNOSIS — E039 Hypothyroidism, unspecified: Secondary | ICD-10-CM

## 2023-03-13 DIAGNOSIS — F17200 Nicotine dependence, unspecified, uncomplicated: Secondary | ICD-10-CM

## 2023-03-13 LAB — CBC
HCT: 34.8 % — ABNORMAL LOW (ref 36.0–46.0)
Hemoglobin: 11.3 g/dL — ABNORMAL LOW (ref 12.0–15.0)
MCH: 29.4 pg (ref 26.0–34.0)
MCHC: 32.5 g/dL (ref 30.0–36.0)
MCV: 90.4 fL (ref 80.0–100.0)
Platelets: 247 10*3/uL (ref 150–400)
RBC: 3.85 MIL/uL — ABNORMAL LOW (ref 3.87–5.11)
RDW: 13.6 % (ref 11.5–15.5)
WBC: 5.8 10*3/uL (ref 4.0–10.5)
nRBC: 0 % (ref 0.0–0.2)

## 2023-03-13 LAB — CULTURE, BLOOD (ROUTINE X 2)
Culture: NO GROWTH
Culture: NO GROWTH
Special Requests: ADEQUATE

## 2023-03-13 LAB — BASIC METABOLIC PANEL
Anion gap: 11 (ref 5–15)
BUN: 9 mg/dL (ref 8–23)
CO2: 20 mmol/L — ABNORMAL LOW (ref 22–32)
Calcium: 7.8 mg/dL — ABNORMAL LOW (ref 8.9–10.3)
Chloride: 105 mmol/L (ref 98–111)
Creatinine, Ser: 0.63 mg/dL (ref 0.44–1.00)
GFR, Estimated: >60 mL/min (ref >60)
Glucose, Bld: 97 mg/dL (ref 70–99)
Potassium: 3.6 mmol/L (ref 3.5–5.1)
Sodium: 136 mmol/L (ref 135–145)

## 2023-03-13 LAB — GLUCOSE, CAPILLARY
Glucose-Capillary: 102 mg/dL — ABNORMAL HIGH (ref 70–99)
Glucose-Capillary: 167 mg/dL — ABNORMAL HIGH (ref 70–99)
Glucose-Capillary: 149 mg/dL — ABNORMAL HIGH (ref 70–99)
Glucose-Capillary: 163 mg/dL — ABNORMAL HIGH (ref 70–99)

## 2023-03-13 NOTE — Hospital Course (Addendum)
The patient is a 77 year old Caucasian female with a past medical history significant for bronchitis COPD as noted in the chart but denied by the patient as well as other history include hypothyroidism, hypertension, tobacco abuse, lymphedema, and other comorbidities who presented to the hospital with a new onset cough, weakness and fever.  She was in her usual state of health until about 02/18/2023 and started feeling unwell.  Patient underwent outpatient evaluation from her PCP and had a CT scan on 02/25/2023 which showed new airspace disease in the right upper lobe with persistent disabilities in the right middle lobe and was consistent with pneumonia.  She was initiated on doxycycline and took this for a few days however was unable to tolerate due to vomiting and nausea and change to levofloxacin and finished it a few days ago.  She came back to her PCP office and complained of ongoing extreme weakness, sensation of shortness of breath as well as reportedly vomiting at home and any chest pain but states that she did cough and have shortness of breath.  She also reported having subjective fever and felt extremely cold.  Given her concern with the primary care physician transfer the patient to the ED for further evaluation and admission for failure of outpatient antibiotic therapy she was initiated on IV Zosyn and has been transferred to the stepdown unit.  She has improved but did not feel well today. Will observe overnight and anticipate D/C in the next 24 hours.   Repeat CXR done this yesterday showed "Hypoinflation of the lungs with minimal bibasilar subsegmental atelectasis." Will need Ambulatory Home O2 screen and she is stable to discharge at this time and will need to follow-up with PCP and repeat chest x-ray in 3 to 6 weeks and continue oral antibiotics.   Assessment and Plan:  Multifocal Pneumonia, improving  -Patient noted to have failed outpatient therapy. -Patient initially diagnosed 02/18/2023  received 3 days of doxycycline which she could not tolerate and antibiotics changed to Levaquin which she completed in the outpatient setting however states was having bouts of nausea and emesis while on Levaquin. -Patient does state due to nausea and vomiting unable to tolerate doxycycline.  -On follow-up CT 02/25/2023 with new airspace disease noted in the right upper lobe with persistent airspace disease in the right middle lobe. -Patient presented to PCPs office on day of admission with generalized weakness, fatigue, shortness of breath, productive cough, subjective fevers and chills. -CT angiogram chest done with progression of right middle lobe opacification compared to CAT scan done 02/25/2023 and also showed "No evidence of pulmonary embolism to the proximal subsegmental pulmonary artery level.  Multifocal pneumonia with consolidation in the medial segment of the middle lobe. Innumerable subcentimeter sized noncalcified nodules throughout bilateral lungs which are nonspecific but favored to represent endobronchial spread of infection as well. Short-term follow-up examination in 3 months is recommended to document resolution." -Patient noted to have soft blood pressure which responded to IV fluids. -Patient pancultured, blood cultures obtained and showed no growth to date at 5 days -MRSA PCR negative. -SARS coronavirus 2 PCR negative. -SLP evaluation obtained and recommending Regular Diet with Thin Liquid with No follow up.  -Discontinued IV vancomycin.   -Continue with Albuterol 2.5 mg nebs every 6 as needed for wheezing or shortness of breath -Continue IV Zosyn through today and if continued improvement transition to oral cefdinir and azithromycin for discharge -Continue guaifenesin 1200 mg p.o. twice daily and will add flutter valve incentive spirometry; Does not  like Flutter Valve. -Repeat chest x-ray yesterday AM showed "Hypoinflation of the lungs with minimal bibasilar subsegmental  atelectasis." -Repeat chest x-ray this a.m. done and showed "Stable radiographic appearance of the chest. Low lung volumes with right greater than left bibasilar opacities, favored to represent atelectasis." -Follow-up with PCP and repeat chest x-ray in 3 to 6 weeks and continue oral antibiotics for duration   Hypertension -BP was soft yesterday morning.   -Discontinue Lasix and placed on half home dose spironolactone.  She is now on 25 mg p.o. daily of spironolactone and will need to continue monitor blood pressures per protocol and can resume home dose medications -Last blood pressure reading was 126/61   Tobacco Abuse COPD -Currently not in exacerbation we will continue Breo Ellipta plus Incruse Ellipta as well as fluticasone 2 sprays each needed daily -Continue with guaifenesin 1200 mL p.o. twice daily -SpO2: 97 %; continue to monitor respiratory status carefully -Tobacco cessation and counseling given   GERD/GI Prophylaxis -Continue PPI with Pantoprazole 40 mg p.o. daily  Hyperlipidemia -Continue Simvastatin 40 mg p.o. daily   Diabetes Mellitus Type 2 -Hemoglobin A1c 7.1 (02/18/2023) -Continue to hold oral hypoglycemic agents. -Continue with moderate NovoLog size scale insulin before meals and at bedtime -Continue monitor CBGs per protocol and blood glucose per protocol alcohol CBG and glucose trend: Recent Labs  Lab 03/13/23 2059 03/14/23 0726 03/14/23 1221 03/14/23 1634 03/14/23 2051 03/15/23 0740 03/15/23 1131  GLUCAP 163* 107* 167* 144* 143* 117* 154*   Recent Labs  Lab 03/09/23 0311 03/10/23 0312 03/11/23 0402 03/12/23 0419 03/13/23 0359 03/14/23 0403 03/15/23 0435  GLUCOSE 117* 110* 124* 137* 97 112* 154*    Lower extremity edema/lymphedema -Patient states on Lasix and spironolactone for lymphedema. -Lower extremities wrapped and gets them wrapped every other day -Patient with soft borderline blood pressure and as such discontinue Lasix and placed on half  home dose spironolactone while hospitalized but since she is improved can resume her home Lasix and spironolactone -Continue to monitor blood pressures per protocol; follow-up with PCP within 1 to 2 weeks  Hypothyroidism -Continue Levothyroxine 88 mcg p.o. daily before breakfast -TSH was 0.343 -Repeat TFTs in 4-6 weeks   Hypokalemia -Patient's K+ Level Trend: Recent Labs  Lab 03/09/23 0311 03/10/23 0312 03/11/23 0402 03/12/23 0419 03/13/23 0359 03/14/23 0403 03/15/23 0435  K 3.7 3.4* 3.7 3.9 3.6 3.3* 4.0  -Replete with po Kcl 40 mEQ BID x2 yesterday -Continue to Monitor and Replete as Necessary -Repeat CMP within 1 week   ??  IV infiltration right upper extremity/??  Superficial thrombophlebitis -Warm compresses 4 times daily. -Keep right upper extremity elevated. -Continue with ibuprofen 400 mg p.o. daily x 5 days -Supportive care and continue monitoring outpatient setting  Metabolic Acidosis, stable -Mild. Patient's CO2 is now 21, anion gap is now 10, chloride level is 105 -Continue to monitor and trend and repeat CMP within 1 week  Normocytic Anemia -Hgb/Hct Trend: Recent Labs  Lab 03/09/23 0311 03/10/23 0312 03/11/23 0402 03/12/23 0419 03/13/23 0359 03/14/23 0403 03/15/23 0435  HGB 11.6* 10.8* 10.6* 11.8* 11.3* 11.7* 12.1  HCT 35.3* 33.2* 32.1* 36.3 34.8* 36.0 37.1  MCV 92.4 91.7 91.5 93.1 90.4 91.1 92.3  -Checked Anemia Panel and showed an iron level of 48, UIBC of 259, TIBC 307, saturation ratios of 16%, ferritin level of 149, folate level 6.1, vitamin B12 325 -Continue to Monitor for S/Sx of Bleeding; no overt bleeding noted -Repeat CBC in the AM    Hypoalbuminemia -Patient's  Albumin Trend: Recent Labs  Lab 02/18/23 1515 03/08/23 1411 03/14/23 0403 03/15/23 0435  ALBUMIN 4.1 3.4* 2.8* 2.9*  -Continue to Monitor and Trend and repeat CMP in the AM  Obesity -Complicates overall prognosis and care -Estimated body mass index is 32.29 kg/m as  calculated from the following:   Height as of this encounter: 5' (1.524 m).   Weight as of this encounter: 75 kg.  -Weight Loss and Dietary Counseling given

## 2023-03-13 NOTE — Progress Notes (Signed)
Mobility Specialist - Progress Note   03/13/23 1410  Mobility  Activity Ambulated with assistance in hallway  Level of Assistance Standby assist, set-up cues, supervision of patient - no hands on  Assistive Device Cane  Distance Ambulated (ft) 200 ft  Range of Motion/Exercises Active  Activity Response Tolerated well  Mobility Referral Yes  $Mobility charge 1 Mobility  Mobility Specialist Start Time (ACUTE ONLY) 1400  Mobility Specialist Stop Time (ACUTE ONLY) 1410  Mobility Specialist Time Calculation (min) (ACUTE ONLY) 10 min   Pt received in chair and agreed to mobility. Had no issues throughout session, returned to bed with all needs met. Alarm on.  Marilynne Halsted Mobility Specialist

## 2023-03-13 NOTE — Plan of Care (Signed)
  Problem: Education: Goal: Knowledge of General Education information will improve Description: Including pain rating scale, medication(s)/side effects and non-pharmacologic comfort measures Outcome: Progressing   Problem: Clinical Measurements: Goal: Ability to maintain clinical measurements within normal limits will improve Outcome: Progressing Goal: Diagnostic test results will improve Outcome: Progressing Goal: Cardiovascular complication will be avoided Outcome: Progressing   Problem: Nutrition: Goal: Adequate nutrition will be maintained Outcome: Progressing   Problem: Coping: Goal: Level of anxiety will decrease Outcome: Progressing   Problem: Pain Managment: Goal: General experience of comfort will improve Outcome: Progressing

## 2023-03-13 NOTE — Plan of Care (Signed)

## 2023-03-13 NOTE — Evaluation (Signed)
Physical Therapy Evaluation Patient Details Name: Joan [name] MRN: [id number] DOB: [birth year] Today's Date: 03/13/2023  History of Present Illness  Patient is a 77 year old female who presented to the emergency room after failure of outpatient antibiotic therapy. Patient was admitted with multifocal pneumonia, HTN, hypothyroidism. PMH: DM II, hypokalemia, COPD, R THA  Clinical Impression  Pt admitted with above diagnosis.  Pt currently with functional limitations due to the deficits listed below (see PT Problem List). Pt will benefit from acute skilled PT to increase their independence and safety with mobility to allow discharge.     The patient is mobilizing with [facility] in her room, Patient eager to increase ambulation later today.  Patient should progress to return home.       If plan is discharge home, recommend the following: Help with stairs or ramp for entrance;Assist for transportation;Assistance with cooking/housework   Can travel by private vehicle        Equipment Recommendations None recommended by PT  Recommendations for Other Services       Functional Status Assessment Patient has had a recent decline in their functional status and demonstrates the ability to make significant improvements in function in a reasonable and predictable amount of time.     Precautions / Restrictions Precautions Precautions: Fall Restrictions Weight Bearing Restrictions: No      Mobility  Bed Mobility Overal bed mobility: Modified Independent                  Transfers Overall transfer level: Modified independent Equipment used: Straight cane                    Ambulation/Gait Ambulation/Gait assistance: Supervision Gait Distance (Feet): 20 Feet (X 2) Assistive device: Straight cane Gait Pattern/deviations: Step-to pattern, Trunk flexed Gait velocity: decr     General Gait Details: GAIT SLOW BUT STEADY WITH spc, DELINE rw  Stairs             Wheelchair Mobility     Tilt Bed    Modified Rankin (Stroke Patients Only)       Balance Overall balance assessment: No apparent balance deficits (not formally assessed)                                           Pertinent Vitals/Pain Pain Assessment Pain Assessment: No/denies pain    Home Living Family/patient expects to be discharged to:: Private residence Living Arrangements: Alone Available Help at Discharge: Family;Available PRN/intermittently Type of Home: Apartment Home Access: Level entry       Home Layout: One level Home Equipment: Grab bars - tub/shower;Grab bars - toilet      Prior Function Prior Level of Function : Independent/Modified Independent;Driving                     Extremity/Trunk Assessment   Upper Extremity Assessment Upper Extremity Assessment: Overall WFL for tasks assessed    Lower Extremity Assessment Lower Extremity Assessment: Generalized weakness    Cervical / Trunk Assessment Cervical / Trunk Assessment: Kyphotic  Communication   Communication Communication: No apparent difficulties  Cognition Arousal: Alert Behavior During Therapy: WFL for tasks assessed/performed  General Comments      Exercises     Assessment/Plan    PT Assessment Patient needs continued PT services  PT Problem List Decreased mobility;Decreased activity tolerance       PT Treatment Interventions DME instruction;Therapeutic activities;Gait training;Functional mobility training    PT Goals (Current goals can be found in the Care Plan section)  Acute Rehab PT Goals Patient Stated Goal: walk more PT Goal Formulation: With patient Time For Goal Achievement: 03/27/23 Potential to Achieve Goals: Good    Frequency Min 1X/week     Co-evaluation               AM-PAC PT "6 Clicks" Mobility  Outcome Measure Help needed turning from your back to your  side while in a flat bed without using bedrails?: None Help needed moving from lying on your back to sitting on the side of a flat bed without using bedrails?: None Help needed moving to and from a bed to a chair (including a wheelchair)?: None Help needed standing up from a chair using your arms (e.g., wheelchair or bedside chair)?: None Help needed to walk in hospital room?: A Little Help needed climbing 3-5 steps with a railing? : A Little 6 Click Score: 22    End of Session   Activity Tolerance: Patient tolerated treatment well Patient left: in bed;with call bell/phone within reach;with bed alarm set Nurse Communication: Mobility status PT Visit Diagnosis: Unsteadiness on feet (R26.81)    Time: 1610-9604 PT Time Calculation (min) (ACUTE ONLY): 16 min   Charges:   PT Evaluation $PT Eval Low Complexity: 1 Low   PT General Charges $$ ACUTE PT VISIT: 1 Visit         Blanchard Kelch PT Acute Rehabilitation Services Office [phone] Weekend pager-[phone]   Rada Hay 03/13/2023, 11:08 AM

## 2023-03-13 NOTE — Progress Notes (Signed)
PROGRESS NOTE    Joan [name]  ZHY:[id number] DOB: 05-Aug-1945 DOA: 03/08/2023 PCP: Donato Schultz, DO   Brief Narrative:  The patient is a 77 year old Caucasian female with a past medical history significant for bronchitis COPD as noted in the chart but denied by the patient as well as other history include hypothyroidism, hypertension, tobacco abuse, lymphedema, and other comorbidities who presented to the hospital with a new onset cough, weakness and fever.  She was in her usual state of health until about 02/18/2023 and started feeling unwell.  Patient underwent outpatient evaluation from her PCP and had a CT scan on 02/25/2023 which showed new airspace disease in the right upper lobe with persistent disabilities in the right middle lobe and was consistent with pneumonia.  She was initiated on doxycycline and took this for a few days however was unable to tolerate due to vomiting and nausea and change to levofloxacin and finished it a few days ago.  She came back to her PCP office and complained of ongoing extreme weakness, sensation of shortness of breath as well as reportedly vomiting at home and any chest pain but states that she did cough and have shortness of breath.  She also reported having subjective fever and felt extremely cold.  Given her concern with the primary care physician transfer the patient to the ED for further evaluation and admission for failure of outpatient antibiotic therapy she was initiated on IV Zosyn and has been transferred to the stepdown unit.  She is now further improved and on the progressive care for still but anticipating discharging in the next 24 to 48 hours given her improvement.  Assessment and Plan:  Multifocal pneumonia -Patient noted to have failed outpatient therapy. -Patient initially diagnosed 02/18/2023 received 3 days of doxycycline which she could not tolerate and antibiotics changed to Levaquin which she completed in the outpatient setting  however states was having bouts of nausea and emesis while on Levaquin. -Patient does state due to nausea and vomiting unable to tolerate doxycycline.  -On follow-up CT 02/25/2023 with new airspace disease noted in the right upper lobe with persistent airspace disease in the right middle lobe. -Patient presented to PCPs office on day of admission with generalized weakness, fatigue, shortness of breath, productive cough, subjective fevers and chills. -CT angiogram chest done with progression of right middle lobe opacification compared to CAT scan done 02/25/2023 and also showed "No evidence of pulmonary embolism to the proximal subsegmental pulmonary artery level.  Multifocal pneumonia with consolidation in the medial segment of the middle lobe. Innumerable subcentimeter sized noncalcified nodules throughout bilateral lungs which are nonspecific but favored to represent endobronchial spread of infection as well. Short-term follow-up examination in 3 months is recommended to document resolution." -Patient noted to have soft blood pressure which responded to IV fluids. -Patient pancultured, blood cultures obtained and showed no growth to date at 5 days -MRSA PCR negative. -SARS coronavirus 2 PCR negative. -SLP evaluation.   -Discontinued IV vancomycin.  -Status post azithromycin.   -Continue with Albuterol 2.5 mg nebs every 6 as needed for wheezing or shortness of breath -Continue IV Zosyn through today and if continued improvement transition to Augmentin tomorrow with likely discharge and ambulatory home O2 screen -Continue guaifenesin 1200 mg p.o. twice daily and will add flutter valve incentive spirometry -Repeat chest x-ray in the a.m. patient will need an ambulatory home O2 screen   Hypertension -BP was soft yesterday morning.   -Discontinue Lasix and placed on half  home dose spironolactone.  She is now on 25 mg p.o. daily of spironolactone and will need to continue monitor blood pressures per  protocol -Last blood pressure reading was 124/88   Tobacco Abuse COPD -Currently not in exacerbation we will continue Breo Ellipta plus Incruse Ellipta as well as fluticasone 2 sprays each needed daily -Continue with guaifenesin 1200 mL p.o. twice daily -SpO2: 98 %; continue to monitor respiratory status carefully -Tobacco cessation and counseling given   GERD/GI Prophylaxis -Continue PPI with Pantoprazole 40 mg p.o. daily  Hyperlipidemia -Continue simvastatin 40 mg p.o. daily   Diabetes mellitus type 2 -Hemoglobin A1c 7.1 (02/18/2023) -Continue to hold oral hypoglycemic agents. -Continue with moderate NovoLog size scale insulin before meals and at bedtime -Continue monitor CBGs per protocol and blood glucose per protocol alcohol CBG and glucose trend: Recent Labs  Lab 03/11/23 2218 03/12/23 0728 03/12/23 1127 03/12/23 1658 03/12/23 2106 03/13/23 0738 03/13/23 1142  GLUCAP 177* 114* 149* 166* 120* 102* 167*   Recent Labs  Lab 02/18/23 1515 03/08/23 1411 03/09/23 0311 03/10/23 0312 03/11/23 0402 03/12/23 0419 03/13/23 0359  GLUCOSE 133* 157* 117* 110* 124* 137* 97    Lower extremity edema/lymphedema -Patient states on Lasix and spironolactone for lymphedema. -Lower extremities wrapped and gets them wrapped every other day -Patient with soft borderline blood pressure and as such discontinue Lasix and placed on half home dose spironolactone. -Continue to monitor blood pressures per protocol; last blood pressure reading was  Hypothyroidism -Continue Levothyroxine 88 mcg p.o. daily before breakfast   Hypokalemia -Patient's K+ Level Trend: Recent Labs  Lab 02/18/23 1515 03/08/23 1411 03/09/23 0311 03/10/23 0312 03/11/23 0402 03/12/23 0419 03/13/23 0359  K 4.7 3.8 3.7 3.4* 3.7 3.9 3.6  -Continue to Monitor and Replete as Necessary -Repeat CMP in the AM    ??  IV infiltration right upper extremity/??  Superficial thrombophlebitis -Warm compresses 4 times  daily. -Keep right upper extremity elevated. -Continue with ibuprofen 400 mg p.o. daily x 5 days -Supportive care.  Metabolic Acidosis -Mild.  Patient's CO2 is now 20, anion gap is now 11, chloride level was 105 -Continue to monitor and trend and repeat CMP in the a.m.  Normocytic Anemia -Hgb/Hct Trend: Recent Labs  Lab 02/18/23 1515 03/08/23 1411 03/09/23 0311 03/10/23 0312 03/11/23 0402 03/12/23 0419 03/13/23 0359  HGB 13.4 12.6 11.6* 10.8* 10.6* 11.8* 11.3*  HCT 40.3 37.7 35.3* 33.2* 32.1* 36.3 34.8*  MCV 91.2 89.5 92.4 91.7 91.5 93.1 90.4  -Check Anemia Panel in the AM -Continue to Monitor for S/Sx of Bleeding; no overt bleeding noted -Repeat CBC in the AM    Hypoalbuminemia -Patient's Albumin Trend: Recent Labs  Lab 02/18/23 1515 03/08/23 1411  ALBUMIN 4.1 3.4*  -Continue to Monitor and Trend and repeat CMP in the AM  Obesity -Complicates overall prognosis and care -Estimated body mass index is 32.29 kg/m as calculated from the following:   Height as of this encounter: 5' (1.524 m).   Weight as of this encounter: 75 kg.  -Weight Loss and Dietary Counseling given   DVT prophylaxis: enoxaparin (LOVENOX) injection 40 mg Start: 03/09/23 0800 SCDs Start: 03/08/23 2024    Code Status: Full Code Family Communication: No family present at bedside   Disposition Plan:  Level of care: Progressive Status is: Inpatient Remains inpatient appropriate because: There is further clinical improvement and repeat chest x-ray and ambulatory almost to screen prior to discharge   Consultants:  None  Procedures:  As delineated as above  Antimicrobials:  Anti-infectives (From admission, onward)    Start     Dose/Rate Route Frequency Ordered Stop   03/10/23 0600  vancomycin (VANCOCIN) IVPB 1000 mg/200 mL premix  Status:  Discontinued        1,000 mg 200 mL/hr over 60 Minutes Intravenous Every 36 hours 03/08/23 2126 03/09/23 1422   03/09/23 1800  vancomycin (VANCOREADY)  IVPB 750 mg/150 mL  Status:  Discontinued        750 mg 150 mL/hr over 60 Minutes Intravenous Every 24 hours 03/09/23 1422 03/10/23 0801   03/08/23 2200  azithromycin (ZITHROMAX) 500 mg in sodium chloride 0.9 % 250 mL IVPB        500 mg 250 mL/hr over 60 Minutes Intravenous Every 24 hours 03/08/23 2056 03/11/23 0231   03/08/23 2200  piperacillin-tazobactam (ZOSYN) IVPB 3.375 g        3.375 g 12.5 mL/hr over 240 Minutes Intravenous Every 8 hours 03/08/23 2103     03/08/23 2200  piperacillin-tazobactam (ZOSYN) IVPB 3.375 g        3.375 g 12.5 mL/hr over 240 Minutes Intravenous  Once 03/08/23 2110     03/08/23 1715  piperacillin-tazobactam (ZOSYN) IVPB 3.375 g  Status:  Discontinued        3.375 g 12.5 mL/hr over 240 Minutes Intravenous  Once 03/08/23 1714 03/08/23 2200   03/08/23 1715  vancomycin (VANCOCIN) IVPB 1000 mg/200 mL premix  Status:  Discontinued        1,000 mg 200 mL/hr over 60 Minutes Intravenous  Once 03/08/23 1714 03/08/23 2001       Subjective: Seen and examined at bedside and states that she has not yet close to her baseline.  Still feels extremely weak and still having a difficult time coughing up some sputum.  No nausea or vomiting.  Feels okay otherwise.  She feels weak as well.  No other concerns or complaints this time.  Objective: Vitals:   03/12/23 0627 03/12/23 1302 03/12/23 2103 03/13/23 1300  BP: 108/68 (!) 111/51 (!) 122/49 124/88  Pulse: 63 68 (!) 58 65  Resp: 17 16 18 20   Temp: 98.3 F (36.8 C) 98.5 F (36.9 C) 97.6 F (36.4 C) 98 F (36.7 C)  TempSrc: Oral Oral  Oral  SpO2: 95% 95% 97% 98%  Weight:      Height:        Intake/Output Summary (Last 24 hours) at 03/13/2023 1703 Last data filed at 03/13/2023 1610 Gross per 24 hour  Intake 132.81 ml  Output --  Net 132.81 ml   Filed Weights   03/08/23 1342 03/08/23 1918  Weight: 75 kg 75 kg   Examination: Physical Exam:  Constitutional: WN/WD obese Caucasian elderly female in no acute  distress Respiratory: Diminished to auscultation bilaterally with some coarse breath sounds worse on the right compared to left and has some rhonchi but no appreciable wheezing or rales or crackles. Normal respiratory effort and patient is not tachypenic. No accessory muscle use.  Unlabored breathing  Cardiovascular: RRR, no murmurs / rubs / gallops. S1 and S2 auscultated. No extremity edema.  Abdomen: Soft, non-tender, non-distended. No masses palpated. No appreciable hepatosplenomegaly. Bowel sounds positive.  GU: Deferred. Musculoskeletal: No clubbing / cyanosis of digits/nails. No joint deformity upper and lower extremities.  Skin: No rashes, lesions, ulcers on limited skin evaluation. No induration; Warm and dry.  Neurologic: CN 2-12 grossly intact with no focal deficits. Romberg sign and cerebellar reflexes not assessed.  Psychiatric: Normal judgment and insight.  Alert and oriented x 3. Normal mood and appropriate affect.   Data Reviewed: I have personally reviewed following labs and imaging studies  CBC: Recent Labs  Lab 03/08/23 1411 03/09/23 0311 03/10/23 0312 03/11/23 0402 03/12/23 0419 03/13/23 0359  WBC 10.3 7.9 6.4 6.3 6.5 5.8  NEUTROABS 7.2  --  3.6  --   --   --   HGB 12.6 11.6* 10.8* 10.6* 11.8* 11.3*  HCT 37.7 35.3* 33.2* 32.1* 36.3 34.8*  MCV 89.5 92.4 91.7 91.5 93.1 90.4  PLT 203 189 186 205 243 247   Basic Metabolic Panel: Recent Labs  Lab 03/09/23 0311 03/10/23 0312 03/11/23 0402 03/12/23 0419 03/13/23 0359  NA 132* 133* 134* 135 136  K 3.7 3.4* 3.7 3.9 3.6  CL 100 101 105 106 105  CO2 21* 23 21* 21* 20*  GLUCOSE 117* 110* 124* 137* 97  BUN 10 10 9 8 9   CREATININE 0.69 0.70 0.62 0.67 0.63  CALCIUM 7.7* 7.6* 7.3* 7.7* 7.8*  MG  --  2.2  --   --   --    GFR: Estimated Creatinine Clearance: 52.4 mL/min (by C-G formula based on SCr of 0.63 mg/dL). Liver Function Tests: Recent Labs  Lab 03/08/23 1411  AST 23  ALT 13  ALKPHOS 97  BILITOT 0.4   PROT 6.8  ALBUMIN 3.4*   No results for input(s): "LIPASE", "AMYLASE" in the last 168 hours. No results for input(s): "AMMONIA" in the last 168 hours. Coagulation Profile: Recent Labs  Lab 03/09/23 0311  INR 1.1   Cardiac Enzymes: No results for input(s): "CKTOTAL", "CKMB", "CKMBINDEX", "TROPONINI" in the last 168 hours. BNP (last 3 results) No results for input(s): "PROBNP" in the last 8760 hours. HbA1C: No results for input(s): "HGBA1C" in the last 72 hours. CBG: Recent Labs  Lab 03/12/23 1658 03/12/23 2106 03/13/23 0738 03/13/23 1142 03/13/23 1657  GLUCAP 166* 120* 102* 167* 149*   Lipid Profile: No results for input(s): "CHOL", "HDL", "LDLCALC", "TRIG", "CHOLHDL", "LDLDIRECT" in the last 72 hours. Thyroid Function Tests: No results for input(s): "TSH", "T4TOTAL", "FREET4", "T3FREE", "THYROIDAB" in the last 72 hours. Anemia Panel: No results for input(s): "VITAMINB12", "FOLATE", "FERRITIN", "TIBC", "IRON", "RETICCTPCT" in the last 72 hours. Sepsis Labs: Recent Labs  Lab 03/08/23 1429  LATICACIDVEN 1.9    Recent Results (from the past 240 hour(s))  SARS Coronavirus 2 by RT PCR (hospital order, performed in [facility] hospital lab) *cepheid single result test* Urine, Clean Catch     Status: None   Collection Time: 03/08/23  1:46 PM   Specimen: Urine, Clean Catch; Nasal Swab  Result Value Ref Range Status   SARS Coronavirus 2 by RT PCR NEGATIVE NEGATIVE Final    Comment: (NOTE) SARS-CoV-2 target nucleic acids are NOT DETECTED.  The SARS-CoV-2 RNA is generally detectable in upper and lower respiratory specimens during the acute phase of infection. The lowest concentration of SARS-CoV-2 viral copies this assay can detect is 250 copies / mL. A negative result does not preclude SARS-CoV-2 infection and should not be used as the sole basis for treatment or other patient management decisions.  A negative result may occur with improper specimen collection /  handling, submission of specimen other than nasopharyngeal swab, presence of viral mutation(s) within the areas targeted by this assay, and inadequate number of viral copies (<250 copies / mL). A negative result must be combined with clinical observations, patient history, and epidemiological information.  Fact Sheet for Patients:   RoadLapTop.co.za  Fact Sheet for Healthcare Providers: http://kim-miller.com/  This test is not yet approved or  cleared by the [place] FDA and has been authorized for detection and/or diagnosis of SARS-CoV-2 by FDA under an Emergency Use Authorization (EUA).  This EUA will remain in effect (meaning this test can be used) for the duration of the COVID-19 declaration under Section 564(b)(1) of the Act, 21 U.S.C. section 360bbb-3(b)(1), unless the authorization is terminated or revoked sooner.  Performed at [facility], [address] [place], [place] [address]   Blood culture (routine x 2)     Status: None   Collection Time: 03/08/23  2:18 PM   Specimen: BLOOD  Result Value Ref Range Status   Specimen Description   Final    BLOOD RIGHT ANTECUBITAL Performed at [facility], [address] [place], [place] [address]    Special Requests   Final    BOTTLES DRAWN AEROBIC AND ANAEROBIC Blood Culture adequate volume Performed at [facility], [address] [place], [place] [address]    Culture   Final    NO GROWTH 5 DAYS Performed at [facility], [address], [place], [place] [address]    Report Status 03/13/2023 FINAL  Final  Blood culture (routine x 2)     Status: None   Collection Time: 03/08/23  2:28 PM   Specimen: BLOOD  Result Value Ref Range Status   Specimen Description   Final    BLOOD BLOOD RIGHT HAND Performed at [facility], [address] [place], [place] [address]    Special Requests   Final    BOTTLES DRAWN AEROBIC AND  ANAEROBIC Blood Culture results may not be optimal due to an inadequate volume of blood received in culture bottles Performed at [facility], [address] [place], [place] [address]    Culture   Final    NO GROWTH 5 DAYS Performed at [facility], [address], [place], [place] [address]    Report Status 03/13/2023 FINAL  Final  MRSA Next Gen by PCR, Nasal     Status: None   Collection Time: 03/08/23  7:36 PM   Specimen: Nasal Mucosa; Nasal Swab  Result Value Ref Range Status   MRSA by PCR Next Gen NOT DETECTED NOT DETECTED Final    Comment: (NOTE) The GeneXpert MRSA Assay (FDA approved for NASAL specimens only), is one component of a comprehensive MRSA colonization surveillance program. It is not intended to diagnose MRSA infection nor to guide or monitor treatment for MRSA infections. Test performance is not FDA approved in patients less than 67 years old. Performed at [facility], [address] [place], [place] [address]     Radiology Studies: No results found.  Scheduled Meds:  bisacodyl  5 mg Oral QHS   carisoprodol  175 mg Oral TID   dorzolamide-timolol  1 drop Left Eye BID   enoxaparin (LOVENOX) injection  40 mg Subcutaneous Q24H   estradiol  0.5 mg Oral QHS   feeding supplement  237 mL Oral BID BM   fluticasone  2 spray Each Nare Daily   fluticasone furoate-vilanterol  1 puff Inhalation Daily   And   umeclidinium bromide  1 puff Inhalation Daily   guaiFENesin  1,200 mg Oral BID   ibuprofen  400 mg Oral Q1200   insulin aspart  0-15 Units Subcutaneous TID WC   insulin  aspart  0-5 Units Subcutaneous QHS   levothyroxine  88 mcg Oral QAC breakfast   ondansetron  4 mg Intravenous Once   pantoprazole  40 mg Oral Daily   simvastatin  40 mg Oral QHS   sodium chloride flush  3 mL Intravenous Q12H   spironolactone  25 mg Oral Daily   traZODone  50 mg Oral QHS   triamcinolone cream   Topical BID   Continuous Infusions:   sodium chloride     piperacillin-tazobactam (ZOSYN)  IV 3.375 g (03/13/23 1215)   piperacillin-tazobactam (ZOSYN)  IV Stopped (03/08/23 2128)    LOS: 5 days   Marguerita Merles, DO [facility] Available via Epic secure chat 7am-7pm After these hours, please refer to coverage provider listed on [email or web address] 03/13/2023, 5:03 PM

## 2023-03-14 ENCOUNTER — Inpatient Hospital Stay (HOSPITAL_COMMUNITY): Payer: Medicare Other

## 2023-03-14 DIAGNOSIS — F17210 Nicotine dependence, cigarettes, uncomplicated: Secondary | ICD-10-CM | POA: Diagnosis not present

## 2023-03-14 DIAGNOSIS — R600 Localized edema: Secondary | ICD-10-CM

## 2023-03-14 DIAGNOSIS — K219 Gastro-esophageal reflux disease without esophagitis: Secondary | ICD-10-CM | POA: Diagnosis not present

## 2023-03-14 DIAGNOSIS — E039 Hypothyroidism, unspecified: Secondary | ICD-10-CM | POA: Diagnosis not present

## 2023-03-14 DIAGNOSIS — J189 Pneumonia, unspecified organism: Secondary | ICD-10-CM | POA: Diagnosis not present

## 2023-03-14 LAB — COMPREHENSIVE METABOLIC PANEL
ALT: 17 U/L (ref 0–44)
AST: 24 U/L (ref 15–41)
Albumin: 2.8 g/dL — ABNORMAL LOW (ref 3.5–5.0)
Alkaline Phosphatase: 72 U/L (ref 38–126)
Anion gap: 11 (ref 5–15)
BUN: 11 mg/dL (ref 8–23)
CO2: 21 mmol/L — ABNORMAL LOW (ref 22–32)
Calcium: 7.9 mg/dL — ABNORMAL LOW (ref 8.9–10.3)
Chloride: 101 mmol/L (ref 98–111)
Creatinine, Ser: 0.54 mg/dL (ref 0.44–1.00)
GFR, Estimated: >60 mL/min (ref >60)
Glucose, Bld: 112 mg/dL — ABNORMAL HIGH (ref 70–99)
Potassium: 3.3 mmol/L — ABNORMAL LOW (ref 3.5–5.1)
Sodium: 133 mmol/L — ABNORMAL LOW (ref 135–145)
Total Bilirubin: 0.5 mg/dL (ref 0.3–1.2)
Total Protein: 6.4 g/dL — ABNORMAL LOW (ref 6.5–8.1)

## 2023-03-14 LAB — CBC WITH DIFFERENTIAL/PLATELET
Abs Immature Granulocytes: 0.27 10*3/uL — ABNORMAL HIGH (ref 0.00–0.07)
Basophils Absolute: 0.1 10*3/uL (ref 0.0–0.1)
Basophils Relative: 1 %
Eosinophils Absolute: 0.1 10*3/uL (ref 0.0–0.5)
Eosinophils Relative: 2 %
HCT: 36 % (ref 36.0–46.0)
Hemoglobin: 11.7 g/dL — ABNORMAL LOW (ref 12.0–15.0)
Immature Granulocytes: 4 %
Lymphocytes Relative: 22 %
Lymphs Abs: 1.4 10*3/uL (ref 0.7–4.0)
MCH: 29.6 pg (ref 26.0–34.0)
MCHC: 32.5 g/dL (ref 30.0–36.0)
MCV: 91.1 fL (ref 80.0–100.0)
Monocytes Absolute: 0.8 10*3/uL (ref 0.1–1.0)
Monocytes Relative: 13 %
Neutro Abs: 3.5 10*3/uL (ref 1.7–7.7)
Neutrophils Relative %: 58 %
Platelets: 283 10*3/uL (ref 150–400)
RBC: 3.95 MIL/uL (ref 3.87–5.11)
RDW: 13.6 % (ref 11.5–15.5)
WBC: 6.2 10*3/uL (ref 4.0–10.5)
nRBC: 0 % (ref 0.0–0.2)

## 2023-03-14 LAB — GLUCOSE, CAPILLARY
Glucose-Capillary: 107 mg/dL — ABNORMAL HIGH (ref 70–99)
Glucose-Capillary: 167 mg/dL — ABNORMAL HIGH (ref 70–99)
Glucose-Capillary: 144 mg/dL — ABNORMAL HIGH (ref 70–99)
Glucose-Capillary: 143 mg/dL — ABNORMAL HIGH (ref 70–99)

## 2023-03-14 LAB — PHOSPHORUS: Phosphorus: 3.7 mg/dL (ref 2.5–4.6)

## 2023-03-14 LAB — MAGNESIUM: Magnesium: 2.2 mg/dL (ref 1.7–2.4)

## 2023-03-14 MED ORDER — POTASSIUM CHLORIDE CRYS ER 20 MEQ PO TBCR
40.0000 meq | EXTENDED_RELEASE_TABLET | Freq: Two times a day (BID) | ORAL | Status: AC
  Administered 2023-03-14 (×2): 40 meq via ORAL
  Filled 2023-03-14 (×2): qty 2

## 2023-03-14 NOTE — Progress Notes (Signed)
PROGRESS NOTE    Joan [name]  WGN:[id number] DOB: 1945-12-21 DOA: 03/08/2023 PCP: Donato Schultz, DO   Brief Narrative:  The patient is a 77 year old Caucasian female with a past medical history significant for bronchitis COPD as noted in the chart but denied by the patient as well as other history include hypothyroidism, hypertension, tobacco abuse, lymphedema, and other comorbidities who presented to the hospital with a new onset cough, weakness and fever.  She was in her usual state of health until about 02/18/2023 and started feeling unwell.  Patient underwent outpatient evaluation from her PCP and had a CT scan on 02/25/2023 which showed new airspace disease in the right upper lobe with persistent disabilities in the right middle lobe and was consistent with pneumonia.  She was initiated on doxycycline and took this for a few days however was unable to tolerate due to vomiting and nausea and change to levofloxacin and finished it a few days ago.  She came back to her PCP office and complained of ongoing extreme weakness, sensation of shortness of breath as well as reportedly vomiting at home and any chest pain but states that she did cough and have shortness of breath.  She also reported having subjective fever and felt extremely cold.  Given her concern with the primary care physician transfer the patient to the ED for further evaluation and admission for failure of outpatient antibiotic therapy she was initiated on IV Zosyn and has been transferred to the stepdown unit.  She has improved but did not feel well today. Will observe overnight and anticipate D/C in the next 24 hours.   Repeat CXR done this AM showed "Hypoinflation of the lungs with minimal bibasilar subsegmental atelectasis." Will need Ambulatory Home O2 screen prior to D/C.   Assessment and Plan:  Multifocal Pneumonia, improving  -Patient noted to have failed outpatient therapy. -Patient initially diagnosed 02/18/2023  received 3 days of doxycycline which she could not tolerate and antibiotics changed to Levaquin which she completed in the outpatient setting however states was having bouts of nausea and emesis while on Levaquin. -Patient does state due to nausea and vomiting unable to tolerate doxycycline.  -On follow-up CT 02/25/2023 with new airspace disease noted in the right upper lobe with persistent airspace disease in the right middle lobe. -Patient presented to PCPs office on day of admission with generalized weakness, fatigue, shortness of breath, productive cough, subjective fevers and chills. -CT angiogram chest done with progression of right middle lobe opacification compared to CAT scan done 02/25/2023 and also showed "No evidence of pulmonary embolism to the proximal subsegmental pulmonary artery level.  Multifocal pneumonia with consolidation in the medial segment of the middle lobe. Innumerable subcentimeter sized noncalcified nodules throughout bilateral lungs which are nonspecific but favored to represent endobronchial spread of infection as well. Short-term follow-up examination in 3 months is recommended to document resolution." -Patient noted to have soft blood pressure which responded to IV fluids. -Patient pancultured, blood cultures obtained and showed no growth to date at 5 days -MRSA PCR negative. -SARS coronavirus 2 PCR negative. -SLP evaluation obtained and recommending Regular Diet with Thin Liquid with No follow up.  -Discontinued IV vancomycin.  -Status post Azithromycin.   -Continue with Albuterol 2.5 mg nebs every 6 as needed for wheezing or shortness of breath -Continue IV Zosyn through today and if continued improvement transition to Augmentin tomorrow with likely discharge and ambulatory home O2 screen -Continue guaifenesin 1200 mg p.o. twice daily and will  add flutter valve incentive spirometry; Does not like Flutter Valve. -Repeat chest x-ray this AM showed "Hypoinflation of the  lungs with minimal bibasilar subsegmental atelectasis."   Hypertension -BP was soft yesterday morning.   -Discontinue Lasix and placed on half home dose spironolactone.  She is now on 25 mg p.o. daily of spironolactone and will need to continue monitor blood pressures per protocol -Last blood pressure reading was 126/61   Tobacco Abuse COPD -Currently not in exacerbation we will continue Breo Ellipta plus Incruse Ellipta as well as fluticasone 2 sprays each needed daily -Continue with guaifenesin 1200 mL p.o. twice daily -SpO2: 95 %; continue to monitor respiratory status carefully -Tobacco cessation and counseling given   GERD/GI Prophylaxis -Continue PPI with Pantoprazole 40 mg p.o. daily  Hyperlipidemia -Continue Simvastatin 40 mg p.o. daily   Diabetes Mellitus Type 2 -Hemoglobin A1c 7.1 (02/18/2023) -Continue to hold oral hypoglycemic agents. -Continue with moderate NovoLog size scale insulin before meals and at bedtime -Continue monitor CBGs per protocol and blood glucose per protocol alcohol CBG and glucose trend: Recent Labs  Lab 03/12/23 2106 03/13/23 0738 03/13/23 1142 03/13/23 1657 03/13/23 2059 03/14/23 0726 03/14/23 1221  GLUCAP 120* 102* 167* 149* 163* 107* 167*   Recent Labs  Lab 03/08/23 1411 03/09/23 0311 03/10/23 0312 03/11/23 0402 03/12/23 0419 03/13/23 0359 03/14/23 0403  GLUCOSE 157* 117* 110* 124* 137* 97 112*    Lower extremity edema/lymphedema -Patient states on Lasix and spironolactone for lymphedema. -Lower extremities wrapped and gets them wrapped every other day -Patient with soft borderline blood pressure and as such discontinue Lasix and placed on half home dose spironolactone. -Continue to monitor blood pressures per protocol; last blood pressure reading was  Hypothyroidism -Continue Levothyroxine 88 mcg p.o. daily before breakfast -TSH was 0.343 -Repeat TFTs in 4-6 weeks   Hypokalemia -Patient's K+ Level Trend: Recent Labs   Lab 03/08/23 1411 03/09/23 0311 03/10/23 0312 03/11/23 0402 03/12/23 0419 03/13/23 0359 03/14/23 0403  K 3.8 3.7 3.4* 3.7 3.9 3.6 3.3*  -Replete with po Kcl 40 mEQ BID x2  -Continue to Monitor and Replete as Necessary -Repeat CMP in the AM    ??  IV infiltration right upper extremity/??  Superficial thrombophlebitis -Warm compresses 4 times daily. -Keep right upper extremity elevated. -Continue with ibuprofen 400 mg p.o. daily x 5 days -Supportive care.  Metabolic Acidosis -Mild. Patient's CO2 is now 21, anion gap is now 11, chloride level is 105 -Continue to monitor and trend and repeat CMP in the a.m.  Normocytic Anemia -Hgb/Hct Trend: Recent Labs  Lab 03/08/23 1411 03/09/23 0311 03/10/23 0312 03/11/23 0402 03/12/23 0419 03/13/23 0359 03/14/23 0403  HGB 12.6 11.6* 10.8* 10.6* 11.8* 11.3* 11.7*  HCT 37.7 35.3* 33.2* 32.1* 36.3 34.8* 36.0  MCV 89.5 92.4 91.7 91.5 93.1 90.4 91.1  -Check Anemia Panel in the AM -Continue to Monitor for S/Sx of Bleeding; no overt bleeding noted -Repeat CBC in the AM    Hypoalbuminemia -Patient's Albumin Trend: Recent Labs  Lab 02/18/23 1515 03/08/23 1411 03/14/23 0403  ALBUMIN 4.1 3.4* 2.8*  -Continue to Monitor and Trend and repeat CMP in the AM  Obesity -Complicates overall prognosis and care -Estimated body mass index is 32.29 kg/m as calculated from the following:   Height as of this encounter: 5' (1.524 m).   Weight as of this encounter: 75 kg.  -Weight Loss and Dietary Counseling given   DVT prophylaxis: enoxaparin (LOVENOX) injection 40 mg Start: 03/09/23 0800 SCDs Start: 03/08/23 2024  Code Status: Full Code Family Communication: No family present at bedside  Disposition Plan:  Level of care: Progressive Status is: Inpatient Remains inpatient appropriate because: Anticipate D/C in the next 24 hours   Consultants:  None  Procedures:  As delineated as above  Antimicrobials:  Anti-infectives (From  admission, onward)    Start     Dose/Rate Route Frequency Ordered Stop   03/10/23 0600  vancomycin (VANCOCIN) IVPB 1000 mg/200 mL premix  Status:  Discontinued        1,000 mg 200 mL/hr over 60 Minutes Intravenous Every 36 hours 03/08/23 2126 03/09/23 1422   03/09/23 1800  vancomycin (VANCOREADY) IVPB 750 mg/150 mL  Status:  Discontinued        750 mg 150 mL/hr over 60 Minutes Intravenous Every 24 hours 03/09/23 1422 03/10/23 0801   03/08/23 2200  azithromycin (ZITHROMAX) 500 mg in sodium chloride 0.9 % 250 mL IVPB        500 mg 250 mL/hr over 60 Minutes Intravenous Every 24 hours 03/08/23 2056 03/11/23 0231   03/08/23 2200  piperacillin-tazobactam (ZOSYN) IVPB 3.375 g        3.375 g 12.5 mL/hr over 240 Minutes Intravenous Every 8 hours 03/08/23 2103     03/08/23 2200  piperacillin-tazobactam (ZOSYN) IVPB 3.375 g        3.375 g 12.5 mL/hr over 240 Minutes Intravenous  Once 03/08/23 2110     03/08/23 1715  piperacillin-tazobactam (ZOSYN) IVPB 3.375 g  Status:  Discontinued        3.375 g 12.5 mL/hr over 240 Minutes Intravenous  Once 03/08/23 1714 03/08/23 2200   03/08/23 1715  vancomycin (VANCOCIN) IVPB 1000 mg/200 mL premix  Status:  Discontinued        1,000 mg 200 mL/hr over 60 Minutes Intravenous  Once 03/08/23 1714 03/08/23 2001       Subjective: Seen and examined at bedside and she is not feeling well today and states that she has been coughing more and having more drainage and feels like she has been sneezing more.  Denies any nausea or vomiting.  No other concerns or plaints this time.  Objective: Vitals:   03/13/23 2024 03/14/23 0350 03/14/23 0832 03/14/23 1354  BP: 118/61 130/63  126/61  Pulse: 62 63  72  Resp: 18 18  20   Temp: 98.3 F (36.8 C) 97.9 F (36.6 C)  98.8 F (37.1 C)  TempSrc: Oral Oral  Oral  SpO2: 96% 97% 94% 95%  Weight:      Height:        Intake/Output Summary (Last 24 hours) at 03/14/2023 1440 Last data filed at 03/14/2023 0900 Gross per 24 hour   Intake 123 ml  Output 0 ml  Net 123 ml   Filed Weights   03/08/23 1342 03/08/23 1918  Weight: 75 kg 75 kg   Examination: Physical Exam:  Constitutional: WN/WD obese Caucasian female who appears slightly uncomfortable Respiratory: Diminished to auscultation bilaterally, no wheezing, rales, rhonchi or crackles. Normal respiratory effort and patient is not tachypenic. No accessory muscle use.  Unlabored breathing Cardiovascular: RRR, no murmurs / rubs / gallops. S1 and S2 auscultated. No extremity edema. .  Abdomen: Soft, non-tender, distended secondary to body habitus. Bowel sounds positive.  GU: Deferred. Musculoskeletal: No clubbing / cyanosis of digits/nails. No joint deformity upper and lower extremities.  Skin: No rashes, lesions, ulcers on limited skin evaluation. No induration; Warm and dry.  Neurologic: CN 2-12 grossly intact with no focal deficits. Romberg  sign and cerebellar reflexes not assessed.  Psychiatric: Normal judgment and insight. Alert and oriented x 3. Normal mood and appropriate affect.   Data Reviewed: I have personally reviewed following labs and imaging studies  CBC: Recent Labs  Lab 03/08/23 1411 03/09/23 0311 03/10/23 0312 03/11/23 0402 03/12/23 0419 03/13/23 0359 03/14/23 0403  WBC 10.3   < > 6.4 6.3 6.5 5.8 6.2  NEUTROABS 7.2  --  3.6  --   --   --  3.5  HGB 12.6   < > 10.8* 10.6* 11.8* 11.3* 11.7*  HCT 37.7   < > 33.2* 32.1* 36.3 34.8* 36.0  MCV 89.5   < > 91.7 91.5 93.1 90.4 91.1  PLT 203   < > 186 205 243 247 283   < > = values in this interval not displayed.   Basic Metabolic Panel: Recent Labs  Lab 03/10/23 0312 03/11/23 0402 03/12/23 0419 03/13/23 0359 03/14/23 0403  NA 133* 134* 135 136 133*  K 3.4* 3.7 3.9 3.6 3.3*  CL 101 105 106 105 101  CO2 23 21* 21* 20* 21*  GLUCOSE 110* 124* 137* 97 112*  BUN 10 9 8 9 11   CREATININE 0.70 0.62 0.67 0.63 0.54  CALCIUM 7.6* 7.3* 7.7* 7.8* 7.9*  MG 2.2  --   --   --  2.2  PHOS  --   --    --   --  3.7   GFR: Estimated Creatinine Clearance: 52.4 mL/min (by C-G formula based on SCr of 0.54 mg/dL). Liver Function Tests: Recent Labs  Lab 03/08/23 1411 03/14/23 0403  AST 23 24  ALT 13 17  ALKPHOS 97 72  BILITOT 0.4 0.5  PROT 6.8 6.4*  ALBUMIN 3.4* 2.8*   No results for input(s): "LIPASE", "AMYLASE" in the last 168 hours. No results for input(s): "AMMONIA" in the last 168 hours. Coagulation Profile: Recent Labs  Lab 03/09/23 0311  INR 1.1   Cardiac Enzymes: No results for input(s): "CKTOTAL", "CKMB", "CKMBINDEX", "TROPONINI" in the last 168 hours. BNP (last 3 results) No results for input(s): "PROBNP" in the last 8760 hours. HbA1C: No results for input(s): "HGBA1C" in the last 72 hours. CBG: Recent Labs  Lab 03/13/23 1142 03/13/23 1657 03/13/23 2059 03/14/23 0726 03/14/23 1221  GLUCAP 167* 149* 163* 107* 167*   Lipid Profile: No results for input(s): "CHOL", "HDL", "LDLCALC", "TRIG", "CHOLHDL", "LDLDIRECT" in the last 72 hours. Thyroid Function Tests: No results for input(s): "TSH", "T4TOTAL", "FREET4", "T3FREE", "THYROIDAB" in the last 72 hours. Anemia Panel: No results for input(s): "VITAMINB12", "FOLATE", "FERRITIN", "TIBC", "IRON", "RETICCTPCT" in the last 72 hours. Sepsis Labs: Recent Labs  Lab 03/08/23 1429  LATICACIDVEN 1.9   Recent Results (from the past 240 hour(s))  SARS Coronavirus 2 by RT PCR (hospital order, performed in [place] hospital lab) *cepheid single result test* Urine, Clean Catch     Status: None   Collection Time: 03/08/23  1:46 PM   Specimen: Urine, Clean Catch; Nasal Swab  Result Value Ref Range Status   SARS Coronavirus 2 by RT PCR NEGATIVE NEGATIVE Final    Comment: (NOTE) SARS-CoV-2 target nucleic acids are NOT DETECTED.  The SARS-CoV-2 RNA is generally detectable in upper and lower respiratory specimens during the acute phase of infection. The lowest concentration of SARS-CoV-2 viral copies this assay can  detect is 250 copies / mL. A negative result does not preclude SARS-CoV-2 infection and should not be used as the sole basis for treatment or other  patient management decisions.  A negative result may occur with improper specimen collection / handling, submission of specimen other than nasopharyngeal swab, presence of viral mutation(s) within the areas targeted by this assay, and inadequate number of viral copies (<250 copies / mL). A negative result must be combined with clinical observations, patient history, and epidemiological information.  Fact Sheet for Patients:   RoadLapTop.co.za  Fact Sheet for Healthcare Providers: http://kim-miller.com/  This test is not yet approved or  cleared by the [place] FDA and has been authorized for detection and/or diagnosis of SARS-CoV-2 by FDA under an Emergency Use Authorization (EUA).  This EUA will remain in effect (meaning this test can be used) for the duration of the COVID-19 declaration under Section 564(b)(1) of the Act, 21 U.S.C. section 360bbb-3(b)(1), unless the authorization is terminated or revoked sooner.  Performed at [facility], [address] [place], [place] [address]   Blood culture (routine x 2)     Status: None   Collection Time: 03/08/23  2:18 PM   Specimen: BLOOD  Result Value Ref Range Status   Specimen Description   Final    BLOOD RIGHT ANTECUBITAL Performed at [facility], [address] [place], [place] [address]    Special Requests   Final    BOTTLES DRAWN AEROBIC AND ANAEROBIC Blood Culture adequate volume Performed at [facility], [address] [place], [place] [address]    Culture   Final    NO GROWTH 5 DAYS Performed at [facility], [address] [place], [place] [address]    Report Status 03/13/2023 FINAL  Final  Blood culture (routine x 2)     Status: None   Collection Time: 03/08/23  2:28 PM    Specimen: BLOOD  Result Value Ref Range Status   Specimen Description   Final    BLOOD BLOOD RIGHT HAND Performed at [facility], [address] [place], [place] [address]    Special Requests   Final    BOTTLES DRAWN AEROBIC AND ANAEROBIC Blood Culture results may not be optimal due to an inadequate volume of blood received in culture bottles Performed at [facility], [address] [place], [place] [address]    Culture   Final    NO GROWTH 5 DAYS Performed at [facility], [address], [place], [place] [address]    Report Status 03/13/2023 FINAL  Final  MRSA Next Gen by PCR, Nasal     Status: None   Collection Time: 03/08/23  7:36 PM   Specimen: Nasal Mucosa; Nasal Swab  Result Value Ref Range Status   MRSA by PCR Next Gen NOT DETECTED NOT DETECTED Final    Comment: (NOTE) The GeneXpert MRSA Assay (FDA approved for NASAL specimens only), is one component of a comprehensive MRSA colonization surveillance program. It is not intended to diagnose MRSA infection nor to guide or monitor treatment for MRSA infections. Test performance is not FDA approved in patients less than 11 years old. Performed at [facility], [address] [place], [place] [address]     Radiology Studies: DG CHEST PORT 1 VIEW  Result Date: 03/14/2023 CLINICAL DATA:  Shortness of breath. EXAM: PORTABLE CHEST 1 VIEW COMPARISON:  March 09, 2023. FINDINGS: The heart size and mediastinal contours are within normal limits. Hypoinflation of the lungs is noted with minimal bibasilar subsegmental atelectasis. Status post surgical posterior  fusion of lower thoracic and lumbar spine. IMPRESSION: Hypoinflation of the lungs with minimal bibasilar subsegmental atelectasis. Electronically Signed   By: Lupita Raider M.D.   On: 03/14/2023 09:31    Scheduled Meds:  bisacodyl  5 mg Oral QHS   carisoprodol  175 mg Oral TID   dorzolamide-timolol  1 drop Left Eye BID    enoxaparin (LOVENOX) injection  40 mg Subcutaneous Q24H   estradiol  0.5 mg Oral QHS   feeding supplement  237 mL Oral BID BM   fluticasone  2 spray Each Nare Daily   fluticasone furoate-vilanterol  1 puff Inhalation Daily   And   umeclidinium bromide  1 puff Inhalation Daily   guaiFENesin  1,200 mg Oral BID   ibuprofen  400 mg Oral Q1200   insulin aspart  0-15 Units Subcutaneous TID WC   insulin aspart  0-5 Units Subcutaneous QHS   levothyroxine  88 mcg Oral QAC breakfast   ondansetron  4 mg Intravenous Once   pantoprazole  40 mg Oral Daily   potassium chloride  40 mEq Oral BID   simvastatin  40 mg Oral QHS   sodium chloride flush  3 mL Intravenous Q12H   spironolactone  25 mg Oral Daily   traZODone  50 mg Oral QHS   triamcinolone cream   Topical BID   Continuous Infusions:  sodium chloride     piperacillin-tazobactam (ZOSYN)  IV 3.375 g (03/14/23 1227)   piperacillin-tazobactam (ZOSYN)  IV Stopped (03/08/23 2128)    LOS: 6 days   Marguerita Merles, DO [facility] Available via Epic secure chat 7am-7pm After these hours, please refer to coverage provider listed on [email or web address] 03/14/2023, 2:40 PM

## 2023-03-14 NOTE — Progress Notes (Signed)
Transition of Care [facility]) - Inpatient Brief Assessment   Patient Details  Name: CHARLISHA HAMBERG MRN: [id number] Date of Birth: 23-Jul-1945  Transition of Care [facility]) CM/SW Contact:    Larrie Kass, LCSW Phone Number: 03/14/2023, 3:37 PM   Clinical Narrative:  PT sign off. Pt with no TOC needs , TOC sign off.   Transition of Care Asessment: Insurance and Status: Insurance coverage has been reviewed Patient has primary care physician: Yes Home environment has been reviewed: home Prior level of function:: mod independent Prior/Current Home Services: No current home services Social Determinants of Health Reivew: SDOH reviewed no interventions necessary Readmission risk has been reviewed: Yes Transition of care needs: no transition of care needs at this time

## 2023-03-14 NOTE — Progress Notes (Signed)
Mobility Specialist - Progress Note   03/14/23 1419  Mobility  Activity Ambulated with assistance in hallway;Ambulated with assistance to bathroom  Level of Assistance Standby assist, set-up cues, supervision of patient - no hands on  Assistive Device [employer] Ambulated (ft) 230 ft  Activity Response Tolerated well  Mobility Referral Yes  $Mobility charge 1 Mobility  Mobility Specialist Start Time (ACUTE ONLY) 0205  Mobility Specialist Stop Time (ACUTE ONLY) 0218  Mobility Specialist Time Calculation (min) (ACUTE ONLY) 13 min   Pt received in getting OOB requesting assistance to the bathroom. No complaints during session. Pt to bed after session with all needs met. Bed alarm on.  [facility]

## 2023-03-14 NOTE — Plan of Care (Signed)
°  Problem: Education: Goal: Knowledge of General Education information will improve Description: Including pain rating scale, medication(s)/side effects and non-pharmacologic comfort measures Outcome: Progressing   Problem: Clinical Measurements: Goal: Cardiovascular complication will be avoided Outcome: Progressing   Problem: Activity: Goal: Risk for activity intolerance will decrease Outcome: Progressing   Problem: Coping: Goal: Level of anxiety will decrease Outcome: Progressing   Problem: Elimination: Goal: Will not experience complications related to bowel motility Outcome: Progressing Goal: Will not experience complications related to urinary retention Outcome: Progressing   Problem: Pain Managment: Goal: General experience of comfort will improve Outcome: Progressing   Problem: Safety: Goal: Ability to remain free from injury will improve Outcome: Progressing   Problem: Skin Integrity: Goal: Risk for impaired skin integrity will decrease Outcome: Progressing

## 2023-03-14 NOTE — Care Management Important Message (Signed)
Important Message  Patient Details IM Letter given. Name: Joan [name] MRN: [id number] Date of Birth: 1946-02-16   Medicare Important Message Given:  Yes     Caren Macadam 03/14/2023, 3:49 PM

## 2023-03-15 ENCOUNTER — Inpatient Hospital Stay (HOSPITAL_COMMUNITY): Payer: Medicare Other

## 2023-03-15 ENCOUNTER — Encounter (HOSPITAL_COMMUNITY): Payer: Medicare Other

## 2023-03-15 DIAGNOSIS — J189 Pneumonia, unspecified organism: Secondary | ICD-10-CM | POA: Diagnosis not present

## 2023-03-15 DIAGNOSIS — F17210 Nicotine dependence, cigarettes, uncomplicated: Secondary | ICD-10-CM | POA: Diagnosis not present

## 2023-03-15 DIAGNOSIS — K219 Gastro-esophageal reflux disease without esophagitis: Secondary | ICD-10-CM | POA: Diagnosis not present

## 2023-03-15 DIAGNOSIS — E039 Hypothyroidism, unspecified: Secondary | ICD-10-CM | POA: Diagnosis not present

## 2023-03-15 LAB — COMPREHENSIVE METABOLIC PANEL
ALT: 22 U/L (ref 0–44)
AST: 27 U/L (ref 15–41)
Albumin: 2.9 g/dL — ABNORMAL LOW (ref 3.5–5.0)
Alkaline Phosphatase: 76 U/L (ref 38–126)
Anion gap: 10 (ref 5–15)
BUN: 8 mg/dL (ref 8–23)
CO2: 21 mmol/L — ABNORMAL LOW (ref 22–32)
Calcium: 8.1 mg/dL — ABNORMAL LOW (ref 8.9–10.3)
Chloride: 105 mmol/L (ref 98–111)
Creatinine, Ser: 0.67 mg/dL (ref 0.44–1.00)
GFR, Estimated: >60 mL/min (ref >60)
Glucose, Bld: 154 mg/dL — ABNORMAL HIGH (ref 70–99)
Potassium: 4 mmol/L (ref 3.5–5.1)
Sodium: 136 mmol/L (ref 135–145)
Total Bilirubin: 0.5 mg/dL (ref 0.3–1.2)
Total Protein: 6.3 g/dL — ABNORMAL LOW (ref 6.5–8.1)

## 2023-03-15 LAB — CBC WITH DIFFERENTIAL/PLATELET
Abs Immature Granulocytes: 0.22 10*3/uL — ABNORMAL HIGH (ref 0.00–0.07)
Basophils Absolute: 0.1 10*3/uL (ref 0.0–0.1)
Basophils Relative: 1 %
Eosinophils Absolute: 0.1 10*3/uL (ref 0.0–0.5)
Eosinophils Relative: 2 %
HCT: 37.1 % (ref 36.0–46.0)
Hemoglobin: 12.1 g/dL (ref 12.0–15.0)
Immature Granulocytes: 3 %
Lymphocytes Relative: 19 %
Lymphs Abs: 1.2 10*3/uL (ref 0.7–4.0)
MCH: 30.1 pg (ref 26.0–34.0)
MCHC: 32.6 g/dL (ref 30.0–36.0)
MCV: 92.3 fL (ref 80.0–100.0)
Monocytes Absolute: 0.7 10*3/uL (ref 0.1–1.0)
Monocytes Relative: 11 %
Neutro Abs: 4.1 10*3/uL (ref 1.7–7.7)
Neutrophils Relative %: 64 %
Platelets: 303 10*3/uL (ref 150–400)
RBC: 4.02 MIL/uL (ref 3.87–5.11)
RDW: 13.7 % (ref 11.5–15.5)
WBC: 6.5 10*3/uL (ref 4.0–10.5)
nRBC: 0 % (ref 0.0–0.2)

## 2023-03-15 LAB — VITAMIN B12: Vitamin B-12: 325 pg/mL (ref 180–914)

## 2023-03-15 LAB — IRON AND TIBC
Iron: 48 ug/dL (ref 28–170)
Saturation Ratios: 16 % (ref 10.4–31.8)
TIBC: 307 ug/dL (ref 250–450)
UIBC: 259 ug/dL

## 2023-03-15 LAB — GLUCOSE, CAPILLARY
Glucose-Capillary: 117 mg/dL — ABNORMAL HIGH (ref 70–99)
Glucose-Capillary: 154 mg/dL — ABNORMAL HIGH (ref 70–99)

## 2023-03-15 LAB — PHOSPHORUS: Phosphorus: 2.7 mg/dL (ref 2.5–4.6)

## 2023-03-15 LAB — RETICULOCYTES
Immature Retic Fract: 17 % — ABNORMAL HIGH (ref 2.3–15.9)
RBC.: 3.98 MIL/uL (ref 3.87–5.11)
Retic Count, Absolute: 69.3 10*3/uL (ref 19.0–186.0)
Retic Ct Pct: 1.7 % (ref 0.4–3.1)

## 2023-03-15 LAB — MAGNESIUM: Magnesium: 2.2 mg/dL (ref 1.7–2.4)

## 2023-03-15 LAB — FERRITIN: Ferritin: 149 ng/mL (ref 11–307)

## 2023-03-15 LAB — FOLATE: Folate: 6.1 ng/mL (ref >5.9)

## 2023-03-15 MED ORDER — POLYETHYLENE GLYCOL 3350 17 G PO PACK: 17.0000 g | PACK | Freq: Every day | ORAL | 0 refills | Status: DC | PRN

## 2023-03-15 MED ORDER — AZITHROMYCIN 500 MG PO TABS: 500.0000 mg | ORAL_TABLET | Freq: Every day | ORAL | 0 refills | Status: AC

## 2023-03-15 MED ORDER — TRELEGY ELLIPTA 100-62.5-25 MCG/ACT IN AEPB: 1.0000 | INHALATION_SPRAY | Freq: Every day | RESPIRATORY_TRACT | 3 refills | Status: DC

## 2023-03-15 MED ORDER — CEFDINIR 300 MG PO CAPS
300.0000 mg | ORAL_CAPSULE | Freq: Two times a day (BID) | ORAL | Status: DC
  Administered 2023-03-15: 300 mg via ORAL
  Filled 2023-03-15: qty 1

## 2023-03-15 MED ORDER — ENSURE ENLIVE PO LIQD: 237.0000 mL | Freq: Two times a day (BID) | ORAL | 12 refills | Status: DC

## 2023-03-15 MED ORDER — ORAL CARE MOUTH RINSE: 15.0000 mL | OROMUCOSAL | Status: DC | PRN

## 2023-03-15 MED ORDER — GUAIFENESIN ER 600 MG PO TB12: 600.0000 mg | ORAL_TABLET | Freq: Two times a day (BID) | ORAL | 0 refills | Status: AC

## 2023-03-15 MED ORDER — AZITHROMYCIN 250 MG PO TABS
500.0000 mg | ORAL_TABLET | Freq: Every day | ORAL | Status: DC
  Administered 2023-03-15: 500 mg via ORAL
  Filled 2023-03-15: qty 2

## 2023-03-15 MED ORDER — CEFDINIR 300 MG PO CAPS: 300.0000 mg | ORAL_CAPSULE | Freq: Two times a day (BID) | ORAL | 0 refills | Status: AC

## 2023-03-15 NOTE — Plan of Care (Signed)
  Problem: Education: Goal: Knowledge of General Education information will improve Description: Including pain rating scale, medication(s)/side effects and non-pharmacologic comfort measures Outcome: Completed/Met   Problem: Health Behavior/Discharge Planning: Goal: Ability to manage health-related needs will improve Outcome: Completed/Met   Problem: Clinical Measurements: Goal: Ability to maintain clinical measurements within normal limits will improve Outcome: Completed/Met Goal: Will remain free from infection Outcome: Completed/Met Goal: Diagnostic test results will improve Outcome: Completed/Met Goal: Respiratory complications will improve Outcome: Completed/Met Goal: Cardiovascular complication will be avoided Outcome: Completed/Met   Problem: Activity: Goal: Risk for activity intolerance will decrease Outcome: Completed/Met   Problem: Nutrition: Goal: Adequate nutrition will be maintained Outcome: Completed/Met   Problem: Coping: Goal: Level of anxiety will decrease Outcome: Completed/Met   Problem: Elimination: Goal: Will not experience complications related to bowel motility Outcome: Completed/Met Goal: Will not experience complications related to urinary retention Outcome: Completed/Met   Problem: Pain Managment: Goal: General experience of comfort will improve Outcome: Completed/Met   Problem: Safety: Goal: Ability to remain free from injury will improve Outcome: Completed/Met   Problem: Skin Integrity: Goal: Risk for impaired skin integrity will decrease Outcome: Completed/Met   Problem: Education: Goal: Knowledge of General Education information will improve Description: Including pain rating scale, medication(s)/side effects and non-pharmacologic comfort measures Outcome: Completed/Met   Problem: Health Behavior/Discharge Planning: Goal: Ability to manage health-related needs will improve Outcome: Completed/Met   Problem: Clinical  Measurements: Goal: Ability to maintain clinical measurements within normal limits will improve Outcome: Completed/Met Goal: Will remain free from infection Outcome: Completed/Met Goal: Diagnostic test results will improve Outcome: Completed/Met Goal: Respiratory complications will improve Outcome: Completed/Met Goal: Cardiovascular complication will be avoided Outcome: Completed/Met   Problem: Activity: Goal: Risk for activity intolerance will decrease Outcome: Completed/Met   Problem: Nutrition: Goal: Adequate nutrition will be maintained Outcome: Completed/Met   Problem: Coping: Goal: Level of anxiety will decrease Outcome: Completed/Met   Problem: Elimination: Goal: Will not experience complications related to bowel motility Outcome: Completed/Met Goal: Will not experience complications related to urinary retention Outcome: Completed/Met   Problem: Pain Managment: Goal: General experience of comfort will improve Outcome: Completed/Met   Problem: Safety: Goal: Ability to remain free from injury will improve Outcome: Completed/Met   Problem: Skin Integrity: Goal: Risk for impaired skin integrity will decrease Outcome: Completed/Met   Problem: Education: Goal: Ability to describe self-care measures that may prevent or decrease complications (Diabetes Survival Skills Education) will improve Outcome: Completed/Met Goal: Individualized Educational Video(s) Outcome: Completed/Met   Problem: Coping: Goal: Ability to adjust to condition or change in health will improve Outcome: Completed/Met   Problem: Fluid Volume: Goal: Ability to maintain a balanced intake and output will improve Outcome: Completed/Met   Problem: Health Behavior/Discharge Planning: Goal: Ability to identify and utilize available resources and services will improve Outcome: Completed/Met Goal: Ability to manage health-related needs will improve Outcome: Completed/Met   Problem:  Metabolic: Goal: Ability to maintain appropriate glucose levels will improve Outcome: Completed/Met   Problem: Nutritional: Goal: Maintenance of adequate nutrition will improve Outcome: Completed/Met Goal: Progress toward achieving an optimal weight will improve Outcome: Completed/Met   Problem: Skin Integrity: Goal: Risk for impaired skin integrity will decrease Outcome: Completed/Met   Problem: Tissue Perfusion: Goal: Adequacy of tissue perfusion will improve Outcome: Completed/Met

## 2023-03-15 NOTE — Progress Notes (Signed)
Patient received discharge orders to go home. Patient was given discharge paperwork/instructions. RN went over discharge paperwork/instructions with the patient and patient's family member. All questions/concerns were addressed/answered to the best of RN's ability. Patient left the hospital stable, had discharge paperwork/instructions, and had all personal belongings.

## 2023-03-15 NOTE — Discharge Summary (Signed)
Physician Discharge Summary   Patient: Joan [name] MRN: [id number] DOB: [birth year]  Admit date:     03/08/2023  Discharge date: 03/15/2023  Discharge Physician: Marguerita Merles, DO   PCP: Donato Schultz, DO   Recommendations at discharge:   Follow-up with PCP within 1 to 2 weeks and repeat CBC, CMP, mag, Phos within 1 week Repeat chest x-ray in 3 to 6 weeks to ensure clearance Repeat thyroid function test in 4 to 6 weeks  Discharge Diagnoses: Principal Problem:   Multifocal pneumonia Active Problems:   Hypothyroidism   TOBACCO ABUSE   GERD   Lower extremity edema   Cigarette smoker   Pneumonia   Lymphedema  Resolved Problems:   * No resolved hospital problems. [facility] Course: The patient is a 77 year old Caucasian female with a past medical history significant for bronchitis COPD as noted in the chart but denied by the patient as well as other history include hypothyroidism, hypertension, tobacco abuse, lymphedema, and other comorbidities who presented to the hospital with a new onset cough, weakness and fever.  She was in her usual state of health until about 02/18/2023 and started feeling unwell.  Patient underwent outpatient evaluation from her PCP and had a CT scan on 02/25/2023 which showed new airspace disease in the right upper lobe with persistent disabilities in the right middle lobe and was consistent with pneumonia.  She was initiated on doxycycline and took this for a few days however was unable to tolerate due to vomiting and nausea and change to levofloxacin and finished it a few days ago.  She came back to her PCP office and complained of ongoing extreme weakness, sensation of shortness of breath as well as reportedly vomiting at home and any chest pain but states that she did cough and have shortness of breath.  She also reported having subjective fever and felt extremely cold.  Given her concern with the primary care physician transfer the patient to the ED for  further evaluation and admission for failure of outpatient antibiotic therapy she was initiated on IV Zosyn and has been transferred to the stepdown unit.  She has improved but did not feel well today. Will observe overnight and anticipate D/C in the next 24 hours.   Repeat CXR done this yesterday showed "Hypoinflation of the lungs with minimal bibasilar subsegmental atelectasis." Will need Ambulatory Home O2 screen and she is stable to discharge at this time and will need to follow-up with PCP and repeat chest x-ray in 3 to 6 weeks and continue oral antibiotics.   Assessment and Plan:  Multifocal Pneumonia, improving  -Patient noted to have failed outpatient therapy. -Patient initially diagnosed 02/18/2023 received 3 days of doxycycline which she could not tolerate and antibiotics changed to Levaquin which she completed in the outpatient setting however states was having bouts of nausea and emesis while on Levaquin. -Patient does state due to nausea and vomiting unable to tolerate doxycycline.  -On follow-up CT 02/25/2023 with new airspace disease noted in the right upper lobe with persistent airspace disease in the right middle lobe. -Patient presented to PCPs office on day of admission with generalized weakness, fatigue, shortness of breath, productive cough, subjective fevers and chills. -CT angiogram chest done with progression of right middle lobe opacification compared to CAT scan done 02/25/2023 and also showed "No evidence of pulmonary embolism to the proximal subsegmental pulmonary artery level.  Multifocal pneumonia with consolidation in the medial segment of the middle lobe. Innumerable subcentimeter  sized noncalcified nodules throughout bilateral lungs which are nonspecific but favored to represent endobronchial spread of infection as well. Short-term follow-up examination in 3 months is recommended to document resolution." -Patient noted to have soft blood pressure which responded to IV  fluids. -Patient pancultured, blood cultures obtained and showed no growth to date at 5 days -MRSA PCR negative. -SARS coronavirus 2 PCR negative. -SLP evaluation obtained and recommending Regular Diet with Thin Liquid with No follow up.  -Discontinued IV vancomycin.   -Continue with Albuterol 2.5 mg nebs every 6 as needed for wheezing or shortness of breath -Continue IV Zosyn through today and if continued improvement transition to oral cefdinir and azithromycin for discharge -Continue guaifenesin 1200 mg p.o. twice daily and will add flutter valve incentive spirometry; Does not like Flutter Valve. -Repeat chest x-ray yesterday AM showed "Hypoinflation of the lungs with minimal bibasilar subsegmental atelectasis." -Repeat chest x-ray this a.m. done and showed "Stable radiographic appearance of the chest. Low lung volumes with right greater than left bibasilar opacities, favored to represent atelectasis." -Follow-up with PCP and repeat chest x-ray in 3 to 6 weeks and continue oral antibiotics for duration   Hypertension -BP was soft yesterday morning.   -Discontinue Lasix and placed on half home dose spironolactone.  She is now on 25 mg p.o. daily of spironolactone and will need to continue monitor blood pressures per protocol and can resume home dose medications -Last blood pressure reading was 126/61   Tobacco Abuse COPD -Currently not in exacerbation we will continue Breo Ellipta plus Incruse Ellipta as well as fluticasone 2 sprays each needed daily -Continue with guaifenesin 1200 mL p.o. twice daily -SpO2: 97 %; continue to monitor respiratory status carefully -Tobacco cessation and counseling given   GERD/GI Prophylaxis -Continue PPI with Pantoprazole 40 mg p.o. daily  Hyperlipidemia -Continue Simvastatin 40 mg p.o. daily   Diabetes Mellitus Type 2 -Hemoglobin A1c 7.1 (02/18/2023) -Continue to hold oral hypoglycemic agents. -Continue with moderate NovoLog size scale insulin  before meals and at bedtime -Continue monitor CBGs per protocol and blood glucose per protocol alcohol CBG and glucose trend: Recent Labs  Lab 03/13/23 2059 03/14/23 0726 03/14/23 1221 03/14/23 1634 03/14/23 2051 03/15/23 0740 03/15/23 1131  GLUCAP 163* 107* 167* 144* 143* 117* 154*   Recent Labs  Lab 03/09/23 0311 03/10/23 0312 03/11/23 0402 03/12/23 0419 03/13/23 0359 03/14/23 0403 03/15/23 0435  GLUCOSE 117* 110* 124* 137* 97 112* 154*    Lower extremity edema/lymphedema -Patient states on Lasix and spironolactone for lymphedema. -Lower extremities wrapped and gets them wrapped every other day -Patient with soft borderline blood pressure and as such discontinue Lasix and placed on half home dose spironolactone while hospitalized but since she is improved can resume her home Lasix and spironolactone -Continue to monitor blood pressures per protocol; follow-up with PCP within 1 to 2 weeks  Hypothyroidism -Continue Levothyroxine 88 mcg p.o. daily before breakfast -TSH was 0.343 -Repeat TFTs in 4-6 weeks   Hypokalemia -Patient's K+ Level Trend: Recent Labs  Lab 03/09/23 0311 03/10/23 0312 03/11/23 0402 03/12/23 0419 03/13/23 0359 03/14/23 0403 03/15/23 0435  K 3.7 3.4* 3.7 3.9 3.6 3.3* 4.0  -Replete with po Kcl 40 mEQ BID x2 yesterday -Continue to Monitor and Replete as Necessary -Repeat CMP within 1 week   ??  IV infiltration right upper extremity/??  Superficial thrombophlebitis -Warm compresses 4 times daily. -Keep right upper extremity elevated. -Continue with ibuprofen 400 mg p.o. daily x 5 days -Supportive care and continue monitoring  outpatient setting  Metabolic Acidosis, stable -Mild. Patient's CO2 is now 21, anion gap is now 10, chloride level is 105 -Continue to monitor and trend and repeat CMP within 1 week  Normocytic Anemia -Hgb/Hct Trend: Recent Labs  Lab 03/09/23 0311 03/10/23 0312 03/11/23 0402 03/12/23 0419 03/13/23 0359  03/14/23 0403 03/15/23 0435  HGB 11.6* 10.8* 10.6* 11.8* 11.3* 11.7* 12.1  HCT 35.3* 33.2* 32.1* 36.3 34.8* 36.0 37.1  MCV 92.4 91.7 91.5 93.1 90.4 91.1 92.3  -Checked Anemia Panel and showed an iron level of 48, UIBC of 259, TIBC 307, saturation ratios of 16%, ferritin level of 149, folate level 6.1, vitamin B12 325 -Continue to Monitor for S/Sx of Bleeding; no overt bleeding noted -Repeat CBC in the AM    Hypoalbuminemia -Patient's Albumin Trend: Recent Labs  Lab 02/18/23 1515 03/08/23 1411 03/14/23 0403 03/15/23 0435  ALBUMIN 4.1 3.4* 2.8* 2.9*  -Continue to Monitor and Trend and repeat CMP in the AM  Obesity -Complicates overall prognosis and care -Estimated body mass index is 32.29 kg/m as calculated from the following:   Height as of this encounter: 5' (1.524 m).   Weight as of this encounter: 75 kg.  -Weight Loss and Dietary Counseling given  Consultants: None Procedures performed: As delineated as above Disposition: Home as she refused home health evaluation Diet recommendation:  Discharge Diet Orders (From admission, onward)     Start     Ordered   03/15/23 0000  Diet - low sodium heart healthy        03/15/23 1233           Cardiac diet DISCHARGE MEDICATION: Allergies as of 03/15/2023       Reactions   Metformin And Related Nausea And Vomiting   Morphine And Codeine Swelling   5-alpha Reductase Inhibitors    Amitriptyline Swelling   Leg swelling   Augmentin [amoxicillin-pot Clavulanate] Nausea And Vomiting   Gabapentin Itching   Triamterene Itching, Rash        Medication List     TAKE these medications    albuterol 108 (90 Base) MCG/ACT inhaler Commonly known as: VENTOLIN HFA Inhale 2 puffs into the lungs every 6 (six) hours as needed for wheezing or shortness of breath.   azithromycin 500 MG tablet Commonly known as: ZITHROMAX Take 1 tablet (500 mg total) by mouth daily for 2 days.   carisoprodol 350 MG tablet Commonly known as:  SOMA Take 175 mg by mouth 3 (three) times daily.   cefdinir 300 MG capsule Commonly known as: OMNICEF Take 1 capsule (300 mg total) by mouth every 12 (twelve) hours for 2 days.   diclofenac Sodium 1 % Gel Commonly known as: VOLTAREN as needed.   dorzolamide-timolol 2-0.5 % ophthalmic solution Commonly known as: COSOPT Place 1 drop into the left eye 2 (two) times daily.   estradiol 0.5 MG tablet Commonly known as: ESTRACE Take 0.5 mg by mouth at bedtime.   feeding supplement Liqd Take 237 mLs by mouth 2 (two) times daily between meals.   fluticasone 50 MCG/ACT nasal spray Commonly known as: FLONASE Use 2 spray(s) in each nostril once daily What changed: See the new instructions.   furosemide 40 MG tablet Commonly known as: LASIX Take 2 tablets (80 mg total) by mouth daily.   guaiFENesin 600 MG 12 hr tablet Commonly known as: MUCINEX Take 1 tablet (600 mg total) by mouth 2 (two) times daily for 5 days.   ipratropium 0.06 % nasal spray Commonly known  as: ATROVENT Place 2 sprays into both nostrils 2 (two) times daily as needed for rhinitis.   Klor-Con M20 20 MEQ tablet Generic drug: potassium chloride SA TAKE 4 TABLETS BY MOUTH DAILY   levothyroxine 88 MCG tablet Commonly known as: SYNTHROID Take 1 tablet (88 mcg total) by mouth daily before breakfast.   metFORMIN 500 MG tablet Commonly known as: GLUCOPHAGE Take 1 tablet (500 mg total) by mouth 2 (two) times daily with a meal.   NONFORMULARY OR COMPOUNDED ITEM Compression socks  20-30 mm/ hg   Dx low ext edema   omeprazole 20 MG capsule Commonly known as: PRILOSEC Take 2 capsules by mouth once daily   oxyCODONE-acetaminophen 10-325 MG tablet Commonly known as: PERCOCET Take 1 tablet by mouth every 4 (four) hours as needed for pain (fill 01/15/23) What changed: Another medication with the same name was removed. Continue taking this medication, and follow the directions you see here.   polyethylene glycol 17 g  packet Commonly known as: MIRALAX / GLYCOLAX Take 17 g by mouth daily as needed for mild constipation.   PROBIOTIC ACIDOPHILUS BEADS PO Take 1 tablet by mouth daily.   simvastatin 40 MG tablet Commonly known as: ZOCOR Take 1 tablet (40 mg total) by mouth at bedtime.   spironolactone 25 MG tablet Commonly known as: ALDACTONE Take 1 tablet (25 mg total) by mouth 2 (two) times daily.   traZODone 50 MG tablet Commonly known as: DESYREL TAKE 1 TABLET BY MOUTH AT BEDTIME   Trelegy Ellipta 100-62.5-25 MCG/ACT Aepb Generic drug: Fluticasone-Umeclidin-Vilant Inhale 1 puff into the lungs daily.   triamcinolone cream 0.1 % Commonly known as: KENALOG Apply 1 Application topically.   TYLENOL 8 HOUR ARTHRITIS PAIN PO Take 1 tablet by mouth daily.       Discharge Exam: Filed Weights   03/08/23 1342 03/08/23 1918  Weight: 75 kg 75 kg   Vitals:   03/15/23 0104 03/15/23 0537  BP: (!) 130/58 (!) 132/58  Pulse: 60 (!) 59  Resp: 20   Temp: 97.8 F (36.6 C) 98.6 F (37 C)  SpO2: 97% 97%   Examination: Physical Exam:  Constitutional: WN/WD obese Caucasian female in no acute distress Respiratory: Diminished to auscultation bilaterally with some coarse breath sounds on sounds slight rhonchi but no appreciable crackles or rales.  Has a normal respiratory effort not wearing supplemental oxygen via nasal cannula has no accessory muscle usage Cardiovascular: RRR, no murmurs / rubs / gallops. S1 and S2 auscultated.  Has bilateral lymphedema which are wrapped Abdomen: Soft, non-tender, distended secondary body habitus. Bowel sounds positive.  GU: Deferred. Musculoskeletal: No clubbing / cyanosis of digits/nails. No joint deformity upper and lower extremities. Skin: Lower extremities are wrapped Neurologic: CN 2-12 grossly intact with no focal deficits. Romberg sign and cerebellar reflexes not assessed.  Psychiatric: Normal judgment and insight. Alert and oriented x 3. Normal mood and  appropriate affect.   Condition at discharge: stable  The results of significant diagnostics from this hospitalization (including imaging, microbiology, ancillary and laboratory) are listed below for reference.   Imaging Studies: DG CHEST PORT 1 VIEW  Result Date: 03/15/2023 CLINICAL DATA:  Shortness of breath EXAM: PORTABLE CHEST 1 VIEW COMPARISON:  03/14/2023 FINDINGS: The heart size and mediastinal contours are within normal limits. Low lung volumes with right greater than left bibasilar opacities. Unchanged reticulonodular opacities within the periphery of the right upper lobe, likely of a chronic postinflammatory or postinfectious etiology. No pleural effusion or pneumothorax. The visualized skeletal  structures are unremarkable. IMPRESSION: Stable radiographic appearance of the chest. Low lung volumes with right greater than left bibasilar opacities, favored to represent atelectasis. Electronically Signed   By: Duanne Guess D.O.   On: 03/15/2023 13:34   DG CHEST PORT 1 VIEW  Result Date: 03/14/2023 CLINICAL DATA:  Shortness of breath. EXAM: PORTABLE CHEST 1 VIEW COMPARISON:  March 09, 2023. FINDINGS: The heart size and mediastinal contours are within normal limits. Hypoinflation of the lungs is noted with minimal bibasilar subsegmental atelectasis. Status post surgical posterior fusion of lower thoracic and lumbar spine. IMPRESSION: Hypoinflation of the lungs with minimal bibasilar subsegmental atelectasis. Electronically Signed   By: Lupita Raider M.D.   On: 03/14/2023 09:31   DG Chest 2 View  Result Date: 03/09/2023 CLINICAL DATA:  Shortness of breath EXAM: CHEST - 2 VIEW COMPARISON:  02/18/2023 FINDINGS: New right middle lobe airspace opacity.  Left lung clear. Heart size and mediastinal contours are within normal limits. Aortic Atherosclerosis (ICD10-170.0). No effusion. Thoracolumbar and cervical fixation hardware noted. IMPRESSION: Right middle lobe infiltrate. Electronically Signed    By: Corlis Leak M.D.   On: 03/09/2023 20:24   CT Angio Chest PE W and/or Wo Contrast  Result Date: 03/08/2023 CLINICAL DATA:  Pulmonary embolism (PE) suspected, high prob. Cough. Fever with chills. Increased weakness. EXAM: CT ANGIOGRAPHY CHEST WITH CONTRAST TECHNIQUE: Multidetector CT imaging of the chest was performed using the standard protocol during bolus administration of intravenous contrast. Multiplanar CT image reconstructions and MIPs were obtained to evaluate the vascular anatomy. RADIATION DOSE REDUCTION: This exam was performed according to the departmental dose-optimization program which includes automated exposure control, adjustment of the mA and/or kV according to patient size and/or use of iterative reconstruction technique. CONTRAST:  75mL OMNIPAQUE IOHEXOL 350 MG/ML SOLN COMPARISON:  CT scan chest from 02/25/2023. FINDINGS: Cardiovascular: No evidence of embolism to the proximal subsegmental pulmonary artery level. Normal cardiac size. No pericardial effusion. No aortic aneurysm. Mediastinum/Nodes: Visualized thyroid gland appears grossly unremarkable. No solid / cystic mediastinal masses. The esophagus is nondistended precluding optimal assessment. No axillary, mediastinal or hilar lymphadenopathy by size criteria. Lungs/Pleura: The central tracheo-bronchial tree is patent. Minimal upper lobe predominant centrilobular emphysema noted. There is consolidation in the medial segment of the middle lobe with air bronchogram, compatible with pneumonia. There are additional patchy opacities throughout bilateral lungs favored to represent pneumonia as well there are innumerable subcentimeter sized noncalcified nodules throughout bilateral lungs which are nonspecific but favored to represent endobronchial spread of infection as well. No pleural effusion or pneumothorax. Upper Abdomen: Evaluation of upper abdominal viscera is markedly limited due to streak artifacts from spinal fixation hardware.  Visualized upper abdominal viscera are grossly within normal limits. Musculoskeletal: The visualized soft tissues of the chest wall are grossly unremarkable. No suspicious osseous lesions. There are mild multilevel degenerative changes in the visualized spine. Review of the MIP images confirms the above findings. IMPRESSION: 1. No evidence of pulmonary embolism to the proximal subsegmental pulmonary artery level. 2. Multifocal pneumonia with consolidation in the medial segment of the middle lobe. 3. Innumerable subcentimeter sized noncalcified nodules throughout bilateral lungs which are nonspecific but favored to represent endobronchial spread of infection as well. Short-term follow-up examination in 3 months is recommended to document resolution. Electronically Signed   By: Jules Schick M.D.   On: 03/08/2023 16:30   CT Chest Wo Contrast  Result Date: 03/05/2023 CLINICAL DATA:  Abnormal x-ray, lung nodule.  History of pneumonia. EXAM: CT CHEST WITHOUT  CONTRAST TECHNIQUE: Multidetector CT imaging of the chest was performed following the standard protocol without IV contrast. RADIATION DOSE REDUCTION: This exam was performed according to the departmental dose-optimization program which includes automated exposure control, adjustment of the mA and/or kV according to patient size and/or use of iterative reconstruction technique. COMPARISON:  11/20/2022. FINDINGS: Cardiovascular: The heart is normal in size and there is no pericardial effusion. Scattered coronary artery calcifications are noted. There is atherosclerotic calcification of the aorta without evidence of aneurysm. The aorta and pulmonary trunk are normal in caliber. Mediastinum/Nodes: No mediastinal or axillary lymphadenopathy. Evaluation of the hila is limited due to lack of IV contrast. Surgical clips are present in the thyroid bed. The trachea and esophagus are within normal limits. Lungs/Pleura: Centrilobular emphysematous changes are present in  the lungs. Bronchiectasis is noted in the left upper lobe with associated peribronchovascular nodular opacities, increased from the prior exam. Multifocal airspace disease is present in the posterior and anterior aspects of the right upper lobe and right middle lobe with associated peribronchovascular densities, with new airspace disease in the posterior aspect of the right upper lobe. The previously described right lower lobe pulmonary nodule is not seen. No effusion or pneumothorax. Upper Abdomen: Fatty infiltration of the liver is noted. No acute abnormality. Musculoskeletal: There is a stable 1.2 cm nodule in the right breast. Cervical spinal fusion hardware is noted. Degenerative changes are present in the thoracic spine. Spinal fusion hardware is noted in the lower thoracic spine. No acute osseous abnormality is seen. IMPRESSION: 1. New airspace disease in the right upper lobe with persistent airspace disease in the right middle lobe with increasing peribronchovascular nodularity as compared with the previous exam, which may be infectious or inflammatory. Three to six-month follow-up is recommended to document resolution and exclude the possibility of underlying neoplasm. 2. Emphysema. 3. Aortic atherosclerosis and coronary artery calcifications. 4. Stable right breast nodule.  Clinical correlation is recommended. 5. Hepatic steatosis. Electronically Signed   By: Thornell Sartorius M.D.   On: 03/05/2023 04:17   DG Chest 2 View  Result Date: 02/18/2023 CLINICAL DATA:  Cough and shortness of breath. EXAM: CHEST - 2 VIEW COMPARISON:  CT chest 11/20/2022 FINDINGS: Heart size appears normal. No pleural fluid or interstitial edema. Tiny nodules in the left mid lung correspond to areas of tree-in-bud nodularity noted on recent chest CT and likely reflect sequelae of chronic indolent atypical infection. New nodular density in the right mid lung measures 1.3 cm. No signs of pleural effusion or airspace consolidation.  Postoperative changes within the lower thoracic and lumbar spine noted. Status post ACDF. IMPRESSION: 1. No acute cardiopulmonary disease. 2. New nodular density in the right mid lung measures 1.3 cm. Recommend further evaluation with noncontrast chest CT. 3. Tiny nodules in the left mid lung correspond to areas of tree-in-bud nodularity noted on comparison choose chest CT and likely reflect sequelae of chronic indolent atypical infection. Electronically Signed   By: Signa Kell M.D.   On: 02/18/2023 16:15    Microbiology: Results for orders placed or performed during the hospital encounter of 03/08/23  SARS Coronavirus 2 by RT PCR (hospital order, performed in [facility] hospital lab) *cepheid single result test* Urine, Clean Catch     Status: None   Collection Time: 03/08/23  1:46 PM   Specimen: Urine, Clean Catch; Nasal Swab  Result Value Ref Range Status   SARS Coronavirus 2 by RT PCR NEGATIVE NEGATIVE Final    Comment: (NOTE) SARS-CoV-2 target  nucleic acids are NOT DETECTED.  The SARS-CoV-2 RNA is generally detectable in upper and lower respiratory specimens during the acute phase of infection. The lowest concentration of SARS-CoV-2 viral copies this assay can detect is 250 copies / mL. A negative result does not preclude SARS-CoV-2 infection and should not be used as the sole basis for treatment or other patient management decisions.  A negative result may occur with improper specimen collection / handling, submission of specimen other than nasopharyngeal swab, presence of viral mutation(s) within the areas targeted by this assay, and inadequate number of viral copies (<250 copies / mL). A negative result must be combined with clinical observations, patient history, and epidemiological information.  Fact Sheet for Patients:   RoadLapTop.co.za  Fact Sheet for Healthcare Providers: http://kim-miller.com/  This test is not yet approved  or  cleared by the [place] FDA and has been authorized for detection and/or diagnosis of SARS-CoV-2 by FDA under an Emergency Use Authorization (EUA).  This EUA will remain in effect (meaning this test can be used) for the duration of the COVID-19 declaration under Section 564(b)(1) of the Act, 21 U.S.C. section 360bbb-3(b)(1), unless the authorization is terminated or revoked sooner.  Performed at [facility], [address] [place], [place] [address]   Blood culture (routine x 2)     Status: None   Collection Time: 03/08/23  2:18 PM   Specimen: BLOOD  Result Value Ref Range Status   Specimen Description   Final    BLOOD RIGHT ANTECUBITAL Performed at [facility], [address] [place], [place] [address]    Special Requests   Final    BOTTLES DRAWN AEROBIC AND ANAEROBIC Blood Culture adequate volume Performed at [facility], [address] [place], [place] [address]    Culture   Final    NO GROWTH 5 DAYS Performed at [facility], [address] [place], [place] [address]    Report Status 03/13/2023 FINAL  Final  Blood culture (routine x 2)     Status: None   Collection Time: 03/08/23  2:28 PM   Specimen: BLOOD  Result Value Ref Range Status   Specimen Description   Final    BLOOD BLOOD RIGHT HAND Performed at [facility], [address] [place], [place] [address]    Special Requests   Final    BOTTLES DRAWN AEROBIC AND ANAEROBIC Blood Culture results may not be optimal due to an inadequate volume of blood received in culture bottles Performed at [facility], [address] [place], [place] [address]    Culture   Final    NO GROWTH 5 DAYS Performed at [facility], [address], [place], [place] [address]    Report Status 03/13/2023 FINAL  Final  MRSA Next Gen by PCR, Nasal     Status: None   Collection Time: 03/08/23  7:36 PM   Specimen: Nasal Mucosa; Nasal Swab  Result Value Ref  Range Status   MRSA by PCR Next Gen NOT DETECTED NOT DETECTED Final    Comment: (NOTE) The GeneXpert MRSA Assay (FDA approved for NASAL specimens only), is one component of a comprehensive MRSA colonization surveillance program. It is not intended to diagnose MRSA infection nor to guide or monitor treatment for MRSA infections. Test performance is not FDA approved in patients less than 4 years old. Performed at [facility], [address] [place], [place] [address]    Labs: CBC: Recent Labs  Lab 03/10/23 [phone] 03/11/23 0402 03/12/23 0419 03/13/23 0359 03/14/23 0403 03/15/23 0435  WBC 6.4 6.3 6.5 5.8 6.2 6.5  NEUTROABS 3.6  --   --   --  3.5 4.1  HGB 10.8* 10.6* 11.8* 11.3* 11.7* 12.1  HCT 33.2* 32.1* 36.3 34.8* 36.0 37.1  MCV 91.7 91.5 93.1 90.4 91.1 92.3  PLT 186 205 243 247 283 303   Basic Metabolic Panel: Recent Labs  Lab 03/10/23 0312 03/11/23 0402 03/12/23 0419 03/13/23 0359 03/14/23 0403 03/15/23 0435  NA 133* 134* 135 136 133* 136  K 3.4* 3.7 3.9 3.6 3.3* 4.0  CL 101 105 106 105 101 105  CO2 23 21* 21* 20* 21* 21*  GLUCOSE 110* 124* 137* 97 112* 154*  BUN 10 9 8 9 11 8   CREATININE 0.70 0.62 0.67 0.63 0.54 0.67  CALCIUM 7.6* 7.3* 7.7* 7.8* 7.9* 8.1*  MG 2.2  --   --   --  2.2 2.2  PHOS  --   --   --   --  3.7 2.7   Liver Function Tests: Recent Labs  Lab 03/14/23 0403 03/15/23 0435  AST 24 27  ALT 17 22  ALKPHOS 72 76  BILITOT 0.5 0.5  PROT 6.4* 6.3*  ALBUMIN 2.8* 2.9*   CBG: Recent Labs  Lab 03/14/23 1221 03/14/23 1634 03/14/23 2051 03/15/23 0740 03/15/23 1131  GLUCAP 167* 144* 143* 117* 154*   Discharge time spent: greater than 30 minutes.  Signed: Marguerita Merles, DO [facility] 03/16/2023

## 2023-03-18 ENCOUNTER — Telehealth: Payer: Self-pay

## 2023-03-18 ENCOUNTER — Encounter (HOSPITAL_COMMUNITY): Payer: Medicare Other | Admitting: Physical Therapy

## 2023-03-18 NOTE — Telephone Encounter (Signed)
Pt called back about her metformin giving her issues. Pt was d/c'd from hospital on 9.6.24 and is scheduled for HFU on 9.12.24 @ 320. After reviewing chart, noted that Dr. Laury Axon had recommended [facility] to try but no script was sent in. Not sure if we were waiting for her d/c before sending it in. Please advise.

## 2023-03-18 NOTE — Transitions of Care (Post Inpatient/ED Visit) (Signed)
03/18/2023  Name: Joan [name] MRN: [id number] DOB: Aug 12, 1945  Today's TOC FU Call Status: Today's TOC FU Call Status:: Successful TOC FU Call Completed TOC FU Call Complete Date: 03/18/23 Patient's Name and Date of Birth confirmed.  Transition Care Management Follow-up Telephone Call Date of Discharge: 03/15/23 Discharge Facility: [facility]) Type of Discharge: Inpatient Admission Primary Inpatient Discharge Diagnosis:: "multifocal PNA" How have you been since you were released from the hospital?: Better (Pt states she is "doing better but just tried and weak." She denies any resp ss-no SOB, no coughing. She finished abxs. LBM yes. States she stopped taking Metformin-was making her sick-has notified PCP and waiting to hear back on what to do/take) Any questions or concerns?: No  Items Reviewed: Did you receive and understand the discharge instructions provided?: Yes Medications obtained,verified, and reconciled?: Partial Review Completed Reason for Partial Mediation Review: pt did not wish to review all meds-states she is tired-discussed med changes-pt states "not taking Miralax or Mucinex as she doesn't need it", stopped Metformin due to sxs Any new allergies since your discharge?: No Dietary orders reviewed?: Yes Type of Diet Ordered:: low salt/heart healthy/carb modified Do you have support at home?: Yes People in Home: alone  Medications Reviewed Today: Medications Reviewed Today     Reviewed by Charlyn Minerva, RN (Registered Nurse) on 03/18/23 at 1223  Med List Status: <None>   Medication Order Taking? Sig Documenting Provider Last Dose Status Informant  Acetaminophen (TYLENOL 8 HOUR ARTHRITIS PAIN PO) [id number]  Take 1 tablet by mouth daily. [provider]  Active Self           Med Note Thomasene Mohair   Sat Mar 09, 2023  8:28 PM) Patient verified she is taking every day   albuterol (VENTOLIN HFA) 108 (90 Base) MCG/ACT inhaler  [id number]  Inhale 2 puffs into the lungs every 6 (six) hours as needed for wheezing or shortness of breath. Donato Schultz, DO  Active Self  carisoprodol (SOMA) 350 MG tablet [id number]  Take 175 mg by mouth 3 (three) times daily.  [provider]  Active Self  diclofenac Sodium (VOLTAREN) 1 % GEL [id number]  as needed. [provider]  Active Self  dorzolamide-timolol (COSOPT) 22.3-6.8 MG/ML ophthalmic solution [id number]  Place 1 drop into the left eye 2 (two) times daily. [provider]  Active Self           Med Note Kai Levins, MELISSA B   Fri Aug 23, 2017  6:01 PM)    estradiol (ESTRACE) 0.5 MG tablet [id number]  Take 0.5 mg by mouth at bedtime.  [provider]  Active Self  feeding supplement (ENSURE ENLIVE / ENSURE PLUS) LIQD [id number]  Take 237 mLs by mouth 2 (two) times daily between meals. Sheikh, Omair [place], DO  Active   fluticasone [facility]) 50 MCG/ACT nasal spray [id number]  Use 2 spray(s) in each nostril once daily  Patient taking differently: Place 1 spray into both nostrils daily as needed for allergies.   Donato Schultz, DO  Active Self  furosemide (LASIX) 40 MG tablet [id number]  Take 2 tablets (80 mg total) by mouth daily. Zola Button, Grayling Congress, DO  Active Self  guaiFENesin (MUCINEX) 600 MG 12 hr tablet [id number] No Take 1 tablet (600 mg total) by mouth 2 (two) times daily for 5 days.  Patient not taking: Reported on 03/18/2023   Merlene Laughter, DO Not Taking Active   ipratropium (ATROVENT) 0.06 %  nasal spray [id number]  Place 2 sprays into both nostrils 2 (two) times daily as needed for rhinitis. Luciano Cutter, MD  Active Self  levothyroxine (SYNTHROID) 88 MCG tablet [id number]  Take 1 tablet (88 mcg total) by mouth daily before breakfast. Shamleffer, Konrad Dolores, MD  Active Self  metFORMIN (GLUCOPHAGE) 500 MG tablet [id number] No Take 1 tablet (500 mg total) by mouth 2 (two) times daily with a meal.  Patient not taking:  Reported on 03/18/2023   Donato Schultz, DO Not Taking Active Self  NONFORMULARY OR COMPOUNDED ITEM [id number]  Compression socks  20-30 mm/ hg   Dx low ext edema Zola Button, Grayling Congress, DO  Active Self  omeprazole (PRILOSEC) 20 MG capsule [id number]  Take 2 capsules by mouth once daily Zola Button, Grayling Congress, DO  Active Self  oxyCODONE-acetaminophen (PERCOCET) 10-325 MG tablet [id number] Yes Take 1 tablet by mouth every 4 (four) hours as needed for pain (fill 01/15/23)  Taking Active Self  polyethylene glycol (MIRALAX / GLYCOLAX) 17 g packet [id number] No Take 17 g by mouth daily as needed for mild constipation.  Patient not taking: Reported on 03/18/2023   Merlene Laughter, DO Not Taking Active   potassium chloride SA (KLOR-CON M20) 20 MEQ tablet [id number]  TAKE 4 TABLETS BY MOUTH DAILY Donato Schultz, DO  Active Self  Probiotic Product (PROBIOTIC ACIDOPHILUS BEADS PO) [id number]  Take 1 tablet by mouth daily. [provider]  Active Self  simvastatin (ZOCOR) 40 MG tablet [id number]  Take 1 tablet (40 mg total) by mouth at bedtime. Donato Schultz, DO  Active Self  spironolactone (ALDACTONE) 25 MG tablet [id number]  Take 1 tablet (25 mg total) by mouth 2 (two) times daily. Zola Button, Grayling Congress, DO  Active Self  traZODone (DESYREL) 50 MG tablet [id number]  TAKE 1 TABLET BY MOUTH AT BEDTIME Donato Schultz, DO  Active Self  TRELEGY ELLIPTA 100-62.5-25 MCG/ACT AEPB [id number]  Inhale 1 puff into the lungs daily. Sheikh, Omair [place], [place]  Active   triamcinolone cream (KENALOG) 0.1 % [id number]  Apply 1 Application topically. [provider]  Active Self            Home Care and Equipment/Supplies: Were Home Health Services Ordered?: NA Any new equipment or medical supplies ordered?: NA  Functional Questionnaire: Do you need assistance with bathing/showering or dressing?: No Do you need assistance with meal preparation?: No Do you need assistance with eating?:  No Do you have difficulty maintaining continence: No Do you need assistance with getting out of bed/getting out of a chair/moving?: No Do you have difficulty managing or taking your medications?: No  Follow up appointments reviewed: PCP Follow-up appointment confirmed?: Yes Date of PCP follow-up appointment?: 03/21/23 Follow-up Provider: Dr. Zola Button Specialist [facility] Follow-up appointment confirmed?: NA Do you need transportation to your follow-up appointment?: No Do you understand care options if your condition(s) worsen?: Yes-patient verbalized understanding  SDOH Interventions Today    Flowsheet Row Most Recent Value  SDOH Interventions   Food Insecurity Interventions Intervention Not Indicated  Transportation Interventions Intervention Not Indicated      TOC Interventions Today    Flowsheet Row Most Recent Value  TOC Interventions   TOC Interventions Discussed/Reviewed TOC Interventions Discussed, S/S of infection      Interventions Today    Flowsheet Row Most Recent Value  Chronic Disease   Chronic disease during today's visit Diabetes  General Interventions   General Interventions  Discussed/Reviewed General Interventions Discussed, Doctor Visits, Referral to Nurse  [declined follow up call]  Doctor Visits Discussed/Reviewed Doctor Visits Discussed, PCP, Specialist  PCP/Specialist Visits Compliance with follow-up visit  Education Interventions   Education Provided Provided Education  Provided Verbal Education On Nutrition, When to see the doctor, Blood Sugar Monitoring  [pt voices she does not have a cbg meter-just recenty started on Metformin but has been unable to tolerate it-encouraged to discuss with provider during appt if she should be monitoring cbgs in the home]  Nutrition Interventions   Nutrition Discussed/Reviewed Nutrition Discussed, Fluid intake, Decreasing salt, Decreasing fats, Increasing proteins, Decreasing sugar intake, Adding fruits and  vegetables  Pharmacy Interventions   Pharmacy Dicussed/Reviewed Pharmacy Topics Discussed, Medications and their functions  Safety Interventions   Safety Discussed/Reviewed Safety Discussed       Alessandra Grout [facility] Care Management Care Management Community Coordinator Direct Phone: [phone] Toll Free: [phone] Fax: [phone]

## 2023-03-19 MED ORDER — EMPAGLIFLOZIN 10 MG PO TABS: 10.0000 mg | ORAL_TABLET | Freq: Every day | ORAL | 2 refills | Status: DC

## 2023-03-19 NOTE — Addendum Note (Signed)
Addended by: Roxanne Gates on: 03/19/2023 04:33 PM   Modules accepted: Orders

## 2023-03-19 NOTE — Telephone Encounter (Signed)
Spoke with patient. Pt verbalized understanding. Pt states she may not be able to afford it but med was sent to see how much it will cost.

## 2023-03-20 ENCOUNTER — Ambulatory Visit (HOSPITAL_COMMUNITY): Payer: Medicare Other | Admitting: Physical Therapy

## 2023-03-21 ENCOUNTER — Ambulatory Visit (INDEPENDENT_AMBULATORY_CARE_PROVIDER_SITE_OTHER): Payer: Medicare Other | Admitting: Family Medicine

## 2023-03-21 ENCOUNTER — Encounter: Payer: Self-pay | Admitting: Family Medicine

## 2023-03-21 ENCOUNTER — Other Ambulatory Visit: Payer: Self-pay | Admitting: Family

## 2023-03-21 VITALS — BP 120/80 | HR 88 | Temp 98.1°F | Resp 18 | Ht 60.0 in | Wt 164.2 lb

## 2023-03-21 DIAGNOSIS — J189 Pneumonia, unspecified organism: Secondary | ICD-10-CM

## 2023-03-21 DIAGNOSIS — Z7984 Long term (current) use of oral hypoglycemic drugs: Secondary | ICD-10-CM

## 2023-03-21 DIAGNOSIS — E1165 Type 2 diabetes mellitus with hyperglycemia: Secondary | ICD-10-CM | POA: Diagnosis not present

## 2023-03-21 DIAGNOSIS — E039 Hypothyroidism, unspecified: Secondary | ICD-10-CM

## 2023-03-21 MED ORDER — GLIMEPIRIDE 2 MG PO TABS
2.0000 mg | ORAL_TABLET | Freq: Every day | ORAL | 3 refills | Status: DC
Start: 2023-03-21 — End: 2023-06-21

## 2023-03-21 MED ORDER — BLOOD GLUCOSE MONITORING SUPPL DEVI: 1.0000 | Freq: Three times a day (TID) | 0 refills | Status: AC

## 2023-03-21 MED ORDER — BLOOD GLUCOSE MONITORING SUPPL DEVI
1.0000 | Freq: Three times a day (TID) | 0 refills | Status: DC
Start: 2023-03-21 — End: 2023-03-21

## 2023-03-21 MED ORDER — BLOOD GLUCOSE TEST VI STRP
1.0000 | ORAL_STRIP | Freq: Three times a day (TID) | 0 refills | Status: DC
Start: 2023-03-21 — End: 2023-04-19

## 2023-03-21 MED ORDER — LANCETS MISC. MISC
1.0000 | Freq: Three times a day (TID) | 0 refills | Status: AC
Start: 2023-03-21 — End: 2023-04-20

## 2023-03-21 MED ORDER — LANCET DEVICE MISC
1.0000 | Freq: Three times a day (TID) | 0 refills | Status: AC
Start: 2023-03-21 — End: 2023-04-20

## 2023-03-21 NOTE — Progress Notes (Signed)
Established Patient Office Visit  Subjective   Patient ID: Joan [name], female    DOB: August 15, 1945  Age: 77 y.o. MRN: [id number]  Chief Complaint  Patient presents with   Hospitalization Follow-up    HPI Discussed the use of AI scribe software for clinical note transcription with the patient, who gave verbal consent to proceed.  History of Present Illness   The patient, with a history of diabetes and recent hospitalization for pneumonia, presents for a post-hospitalization follow-up. She reports feeling "better than I was" but acknowledges that recovery is ongoing. She completed a two-day course of Cefdinir and Azithromycin, which she tolerated well. She denies cough and has discontinued Mucinex and Trelegy, the latter of which she believes caused her pneumonia. She has also stopped taking Jardiance due to cost and is seeking an alternative to Metformin due to gastrointestinal side effects.  The patient reports making dietary changes to manage her diabetes, including eliminating sweet drinks and ice cream, and incorporating fresh fruits into her diet. She acknowledges the need to monitor her blood sugar levels but currently lacks the necessary equipment.  In addition to her diabetes and recent pneumonia, the patient is also dealing with lymphedema. She was scheduled for rehabilitation but has decided to postpone due to cost and her current physical condition. She also mentions a scheduled CT scan in December.      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   Stasis dermatitis of both legs 10/04/2021   Hordeolum externum of left upper eyelid 10/04/2021   COVID-19 05/18/2021   COPD 03/16/2020   Multiple pulmonary nodules determined by computed tomography of lung 03/16/2020   Cigarette smoker 03/16/2020   Lower extremity edema  12/16/2019   S/P total hip arthroplasty 09/09/2019   Wound infection 05/28/2019   Hypokalemia 05/28/2019   OA (osteoarthritis) of hip 04/27/2019   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   History of arthroplasty of knee 09/24/2017   Insomnia 12/13/2016   Inflamed seborrheic keratosis 12/11/2016   Actinic dermatitis 12/11/2016   Melanocytic nevi, unspecified 12/26/2015   Ephelides 12/26/2015   Preventative health care 09/08/2014   Ovarian cyst 08/13/2014   Preoperative cardiovascular examination 08/13/2014   Abnormal EKG 08/13/2014   PVC's (premature ventricular contractions)    Actinic keratosis 05/04/2014   Fracture of left distal radius 12/30/2013   Spinal stenosis of lumbar region 06/24/2013   Atherosclerosis of aorta (HCC) 05/06/2013   Osteopenia 03/06/2013   Cellulitis of lower extremity 11/24/2012   Edema 11/05/2012   Myalgia 04/11/2011   Night sweats 12/27/2010   Contact lens/glasses fitting 12/27/2010   Menopause 12/27/2010   Family history of breast cancer 12/27/2010   ADJUSTMENT DISORDER WITH ANXIOUS MOOD 05/26/2010   PULMONARY NODULE, LEFT UPPER LOBE 12/15/2009   CONSTIPATION, DRUG INDUCED 08/19/2009   KNEE PAIN, LEFT 09/13/2008   THYROID NODULE, LEFT 05/26/2008   GERD 05/26/2008   INSOMNIA 05/06/2008   Anemia 10/24/2007   Hypothyroidism 08/12/2007   Hyperlipidemia 08/12/2007   TOBACCO ABUSE 08/12/2007   Primary hypertension 08/12/2007   OA (osteoarthritis) of knee 08/12/2007   Past Medical History:  Diagnosis Date   Allergy ?   Arthritis    "shoulders; wrist; probably spine" (06/24/2013)   Blood transfusion without reported diagnosis    Cancer (HCC)    skin cancer right leg x 4     Cataract  removed both eyes    Chronic low back pain    followed by Dr Vear Clock pain mgt   Colon polyp    Constipation    COPD (chronic obstructive pulmonary disease) (HCC)    mild   Esophageal stricture    Finger pain, left    2 fingers on left hand since wrist  surgery   GERD (gastroesophageal reflux disease)    Glaucoma    Heart murmur    "slight; not on RX" (06/24/2013)   History of cardiac arrhythmia    cardiologist- Traci Turner   Hx of colonic polyps 08/28/2004   Hyperlipemia    Hypertension    Hypothyroidism    Osteoarthritis of left knee    advanced   Osteoporosis ?   PONV (postoperative nausea and vomiting)    Pt reports symptoms are the result of gallbladder and cholecystectomy, not anesthesia   PVC's (premature ventricular contractions)    Sleep apnea 1990's   "tested; tried mask; couldn't stand it; told me as long as I slept on my side I'd be ok" (06/24/2013)   Thyroid nodule    Tobacco abuse    Past Surgical History:  Procedure Laterality Date   ABDOMINAL HYSTERECTOMY  1988   "partial" (06/24/2013)   ABDOMINAL HYSTERECTOMY     partial in 1988   ANKLE SURGERY Left 1995   "tendon repair" (06/24/2013)   BACK SURGERY     "think today was my 8th back OR" (06/24/2013)   CERVICAL SPINE SURGERY  2012   CESAREAN SECTION  1972   CHOLECYSTECTOMY N/A 04/16/2013   Procedure: LAPAROSCOPIC CHOLECYSTECTOMY ;  Surgeon: Wilmon Arms. Corliss Skains, MD;  Location: [facility];  Service: General;  Laterality: N/A;   COLONOSCOPY     ELBOW SURGERY  1990's   EYE SURGERY  ?   FOOT SURGERY Right 2012   SPUR REMOVED   FRACTURE SURGERY  ?   INFUSION PUMP IMPLANTATION  1990's   "implantablet morphine pump; took it out w/in 11 months   JOINT REPLACEMENT  ?   KNEE ARTHROSCOPY Left 1991; ~ 1993   LAPAROSCOPIC LYSIS OF ADHESIONS N/A 04/16/2013   Procedure: LAPAROSCOPIC LYSIS OF ADHESIONS;  Surgeon: Wilmon Arms. Corliss Skains, MD;  Location: [facility];  Service: General;  Laterality: N/A;   LUMBAR FUSION     and rods   LUMBAR LAMINECTOMY/DECOMPRESSION MICRODISCECTOMY N/A 11/28/2012   Procedure: DECOMPRESSIVE LUMBAR LAMINECTOMY LEVEL 1;  Surgeon: Mariam Dollar, MD;  Location: [facility];  Service: Neurosurgery;  Laterality: N/A;  DECOMPRESSIVE LUMBAR LAMINECTOMY LEVEL 1    ORIF DISTAL RADIUS FRACTURE Left 12/30/2013   dr Melvyn Novas   ORIF WRIST FRACTURE Left 12/30/2013   Procedure: OPEN REDUCTION INTERNAL FIXATION (ORIF) LEFT WRIST FRACTURE AND REPAIR AS INDICATED;  Surgeon: Sharma Covert, MD;  Location: [facility];  Service: Orthopedics;  Laterality: Left;   POLYPECTOMY     POSTERIOR FUSION LUMBAR SPINE  06/24/2013   ROBOTIC ASSISTED SALPINGO OOPHERECTOMY Bilateral 08/20/2014   Procedure: ROBOTIC ASSISTED SALPINGO OOPHORECTOMY;  Surgeon: Loney Laurence, MD;  Location: [facility];  Service: Gynecology;  Laterality: Bilateral;   SHOULDER ARTHROSCOPY Left 09/2011   SPINE SURGERY  8 times   THYROIDECTOMY  2012   TOTAL HIP ARTHROPLASTY Left 04/27/2019   Procedure: TOTAL HIP ARTHROPLASTY ANTERIOR APPROACH;  Surgeon: Ollen Gross, MD;  Location: [facility];  Service: Orthopedics;  Laterality: Left;    TOTAL HIP ARTHROPLASTY Right 09/09/2019   Procedure: TOTAL HIP ARTHROPLASTY ANTERIOR APPROACH;  Surgeon: Lequita Halt,  Homero Fellers, MD;  Location: [facility];  Service: Orthopedics;  Laterality: Right;    TOTAL KNEE ARTHROPLASTY Right 09/09/2017   Procedure: RIGHT TOTAL KNEE ARTHROPLASTY;  Surgeon: Ollen Gross, MD;  Location: [facility];  Service: Orthopedics;  Laterality: Right;   TUBAL LIGATION  ?   UPPER GASTROINTESTINAL ENDOSCOPY     VITRECTOMY Right 10/18/2020   Social History   Tobacco Use   Smoking status: Every Day    Current packs/day: 1.50    Average packs/day: 1.5 packs/day for 50.0 years (75.0 ttl pk-yrs)    Types: Cigarettes   Smokeless tobacco: Never   Tobacco comments:    10 cigarettes smoked daily ARJ 11/07/21  Vaping Use   Vaping status: Never Used  Substance Use Topics   Alcohol use: No    Alcohol/week: 0.0 standard drinks of alcohol   Drug use: No   Social History   Socioeconomic History   Marital status: Divorced    Spouse name: Not on file   Number of children: Not on file   Years of education: Not on file   Highest education level: 12th  grade  Occupational History   Not on file  Tobacco Use   Smoking status: Every Day    Current packs/day: 1.50    Average packs/day: 1.5 packs/day for 50.0 years (75.0 ttl pk-yrs)    Types: Cigarettes   Smokeless tobacco: Never   Tobacco comments:    10 cigarettes smoked daily ARJ 11/07/21  Vaping Use   Vaping status: Never Used  Substance and Sexual Activity   Alcohol use: No    Alcohol/week: 0.0 standard drinks of alcohol   Drug use: No   Sexual activity: Not Currently  Other Topics Concern   Not on file  Social History Narrative   Divorced   Current Smoker  1 ppd -  20 yrs      Alcohol use-no       [employer] - laid off       Physician roster:   Dr. Loraine Leriche Philips - pain management   Dr. Henderson Cloud - GYN   Dr. Wynetta Emery - Neurosurgery   Dr. Yetta Barre - dermatology   Dr. Melvyn Novas - orthopedics   Dr. Armanda Magic - cardiology   Dr. Miller-ophthalmology   Social Determinants of Health   Financial Resource Strain: Low Risk  (11/18/2022)   Overall Financial Resource Strain (CARDIA)    Difficulty of Paying Living Expenses: Not very hard  Food Insecurity: No Food Insecurity (03/18/2023)   Hunger Vital Sign    Worried About Running Out of Food in the Last Year: Never true    Ran Out of Food in the Last Year: Never true  Transportation Needs: No Transportation Needs (03/18/2023)   PRAPARE - Administrator, Civil Service (Medical): No    Lack of Transportation (Non-Medical): No  Physical Activity: Insufficiently Active (11/18/2022)   Exercise Vital Sign    Days of Exercise per Week: 2 days    Minutes of Exercise per Session: 10 min  Stress: No Stress Concern Present (11/18/2022)   [employer] of Occupational Health - Occupational Stress Questionnaire    Feeling of Stress : Not at all  Social Connections: Unknown (11/18/2022)   Social Connection and Isolation Panel [NHANES]    Frequency of Communication with Friends and Family: Twice a week    Frequency of  Social Gatherings with Friends and Family: Once a week    Attends Religious Services: Patient declined  Active Member of Clubs or Organizations: No    Attends Banker Meetings: Never    Marital Status: Divorced  Catering manager Violence: Not At Risk (03/08/2023)   Humiliation, Afraid, Rape, and Kick questionnaire    Fear of Current or Ex-Partner: No    Emotionally Abused: No    Physically Abused: No    Sexually Abused: No   Family Status  Relation Name Status   Father  Deceased at age 11   Mother  Deceased at age 2   Sister  Deceased       Died 10/13/15   Brother  Alive   Sister  Alive   Brother  Chemical engineer  (Not Specified)   Neg Hx  (Not Specified)  No partnership data on file   Family History  Problem Relation Age of Onset   Pancreatic cancer Father    Cancer Father    Diabetes type II Mother    Hypertension Mother    Coronary artery disease Mother 44   Diabetes Mother    Thyroid cancer Mother    Skin cancer Mother    Diabetes Sister    Hypertension Sister    Bone cancer Sister    Breast cancer Sister    Hypertension Brother    Breast cancer Maternal Aunt    Other Neg Hx        No family history of  colon cancer   Colon cancer Neg Hx    Colon polyps Neg Hx    Esophageal cancer Neg Hx    Stomach cancer Neg Hx    Rectal cancer Neg Hx    Allergies  Allergen Reactions   Metformin And Related Nausea And Vomiting   Morphine And Codeine Swelling   5-Alpha Reductase Inhibitors    Amitriptyline Swelling    Leg swelling   Augmentin [Amoxicillin-Pot Clavulanate] Nausea And Vomiting   Gabapentin Itching   Triamterene Itching and Rash      Review of Systems  Constitutional:  Negative for chills, fever and malaise/fatigue.  HENT:  Negative for congestion and hearing loss.   Eyes:  Negative for blurred vision and discharge.  Respiratory:  Negative for cough, sputum production and shortness of breath.   Cardiovascular:  Negative for  chest pain, palpitations and leg swelling.  Gastrointestinal:  Negative for abdominal pain, blood in stool, constipation, diarrhea, heartburn, nausea and vomiting.  Genitourinary:  Negative for dysuria, frequency, hematuria and urgency.  Musculoskeletal:  Negative for back pain, falls and myalgias.  Skin:  Negative for rash.  Neurological:  Negative for dizziness, sensory change, loss of consciousness, weakness and headaches.  Endo/Heme/Allergies:  Negative for environmental allergies. Does not bruise/bleed easily.  Psychiatric/Behavioral:  Negative for depression and suicidal ideas. The patient is not nervous/anxious and does not have insomnia.       Objective:     BP 120/80 (BP Location: Left Arm, Patient Position: Sitting, Cuff Size: Large)   Pulse 88   Temp 98.1 F (36.7 C) (Oral)   Resp 18   Ht 5' (1.524 m)   Wt 164 lb 3.2 oz (74.5 kg)   SpO2 94%   BMI 32.07 kg/m  BP Readings from Last 3 Encounters:  03/21/23 120/80  03/15/23 (!) 132/58  03/08/23 113/68   Wt Readings from Last 3 Encounters:  03/21/23 164 lb 3.2 oz (74.5 kg)  03/08/23 165 lb 5.5 oz (75 kg)  03/08/23 165 lb 6.4 oz (75 kg)   SpO2 Readings  from Last 3 Encounters:  03/21/23 94%  03/15/23 97%  03/08/23 95%      Physical Exam Vitals and nursing note reviewed.  Constitutional:      General: She is not in acute distress.    Appearance: Normal appearance. She is well-developed.  HENT:     Head: Normocephalic and atraumatic.     Right Ear: Tympanic membrane, ear canal and external ear normal. There is no impacted cerumen.     Left Ear: Tympanic membrane, ear canal and external ear normal. There is no impacted cerumen.     Nose: Nose normal.     Mouth/Throat:     Mouth: Mucous membranes are moist.     Pharynx: Oropharynx is clear. No oropharyngeal exudate or posterior oropharyngeal erythema.  Eyes:     General: No scleral icterus.       Right eye: No discharge.        Left eye: No discharge.      Conjunctiva/sclera: Conjunctivae normal.     Pupils: Pupils are equal, round, and reactive to light.  Neck:     Thyroid: No thyromegaly or thyroid tenderness.     Vascular: No JVD.  Cardiovascular:     Rate and Rhythm: Normal rate and regular rhythm.     Heart sounds: Normal heart sounds. No murmur heard. Pulmonary:     Effort: Pulmonary effort is normal. No respiratory distress.     Breath sounds: Wheezing present. No rhonchi.  Abdominal:     General: Bowel sounds are normal. There is no distension.     Palpations: Abdomen is soft. There is no mass.     Tenderness: There is no abdominal tenderness. There is no guarding or rebound.  Genitourinary:    Vagina: Normal.  Musculoskeletal:        General: Normal range of motion.     Cervical back: Normal range of motion and neck supple.     Right lower leg: No edema.     Left lower leg: No edema.  Lymphadenopathy:     Cervical: No cervical adenopathy.  Skin:    General: Skin is warm and dry.     Findings: No erythema or rash.  Neurological:     Mental Status: She is alert and oriented to person, place, and time.     Cranial Nerves: No cranial nerve deficit.     Deep Tendon Reflexes: Reflexes are normal and symmetric.  Psychiatric:        Mood and Affect: Mood normal.        Behavior: Behavior normal.        Thought Content: Thought content normal.        Judgment: Judgment normal.      No results found for any visits on 03/21/23.  Last CBC Lab Results  Component Value Date   WBC 6.5 03/15/2023   HGB 12.1 03/15/2023   HCT 37.1 03/15/2023   MCV 92.3 03/15/2023   MCH 30.1 03/15/2023   RDW 13.7 03/15/2023   PLT 303 03/15/2023   Last metabolic panel Lab Results  Component Value Date   GLUCOSE 154 (H) 03/15/2023   NA 136 03/15/2023   K 4.0 03/15/2023   CL 105 03/15/2023   CO2 21 (L) 03/15/2023   BUN 8 03/15/2023   CREATININE 0.67 03/15/2023   GFRNONAA >60 03/15/2023   CALCIUM 8.1 (L) 03/15/2023   PHOS 2.7  03/15/2023   PROT 6.3 (L) 03/15/2023   ALBUMIN 2.9 (L) 03/15/2023   BILITOT  0.5 03/15/2023   ALKPHOS 76 03/15/2023   AST 27 03/15/2023   ALT 22 03/15/2023   ANIONGAP 10 03/15/2023   Last lipids Lab Results  Component Value Date   CHOL 170 02/18/2023   HDL 50.60 02/18/2023   LDLCALC 93 06/18/2022   LDLDIRECT 96.0 02/18/2023   TRIG 221.0 (H) 02/18/2023   CHOLHDL 3 02/18/2023   Last hemoglobin A1c Lab Results  Component Value Date   HGBA1C 7.1 (H) 02/18/2023   Last thyroid functions Lab Results  Component Value Date   TSH 0.343 (L) 03/09/2023   T4TOTAL 13.2 (H) 08/13/2019   Last vitamin D No results found for: "25OHVITD2", "25OHVITD3", "VD25OH" Last vitamin B12 and Folate Lab Results  Component Value Date   VITAMINB12 325 03/15/2023   FOLATE 6.1 03/15/2023      The 10-year ASCVD risk score (Arnett DK, et al., 2019) is: 53.8%    Assessment & Plan:   Problem List Items Addressed This Visit       Unprioritized   Hypothyroidism   Relevant Orders   Thyroid Panel With TSH   Other Visit Diagnoses     Type 2 diabetes mellitus with hyperglycemia, without long-term current use of insulin (HCC)    -  Primary   Relevant Medications   Glucose Blood (BLOOD GLUCOSE TEST STRIPS) STRP   Lancet Device MISC   Lancets Misc. MISC   glimepiride (AMARYL) 2 MG tablet   Blood Glucose Monitoring Suppl DEVI   Community acquired pneumonia of right upper lobe of lung       Relevant Orders   DG Chest 2 View   Phosphorus   CBC with Differential/Platelet   Comprehensive metabolic panel   Magnesium     Assessment and Plan    Pneumonia Recent hospitalization for pneumonia. Completed a course of Cefdinir and Azithromycin. Patient has stopped taking Trelegy due to suspicion it caused the pneumonia. Wheezing noted on exam. -Continue monitoring symptoms. -Order chest x-ray in 4 weeks to assess resolution.  Type 2 Diabetes Mellitus Patient stopped taking Metformin due to  gastrointestinal side effects. Jardiance was too expensive. Patient has been making dietary changes to control blood sugar. -Initiate new generic medication for diabetes. -Provide prescription for glucose monitoring device and supplies. -Check blood glucose levels regularly, especially fasting levels. -Order labs today and in a month to monitor blood glucose control.  Hypothyroidism Patient is on Synthroid. Thyroid function was reportedly off during recent hospitalization. -Check thyroid function in a month.  Lymphedema Patient has postponed lymphedema treatment due to cost and current health status. -No immediate action. Reevaluate at future visits.  General Health Maintenance -Consider flu shot when patient is feeling better. -Follow up in a month for labs and chest x-ray.        Return in about 4 weeks (around 04/18/2023), or lab only.    Donato Schultz, DO

## 2023-03-21 NOTE — Patient Instructions (Signed)
Community-Acquired Pneumonia, Adult Pneumonia is a lung infection that causes inflammation and the buildup of mucus and fluids in the lungs. This may cause coughing and difficulty breathing. Community-acquired pneumonia is pneumonia that develops in people who are not, and have not recently been, in a hospital or other health care facility. Usually, pneumonia develops as a result of an illness that is caused by a virus, such as the common cold and the flu (influenza). It can also be caused by bacteria or fungi. While the common cold and influenza can pass from person to person (are contagious), pneumonia itself is not considered contagious. What are the causes? This condition may be caused by: Viruses. Bacteria. Fungi. What increases the risk? The following factors may make you more likely to develop this condition: Being over age 65 or having certain medical conditions, such as: A long-term (chronic) disease, such as: chronic obstructive pulmonary disease (COPD), asthma, heart failure, diabetes, or kidney disease. A condition that increases the risk of breathing in (aspirating) mucus and other fluids from your mouth and nose. A weakened body defense system (immune system). Having had your spleen removed (splenectomy). The spleen is the organ that helps fight germs and infections. Not cleaning your teeth and gums well (poor dental hygiene). Using tobacco products. Traveling to places where germs that cause pneumonia are present or being near certain animals or animal habitats that could have germs that cause pneumonia. What are the signs or symptoms? Symptoms of this condition include: A dry cough or a wet (productive) cough. A fever, sweating, or chills. Chest pain, especially when breathing deeply or coughing. Fast breathing, difficulty breathing, or shortness of breath. Tiredness (fatigue) and muscle aches. How is this diagnosed? This condition may be diagnosed based on your medical  history or a physical exam. You may also have tests, including: Imaging, such as a chest X-ray or lung ultrasound. Tests of: The level of oxygen and other gases in your blood. Mucus from your lungs (sputum). Fluid around your lungs (pleural fluid). Your urine. How is this treated? Treatment for this condition depends on many factors, such as the cause of your pneumonia, your medicines, and other medical conditions that you have. For most adults, pneumonia may be treated at home. In some cases, treatment must happen in a hospital and may include: Medicines that are given by mouth (orally) or through an IV, including: Antibiotic medicines, if bacteria caused the pneumonia. Medicines that kill viruses (antiviral medicines), if a virus caused the pneumonia. Oxygen therapy. Severe pneumonia, although rare, may require the following treatments: Mechanical ventilation.This procedure uses a machine to help you breathe if you cannot breathe well on your own or maintain a safe level of blood oxygen. Thoracentesis. This procedure removes any buildup of pleural fluid to help with breathing. Follow these instructions at home:  Medicines Take over-the-counter and prescription medicines only as told by your health care provider. Take cough medicine only if you have trouble sleeping. Cough medicine can prevent your body from removing mucus from your lungs. If you were prescribed antibiotics, take them as told by your health care provider. Do not stop taking the antibiotic even if you start to feel better. Lifestyle     Do not drink alcohol. Do not use any products that contain nicotine or tobacco. These products include cigarettes, chewing tobacco, and vaping devices, such as e-cigarettes. If you need help quitting, ask your health care provider. Eat a healthy diet. This includes plenty of vegetables, fruits, whole grains, low-fat   dairy products, and lean protein. General instructions Rest a lot and  get at least 8 hours of sleep each night. Sleep in a partly upright position at night. Place a few pillows under your head or sleep in a reclining chair. Return to your normal activities as told by your health care provider. Ask your health care provider what activities are safe for you. Drink enough fluid to keep your urine pale yellow. This helps to thin the mucus in your lungs. If your throat is sore, gargle with a mixture of salt and water 3-4 times a day or as needed. To make salt water, completely dissolve -1 tsp (3-6 g) of salt in 1 cup (237 mL) of warm water. Keep all follow-up visits. How is this prevented? You can lower your risk of developing community-acquired pneumonia by: Getting the pneumonia vaccine. There are different types and schedules of pneumonia vaccines. Ask your health care provider which option is best for you. Consider getting the pneumonia vaccine if: You are older than 78 years of age. You are 19-65 years of age and are receiving cancer treatment, have chronic lung disease, or have other medical conditions that affect your immune system. Ask your health care provider if this applies to you. Getting your influenza vaccine every year. Ask your health care provider which type of vaccine is best for you. Getting regular dental checkups. Washing your hands often with soap and water for at least 20 seconds. If soap and water are not available, use hand sanitizer. Contact a health care provider if: You have a fever. You have trouble sleeping because you cannot control your cough with cough medicine. Get help right away if: Your shortness of breath becomes worse. Your chest pain increases. Your sickness becomes worse, especially if you are an older adult or have a weak immune system. You cough up blood. These symptoms may be an emergency. Get help right away. Call 911. Do not wait to see if the symptoms will go away. Do not drive yourself to the  hospital. Summary Pneumonia is an infection of the lungs. Community-acquired pneumonia develops in people who have not been in the hospital. It can be caused by bacteria, viruses, or fungi. This condition may be treated with antibiotics or antiviral medicines. Severe pneumonia may require a hospital stay and treatment to help with breathing. This information is not intended to replace advice given to you by your health care provider. Make sure you discuss any questions you have with your health care provider. Document Revised: 08/23/2021 Document Reviewed: 08/23/2021 Elsevier Patient Education  2024 [employer].  

## 2023-03-22 ENCOUNTER — Encounter (HOSPITAL_COMMUNITY): Payer: Medicare Other | Admitting: Physical Therapy

## 2023-03-22 LAB — CBC WITH DIFFERENTIAL/PLATELET
Basophils Absolute: 0.1 10*3/uL (ref 0.0–0.1)
Basophils Relative: 1.1 % (ref 0.0–3.0)
Eosinophils Absolute: 0.1 10*3/uL (ref 0.0–0.7)
Eosinophils Relative: 1.5 % (ref 0.0–5.0)
HCT: 41 % (ref 36.0–46.0)
Hemoglobin: 13.3 g/dL (ref 12.0–15.0)
Lymphocytes Relative: 17.8 % (ref 12.0–46.0)
Lymphs Abs: 1.4 10*3/uL (ref 0.7–4.0)
MCHC: 32.5 g/dL (ref 30.0–36.0)
MCV: 91.1 fl (ref 78.0–100.0)
Monocytes Absolute: 1 10*3/uL (ref 0.1–1.0)
Monocytes Relative: 13.3 % — ABNORMAL HIGH (ref 3.0–12.0)
Neutro Abs: 5.1 10*3/uL (ref 1.4–7.7)
Neutrophils Relative %: 66.3 % (ref 43.0–77.0)
Platelets: 395 10*3/uL (ref 150.0–400.0)
RBC: 4.5 Mil/uL (ref 3.87–5.11)
RDW: 14.1 % (ref 11.5–15.5)
WBC: 7.6 10*3/uL (ref 4.0–10.5)

## 2023-03-22 LAB — COMPREHENSIVE METABOLIC PANEL
ALT: 18 U/L (ref 0–35)
AST: 30 U/L (ref 0–37)
Albumin: 4 g/dL (ref 3.5–5.2)
Alkaline Phosphatase: 112 U/L (ref 39–117)
BUN: 12 mg/dL (ref 6–23)
CO2: 25 meq/L (ref 19–32)
Calcium: 9.1 mg/dL (ref 8.4–10.5)
Chloride: 101 meq/L (ref 96–112)
Creatinine, Ser: 0.86 mg/dL (ref 0.40–1.20)
GFR: 64.74 mL/min (ref >60.00)
Glucose, Bld: 125 mg/dL — ABNORMAL HIGH (ref 70–99)
Potassium: 4.7 meq/L (ref 3.5–5.1)
Sodium: 137 meq/L (ref 135–145)
Total Bilirubin: 0.4 mg/dL (ref 0.2–1.2)
Total Protein: 6.8 g/dL (ref 6.0–8.3)

## 2023-03-22 LAB — MAGNESIUM: Magnesium: 1.6 mg/dL (ref 1.5–2.5)

## 2023-03-22 LAB — PHOSPHORUS: Phosphorus: 3 mg/dL (ref 2.3–4.6)

## 2023-03-25 ENCOUNTER — Encounter (HOSPITAL_COMMUNITY): Payer: Medicare Other | Admitting: Physical Therapy

## 2023-03-27 ENCOUNTER — Encounter (HOSPITAL_COMMUNITY): Payer: Medicare Other

## 2023-03-29 ENCOUNTER — Encounter (HOSPITAL_COMMUNITY): Payer: Medicare Other

## 2023-04-01 ENCOUNTER — Encounter (HOSPITAL_COMMUNITY): Payer: Medicare Other | Admitting: Physical Therapy

## 2023-04-03 ENCOUNTER — Encounter (HOSPITAL_COMMUNITY): Payer: Medicare Other

## 2023-04-05 ENCOUNTER — Encounter (HOSPITAL_COMMUNITY): Payer: Medicare Other

## 2023-04-05 ENCOUNTER — Other Ambulatory Visit (HOSPITAL_COMMUNITY): Payer: Self-pay

## 2023-04-05 MED ORDER — OXYCODONE-ACETAMINOPHEN 10-325 MG PO TABS
1.0000 | ORAL_TABLET | ORAL | 0 refills | Status: DC | PRN
Start: 2023-04-05 — End: 2023-07-30
  Filled 2023-04-05: qty 160, 27d supply, fill #0

## 2023-04-08 ENCOUNTER — Encounter (HOSPITAL_COMMUNITY): Payer: Medicare Other | Admitting: Physical Therapy

## 2023-04-10 ENCOUNTER — Encounter (HOSPITAL_COMMUNITY): Payer: Medicare Other | Admitting: Physical Therapy

## 2023-04-12 ENCOUNTER — Ambulatory Visit (HOSPITAL_COMMUNITY): Payer: Medicare Other | Admitting: Physical Therapy

## 2023-04-15 ENCOUNTER — Encounter (HOSPITAL_COMMUNITY): Payer: Medicare Other | Admitting: Physical Therapy

## 2023-04-17 ENCOUNTER — Encounter (HOSPITAL_COMMUNITY): Payer: Medicare Other | Admitting: Physical Therapy

## 2023-04-18 ENCOUNTER — Other Ambulatory Visit (HOSPITAL_COMMUNITY): Payer: Self-pay

## 2023-04-18 ENCOUNTER — Other Ambulatory Visit: Payer: Self-pay | Admitting: Family Medicine

## 2023-04-18 DIAGNOSIS — Z79891 Long term (current) use of opiate analgesic: Secondary | ICD-10-CM | POA: Diagnosis not present

## 2023-04-18 DIAGNOSIS — M961 Postlaminectomy syndrome, not elsewhere classified: Secondary | ICD-10-CM | POA: Diagnosis not present

## 2023-04-18 DIAGNOSIS — G894 Chronic pain syndrome: Secondary | ICD-10-CM | POA: Diagnosis not present

## 2023-04-18 DIAGNOSIS — E1165 Type 2 diabetes mellitus with hyperglycemia: Secondary | ICD-10-CM

## 2023-04-18 DIAGNOSIS — M62830 Muscle spasm of back: Secondary | ICD-10-CM | POA: Diagnosis not present

## 2023-04-18 MED ORDER — OXYCODONE-ACETAMINOPHEN 10-325 MG PO TABS
1.0000 | ORAL_TABLET | ORAL | 0 refills | Status: DC | PRN
  Filled 2023-05-08: qty 160, 27d supply, fill #0

## 2023-04-19 ENCOUNTER — Other Ambulatory Visit (HOSPITAL_COMMUNITY): Payer: Self-pay

## 2023-04-19 ENCOUNTER — Encounter (HOSPITAL_COMMUNITY): Payer: Medicare Other | Admitting: Physical Therapy

## 2023-04-22 ENCOUNTER — Encounter (HOSPITAL_COMMUNITY): Payer: Medicare Other | Admitting: Physical Therapy

## 2023-04-23 ENCOUNTER — Ambulatory Visit (HOSPITAL_BASED_OUTPATIENT_CLINIC_OR_DEPARTMENT_OTHER)
Admission: RE | Admit: 2023-04-23 | Discharge: 2023-04-23 | Disposition: A | Discharge status: Home / Self Care | Payer: Medicare Other | Source: Ambulatory Visit | Attending: Family Medicine | Admitting: Family Medicine

## 2023-04-23 ENCOUNTER — Other Ambulatory Visit (INDEPENDENT_AMBULATORY_CARE_PROVIDER_SITE_OTHER): Payer: Medicare Other

## 2023-04-23 ENCOUNTER — Telehealth: Payer: Self-pay | Admitting: Family Medicine

## 2023-04-23 ENCOUNTER — Other Ambulatory Visit (HOSPITAL_BASED_OUTPATIENT_CLINIC_OR_DEPARTMENT_OTHER): Payer: Self-pay

## 2023-04-23 DIAGNOSIS — E039 Hypothyroidism, unspecified: Secondary | ICD-10-CM | POA: Diagnosis not present

## 2023-04-23 DIAGNOSIS — J189 Pneumonia, unspecified organism: Secondary | ICD-10-CM | POA: Diagnosis present

## 2023-04-23 DIAGNOSIS — R0689 Other abnormalities of breathing: Secondary | ICD-10-CM | POA: Diagnosis not present

## 2023-04-23 MED ORDER — FLUAD 0.5 ML IM SUSY
0.5000 mL | PREFILLED_SYRINGE | Freq: Once | INTRAMUSCULAR | 0 refills | Status: AC
  Filled 2023-04-23: qty 0.5, 1d supply, fill #0

## 2023-04-23 MED ORDER — COMIRNATY 30 MCG/0.3ML IM SUSY
0.3000 mL | PREFILLED_SYRINGE | Freq: Once | INTRAMUSCULAR | 0 refills | Status: AC
  Filled 2023-04-23: qty 0.3, 1d supply, fill #0

## 2023-04-23 NOTE — Telephone Encounter (Signed)
Received! Placed in folder.

## 2023-04-23 NOTE — Telephone Encounter (Signed)
Pt came in office and dropped off BP reading for provider to see and have on pts chart. Document put at front office tray under providers name.

## 2023-04-23 NOTE — Progress Notes (Signed)
t

## 2023-04-24 ENCOUNTER — Encounter (HOSPITAL_COMMUNITY): Payer: Medicare Other | Admitting: Physical Therapy

## 2023-04-24 ENCOUNTER — Other Ambulatory Visit (HOSPITAL_BASED_OUTPATIENT_CLINIC_OR_DEPARTMENT_OTHER): Payer: Self-pay

## 2023-04-24 LAB — THYROID PANEL WITH TSH
Free Thyroxine Index: 3 (ref 1.4–3.8)
T3 Uptake: 30 % (ref 22–35)
T4, Total: 10 ug/dL (ref 5.1–11.9)
TSH: 2.9 m[IU]/L (ref 0.40–4.50)

## 2023-04-26 ENCOUNTER — Encounter (HOSPITAL_COMMUNITY): Payer: Medicare Other | Admitting: Physical Therapy

## 2023-04-28 ENCOUNTER — Other Ambulatory Visit: Payer: Self-pay | Admitting: Family Medicine

## 2023-04-29 ENCOUNTER — Encounter (HOSPITAL_COMMUNITY): Payer: Medicare Other | Admitting: Physical Therapy

## 2023-04-30 ENCOUNTER — Encounter: Payer: Self-pay | Admitting: Family Medicine

## 2023-04-30 ENCOUNTER — Other Ambulatory Visit: Payer: Self-pay | Admitting: Family Medicine

## 2023-04-30 DIAGNOSIS — R600 Localized edema: Secondary | ICD-10-CM

## 2023-05-01 ENCOUNTER — Encounter (HOSPITAL_COMMUNITY): Payer: Medicare Other

## 2023-05-02 ENCOUNTER — Other Ambulatory Visit: Payer: Self-pay | Admitting: Family Medicine

## 2023-05-03 ENCOUNTER — Encounter (HOSPITAL_COMMUNITY): Payer: Medicare Other | Admitting: Physical Therapy

## 2023-05-06 ENCOUNTER — Encounter (HOSPITAL_COMMUNITY): Payer: Medicare Other | Admitting: Physical Therapy

## 2023-05-08 ENCOUNTER — Other Ambulatory Visit (HOSPITAL_COMMUNITY): Payer: Self-pay

## 2023-05-08 ENCOUNTER — Encounter (HOSPITAL_COMMUNITY): Payer: Medicare Other

## 2023-05-09 ENCOUNTER — Other Ambulatory Visit (HOSPITAL_COMMUNITY): Payer: Self-pay

## 2023-05-10 ENCOUNTER — Encounter (HOSPITAL_COMMUNITY): Payer: Medicare Other | Admitting: Physical Therapy

## 2023-05-13 ENCOUNTER — Encounter (HOSPITAL_COMMUNITY): Payer: Medicare Other | Admitting: Physical Therapy

## 2023-05-14 ENCOUNTER — Other Ambulatory Visit: Payer: Self-pay | Admitting: Family Medicine

## 2023-05-14 DIAGNOSIS — L308 Other specified dermatitis: Secondary | ICD-10-CM | POA: Diagnosis not present

## 2023-05-14 DIAGNOSIS — E876 Hypokalemia: Secondary | ICD-10-CM

## 2023-05-14 DIAGNOSIS — K219 Gastro-esophageal reflux disease without esophagitis: Secondary | ICD-10-CM

## 2023-05-14 DIAGNOSIS — I872 Venous insufficiency (chronic) (peripheral): Secondary | ICD-10-CM | POA: Diagnosis not present

## 2023-05-14 DIAGNOSIS — Z85828 Personal history of other malignant neoplasm of skin: Secondary | ICD-10-CM | POA: Diagnosis not present

## 2023-05-15 ENCOUNTER — Encounter (HOSPITAL_COMMUNITY): Payer: Medicare Other | Admitting: Physical Therapy

## 2023-05-17 ENCOUNTER — Encounter (HOSPITAL_COMMUNITY): Payer: Medicare Other

## 2023-05-21 ENCOUNTER — Telehealth (HOSPITAL_BASED_OUTPATIENT_CLINIC_OR_DEPARTMENT_OTHER): Payer: Self-pay | Admitting: Pulmonary Disease

## 2023-05-21 ENCOUNTER — Other Ambulatory Visit (INDEPENDENT_AMBULATORY_CARE_PROVIDER_SITE_OTHER): Payer: Medicare Other

## 2023-05-21 DIAGNOSIS — R739 Hyperglycemia, unspecified: Secondary | ICD-10-CM | POA: Diagnosis not present

## 2023-05-21 DIAGNOSIS — E785 Hyperlipidemia, unspecified: Secondary | ICD-10-CM

## 2023-05-21 DIAGNOSIS — I10 Essential (primary) hypertension: Secondary | ICD-10-CM | POA: Diagnosis not present

## 2023-05-21 DIAGNOSIS — R609 Edema, unspecified: Secondary | ICD-10-CM

## 2023-05-21 LAB — COMPREHENSIVE METABOLIC PANEL
ALT: 18 U/L (ref 0–35)
AST: 18 U/L (ref 0–37)
Albumin: 4.1 g/dL (ref 3.5–5.2)
Alkaline Phosphatase: 93 U/L (ref 39–117)
BUN: 15 mg/dL (ref 6–23)
CO2: 27 meq/L (ref 19–32)
Calcium: 8.8 mg/dL (ref 8.4–10.5)
Chloride: 101 meq/L (ref 96–112)
Creatinine, Ser: 0.92 mg/dL (ref 0.40–1.20)
GFR: 59.63 mL/min — ABNORMAL LOW (ref >60.00)
Glucose, Bld: 181 mg/dL — ABNORMAL HIGH (ref 70–99)
Potassium: 4.5 meq/L (ref 3.5–5.1)
Sodium: 136 meq/L (ref 135–145)
Total Bilirubin: 0.4 mg/dL (ref 0.2–1.2)
Total Protein: 6.5 g/dL (ref 6.0–8.3)

## 2023-05-21 LAB — LIPID PANEL
Cholesterol: 161 mg/dL (ref 0–200)
HDL: 52.4 mg/dL (ref >39.00)
LDL Cholesterol: 66 mg/dL (ref 0–99)
NonHDL: 108.15
Total CHOL/HDL Ratio: 3
Triglycerides: 212 mg/dL — ABNORMAL HIGH (ref 0.0–149.0)
VLDL: 42.4 mg/dL — ABNORMAL HIGH (ref 0.0–40.0)

## 2023-05-21 LAB — HEMOGLOBIN A1C: Hgb A1c MFr Bld: 6.5 % (ref 4.6–6.5)

## 2023-05-21 LAB — TSH: TSH: 11.34 u[IU]/mL — ABNORMAL HIGH (ref 0.35–5.50)

## 2023-05-21 NOTE — Telephone Encounter (Signed)
Patient called to let us know that she does not like doing CTs at [facility]. She wants to schedule at [facility] in [place]. Her followup with JE is on 07/30/23. She prefers afternoon appointments. Please call her to schedule as she has several other appointments to work around.

## 2023-05-22 LAB — CBC WITH DIFFERENTIAL/PLATELET
Basophils Absolute: 0 10*3/uL (ref 0.0–0.1)
Basophils Relative: 0.7 % (ref 0.0–3.0)
Eosinophils Absolute: 0.1 10*3/uL (ref 0.0–0.7)
Eosinophils Relative: 1.7 % (ref 0.0–5.0)
HCT: 39.2 % (ref 36.0–46.0)
Hemoglobin: 12.9 g/dL (ref 12.0–15.0)
Lymphocytes Relative: 23.7 % (ref 12.0–46.0)
Lymphs Abs: 1.2 10*3/uL (ref 0.7–4.0)
MCHC: 32.9 g/dL (ref 30.0–36.0)
MCV: 91.3 fL (ref 78.0–100.0)
Monocytes Absolute: 0.7 10*3/uL (ref 0.1–1.0)
Monocytes Relative: 12.7 % — ABNORMAL HIGH (ref 3.0–12.0)
Neutro Abs: 3.2 10*3/uL (ref 1.4–7.7)
Neutrophils Relative %: 61.2 % (ref 43.0–77.0)
Platelets: 247 10*3/uL (ref 150.0–400.0)
RBC: 4.3 Mil/uL (ref 3.87–5.11)
RDW: 14.2 % (ref 11.5–15.5)
WBC: 5.2 10*3/uL (ref 4.0–10.5)

## 2023-05-24 ENCOUNTER — Telehealth: Payer: Self-pay | Admitting: Family Medicine

## 2023-05-24 ENCOUNTER — Other Ambulatory Visit: Payer: Self-pay

## 2023-05-24 MED ORDER — LEVOTHYROXINE SODIUM 100 MCG PO TABS
100.0000 ug | ORAL_TABLET | Freq: Every day | ORAL | 1 refills | Status: DC
Start: 2023-05-24 — End: 2023-11-20

## 2023-05-24 NOTE — Telephone Encounter (Signed)
Pt called. New rx sent to pharmacy.

## 2023-05-24 NOTE — Telephone Encounter (Signed)
Pt called and stated that PCP put on her chart to increase synthroid medication to 100 mcg daily. She requested to speak with a nurse for clarification on how she is supposed to take that since a new script was not sent in. Please call and advise.

## 2023-05-28 ENCOUNTER — Other Ambulatory Visit: Payer: Self-pay | Admitting: Family Medicine

## 2023-05-28 DIAGNOSIS — G4700 Insomnia, unspecified: Secondary | ICD-10-CM

## 2023-06-04 ENCOUNTER — Other Ambulatory Visit: Payer: Medicare Other

## 2023-06-07 ENCOUNTER — Other Ambulatory Visit (HOSPITAL_COMMUNITY): Payer: Self-pay

## 2023-06-10 ENCOUNTER — Other Ambulatory Visit (HOSPITAL_COMMUNITY): Payer: Self-pay

## 2023-06-10 ENCOUNTER — Other Ambulatory Visit: Payer: Self-pay

## 2023-06-10 MED ORDER — OXYCODONE-ACETAMINOPHEN 10-325 MG PO TABS
1.0000 | ORAL_TABLET | ORAL | 0 refills | Status: DC | PRN
  Filled 2023-06-10: qty 160, 27d supply, fill #0

## 2023-06-11 ENCOUNTER — Other Ambulatory Visit (HOSPITAL_COMMUNITY): Payer: Self-pay

## 2023-06-13 ENCOUNTER — Other Ambulatory Visit (HOSPITAL_COMMUNITY): Payer: Self-pay

## 2023-06-13 DIAGNOSIS — Z79891 Long term (current) use of opiate analgesic: Secondary | ICD-10-CM | POA: Diagnosis not present

## 2023-06-13 DIAGNOSIS — M62830 Muscle spasm of back: Secondary | ICD-10-CM | POA: Diagnosis not present

## 2023-06-13 DIAGNOSIS — M961 Postlaminectomy syndrome, not elsewhere classified: Secondary | ICD-10-CM | POA: Diagnosis not present

## 2023-06-13 DIAGNOSIS — G894 Chronic pain syndrome: Secondary | ICD-10-CM | POA: Diagnosis not present

## 2023-06-13 MED ORDER — OXYCODONE-ACETAMINOPHEN 10-325 MG PO TABS
1.0000 | ORAL_TABLET | ORAL | 0 refills | Status: AC | PRN
  Filled 2023-07-11: qty 160, 27d supply, fill #0

## 2023-06-21 ENCOUNTER — Telehealth: Payer: Self-pay | Admitting: *Deleted

## 2023-06-21 DIAGNOSIS — E1165 Type 2 diabetes mellitus with hyperglycemia: Secondary | ICD-10-CM

## 2023-06-21 MED ORDER — GLIMEPIRIDE 2 MG PO TABS: 2.0000 mg | ORAL_TABLET | Freq: Every day | ORAL | 1 refills | Status: DC

## 2023-06-21 NOTE — Telephone Encounter (Signed)
Pt called, stating she is completely out.

## 2023-06-21 NOTE — Telephone Encounter (Signed)
Rx refilled.

## 2023-06-24 NOTE — Telephone Encounter (Signed)
Patient still has not heard from anyone to schedule CT scan. Patient has an upcoming appointment with Dr. Everardo All on 07/30/23. Please advise

## 2023-06-24 NOTE — Telephone Encounter (Signed)
Rescheduled her appointment to [facility] in December. Contacted Patient to give them more information on their appoinment. Will Send out reminder letter.

## 2023-07-07 ENCOUNTER — Ambulatory Visit (HOSPITAL_COMMUNITY)
Admission: RE | Admit: 2023-07-07 | Discharge: 2023-07-07 | Disposition: A | Discharge status: Home / Self Care | Payer: Medicare Other | Source: Ambulatory Visit | Attending: Pulmonary Disease | Admitting: Pulmonary Disease

## 2023-07-07 DIAGNOSIS — R9389 Abnormal findings on diagnostic imaging of other specified body structures: Secondary | ICD-10-CM | POA: Diagnosis not present

## 2023-07-07 DIAGNOSIS — J439 Emphysema, unspecified: Secondary | ICD-10-CM | POA: Diagnosis not present

## 2023-07-07 DIAGNOSIS — R918 Other nonspecific abnormal finding of lung field: Secondary | ICD-10-CM | POA: Diagnosis not present

## 2023-07-07 DIAGNOSIS — I700 Atherosclerosis of aorta: Secondary | ICD-10-CM | POA: Diagnosis not present

## 2023-07-09 ENCOUNTER — Other Ambulatory Visit: Payer: Medicare Other

## 2023-07-11 ENCOUNTER — Other Ambulatory Visit (HOSPITAL_COMMUNITY): Payer: Self-pay

## 2023-07-13 ENCOUNTER — Other Ambulatory Visit (HOSPITAL_COMMUNITY): Payer: Self-pay

## 2023-07-29 ENCOUNTER — Other Ambulatory Visit: Payer: Self-pay | Admitting: Family Medicine

## 2023-07-29 DIAGNOSIS — R600 Localized edema: Secondary | ICD-10-CM

## 2023-07-30 ENCOUNTER — Ambulatory Visit (HOSPITAL_BASED_OUTPATIENT_CLINIC_OR_DEPARTMENT_OTHER): Payer: Medicare Other | Admitting: Pulmonary Disease

## 2023-07-30 ENCOUNTER — Encounter (HOSPITAL_BASED_OUTPATIENT_CLINIC_OR_DEPARTMENT_OTHER): Payer: Self-pay | Admitting: Pulmonary Disease

## 2023-07-30 VITALS — BP 120/80 | HR 87 | Ht 60.0 in | Wt 164.0 lb

## 2023-07-30 DIAGNOSIS — R918 Other nonspecific abnormal finding of lung field: Secondary | ICD-10-CM | POA: Diagnosis not present

## 2023-07-30 NOTE — Progress Notes (Signed)
Subjective:   PATIENT ID: Joan [name] GENDER: female DOB: January 10, 1946, MRN: [id number]   HPI  Chief Complaint  Patient presents with   COPD    Reason for Visit: Follow-up  [name] is a 78 year old female active smoker with COPD, pulmonary nodules, GERD, hypothyroidism, osteopenia, glaucoma, HTN, HLD hx PVC and chronic lymphedema who presents for follow-up  Initial consult Previously seen by Dr. Delton Coombes for initial consult for COPD, hypoxemia dx by her PCP and CT changes concerning for atypical infection  Denies shortness of breath, cough or wheezing. No limitations in activity except related to her knee problems. She has been on Trelegy but does not feel like it is effective. When her ICS strength was increased, she has had worsening congestion. She is also worried about the side effects related to Trelegy including glaucoma and steroids.  Reported hypoxemia with PCP. However ambulatory O2 in pulmonary with no desaturations on 11/07/21. She reports ONO in May 2023  She currently smokes 1/2 ppd. Previously 1 1/2 ppd. Started at 78 years old. ~ 50 years smoking.  05/25/22 Breo was ineffective. Now back on Trelegy now that she has assistance. Feels this is helpful  12/20/22 Since our last visit she had covid four weeks ago and has overall improved but still having nasal congestion. Using nasal sprays which help some. Compliant with Trelegy daily. Has not needed to use rescue inhaler. Denies cough or wheezing. Does not usually have breathing issues except for a single episode three days ago where she felt like she couldn't catch her breath. Denies syncope or dizziness. No further issues since then. Has been laying these last few weeks with minimal activity.   07/30/23 She reports shortness of breath that is unchanged. Denies wheezing or coughing. Can happen with sitting while crocheting. No longer taking Trelegy due to self-discontinuation. On albuterol four times a week  with once daily use. She denies exacerbations except when she was hospitalized in Sept 2024 for pneumonia. Believes Trelegy contributed to her illness.  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   Arthritis    "shoulders; wrist; probably spine" (06/24/2013)   Blood transfusion without reported diagnosis    Cancer (HCC)    skin cancer right leg x 4     Cataract    removed both eyes    Chronic low back pain    followed by Dr Vear Clock pain mgt   Colon polyp    Constipation    COPD (chronic obstructive pulmonary disease) (HCC)    mild   Esophageal stricture    Finger pain, left    2 fingers on left hand since wrist surgery   GERD (gastroesophageal reflux disease)    Glaucoma    Heart murmur    "slight; not on RX" (06/24/2013)   History of cardiac arrhythmia    cardiologist- Traci Turner   Hx of colonic polyps 08/28/2004   Hyperlipemia    Hypertension    Hypothyroidism    Osteoarthritis of left knee    advanced   Osteoporosis ?   PONV (postoperative nausea and vomiting)    Pt reports symptoms are the result of gallbladder and cholecystectomy, not anesthesia   PVC's (premature ventricular contractions)    Sleep apnea 1990's   "tested; tried mask; couldn't stand it; told me as long as I slept on my side I'd  be ok" (06/24/2013)   Thyroid nodule    Tobacco abuse      Family History  Problem Relation Age of Onset   Pancreatic cancer Father    Cancer Father    Diabetes type II Mother    Hypertension Mother    Coronary artery disease Mother 66   Diabetes Mother    Thyroid cancer Mother    Skin cancer Mother    Diabetes Sister    Hypertension Sister    Bone cancer Sister    Breast cancer Sister    Hypertension Brother    Breast cancer Maternal Aunt    Other Neg Hx        No family history of  colon cancer   Colon cancer Neg Hx    Colon polyps Neg Hx    Esophageal  cancer Neg Hx    Stomach cancer Neg Hx    Rectal cancer Neg Hx      Social History   Occupational History   Not on file  Tobacco Use   Smoking status: Every Day    Current packs/day: 1.50    Average packs/day: 1.5 packs/day for 50.0 years (75.0 ttl pk-yrs)    Types: Cigarettes   Smokeless tobacco: Never   Tobacco comments:    10 cigarettes smoked daily ARJ 11/07/21  Vaping Use   Vaping status: Never Used  Substance and Sexual Activity   Alcohol use: No    Alcohol/week: 0.0 standard drinks of alcohol   Drug use: No   Sexual activity: Not Currently    Allergies  Allergen Reactions   Metformin And Related Nausea And Vomiting   Morphine And Codeine Swelling   5-Alpha Reductase Inhibitors    Amitriptyline Swelling    Leg swelling   Augmentin [Amoxicillin-Pot Clavulanate] Nausea And Vomiting   Gabapentin Itching   Triamterene Itching and Rash     Outpatient Medications Prior to Visit  Medication Sig Dispense Refill   Accu-Chek Softclix Lancets lancets USE 1 LANCET TO CHECK GLUCOSE THREE TIMES DAILY IN THE MORNING, AT NOON, AND AT BEDTIME. 100 each 0   Acetaminophen (TYLENOL 8 HOUR ARTHRITIS PAIN PO) Take 1 tablet by mouth daily.     albuterol (VENTOLIN HFA) 108 (90 Base) MCG/ACT inhaler Inhale 2 puffs into the lungs every 6 (six) hours as needed for wheezing or shortness of breath. 6.7 g 2   Blood Glucose Monitoring Suppl DEVI 1 each by Does not apply route in the morning, at noon, and at bedtime. May substitute to any manufacturer covered by patient's insurance. 1 each 0   carisoprodol (SOMA) 350 MG tablet Take 175 mg by mouth 3 (three) times daily.      diclofenac Sodium (VOLTAREN) 1 % GEL as needed.     dorzolamide-timolol (COSOPT) 22.3-6.8 MG/ML ophthalmic solution Place 1 drop into the left eye 2 (two) times daily.     estradiol (ESTRACE) 0.5 MG tablet Take 0.5 mg by mouth at bedtime.   4   fluticasone (FLONASE) 50 MCG/ACT nasal spray Use 2 spray(s) in each nostril once  daily (Patient taking differently: Place 1 spray into both nostrils daily as needed for allergies.) 16 g 5   furosemide (LASIX) 40 MG tablet Take 2 tablets (80 mg total) by mouth daily. 180 tablet 0   glimepiride (AMARYL) 2 MG tablet Take 1 tablet (2 mg total) by mouth daily before breakfast. 90 tablet 1   glucose blood (ACCU-CHEK GUIDE) test strip USE 1 STRIP TO CHECK GLUCOSE THREE  TIMES DAILY IN THE MORNING, AT NOON, AND AT BEDTIME 100 each 2   ipratropium (ATROVENT) 0.06 % nasal spray Place 2 sprays into both nostrils 2 (two) times daily as needed for rhinitis. 15 mL 5   levothyroxine (SYNTHROID) 100 MCG tablet Take 1 tablet (100 mcg total) by mouth daily. 90 tablet 1   NONFORMULARY OR COMPOUNDED ITEM Compression socks  20-30 mm/ hg   Dx low ext edema 1 each 1   omeprazole (PRILOSEC) 20 MG capsule Take 2 capsules by mouth once daily 180 capsule 1   oxyCODONE-acetaminophen (PERCOCET) 10-325 MG tablet Take 1 tablet by mouth every 4 (four) hours as needed for pain 160 tablet 0   potassium chloride SA (KLOR-CON M20) 20 MEQ tablet TAKE 4 TABLETS BY MOUTH ONCE DAILY 270 tablet 1   Probiotic Product (PROBIOTIC ACIDOPHILUS BEADS PO) Take 1 tablet by mouth daily.     simvastatin (ZOCOR) 40 MG tablet TAKE 1 TABLET BY MOUTH AT  BEDTIME 100 tablet 2   spironolactone (ALDACTONE) 25 MG tablet Take 1 tablet (25 mg total) by mouth 2 (two) times daily. 180 tablet 0   traZODone (DESYREL) 50 MG tablet TAKE 1 TABLET BY MOUTH AT BEDTIME 90 tablet 1   triamcinolone cream (KENALOG) 0.1 % Apply 1 Application topically.     feeding supplement (ENSURE ENLIVE / ENSURE PLUS) LIQD Take 237 mLs by mouth 2 (two) times daily between meals. 237 mL 12   oxyCODONE-acetaminophen (PERCOCET) 10-325 MG tablet Take 1 tablet by mouth every 4 (four) hours as needed for pain (fill 01/15/23) 160 tablet 0   oxyCODONE-acetaminophen (PERCOCET) 10-325 MG tablet Take 1 tablet by mouth every 4 (four) hours as needed for pain 160 tablet 0    oxyCODONE-acetaminophen (PERCOCET) 10-325 MG tablet Take 1 tablet by mouth every 4 (four) hours as needed for pain 160 tablet 0   No facility-administered medications prior to visit.    Review of Systems  Constitutional:  Positive for malaise/fatigue. Negative for chills, diaphoresis, fever and weight loss.  HENT:  Positive for congestion.   Respiratory:  Positive for shortness of breath. Negative for cough, hemoptysis, sputum production and wheezing.   Cardiovascular:  Negative for chest pain, palpitations and leg swelling.     Objective:   Vitals:   07/30/23 1421  BP: 120/80  Pulse: 87  SpO2: 97%  Weight: 164 lb (74.4 kg)  Height: 5' (1.524 m)   SpO2: 97 %  Physical Exam: General: Well-appearing, no acute distress HENT: [place], AT Eyes: EOMI, no scleral icterus Respiratory: Clear to auscultation bilaterally.  No crackles, wheezing or rales Cardiovascular: RRR, -M/R/G, no JVD Extremities:-Edema,-tenderness Neuro: AAO x4, CNII-XII grossly intact Psych: Normal mood, normal affect  Data Reviewed:  Imaging: CT Chest 11/23/21 - New nodulr RUL consolidation. Peribronchovascular nodularity progressive from 04/18/20. Emphysema  CT Chest 02/26/22 - Resolved RML nodularity. Unchanged nodularity in RUL  CT Chest 11/24/22 - Stable RLL ground glass nodule since 01/22/20. Emphysema. Unchanged nodularity and mucoid impaction - plikely post-infections  CTA 03/08/23 - No PE. Multifocal pneumonia in RML. Innumerable subcentimetere noncalcified nodules which are nonspecific  CT Chest 07/07/23 - Clustered micronodules in RUL, RML and lingula. Overall decreased compared to 03/08/23. Improved nodule 1.1 cm, decreased compared to prior imagine.  PFT: 01/28/20 FVC 1.88 (80%) FEV1 1.32 (75%) Ratio 68  DLCO 76% Interpretation: Mild obstructive defect with mildly reduced DLCO  Labs: CBC    Component Value Date/Time   WBC 5.2 05/21/2023 1301   RBC 4.30  05/21/2023 1301   HGB 12.9 05/21/2023 1301    HGB 13.2 10/29/2019 1411   HCT 39.2 05/21/2023 1301   HCT 39.7 10/29/2019 1411   PLT 247.0 05/21/2023 1301   PLT 237 10/29/2019 1411   MCV 91.3 05/21/2023 1301   MCV 90 10/29/2019 1411   MCH 30.1 03/15/2023 0435   MCHC 32.9 05/21/2023 1301   RDW 14.2 05/21/2023 1301   RDW 12.7 10/29/2019 1411   LYMPHSABS 1.2 05/21/2023 1301   MONOABS 0.7 05/21/2023 1301   EOSABS 0.1 05/21/2023 1301   BASOSABS 0.0 05/21/2023 1301   Absolute eos 11/16/21 - 100  ONO 11/29/21 Duration 2:49:28    Assessment & Plan:   Discussion: 78 year old female active smoker with COPD, pulmonary nodules, GERD, hypothyroidism, osteopenia, glaucoma, HTN, HLD, hx PVC who presents for follow-up for CT scan. Reviewed CT and demonstrated pneumonia in August 2024 and on December 2024 imaging improved infiltrate, slightly worse nodularity compared to May 2024 thought not significant. Overall asymptomatic with stable shortness of breath and currently off maintenance medications.   Emphysema/COPD, mild --CONTINUE Albuterol AS NEEDED for shortness of breath --Encourage daily walking in the house and then start daily exercise with walking when able  Abnormal CT suggestive MAC Waxing and waning pulmonary nodules, thought to be inflammatory --Reviewed CT 02/26/22 with resolved RML nodularity. Overall stable with slight increase in densities after recent pneumonia in August 2024 --ORDER CT Chest without contrast in 1 years 06/2024  Nasal congestion - persistent --CONTINUE atrovent TWO sprays per nostril in the morning. OK to use additional spray if needed --CONTINUE flonase TWO sprays per nostril at night  Health Maintenance Immunization History  Administered Date(s) Administered   Fluad Quad(high Dose 65+) 04/03/2019, 04/20/2020, 03/30/2022   Fluad Trivalent(High Dose 65+) 04/23/2023   Influenza Split 03/28/2012   Influenza Whole 04/28/2007, 05/25/2008, 04/19/2009, 04/18/2010   Influenza, High Dose Seasonal PF  04/13/2014, 04/04/2015, 04/30/2016, 03/14/2017, 04/29/2018   Influenza,inj,Quad PF,6+ Mos 03/06/2013   Influenza,inj,quad, With Preservative 04/08/2017   Influenza-Unspecified 04/08/2018, 09/30/2021   Moderna Covid-19 Vaccine Bivalent Booster 65yrs & up 11/16/2021   Moderna Sars-Covid-2 Vaccination 10/19/2019, 11/16/2019, 05/31/2020   Pfizer(Comirnaty)Fall Seasonal Vaccine 12 years and older 04/23/2023   Pneumococcal Conjugate-13 06/07/2014   Pneumococcal Polysaccharide-23 08/12/2007, 10/18/2015   Td 10/23/2004, 09/08/2014   Td (Adult), 2 Lf Tetanus Toxid, Preservative Free 10/23/2004, 09/08/2014   Zoster, Live 03/28/2012   CT Lung Screen - qualified however under surveillance imaging.  Orders Placed This Encounter  Procedures   CT Chest Wo Contrast    Standing Status:   Future    Expiration Date:   07/29/2024    Scheduling Instructions:     Schedule for 06/2023    Preferred imaging location?:   [facility]   No orders of the defined types were placed in this encounter.   Return in about 11 months (around 06/28/2024) for after CT scan.   I have spent a total time of 35-minutes on the day of the appointment including chart review, data review, collecting history, coordinating care and discussing medical diagnosis and plan with the patient/family. Past medical history, allergies, medications were reviewed. Pertinent imaging, labs and tests included in this note have been reviewed and interpreted independently by me.  Renate Danh Mechele Collin, MD [facility] 07/30/2023 4:38 PM

## 2023-07-30 NOTE — Patient Instructions (Signed)
Emphysema/COPD, mild --CONTINUE Albuterol AS NEEDED for shortness of breath --Encourage daily walking in the house and then start daily exercise with walking when able  Abnormal CT suggestive MAC Waxing and waning pulmonary nodules, thought to be inflammatory --Reviewed CT 02/26/22 with resolved RML nodularity. Overall stable with slight increase in densities after recent pneumonia in August 2024 --ORDER CT Chest without contrast in 1 years 06/2024

## 2023-08-07 DIAGNOSIS — H401121 Primary open-angle glaucoma, left eye, mild stage: Secondary | ICD-10-CM | POA: Diagnosis not present

## 2023-08-07 DIAGNOSIS — H53143 Visual discomfort, bilateral: Secondary | ICD-10-CM | POA: Diagnosis not present

## 2023-08-07 DIAGNOSIS — H524 Presbyopia: Secondary | ICD-10-CM | POA: Diagnosis not present

## 2023-08-07 DIAGNOSIS — H04123 Dry eye syndrome of bilateral lacrimal glands: Secondary | ICD-10-CM | POA: Diagnosis not present

## 2023-08-07 DIAGNOSIS — H26493 Other secondary cataract, bilateral: Secondary | ICD-10-CM | POA: Diagnosis not present

## 2023-08-08 LAB — HM DIABETES EYE EXAM

## 2023-08-13 ENCOUNTER — Other Ambulatory Visit (HOSPITAL_COMMUNITY): Payer: Self-pay

## 2023-08-13 DIAGNOSIS — G894 Chronic pain syndrome: Secondary | ICD-10-CM | POA: Diagnosis not present

## 2023-08-13 DIAGNOSIS — Z79891 Long term (current) use of opiate analgesic: Secondary | ICD-10-CM | POA: Diagnosis not present

## 2023-08-13 DIAGNOSIS — M62830 Muscle spasm of back: Secondary | ICD-10-CM | POA: Diagnosis not present

## 2023-08-13 DIAGNOSIS — M961 Postlaminectomy syndrome, not elsewhere classified: Secondary | ICD-10-CM | POA: Diagnosis not present

## 2023-08-13 MED ORDER — OXYCODONE-ACETAMINOPHEN 10-325 MG PO TABS
ORAL_TABLET | ORAL | 0 refills | Status: AC
  Filled 2023-08-13: qty 160, 27d supply, fill #0

## 2023-08-13 MED ORDER — OXYCODONE-ACETAMINOPHEN 10-325 MG PO TABS
1.0000 | ORAL_TABLET | ORAL | 0 refills | Status: DC | PRN
  Filled 2023-09-20: qty 160, 27d supply, fill #0

## 2023-08-19 ENCOUNTER — Other Ambulatory Visit: Payer: Self-pay | Admitting: Obstetrics and Gynecology

## 2023-08-19 DIAGNOSIS — Z1231 Encounter for screening mammogram for malignant neoplasm of breast: Secondary | ICD-10-CM

## 2023-09-17 ENCOUNTER — Other Ambulatory Visit: Payer: Self-pay | Admitting: Pulmonary Disease

## 2023-09-18 NOTE — Progress Notes (Unsigned)
 Cardiology Office Note:  .   Date:  09/20/2023  ID:  Joan [name], DOB [birth year], MRN [id number] PCP: Zola Button, Grayling Congress, DO  [facility] Providers Cardiologist:  Reatha Harps, MD   History of Present Illness: .    Chief Complaint  Patient presents with   Follow-up    Joan [name] is a 78 y.o. female with history of venous insufficiency, COPD, DM, HLD who presents for follow-up.    History of Present Illness   Joan [name] is a 78 year old female with COPD, venous insufficiency, HTN, HLD, tobacco abuse who presents for follow-up.  She has a history of COPD and smokes intermittently, though she has not smoked since the previous Sunday. She notes a noticeable improvement in her respiratory health when she abstains from smoking. No chest pain or trouble breathing. Last fall, she developed a severe case of pneumonia, resulting in an eight-day hospitalization where she required intravenous antibiotics. She has not fully regained her strength since the pneumonia, although she no longer experiences symptoms such as mucus production, vomiting, or coughing.  Regarding her venous insufficiency, her legs are currently wrapped, which helps manage the swelling. She experiences some swelling in her feet, making it difficult to wear shoes. She is taking Lasix 40 mg twice daily and spironolactone 25 mg twice daily, which helps control the swelling.   She was diagnosed with diabetes in August and has since cut out sweets from her diet. She takes her diabetes medication as prescribed and reports a lack of appetite since her pneumonia, which has led to some weight loss.  No significant changes in her cholesterol management. She had a recent CT scan showing no coronary calcium and is scheduled for a mammogram next week.          Problem List 1. HTN 2. Tobacco abuse -50 pack year history  3. Venous insufficiency/venous dermatitis  -negative DVT study 03/2019 -BNP 13 -EF  55-60% no WMA on my review  4.  HLD -T chol 161, HDL 52, LDL 66, TG 212 -CAC=0 (CT Chest 07/2023) 5. COPD -moderate 6. DM -A1c 6.5 7. Tobacco abuse     ROS: All other ROS reviewed and negative. Pertinent positives noted in the HPI.     Studies Reviewed: Marland Kitchen   EKG Interpretation Date/Time:  Friday September 20 2023 13:15:42 EDT Ventricular Rate:  74 PR Interval:    QRS Duration:  62 QT Interval:  338 QTC Calculation: 375 R Axis:   14  Text Interpretation: Normal sinus rhythm Low voltage QRS Nonspecific T wave abnormality Confirmed by Lennie Odor [phone]) on 09/20/2023 1:16:36 PM  TTE 12/25/2019  1. MIld inferior basal hypokinesis Prominent epicaridal fat. Left  ventricular ejection fraction, by estimation, is 50 to 55%. The left  ventricle has low normal function. The left ventricle demonstrates  regional wall motion abnormalities (see scoring  diagram/findings for description). Left ventricular diastolic parameters  are consistent with Grade I diastolic dysfunction (impaired relaxation).   2. Right ventricular systolic function is normal. The right ventricular  size is normal.   3. The mitral valve is normal in structure. No evidence of mitral valve  regurgitation. No evidence of mitral stenosis.   4. The aortic valve was not well visualized. Aortic valve regurgitation  is not visualized. Mild aortic valve sclerosis is present, with no  evidence of aortic valve stenosis.   5. The inferior vena cava is normal in size with greater than 50%  respiratory  variability, suggesting right atrial pressure of 3 mmHg.    Physical Exam:   VS:  BP 130/70   Pulse 74   Ht 5' (1.524 m)   Wt 174 lb (78.9 kg)   SpO2 97%   BMI 33.98 kg/m    Wt Readings from Last 3 Encounters:  09/20/23 174 lb (78.9 kg)  07/30/23 164 lb (74.4 kg)  03/21/23 164 lb 3.2 oz (74.5 kg)    GEN: Well nourished, well developed in no acute distress NECK: No JVD; No carotid bruits CARDIAC: RRR, no murmurs, rubs,  gallops RESPIRATORY:  Clear to auscultation without rales, wheezing or rhonchi  ABDOMEN: Soft, non-tender, non-distended EXTREMITIES: trace edema  ASSESSMENT AND PLAN: .   Assessment and Plan    Venous Insufficiency Chronic venous insufficiency with mild ankle edema controlled by Lasix and spironolactone.  - Continue Lasix 40 mg twice daily. - Continue spironolactone 25 mg twice daily. - Encourage leg elevation and wrapping.   HTN -controlled on aldactone 25 mg BID  Tobacco abuse COPD -3 min smoking cessation counseling provided in office.    Diabetes Mellitus Blood glucose levels well-controlled with current management and dietary changes. - Continue current diabetes management.  Hyperlipidemia Well-controlled with current medication. Recent LDL cholesterol is 66 mg/dL. - Continue current cholesterol medication. -CT chest reviewed 07/2023-> no coronary calcium              Follow-up: Return in about 1 year (around 09/19/2024).  Signed, Lenna Gilford. Flora Lipps, MD, [facility]  [facility]  [address] [place], [place] [address] [phone]  1:30 PM

## 2023-09-20 ENCOUNTER — Encounter: Payer: Self-pay | Admitting: Cardiovascular Disease

## 2023-09-20 ENCOUNTER — Ambulatory Visit: Payer: Medicare Other | Attending: Cardiovascular Disease | Admitting: Cardiovascular Disease

## 2023-09-20 ENCOUNTER — Other Ambulatory Visit (HOSPITAL_COMMUNITY): Payer: Self-pay

## 2023-09-20 VITALS — BP 130/70 | HR 74 | Ht 60.0 in | Wt 174.0 lb

## 2023-09-20 DIAGNOSIS — R0602 Shortness of breath: Secondary | ICD-10-CM

## 2023-09-20 DIAGNOSIS — I872 Venous insufficiency (chronic) (peripheral): Secondary | ICD-10-CM | POA: Diagnosis not present

## 2023-09-20 DIAGNOSIS — M7989 Other specified soft tissue disorders: Secondary | ICD-10-CM

## 2023-09-20 DIAGNOSIS — Z720 Tobacco use: Secondary | ICD-10-CM | POA: Diagnosis not present

## 2023-09-20 DIAGNOSIS — F17200 Nicotine dependence, unspecified, uncomplicated: Secondary | ICD-10-CM | POA: Diagnosis not present

## 2023-09-20 DIAGNOSIS — F17210 Nicotine dependence, cigarettes, uncomplicated: Secondary | ICD-10-CM | POA: Diagnosis not present

## 2023-09-20 DIAGNOSIS — I10 Essential (primary) hypertension: Secondary | ICD-10-CM

## 2023-09-20 DIAGNOSIS — E782 Mixed hyperlipidemia: Secondary | ICD-10-CM

## 2023-09-20 NOTE — Patient Instructions (Signed)
 Medication Instructions:  Your physician recommends that you continue on your current medications as directed. Please refer to the Current Medication list given to you today.    *If you need a refill on your cardiac medications before your next appointment, please call your pharmacy*   Lab Work: None    If you have labs (blood work) drawn today and your tests are completely normal, you will receive your results only by: MyChart Message (if you have MyChart) OR A paper copy in the mail If you have any lab test that is abnormal or we need to change your treatment, we will call you to review the results.   Testing/Procedures: None    Follow-Up: At [facility], you and your health needs are our priority.  As part of our continuing mission to provide you with exceptional heart care, we have created designated Provider Care Teams.  These Care Teams include your primary Cardiologist (physician) and Advanced Practice Providers (APPs -  Physician Assistants and Nurse Practitioners) who all work together to provide you with the care you need, when you need it.  We recommend signing up for the patient portal called "MyChart".  Sign up information is provided on this After Visit Summary.  MyChart is used to connect with patients for Virtual Visits (Telemedicine).  Patients are able to view lab/test results, encounter notes, upcoming appointments, etc.  Non-urgent messages can be sent to your provider as well.   To learn more about what you can do with MyChart, go to [email or web address].    Your next appointment:   1 year(s)  The format for your next appointment:   In Person  Provider:   Edd Fabian, FNP, Micah Flesher, PA-C, Marjie Skiff, PA-C, Robet Leu, PA-C, Juanda Crumble, PA-C, Joni Reining, DNP, ANP, Azalee Course, PA-C, Bernadene Person, NP, or Reather Littler, NP       Other Instructions

## 2023-09-23 ENCOUNTER — Telehealth: Payer: Self-pay | Admitting: Family Medicine

## 2023-09-23 NOTE — Telephone Encounter (Signed)
 Copied from CRM [id number]. Topic: Medicare AWV >> Sep 23, 2023  9:30 AM Joan [name] wrote: Reason for CRM: Called to scheduled AWV, called (2x) pt answers do not talk - 09/23/2023  Verlee Rossetti; Care Guide Ambulatory Clinical Support [place] [facility] Direct Dial: [phone]

## 2023-09-24 ENCOUNTER — Ambulatory Visit: Payer: Medicare Other

## 2023-09-26 ENCOUNTER — Other Ambulatory Visit: Payer: Self-pay | Admitting: Family Medicine

## 2023-09-26 DIAGNOSIS — E876 Hypokalemia: Secondary | ICD-10-CM

## 2023-09-27 ENCOUNTER — Other Ambulatory Visit: Payer: Self-pay | Admitting: Family Medicine

## 2023-09-27 DIAGNOSIS — E876 Hypokalemia: Secondary | ICD-10-CM

## 2023-09-27 NOTE — Telephone Encounter (Unsigned)
 Copied from CRM [id number]. Topic: Clinical - Medication Refill >> Sep 27, 2023  8:56 AM Lennart Pall wrote: Most Recent Primary Care Visit:  Provider: [facility]  Department: [facility]  Visit Type: LAB  Date: 05/21/2023  Medication: potassium chloride SA (KLOR-CON M20) 20 MEQ tablet  Has the patient contacted their pharmacy? Yes (Agent: If no, request that the patient contact the pharmacy for the refill. If patient does not wish to contact the pharmacy document the reason why and proceed with request.) (Agent: If yes, when and what did the pharmacy advise?)  Is this the correct pharmacy for this prescription? Yes If no, delete pharmacy and type the correct one.  This is the patient's preferred pharmacy:  [facility] [address] [place] - [address] [place] [address] [place] [address] Phone: [phone] Fax: [phone]   Has the prescription been filled recently? Yes  Is the patient out of the medication? Yes  Has the patient been seen for an appointment in the last year OR does the patient have an upcoming appointment? Yes  Can we respond through MyChart? Yes  Agent: Please be advised that Rx refills may take up to 3 business days. We ask that you follow-up with your pharmacy.

## 2023-09-30 ENCOUNTER — Other Ambulatory Visit: Payer: Self-pay

## 2023-09-30 DIAGNOSIS — E876 Hypokalemia: Secondary | ICD-10-CM

## 2023-09-30 MED ORDER — POTASSIUM CHLORIDE CRYS ER 20 MEQ PO TBCR: EXTENDED_RELEASE_TABLET | ORAL | 1 refills | Status: DC

## 2023-09-30 MED ORDER — POTASSIUM CHLORIDE CRYS ER 20 MEQ PO TBCR: 20.0000 meq | EXTENDED_RELEASE_TABLET | Freq: Every day | ORAL | 1 refills | Status: DC

## 2023-09-30 NOTE — Telephone Encounter (Signed)
 Attempted to call [facility], they placed me on old several times. New rx sent with correct sig.

## 2023-09-30 NOTE — Telephone Encounter (Signed)
 Copied from CRM [id number]. Topic: Clinical - Medication Question >> Sep 30, 2023 10:36 AM Joan [name] wrote: Patient called in to let Joan [name] know that Joan [name] is requesting more information concerning her potassium medication and how often she is to take them. They are needing to know the number of pills the patient is to take daily.   Joan [name]:  # [id number]  Patient CB#  [phone]

## 2023-10-08 DIAGNOSIS — M62830 Muscle spasm of back: Secondary | ICD-10-CM | POA: Diagnosis not present

## 2023-10-08 DIAGNOSIS — M961 Postlaminectomy syndrome, not elsewhere classified: Secondary | ICD-10-CM | POA: Diagnosis not present

## 2023-10-08 DIAGNOSIS — Z79891 Long term (current) use of opiate analgesic: Secondary | ICD-10-CM | POA: Diagnosis not present

## 2023-10-08 DIAGNOSIS — G894 Chronic pain syndrome: Secondary | ICD-10-CM | POA: Diagnosis not present

## 2023-10-22 ENCOUNTER — Other Ambulatory Visit (HOSPITAL_COMMUNITY): Payer: Self-pay

## 2023-10-22 DIAGNOSIS — B078 Other viral warts: Secondary | ICD-10-CM | POA: Diagnosis not present

## 2023-10-22 DIAGNOSIS — I872 Venous insufficiency (chronic) (peripheral): Secondary | ICD-10-CM | POA: Diagnosis not present

## 2023-10-22 DIAGNOSIS — L308 Other specified dermatitis: Secondary | ICD-10-CM | POA: Diagnosis not present

## 2023-10-22 DIAGNOSIS — D485 Neoplasm of uncertain behavior of skin: Secondary | ICD-10-CM | POA: Diagnosis not present

## 2023-10-22 MED ORDER — OXYCODONE-ACETAMINOPHEN 10-325 MG PO TABS
1.0000 | ORAL_TABLET | ORAL | 0 refills | Status: DC | PRN
  Filled 2023-10-22: qty 160, 27d supply, fill #0

## 2023-10-29 ENCOUNTER — Ambulatory Visit
Admission: RE | Admit: 2023-10-29 | Discharge: 2023-10-29 | Disposition: A | Discharge status: Home / Self Care | Source: Ambulatory Visit | Attending: Obstetrics and Gynecology | Admitting: Obstetrics and Gynecology

## 2023-10-29 DIAGNOSIS — Z1231 Encounter for screening mammogram for malignant neoplasm of breast: Secondary | ICD-10-CM

## 2023-10-31 ENCOUNTER — Other Ambulatory Visit: Payer: Self-pay | Admitting: Family Medicine

## 2023-10-31 DIAGNOSIS — R600 Localized edema: Secondary | ICD-10-CM

## 2023-11-08 ENCOUNTER — Other Ambulatory Visit: Payer: Self-pay | Admitting: Family Medicine

## 2023-11-08 DIAGNOSIS — K219 Gastro-esophageal reflux disease without esophagitis: Secondary | ICD-10-CM

## 2023-11-20 ENCOUNTER — Other Ambulatory Visit: Payer: Self-pay | Admitting: Family Medicine

## 2023-11-26 ENCOUNTER — Other Ambulatory Visit (HOSPITAL_COMMUNITY): Payer: Self-pay

## 2023-11-26 MED ORDER — OXYCODONE-ACETAMINOPHEN 10-325 MG PO TABS
1.0000 | ORAL_TABLET | ORAL | 0 refills | Status: AC | PRN
  Filled 2023-11-26: qty 160, 27d supply, fill #0

## 2023-11-29 ENCOUNTER — Other Ambulatory Visit: Payer: Self-pay | Admitting: Family Medicine

## 2023-11-29 DIAGNOSIS — R600 Localized edema: Secondary | ICD-10-CM

## 2023-12-05 ENCOUNTER — Other Ambulatory Visit (HOSPITAL_COMMUNITY): Payer: Self-pay

## 2023-12-05 DIAGNOSIS — M961 Postlaminectomy syndrome, not elsewhere classified: Secondary | ICD-10-CM | POA: Diagnosis not present

## 2023-12-05 DIAGNOSIS — M62830 Muscle spasm of back: Secondary | ICD-10-CM | POA: Diagnosis not present

## 2023-12-05 DIAGNOSIS — Z79891 Long term (current) use of opiate analgesic: Secondary | ICD-10-CM | POA: Diagnosis not present

## 2023-12-05 DIAGNOSIS — G894 Chronic pain syndrome: Secondary | ICD-10-CM | POA: Diagnosis not present

## 2023-12-05 MED ORDER — OXYCODONE-ACETAMINOPHEN 10-325 MG PO TABS
1.0000 | ORAL_TABLET | ORAL | 0 refills | Status: AC | PRN
  Filled 2024-03-10: qty 160, 27d supply, fill #0

## 2023-12-05 MED ORDER — OXYCODONE-ACETAMINOPHEN 10-325 MG PO TABS
1.0000 | ORAL_TABLET | ORAL | 0 refills | Status: AC | PRN
  Filled 2023-12-31: qty 160, 27d supply, fill #0

## 2023-12-06 ENCOUNTER — Other Ambulatory Visit: Payer: Self-pay | Admitting: Family Medicine

## 2023-12-15 ENCOUNTER — Other Ambulatory Visit: Payer: Self-pay | Admitting: Family Medicine

## 2023-12-15 DIAGNOSIS — G4700 Insomnia, unspecified: Secondary | ICD-10-CM

## 2023-12-16 ENCOUNTER — Other Ambulatory Visit: Payer: Self-pay | Admitting: Family Medicine

## 2023-12-16 DIAGNOSIS — G4700 Insomnia, unspecified: Secondary | ICD-10-CM

## 2023-12-18 ENCOUNTER — Other Ambulatory Visit: Payer: Self-pay | Admitting: Family Medicine

## 2023-12-18 DIAGNOSIS — E1165 Type 2 diabetes mellitus with hyperglycemia: Secondary | ICD-10-CM

## 2023-12-22 ENCOUNTER — Other Ambulatory Visit: Payer: Self-pay | Admitting: Family Medicine

## 2023-12-31 ENCOUNTER — Other Ambulatory Visit (HOSPITAL_COMMUNITY): Payer: Self-pay

## 2024-01-01 ENCOUNTER — Other Ambulatory Visit: Payer: Self-pay | Admitting: Family Medicine

## 2024-01-01 DIAGNOSIS — R600 Localized edema: Secondary | ICD-10-CM

## 2024-01-11 ENCOUNTER — Other Ambulatory Visit: Payer: Self-pay | Admitting: Family Medicine

## 2024-01-20 ENCOUNTER — Other Ambulatory Visit: Payer: Self-pay | Admitting: Family Medicine

## 2024-01-20 DIAGNOSIS — J439 Emphysema, unspecified: Secondary | ICD-10-CM

## 2024-01-21 ENCOUNTER — Encounter: Payer: Self-pay | Admitting: Family Medicine

## 2024-01-21 DIAGNOSIS — G4700 Insomnia, unspecified: Secondary | ICD-10-CM

## 2024-01-21 DIAGNOSIS — J439 Emphysema, unspecified: Secondary | ICD-10-CM

## 2024-01-22 MED ORDER — LEVOTHYROXINE SODIUM 100 MCG PO TABS: 100.0000 ug | ORAL_TABLET | Freq: Every day | ORAL | 0 refills | Status: DC

## 2024-01-22 MED ORDER — ALBUTEROL SULFATE HFA 108 (90 BASE) MCG/ACT IN AERS: 2.0000 | INHALATION_SPRAY | Freq: Four times a day (QID) | RESPIRATORY_TRACT | 0 refills | Status: AC | PRN

## 2024-01-22 MED ORDER — TRAZODONE HCL 50 MG PO TABS: 50.0000 mg | ORAL_TABLET | Freq: Every day | ORAL | 0 refills | Status: DC

## 2024-01-22 NOTE — Telephone Encounter (Signed)
 Rxs sent

## 2024-01-22 NOTE — Telephone Encounter (Signed)
 Copied from CRM [id number]. Topic: Clinical - Medication Question >> Jan 22, 2024  8:16 AM Thersia BROCKS wrote: Reason for CRM: Patient called in scheduled an appointment with Dr. Antonio Meth for 07/29 would like for her medication to be refilled  Levothyroxin, Trazodone , and Albuterol  until she is able to see her as she only has one left

## 2024-01-30 ENCOUNTER — Other Ambulatory Visit (HOSPITAL_COMMUNITY): Payer: Self-pay

## 2024-01-30 DIAGNOSIS — Z79891 Long term (current) use of opiate analgesic: Secondary | ICD-10-CM | POA: Diagnosis not present

## 2024-01-30 DIAGNOSIS — M961 Postlaminectomy syndrome, not elsewhere classified: Secondary | ICD-10-CM | POA: Diagnosis not present

## 2024-01-30 DIAGNOSIS — G894 Chronic pain syndrome: Secondary | ICD-10-CM | POA: Diagnosis not present

## 2024-01-30 DIAGNOSIS — M62830 Muscle spasm of back: Secondary | ICD-10-CM | POA: Diagnosis not present

## 2024-01-30 MED ORDER — OXYCODONE-ACETAMINOPHEN 10-325 MG PO TABS
1.0000 | ORAL_TABLET | ORAL | 0 refills | Status: AC | PRN
  Filled 2024-04-15: qty 160, 27d supply, fill #0

## 2024-01-30 MED ORDER — OXYCODONE-ACETAMINOPHEN 10-325 MG PO TABS
1.0000 | ORAL_TABLET | ORAL | 0 refills | Status: AC | PRN
  Filled 2024-01-30: qty 160, 27d supply, fill #0

## 2024-02-04 ENCOUNTER — Encounter: Payer: Self-pay | Admitting: Family Medicine

## 2024-02-04 ENCOUNTER — Ambulatory Visit (INDEPENDENT_AMBULATORY_CARE_PROVIDER_SITE_OTHER): Admitting: Family Medicine

## 2024-02-04 VITALS — BP 140/70 | HR 71 | Temp 97.8°F | Ht 60.0 in | Wt 159.4 lb

## 2024-02-04 DIAGNOSIS — I10 Essential (primary) hypertension: Secondary | ICD-10-CM

## 2024-02-04 DIAGNOSIS — Z7984 Long term (current) use of oral hypoglycemic drugs: Secondary | ICD-10-CM | POA: Diagnosis not present

## 2024-02-04 DIAGNOSIS — E785 Hyperlipidemia, unspecified: Secondary | ICD-10-CM

## 2024-02-04 DIAGNOSIS — E876 Hypokalemia: Secondary | ICD-10-CM | POA: Diagnosis not present

## 2024-02-04 DIAGNOSIS — I890 Lymphedema, not elsewhere classified: Secondary | ICD-10-CM

## 2024-02-04 DIAGNOSIS — E1165 Type 2 diabetes mellitus with hyperglycemia: Secondary | ICD-10-CM | POA: Diagnosis not present

## 2024-02-04 DIAGNOSIS — L03119 Cellulitis of unspecified part of limb: Secondary | ICD-10-CM

## 2024-02-04 DIAGNOSIS — R600 Localized edema: Secondary | ICD-10-CM

## 2024-02-04 DIAGNOSIS — G4700 Insomnia, unspecified: Secondary | ICD-10-CM

## 2024-02-04 DIAGNOSIS — E039 Hypothyroidism, unspecified: Secondary | ICD-10-CM | POA: Diagnosis not present

## 2024-02-04 LAB — MICROALBUMIN / CREATININE URINE RATIO
Creatinine,U: 62 mg/dL
Microalb Creat Ratio: UNDETERMINED mg/g (ref 0.0–30.0)
Microalb, Ur: <0.7 mg/dL

## 2024-02-04 MED ORDER — TRAZODONE HCL 50 MG PO TABS
ORAL_TABLET | ORAL | 1 refills | Status: DC
Start: 2024-02-04 — End: 2024-02-07

## 2024-02-04 MED ORDER — POTASSIUM CHLORIDE CRYS ER 20 MEQ PO TBCR: EXTENDED_RELEASE_TABLET | ORAL | 1 refills | Status: DC

## 2024-02-04 MED ORDER — GLIMEPIRIDE 2 MG PO TABS: 2.0000 mg | ORAL_TABLET | Freq: Every day | ORAL | 1 refills | Status: DC

## 2024-02-04 NOTE — Progress Notes (Signed)
 )  Subjective:    Patient ID: Joan [name], female    DOB: 08-11-1945, 78 y.o.   MRN: [id number]  Chief Complaint  Patient presents with   Hyperlipidemia    Follow up; need another form for PT for legs; still red, bumpy and swelling; may need anibiotics due to some leakage   Insomnia    Have a hard time sleeping; would be up for about 3 days and then able to fall asleep...would sleep good for a couple of days and then unable to again    HPI Patient is in today for f/u   Discussed the use of AI scribe software for clinical note transcription with the patient, who gave verbal consent to proceed.  History of Present Illness Joan [name] is a 78 year old female with insomnia and lymphedema who presents with sleep disturbances and worsening leg swelling.  She experiences significant sleep disturbances, characterized by going two to three days without sleep, followed by intermittent sleep throughout the day and night. She denies nocturia or caffeine intake at night. She takes hydrocodone  and oxycodone  at night for pain, which she suspects may contribute to her insomnia. Trazodone , prescribed for sleep, is not effective.  She has a history of lymphedema with worsening leg swelling. Both legs are wrapped. She notes blisters that have burst, with yellow discharge on the dressings, raising concerns about infection. She recalls a previous episode requiring antibiotics for similar symptoms. She applies triamcinolone  cream to her legs, as prescribed by a dermatologist. Her legs are sore, and she has had to purchase larger shoes to accommodate the swelling.  She regularly monitors her blood sugar levels, which are mostly under 100 mg/dL, with occasional readings as low as 55 mg/dL. She has cut out sugar from her diet, attributing this to her stable blood sugar levels. She takes glimepiride  for diabetes management. She mentions a family history of diabetes, with her mother having had a severe case  requiring insulin . No symptoms of jitteriness or lightheadedness when her blood sugar is low.    Past Medical History:  Diagnosis Date   Allergy ?   Arthritis    shoulders; wrist; probably spine (06/24/2013)   Blood transfusion without reported diagnosis    Cancer (HCC)    skin cancer right leg x 4     Cataract    removed both eyes    Chronic low back pain    followed by Dr Orlando pain mgt   Colon polyp    Constipation    COPD (chronic obstructive pulmonary disease) (HCC)    mild   Esophageal stricture    Finger pain, left    2 fingers on left hand since wrist surgery   GERD (gastroesophageal reflux disease)    Glaucoma    Heart murmur    slight; not on RX (06/24/2013)   History of cardiac arrhythmia    cardiologist- Traci Turner   Hx of colonic polyps 08/28/2004   Hyperlipemia    Hypertension    Hypothyroidism    Osteoarthritis of left knee    advanced   Osteoporosis ?   PONV (postoperative nausea and vomiting)    Pt reports symptoms are the result of gallbladder and cholecystectomy, not anesthesia   PVC's (premature ventricular contractions)    Sleep apnea 1990's   tested; tried mask; couldn't stand it; told me as long as I slept on my side I'd be ok (06/24/2013)   Thyroid  nodule    Tobacco abuse  Past Surgical History:  Procedure Laterality Date   ABDOMINAL HYSTERECTOMY  1988   partial (06/24/2013)   ABDOMINAL HYSTERECTOMY     partial in 1988   ANKLE SURGERY Left 1995   tendon repair (06/24/2013)   BACK SURGERY     think today was my 8th back OR (06/24/2013)   BREAST BIOPSY Right    CERVICAL SPINE SURGERY  2012   CESAREAN SECTION  1972   CHOLECYSTECTOMY N/A 04/16/2013   Procedure: LAPAROSCOPIC CHOLECYSTECTOMY ;  Surgeon: Donnice POUR. Belinda, MD;  Location: [facility];  Service: General;  Laterality: N/A;   COLONOSCOPY     ELBOW SURGERY  1990's   EYE SURGERY  ?   FOOT SURGERY Right 2012   SPUR REMOVED   FRACTURE SURGERY  ?   INFUSION PUMP  IMPLANTATION  1990's   implantablet morphine pump; took it out w/in 11 months   JOINT REPLACEMENT  ?   KNEE ARTHROSCOPY Left 1991; ~ 1993   LAPAROSCOPIC LYSIS OF ADHESIONS N/A 04/16/2013   Procedure: LAPAROSCOPIC LYSIS OF ADHESIONS;  Surgeon: Donnice POUR. Belinda, MD;  Location: [facility];  Service: General;  Laterality: N/A;   LUMBAR FUSION     and rods   LUMBAR LAMINECTOMY/DECOMPRESSION MICRODISCECTOMY N/A 11/28/2012   Procedure: DECOMPRESSIVE LUMBAR LAMINECTOMY LEVEL 1;  Surgeon: Arley SHAUNNA Helling, MD;  Location: [facility];  Service: Neurosurgery;  Laterality: N/A;  DECOMPRESSIVE LUMBAR LAMINECTOMY LEVEL 1   ORIF DISTAL RADIUS FRACTURE Left 12/30/2013   dr shari   ORIF WRIST FRACTURE Left 12/30/2013   Procedure: OPEN REDUCTION INTERNAL FIXATION (ORIF) LEFT WRIST FRACTURE AND REPAIR AS INDICATED;  Surgeon: Prentice LELON shari, MD;  Location: [facility];  Service: Orthopedics;  Laterality: Left;   POLYPECTOMY     POSTERIOR FUSION LUMBAR SPINE  06/24/2013   ROBOTIC ASSISTED SALPINGO OOPHERECTOMY Bilateral 08/20/2014   Procedure: ROBOTIC ASSISTED SALPINGO OOPHORECTOMY;  Surgeon: Rosaline DELENA Luna, MD;  Location: [facility];  Service: Gynecology;  Laterality: Bilateral;   SHOULDER ARTHROSCOPY Left 09/2011   SPINE SURGERY  8 times   THYROIDECTOMY  2012   TOTAL HIP ARTHROPLASTY Left 04/27/2019   Procedure: TOTAL HIP ARTHROPLASTY ANTERIOR APPROACH;  Surgeon: Melodi Lerner, MD;  Location: [facility];  Service: Orthopedics;  Laterality: Left;    TOTAL HIP ARTHROPLASTY Right 09/09/2019   Procedure: TOTAL HIP ARTHROPLASTY ANTERIOR APPROACH;  Surgeon: Melodi Lerner, MD;  Location: [facility];  Service: Orthopedics;  Laterality: Right;    TOTAL KNEE ARTHROPLASTY Right 09/09/2017   Procedure: RIGHT TOTAL KNEE ARTHROPLASTY;  Surgeon: Melodi Lerner, MD;  Location: [facility];  Service: Orthopedics;  Laterality: Right;   TUBAL LIGATION  ?   UPPER GASTROINTESTINAL ENDOSCOPY     VITRECTOMY Right 10/18/2020    Family  History  Problem Relation Age of Onset   Diabetes type II Mother    Hypertension Mother    Coronary artery disease Mother 69   Diabetes Mother    Thyroid  cancer Mother    Skin cancer Mother    Pancreatic cancer Father    Cancer Father    Diabetes Sister    Hypertension Sister    Bone cancer Sister    Breast cancer Sister 30 - 47   Breast cancer Paternal Aunt 57 - 74   Hypertension Brother    Other Neg Hx        No family history of  colon cancer   Colon cancer Neg Hx    Colon polyps Neg Hx  Esophageal cancer Neg Hx    Stomach cancer Neg Hx    Rectal cancer Neg Hx     Social History   Socioeconomic History   Marital status: Divorced    Spouse name: Not on file   Number of children: Not on file   Years of education: Not on file   Highest education level: 12th grade  Occupational History   Not on file  Tobacco Use   Smoking status: Every Day    Current packs/day: 1.50    Average packs/day: 1.5 packs/day for 50.0 years (75.0 ttl pk-yrs)    Types: Cigarettes   Smokeless tobacco: Never   Tobacco comments:    10 cigarettes smoked daily [facility]J 11/07/21  Vaping Use   Vaping status: Never Used  Substance and Sexual Activity   Alcohol use: No    Alcohol/week: 0.0 standard drinks of alcohol   Drug use: No   Sexual activity: Not Currently  Other Topics Concern   Not on file  Social History Narrative   Divorced   Current Smoker  1 ppd -  20 yrs      Alcohol use-no       [employer] - laid off       Physician roster:   Dr. Oneil Philips - pain management   Dr. Sarrah - GYN   Dr. Onetha - Neurosurgery   Dr. Joshua - dermatology   Dr. Shari - orthopedics   Dr. Wilbert Bihari - cardiology   Dr. Miller-ophthalmology   Social Drivers of Health   Financial Resource Strain: Low Risk  (01/28/2024)   Overall Financial Resource Strain (CARDIA)    Difficulty of Paying Living Expenses: Not very hard  Food Insecurity: No Food Insecurity (01/28/2024)   Hunger Vital  Sign    Worried About Running Out of Food in the Last Year: Never true    Ran Out of Food in the Last Year: Never true  Transportation Needs: No Transportation Needs (01/28/2024)   PRAPARE - Administrator, Civil Service (Medical): No    Lack of Transportation (Non-Medical): No  Physical Activity: Inactive (01/28/2024)   Exercise Vital Sign    Days of Exercise per Week: 0 days    Minutes of Exercise per Session: Not on file  Stress: Stress Concern Present (01/28/2024)   [employer] of Occupational Health - Occupational Stress Questionnaire    Feeling of Stress: To some extent  Social Connections: Socially Isolated (01/28/2024)   Social Connection and Isolation Panel    Frequency of Communication with Friends and Family: Once a week    Frequency of Social Gatherings with Friends and Family: Once a week    Attends Religious Services: Never    Database administrator or Organizations: No    Attends Engineer, structural: Not on file    Marital Status: Divorced  Intimate Partner Violence: Not At Risk (03/08/2023)   Humiliation, Afraid, Rape, and Kick questionnaire    Fear of Current or Ex-Partner: No    Emotionally Abused: No    Physically Abused: No    Sexually Abused: No    Outpatient Medications Prior to Visit  Medication Sig Dispense Refill   Accu-Chek Softclix Lancets lancets Check blood sugars 3 times daily 300 each 12   Acetaminophen  (TYLENOL  8 HOUR ARTHRITIS PAIN PO) Take 1 tablet by mouth daily.     albuterol  (VENTOLIN  HFA) 108 (90 Base) MCG/ACT inhaler Inhale 2 puffs into the lungs every 6 (six) hours  as needed for wheezing or shortness of breath. 18 g 0   Blood Glucose Monitoring Suppl DEVI 1 each by Does not apply route in the morning, at noon, and at bedtime. May substitute to any manufacturer covered by patient's insurance. 1 each 0   carisoprodol  (SOMA ) 350 MG tablet Take 175 mg by mouth 3 (three) times daily.      diclofenac  Sodium (VOLTAREN ) 1 %  GEL as needed.     dorzolamide -timolol  (COSOPT ) 22.3-6.8 MG/ML ophthalmic solution Place 1 drop into the left eye 2 (two) times daily.     estradiol  (ESTRACE ) 0.5 MG tablet Take 0.5 mg by mouth at bedtime.   4   fluticasone  (FLONASE ) 50 MCG/ACT nasal spray Use 2 spray(s) in each nostril once daily (Patient taking differently: Place 1 spray into both nostrils daily as needed for allergies.) 16 g 5   furosemide  (LASIX ) 40 MG tablet Take 2 tablets by mouth once daily 60 tablet 2   glucose blood (ACCU-CHEK GUIDE) test strip USE 1 STRIP TO CHECK GLUCOSE THREE TIMES DAILY IN THE MORNING, AT NOON, AND AT BEDTIME 100 each 2   ipratropium (ATROVENT ) 0.06 % nasal spray Place 2 sprays into both nostrils 2 (two) times daily as needed for rhinitis. 15 mL 5   levothyroxine  (SYNTHROID ) 100 MCG tablet Take 1 tablet (100 mcg total) by mouth daily before breakfast. 30 tablet 0   NONFORMULARY OR COMPOUNDED ITEM Compression socks  20-30 mm/ hg   Dx low ext edema 1 each 1   omeprazole  (PRILOSEC) 20 MG capsule Take 2 capsules by mouth once daily 180 capsule 1   oxyCODONE -acetaminophen  (PERCOCET) 10-325 MG tablet Take 1 tablet by mouth every 4 (four) hours as needed for pain. 160 tablet 0   oxyCODONE -acetaminophen  (PERCOCET) 10-325 MG tablet Take 1 tablet by mouth every 4 (four) hours as needed for pain. 160 tablet 0   [START ON 02/28/2024] oxyCODONE -acetaminophen  (PERCOCET) 10-325 MG tablet Take 1 tablet by mouth every 4 (four) hours as needed for pain. 160 tablet 0   Probiotic Product (PROBIOTIC ACIDOPHILUS BEADS PO) Take 1 tablet by mouth daily.     simvastatin  (ZOCOR ) 40 MG tablet TAKE 1 TABLET BY MOUTH AT  BEDTIME 100 tablet 2   spironolactone  (ALDACTONE ) 25 MG tablet Take 1 tablet by mouth twice daily 60 tablet 2   triamcinolone  cream (KENALOG ) 0.1 % Apply 1 Application topically.     glimepiride  (AMARYL ) 2 MG tablet Take 1 tablet (2 mg total) by mouth daily before breakfast. Needs appt 30 tablet 0   potassium  chloride SA (KLOR-CON  M20) 20 MEQ tablet TAKE 4 TABLETS BY MOUTH ONCE DAILY 270 tablet 1   traZODone  (DESYREL ) 50 MG tablet Take 1 tablet (50 mg total) by mouth at bedtime. 30 tablet 0   oxyCODONE -acetaminophen  (PERCOCET) 10-325 MG tablet Take 1 tablet by mouth every 4 (four) hours as needed for pain 160 tablet 0   oxyCODONE -acetaminophen  (PERCOCET) 10-325 MG tablet Take 1 tablet by mouth every four hours as needed for pain 160 tablet 0   oxyCODONE -acetaminophen  (PERCOCET) 10-325 MG tablet Take 1 tablet by mouth every 4 (four) hours as needed for pain. 160 tablet 0   oxyCODONE -acetaminophen  (PERCOCET) 10-325 MG tablet Take 1 tablet by mouth every 4 (four) hours as needed for pain. 160 tablet 0   No facility-administered medications prior to visit.    Allergies  Allergen Reactions   Metformin  And Related Nausea And Vomiting   Morphine And Codeine Swelling   5-Alpha  Reductase Inhibitors    Amitriptyline Swelling    Leg swelling   Augmentin  [Amoxicillin -Pot Clavulanate] Nausea And Vomiting   Gabapentin Itching   Triamterene Itching and Rash    Review of Systems  Constitutional:  Negative for fever and malaise/fatigue.  HENT:  Negative for congestion.   Eyes:  Negative for blurred vision.  Respiratory:  Negative for shortness of breath.   Cardiovascular:  Negative for chest pain, palpitations and leg swelling.  Gastrointestinal:  Negative for abdominal pain, blood in stool and nausea.  Genitourinary:  Negative for dysuria and frequency.  Musculoskeletal:  Negative for falls.  Skin:  Positive for rash.  Neurological:  Negative for dizziness, loss of consciousness and headaches.  Endo/Heme/Allergies:  Negative for environmental allergies.  Psychiatric/Behavioral:  Negative for depression. The patient is not nervous/anxious.        Objective:    Physical Exam Vitals and nursing note reviewed.  Constitutional:      General: She is not in acute distress.    Appearance: Normal  appearance. She is well-developed.  HENT:     Head: Normocephalic and atraumatic.  Eyes:     General: No scleral icterus.       Right eye: No discharge.        Left eye: No discharge.  Cardiovascular:     Rate and Rhythm: Normal rate and regular rhythm.     Heart sounds: No murmur heard. Pulmonary:     Effort: Pulmonary effort is normal. No respiratory distress.     Breath sounds: Normal breath sounds.  Musculoskeletal:        General: Swelling present. Normal range of motion.     Cervical back: Normal range of motion and neck supple.     Right lower leg: Edema present.     Left lower leg: Edema present.  Skin:    General: Skin is warm and dry.     Findings: Erythema and lesion present.  Neurological:     General: No focal deficit present.     Mental Status: She is alert and oriented to person, place, and time.  Psychiatric:        Mood and Affect: Mood normal.        Behavior: Behavior normal.        Thought Content: Thought content normal.        Judgment: Judgment normal.     BP (!) 140/70   Pulse 71   Temp 97.8 F (36.6 C)   Ht 5' (1.524 m)   Wt 159 lb 6.4 oz (72.3 kg)   SpO2 97%   BMI 31.13 kg/m  Wt Readings from Last 3 Encounters:  02/04/24 159 lb 6.4 oz (72.3 kg)  09/20/23 174 lb (78.9 kg)  07/30/23 164 lb (74.4 kg)    Diabetic Foot Exam - Simple   No data filed    Lab Results  Component Value Date   WBC 5.2 05/21/2023   HGB 12.9 05/21/2023   HCT 39.2 05/21/2023   PLT 247.0 05/21/2023   GLUCOSE 181 (H) 05/21/2023   CHOL 161 05/21/2023   TRIG 212.0 (H) 05/21/2023   HDL 52.40 05/21/2023   LDLDIRECT 96.0 02/18/2023   LDLCALC 66 05/21/2023   ALT 18 05/21/2023   AST 18 05/21/2023   NA 136 05/21/2023   K 4.5 05/21/2023   CL 101 05/21/2023   CREATININE 0.92 05/21/2023   BUN 15 05/21/2023   CO2 27 05/21/2023   TSH 11.34 (H) 05/21/2023   INR 1.1  03/09/2023   HGBA1C 6.5 05/21/2023    Lab Results  Component Value Date   TSH 11.34 (H)  05/21/2023   Lab Results  Component Value Date   WBC 5.2 05/21/2023   HGB 12.9 05/21/2023   HCT 39.2 05/21/2023   MCV 91.3 05/21/2023   PLT 247.0 05/21/2023   Lab Results  Component Value Date   NA 136 05/21/2023   K 4.5 05/21/2023   CO2 27 05/21/2023   GLUCOSE 181 (H) 05/21/2023   BUN 15 05/21/2023   CREATININE 0.92 05/21/2023   BILITOT 0.4 05/21/2023   ALKPHOS 93 05/21/2023   AST 18 05/21/2023   ALT 18 05/21/2023   PROT 6.5 05/21/2023   ALBUMIN  4.1 05/21/2023   CALCIUM  8.8 05/21/2023   ANIONGAP 10 03/15/2023   EGFR 62 01/03/2022   GFR 59.63 (L) 05/21/2023   Lab Results  Component Value Date   CHOL 161 05/21/2023   Lab Results  Component Value Date   HDL 52.40 05/21/2023   Lab Results  Component Value Date   LDLCALC 66 05/21/2023   Lab Results  Component Value Date   TRIG 212.0 (H) 05/21/2023   Lab Results  Component Value Date   CHOLHDL 3 05/21/2023   Lab Results  Component Value Date   HGBA1C 6.5 05/21/2023       Assessment & Plan:  Lymphedema -     Ambulatory referral to Physical Therapy  Hypokalemia -     Potassium Chloride  [facility]; TAKE 4 TABLETS BY MOUTH ONCE DAILY  Dispense: 270 tablet; Refill: 1 -     Comprehensive metabolic panel with GFR  Type 2 diabetes mellitus with hyperglycemia, without long-term current use of insulin  (HCC) Assessment & Plan: hgba1c per endo, minimize simple carbs. Increase exercise as tolerated. Continue current meds   Orders: -     Glimepiride ; Take 1 tablet (2 mg total) by mouth daily before breakfast. Needs appt  Dispense: 90 tablet; Refill: 1 -     Lipid panel -     CBC with Differential/Platelet -     Comprehensive metabolic panel with GFR -     Hemoglobin A1c -     Microalbumin / creatinine urine ratio -     TSH  Insomnia, unspecified type -     traZODone  HCl; 2 po at bedtime as needed  Dispense: 60 tablet; Refill: 1  Hypothyroidism, unspecified type  Hyperlipidemia, unspecified hyperlipidemia  type -     Lipid panel -     Comprehensive metabolic panel with GFR  Primary hypertension Assessment & Plan: Well controlled, no changes to meds. Encouraged heart healthy diet such as the DASH diet and exercise as tolerated.    Orders: -     Comprehensive metabolic panel with GFR  Cellulitis of lower extremity, unspecified laterality  Lower extremity edema Assessment & Plan: Lymphedema---  refer to pt    Assessment and Plan Assessment & Plan Lymphedema of lower extremities with chronic lower extremity ulcers and drainage   Chronic lymphedema in the lower extremities presents with significant swelling and ulceration. Both legs are wrapped, yet notable swelling and drainage persist. The ulcers have burst, producing yellow drainage, raising concern for infection. Previous treatment was delayed due to hospitalization for pneumonia. She is currently using triamcinolone  cream as prescribed by a dermatologist. Submit a referral to [facility] in [place] for lymphedema management. Evaluate the need for antibiotics after examining the legs for infection signs. Continue using triamcinolone  cream as directed. Perform blood  work and urine test to check for protein.  Type 2 diabetes mellitus   Type 2 diabetes mellitus is generally well-controlled. Morning blood glucose readings are mostly under 100 mg/dL, with occasional readings as low as 55 mg/dL. She has cut out sugar from her diet, contributing to better control. She is currently on glimepiride . Continue monitoring blood glucose levels regularly and maintain current dietary modifications. Continue glimepiride  as prescribed. Instruct to consume a small candy bar if blood sugar drops below 70 mg/dL and she becomes symptomatic.  Insomnia   Chronic insomnia is characterized by difficulty sleeping at night, with reports of not sleeping at all some nights and sleeping intermittently on others. Trazodone  has not been effective. Hydrocodone   and oxycodone  taken at night for pain may be contributing to insomnia. Attempt to discontinue hydrocodone  and oxycodone  at night to assess their impact on sleep. Increase trazodone  to two tablets at night as needed, but avoid doing so every night.    Adelard Sanon R Lowne Chase, DO

## 2024-02-04 NOTE — Patient Instructions (Signed)
 Lymphedema  Lymphedema is swelling that happens when an abnormal amount of lymph collects in the soft tissues under your skin. Lymph is fluid that moves through your lymphatic system. This system: Is part of your body's defense system, also called your immune system. Filters germs and waste from tissues in your body to your bloodstream. Lymphedema happens when your lymphatic system is blocked. This keeps lymph from draining as it should and leads to swelling. What are the causes? The cause of lymphedema depends on which type you have. Primary lymphedema is when you're born without lymph vessels or with lymph vessels that aren't normal. Secondary lymphedema is more common. It happens when lymph vessels are blocked or damaged from: Infection. Injury. Radiation therapy. Cancer. Scar tissue that forms. Surgery. What are the signs or symptoms? A swollen arm, leg, feet, toes, or fingers. A heavy or tight feeling in the swollen area. Skin that turns red near the swollen area. Not being able to move your arm or leg. Your arm or leg is sensitive to touch. Discomfort in your arm or leg. How is this diagnosed? Lymphedema may be diagnosed based on: Your symptoms and medical history. A physical exam. Bioimpedance spectroscopy. This test uses painless electrical currents. They help measure fluid levels in your body. Imaging tests, such as: MRI or CT scan. Duplex ultrasound. This test uses sound waves to make pictures on a screen. The pictures show your blood vessels and blood flow. Lymphoscintigraphy. In this test, a low dose of radioactive substance is given through a needle that goes through your skin. The substance traces the flow of lymph through your lymph vessels. Lymphangiography. In this test, a contrast dye is put into your lymph vessel. The dye helps show if the vessel is blocked. How is this treated?  If another condition is causing your lymphedema, that condition will be treated.  For example, antibiotics may be used to treat infection. Treatment for lymphedema depends on the cause. Treatment may include: Complete decongestive therapy (CDT). This lowers fluid buildup. CDT includes: Pressure (compression) wrapping of the area. Manual lymph drainage. This helps lymph drain out of your arm or leg. Certain exercises. These help fluid move out of your arm or leg. Compression. This puts pressure on your arm or leg to lower swelling. It includes: Compression stockings or sleeves. Special bandage wraps. Surgery. This is normally done only for severe cases that don't get better with other treatments. Follow these instructions at home: Self-care Your swollen area is more likely to get hurt or infected. To help prevent infection: Keep the area clean and dry. Use creams or lotions that your health care provider says are okay. These keep your skin moist. Protect your skin from cuts. Use gloves when you cook or garden. Do not walk barefoot. If you shave the area, use an Neurosurgeon. Do not wear tight clothes, shoes, or jewelry. Eat a healthy diet. Eat a lot of fruits and vegetables. Activity Do exercises as told by your provider. Do not sit with your legs crossed. When you can, keep the swollen leg or foot raised above the level of your heart. Avoid using an arm with lymphedema to carry things. General instructions Wear compression stockings or sleeves as told by your provider. Note any changes in size of the swollen arm or leg. You may be told to measure it at set times and track the results. Take over-the-counter and prescription medicines only as told by your provider. If you were prescribed antibiotics, use them  as told by your provider. Do not stop using the antibiotic even if you start to feel better or if your condition improves. Do not use heating pads or ice packs on the swollen area. Avoid having your swollen arm or leg used for: Blood draws. IVs. Blood  pressure checks. Contact a health care provider if: You get new swelling in your arm or leg all of a sudden. Fluid leaks from the skin of your swollen arm or leg. You have a cut that doesn't heal. The swollen area hurts or turns red. You get purple spots, a rash, blisters, or sores on your swollen arm or leg. You have a fever or chills. This information is not intended to replace advice given to you by your health care provider. Make sure you discuss any questions you have with your health care provider. Document Revised: 09/19/2022 Document Reviewed: 09/19/2022 Elsevier Patient Education  2024 [employer].

## 2024-02-04 NOTE — Assessment & Plan Note (Signed)
 Lymphedema---  refer to pt

## 2024-02-04 NOTE — Assessment & Plan Note (Signed)
[id number] per endo, minimize simple carbs. Increase exercise as tolerated. Continue current meds  

## 2024-02-04 NOTE — Assessment & Plan Note (Signed)
 Well controlled, no changes to meds. Encouraged heart healthy diet such as the DASH diet and exercise as tolerated.

## 2024-02-05 LAB — COMPREHENSIVE METABOLIC PANEL WITH GFR
ALT: 10 U/L (ref 0–35)
AST: 15 U/L (ref 0–37)
Albumin: 4.3 g/dL (ref 3.5–5.2)
Alkaline Phosphatase: 84 U/L (ref 39–117)
BUN: 13 mg/dL (ref 6–23)
CO2: 30 meq/L (ref 19–32)
Calcium: 9.2 mg/dL (ref 8.4–10.5)
Chloride: 98 meq/L (ref 96–112)
Creatinine, Ser: 1.01 mg/dL (ref 0.40–1.20)
GFR: 53.05 mL/min — ABNORMAL LOW (ref >60.00)
Glucose, Bld: 83 mg/dL (ref 70–99)
Potassium: 4.5 meq/L (ref 3.5–5.1)
Sodium: 137 meq/L (ref 135–145)
Total Bilirubin: 0.5 mg/dL (ref 0.2–1.2)
Total Protein: 6.6 g/dL (ref 6.0–8.3)

## 2024-02-05 LAB — LIPID PANEL
Cholesterol: 168 mg/dL (ref 0–200)
HDL: 53.6 mg/dL (ref >39.00)
LDL Cholesterol: 70 mg/dL (ref 0–99)
NonHDL: 114.59
Total CHOL/HDL Ratio: 3
Triglycerides: 222 mg/dL — ABNORMAL HIGH (ref 0.0–149.0)
VLDL: 44.4 mg/dL — ABNORMAL HIGH (ref 0.0–40.0)

## 2024-02-05 LAB — CBC WITH DIFFERENTIAL/PLATELET
Basophils Absolute: 0 K/uL (ref 0.0–0.1)
Basophils Relative: 0.5 % (ref 0.0–3.0)
Eosinophils Absolute: 0.1 K/uL (ref 0.0–0.7)
Eosinophils Relative: 1.4 % (ref 0.0–5.0)
HCT: 39.3 % (ref 36.0–46.0)
Hemoglobin: 13.2 g/dL (ref 12.0–15.0)
Lymphocytes Relative: 24.5 % (ref 12.0–46.0)
Lymphs Abs: 1.6 K/uL (ref 0.7–4.0)
MCHC: 33.5 g/dL (ref 30.0–36.0)
MCV: 89.6 fl (ref 78.0–100.0)
Monocytes Absolute: 0.9 K/uL (ref 0.1–1.0)
Monocytes Relative: 13.1 % — ABNORMAL HIGH (ref 3.0–12.0)
Neutro Abs: 4.1 K/uL (ref 1.4–7.7)
Neutrophils Relative %: 60.5 % (ref 43.0–77.0)
Platelets: 184 K/uL (ref 150.0–400.0)
RBC: 4.39 Mil/uL (ref 3.87–5.11)
RDW: 13.9 % (ref 11.5–15.5)
WBC: 6.7 K/uL (ref 4.0–10.5)

## 2024-02-05 LAB — HEMOGLOBIN A1C: Hgb A1c MFr Bld: 6.4 % (ref 4.6–6.5)

## 2024-02-05 LAB — TSH: TSH: 1.18 u[IU]/mL (ref 0.35–5.50)

## 2024-02-06 ENCOUNTER — Ambulatory Visit: Payer: Self-pay | Admitting: Family

## 2024-02-07 ENCOUNTER — Other Ambulatory Visit: Payer: Self-pay | Admitting: Family Medicine

## 2024-02-07 DIAGNOSIS — G4700 Insomnia, unspecified: Secondary | ICD-10-CM

## 2024-02-10 NOTE — Progress Notes (Signed)
 [facility] Quality Team Note  Name: Joan [name] Date of Birth: 1946-04-08 MRN: [id number] Date: 02/10/2024  [facility] Quality Team has reviewed this patient's chart, please see recommendations below:  [facility] Quality Other; (CHART REVIEWED FOR KED. ABSTRACTED)

## 2024-02-19 ENCOUNTER — Other Ambulatory Visit: Payer: Self-pay | Admitting: Family Medicine

## 2024-03-10 ENCOUNTER — Other Ambulatory Visit (HOSPITAL_COMMUNITY): Payer: Self-pay

## 2024-03-10 DIAGNOSIS — Z85828 Personal history of other malignant neoplasm of skin: Secondary | ICD-10-CM | POA: Diagnosis not present

## 2024-03-10 DIAGNOSIS — I8311 Varicose veins of right lower extremity with inflammation: Secondary | ICD-10-CM | POA: Diagnosis not present

## 2024-03-10 DIAGNOSIS — L308 Other specified dermatitis: Secondary | ICD-10-CM | POA: Diagnosis not present

## 2024-03-10 DIAGNOSIS — L858 Other specified epidermal thickening: Secondary | ICD-10-CM | POA: Diagnosis not present

## 2024-03-10 DIAGNOSIS — I8312 Varicose veins of left lower extremity with inflammation: Secondary | ICD-10-CM | POA: Diagnosis not present

## 2024-03-11 ENCOUNTER — Telehealth: Payer: Self-pay

## 2024-03-11 DIAGNOSIS — G4700 Insomnia, unspecified: Secondary | ICD-10-CM

## 2024-03-11 MED ORDER — TRAZODONE HCL 50 MG PO TABS: 100.0000 mg | ORAL_TABLET | Freq: Every day | ORAL | 1 refills | Status: DC

## 2024-03-11 NOTE — Telephone Encounter (Signed)
 Copied from CRM [id number]. Topic: General - Other >> Mar 11, 2024  4:16 PM Rosina BIRCH wrote: Reason for CRM: patient called stating the pharmacy will not refill her trazodone  because the script is still saying take one tablet at bedtime but she has ran out. The patient stated the provider told her she could take two but she does not have anymore so the pharmacy need a new script [phone]

## 2024-03-11 NOTE — Telephone Encounter (Signed)
 Chart reviewed, PCP increased trazodone  50mg  to 2 tablets at bedtime at OV on 02/04/24. Rx updated and sent.

## 2024-03-23 ENCOUNTER — Other Ambulatory Visit: Payer: Self-pay | Admitting: Family Medicine

## 2024-03-31 ENCOUNTER — Other Ambulatory Visit (HOSPITAL_COMMUNITY): Payer: Self-pay

## 2024-03-31 DIAGNOSIS — M62830 Muscle spasm of back: Secondary | ICD-10-CM | POA: Diagnosis not present

## 2024-03-31 DIAGNOSIS — M961 Postlaminectomy syndrome, not elsewhere classified: Secondary | ICD-10-CM | POA: Diagnosis not present

## 2024-03-31 DIAGNOSIS — G894 Chronic pain syndrome: Secondary | ICD-10-CM | POA: Diagnosis not present

## 2024-03-31 DIAGNOSIS — Z79891 Long term (current) use of opiate analgesic: Secondary | ICD-10-CM | POA: Diagnosis not present

## 2024-03-31 MED ORDER — OXYCODONE-ACETAMINOPHEN 10-325 MG PO TABS
ORAL_TABLET | ORAL | 0 refills | Status: AC
  Filled 2024-05-20: qty 160, 26d supply, fill #0

## 2024-03-31 MED ORDER — OXYCODONE-ACETAMINOPHEN 10-325 MG PO TABS
ORAL_TABLET | ORAL | 0 refills | Status: AC
  Filled 2024-06-22: qty 160, 26d supply, fill #0

## 2024-04-09 ENCOUNTER — Other Ambulatory Visit: Payer: Self-pay | Admitting: Family Medicine

## 2024-04-09 DIAGNOSIS — R600 Localized edema: Secondary | ICD-10-CM

## 2024-04-15 ENCOUNTER — Other Ambulatory Visit (HOSPITAL_COMMUNITY): Payer: Self-pay

## 2024-05-06 ENCOUNTER — Other Ambulatory Visit: Payer: Self-pay | Admitting: Family Medicine

## 2024-05-06 DIAGNOSIS — K219 Gastro-esophageal reflux disease without esophagitis: Secondary | ICD-10-CM

## 2024-05-20 ENCOUNTER — Other Ambulatory Visit (HOSPITAL_COMMUNITY): Payer: Self-pay

## 2024-06-01 ENCOUNTER — Other Ambulatory Visit (HOSPITAL_COMMUNITY): Payer: Self-pay

## 2024-06-01 MED ORDER — OXYCODONE-ACETAMINOPHEN 10-325 MG PO TABS: 1.0000 | ORAL_TABLET | ORAL | 0 refills | Status: AC | PRN

## 2024-06-01 MED ORDER — OXYCODONE-ACETAMINOPHEN 10-325 MG PO TABS
1.0000 | ORAL_TABLET | ORAL | 0 refills | Status: AC | PRN
  Filled 2024-07-27 (×2): qty 160, 27d supply, fill #0

## 2024-06-03 ENCOUNTER — Other Ambulatory Visit: Payer: Self-pay | Admitting: Family Medicine

## 2024-06-03 DIAGNOSIS — J069 Acute upper respiratory infection, unspecified: Secondary | ICD-10-CM

## 2024-06-17 ENCOUNTER — Other Ambulatory Visit: Payer: Self-pay | Admitting: Family Medicine

## 2024-06-17 DIAGNOSIS — E876 Hypokalemia: Secondary | ICD-10-CM

## 2024-06-22 ENCOUNTER — Other Ambulatory Visit (HOSPITAL_COMMUNITY): Payer: Self-pay

## 2024-06-22 ENCOUNTER — Other Ambulatory Visit: Payer: Self-pay

## 2024-07-14 NOTE — Telephone Encounter (Unsigned)
 Copied from CRM [id number]. Topic: Referral - Question >> Jul 14, 2024  3:04 PM Rea BROCKS wrote: Reason for CRM: Patient has same insurance but they are now wanting her to submit referrals for her appointments. Next Wednesday, Patient has an appointment with her Dermatologist, Dr. Bard Molt.   And, January 19th, Patient has an appointment with Pain Management Oneil Ellen and she needs referrals for both appointments.   [phone] (M)

## 2024-07-27 ENCOUNTER — Other Ambulatory Visit (HOSPITAL_COMMUNITY): Payer: Self-pay

## 2024-07-27 ENCOUNTER — Telehealth (HOSPITAL_BASED_OUTPATIENT_CLINIC_OR_DEPARTMENT_OTHER): Payer: Self-pay | Admitting: Pulmonary Disease

## 2024-07-27 ENCOUNTER — Other Ambulatory Visit: Payer: Self-pay

## 2024-07-27 DIAGNOSIS — R918 Other nonspecific abnormal finding of lung field: Secondary | ICD-10-CM

## 2024-07-27 MED ORDER — OXYCODONE-ACETAMINOPHEN 10-325 MG PO TABS: 1.0000 | ORAL_TABLET | ORAL | 0 refills | Status: AC | PRN

## 2024-07-27 NOTE — Telephone Encounter (Signed)
 Good morning, could you extend the date on this order? Or create a new order? Pt states she does not want to go to another location other than [facility] and availability times are later in the evening. She's not comfortable with that either.

## 2024-07-28 ENCOUNTER — Other Ambulatory Visit: Payer: Self-pay

## 2024-07-28 NOTE — Telephone Encounter (Signed)
 Order has been placed with a three month extension just needs sig from you

## 2024-07-29 ENCOUNTER — Other Ambulatory Visit (HOSPITAL_COMMUNITY): Payer: Self-pay

## 2024-07-29 ENCOUNTER — Other Ambulatory Visit: Payer: Self-pay

## 2024-08-03 ENCOUNTER — Other Ambulatory Visit (HOSPITAL_COMMUNITY)

## 2024-08-05 ENCOUNTER — Other Ambulatory Visit: Payer: Self-pay | Admitting: Family Medicine

## 2024-08-05 DIAGNOSIS — E1165 Type 2 diabetes mellitus with hyperglycemia: Secondary | ICD-10-CM

## 2024-08-05 DIAGNOSIS — G4700 Insomnia, unspecified: Secondary | ICD-10-CM

## 2024-08-06 ENCOUNTER — Encounter: Admitting: Family Medicine

## 2024-08-18 ENCOUNTER — Ambulatory Visit (HOSPITAL_COMMUNITY)

## 2024-09-01 ENCOUNTER — Encounter: Admitting: Family Medicine

## 2024-09-03 ENCOUNTER — Ambulatory Visit (HOSPITAL_BASED_OUTPATIENT_CLINIC_OR_DEPARTMENT_OTHER): Admitting: Pulmonary Disease

## 2024-09-17 ENCOUNTER — Other Ambulatory Visit (HOSPITAL_COMMUNITY)

## 2024-10-06 ENCOUNTER — Ambulatory Visit (HOSPITAL_BASED_OUTPATIENT_CLINIC_OR_DEPARTMENT_OTHER): Admitting: Pulmonary Disease
